# Patient Record
Sex: Female | Born: 1940
Health system: Southern US, Community
[De-identification: ages and names within clinical notes are randomized; demographics above are authoritative.]

## PROBLEM LIST (undated history)

## (undated) DIAGNOSIS — R42 Dizziness and giddiness: Secondary | ICD-10-CM

## (undated) DIAGNOSIS — Z124 Encounter for screening for malignant neoplasm of cervix: Secondary | ICD-10-CM

## (undated) DIAGNOSIS — M1990 Unspecified osteoarthritis, unspecified site: Secondary | ICD-10-CM

## (undated) DIAGNOSIS — L309 Dermatitis, unspecified: Secondary | ICD-10-CM

## (undated) DIAGNOSIS — D494 Neoplasm of unspecified behavior of bladder: Secondary | ICD-10-CM

## (undated) DIAGNOSIS — M542 Cervicalgia: Secondary | ICD-10-CM

## (undated) DIAGNOSIS — C679 Malignant neoplasm of bladder, unspecified: Secondary | ICD-10-CM

## (undated) DIAGNOSIS — I781 Nevus, non-neoplastic: Secondary | ICD-10-CM

## (undated) DIAGNOSIS — R1310 Dysphagia, unspecified: Secondary | ICD-10-CM

## (undated) DIAGNOSIS — R7303 Prediabetes: Secondary | ICD-10-CM

## (undated) DIAGNOSIS — K429 Umbilical hernia without obstruction or gangrene: Secondary | ICD-10-CM

## (undated) DIAGNOSIS — M8580 Other specified disorders of bone density and structure, unspecified site: Secondary | ICD-10-CM

## (undated) DIAGNOSIS — K5790 Diverticulosis of intestine, part unspecified, without perforation or abscess without bleeding: Secondary | ICD-10-CM

## (undated) DIAGNOSIS — M4800 Spinal stenosis, site unspecified: Secondary | ICD-10-CM

## (undated) DIAGNOSIS — N649 Disorder of breast, unspecified: Secondary | ICD-10-CM

## (undated) DIAGNOSIS — F418 Other specified anxiety disorders: Secondary | ICD-10-CM

## (undated) DIAGNOSIS — R002 Palpitations: Secondary | ICD-10-CM

## (undated) DIAGNOSIS — Z972 Presence of dental prosthetic device (complete) (partial): Secondary | ICD-10-CM

## (undated) DIAGNOSIS — M25552 Pain in left hip: Secondary | ICD-10-CM

## (undated) DIAGNOSIS — E782 Mixed hyperlipidemia: Secondary | ICD-10-CM

## (undated) DIAGNOSIS — L409 Psoriasis, unspecified: Secondary | ICD-10-CM

## (undated) DIAGNOSIS — R350 Frequency of micturition: Secondary | ICD-10-CM

## (undated) DIAGNOSIS — M545 Low back pain: Secondary | ICD-10-CM

## (undated) DIAGNOSIS — B019 Varicella without complication: Secondary | ICD-10-CM

## (undated) DIAGNOSIS — I10 Essential (primary) hypertension: Secondary | ICD-10-CM

## (undated) DIAGNOSIS — E663 Overweight: Secondary | ICD-10-CM

## (undated) DIAGNOSIS — R1011 Right upper quadrant pain: Secondary | ICD-10-CM

## (undated) DIAGNOSIS — K219 Gastro-esophageal reflux disease without esophagitis: Secondary | ICD-10-CM

## (undated) DIAGNOSIS — K449 Diaphragmatic hernia without obstruction or gangrene: Secondary | ICD-10-CM

## (undated) DIAGNOSIS — R739 Hyperglycemia, unspecified: Secondary | ICD-10-CM

## (undated) HISTORY — DX: Umbilical hernia without obstruction or gangrene: K42.9

## (undated) HISTORY — DX: Varicella without complication: B01.9

## (undated) HISTORY — PX: EYE SURGERY: SHX253

## (undated) HISTORY — DX: Palpitations: R00.2

## (undated) HISTORY — DX: Gastro-esophageal reflux disease without esophagitis: K21.9

## (undated) HISTORY — DX: Cervicalgia: M54.2

## (undated) HISTORY — DX: Diaphragmatic hernia without obstruction or gangrene: K44.9

## (undated) HISTORY — DX: Dizziness and giddiness: R42

## (undated) HISTORY — DX: Essential (primary) hypertension: I10

## (undated) HISTORY — DX: Dysphagia, unspecified: R13.10

## (undated) HISTORY — DX: Spinal stenosis, site unspecified: M48.00

## (undated) HISTORY — PX: CYSTECTOMY: SUR359

## (undated) HISTORY — PX: ABDOMINAL SURGERY: SHX537

## (undated) HISTORY — DX: Disorder of breast, unspecified: N64.9

## (undated) HISTORY — DX: Prediabetes: R73.03

## (undated) HISTORY — PX: COLONOSCOPY: SHX174

## (undated) HISTORY — DX: Encounter for screening for malignant neoplasm of cervix: Z12.4

## (undated) HISTORY — DX: Mixed hyperlipidemia: E78.2

## (undated) HISTORY — DX: Other specified disorders of bone density and structure, unspecified site: M85.80

## (undated) HISTORY — DX: Other specified anxiety disorders: F41.8

## (undated) HISTORY — DX: Frequency of micturition: R35.0

## (undated) HISTORY — DX: Hyperglycemia, unspecified: R73.9

## (undated) HISTORY — PX: BREAST CYST ASPIRATION: SHX578

## (undated) HISTORY — DX: Right upper quadrant pain: R10.11

## (undated) HISTORY — DX: Low back pain: M54.5

## (undated) HISTORY — PX: CATARACT EXTRACTION, BILATERAL: SHX1313

## (undated) HISTORY — DX: Overweight: E66.3

## (undated) HISTORY — DX: Pain in left hip: M25.552

## (undated) HISTORY — DX: Dermatitis, unspecified: L30.9

---

## 1968-08-27 HISTORY — PX: TUBAL LIGATION: SHX77

## 1968-08-27 HISTORY — PX: BREAST BIOPSY: SHX20

## 1978-08-27 HISTORY — PX: BREAST BIOPSY: SHX20

## 1998-05-10 ENCOUNTER — Ambulatory Visit (HOSPITAL_COMMUNITY): Admission: RE | Admit: 1998-05-10 | Discharge: 1998-05-10 | Payer: Self-pay

## 1999-11-30 ENCOUNTER — Other Ambulatory Visit: Admission: RE | Admit: 1999-11-30 | Discharge: 1999-11-30 | Payer: Self-pay | Admitting: Obstetrics and Gynecology

## 1999-12-06 ENCOUNTER — Encounter: Payer: Self-pay | Admitting: Obstetrics and Gynecology

## 1999-12-06 ENCOUNTER — Ambulatory Visit (HOSPITAL_COMMUNITY): Admission: RE | Admit: 1999-12-06 | Discharge: 1999-12-06 | Payer: Self-pay | Admitting: Obstetrics and Gynecology

## 2000-01-31 ENCOUNTER — Ambulatory Visit (HOSPITAL_COMMUNITY): Admission: RE | Admit: 2000-01-31 | Discharge: 2000-01-31 | Payer: Self-pay | Admitting: Gastroenterology

## 2001-01-10 ENCOUNTER — Other Ambulatory Visit: Admission: RE | Admit: 2001-01-10 | Discharge: 2001-01-10 | Payer: Self-pay | Admitting: Obstetrics and Gynecology

## 2002-01-12 ENCOUNTER — Other Ambulatory Visit: Admission: RE | Admit: 2002-01-12 | Discharge: 2002-01-12 | Payer: Self-pay | Admitting: Obstetrics and Gynecology

## 2003-04-15 ENCOUNTER — Other Ambulatory Visit: Admission: RE | Admit: 2003-04-15 | Discharge: 2003-04-15 | Payer: Self-pay | Admitting: Obstetrics and Gynecology

## 2004-10-23 ENCOUNTER — Other Ambulatory Visit: Admission: RE | Admit: 2004-10-23 | Discharge: 2004-10-23 | Payer: Self-pay | Admitting: Obstetrics and Gynecology

## 2004-10-26 ENCOUNTER — Encounter: Admission: RE | Admit: 2004-10-26 | Discharge: 2004-10-26 | Payer: Self-pay | Admitting: Obstetrics and Gynecology

## 2005-02-14 ENCOUNTER — Ambulatory Visit (HOSPITAL_COMMUNITY): Admission: RE | Admit: 2005-02-14 | Discharge: 2005-02-14 | Payer: Self-pay | Admitting: Gastroenterology

## 2005-02-14 LAB — HM COLONOSCOPY

## 2005-11-21 ENCOUNTER — Encounter: Admission: RE | Admit: 2005-11-21 | Discharge: 2005-11-21 | Payer: Self-pay | Admitting: Obstetrics and Gynecology

## 2006-12-26 ENCOUNTER — Encounter: Admission: RE | Admit: 2006-12-26 | Discharge: 2006-12-26 | Payer: Self-pay | Admitting: Obstetrics and Gynecology

## 2007-03-17 IMAGING — MG MM MAMMO SCREENING
1 series · 1 of 1 positions shown · non-contrast
Comparison: none

SCREENING MAMMOGRAM:
There is a  dense fibroglandular pattern.  No masses or malignant type calcifications are 
identified.  Compared with prior studies.

[R MLO]
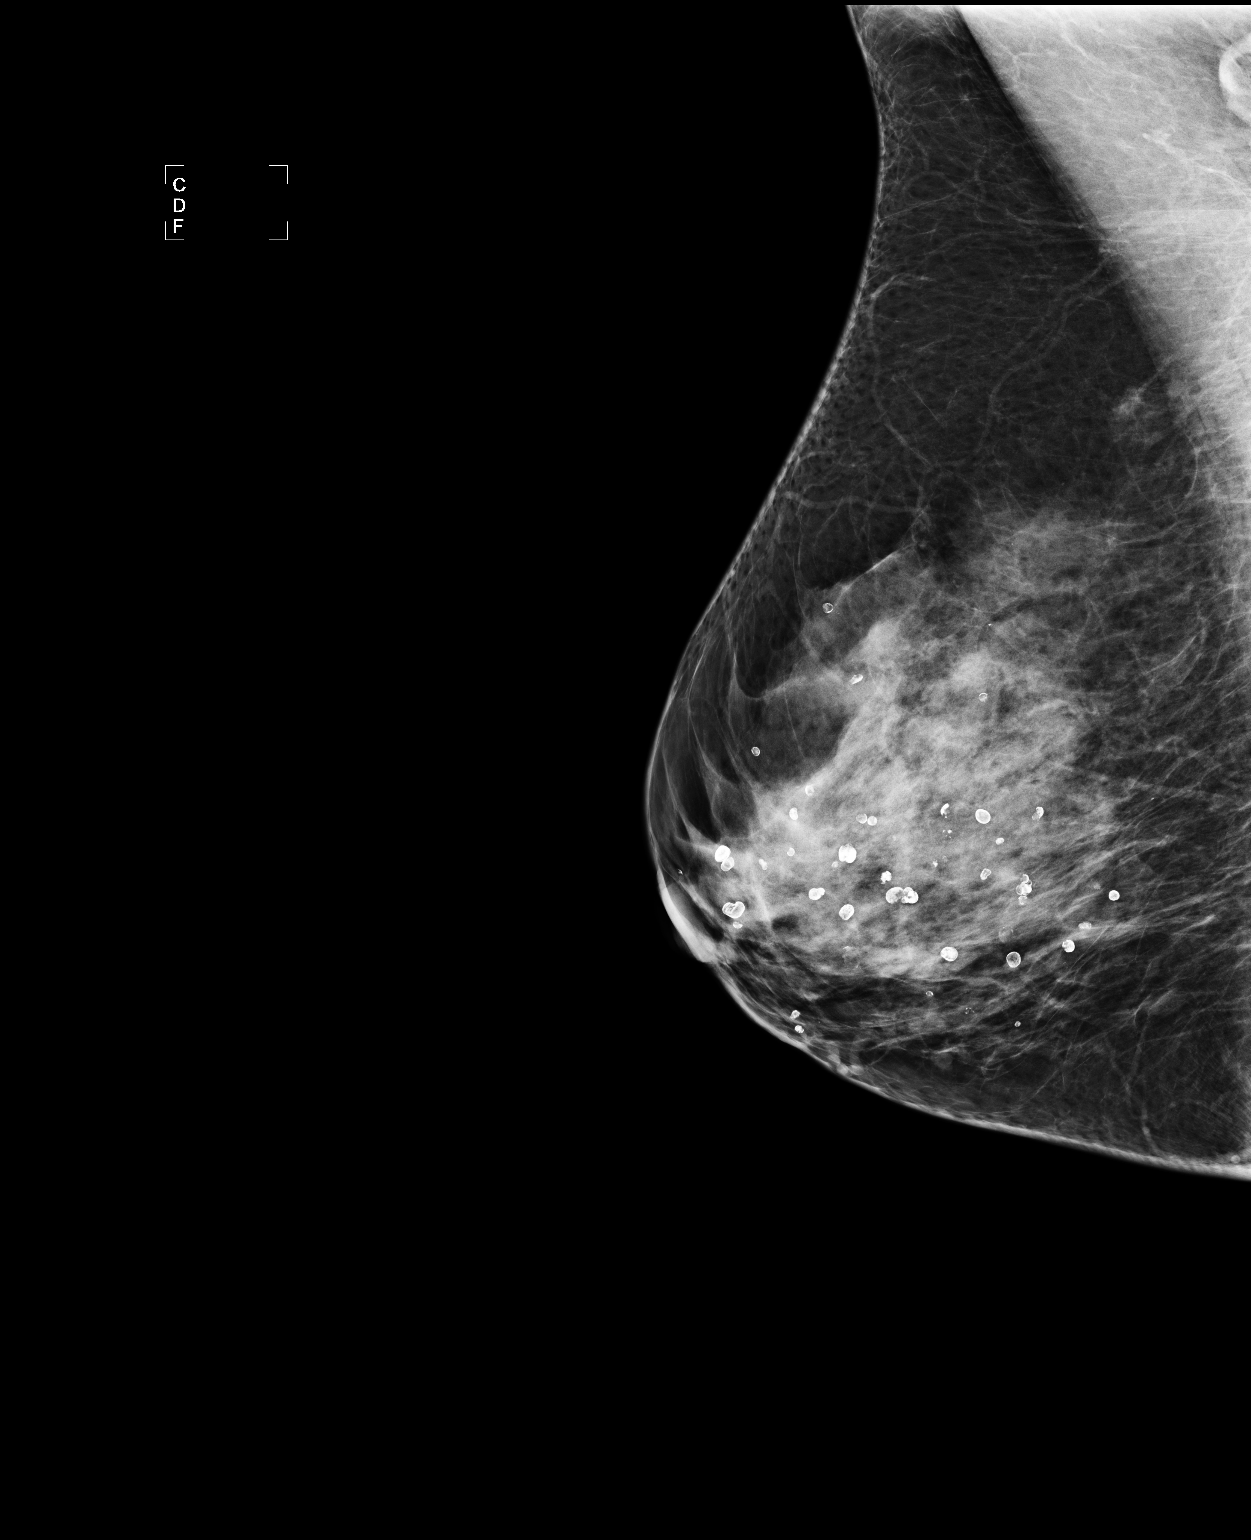

[1 of 1 positions shown; findings below may reference images not displayed]

IMPRESSION: No specific mammographic evidence of malignancy.  Next screening mammogram is recommended in one 
year.

ASSESSMENT: Negative - BI-RADS 1

Screening mammogram in 1 year.

## 2007-03-28 LAB — CONVERTED CEMR LAB

## 2007-04-18 ENCOUNTER — Emergency Department (HOSPITAL_COMMUNITY): Admission: EM | Admit: 2007-04-18 | Discharge: 2007-04-18 | Payer: Self-pay | Admitting: Emergency Medicine

## 2007-05-15 ENCOUNTER — Encounter: Admission: RE | Admit: 2007-05-15 | Discharge: 2007-05-15 | Payer: Self-pay | Admitting: Family Medicine

## 2007-10-13 ENCOUNTER — Encounter: Admission: RE | Admit: 2007-10-13 | Discharge: 2007-10-13 | Payer: Self-pay | Admitting: Family Medicine

## 2007-10-13 ENCOUNTER — Ambulatory Visit: Payer: Self-pay | Admitting: Family Medicine

## 2007-10-13 DIAGNOSIS — I10 Essential (primary) hypertension: Secondary | ICD-10-CM

## 2007-10-13 HISTORY — DX: Essential (primary) hypertension: I10

## 2007-10-30 ENCOUNTER — Ambulatory Visit: Payer: Self-pay | Admitting: Family Medicine

## 2007-10-31 ENCOUNTER — Telehealth: Payer: Self-pay | Admitting: Family Medicine

## 2007-11-13 ENCOUNTER — Ambulatory Visit: Payer: Self-pay | Admitting: Family Medicine

## 2008-02-24 ENCOUNTER — Ambulatory Visit: Payer: Self-pay | Admitting: Family Medicine

## 2008-02-24 DIAGNOSIS — M79609 Pain in unspecified limb: Secondary | ICD-10-CM | POA: Insufficient documentation

## 2008-02-27 LAB — CONVERTED CEMR LAB
Folate: 15.4 ng/mL
HCT: 44.2 % (ref 36.0–46.0)
Hemoglobin: 14.2 g/dL (ref 12.0–15.0)
MCHC: 32.1 g/dL (ref 30.0–36.0)
MCV: 94.2 fL (ref 78.0–100.0)
Platelets: 241 10*3/uL (ref 150–400)
RBC: 4.69 M/uL (ref 3.87–5.11)
RDW: 14 % (ref 11.5–15.5)
TSH: 1.975 microintl units/mL (ref 0.350–5.50)
Vit D, 1,25-Dihydroxy: 29 — ABNORMAL LOW (ref 30–89)
Vitamin B-12: 683 pg/mL (ref 211–911)
WBC: 5.1 10*3/uL (ref 4.0–10.5)

## 2008-03-18 ENCOUNTER — Ambulatory Visit: Payer: Self-pay | Admitting: Family Medicine

## 2008-03-18 DIAGNOSIS — F39 Unspecified mood [affective] disorder: Secondary | ICD-10-CM | POA: Insufficient documentation

## 2008-03-18 DIAGNOSIS — R002 Palpitations: Secondary | ICD-10-CM | POA: Insufficient documentation

## 2008-03-18 LAB — CONVERTED CEMR LAB: Blood Glucose, Fasting: 102 mg/dL

## 2008-05-18 ENCOUNTER — Encounter: Admission: RE | Admit: 2008-05-18 | Discharge: 2008-05-18 | Payer: Self-pay | Admitting: Family Medicine

## 2008-05-26 ENCOUNTER — Other Ambulatory Visit: Admission: RE | Admit: 2008-05-26 | Discharge: 2008-05-26 | Payer: Self-pay | Admitting: Family Medicine

## 2008-05-26 ENCOUNTER — Encounter: Payer: Self-pay | Admitting: Family Medicine

## 2008-05-26 ENCOUNTER — Ambulatory Visit: Payer: Self-pay | Admitting: Family Medicine

## 2008-05-27 LAB — CONVERTED CEMR LAB
ALT: 13 units/L (ref 0–35)
AST: 20 units/L (ref 0–37)
Albumin: 4.3 g/dL (ref 3.5–5.2)
Alkaline Phosphatase: 70 units/L (ref 39–117)
BUN: 8 mg/dL (ref 6–23)
CO2: 24 meq/L (ref 19–32)
Calcium: 9.7 mg/dL (ref 8.4–10.5)
Chloride: 104 meq/L (ref 96–112)
Cholesterol: 173 mg/dL (ref 0–200)
Creatinine, Ser: 0.92 mg/dL (ref 0.40–1.20)
Glucose, Bld: 103 mg/dL — ABNORMAL HIGH (ref 70–99)
HDL: 62 mg/dL (ref >39)
LDL Cholesterol: 94 mg/dL (ref 0–99)
Potassium: 4 meq/L (ref 3.5–5.3)
Sodium: 141 meq/L (ref 135–145)
TSH: 1.609 microintl units/mL (ref 0.350–4.50)
Total Bilirubin: 0.8 mg/dL (ref 0.3–1.2)
Total CHOL/HDL Ratio: 2.8
Total Protein: 6.8 g/dL (ref 6.0–8.3)
Triglycerides: 87 mg/dL (ref <150)
VLDL: 17 mg/dL (ref 0–40)

## 2008-08-27 LAB — HM PAP SMEAR

## 2008-08-29 ENCOUNTER — Emergency Department (HOSPITAL_BASED_OUTPATIENT_CLINIC_OR_DEPARTMENT_OTHER): Admission: EM | Admit: 2008-08-29 | Discharge: 2008-08-29 | Payer: Self-pay | Admitting: Emergency Medicine

## 2008-09-10 ENCOUNTER — Ambulatory Visit: Payer: Self-pay | Admitting: Family Medicine

## 2008-09-13 ENCOUNTER — Encounter: Payer: Self-pay | Admitting: Family Medicine

## 2008-09-13 ENCOUNTER — Telehealth: Payer: Self-pay | Admitting: Family Medicine

## 2008-09-13 ENCOUNTER — Ambulatory Visit: Payer: Self-pay | Admitting: *Deleted

## 2008-09-13 ENCOUNTER — Ambulatory Visit (HOSPITAL_COMMUNITY): Admission: RE | Admit: 2008-09-13 | Discharge: 2008-09-13 | Payer: Self-pay | Admitting: Family Medicine

## 2008-09-22 ENCOUNTER — Telehealth: Payer: Self-pay | Admitting: Family Medicine

## 2008-09-24 ENCOUNTER — Telehealth: Payer: Self-pay | Admitting: Family Medicine

## 2008-09-27 ENCOUNTER — Telehealth: Payer: Self-pay | Admitting: Family Medicine

## 2008-10-07 ENCOUNTER — Ambulatory Visit: Payer: Self-pay | Admitting: Family Medicine

## 2008-10-07 DIAGNOSIS — I8310 Varicose veins of unspecified lower extremity with inflammation: Secondary | ICD-10-CM | POA: Insufficient documentation

## 2008-10-07 DIAGNOSIS — E663 Overweight: Secondary | ICD-10-CM | POA: Insufficient documentation

## 2008-10-07 DIAGNOSIS — E669 Obesity, unspecified: Secondary | ICD-10-CM | POA: Insufficient documentation

## 2008-10-07 HISTORY — DX: Overweight: E66.3

## 2008-10-14 ENCOUNTER — Encounter: Payer: Self-pay | Admitting: Family Medicine

## 2008-10-18 ENCOUNTER — Encounter: Payer: Self-pay | Admitting: Family Medicine

## 2008-11-08 ENCOUNTER — Telehealth: Payer: Self-pay | Admitting: Family Medicine

## 2008-11-10 ENCOUNTER — Telehealth: Payer: Self-pay | Admitting: Family Medicine

## 2008-12-09 ENCOUNTER — Telehealth (INDEPENDENT_AMBULATORY_CARE_PROVIDER_SITE_OTHER): Payer: Self-pay | Admitting: *Deleted

## 2009-02-05 IMAGING — CR DG CHEST 2V
2 series · 2 of 2 positions shown · non-contrast
Comparison: None.

CLINICAL DATA: Cough.
 CHEST - 2 VIEW:

[view not recorded (1 of 2)]
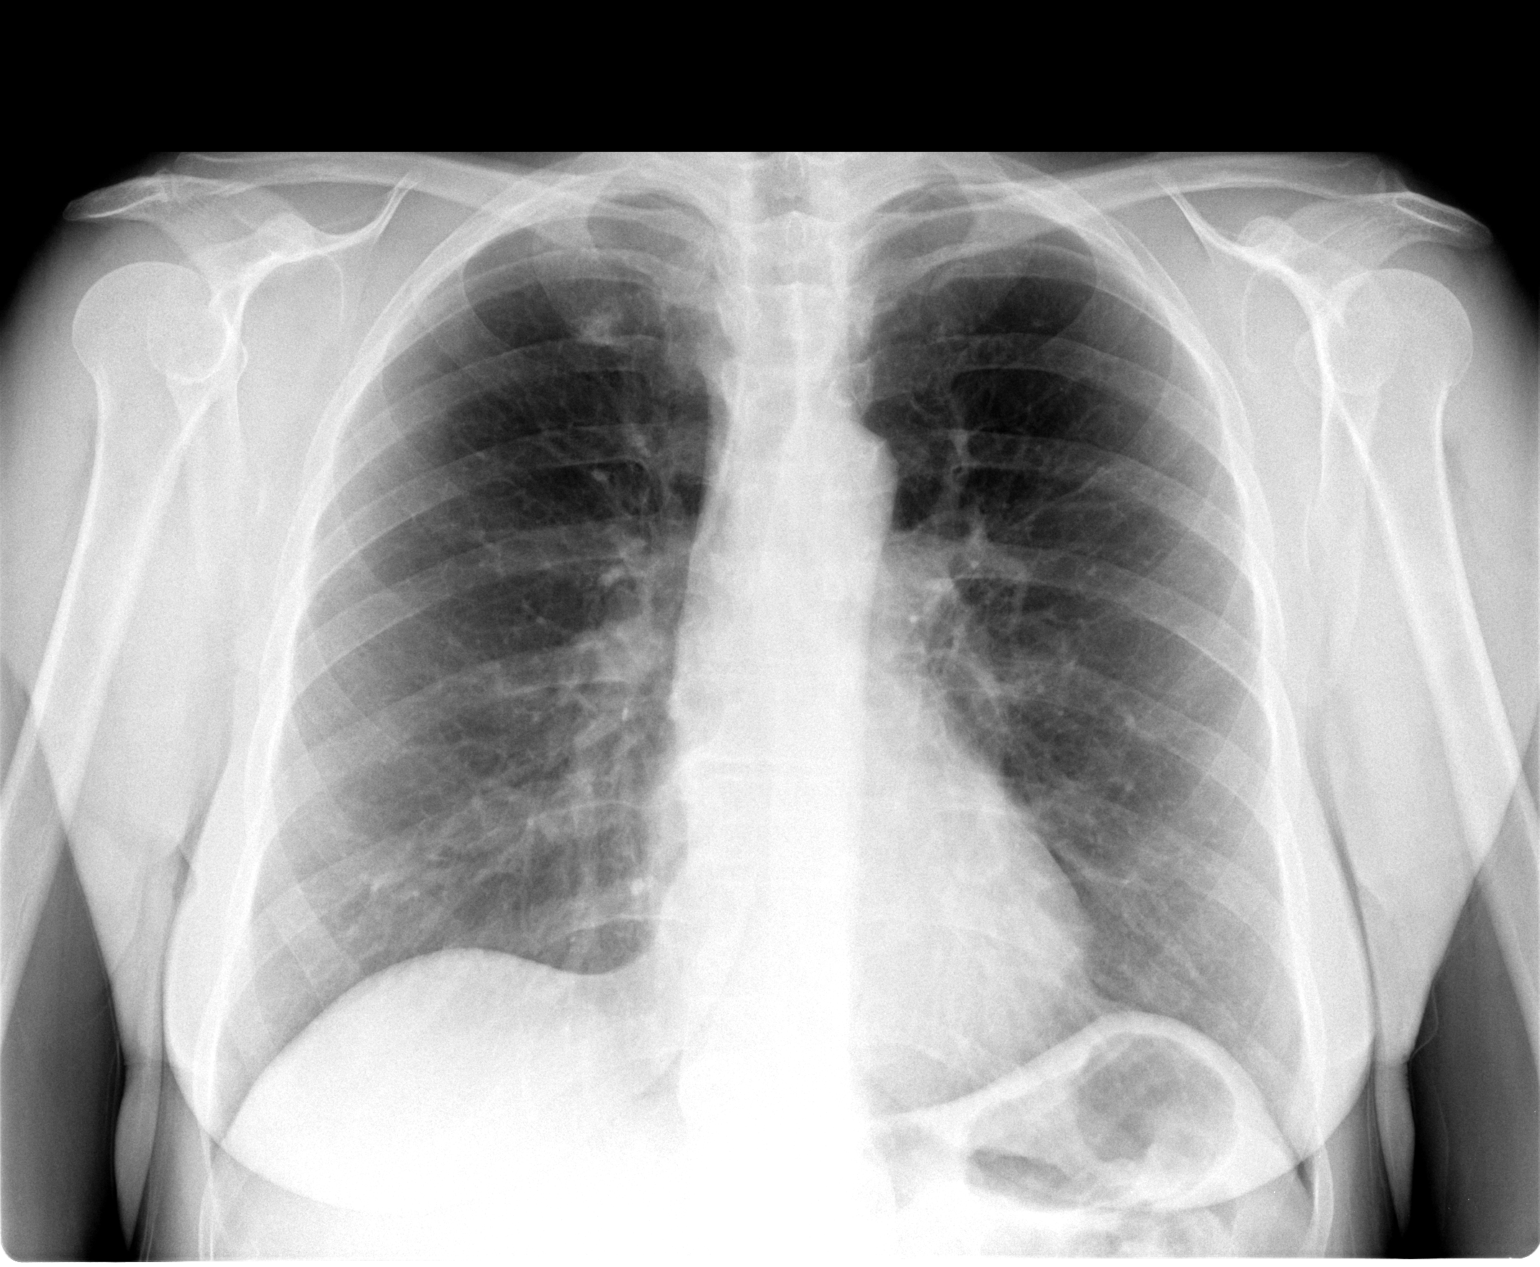

[view not recorded (2 of 2)]
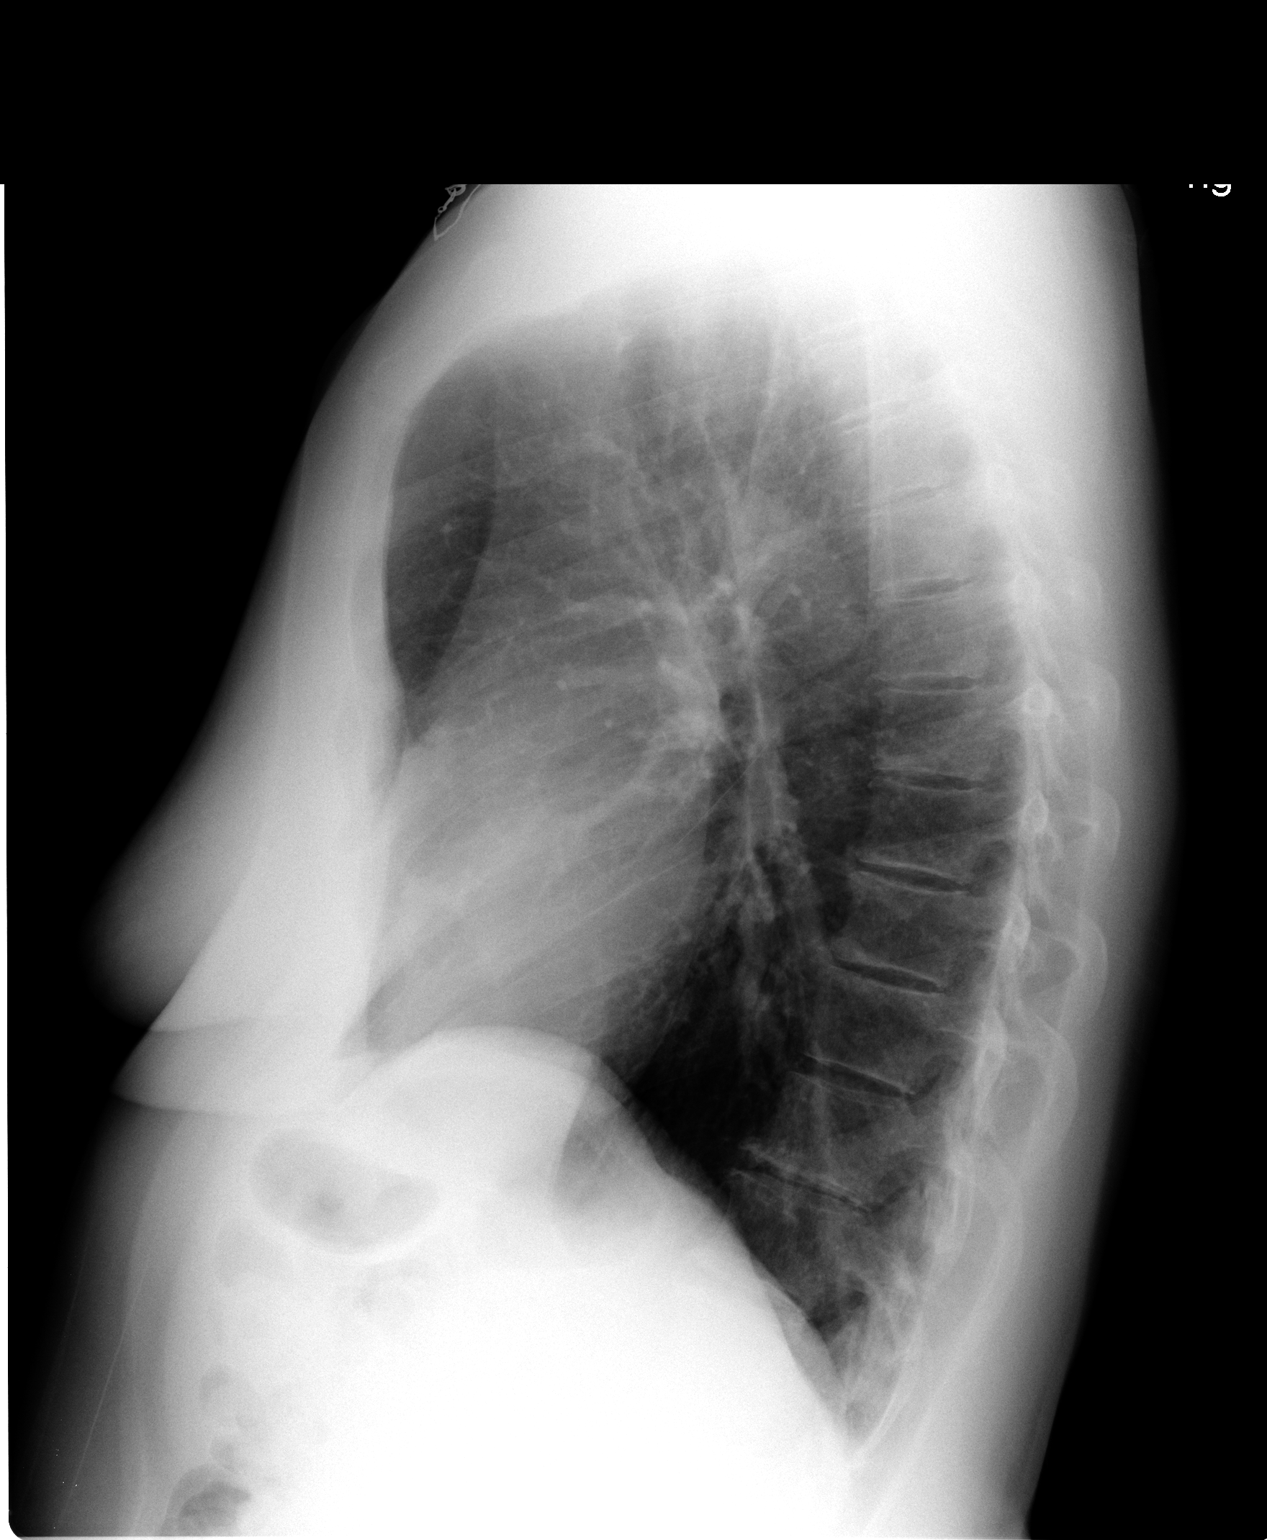

[2 of 2 positions shown; findings below may reference images not displayed]

FINDINGS: Trachea is midline.  Heart size normal.  Lungs are clear.  No pleural fluid.   Mild pectus deformity.
IMPRESSION: No acute findings.

## 2009-05-23 ENCOUNTER — Encounter: Admission: RE | Admit: 2009-05-23 | Discharge: 2009-05-23 | Payer: Self-pay | Admitting: Family Medicine

## 2009-06-07 ENCOUNTER — Ambulatory Visit: Payer: Self-pay | Admitting: Family Medicine

## 2009-06-07 DIAGNOSIS — M899 Disorder of bone, unspecified: Secondary | ICD-10-CM | POA: Insufficient documentation

## 2009-06-07 DIAGNOSIS — M949 Disorder of cartilage, unspecified: Secondary | ICD-10-CM

## 2009-06-07 DIAGNOSIS — F418 Other specified anxiety disorders: Secondary | ICD-10-CM | POA: Insufficient documentation

## 2009-06-07 HISTORY — DX: Other specified anxiety disorders: F41.8

## 2009-06-08 LAB — CONVERTED CEMR LAB
ALT: 14 units/L (ref 0–35)
AST: 19 units/L (ref 0–37)
Albumin: 4.2 g/dL (ref 3.5–5.2)
Alkaline Phosphatase: 69 units/L (ref 39–117)
BUN: 15 mg/dL (ref 6–23)
CO2: 25 meq/L (ref 19–32)
Calcium: 10.1 mg/dL (ref 8.4–10.5)
Chloride: 107 meq/L (ref 96–112)
Creatinine, Ser: 0.8 mg/dL (ref 0.40–1.20)
Glucose, Bld: 78 mg/dL (ref 70–99)
Potassium: 4.3 meq/L (ref 3.5–5.3)
Sodium: 142 meq/L (ref 135–145)
TSH: 2.806 microintl units/mL (ref 0.350–4.500)
Total Bilirubin: 0.8 mg/dL (ref 0.3–1.2)
Total Protein: 6.4 g/dL (ref 6.0–8.3)

## 2009-06-28 ENCOUNTER — Encounter: Admission: RE | Admit: 2009-06-28 | Discharge: 2009-06-28 | Payer: Self-pay | Admitting: Family Medicine

## 2009-06-28 ENCOUNTER — Telehealth: Payer: Self-pay | Admitting: Family Medicine

## 2009-06-28 ENCOUNTER — Ambulatory Visit: Payer: Self-pay | Admitting: Family Medicine

## 2009-08-10 ENCOUNTER — Ambulatory Visit: Payer: Self-pay | Admitting: Family Medicine

## 2010-02-07 ENCOUNTER — Ambulatory Visit: Payer: Self-pay | Admitting: Family Medicine

## 2010-03-21 ENCOUNTER — Telehealth: Payer: Self-pay | Admitting: Family Medicine

## 2010-06-07 ENCOUNTER — Encounter: Admission: RE | Admit: 2010-06-07 | Discharge: 2010-06-07 | Payer: Self-pay | Admitting: Family Medicine

## 2010-06-09 LAB — HM MAMMOGRAPHY: HM Mammogram: NORMAL

## 2010-07-13 ENCOUNTER — Telehealth: Payer: Self-pay | Admitting: Family Medicine

## 2010-09-26 NOTE — Assessment & Plan Note (Signed)
Summary: 6 WEEK FU Mood   Vital Signs:  Patient profile:   70 year old female Height:      65.1 inches Weight:      180 pounds Pulse rate:   69 / minute BP sitting:   130 / 73  (left arm) Cuff size:   regular  Vitals Entered By: Kathlene November (August 10, 2009 10:13 AM) CC: follow-up BP   Primary Care Provider:  Linford Arnold, C  CC:  follow-up BP.  History of Present Illness: Did go up  to 1.5 tabs but didn't feel it helped with her mood. Overall feels better on the medication but doesn't feel like the increase to 30mg  made a difference so he dropped back down to 20mg .  Did tolerated the higher dose with no side effects.  Still really coping his her social situation. NOt interested in counseling.    Current Medications (verified): 1)  Alprazolam 0.5 Mg Tabs (Alprazolam) .[name].. 1-2 Tabs By Mouth Daily 2)  Cozaar 50 Mg Tabs (Losartan Potassium) .... Take 1 Tablet By Mouth Once A Day (Generic Please) 3)  Vitamin D 60454 Unit  Caps (Ergocalciferol) .... One By Mouth Once A Week For 6 Months. 4)  Citalopram Hydrobromide 20 Mg Tabs (Citalopram Hydrobromide) .... Take 1 Tablet By Mouth Once A Day  Allergies (verified): 1)  Hydrochlorothiazide 2)  Lisinopril 3)  * Bisphosphonates.  Comments:  Nurse/Medical Assistant: The patient's medications and allergies were reviewed with the patient and were updated in the Medication and Allergy Lists. Kathlene November (August 10, 2009 10:13 AM)  Physical Exam  General:  Well-developed,well-nourished,in no acute distress; alert,appropriate and cooperative throughout examination Head:  Normocephalic and atraumatic without obvious abnormalities. No apparent alopecia or balding. Lungs:  Normal respiratory effort, chest expands symmetrically. Lungs are clear to auscultation, no crackles or wheezes. Heart:  Normal rate and regular rhythm. S1 and S2 normal without gallop, murmur, click, rub or other extra sounds. Skin:  no rashes.   Psych:  Cognition and  judgment appear intact. Alert and cooperative with normal attention span and concentration. No apparent delusions, illusions, hallucinations   Impression & Recommendations:  Problem # 1:  ESSENTIAL HYPERTENSION, BENIGN (ICD-401.1)  Looks great.  Her updated medication list for this problem includes:    Cozaar 50 Mg Tabs (Losartan potassium) .[name]... Take 1 tablet by mouth once a day (generic please)  BP today: 130/73 Prior BP: 129/66 (06/28/2009)  Prior 10 Yr Risk Heart Disease: 5 % (06/28/2009)  Labs Reviewed: K+: 4.3 (06/07/2009) Creat: : 0.80 (06/07/2009)   Chol: 173 (05/26/2008)   HDL: 62 (05/26/2008)   LDL: 94 (05/26/2008)   TG: 87 (05/26/2008)  Problem # 2:  DEPRESSION (ICD-311) Discussed  trial of increasing to 40mg  since toelrated the 30 mg well. Explained that I really want the med to really benefit her so really want to try to reduce as many of her sxs as possible.   Fu in one moths. Also reviewed that med has to be tapered when she is ready to stop it.  She is aware.  She hopes she can come off it soon, but I did encourage her to take it slowly as coming off too early can cause her sxs of depression to come back. Didn't repeat in her PHQ-9 today.  Her updated medication list for this problem includes:    Alprazolam 0.5 Mg Tabs (Alprazolam) .[name]... 1-2 tabs by mouth daily    Citalopram Hydrobromide 20 Mg Tabs (Citalopram hydrobromide) .[name]... Take 1 tablet  by mouth once a day  Complete Medication List: 1)  Alprazolam 0.5 Mg Tabs (Alprazolam) .[name].. 1-2 tabs by mouth daily 2)  Cozaar 50 Mg Tabs (Losartan potassium) .... Take 1 tablet by mouth once a day (generic please) 3)  Vitamin D [address] Unit Caps (Ergocalciferol) .... One by mouth once a week for 6 months. 4)  Citalopram Hydrobromide 20 Mg Tabs (Citalopram hydrobromide) .... Take 1 tablet by mouth once a day

## 2010-09-26 NOTE — Progress Notes (Signed)
  Phone Note Refill Request   Refills Requested: Medication #1:  ALPRAZOLAM 0.5 MG TABS 1-2 tabs by mouth daily  Method Requested: Fax to Local Pharmacy Initial call taken by: Avon Gully CMA, Duncan Dull),  July 13, 2010 2:44 PM [facility]  [phone]*    Prescriptions: ALPRAZOLAM 0.5 MG TABS (ALPRAZOLAM) 1-2 tabs by mouth daily  #60 x 0   Entered and Authorized by:   Avon Gully CMA, (AAMA)   Signed by:   Avon Gully CMA, (AAMA) on 07/13/2010   Method used:   Printed then faxed to ...       [facility]  [address] [phone][facility] ([facility]ail)       [address] [place]  [address]       Ph: [phone] or [phone]       Fax: [phone]   RxID:   [id number]   Appended Document:     Clinical Lists Changes  Medications: Rx of ALPRAZOLAM 0.5 MG TABS (ALPRAZOLAM) 1-2 tabs by mouth daily;  #60 x 0;  Signed;  Entered by: Avon Gully CMA, (AAMA);  Authorized by: Nani Gasser MD;  Method used: [facility] [address] [place], [place], [place]  [address], Ph: [phone] or [phone], Fax: [phone]    Prescriptions: ALPRAZOLAM 0.5 MG TABS (ALPRAZOLAM) 1-2 tabs by mouth daily  #60 x 0   Entered by:   Avon Gully CMA, (AAMA)   Authorized by:   Nani Gasser MD   Signed by:   Avon Gully CMA, (AAMA) on 07/13/2010   Method used:   Printed then faxed to ...       [facility]  [address] [phone]* (retail)       [address]       [place], [place]  [address]       Ph: [phone] or [phone]       Fax: [phone]   RxID:   [id number]

## 2010-09-26 NOTE — Assessment & Plan Note (Signed)
Summary: Christina [name]; BP can't regulate it   Vital Signs:  Patient Profile:   70 Years Old Female Height:     65.1 inches Weight:      179 pounds Pulse rate:   75 / minute BP sitting:   129 / 69  (left arm) Cuff size:   large  Vitals Entered By: Kathlene November (October 13, 2007 10:50 AM)                 PCP:  Cipriano Bunker  Chief Complaint:  NP- BP has been elevated at home. c/o dry cough alot.  History of Present Illness: [facility] laste [facility] It was 210/105 checked by the nurse.  Then went to ED. Then started on HCT and followed up with her MD.  Then started lisinopril and it caused cough.  Up until Friday BP 130-170.  Then over the weekend it was much better. Has been on Norvasc since November.  No weight loss. Still have a slight cough. Sometimes cough so hard her ribs hurt.  Dry.  No other ENT allergy symptoms. GEts HA when works out in the yard.  So was started on Allegra.  Does know she is salt sentive.   TAking Xanaxfor insomnia.  Takes half a Celexa for anxiety.   GEts CP and burning so takes Aciphex maybe once or twice every couple of months.   Wakes up every night with her hands tingling. Hands hurt just at night.  Occ wakes her up from sleep. She is side sleeper.  Happens nighlty.  Startedin the falll.   No pain or weakness during the day.  Cough started in the Fall around the same time as the elevated BP.  Pts says cough improve after stopped the ACE but it has still lingered. It is mild.  Denies any current SOB, CP or allergic symptoms. Mother died of Lung Ca. Last CXR in the summer/early fall.     Current Allergies: No known allergies   Past Surgical History:    Breast bx in 1960s adn 70s right and left    Tubal ligation 1970s    Cyst removed from abdomen 1980s   Family History:    Father with alcoholism    Mother wiht lung Ca    Sister with uterine Ca    Sister with DM, HTN    GM with stroke.   Social History:    REtired.   Widowed.      Former Smoker    Alcohol use-no    Drug use-no    Regular exercise-yes   Risk Factors:  Tobacco use:  quit    Year quit:  1999 Drug use:  no HIV high-risk behavior:  no Caffeine use:  3 drinks per day Alcohol use:  no Exercise:  yes    Times per week:  3    Type:  Walking   Family History Risk Factors:    Family History of MI in females < 62 years old:  no    Family History of MI in males < 33 years old:  no  Colonoscopy History:     Date of Last Colonoscopy:  08/27/2006    Results:  Unknown    Review of Systems       No fever/sweats/weakness, unexplained weight loss/gain.  + vison changes.  No difficulty hearing/ringing in ears, hay fever/allergies.  No chest pain/discomfort, palpitations.  No Br lump/nipple discharge.  + cough/wheeze.  No blood in BM,  nausea/vomiting/diarrhea.  No nighttime urination, leaking urine, unusual vaginal bleeding, discharge (penis or vagina).  No muscle/joint pain. No rash, change in mole.  No HA, memory loss.  No anxiety.  + sleep d/o.  No  depression.  No easy bruising/bleeding, unexplained lump    Physical Exam  General:     Well-developed,well-nourished,in no acute distress; alert,appropriate and cooperative throughout examination Head:     Normocephalic and atraumatic without obvious abnormalities. No apparent alopecia or balding. Eyes:     No corneal or conjunctival inflammation noted. EOMI. Perrla.  Ears:     External ear exam shows no significant lesions or deformities.  Otoscopic examination reveals clear canals, tympanic membranes are intact bilaterally without bulging, retraction, inflammation or discharge. Hearing is grossly normal bilaterally. Nose:     External nasal examination shows no deformity or inflammation. Nasal mucosa are pink and moist without lesions or exudates. Mouth:     Oral mucosa and oropharynx without lesions or exudates.  Teeth in good repair. Neck:     No deformities, masses, or tenderness  noted.  No TM.  Lungs:     Normal respiratory effort, chest expands symmetrically. Lungs are clear to auscultation, no crackles or wheezes. Heart:     Normal rate and regular rhythm. S1 and S2 normal without gallop, murmur, click, rub or other extra sounds. Pulses:     Radial 2+  Skin:     no rashes.   Cervical Nodes:     No lymphadenopathy noted    Impression & Recommendations:  Problem # 1:  ESSENTIAL HYPERTENSION, BENIGN (ICD-401.1) Looks well controlled today but since getting elevated numbers at home will increase Norvasc.  ACE caused cough. Can't afford ARBs.  Hasn't tried beta blocker.  Thinks she may have had her thyroid checked w/ her CPE.  avoid excess salt. Continue to watch her weight.  Her updated medication list for this problem includes:    Hydrochlorothiazide 25 Mg Tabs (Hydrochlorothiazide) .[name]... Take one tablet by mouth once a day    Norvasc 10 Mg Tabs (Amlodipine besylate) .[name]... Take 1 tablet by mouth once a day   Problem # 2:  COUGH (ICD-786.2) Better since off ACE but peristnat.  Since 2 weeks of coug without etiology and former smoker will check CXR.  Not likely allergies. Consider Reflux as cause.  Exam is normal.  Only takes her PPI every few months.  Orders: T-Chest x-ray, 2 views (71020)   Complete Medication List: 1)  Hydrochlorothiazide 25 Mg Tabs (Hydrochlorothiazide) .... Take one tablet by mouth once a day 2)  Norvasc 10 Mg Tabs (Amlodipine besylate) .... Take 1 tablet by mouth once a day 3)  Allegra 180 Mg Tabs (Fexofenadine hcl) .... Take one tablet by mouth once a day as needed 4)  Aciphex 20 Mg Tbec (Rabeprazole sodium) .... Take one tablet by mouth once a day as needed 5)  Celexa 20 Mg Tabs (Citalopram hydrobromide) .... Take 1/2 tablet by mouth once a day 6)  Alprazolam 0.5 Mg Tb24 (Alprazolam) .... Take one tablet by mouth once a day     Prescriptions: NORVASC 10 MG  TABS (AMLODIPINE BESYLATE) Take 1 tablet by mouth once a day  #30 x 2    Entered and Authorized by:   Nani Gasser MD   Signed by:   Nani Gasser MD on 10/13/2007   Method used:   Electron[facility] to ...       [facility]  [address] [phone][facility]  [address]       [place], [place]  [address]       Ph: [phone] or [phone]       Fax: [phone]   RxID:   [id number]  ]

## 2010-09-26 NOTE — Consult Note (Signed)
Summary: [facility]  [facility]   Imported By: Lanelle Bal 11/06/2008 10:02:51  _____________________________________________________________________  External Attachment:    Type:   Image     Comment:   External Document

## 2010-09-26 NOTE — Assessment & Plan Note (Signed)
Summary: Varicose Veins, HTN   Vital Signs:  Patient Profile:   70 Years Old Female Height:     65.1 inches Weight:      188 pounds Pulse rate:   78 / minute BP sitting:   129 / 67  (left arm) Cuff size:   regular  Vitals Entered By: Kathlene November (October 07, 2008 9:28 AM)                 PCP:  Cipriano Bunker  Chief Complaint:  right leg pain from the spider veins.  History of Present Illness: Still having pain in the right upper leg above the knee.  Painful along the edge. Has notw been a month of pain and swelling. Often painful after sitting for a period of time.  Feels like a stabbing pain.    Concerned about her weight.  Has been exercising adn has changed her diet.  Was trying to stay around 2000 calories for hteh alst month and has gained 2 pounds.   Benicar has gone up in price. Wants to know if there is anything cheaper. She has a cough with ACEi.     Current Allergies: HYDROCHLOROTHIAZIDE LISINOPRIL      Physical Exam  General:     Well-developed,well-nourished,in no acute distress; alert,appropriate and cooperative throughout examination Head:     Normocephalic and atraumatic without obvious abnormalities. No apparent alopecia or balding. Skin:     Right leg above the knee with erythema and large collection of varicose veins.  INcrased warmth.   Psych:     Cognition and judgment appear intact. Alert and cooperative with normal attention span and concentration. 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  It doesn't seem to be spreading but has been persistant for a month now.   Orders: Vascular Clinic (Vascular)   Problem # 2:  OBESITY (ICD-278.00) Discussed for weight loss will need to reduce her calorie count.  Reduce to 1600 calories. Continue daily  exercise for 20-30 minutes. Fu in one month is not albe to loss weight on this regimen.     Problem # 3:  ESSENTIAL HYPERTENSION, BENIGN (ICD-401.1) Discussed can increase Benicar to 40 mg and cut in half or can call her inusrance co and find out if there is one ARB that is preferred.  Her updated medication list for this problem includes:    Benicar 20 Mg Tabs (Olmesartan medoxomil) .[name]... Take 1 tablet by mouth once a day   Complete Medication List: 1)  Allegra 180 Mg Tabs (Fexofenadine hcl) .... Take one tablet by mouth once a day as needed 2)  Aciphex 20 Mg Tbec (Rabeprazole sodium) .... Take one tablet by mouth once a day as needed 3)  Celexa 20 Mg Tabs (Citalopram hydrobromide) .... Take 1/2 tablet by mouth once a day 4)  Alprazolam 0.5 Mg Tabs (Alprazolam) .[name].. 1-2 tabs by mouth daily 5)  Benicar 20 Mg Tabs (Olmesartan medoxomil) .... Take 1 tablet by mouth once a day 6)  Vitamin D 40981 Unit Caps (Ergocalciferol) .... One by mouth once a week for 6 months. 7)  Requip 0.25 Mg Tabs (Ropinirole hcl) .... Take 1 tablet by mouth once a day fin the evening for 2 days, then 2 tabs nightly for one week   Patient Instructions: 1)  Cut calories  to 1600 a day. 2)  Exercise for 5 days a week for 20-30 minutes.   3)  Call me about the Benicar.

## 2010-09-26 NOTE — Progress Notes (Signed)
Summary: about her shot  Phone Note Call from Patient   Caller: Patient Summary of Call: Dr.Metheney  Patient said she forgot to tell you her Last Tet Shot was 01-31-2003. Initial call taken by: Vanessa Swazil[place],  June 28, 2009 9:00 AM      Immunization History:  Tetanus/Td Immunization History:    Tetanus/Td:  historical (01/31/2003)

## 2010-09-26 NOTE — Miscellaneous (Signed)
Summary: Flu Shot/[place] Kathryne Sharper  Flu Shot/[place] Kathryne Sharper   Imported By: Lanelle Bal 06/15/2009 13:46:13  _____________________________________________________________________  External Attachment:    Type:   Image     Comment:   External Document

## 2010-09-26 NOTE — Assessment & Plan Note (Signed)
Summary: f/u on meds- jr   Vital Signs:  Patient profile:   70 year old female Height:      65.1 inches Weight:      186 pounds BMI:     30.97 O2 Sat:      97 % on Room air Pulse rate:   72 / minute BP sitting:   141 / 72  (left arm) Cuff size:   large  Vitals Entered By: Payton Spark CMA (February 07, 2010 9:46 AM)  O2 Flow:  Room air CC: F/u mood.   History of Present Illness: 70 yo woman here today for f/u on mood.  taking Xanax nightly for sleep.  mood has been 'ok' since stopping Celexa.  no thoughts of hopelessness or despair.  BP- reports nervous about meeting new doctor, drank a lot of coffee this AM.  BP yesterday was 120/60.  no CP, SOB, HAs, visual changes, edema.  2 weeks ago had severe epigastric pain which improved w/ Aciphex.  heart palpitation- 'it's like my heart stops and then starts again'.  occurs when pt is 'still'.  occurs 2-3x every other week.  present for 1 yr or longer.  was reassured by multiple docs but wanted to know what i thought.  it worries pt.   Problems Prior to Update: 1)  Depression  (ICD-311) 2)  Osteopenia  (ICD-733.90) 3)  Preventive Health Care  (ICD-V70.0) 4)  Obesity  (ICD-278.00) 5)  Varicose Veins Lower Extremities W/inflammation  (ICD-454.1) 6)  Palpitations, Occasional  (ICD-785.1) 7)  Unspecified Episodic Mood Disorder  (ICD-296.90) 8)  Leg Pain  (ICD-729.5) 9)  Essential Hypertension, Benign  (ICD-401.1)  Current Medications (verified): 1)  Alprazolam 0.5 Mg Tabs (Alprazolam) .[name].. 1-2 Tabs By Mouth Daily 2)  Cozaar 50 Mg Tabs (Losartan Potassium) .... Take 1 Tablet By Mouth Once A Day (Generic Please) 3)  Vitamin D 32440 Unit  Caps (Ergocalciferol) .... One By Mouth Once A Week For 6 Months.  Allergies (verified): 1)  Hydrochlorothiazide 2)  Lisinopril 3)  * Bisphosphonates.  Review of Systems      See HPI  Physical Exam  General:  Well-developed,well-nourished,in no acute distress; alert,appropriate and cooperative  throughout examination Head:  Normocephalic and atraumatic without obvious abnormalities. No apparent alopecia or balding. Lungs:  Normal respiratory effort, chest expands symmetrically. Lungs are clear to auscultation, no crackles or wheezes. Heart:  Normal rate and regular rhythm. S1 and S2 normal without gallop, murmur, click, rub or other extra sounds. Abdomen:  Bowel sounds positive,abdomen soft and  Pulses:  +2 carotid, radial, DP Extremities:  no C/C/E Psych:  Cognition and judgment appear intact. Alert and cooperative with normal attention span and concentration. No apparent delusions, illusions, hallucinations   Impression & Recommendations:  Problem # 1:  DEPRESSION (ICD-311) Assessment Unchanged doing well since stopping the Celexa.  refill on Xanax provided. The following medications were removed from the medication list:    Citalopram Hydrobromide 20 Mg Tabs (Citalopram hydrobromide) .[name]... Take 1 tablet by mouth once a day Her updated medication list for this problem includes:    Alprazolam 0.5 Mg Tabs (Alprazolam) .[name]... 1-2 tabs by mouth daily  Problem # 2:  ESSENTIAL HYPERTENSION, BENIGN (ICD-401.1) Assessment: Unchanged BP mildly elevated today but pt reports being nervous and then drinking a lot of coffee.  encouraged her to keep track of her BP at home and notify if persistently elevated. Her updated medication list for this problem includes:    Cozaar 50 Mg Tabs (  Losartan potassium) .[name]... Take 1 tablet by mouth once a day (generic please)  Problem # 3:  PALPITATIONS, OCCASIONAL (ICD-785.1) Assessment: Unchanged offered pt EKG and subsequent cardiology w/u given her concern over this but pt replied 'i have things i want to do today.  let's hold off.  i'll bring it up again at my physical'.  reviewed red flags w/ pt that should prompt immediate return.  Pt expresses understanding and is in agreement w/ this plan.  Complete Medication List: 1)  Alprazolam 0.5 Mg Tabs  (Alprazolam) .[name].. 1-2 tabs by mouth daily 2)  Cozaar 50 Mg Tabs (Losartan potassium) .... Take 1 tablet by mouth once a day (generic please) 3)  Vitamin D [address] Unit Caps (Ergocalciferol) .... One by mouth once a week for 6 months.  Patient Instructions: 1)  Please schedule a complete physical at your convenience after Oct 12. 2)  If your chest discomfort becomes more of an issue- please call 3)  Call with any questions or concerns 4)  Have a great summer! Prescriptions: ALPRAZOLAM 0.5 MG TABS (ALPRAZOLAM) 1-2 tabs by mouth daily  #60 x 0   Entered and Authorized by:   Neena Rhymes MD   Signed by:   Neena Rhymes MD on 02/07/2010   Method used:   Print then Give to Patient   RxID:   [id number]

## 2010-09-26 NOTE — Progress Notes (Signed)
Summary: DOESN'T NEED [facility] APPT  Phone Note From Other Clinic   Caller: Lauren Call For: nurse Summary of Call: call from [facility] and said that patient was referred their but because she had a negative doppler that she didn't need to be come there for an appt. Initial call taken by: Harlene Salts,  September 27, 2008 3:44 PM

## 2010-09-26 NOTE — Progress Notes (Signed)
Summary: BP  Phone Note Call from Patient Call back at Home Phone [phone]   Caller: Patient Call For: Nani Gasser MD Summary of Call: Pt calls and says came by on Friday and left a letter from insurance company in regards to her BP medication- did you receive this letter Initial call taken by: Kathlene November,  November 08, 2008 11:09 AM  Follow-up for Phone Call        Call pt: New med sent. March 15, 20108:44 AM Metheney MD, Santina Evans   Additional Follow-up for Phone Call Additional follow up Details #1::        Pt notified.  Additional Follow-up by: Kathlene November,  November 08, 2008 11:55 AM

## 2010-09-26 NOTE — Assessment & Plan Note (Signed)
Summary: FU HTN, palpitations, etc   Vital Signs:  Patient Profile:   70 Years Old Female Height:     65.1 inches Weight:      190 pounds Pulse rate:   70 / minute BP sitting:   140 / 70  (left arm) Cuff size:   regular  Vitals Entered By: Kathlene November (March 18, 2008 8:10 AM)                 PCP:  Cipriano Bunker  Chief Complaint:  followup BP. Pt states BP has been elevated ever since switching from Benicar.  History of Present Illness: BP at home have been 122-164/61-75.No symptoms but very hapy with this numbners. Had stopped the Benicar because of fatigue and says she would prefer to go back to the Benicar. Has also been having daily episodes of feeling the need to take a sudden deep breath along with a sensation of fluttering in her chest. It is momentary and has been going on for several months. No CP, dizziness, associated with  Restarte a half a tab of celexa for her mood and feels it is helping.   Someincreased urination. But has been drinking more.   Hypertension History:      She denies headache, chest pain, palpitations, dyspnea with exertion, orthopnea, PND, peripheral edema, visual symptoms, neurologic problems, syncope, and side effects from treatment.  She notes no problems with any antihypertensive medication side effects.        Positive major cardiovascular risk factors include female age 13 years old or older and hypertension.  Negative major cardiovascular risk factors include negative family history for ischemic heart disease and non-tobacco-user status.       Current Allergies: HYDROCHLOROTHIAZIDE      Physical Exam  General:     Well-developed,well-nourished,in no acute distress; alert,appropriate and cooperative throughout examination Head:     Normocephalic and atraumatic without obvious abnormalities. No apparent alopecia or balding. Lungs:     Normal respiratory effort, chest expands symmetrically. Lungs are clear to auscultation, no crackles  or wheezes. Heart:     Normal rate and regular rhythm. S1 and S2 normal without gallop, murmur, click, rub or other extra sounds.    Impression & Recommendations:  Problem # 1:  ESSENTIAL HYPERTENSION, BENIGN (ICD-401.1) Will change back to Benicar per patient request. She would like to try it again, wonders if her mood was more for her stress and maybe not the medication. She has also gained weight but says she has really cut back and has been exercising /walking regularly. If continuing to gaine weight consider getting for adrenal corticol insufficiency with dexamethasone suppression test.  Fu BP in 2 months. Her updated medication list for this problem includes:    Benicar 20 Mg Tabs (Olmesartan medoxomil) .[name]... Take 1 tablet by mouth once a day   Problem # 2:  UNSPECIFIED EPISODIC MOOD DISORDER (ICD-296.90) Taking 1/2 Celexa tab and says she does feel it has been helping her mood so wants to continue it for now.   Problem # 3:  URINARY FREQUENCY (ICD-788.41) No dysuria or UTI sxs.  Does say she has been drinking alot more. CBG normal. Ate breakfast at 7:00 AM. No diabetes.  Orders: Capillary Blood Glucose (82948) Fingerstick (87564)   Problem # 4:  PALPITATIONS, OCCASIONAL (ICD-785.1) Rec EKG. If normal rec event monitor.  Complete Medication List: 1)  Allegra 180 Mg Tabs (Fexofenadine hcl) .... Take one tablet by mouth once a day as needed 2)  Aciphex 20 Mg Tbec (Rabeprazole sodium) .... Take one tablet by mouth once a day as needed 3)  Celexa 20 Mg Tabs (Citalopram hydrobromide) .... Take 1/2 tablet by mouth once a day 4)  Alprazolam 0.5 Mg Tb24 (Alprazolam) .... Take one tablet by mouth once a day 5)  Benicar 20 Mg Tabs (Olmesartan medoxomil) .... Take 1 tablet by mouth once a day 6)  Vitamin D 98119 Unit Caps (Ergocalciferol) .... One by mouth once a week for 6 months. 7)  Requip 0.25 Mg Tabs (Ropinirole hcl) .... Take 1 tablet by mouth once a day fin the evening for 2  days, then 2 tabs nightly for one week  Hypertension Assessment/Plan:      The patient's hypertensive risk group is category B: At least one risk factor (excluding diabetes) with no target organ damage.  Today's blood pressure is 140/70.  Her blood pressure goal is < 140/90.    Prescriptions: BENICAR 20 MG  TABS (OLMESARTAN MEDOXOMIL) Take 1 tablet by mouth once a day  #30 Tablet x 3   Entered and Authorized by:   Nani Gasser MD   Signed by:   Nani Gasser MD on 03/18/2008   Method used:   Electronically sent to ...       [id number]       [address]       [place], [place]  [address]       Ph: [phone] or [phone]       Fax: [phone]   RxID:   [id number]  ] Laboratory Results   Blood Tests   Date/Time Received: 03/18/08 @ 8:30am Date/Time Reported: 03/18/08 @ 8:30am  Glucose (fasting): 102 mg/dL   (Normal Range: 95-638)

## 2010-09-26 NOTE — Letter (Signed)
Summary: Depression Questionnaire/[name]  Depression Questionnaire/ Christina [name]   Imported By: Lanelle Bal 07/01/2009 13:17:34  _____________________________________________________________________  External Attachment:    Type:   Image     Comment:   External Document

## 2010-09-26 NOTE — Progress Notes (Signed)
Summary: INSURANCE PROBLEM WITH NEW MED  Phone Note Call from Patient Call back at Home Phone [phone]   Caller: Patient Call For: Nani Gasser MD Summary of Call: PATIENT CALLED TO SAY THAT INSURANCE WON'T COVER NEW MED SHE WAS GIVEN DURING OV YESTERDAY. CALLED PATIENT  TO GET MORE INFORMATION AND LINE BUSY.  Initial call taken by: Harlene Salts,  October 31, 2007 1:42 PM  Follow-up for Phone Call        SPOKE WITH PATIENT AND SHE SAID THAT MED WAS CHANGED TO BENICAR AND INSURANCE WON'T COVER THIS UNTIL THE 27TH OF MARCH SINCE SHE JUST FILLED HER LAST BP MED AND ALSO SHE SAID SHE STARTED TAKING ALLEGRA AND THE COUGH SEEMS TO BE BETTER AND WAS WANDERING IF THE COUGH COULD BE COMING FROM ALLERGIES INSTEAD OF ACE COUGH.  Follow-up by: Harlene Salts,  November 03, 2007 8:45 AM  Additional Follow-up for Phone Call Additional follow up Details #1::        Yes, the cough could be from allergies, but it may also be better because of the ACE.  LEt try toe Benicare for one month and if better then can try the ACEi again.  If needs more samples of BEnicar (already gave her 2 weeks wortth) to get her trhough can come by adn pick up 2 more boxes.  Additional Follow-up by: Nani Gasser MD,  November 03, 2007 8:48 AM    Additional Follow-up for Phone Call Additional follow up Details #2::    PATIENT INFORMED. Follow-up by: Harlene Salts,  November 03, 2007 9:36 AM

## 2010-09-26 NOTE — Assessment & Plan Note (Signed)
Summary: 2 WK FU COUGH   Vital Signs:  Patient Profile:   70 Years Old Female Height:     65.1 inches Weight:      183 pounds Pulse rate:   72 / minute BP sitting:   126 / 66  (left arm) Cuff size:   large  Vitals Entered By: Kathlene November (October 30, 2007 9:15 AM)                 PCP:  Cipriano Bunker  Chief Complaint:  recheck cough.Continues to have the cough- no better.  History of Present Illness: Cough is still persistant.  Did take her PPI daily with no relief.  Off her ACEi.  CXR is normal. No triggers.  Did stop her HCTZ for one week and by the end of the week did notice some improvement.  Then restarted her med and cough returned. ON HCT and NOrvasc currently.  Hoarsness occassionnally. Nonproductive.    Brough in her home BPs - range 123- 157 SBP.      Current Allergies: No known allergies       Physical Exam  General:     Well-developed,well-nourished,in no acute distress; alert,appropriate and cooperative throughout examination Lungs:     Normal respiratory effort, chest expands symmetrically. Lungs are clear to auscultation, no crackles or wheezes. Heart:     Normal rate and regular rhythm. S1 and S2 normal without gallop, murmur, click, rub or other extra sounds.    Impression & Recommendations:  Problem # 1:  ESSENTIAL HYPERTENSION, BENIGN (ICD-401.1) Stop HCT and Norvasc. Gave 2 3weeks worth of samples for Benicar 20mg .  Pt will see how much it is to fill at her pharmacy.  The following medications were removed from the medication list:    Hydrochlorothiazide 25 Mg Tabs (Hydrochlorothiazide) .[name]... Take one tablet by mouth once a day    Norvasc 10 Mg Tabs (Amlodipine besylate) .[name]... Take 1 tablet by mouth once a day  Her updated medication list for this problem includes:    Benicar 20 Mg Tabs (Olmesartan medoxomil) .[name]... Take 1 tablet by mouth once a day   Problem # 2:  COUGH (ICD-786.2) Stop HCT since this seemed to make a difference. If not  better in 1-2 weeks then will refer to ENT for further evaluation of her cough. Can stop the daily PPI.  Can try an antihistamin as needed to see if more allergy related.   Complete Medication List: 1)  Allegra 180 Mg Tabs (Fexofenadine hcl) .... Take one tablet by mouth once a day as needed 2)  Aciphex 20 Mg Tbec (Rabeprazole sodium) .... Take one tablet by mouth once a day as needed 3)  Celexa 20 Mg Tabs (Citalopram hydrobromide) .... Take 1/2 tablet by mouth once a day 4)  Alprazolam 0.5 Mg Tb24 (Alprazolam) .... Take one tablet by mouth once a day 5)  Benicar 20 Mg Tabs (Olmesartan medoxomil) .... Take 1 tablet by mouth once a day     Prescriptions: BENICAR 20 MG  TABS (OLMESARTAN MEDOXOMIL) Take 1 tablet by mouth once a day  #34 x 0   Entered and Authorized by:   Nani Gasser MD   Signed by:   Nani Gasser MD on 10/30/2007   Method used:   Elect[facility] to ...       [id number]       [address]  [place], [place]  [address]       Ph: [phone] or [phone]       Fax: [phone]   RxID:   [id number] BENICAR 20 MG  TABS (OLMESARTAN MEDOXOMIL) Take 1 tablet by mouth once a day  #30 x 0   Entered and Authorized by:   Nani Gasser MD   Signed by:   Nani Gasser MD on 10/30/2007   Method used:   Electronically sent to ...       [id number]       [address]       [place], [place]  [address]       Ph: [phone] or [phone]       Fax: [phone]   RxID:   [id number]  ]

## 2010-09-26 NOTE — Progress Notes (Signed)
Summary: Cozaar Rx  Phone Note Refill Request Message from:  Patient on December 09, 2008 11:27 AM  Refills Requested: Medication #1:  COZAAR 50 MG TABS Take 1 tablet by mouth once a day (generic please) Initial call taken by: Payton Spark CMA,  December 09, 2008 11:28 AM      Prescriptions: COZAAR 50 MG TABS (LOSARTAN POTASSIUM) Take 1 tablet by mouth once a day (generic please)  #30 x 5   Entered by:   Payton Spark CMA   Authorized by:   Nani Gasser MD   Signed by:   Payton Spark CMA on 12/09/2008   Method used:   [facility]        [phone]       [address] [place], [place]  [address]       Ph: [phone] or [phone]       Fax: [phone]   RxID:   [id number]

## 2010-09-26 NOTE — Assessment & Plan Note (Signed)
Summary: Erythema on leg   Vital Signs:  Patient Profile:   70 Years Old Female Height:     65.1 inches Weight:      186 pounds Pulse rate:   82 / minute BP sitting:   122 / 62  (left arm) Cuff size:   regular  Vitals Entered By: Kathlene November (September 10, 2008 10:28 AM)                 PCP:  Cipriano Bunker  Chief Complaint:  hospital followup form cellulitis. Pt states felt better but at times feels like its coming back.  History of Present Illness: hospital followup form cellulitis seen in ED for 08-29-2008. Pt states felt better but at times feels like its coming back.  Was put on an ABX, hydrocodine, and Ibuprofen.  Says the ABX did seem to help. Still having occ sharp pain in that area. .  Right now she is pain free. Thought hurt her all day yesterday. No hx of DVT.  No swelling in the LE.      Current Allergies: HYDROCHLOROTHIAZIDE      Physical Exam  General:     Well-developed,well-nourished,in no acute distress; alert,appropriate and cooperative throughout examination Msk:     Right leg with some slight erythema lateral of her knee that is approx 8-10 cm. In that area she also has diffuse spider veins.     Impression & Recommendations:  Problem # 1:  LEG PAIN (ICD-729.5) Will set up for vascular US of the right leg to rule out DVT, thought  unlikely based on exam. It does not appear to be cellulitis either.  Likely superficial phlebitis. Ice, elevation, and take her ASA daily. Can use her ibuprofen as needed as well.  Orders: T-*Unlisted Diagnostic X-ray test/procedure (45409)   Complete Medication List: 1)  Allegra 180 Mg Tabs (Fexofenadine hcl) .... Take one tablet by mouth once a day as needed 2)  Aciphex 20 Mg Tbec (Rabeprazole sodium) .... Take one tablet by mouth once a day as needed 3)  Celexa 20 Mg Tabs (Citalopram hydrobromide) .... Take 1/2 tablet by mouth once a day 4)  Alprazolam 0.5 Mg Tabs (Alprazolam) .[name].. 1-2 tabs by mouth daily 5)  Benicar  20 Mg Tabs (Olmesartan medoxomil) .... Take 1 tablet by mouth once a day 6)  Vitamin D 81191 Unit Caps (Ergocalciferol) .... One by mouth once a week for 6 months. 7)  Requip 0.25 Mg Tabs (Ropinirole hcl) .... Take 1 tablet by mouth once a day fin the evening for 2 days, then 2 tabs nightly for one week

## 2010-09-26 NOTE — Letter (Signed)
Summary: Depression Questionnaire/[place] Kathryne Sharper  Depression Questionnaire/[place] Kathryne Sharper   Imported By: Lanelle Bal 06/16/2009 09:24:00  _____________________________________________________________________  External Attachment:    Type:   Image     Comment:   External Document

## 2010-09-26 NOTE — Progress Notes (Signed)
Summary: LE venous doppler normal.  Phone Note From Other Clinic   Caller: [facility] Call For: Bowen Summary of Call: Pt had right lower extermity doppler and it was negative for any clot or bakers cyst Initial call taken by: Kathlene November,  September 13, 2008 10:21 AM  Follow-up for Phone Call        Pls let pt know that her LE venous doppler study was normal.  Neg for clot. Follow-up by: Seymour Bars DO,  September 13, 2008 11:56 AM      Appended Document: LE venous doppler normal.  Pt notified of normal doppler. KJ LPN

## 2010-09-26 NOTE — Assessment & Plan Note (Signed)
Summary: 3 WK FU HTN, Depression   Vital Signs:  Patient profile:   70 year old female Height:      65.1 inches Weight:      181 pounds Pulse rate:   79 / minute BP sitting:   129 / 66  (left arm) Cuff size:   regular  Vitals Entered By: Kathlene November (June 28, 2009 8:37 AM) CC: follow-up BP, Hypertension Management   Primary Care Provider:  Linford Arnold, C  CC:  follow-up BP and Hypertension Management.  History of Present Illness: Depression feels the citalopram is really working. Feels coping with her emotions much better.  NO side effects. Beginning to feel  like her old self.  Still has occ days were really thinks alot of the stressors.    Hypertension History:      She denies headache, chest pain, palpitations, dyspnea with exertion, orthopnea, PND, peripheral edema, visual symptoms, neurologic problems, syncope, and side effects from treatment.  She notes no problems with any antihypertensive medication side effects.  Doing well on the cozaar. [name]        Positive major cardiovascular risk factors include female age 59 years old or older and hypertension.  Negative major cardiovascular risk factors include negative family history for ischemic heart disease and non-tobacco-user status.     Current Medications (verified): 1)  Alprazolam 0.5 Mg Tabs (Alprazolam) .[name].. 1-2 Tabs By Mouth Daily 2)  Cozaar 50 Mg Tabs (Losartan Potassium) .... Take 1 Tablet By Mouth Once A Day (Generic Please) 3)  Vitamin D 16109 Unit  Caps (Ergocalciferol) .... One By Mouth Once A Week For 6 Months. 4)  Citalopram Hydrobromide 20 Mg Tabs (Citalopram Hydrobromide) .... 1/2 Tab By Mouth Once A Day For 1 Week Then Increase To Whole Tab A Day.  Allergies (verified): 1)  Hydrochlorothiazide 2)  Lisinopril  Comments:  Nurse/Medical Assistant: The patient's medications and allergies were reviewed with the patient and were updated in the Medication and Allergy Lists. Kathlene November (June 28, 2009 8:38  AM)  Physical Exam  General:  Well-developed,well-nourished,in no acute distress; alert,appropriate and cooperative throughout examination Psych:  Cognition and judgment appear intact. Alert and cooperative with normal attention span and concentration. No apparent delusions, illusions, hallucinations   Impression & Recommendations:  Problem # 1:  ESSENTIAL HYPERTENSION, BENIGN (ICD-401.1) Looks great on the cozaar.  Fu in 6 months.  Her updated medication list for this problem includes:    Cozaar 50 Mg Tabs (Losartan potassium) .[name]... Take 1 tablet by mouth once a day (generic please)  BP today: 129/66 Prior BP: 152/75 (06/07/2009)  Prior 10 Yr Risk Heart Disease: Not enough information (11/13/2007)  Labs Reviewed: K+: 4.3 (06/07/2009) Creat: : 0.80 (06/07/2009)   Chol: 173 (05/26/2008)   HDL: 62 (05/26/2008)   LDL: 94 (05/26/2008)   TG: 87 (05/26/2008)  Problem # 2:  DEPRESSION (ICD-311) Doing much better. Her PHQ-9 today is  6  , down from 15. Will increase her citalorpam to 3omg and fu in 6 weeks. Call if anhy problems or SE. SHe is doing much better. Encouraged her to stay on the medication through the Holidays.  Her updated medication list for this problem includes:    Alprazolam 0.5 Mg Tabs (Alprazolam) .[name]... 1-2 tabs by mouth daily    Citalopram Hydrobromide 20 Mg Tabs (Citalopram hydrobromide) .[name]... Take 1 tablet by mouth once a day  Complete Medication List: 1)  Alprazolam 0.5 Mg Tabs (Alprazolam) .[name].. 1-2 tabs by mouth daily  2)  Cozaar 50 Mg Tabs (Losartan potassium) .... Take 1 tablet by mouth once a day (generic please) 3)  Vitamin D 16109 Unit Caps (Ergocalciferol) .... One by mouth once a week for 6 months. 4)  Citalopram Hydrobromide 20 Mg Tabs (Citalopram hydrobromide) .... Take 1 tablet by mouth once a day  Hypertension Assessment/Plan:      The patient's hypertensive risk group is category B: At least one risk factor (excluding diabetes) with no target organ  damage.  Her calculated 10 year risk of coronary heart disease is 5 %.  Today's blood pressure is 129/66.  Her blood pressure goal is < 140/90. Prescriptions: ALPRAZOLAM 0.5 MG TABS (ALPRAZOLAM) 1-2 tabs by mouth daily  #60 x 0   Entered and Authorized by:   Nani Gasser MD   Signed by:   Nani Gasser MD on 06/28/2009   Method used:   Printed then faxed to ...       [facility] [phone]* (retail)       [address] [place], [place]  [address]       Ph: [phone]       Fax: [phone]   RxID:   [id number] CITALOPRAM HYDROBROMIDE 20 MG TABS (CITALOPRAM HYDROBROMIDE) Take 1 tablet by mouth once a day  #90 x 1   Entered and Authorized by:   Nani Gasser MD   Signed by:   Nani Gasser MD on 06/28/2009   Method used:   Electronically to        [facility] [phone]* (retail)       [address]       [place], [place]  [address]       Ph: [phone]       Fax: [phone]   RxID:   [id number]

## 2010-09-26 NOTE — Progress Notes (Signed)
Summary: refill   Phone Note Refill Request Message from:  Fax from Pharmacy on March 21, 2010 9:12 AM  Refills Requested: Medication #1:  ALPRAZOLAM 0.5 MG TABS 1-2 tabs by mouth daily Gerome Apley - fax [phone] - tel [phone]  Initial call taken by: Okey Regal [place],  March 21, 2010 9:14 AM    Prescriptions: ALPRAZOLAM 0.5 MG TABS (ALPRAZOLAM) 1-2 tabs by mouth daily  #60 x 0   Entered and Authorized by:   Nani Gasser MD   Signed by:   Nani Gasser MD on 03/21/2010   Method used:   Printed then faxed to ...       [phone]       [address]       [place], [place]  [address]       Ph: [phone] or [phone]       Fax: [phone]   RxID:   [id number]

## 2010-09-26 NOTE — Assessment & Plan Note (Signed)
Summary: CPE, depression   Vital Signs:  Patient profile:   70 year old female Height:      65.1 inches Weight:      185 pounds BMI:     30.80 Pulse rate:   77 / minute BP sitting:   152 / 75  (left arm) Cuff size:   regular  Vitals Entered By: Kathlene November (June 07, 2009 8:16 AM) CC: CPE no pap Flu Vaccine Consent Questions     Do you have a history of severe allergic reactions to this vaccine? no    Any prior history of allergic reactions to egg and/or gelatin? no    Do you have a sensitivity to the preservative Thimersol? no    Do you have a past history of Guillan-Barre Syndrome? no    Do you currently have an acute febrile illness? no    Have you ever had a severe reaction to latex? no    Vaccine information given and explained to patient? yes    Are you currently pregnant? no    Lot Number:[id number]   Exp Date:02/23/2010   Site Given  Left Deltoid IM    Primary Care Provider:  Linford Arnold, C  CC:  CPE no pap.  History of Present Illness: Having a lot of family stressors. Not sleeping well. Feels like her mind won't stope. These thouhts are comsuming hersel. No thoughts of hurting herself. Feeling very down.  Says has thougth about the cousneling but wants to hold off. INterested in medication. Has been struggling with this for 4 months.   Current Medications (verified): 1)  Alprazolam 0.5 Mg Tabs (Alprazolam) .[name].. 1-2 Tabs By Mouth Daily 2)  Cozaar 50 Mg Tabs (Losartan Potassium) .... Take 1 Tablet By Mouth Once A Day (Generic Please) 3)  Vitamin D 06269 Unit  Caps (Ergocalciferol) .... One By Mouth Once A Week For 6 Months.  Allergies (verified): 1)  Hydrochlorothiazide 2)  Lisinopril  Comments:  Nurse/Medical Assistant: The patient's medications and allergies were reviewed with the patient and were updated in the Medication and Allergy Lists. Kathlene November (June 07, 2009 8:17 AM)  Past History:  Past Medical History: Last updated:  05/26/2008 Vertigo  Past Surgical History: Last updated: 10/13/2007 Breast bx in 1960s adn 70s right and left Tubal ligation 1970s Cyst removed from abdomen 1980s  Family History: Last updated: 05/26/2008 Father with alcoholism Mother wiht lung Ca Sister with uterine Ca, DM, HTN GM with stroke.   Social History: Last updated: 05/26/2008 REtired from working at [employer].  Widowed.   Former Smoker Alcohol use-no Drug use-no Regular exercise-yes  Review of Systems       The patient complains of depression.  The patient denies anorexia, fever, weight loss, weight gain, vision loss, decreased hearing, hoarseness, chest pain, syncope, dyspnea on exertion, peripheral edema, prolonged cough, headaches, hemoptysis, abdominal pain, melena, hematochezia, severe indigestion/heartburn, hematuria, incontinence, genital sores, muscle weakness, suspicious skin lesions, transient blindness, difficulty walking, unusual weight change, abnormal bleeding, enlarged lymph nodes, angioedema, and breast masses.    Physical Exam  General:  Well-developed,well-nourished,in no acute distress; alert,appropriate and cooperative throughout examination Head:  Normocephalic and atraumatic without obvious abnormalities. No apparent alopecia or balding. Eyes:  No corneal or conjunctival inflammation noted. EOMI. Perrla.  Ears:  External ear exam shows no significant lesions or deformities.  Left TM is occluded by was. Right TM is clear. Hearing is grossly normal.  Nose:  External nasal examination shows no deformity or inflammation. Nasal mucosa  are pink and moist without lesions or exudates. Mouth:  Oral mucosa and oropharynx without lesions or exudates.  Teeth in good repair. Neck:  No deformities, masses, or tenderness noted. Chest Wall:  No deformities, masses, or tenderness noted. Breasts:  Right bresat normal with no lesion. LEft breast did have a 0.5 cm flat irreg lesion at teh 11 o clock position  about 2 cm fromt eh edge of the areola that feels more like scar tissue.  Lungs:  Normal respiratory effort, chest expands symmetrically. Lungs are clear to auscultation, no crackles or wheezes. Heart:  Normal rate and regular rhythm. S1 and S2 normal without gallop, murmur, click, rub or other extra sounds. Abdomen:  Bowel sounds positive,abdomen soft and non-tender without masses, organomegaly or hernias noted. Msk:  No deformity or scoliosis noted of thoracic or lumbar spine.   Pulses:  R and L carotid,radial,dorsalis pedis and posterior tibial pulses are full and equal bilaterally Extremities:  No clubbing, cyanosis, edema, or deformity noted with normal full range of motion of all joints.   Neurologic:  No cranial nerve deficits noted. Station and gait are normal. DTRs are symmetrical throughout. Sensory, motor and coordinative functions appear intact. Skin:  no rashes.   Cervical Nodes:  No lymphadenopathy noted Axillary Nodes:  No palpable lymphadenopathy Psych:  Cognition and judgment appear intact. Alert and cooperative with normal attention span and concentration. No apparent delusions, illusions, hallucinations   Impression & Recommendations:  Problem # 1:  PREVENTIVE HEALTH CARE (ICD-V70.0) Exam is fairly normal.  Left ear flushed because occluded by wax Flu and pneumococcal  shots given. Decline the shingles vaccine. Says she will check on the tdap.   Encouage daily exercise and low fat diet. REcheck BP in 3 weeks. We had changed her from Benicar to  Coozar and BP is elevated but she is tearful and feels very stressed today.  Due for her DEXA since post menopausal.   Orders: T-Comprehensive Metabolic Panel ([id number]) T-TSH ([id number])  Problem # 2:  DEPRESSION (ICD-311) Assessment: Unchanged Sxs x 4months.  PHQ -9 score was 15 ( moderately severe). Discussed starting an SSRI. Wrned of potential SE. Call if any problems. No suicidal thoughts. STrongly encouraged  counseling but she wants to hold off for now.  Fu in 3 weeks.  The following medications were removed from the medication list:    Celexa 20 Mg Tabs (Citalopram hydrobromide) .[name]... Take 1/2 tablet by mouth once a day Her updated medication list for this problem includes:    Alprazolam 0.5 Mg Tabs (Alprazolam) .[name]... 1-2 tabs by mouth daily    Citalopram Hydrobromide 20 Mg Tabs (Citalopram hydrobromide) .[name]... 1/2 tab by mouth once a day for 1 week then increase to whole tab a day.  Complete Medication List: 1)  Alprazolam 0.5 Mg Tabs (Alprazolam) .[name].. 1-2 tabs by mouth daily 2)  Cozaar 50 Mg Tabs (Losartan potassium) .... Take 1 tablet by mouth once a day (generic please) 3)  Vitamin D 29562 Unit Caps (Ergocalciferol) .... One by mouth once a week for 6 months. 4)  Citalopram Hydrobromide 20 Mg Tabs (Citalopram hydrobromide) .... 1/2 tab by mouth once a day for 1 week then increase to whole tab a day.  Other Orders: T-Dual DXA Bone Density/ Axial (13086) Flu Vaccine 65yrs + (57846) Administration Flu vaccine - MCR (N6295) Pneumococcal Vaccine (28413) Admin 1st Vaccine (24401) Admin of Any Addtl Vaccine (02725)  Contraindications/Deferment of Procedures/Staging:    Test/Procedure: Zoster vaccine    Reason for  deferment: declined  Prescriptions: CITALOPRAM HYDROBROMIDE 20 MG TABS (CITALOPRAM HYDROBROMIDE) 1/2 tab by mouth once a day for 1 week then increase to whole tab a day.  #30 x 0   Entered and Authorized by:   Nani Gasser MD   Signed by:   Nani Gasser MD on 06/07/2009   Method used:   [facility]ronically to        [facility]  [address] [phone][facility] (retail)       [address] [place], [place]  [address]       Ph: [phone] or [phone]       Fax: [phone]   RxID:   [id number]   Flex Sig Next Due:  Not Indicated Hemoccult Next Due:  Not Indicated    Immunizations Administered:  Pneumonia Vaccine:    Vaccine Type: Pneumovax    Site: right  deltoid    Mfr: Merck    Dose: 0.5 ml    Route: IM    Given by: Kathlene November    Exp. Date: 07/20/2010    Lot #: [id number]    VIS given: 03/24/96 version given June 07, 2009.

## 2010-09-26 NOTE — Progress Notes (Signed)
Summary: Vein specialist  Phone Note Call from Patient Call back at Home Phone [phone]   Caller: Patient Call For: Nani Gasser MD Summary of Call: Pt still complains of legs hurting and wonders if needs to see a vein specialists and if she can justr call or if needs a referral.  Initial call taken by: Kathlene November,  September 22, 2008 10:05 AM  Follow-up for Phone Call        Should be able to call on her own.  But if per her insurance needs a referral we will be  happy to make one.  Follow-up by: Nani Gasser MD,  September 22, 2008 10:09 AM  Additional Follow-up for Phone Call Additional follow up Details #1::        Pt notified of MD instructions. KJ LPN Additional Follow-up by: Kathlene November,  September 22, 2008 10:16 AM

## 2010-09-26 NOTE — Progress Notes (Signed)
Summary: Pt requests Benicar 40mg  1/2 tab daily  Phone Note Call from Patient   Caller: Patient Summary of Call: Pt states she went to the pharm to pick up Rx and the Rx was Cozaar. Pt states at her last OV she discussed with you about keeping the Benicar until April. Ins comp will pay for it until end of March. Pt states she requests Benicar 40mg  1/2 tab daily bc she is going on vacation on friday and again in april and did not want to start a new med while out of town. Please send Benicar 40mg  1/2 tab [facility]. Pt to pick up 1 week of Benicar 20mg  samples bc she is completely out of meds.  Initial call taken by: Payton Spark CMA,  November 10, 2008 3:30 PM  Follow-up for Phone Call        OV was over a month ago so when I got the letter from [employer] I changed it. Sorry for the confustion.   Additional Follow-up for Phone Call Additional follow up Details #1::        Pt notified med sent to pharmacy Additional Follow-up by: Kathlene November,  November 11, 2008 8:33 AM    New/Updated Medications: BENICAR 20 MG TABS (OLMESARTAN MEDOXOMIL) 1 by mouth daily BENICAR 40 MG TABS (OLMESARTAN MEDOXOMIL) 1/2 tab by mouth daily   Prescriptions: BENICAR 40 MG TABS (OLMESARTAN MEDOXOMIL) 1/2 tab by mouth daily  #30 x 1   Entered and Authorized by:   Nani Gasser MD   Signed by:   Nani Gasser MD on 11/11/2008   Method used:   [facility]        [facility]  [address] [phone][facility] (retail)       [address] [place], [place]  [address]       Ph: [phone] or [phone]       Fax: [phone]   RxID:   [id number] BENICAR 20 MG TABS (OLMESARTAN MEDOXOMIL) 1 by mouth daily  #7 x 0   Entered by:   Payton Spark CMA   Authorized by:   Nani Gasser MD   Signed by:   Payton Spark CMA on 11/10/2008   Method used:   Samples Given   RxID:   [id number] BENICAR 20 MG TABS (OLMESARTAN MEDOXOMIL) 1 by mouth daily  #0 x 0   Entered by:    Payton Spark CMA   Authorized by:   Nani Gasser MD   Signed by:   Payton Spark CMA on 11/10/2008   Method used:   Samples Given   RxID:   [id number]

## 2010-09-26 NOTE — Assessment & Plan Note (Signed)
Summary: FU BP, Leg Pain   Vital Signs:  Patient Profile:   70 Years Old Female Height:     65.1 inches Weight:      186 pounds Pulse rate:   78 / minute BP sitting:   139 / 69  (left arm) Cuff size:   regular  Vitals Entered By: Kathlene November (February 24, 2008 9:46 AM)                  PCP:  Cipriano Bunker  Chief Complaint:  followup BP.  History of Present Illness: Felt very drained and tired. Noticed it would be low in the evenings.  Started cutting it in half for about 2 weeks. This last week has been getting 117/62.  Does feel better on half a tab. Says he pressure have been better as well. Denies having any highs.    Still having pain in her legs when trying to go to sleep. Pain over entire legs and forearms. Says feels better if she can move them. No pain with acitivity that is relieved by rest.  Says it really disturbs her sleep. Unsure if it happens if tries to take a nap.  No cramping or weakness or numbness in legs.  Occurs every night and has been happening for years but worse over last year.  No real alleviating sxs or aggrevating sxs. Never had work up before. No hxof CAD or claudication, though she is a former smoker.     Current Allergies: HYDROCHLOROTHIAZIDE   Social History:    Reviewed history from 10/13/2007 and no changes required:       REtired.  Widowed.         Former Smoker       Alcohol use-no       Drug use-no       Regular exercise-yes     Physical Exam  General:     Well-developed,well-nourished,in no acute distress; alert,appropriate and cooperative throughout examination Head:     Normocephalic and atraumatic without obvious abnormalities. No apparent alopecia or balding. Lungs:     Normal respiratory effort, chest expands symmetrically. Lungs are clear to auscultation, no crackles or wheezes. Heart:     Normal rate and regular rhythm. S1 and S2 normal without gallop, murmur, click, rub or other extra sounds. Psych:     Cognition and  judgment appear intact. Alert and cooperative with normal attention span and concentration. No apparent delusions, illusions, hallucinations    Impression & Recommendations:  Problem # 1:  ESSENTIAL HYPERTENSION, BENIGN (ICD-401.1) Because of fatigue will try switching her to Micardis 20mg . Pt to monitor  her BP at home and see if her fatigue improves.  FU in 1 month.  Her updated medication list for this problem includes:    Benicar 20 Mg Tabs (Olmesartan medoxomil) .[name]... Take 1 tablet by mouth once a day   Problem # 2:  LEG PAIN (ICD-729.5) Discussed possibility of RLS. Will rule out other causes. if neg  can consider starting Requip at bedtime.  Orders: T-TSH [phone]) T-CBC No Diff (57322-02542) T-Vitamin B12 [phone]) T-Vitamin D (25-Hydroxy) (15176-16073) T-Folic Acid, Serum (71062-69485)   Complete Medication List: 1)  Allegra 180 Mg Tabs (Fexofenadine hcl) .... Take one tablet by mouth once a day as needed 2)  Aciphex 20 Mg Tbec (Rabeprazole sodium) .... Take one tablet by mouth once a day as needed 3)  Celexa 20 Mg Tabs (Citalopram hydrobromide) .... Take 1/2 tablet by mouth once a day 4)  Alprazolam 0.5 Mg Tb24 (Alprazolam) .... Take one tablet by mouth once a day 5)  Benicar 20 Mg Tabs (Olmesartan medoxomil) .... Take 1 tablet by mouth once a day    ]

## 2010-09-26 NOTE — Letter (Signed)
Summary: Surgical Clearance/[facility]  Surgical Clearance/[facility]   Imported By: Lanelle Bal 12/18/2008 08:44:06  _____________________________________________________________________  External Attachment:    Type:   Image     Comment:   External Document

## 2010-09-26 NOTE — Assessment & Plan Note (Signed)
Summary: CPE-URI, etc   Vital Signs:  Patient Profile:   70 Years Old Female Height:     65.1 inches Weight:      179 pounds Pulse rate:   81 / minute BP sitting:   127 / 73  (left arm) Cuff size:   regular  Vitals Entered By: Kathlene November (May 26, 2008 8:36 AM)                 PCP:  Cipriano Bunker  Chief Complaint:  CPE with pap?[name]  History of Present Illness: Started with Sx with ST and mild subjective fever for about 4 days.  Dry cough. NO ear pain.  No nausea.  Usually gets this similar sxs this time of year. No GI sxs.  Bought an OTC med for cold but doesn't know the name of it. No eye or nose itching. No watery eyes.   Occ gets SOB senstation where involuntarily gasps. Can happen any time. Happens almost daily. Multiple times a  week. Had a normal stress test about 5 years ago.  No CP or palpitations with it. Does have a hx of reflux but doens't take her aciphex.    Has never had an abnormal pap.  Did have some spotting couple of months ago. Post menopausal for years. She is widowed and no new sexual partners.  Last pap was one year ago and normal.      Current Allergies: HYDROCHLOROTHIAZIDE  Past Medical History:    Vertigo  Past Surgical History:    Reviewed history from 10/13/2007 and no changes required:       Breast bx in 1960s adn 70s right and left       Tubal ligation 1970s       Cyst removed from abdomen 1980s   Family History:    Father with alcoholism    Mother wiht lung Ca    Sister with uterine Ca, DM, HTN    GM with stroke.   Social History:    REtired from working at [employer].  Widowed.      Former Smoker    Alcohol use-no    Drug use-no    Regular exercise-yes   Risk Factors:  PAP Smear History:     Date of Last PAP Smear:  03/28/2007    Results:  Unknown    Review of Systems  The patient denies anorexia, fever, weight loss, weight gain, vision loss, decreased hearing, hoarseness, chest pain, syncope, dyspnea on  exertion, peripheral edema, prolonged cough, headaches, hemoptysis, abdominal pain, melena, hematochezia, severe indigestion/heartburn, hematuria, incontinence, genital sores, muscle weakness, suspicious skin lesions, transient blindness, difficulty walking, depression, unusual weight change, abnormal bleeding, enlarged lymph nodes, and breast masses.         Does occ feels SOB.  Can happen anytime at rest or activity.     Physical Exam  General:     Well-developed,well-nourished,in no acute distress; alert,appropriate and cooperative throughout examination Head:     Normocephalic and atraumatic without obvious abnormalities. No apparent alopecia or balding. Eyes:     No corneal or conjunctival inflammation noted. EOMI. Perrla.  Ears:     External ear exam shows no significant lesions or deformities.  Otoscopic examination reveals clear canals, tympanic membranes are intact bilaterally without bulging, retraction, inflammation or discharge. Hearing is grossly normal bilaterally. Nose:     External nasal examination shows no deformity or inflammation.  Mouth:     Oral mucosa and oropharynx without lesions or exudates.  Top Dentures.  Neck:     No deformities, masses, or tenderness noted. Chest Wall:     No deformities, masses, or tenderness noted. Breasts:     No mass, nodules, thickening, tenderness, bulging, retraction, inflamation, nipple discharge or skin changes noted.   Lungs:     Normal respiratory effort, chest expands symmetrically. Lungs are clear to auscultation, no crackles or wheezes. Heart:     Normal rate and regular rhythm. S1 and S2 normal without gallop, murmur, click, rub or other extra sounds.  No caortid bruits.  Abdomen:     Bowel sounds positive,abdomen soft and non-tender without masses, organomegaly or hernias noted. Rectal:     No external abnormalities noted. Genitalia:     Normal introitus for age, no external lesions, no vaginal discharge, mucosa pink and  moist, no vaginal or cervical lesions, no vaginal atrophy, no friaility or hemorrhage, normal uterus size and position, no adnexal masses or tenderness Msk:     No deformity or scoliosis noted of thoracic or lumbar spine.   Pulses:     R and L carotid,radial,dorsalis pedis and posterior tibial pulses are full and equal bilaterally Extremities:     No clubbing, cyanosis, edema, or deformity noted with normal full range of motion of all joints.   Neurologic:     No cranial nerve deficits noted. Station and gait are normal.  Sensory, motor and coordinative functions appear intact. Skin:     no rashes.   Cervical Nodes:     No lymphadenopathy noted Axillary Nodes:     No palpable lymphadenopathy Psych:     Cognition and judgment appear intact. Alert and cooperative with normal attention span and concentration. No apparent delusions, illusions, hallucinations    Impression & Recommendations:  Problem # 1:  ROUTINE GYNECOLOGICAL EXAMINATION (ICD-V72.31) Exam is normal.  REcommend pap since had spotting.  If normal repeat in 3 years.  Flu shot given.  Make sure taking calcium regularly.  Labs.   Problem # 2:  SOB (ICD-786.05) GAve samples of PPI to take daily for 3 weeks. Keep a calendar chart of SOB episodes and see if improved. Suspect they are reflux related. If not improving then can workup further with possible CXR and or ENT referral.  Sxs description not consistant with COPD, etc.   Problem # 3:  COUGH (ICD-786.2) Suspect viral URI.  Call if not better in one week. Lungs are CTA today.  Symptomatic  care.  Consider may be allergy related as she gets similar sxs same time of year every year.   Complete Medication List: 1)  Allegra 180 Mg Tabs (Fexofenadine hcl) .... Take one tablet by mouth once a day as needed 2)  Aciphex 20 Mg Tbec (Rabeprazole sodium) .... Take one tablet by mouth once a day as needed 3)  Celexa 20 Mg Tabs (Citalopram hydrobromide) .... Take 1/2 tablet by mouth  once a day 4)  Alprazolam 0.5 Mg Tabs (Alprazolam) .[name].. 1-2 tabs by mouth daily 5)  Benicar 20 Mg Tabs (Olmesartan medoxomil) .... Take 1 tablet by mouth once a day 6)  Vitamin D 16109 Unit Caps (Ergocalciferol) .... One by mouth once a week for 6 months. 7)  Requip 0.25 Mg Tabs (Ropinirole hcl) .... Take 1 tablet by mouth once a day fin the evening for 2 days, then 2 tabs nightly for one week  Other Orders: T-Comprehensive Metabolic Panel ([id number]) T-Lipid Profile ([id number]) T-TSH (13086-57846)    Prescriptions: ALPRAZOLAM 0.5 MG TABS (ALPRAZOLAM) 1-2  tabs by mouth daily  #60 x 0   Entered and Authorized by:   Nani Gasser MD   Signed by:   Nani Gasser MD on 05/26/2008   Method used:   Print then Give to Patient   RxID:   [id number]  ]

## 2010-09-26 NOTE — Progress Notes (Signed)
Summary: Needs referral order  Phone Note Call from Patient Call back at Home Phone [phone]   Caller: Patient Call For: Nani Gasser MD Summary of Call: Pt called vein and vascular office Dr. Sammie Bench and they do need an official referral order from [place] Number is [phone] and stated they said to put on the referral referred by friend Initial call taken by: Kathlene November,  September 24, 2008 11:00 AM

## 2010-09-26 NOTE — Assessment & Plan Note (Signed)
Summary: FU Cough, HTN   Vital Signs:  Patient Profile:   70 Years Old Female Height:     65.1 inches Weight:      183 pounds Pulse rate:   72 / minute BP sitting:   120 / 64  (left arm) Cuff size:   regular  Vitals Entered By: Kathlene November (November 13, 2007 10:13 AM)                 PCP:  Cipriano Bunker  Chief Complaint:  recheck BP and cough. Pt states the cough is still there but is better- she questions that it might be allergies..  History of Present Illness: Cough is better but still present. Doesn't continually coughnd no phlegm.  Not bothersome at night.  PPI didn't help initially but says did have a day where had heartburn and coughing abnd took her Aciphex and it helped greatly.  Itchy nose and watery eyes.  No voice hoarseness. No shortness of breath. Says she does have a dx of mild asthma.  Has an inhaler but has never used it.    Hypertension History:      She denies headache, chest pain, palpitations, dyspnea with exertion, orthopnea, PND, peripheral edema, visual symptoms, neurologic problems, syncope, and side effects from treatment.  She notes problems with antihypertensive medication side effects.  Further comments include: BP in 140-150 until yesterday, SBP was 114 at home. [name]        Positive major cardiovascular risk factors include female age 22 years old or older and hypertension.  Negative major cardiovascular risk factors include negative family history for ischemic heart disease and non-tobacco-user status.       Current Allergies: No known allergies       Physical Exam  General:     Well-developed,well-nourished,in no acute distress; alert,appropriate and cooperative throughout examination Head:     Normocephalic and atraumatic without obvious abnormalities. No apparent alopecia or balding. Eyes:     No corneal or conjunctival inflammation noted. EOMI. Perrla. Ears:     External ear exam shows no significant lesions or deformities.  Otoscopic  examination reveals clear canals, tympanic membranes are intact bilaterally without bulging, retraction, inflammation or discharge. Hearing is grossly normal bilaterally. Nose:     External nasal examination shows no deformity or inflammation. Nasal mucosa are pink and moist without lesions or exudates. Mouth:     Oral mucosa and oropharynx without lesions or exudates.  Teeth in good repair. Lungs:     Normal respiratory effort, chest expands symmetrically. Lungs are clear to auscultation, no crackles or wheezes. Heart:     Normal rate and regular rhythm. S1 and S2 normal without gallop, murmur, click, rub or other extra sounds.    Impression & Recommendations:  Problem # 1:  COUGH (ICD-786.2) Better.  Offered ENT referral. Pt prefers to hold off for now. Use PPI as needed or daily. Can also try using her inhlaer once a day for one week and see if cough is better. Will avoid ACE.  Consider retrial of HCT if needed for BP manamgement. CXR was normal.   Problem # 2:  ESSENTIAL HYPERTENSION, BENIGN (ICD-401.1) Looks great on [employer]. Continue to monitor at home.  Her updated medication list for this problem includes:    Benicar 20 Mg Tabs (Olmesartan medoxomil) .[name]... Take 1 tablet by mouth once a day   Complete Medication List: 1)  Allegra 180 Mg Tabs (Fexofenadine hcl) .... Take one tablet by mouth once a  day as needed 2)  Aciphex 20 Mg Tbec (Rabeprazole sodium) .... Take one tablet by mouth once a day as needed 3)  Celexa 20 Mg Tabs (Citalopram hydrobromide) .... Take 1/2 tablet by mouth once a day 4)  Alprazolam 0.5 Mg Tb24 (Alprazolam) .... Take one tablet by mouth once a day 5)  Benicar 20 Mg Tabs (Olmesartan medoxomil) .... Take 1 tablet by mouth once a day  Hypertension Assessment/Plan:      The patient's hypertensive risk group is category B: At least one risk factor (excluding diabetes) with no target organ damage.  Today's blood pressure is 120/64.     Patient  Instructions: 1)  Please schedule a follow-up appointment in 2 months.    ]

## 2010-10-04 ENCOUNTER — Encounter: Payer: Self-pay | Admitting: Family Medicine

## 2010-10-22 IMAGING — OT DG DXA BONE DENSITY STUDY HL7
1 series · 1 of 1 positions shown · non-contrast
Comparison: There has been no significant change in bone mineral
density in the spine or total left hip as compared to prior study
of 12/26/2006.

CLINICAL DATA: 68-year-old postmenopausal female who takes vitamin
D.  She has taken Fosamax, Actonel and Boniva in the past,
discontinuing these medications over 5 years ago.  She took
hormones for 8 years in the past.  She has hypertension.

[Series 1: — · left · 1 of 1 slices shown]
[im 1/1]
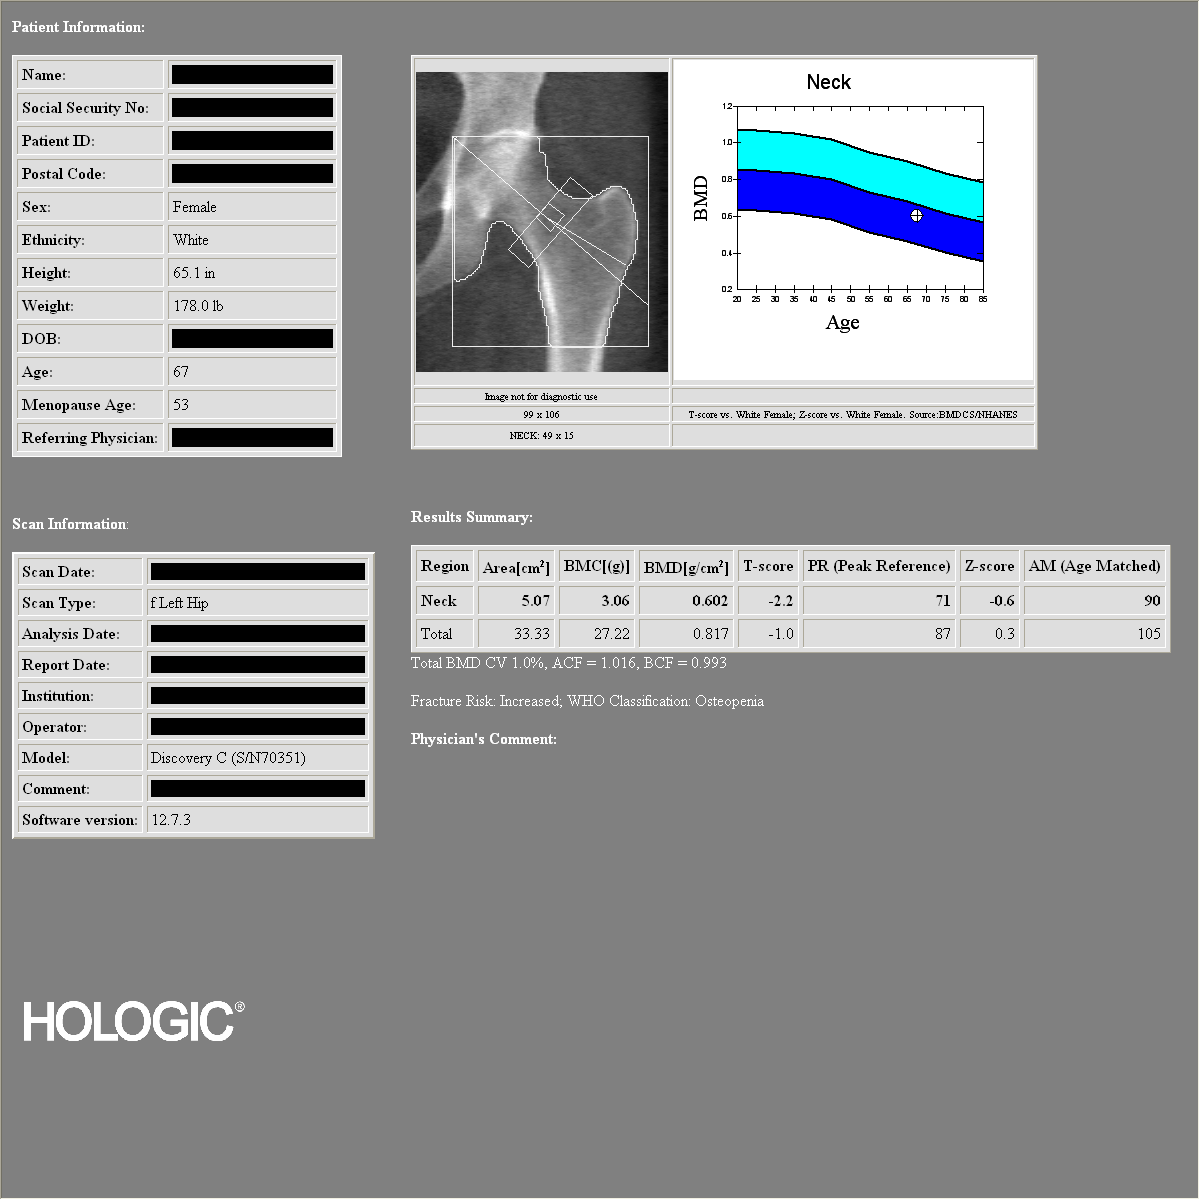

[1 of 1 positions shown; findings below may reference images not displayed]

DUAL X-RAY ABSORPTIOMETRY (DXA) FOR BONE MINERAL DENSITY

AP LUMBAR SPINE (L1 - L4)

Bone Mineral Density (BMD):            1.123 g/cm2
Young Adult T Score:
Z Score:

LEFT FEMUR (NECK)

Bone Mineral Density (BMD):             0.602 g/cm2
Young Adult T Score:                           -2.2
Z Score:                                                 -0.6

ASSESSMENT:  Patient's diagnostic category is LOW BONE MASS by WHO
Criteria.

FRACTURE RISK: MODERATE

FRAX: Based on the World Health Organization FRAX model, the 10
year probability of a major osteoporotic fracture is 18%.  The 10
year probability of a hip fracture is 3.2%.
RECOMMENDATIONS:

Effective therapies are available in the form of bisphosphonates,
selective estrogen receptor modulators, biologic agents, and
hormone replacement therapy (for women).  All patients should
ensure an adequate intake of dietary calcium (1200mg daily) and
vitamin D (800 [address] Tiger) unless contraindicated.

All treatment decisions require clinical judgement and
consideration of individual patient factors, including patient
preferences, co-morbidities, previous drug use, risk factors not
captured in the FRAX model (e.g., frailty, falls, vitamin D
deficiency, increased bone turnover, interval significant decline
in bone density) and possible under-or over-estimation of fracture
risk by FRAX.

The National Osteoporosis Foundation recommends that FDA-approved
medical therapies be considered in postmenopausal women and mean
age 50 or older with a:

      1)     Hip or vertebral (clinical or morphometric) fracture.

2)    T-score of -2.5 or lower at the spine or hip.
3)    Ten-year fracture probability by FRAX of 3% or greater for
hip fracture or 20% or greater for major osteoporotic fracture.
FOLLOW-UP:

People with diagnosed cases of osteoporosis or at high risk for
fracture should have regular bone mineral density tests.  For
patients eligible for Medicare, routine testing is allowed once
every 2 years.  The testing frequency can be increased to one year
for patients who have rapidly progressing disease, those who are
receiving or discontinuing medical therapy to restore bone mass, or
have additional risk factors.

World Health Organization (WHO) Criteria:

Normal: T scores from +1.0 to -1.0
Low Bone Mass (Osteopenia): T scores between -1.0 and -2.5
Osteoporosis: T scores -2.5 and below

Comparison to Reference Population:

T score is the key measure used in the diagnosis of osteoporosis
and relative risk determination for fracture.  It provides a value
for bone mass relative to the mean bone mass of a young adult
reference population expressed in terms of standard deviation (SD).

Z score is the age-matched score showing the patient's values
compared to a population matched for age, sex, and race.  This is
also expressed in terms of standard deviation.  The patient may
have values that compare favorably to the age-matched values and
still be at increased risk for fracture.

## 2010-10-24 ENCOUNTER — Telehealth: Payer: Self-pay | Admitting: Family Medicine

## 2010-10-26 ENCOUNTER — Ambulatory Visit (INDEPENDENT_AMBULATORY_CARE_PROVIDER_SITE_OTHER): Payer: Medicare Other | Admitting: Family Medicine

## 2010-10-26 ENCOUNTER — Encounter: Payer: Self-pay | Admitting: Family Medicine

## 2010-10-26 DIAGNOSIS — I10 Essential (primary) hypertension: Secondary | ICD-10-CM

## 2010-10-26 DIAGNOSIS — K219 Gastro-esophageal reflux disease without esophagitis: Secondary | ICD-10-CM

## 2010-10-26 DIAGNOSIS — K449 Diaphragmatic hernia without obstruction or gangrene: Secondary | ICD-10-CM

## 2010-10-26 HISTORY — DX: Gastro-esophageal reflux disease without esophagitis: K21.9

## 2010-11-02 NOTE — Assessment & Plan Note (Signed)
Summary: HTN, GERD   Vital Signs:  Patient profile:   70 year old female Height:      65.1 inches Weight:      188 pounds Pulse rate:   85 / minute BP sitting:   118 / 65  (right arm) Cuff size:   regular  Vitals Entered By: Avon Gully CMA, Duncan Dull) (October 26, 2010 10:57 AM) CC: f/u BP, Hypertension Management   Primary Care Provider:  Linford Arnold, C  CC:  f/u BP and Hypertension Management.  History of Present Illness: Last summer was inthe hospital in [place], [place] for chest pain. Had a w/u for MI.  W/U was neg. They also evaluated her Gallbladder.  It went away on its own.  She felt it was reflux. GEtting more burning in her mid-chest and takes 2 tums and it is better.  WAs on Aciphex years ago..  Worse with fried foods.    Inc urinary freqy for one year.  No dysuria.  No urgency. No hematuria. NO fever.   Hypertension History:      She denies headache, chest pain, palpitations, dyspnea with exertion, orthopnea, PND, peripheral edema, visual symptoms, neurologic problems, syncope, and side effects from treatment.  She notes no problems with any antihypertensive medication side effects.        Positive major cardiovascular risk factors include female age 86 years old or older and hypertension.  Negative major cardiovascular risk factors include negative family history for ischemic heart disease and non-tobacco-user status.     Current Medications (verified): 1)  Alprazolam 0.5 Mg Tabs (Alprazolam) .[name].. 1-2 Tabs By Mouth Daily 2)  Cozaar 50 Mg Tabs (Losartan Potassium) .... Take 1 Tablet By Mouth Once A Day (Generic Please) 3)  Vitamin D 16109 Unit  Caps (Ergocalciferol) .... One By Mouth Once A Week For 6 Months. 4)  Fish Oil 1000 Mg Caps (Omega-3 Fatty Acids) 5)  Fenoprofen Calcium 600 Mg Tabs (Fenoprofen Calcium)  Allergies (verified): 1)  Hydrochlorothiazide 2)  Lisinopril 3)  * Bisphosphonates.  Comments:  Nurse/Medical Assistant: The patient's medications and  allergies were reviewed with the patient and were updated in the Medication and Allergy Lists. Avon Gully CMA, Duncan Dull) (October 26, 2010 10:59 AM)  Past History:  Social History: Last updated: 05/26/2008 REtired from working at [employer].  Widowed.   Former Smoker Alcohol use-no Drug use-no Regular exercise-yes  Past Medical History: Vertigo Normal stress test  summer of 2011 - neg in [place], [place]  Physical Exam  General:  Well-developed,well-nourished,in no acute distress; alert,appropriate and cooperative throughout examination Head:  Normocephalic and atraumatic without obvious abnormalities. No apparent alopecia or balding. Lungs:  Normal respiratory effort, chest expands symmetrically. Lungs are clear to auscultation, no crackles or wheezes. Heart:  Normal rate and regular rhythm. S1 and S2 normal without gallop, murmur, click, rub or other extra sounds. Abdomen:  Bowel sounds positive,abdomen soft and non-tender without masses, organomegaly or hernias noted. Skin:  no rashes.  Blue nevus on teh left temple.  Cervical Nodes:  No lymphadenopathy noted Psych:  Cognition and judgment appear intact. Alert and cooperative with normal attention span and concentration. No apparent delusions, illusions, hallucinations   Impression & Recommendations:  Problem # 1:  ESSENTIAL HYPERTENSION, BENIGN (ICD-401.1)  Looks great today. At goal. Due for labs.  Her updated medication list for this problem includes:    Cozaar 50 Mg Tabs (Losartan potassium) .[name]... Take 1 tablet by mouth once a day (generic please)  Orders: T-Comprehensive  Metabolic Panel [phone]) T-Lipid Profile (09811-91478)  Problem # 2:  GERD (ICD-530.81) Assessment: New Discussed starting a PPI to control .  If sxs are not relieved in about 1-2 weeks then call the office and will make GI referal Discussed anti-reflux measures. She does drink caffeine daily.  Her updated medication list for this problem  includes:    Omeprazole 40 Mg Cpdr (Omeprazole) .[name]... Take 1 tablet by mouth once a day  Complete Medication List: 1)  Alprazolam 0.5 Mg Tabs (Alprazolam) .[name].. 1-2 tabs by mouth daily 2)  Cozaar 50 Mg Tabs (Losartan potassium) .... Take 1 tablet by mouth once a day (generic please) 3)  Vitamin D 29562 Unit Caps (Ergocalciferol) .... One by mouth once a week for 6 months. 4)  Fish Oil 1000 Mg Caps (Omega-3 fatty acids) 5)  Fenoprofen Calcium 600 Mg Tabs (Fenoprofen calcium) 6)  Omeprazole 40 Mg Cpdr (Omeprazole) .... Take 1 tablet by mouth once a day  Other Orders: T-Vitamin D (25-Hydroxy) ([id number]) Dermatology Referral (Derma)  Hypertension Assessment/Plan:      The patient's hypertensive risk group is category B: At least one risk factor (excluding diabetes) with no target organ damage.  Her calculated 10 year risk of coronary heart disease is 3 %.  Today's blood pressure is 118/65.  Her blood pressure goal is < 140/90.  Patient Instructions: 1)  Call if your burning in your chest is not better in 1-2 weeks.   2)  Please schedule a follow-up appointment in 6 months for blood pressure. .  Prescriptions: ALPRAZOLAM 0.5 MG TABS (ALPRAZOLAM) 1-2 tabs by mouth daily  #60 x 1   Entered and Authorized by:   Nani Gasser MD   Signed by:   Nani Gasser MD on 10/26/2010   Method used:   Printed then faxed to ...       [facility] [facility] [phone]* (retail)       [address]       [place], [place]  [address]       Ph: [phone] or [phone]       Fax: [phone]   RxID:   [id number] COZAAR 50 MG TABS (LOSARTAN POTASSIUM) Take 1 tablet by mouth once a day (generic please)  #90 x 3   Entered and Authorized by:   Nani Gasser MD   Signed by:   Nani Gasser MD on 10/26/2010   Method used:   Electronically to        [facility]  [address] [phone]* (retail)       [address] [place], [place]  [address]       Ph: [phone] or [phone]        Fax: [phone]   RxID:   [id number] OMEPRAZOLE 40 MG CPDR (OMEPRAZOLE) Take 1 tablet by mouth once a day  #30 x 1   Entered and Authorized by:   Nani Gasser MD   Signed by:   Nani Gasser MD on 10/26/2010   Method used:   Electronically to        [facility]  [address] [phone][facility] (retail)       [address] [place], [place]  [address]       Ph: [phone] or [phone]       Fax: [phone]  RxID:   [id number]    Orders Added: 1)  T-Comprehensive Metabolic Panel [[id number]] 2)  T-Lipid Profile [80061-22930] 3)  T-Vitamin D (25-Hydroxy) [29528-41324] 4)  Dermatology Referral [Derma] 5)  Est. Patient Level IV [[address]]

## 2010-11-02 NOTE — Progress Notes (Signed)
Summary: Cozaar refill  Phone Note Refill Request Call back at Home Phone [phone] Call back at [phone] Message from:  Patient on October 24, 2010 8:12 AM  Refills Requested: Medication #1:  COZAAR 50 MG TABS Take 1 tablet by mouth once a day (generic please)   Brand Name Necessary? No   Supply Requested: 1 month  Method Requested: Electronic Initial call taken by: Lannette Donath,  October 24, 2010 8:13 AM    Prescriptions: COZAAR 50 MG TABS (LOSARTAN POTASSIUM) Take 1 tablet by mouth once a day (generic please)  #30 Tablet x 0   Entered by:   Avon Gully CMA, (AAMA)   Authorized by:   Nani Gasser MD   Signed by:   Avon Gully CMA, (AAMA) on 10/25/2010   Method used:   Electronically to        [facility]  [address] [phone][facility] ([facility]ail)       [address]       [place], [place]  [address]       Ph: [phone] or [phone]       Fax: [phone]   RxID:   [id number]

## 2010-11-07 ENCOUNTER — Telehealth: Payer: Self-pay | Admitting: Family Medicine

## 2010-11-14 NOTE — Progress Notes (Signed)
  Phone Note Refill Request  on November 07, 2010 1:46 PM  Refills Requested: Medication #1:  COZAAR 50 MG TABS Take 1 tablet by mouth once a day (generic please) Initial call taken by: Avon Gully CMA, Duncan Dull),  November 07, 2010 1:47 PM    Prescriptions: COZAAR 50 MG TABS (LOSARTAN POTASSIUM) Take 1 tablet by mouth once a day (generic please)  #90 x 3   Entered by:   Avon Gully CMA, (AAMA)   Authorized by:   Nani Gasser MD   Signed by:   Avon Gully CMA, (AAMA) on 11/07/2010   Method used:   Electronically to        [facility]  [address] [phone]* (retail)       [address]       [place], [place]  [address]       Ph: [phone] or [phone]       Fax: [phone]   RxID:   [id number]

## 2010-12-29 ENCOUNTER — Other Ambulatory Visit: Payer: Self-pay | Admitting: Family Medicine

## 2011-01-03 ENCOUNTER — Other Ambulatory Visit: Payer: Self-pay | Admitting: Family Medicine

## 2011-01-03 LAB — COMPREHENSIVE METABOLIC PANEL
ALT: 15 U/L (ref 0–35)
AST: 19 U/L (ref 0–37)
Albumin: 4.2 g/dL (ref 3.5–5.2)
Alkaline Phosphatase: 70 U/L (ref 39–117)
BUN: 10 mg/dL (ref 6–23)
CO2: 26 mEq/L (ref 19–32)
Calcium: 9.9 mg/dL (ref 8.4–10.5)
Chloride: 107 mEq/L (ref 96–112)
Creat: 0.84 mg/dL (ref 0.40–1.20)
Glucose, Bld: 98 mg/dL (ref 70–99)
Potassium: 4 mEq/L (ref 3.5–5.3)
Sodium: 142 mEq/L (ref 135–145)
Total Bilirubin: 0.8 mg/dL (ref 0.3–1.2)
Total Protein: 6.5 g/dL (ref 6.0–8.3)

## 2011-01-03 LAB — LIPID PANEL
Cholesterol: 198 mg/dL (ref 0–200)
HDL: 60 mg/dL (ref >39)
LDL Cholesterol: 122 mg/dL — ABNORMAL HIGH (ref 0–99)
Total CHOL/HDL Ratio: 3.3 Ratio
Triglycerides: 81 mg/dL (ref <150)
VLDL: 16 mg/dL (ref 0–40)

## 2011-01-04 ENCOUNTER — Telehealth: Payer: Self-pay | Admitting: Family Medicine

## 2011-01-04 LAB — VITAMIN D 25 HYDROXY (VIT D DEFICIENCY, FRACTURES): Vit D, 25-Hydroxy: 38 ng/mL (ref 30–89)

## 2011-01-04 NOTE — Telephone Encounter (Signed)
Pt notified and will mail labs in the mail

## 2011-01-04 NOTE — Telephone Encounter (Signed)
Operation: Complete metabolic panel looks great. Her LDL cholesterol is up a little bit. It looked better last time. Her vitamin D was okay at 58 but is normal i normal. I do recommend she take vitamin D 1000 units daily for maintenance.

## 2011-01-12 NOTE — Op Note (Signed)
NAMEBAYAN, HEDSTROM            ACCOUNT NO.:  [id number]   MEDICAL RECORD NO.:  [id number]          PATIENT TYPE:  AMB   LOCATION:  ENDO                         FACILITY:  [facility]   PHYSICIAN:  Anselmo Rod, M.D.  DATE OF BIRTH:  04-30-1941   DATE OF PROCEDURE:  02/14/2005  DATE OF DISCHARGE:                                 OPERATIVE REPORT   PROCEDURE PERFORMED:  Screening colonoscopy.   ENDOSCOPIST:  Anselmo Rod, M.D.   INSTRUMENT USED:  Olympus video colonoscope.   INDICATIONS FOR PROCEDURE:  A 70 year old white female undergoing screening  colonoscopy to rule out colonic polyps, masses, etc.   PRE-PROCEDURE PREPARATION:  Informed consent was procured from the patient.  The patient was fasted for eight hours prior to the procedure and prepped  with a bottle of magnesium citrate and a gallon of GoLYTELY the night prior  to the procedure.  The risks and benefits of the procedure including a 10%  miss rate of cancer and polyps were discussed with the patient as well.   PRE-PROCEDURE PHYSICAL:  VITAL SIGNS:  The patient had stable vital signs.  NECK:  Supple.  CHEST:  Clear to auscultation.  CARDIOVASCULAR:  S1, S2 regular.  ABDOMEN:  Soft, with normal bowel sounds.   DESCRIPTION OF THE PROCEDURE:  The patient was placed in the left lateral  decubitus position, sedated with 70 mg of Demerol and 7.5 mg of Versed in  slow incremental doses.  Once the patient was adequately sedated and  maintained on low-flow oxygen and continuous cardiac monitoring, the Olympus  video colonoscope was advanced from the rectum to the cecum.  The  appendiceal orifice and the ileocecal valve were clearly visualized and  photographed.  The terminal ileum appeared healthy.  An isolated  diverticulum was seen in the left colon.  Small internal hemorrhoids were  appreciated on retroflexion in the rectum.  The rest of the exam was  unremarkable.  No masses or polyps were seen. The patient  tolerated the  procedure well, without immediate complications.   IMPRESSION:  Normal colonoscopy to the terminal ileum, except for small  internal hemorrhoids and isolated diverticulum in the left colon.   RECOMMENDATIONS:  1.  Continue a high-fiber diet, with liberal fluid intake.  2.  Repeat colonoscopy in the next 10 years unless the patient develops any      abnormal symptoms in the interim.  3.  Outpatient followup as the need arises in the future.       JNM/MEDQ  D:  02/14/2005  T:  02/14/2005  Job:  [id number]   cc:   Teena Irani. Arlyce Dice, M.D.  [address]  [place]  [place] [address]  Fax: [phone]   Huel Cote, M.D.  [address] [place], [address]  [place], [place] [address]  Fax: [phone]

## 2011-01-12 NOTE — Procedures (Signed)
[place]  Patient:    Christina [name], Christina [name]                   MRN: [id number] Proc. Date: 01/31/00 Adm. Date:  [id number] Disc. Date: [id number] Attending:  Charna Elizabeth CC:         Teena Irani. Arlyce Dice, M.D., [facility]             Londell Moh, [facility]                           Procedure Report  DATE OF BIRTH:  1941/02/16  REFERRING PHYSICIAN:  Teena Irani. Arlyce Dice, M.D.  PROCEDURE PERFORMED:  Colonoscopy.  ENDOSCOPIST:  Anselmo Rod, M.D.  INSTRUMENT USED:  Olympus video colonoscope.  INDICATIONS:  Blood in stool and change in bowel habits in a 70 year old white female.  Rule out polyps, masses, hemorrhoids, etc.  PREPROCEDURE PREPARATION:  Informed consent was procured from the patient. The patient was fasted for 8 hours prior to the procedure and prepped with a bottle of magnesium citrate and a gallon of NuLytely the night prior to the procedure.  PREPROCEDURE PHYSICAL:  Patient has stable vital signs.  NECK: Supple.  CHEST:  Clear to auscultation. S1, S2 regular.  ABDOMEN:  Soft with normal abdominal bowel sounds.  DESCRIPTION OF PROCEDURE:  The patient was placed in left lateral decubitus position and sedated with 90 mg of Demerol and 7 mg of Versed intravenously. Once the patient was adequately sedated and maintained on low-flow oxygen and continuous cardiac monitoring, the Olympus video colonoscope was advanced from the rectum to the cecum without difficulty.  The procedure was completed up to the cecum.  The appendiceal orifice and the ileocecal valve were clearly visualized.  No masses, polyps, erosions or ulcerations were seen.  There was no evidence of diverticular disease.  Patient had small external and internal hemorrhoids seen on anal inspection and retroflexion in the rectum respectively.  These were nonbleeding at the time of examination. The patient tolerated the procedure well  without complication.  IMPRESSION:  Essentially normal colonoscopy, except for small, nonbleeding internal and external hemorrhoids.  RECOMMENDATIONS: 1. The patient has been advised to increase the fluid and fiber in her diet. 2. Outpatient follow-up was advised in the next two weeks for further    recommendations. DD:  01/31/00 TD:  02/02/00 Job: [id number]

## 2011-01-31 ENCOUNTER — Telehealth: Payer: Self-pay | Admitting: Family Medicine

## 2011-02-05 NOTE — Telephone Encounter (Signed)
Closed encounter °

## 2011-03-08 ENCOUNTER — Other Ambulatory Visit: Payer: Self-pay | Admitting: Family Medicine

## 2011-03-13 ENCOUNTER — Encounter: Payer: Self-pay | Admitting: Family Medicine

## 2011-03-14 ENCOUNTER — Ambulatory Visit (INDEPENDENT_AMBULATORY_CARE_PROVIDER_SITE_OTHER): Payer: Medicare Other | Admitting: Family Medicine

## 2011-03-14 ENCOUNTER — Encounter: Payer: Self-pay | Admitting: Family Medicine

## 2011-03-14 VITALS — BP 134/64 | HR 96 | Ht 65.5 in | Wt 188.0 lb

## 2011-03-14 DIAGNOSIS — R300 Dysuria: Secondary | ICD-10-CM

## 2011-03-14 DIAGNOSIS — D229 Melanocytic nevi, unspecified: Secondary | ICD-10-CM

## 2011-03-14 DIAGNOSIS — I10 Essential (primary) hypertension: Secondary | ICD-10-CM

## 2011-03-14 DIAGNOSIS — D2370 Other benign neoplasm of skin of unspecified lower limb, including hip: Secondary | ICD-10-CM

## 2011-03-14 DIAGNOSIS — F39 Unspecified mood [affective] disorder: Secondary | ICD-10-CM

## 2011-03-14 LAB — POCT URINALYSIS DIPSTICK
Bilirubin, UA: NEGATIVE
Blood, UA: NEGATIVE
Glucose, UA: NEGATIVE
Ketones, UA: NEGATIVE
Leukocytes, UA: NEGATIVE
Nitrite, UA: NEGATIVE
Protein, UA: NEGATIVE
Spec Grav, UA: 1.015
Urobilinogen, UA: 0.2
pH, UA: 6

## 2011-03-14 MED ORDER — ALPRAZOLAM 0.5 MG PO TABS: 0.5000 mg | ORAL_TABLET | Freq: Every day | ORAL | Status: DC

## 2011-03-14 NOTE — Assessment & Plan Note (Signed)
Will refill her xanax 

## 2011-03-14 NOTE — Progress Notes (Signed)
  Subjective:    Patient ID: Christina [name], female    DOB: April 15, 1941, 70 y.o.   MRN: [id number]  Hypertension This is a chronic problem. The current episode started more than 1 year ago. The problem is controlled. Pertinent negatives include no blurred vision, chest pain or shortness of breath. There are no associated agents to hypertension. Risk factors for coronary artery disease include no known risk factors. Past treatments include angiotensin blockers. There are no compliance problems.    c/o has a lesion on her left upper thigh that she would like to be removed. It has been eval before and she knows it is a benign lesion it just bothers her and she doesn't like the fact that it sticks up and you can see it through her pants.   Also c/o of dysuria for several day. No fever. No hematuria  Review of Systems  Eyes: Negative for blurred vision.  Respiratory: Negative for shortness of breath.   Cardiovascular: Negative for chest pain.       Objective:   Physical Exam  Constitutional: She is oriented to person, place, and time. She appears well-developed and well-nourished.  HENT:  Head: Normocephalic and atraumatic.  Neck: Neck supple. No thyromegaly present.  Cardiovascular: Normal rate, regular rhythm and normal heart sounds.        No carotid bruits.   Pulmonary/Chest: Effort normal and breath sounds normal.  Musculoskeletal: She exhibits no edema.  Lymphadenopathy:    She has no cervical adenopathy.  Neurological: She is alert and oriented to person, place, and time.  Skin: Skin is warm and dry.  Psychiatric: She has a normal mood and affect. Her behavior is normal.          Assessment & Plan:   Shave Biopsy Procedure Note  Pre-operative Diagnosis: Suspicious lesion  Post-operative Diagnosis: same  Locations:left thigh  Indications: Pt prefers re andmoval.   Anesthesia: Lidocaine 2% with epinephrine without added sodium bicarbonate  Procedure Details    History of allergy to iodine: no  Patient informed of the risks (including bleeding and infection) and benefits of the  procedure and Verbal informed consent obtained.  The lesion and surrounding area were given a sterile prep using betadyne and draped in the usual sterile fashion. A scalpel was used to shave an area of skin approximately 1.1cm by 1.1cm.  Hemostasis achieved with alumuninum chloride. Antibiotic ointment and a sterile dressing applied.  The specimen was sent for pathologic examination. The patient tolerated the procedure well.  EBL: 0 ml  Findings: Will await pathology  Condition: Stable  Complications: none.  Plan: 1. Instructed to keep the wound dry and covered for 24-48h and clean thereafter. 2. Warning signs of infection were reviewed.   3. Recommended that the patient use OTC acetaminophen as needed for pain.  4. Return in 1-2 weeks if needed if wound is not healing well. Vaseline daily to cover with a Band-Aid for 2-3 weeks. This will permit less scarring.

## 2011-03-14 NOTE — Assessment & Plan Note (Signed)
At goal today.  Labs done in May.  F/u in 6 mo.

## 2011-03-19 ENCOUNTER — Other Ambulatory Visit: Payer: Self-pay | Admitting: Family Medicine

## 2011-03-19 ENCOUNTER — Telehealth: Payer: Self-pay | Admitting: Family Medicine

## 2011-03-19 NOTE — Telephone Encounter (Signed)
Ok to correct in the computer.  This should have been caught when we verify meds before the OV.  OK for #60 tabs but only give 2 refills instead. Thank you.

## 2011-03-19 NOTE — Telephone Encounter (Signed)
Called the pharm and gave a new script due to insurance purposes.  Told the pharm to change the script to xanex 0.5 mg twice daily , #60/2 refills instead of what was sent on 03-14-11.  Pharm took a verbal, Jarvis Newcomer, LPN [facility]

## 2011-03-19 NOTE — Telephone Encounter (Signed)
Triage nurse also reconciled the Rx and changed the directions on the med list. Jarvis Newcomer, LPN [facility]

## 2011-03-19 NOTE — Telephone Encounter (Signed)
Closed

## 2011-03-19 NOTE — Telephone Encounter (Signed)
Pt called and inquiring about her xanex 0.5 mg daily that was ordered on 03-14-11.  Pt has called and stating it is suppose to be twice daily.  Can you please advise as the script was ordered when pt last seen As once daily. Jarvis Newcomer, LPN [facility]

## 2011-03-29 ENCOUNTER — Telehealth: Payer: Self-pay | Admitting: Family Medicine

## 2011-03-29 NOTE — Telephone Encounter (Signed)
Loney Loh has been called, but I think we are going to talk with Marcelino Duster tomorrow to see since she probably did the procedure with you if she remembers. Jarvis Newcomer, LPN [facility]

## 2011-03-29 NOTE — Telephone Encounter (Signed)
Pt called and said she had a place on her leg removed approx 2 wks ago and wants to know the results of the pathology report.  She is going out of town and wanted to get results before she leaves.  Can you please advise since I do not see a telephone note or result in pt chart as of yet.  Pt also said after we discussed straightening out her xanex script a couple weeks ago, that she went  To pup at [facility] and they still filled as # 30 instead of # 60.  The pharm had been called and gave a V.O on this so pt was instructed since med not used to take the bottle back to [facility] and have the pharmacist call me while she is there, and I will get straightened out.  Pt voiced understanding. Jarvis Newcomer, LPN [facility] Routed to Dr. Linford Arnold

## 2011-03-29 NOTE — Telephone Encounter (Signed)
I would say lets call [facility] first.

## 2011-03-29 NOTE — Telephone Encounter (Signed)
I called [facility] to obtain path report and they do not show as received.  They are thinking the courier had picked up and taken to wrong place by accident or a courier that should have not taken has picked up by mistake.  I am not sure how to start tracking it down.  Any suggestions??? Routed to Dr. Marlyne Beards, LPN [facility]

## 2011-03-29 NOTE — Telephone Encounter (Signed)
Her pharm was personally called and the rx was corrected on 03-19-11. Let call for the bx report. I don't see it in the computer.

## 2011-03-30 NOTE — Telephone Encounter (Signed)
I prepared supplies for Dr. Judie Petit for this Pt but did not assist w/ procedure. I do not recall seeing path jar and I did not fill out path sheet. I do not think this was ever sent to [facility]. I called Pt to apologize and explain what I thought happened. Pt advised by Dr. Judie Petit to continue monitoring and let us know if she has any problems. If mole returns we will send to derm. Pt agrees and accepts our apology for the confusion.

## 2011-03-30 NOTE — Telephone Encounter (Signed)
Routed to [employer], CMA. Jarvis Newcomer, LPN [facility]

## 2011-05-09 ENCOUNTER — Other Ambulatory Visit: Payer: Self-pay | Admitting: Family Medicine

## 2011-05-28 LAB — HM MAMMOGRAPHY

## 2011-06-01 ENCOUNTER — Other Ambulatory Visit: Payer: Self-pay | Admitting: Family Medicine

## 2011-06-01 DIAGNOSIS — Z1231 Encounter for screening mammogram for malignant neoplasm of breast: Secondary | ICD-10-CM

## 2011-06-12 ENCOUNTER — Ambulatory Visit
Admission: RE | Admit: 2011-06-12 | Discharge: 2011-06-12 | Disposition: A | Discharge status: Home / Self Care | Payer: Medicare Other | Source: Ambulatory Visit | Attending: Family Medicine | Admitting: Family Medicine

## 2011-06-12 DIAGNOSIS — Z1231 Encounter for screening mammogram for malignant neoplasm of breast: Secondary | ICD-10-CM

## 2011-06-26 ENCOUNTER — Ambulatory Visit: Payer: Medicare Other

## 2011-08-01 ENCOUNTER — Other Ambulatory Visit: Payer: Self-pay | Admitting: *Deleted

## 2011-08-01 MED ORDER — ALPRAZOLAM 0.5 MG PO TABS: 0.5000 mg | ORAL_TABLET | Freq: Two times a day (BID) | ORAL | Status: DC

## 2011-09-28 ENCOUNTER — Ambulatory Visit (INDEPENDENT_AMBULATORY_CARE_PROVIDER_SITE_OTHER): Payer: Medicare Other | Admitting: Family Medicine

## 2011-09-28 ENCOUNTER — Encounter: Payer: Self-pay | Admitting: Family Medicine

## 2011-09-28 VITALS — BP 140/80 | HR 88 | Temp 98.6°F | Ht 65.0 in | Wt 192.8 lb

## 2011-09-28 DIAGNOSIS — R350 Frequency of micturition: Secondary | ICD-10-CM

## 2011-09-28 DIAGNOSIS — R002 Palpitations: Secondary | ICD-10-CM

## 2011-09-28 DIAGNOSIS — IMO0001 Reserved for inherently not codable concepts without codable children: Secondary | ICD-10-CM

## 2011-09-28 DIAGNOSIS — R1902 Left upper quadrant abdominal swelling, mass and lump: Secondary | ICD-10-CM

## 2011-09-28 DIAGNOSIS — I10 Essential (primary) hypertension: Secondary | ICD-10-CM | POA: Insufficient documentation

## 2011-09-28 DIAGNOSIS — G4700 Insomnia, unspecified: Secondary | ICD-10-CM | POA: Insufficient documentation

## 2011-09-28 DIAGNOSIS — E669 Obesity, unspecified: Secondary | ICD-10-CM

## 2011-09-28 DIAGNOSIS — Z23 Encounter for immunization: Secondary | ICD-10-CM

## 2011-09-28 DIAGNOSIS — K219 Gastro-esophageal reflux disease without esophagitis: Secondary | ICD-10-CM

## 2011-09-28 HISTORY — DX: Frequency of micturition: R35.0

## 2011-09-28 LAB — POCT URINALYSIS DIPSTICK
Bilirubin, UA: NEGATIVE
Glucose, UA: NEGATIVE
Ketones, UA: NEGATIVE
Nitrite, UA: NEGATIVE
Protein, UA: NEGATIVE
Spec Grav, UA: 1.015
Urobilinogen, UA: 0.2
pH, UA: 7

## 2011-09-28 MED ORDER — OMEPRAZOLE 40 MG PO CPDR: 40.0000 mg | DELAYED_RELEASE_CAPSULE | ORAL | Status: DC | PRN

## 2011-09-28 MED ORDER — ALPRAZOLAM 0.5 MG PO TABS: 0.5000 mg | ORAL_TABLET | Freq: Two times a day (BID) | ORAL | Status: DC

## 2011-09-28 NOTE — Assessment & Plan Note (Signed)
No recent episodes or concerns. 

## 2011-09-28 NOTE — Assessment & Plan Note (Signed)
Uses Alprazolam prn for sleep, is given a refill. It works well most nights but occasionally she has a restless night. She may take a second pill on a rough night infrequently

## 2011-09-28 NOTE — Assessment & Plan Note (Signed)
Notes a skipped heart beat a couple times each week, always happens in evenings and only lasts a second. She takes a quick breath and it is gone, it is never sustained or assoiciated with diaphoresis, sob, nausea. Likely PVCs patient advised to notify us if they become longer in duration or other symptoms become associated.

## 2011-09-28 NOTE — Patient Instructions (Signed)
Gallbladder Disease Gallbladder disease (cholecystitis) is an inflammation of your gallbladder. It is usually caused by a build-up of stones (gallstones) or sludge (cholelithiasis) in your gallbladder. The gallbladder is not an essential organ. It is located slightly to the right of center in the belly (abdomen), behind the liver. It stores bile made in the liver. Bile aids in digestion of fats. Gallbladder disease may result in nausea (feeling sick to your stomach), abdominal pain, and jaundice. In severe cases, emergency surgery may be required. The most common type of gallbladder disease is gallstones. They begin as small crystals and slowly grow into stones. Gallstone pain occurs when the bile duct has spasms. The spasms are caused by the stone passing out of the duct. The stone is trying to pass at the same time bile is passing into the small bowel for digestion. The pain usually begins suddenly. It may persist from several minutes to several hours. Infection can occur. Infection can add to discomfort and severity of an acute attack. The pain may be made worse by breathing deeply or by being jarred. There may be fever and tenderness to the touch. In some cases, when gallstones do not move into the bile duct, people have no pain or symptoms. These are called "silent" gallstones. Women are three times more likely to develop gallstones than men. Women who have had several pregnancies are more likely to have gallbladder disease. Physicians sometimes advise removing diseased gallbladders before future pregnancies. Other factors that increase the risk of gallbladder disease are obesity, diets heavy in fried foods and dairy products, increasing age, prolonged use of medications containing female hormones, and heredity. HOME CARE INSTRUCTIONS   If your physician prescribed an antibiotic, take as directed.   Only take over-the-counter or prescription medicines for pain, discomfort, or fever as directed by your  caregiver.   Follow a low fat diet until seen again. (Fat causes the gallbladder to contract.)   Follow-up as instructed. Attacks are almost always recurrent and surgery is usually required for permanent treatment.  SEEK IMMEDIATE MEDICAL CARE IF:   Pain is increasing and not controlled by medications.   The pain moves to another part of your abdomen or to your back. (Right sided pain can be appendicitis and left sided pain in adults can be diverticulitis).   You have a fever.   You develop nausea and vomiting.  Document Released: 08/13/2005 Document Revised: 04/25/2011 Document Reviewed: 04/01/2008 [facility] Patient Information 2012 [place], [place].Preventative Care for Adults, Female A healthy lifestyle and preventative care can promote health and wellness. Preventative health guidelines for women include the following key practices:  A routine yearly physical is a good way to check with your caregiver about your health and preventative screening. It is a chance to share any concerns and updates on your health, and to receive a thorough exam.   Visit your dentist for a routine exam and preventative care every 6 months. Brush your teeth twice a day and floss once a day. Good oral hygiene prevents tooth decay and gum disease.   The frequency of eye exams is based on your age, health, family medical history, use of contact lenses, and other factors. Follow your caregiver's recommendations for frequency of eye exams.   Eat a healthy diet. Foods like vegetables, fruits, whole grains, low-fat dairy products, and lean protein foods contain the nutrients you need without too many calories. Decrease your intake of foods high in solid fats, added sugars, and salt. Eat the right amount of  calories for you.Get information about a proper diet from your caregiver, if necessary.   Regular physical exercise is one of the most important things you can do for your health. Most adults should get at least  150 minutes of moderate-intensity exercise (any activity that increases your heart rate and causes you to sweat) each week. In addition, most adults need muscle-strengthening exercises on 2 or more days a week.   Maintain a healthy weight. The body mass index (BMI) is a screening tool to identify possible weight problems. It provides an estimate of body fat based on height and weight. Your caregiver can help determine your BMI, and can help you achieve or maintain a healthy weight.For adults 20 years and older:   A BMI below 18.5 is considered underweight.   A BMI of 18.5 to 24.9 is normal.   A BMI of 25 to 29.9 is considered overweight.   A BMI of 30 and above is considered obese.   Maintain normal blood lipids and cholesterol levels by exercising and minimizing your intake of saturated fat. Eat a balanced diet with plenty of fruit and vegetables. Blood tests for lipids and cholesterol should begin at age 46 and be repeated every 5 years. If your lipid or cholesterol levels are high, you are over 50, or you are a high risk for heart disease, you may need your cholesterol levels checked more frequently.Ongoing high lipid and cholesterol levels should be treated with medicines if diet and exercise are not effective.   If you smoke, find out from your caregiver how to quit. If you do not use tobacco, do not start.   If you are pregnant, do not drink alcohol. If you are breastfeeding, be very cautious about drinking alcohol. If you are not pregnant and choose to drink alcohol, do not exceed 1 drink per day. One drink is considered to be 12 ounces (355 mL) of beer, 5 ounces (148 mL) of wine, or 1.5 ounces (44 mL) of liquor.   Avoid use of street drugs. Do not share needles with anyone. Ask for help if you need support or instructions about stopping the use of drugs.   High blood pressure causes heart disease and increases the risk of stroke. Your blood pressure should be checked at least every 1 to  2 years. Ongoing high blood pressure should be treated with medicines if weight loss and exercise are not effective.   If you are 57 to 70 years old, ask your caregiver if you should take aspirin to prevent strokes.   Diabetes screening involves taking a blood sample to check your fasting blood sugar level. This should be done once every 3 years, after age 40, if you are within normal weight and without risk factors for diabetes. Testing should be considered at a younger age or be carried out more frequently if you are overweight and have at least 1 risk factor for diabetes.   Breast cancer screening is essential preventative care for women. You should practice "breast self-awareness." This means understanding the normal appearance and feel of your breasts and may include breast self-examination. Any changes detected, no matter how small, should be reported to a caregiver. Women in their 3s and 30s should have a clinical breast exam (CBE) by a caregiver as part of a regular health exam every 1 to 3 years. After age 15, women should have a CBE every year. Starting at age 82, women should consider having a mammogram (breast X-ray) every year. Women  who have a family history of breast cancer should talk to their caregiver about genetic screening. Women at a high risk of breast cancer should talk to their caregiver about having an MRI and a mammogram every year.   The Pap test is a screening test for cervical cancer. A Pap test can show cell changes on the cervix that might become cervical cancer if left untreated. A Pap test is a procedure in which cells are obtained and examined from the lower end of the uterus (cervix).   Women should have a Pap test starting at age 56.   Between ages 91 and 44, Pap tests should be repeated every 2 years.   Beginning at age 21, you should have a Pap test every 3 years as long as the past 3 Pap tests have been normal.   Some women have medical problems that increase  the chance of getting cervical cancer. Talk to your caregiver about these problems. It is especially important to talk to your caregiver if a new problem develops soon after your last Pap test. In these cases, your caregiver may recommend more frequent screening and Pap tests.   The above recommendations are the same for women who have or have not gotten the vaccine for human papillomavirus (HPV).   If you had a hysterectomy for a problem that was not cancer or a condition that could lead to cancer, then you no longer need Pap tests. Even if you no longer need a Pap test, a regular exam is a good idea to make sure no other problems are starting.   If you are between ages 29 and 12, and you have had normal Pap tests going back 10 years, you no longer need Pap tests. Even if you no longer need a Pap test, a regular exam is a good idea to make sure no other problems are starting.   If you have had past treatment for cervical cancer or a condition that could lead to cancer, you need Pap tests and screening for cancer for at least 20 years after your treatment.   If Pap tests have been discontinued, risk factors (such as a new sexual partner) need to be reassessed to determine if screening should be resumed.   The HPV test is an additional test that may be used for cervical cancer screening. The HPV test looks for the virus that can cause the cell changes on the cervix. The cells collected during the Pap test can be tested for HPV. The HPV test could be used to screen women aged 26 years and older, and should be used in women of any age who have unclear Pap test results. After the age of 75, women should have HPV testing at the same frequency as a Pap test.   Colorectal cancer can be detected and often prevented. Most routine colorectal cancer screening begins at the age of 58 and continues through age 57. However, your caregiver may recommend screening at an earlier age if you have risk factors for colon  cancer. On a yearly basis, your caregiver may provide home test kits to check for hidden blood in the stool. Use of a small camera at the end of a tube, to directly examine the colon (sigmoidoscopy or colonoscopy), can detect the earliest forms of colorectal cancer. Talk to your caregiver about this at age 68, when routine screening begins. Direct examination of the colon should be repeated every 5 to 10 years through age 36, unless early  forms of pre-cancerous polyps or small growths are found.   Practice safe sex. Use condoms and avoid high-risk sexual practices to reduce the spread of sexually transmitted infections (STIs). STIs include gonorrhea, chlamydia, syphilis, trichomonas, herpes, HPV, and human immunodeficiency virus (HIV). Herpes, HIV, and HPV are viral illnesses that have no cure. They can result in disability, cancer, and death. Sexually active women aged 56 and younger should be checked for Chlamydia. Older women with new or multiple partners should also be tested for Chlamydia. Testing for other STIs is recommended if you are sexually active and at increased risk.   Osteoporosis is a disease in which the bones lose minerals and strength with aging. This can result in serious bone fractures. The risk of osteoporosis can be identified using a bone density scan. Women ages 54 and over and women at risk for fractures or osteoporosis should discuss screening with their caregivers. Ask your caregiver whether you should take a calcium supplement or vitamin D to reduce the rate of osteoporosis.   Menopause can be associated with physical symptoms and risks. Hormone replacement therapy is available to decrease symptoms and risks. You should talk to your caregiver about whether hormone replacement therapy is right for you.   Use sunscreen with skin protection factor (SPF) of 30 or more. Apply sunscreen liberally and repeatedly throughout the day. You should seek shade when your shadow is shorter than  you. Protect yourself by wearing long sleeves, pants, a wide-brimmed hat, and sunglasses year round, whenever you are outdoors.   Once a month, do a whole body skin exam, using a mirror to look at the skin on your back. Notify your caregiver of new moles, moles that have irregular borders, moles that are larger than a pencil eraser, or moles that have changed in shape or color.   Stay current with required immunizations.   Influenza. You need a dose every fall (or winter). The composition of the flu vaccine changes each year, so being vaccinated once is not enough.   Pneumococcal polysaccharide. You need 1 to 2 doses if you smoke cigarettes or if you have certain chronic medical conditions. You need 1 dose at age 72 (or older) if you have never been vaccinated.   Tetanus, diphtheria, pertussis (Tdap, Td). Get 1 dose of Tdap vaccine if you are younger than age 9 years, are over 61 and have contact with an infant, are a Research scientist (physical sciences), are pregnant, or simply want to be protected from whooping cough. After that, you need a Td booster dose every 10 years. Consult your caregiver if you have not had at least 3 tetanus and diphtheria-containing shots sometime in your life or have a deep or dirty wound.   HPV. You need this vaccine if you are a woman age 77 years or younger. The vaccine is given in 3 doses over 6 months.   Measles, mumps, rubella (MMR). You need at least 1 dose of MMR if you were born in 1957 or later. You may also need a 2nd dose.   Meningococcal. If you are age 19 to 57 years and a Orthoptist living in a residence hall, or have one of several medical conditions, you need to get vaccinated against meningococcal disease. You may also need additional booster doses.   Zoster (shingles). If you are age 5 years or older, you should get this vaccine.   Varicella (chickenpox). If you have never had chickenpox or you were vaccinated but received only 1 dose,  talk to your  caregiver to find out if you need this vaccine.   Hepatitis A. You need this vaccine if you have a specific risk factor for hepatitis A virus infection or you simply wish to be protected from this disease. The vaccine is usually given as 2 doses, 6 to 18 months apart.   Hepatitis B. You need this vaccine if you have a specific risk factor for hepatitis B virus infection or you simply wish to be protected from this disease. The vaccine is given in 3 doses, usually over 6 months.  Preventative Services / Frequency Ages 28 to 87  Blood pressure check.** / Every 1 to 2 years.   Lipid and cholesterol check.**/ Every 5 years beginning at age 73.   Clinical breast exam.** / Every 3 years for women in their 33s and 30s.   Pap Test.** / Every 2 years from ages 22 through 36. Every 3 years starting at age 74 years through age 53 or 68 with a history of 3 consecutive normal Pap tests.   HPV Screening.** / Every 3 years from ages 27 through ages 48 to 47 with a history of 3 consecutive normal Pap tests.   Skin self-exam. / Monthly.   Influenza immunization.** / Every year.   Pneumococcal polysaccharide immunization.** / 1 to 2 doses if you smoke cigarettes or if you have certain chronic medical conditions.   Tetanus, diphtheria, pertussis (Tdap,Td) immunization. / A one-time dose of Tdap vaccine. After that, you need a Td booster dose every 10 years.   HPV immunization. / 3 doses over 6 months, if 26 and younger.   Measles, mumps, rubella (MMR) immunization. / You need at least 1 dose of MMR if you were born in 1957 or later. You may also need a 2nd dose.   Meningococcal immunization. / 1 dose if you are age 41 to 53 years and a Orthoptist living in a residence hall, or have one of several medical conditions, you need to get vaccinated against meningococcal disease. You may also need additional booster doses.   Varicella immunization. **/ Consult your caregiver.   Hepatitis A  immunization. ** / Consult your caregiver. 2 doses, 6 to 18 months apart.   Hepatitis B immunization.** / Consult your caregiver. 3 doses usually over 6 months.  Ages 31 to 72  Blood pressure check.** / Every 1 to 2 years.   Lipid and cholesterol check.**/ Every 5 years beginning at age 33.   Clinical breast exam.** / Every year after age 41.   Mammogram.** / Every year beginning at age 63 and continuing for as long as you are in good health. Consult with your caregiver.   Pap Test.** / Every 3 years starting at age 67 years through age 46 or 29 with a history of 3 consecutive normal Pap tests.   HPV Screening.** / Every 3 years from ages 62 through ages 52 to 55 with a history of 3 consecutive normal Pap tests.   Fecal occult blood test (FOBT) of stool. / Every year beginning at age 55 and continuing until age 2. You may not have to do this test if you get colonoscopy every 10 years.   Flexible sigmoidoscopy** or colonoscopy.** / Every 5 years for a flexible sigmoidoscopy or every 10 years for a colonoscopy beginning at age 58 and continuing until age 31.   Skin self-exam. / Monthly.   Influenza immunization.** / Every year.   Pneumococcal polysaccharide immunization.** / 1  to 2 doses if you smoke cigarettes or if you have certain chronic medical conditions.   Tetanus, diphtheria, pertussis (Tdap/Td) immunization.** / A one-time dose of Tdap vaccine. After that, you need a Td booster dose every 10 years.   Measles, mumps, rubella (MMR) immunization. / You need at least 1 dose of MMR if you were born in 1957 or later. You may also need a 2nd dose.   Varicella immunization. **/ Consult your caregiver.   Meningococcal immunization.** / Consult your caregiver.     Hepatitis A immunization. ** / Consult your caregiver. 2 doses, 6 to 18 months apart.   Hepatitis B immunization.** / Consult your caregiver. 3 doses, usually over 6 months.  Ages 2 and over  Blood pressure  check.** / Every 1 to 2 years.   Lipid and cholesterol check.**/ Every 5 years beginning at age 22.   Clinical breast exam.** / Every year after age 52.   Mammogram.** / Every year beginning at age 73 and continuing for as long as you are in good health. Consult with your caregiver.   Pap Test,** / Every 3 years starting at age 47 years through age 19 or 52 with a 3 consecutive normal Pap tests. Testing can be stopped between 65 and 70 with 3 consecutive normal Pap tests and no abnormal Pap or HPV tests in the past 10 years.   HPV Screening.** / Every 3 years from ages 61 through ages 103 or 13 with a history of 3 consecutive normal Pap tests. Testing can be stopped between 65 and 70 with 3 consecutive normal Pap tests and no abnormal Pap or HPV tests in the past 10 years.   Fecal occult blood test (FOBT) of stool. / Every year beginning at age 47 and continuing until age 77. You may not have to do this test if you get colonoscopy every 10 years.   Flexible sigmoidoscopy** or colonoscopy.** / Every 5 years for a flexible sigmoidoscopy or every 10 years for a colonoscopy beginning at age 59 and continuing until age 32.   Osteoporosis screening.** / A one-time screening for women ages 65 and over and women at risk for fractures or osteoporosis.   Skin self-exam. / Monthly.   Influenza immunization.** / Every year.   Pneumococcal polysaccharide immunization.** / 1 dose at age 99 (or older) if you have never been vaccinated.   Tetanus, diphtheria, pertussis (Tdap, Td) immunization. / A one-time dose of Tdap vaccine if you are over 65 and have contact with an infant, are a Research scientist (physical sciences), or simply want to be protected from whooping cough. After that, you need a Td booster dose every 10 years.   Varicella immunization. **/ Consult your caregiver.   Meningococcal immunization.** / Consult your caregiver.   Hepatitis A immunization. ** / Consult your caregiver. 2 doses, 6 to 18 months apart.    Hepatitis B immunization.** / Check with your caregiver. 3 doses, usually over 6 months.  ** Family history and personal history of risk and conditions may change your caregiver's recommendations. Document Released: 10/09/2001 Document Revised: 04/25/2011 Document Reviewed: 01/08/2011 [facility] Patient Information 2012 [place], [place]. Minimize fatty foods  Start MegaRed Krill oil caps, 1 daily by Schiff  Kegel exercises sets of 10 twice a day Bladder retraining waiting 10 to 15 extra minutes Drop off records and call either [place] or Dr Loreta Ave for further evaluation if epigastric pain worsens. Consider starting a probiotic such as Align caps daily, generic is OK

## 2011-09-28 NOTE — Assessment & Plan Note (Signed)
Maintain exercise regimen and given paperwork on DASH diet

## 2011-09-28 NOTE — Assessment & Plan Note (Signed)
Takes her Omeprazole every other days and in general her symptoms are better. She describes an episode of sudden onset of very severe epigastric pain radiating to her back which occurred last summer in July while in [place]. She said the pain was bad enough to last long enough that she ended up presenting to the emergency room was admitted and was ruled out for any cardiac disease. She had a stress test as well some ultrasounds done. It is unclear whether they did an abdominal ultrasound are not she's not had another episode of pain that severe again but does have several episodes a week of discomfort in her epigastrium. She notes it is more likely after fatty foods than not. Symptoms suggestive of gallbladder disease or consider intermittent symptoms of hiatal hernia. She does have a gastroenterologist, Dr Loreta Ave. She recently got off her records from her hospital stay in [place] date the week and review for abdominal ultrasound. She will call symptoms worsen for possible HIDA scan and or referral back to gastroenterology for further intervention. She is to minimize fatty and spicy foods in the interim.

## 2011-09-28 NOTE — Assessment & Plan Note (Signed)
Was stable over a year. Urinalysis over the summer was unremarkable. She reports having urgency and frequency having to go roughly over half an hour during her waking hours. Of note she can sleep through the night without having to get up at all quite frequently. Denies dysuria or other associated symptoms. Has had no previous workup or medications. She is educated people exercises to perform sets of 10 twice daily. She is also talked about bladder retraining in trying to make herself wait 45 minutes when she has the urge to go and then extend from there reassess at next visit consider medications and referral for possible physical therapy or urologic evaluation

## 2011-09-28 NOTE — Assessment & Plan Note (Signed)
Adequately controlled, encouraged to minimize sodium in the diet and given papers to read on the DASH diet

## 2011-09-30 LAB — URINE CULTURE
Colony Count: NO GROWTH
Organism ID, Bacteria: NO GROWTH

## 2011-10-02 DIAGNOSIS — R1902 Left upper quadrant abdominal swelling, mass and lump: Secondary | ICD-10-CM | POA: Insufficient documentation

## 2011-10-02 NOTE — Progress Notes (Signed)
Patient ID: Christina [name], female   DOB: 1941-01-23, 71 y.o.   MRN: [id number] Christina PANGILINAN [id number] 08-Dec-1940 10/02/2011      Progress Note New Patient  Subjective  Chief Complaint  Chief Complaint  Patient presents with  . Establish Care    new patient    HPI  Patient is a 71 year old Caucasian female who is in today for new patient appointment. Patient has not had any recent acute illness but does acknowledge syncopal urinary frequency over several years. No urgency, incontinence, nocturia. She is frustrated regarding recent weight gain. She was widowed 11 years ago. Her last colonoscopy was over 10 years ago with Dr. Loreta Ave. She had an episode of severe epigastric pain in July 2012 which resulted in a hospital visit when she was in [place], [place]. It has not recurred to that degree since. He patient notes no recent febrile illness chest pain, palpitations, shortness of breath, GI or GU complaints.  Past Medical History  Diagnosis Date  . Vertigo   . Chicken pox as a child  . Measles as a child  . Mumps as a child  . Asthma     a little?  [name] Hypertension   . Urinary frequency 09/28/2011    Past Surgical History  Procedure Date  . Breast biopsy 1960, 70's  . Tubal ligation 1970  . Cystectomy     abdomen    Family History  Problem Relation Age of Onset  . Cancer Mother 35    lung- smoker, ovarian  . Alcohol abuse Father   . Cancer Sister     ovarian  . Diabetes Sister   . Hypertension Sister   . Other Maternal Grandmother     pacemaker  . Multiple sclerosis Maternal Grandfather     ?    History   Social History  . Marital Status: Widowed    Spouse Name: N/A    Number of Children: N/A  . Years of Education: N/A   Occupational History  . Not on file.   Social History Main Topics  . Smoking status: Former Smoker -- 1.0 packs/day for 30 years    Types: Cigarettes    Quit date: 08/27/1990  . Smokeless tobacco: Never Used  . Alcohol Use: No  .  Drug Use: No  . Sexually Active: Not on file   Other Topics Concern  . Not on file   Social History Narrative  . No narrative on file    Current Outpatient Prescriptions on File Prior to Visit  Medication Sig Dispense Refill  . aspirin 81 MG tablet Take 81 mg by mouth daily.        . Calcium Carbonate (CALCIUM 600 PO) Take by mouth.        . Cholecalciferol (VITAMIN D) 1000 UNITS capsule Take 1,000 Units by mouth daily.        . fish oil-omega-3 fatty acids 1000 MG capsule Take 1 g by mouth daily.        [name] losartan (COZAAR) 50 MG tablet Take 50 mg by mouth daily.          Allergies  Allergen Reactions  . Hydrochlorothiazide     REACTION: cough  . Lisinopril     REACTION: cough    Review of Systems  Review of Systems  Constitutional: Negative for fever, chills and malaise/fatigue.  HENT: Negative for hearing loss, nosebleeds and congestion.   Eyes: Negative for discharge.  Respiratory: Negative for cough, sputum production, shortness of  breath and wheezing.   Cardiovascular: Negative for chest pain, palpitations and leg swelling.  Gastrointestinal: Negative for heartburn, nausea, vomiting, abdominal pain, diarrhea, constipation and blood in stool.  Genitourinary: Positive for frequency. Negative for dysuria, urgency and hematuria.  Musculoskeletal: Negative for myalgias, back pain and falls.  Skin: Negative for rash.  Neurological: Negative for dizziness, tremors, sensory change, focal weakness, loss of consciousness, weakness and headaches.  Endo/Heme/Allergies: Negative for polydipsia. Does not bruise/bleed easily.  Psychiatric/Behavioral: Negative for depression and suicidal ideas. The patient is not nervous/anxious and does not have insomnia.     Objective  BP 140/80  Pulse 88  Temp(Src) 98.6 F (37 C) (Temporal)  Ht 5\' 5"  (1.651 m)  Wt 192 lb 12.8 oz (87.454 kg)  BMI 32.08 kg/m2  SpO2 97%  Physical Exam  Physical Exam  Constitutional: She is oriented to  person, place, and time and well-developed, well-nourished, and in no distress. No distress.  HENT:  Head: Normocephalic and atraumatic.  Right Ear: External ear normal.  Left Ear: External ear normal.  Nose: Nose normal.  Mouth/Throat: Oropharynx is clear and moist. No oropharyngeal exudate.  Eyes: Conjunctivae are normal. Pupils are equal, round, and reactive to light. Right eye exhibits no discharge. Left eye exhibits no discharge. No scleral icterus.  Neck: Normal range of motion. Neck supple. No thyromegaly present.  Cardiovascular: Normal rate, regular rhythm, normal heart sounds and intact distal pulses.   No murmur heard. Pulmonary/Chest: Effort normal and breath sounds normal. No respiratory distress. She has no wheezes. She has no rales.  Abdominal: Soft. Bowel sounds are normal. She exhibits no distension and no mass. There is no tenderness.  Musculoskeletal: Normal range of motion. She exhibits no edema and no tenderness.  Lymphadenopathy:    She has no cervical adenopathy.  Neurological: She is alert and oriented to person, place, and time. She has normal reflexes. No cranial nerve deficit. Coordination normal.  Skin: Skin is warm and dry. No rash noted. She is not diaphoretic.  Psychiatric: Mood, memory and affect normal.       Assessment & Plan  Insomnia Uses Alprazolam prn for sleep, is given a refill. It works well most nights but occasionally she has a restless night. She may take a second pill on a rough night infrequently  Asthma No recent episodes or concerns  Essential hypertension, benign Adequately controlled, encouraged to minimize sodium in the diet and given papers to read on the DASH diet  PALPITATIONS, OCCASIONAL Notes a skipped heart beat a couple times each week, always happens in evenings and only lasts a second. She takes a quick breath and it is gone, it is never sustained or assoiciated with diaphoresis, sob, nausea. Likely PVCs patient advised  to notify us if they become longer in duration or other symptoms become associated.  GERD Takes her Omeprazole every other days and in general her symptoms are better. She describes an episode of sudden onset of very severe epigastric pain radiating to her back which occurred last summer in July while in [place]. She said the pain was bad enough to last long enough that she ended up presenting to the emergency room was admitted and was ruled out for any cardiac disease. She had a stress test as well some ultrasounds done. It is unclear whether they did an abdominal ultrasound are not she's not had another episode of pain that severe again but does have several episodes a week of discomfort in her epigastrium. She notes  it is more likely after fatty foods than not. Symptoms suggestive of gallbladder disease or consider intermittent symptoms of hiatal hernia. She does have a gastroenterologist, Dr Loreta Ave. She recently got off her records from her hospital stay in [place] date the week and review for abdominal ultrasound. She will call symptoms worsen for possible HIDA scan and or referral back to gastroenterology for further intervention. She is to minimize fatty and spicy foods in the interim.  OBESITY Maintain exercise regimen and given paperwork on DASH diet  Urinary frequency Was stable over a year. Urinalysis over the summer was unremarkable. She reports having urgency and frequency having to go roughly over half an hour during her waking hours. Of note she can sleep through the night without having to get up at all quite frequently. Denies dysuria or other associated symptoms. Has had no previous workup or medications. She is educated people exercises to perform sets of 10 twice daily. She is also talked about bladder retraining in trying to make herself wait 45 minutes when she has the urge to go and then extend from there reassess at next visit consider medications and referral for possible  physical therapy or urologic evaluation

## 2011-10-30 ENCOUNTER — Other Ambulatory Visit: Payer: Self-pay | Admitting: Family Medicine

## 2011-11-18 ENCOUNTER — Other Ambulatory Visit: Payer: Self-pay | Admitting: Family Medicine

## 2011-11-26 ENCOUNTER — Ambulatory Visit: Payer: Medicare Other | Admitting: Family Medicine

## 2011-12-11 ENCOUNTER — Telehealth: Payer: Self-pay | Admitting: Family Medicine

## 2011-12-11 ENCOUNTER — Other Ambulatory Visit: Payer: Self-pay | Admitting: Family Medicine

## 2011-12-11 DIAGNOSIS — R42 Dizziness and giddiness: Secondary | ICD-10-CM

## 2011-12-11 MED ORDER — MECLIZINE HCL 25 MG PO TABS: 25.0000 mg | ORAL_TABLET | Freq: Three times a day (TID) | ORAL | Status: AC | PRN

## 2011-12-11 NOTE — Telephone Encounter (Signed)
Patient walked in today c/o a history of vertigo/dizziness which was last symptomatic in 2005. Symptoms have jsut recurred and she offers no other concerns. She is leaving town in next 24 hours and requesting a refill on some Meclizine. She brings in the bottle which was from 2005. She is allowed a small amount and she is asked to come in for an appt when she gets back to town and to seek immediate medical care if her symptoms worsen while she is gone

## 2012-01-09 ENCOUNTER — Other Ambulatory Visit: Payer: Self-pay

## 2012-01-09 DIAGNOSIS — G4700 Insomnia, unspecified: Secondary | ICD-10-CM

## 2012-01-09 MED ORDER — ALPRAZOLAM 0.5 MG PO TABS: 0.5000 mg | ORAL_TABLET | Freq: Two times a day (BID) | ORAL | Status: DC

## 2012-01-09 NOTE — Telephone Encounter (Signed)
OK to refill Alprazolam once with same sig, # 60, no further refills til seen

## 2012-01-09 NOTE — Telephone Encounter (Signed)
RX refilled  

## 2012-01-09 NOTE — Telephone Encounter (Signed)
We received a request for [facility] for [facility] fax # [phone]. Last RX wrote on 09-28-11 quantity 60 with 2 refills. Please advise?

## 2012-01-30 ENCOUNTER — Ambulatory Visit: Payer: Medicare Other | Admitting: Family Medicine

## 2012-01-31 ENCOUNTER — Other Ambulatory Visit (HOSPITAL_COMMUNITY)
Admission: RE | Admit: 2012-01-31 | Discharge: 2012-01-31 | Disposition: A | Discharge status: Home / Self Care | Payer: Medicare Other | Source: Ambulatory Visit | Attending: Family Medicine | Admitting: Family Medicine

## 2012-01-31 ENCOUNTER — Ambulatory Visit (INDEPENDENT_AMBULATORY_CARE_PROVIDER_SITE_OTHER): Payer: Medicare Other | Admitting: Family Medicine

## 2012-01-31 ENCOUNTER — Encounter: Payer: Self-pay | Admitting: Family Medicine

## 2012-01-31 VITALS — BP 134/81 | HR 72 | Temp 97.8°F | Ht 65.0 in | Wt 182.8 lb

## 2012-01-31 DIAGNOSIS — Z124 Encounter for screening for malignant neoplasm of cervix: Secondary | ICD-10-CM

## 2012-01-31 DIAGNOSIS — E669 Obesity, unspecified: Secondary | ICD-10-CM

## 2012-01-31 DIAGNOSIS — R1310 Dysphagia, unspecified: Secondary | ICD-10-CM

## 2012-01-31 DIAGNOSIS — F329 Major depressive disorder, single episode, unspecified: Secondary | ICD-10-CM

## 2012-01-31 DIAGNOSIS — I10 Essential (primary) hypertension: Secondary | ICD-10-CM

## 2012-01-31 DIAGNOSIS — Z1211 Encounter for screening for malignant neoplasm of colon: Secondary | ICD-10-CM

## 2012-01-31 DIAGNOSIS — Z0000 Encounter for general adult medical examination without abnormal findings: Secondary | ICD-10-CM

## 2012-01-31 DIAGNOSIS — R002 Palpitations: Secondary | ICD-10-CM

## 2012-01-31 HISTORY — DX: Encounter for screening for malignant neoplasm of cervix: Z12.4

## 2012-01-31 LAB — HEPATIC FUNCTION PANEL
ALT: 14 U/L (ref 0–35)
AST: 25 U/L (ref 0–37)
Albumin: 4.3 g/dL (ref 3.5–5.2)
Alkaline Phosphatase: 60 U/L (ref 39–117)
Bilirubin, Direct: 0 mg/dL (ref 0.0–0.3)
Total Bilirubin: 1.2 mg/dL (ref 0.3–1.2)
Total Protein: 6.9 g/dL (ref 6.0–8.3)

## 2012-01-31 LAB — CBC
HCT: 43.7 % (ref 36.0–46.0)
Hemoglobin: 14.4 g/dL (ref 12.0–15.0)
MCHC: 33 g/dL (ref 30.0–36.0)
MCV: 97 fl (ref 78.0–100.0)
Platelets: 219 10*3/uL (ref 150.0–400.0)
RBC: 4.5 Mil/uL (ref 3.87–5.11)
RDW: 14.3 % (ref 11.5–14.6)
WBC: 6 10*3/uL (ref 4.5–10.5)

## 2012-01-31 LAB — LIPID PANEL
Cholesterol: 165 mg/dL (ref 0–200)
HDL: 63.2 mg/dL (ref >39.00)
LDL Cholesterol: 87 mg/dL (ref 0–99)
Total CHOL/HDL Ratio: 3
Triglycerides: 72 mg/dL (ref 0.0–149.0)
VLDL: 14.4 mg/dL (ref 0.0–40.0)

## 2012-01-31 LAB — RENAL FUNCTION PANEL
Albumin: 4.3 g/dL (ref 3.5–5.2)
BUN: 19 mg/dL (ref 6–23)
CO2: 29 mEq/L (ref 19–32)
Calcium: 10 mg/dL (ref 8.4–10.5)
Chloride: 106 mEq/L (ref 96–112)
Creatinine, Ser: 0.8 mg/dL (ref 0.4–1.2)
GFR: 77.58 mL/min (ref >60.00)
Glucose, Bld: 90 mg/dL (ref 70–99)
Phosphorus: 2.8 mg/dL (ref 2.3–4.6)
Potassium: 4.2 mEq/L (ref 3.5–5.1)
Sodium: 142 mEq/L (ref 135–145)

## 2012-01-31 LAB — TSH: TSH: 1.15 u[IU]/mL (ref 0.35–5.50)

## 2012-01-31 NOTE — Assessment & Plan Note (Signed)
Good weight loss since last visit. 

## 2012-01-31 NOTE — Assessment & Plan Note (Signed)
Agrees to referral back for screening colonoscopy

## 2012-01-31 NOTE — Assessment & Plan Note (Signed)
Rare, fleeting and no associated symptoms. She will notify us if they increase in frequency or intensity

## 2012-01-31 NOTE — Progress Notes (Signed)
Patient ID: Christina [name], female   DOB: 1941/02/04, 71 y.o.   MRN: [id number] KHALIYAH NORTHROP [id number] 01/09/1941 01/31/2012      Progress Note-Follow Up  Subjective  Chief Complaint  Chief Complaint  Patient presents with  . Gynecologic Exam    pap  . Annual Exam    physical    HPI  Patient is a 71 year old Caucasian female in today for annual GYN exam. Overall she says she feels well today. She's recently had good health. She's had no fevers, chills, chest pain, palpitations, shortness of breath, GI or GU complaints no breast lumps bumps or concerns although she does have a history of fibrocystic breast disease. Denies any vaginal discharge or concerns. She is noting waking up at times feeling as if her throat is clogged and she has trouble swallowing. She sits up and she feels much better. She does have ongoing heartburn. She is agreeing to referral for screening colonoscopy after declining at her last visit.  Past Medical History  Diagnosis Date  . Vertigo   . Chicken pox as a child  . Measles as a child  . Mumps as a child  . Asthma     a little?  [name] Hypertension   . Urinary frequency 09/28/2011  . Cervical cancer screening 01/31/2012  . Dysphagia 01/31/2012  . Routine health maintenance 01/31/2012    Past Surgical History  Procedure Date  . Breast biopsy 1960, 70's  . Tubal ligation 1970  . Cystectomy     abdomen    Family History  Problem Relation Age of Onset  . Cancer Mother 5    lung- smoker, ovarian  . Alcohol abuse Father   . Cancer Sister     ovarian  . Diabetes Sister   . Hypertension Sister   . Other Maternal Grandmother     pacemaker  . Multiple sclerosis Maternal Grandfather     ?    History   Social History  . Marital Status: Widowed    Spouse Name: N/A    Number of Children: N/A  . Years of Education: N/A   Occupational History  . Not on file.   Social History Main Topics  . Smoking status: Former Smoker -- 1.0 packs/day for 30  years    Types: Cigarettes    Quit date: 08/27/1990  . Smokeless tobacco: Never Used  . Alcohol Use: No  . Drug Use: No  . Sexually Active: Not on file   Other Topics Concern  . Not on file   Social History Narrative  . No narrative on file    Current Outpatient Prescriptions on File Prior to Visit  Medication Sig Dispense Refill  . ALPRAZolam (XANAX) 0.5 MG tablet Take 1 tablet (0.5 mg total) by mouth 2 (two) times daily.  60 tablet  0  . aspirin 81 MG tablet Take 81 mg by mouth daily.        . Calcium Carbonate (CALCIUM 600 PO) Take by mouth.        . Cholecalciferol (VITAMIN D) 1000 UNITS capsule Take 1,000 Units by mouth daily.        . fish oil-omega-3 fatty acids 1000 MG capsule Take 1 g by mouth daily.        [name] losartan (COZAAR) 50 MG tablet TAKE 1 TABLET BY MOUTH EVERY DAY  90 tablet  3  . omeprazole (PRILOSEC) 40 MG capsule Take 1 capsule (40 mg total) by mouth every other day as needed (reflux).  30 capsule  1  . omeprazole (PRILOSEC) 40 MG capsule TAKE ONE CAPSULE BY MOUTH EVERY DAY  30 capsule  1    Allergies  Allergen Reactions  . Hydrochlorothiazide     REACTION: cough  . Lisinopril     REACTION: cough    Review of Systems  Review of Systems  Constitutional: Negative for fever, chills and malaise/fatigue.  HENT: Negative for hearing loss, nosebleeds and congestion.   Eyes: Negative for discharge.  Respiratory: Negative for cough, sputum production, shortness of breath and wheezing.   Cardiovascular: Negative for chest pain, palpitations and leg swelling.  Gastrointestinal: Positive for heartburn. Negative for nausea, vomiting, abdominal pain, diarrhea, constipation and blood in stool.  Genitourinary: Negative for dysuria, urgency, frequency and hematuria.  Musculoskeletal: Negative for myalgias, back pain and falls.  Skin: Negative for rash.  Neurological: Negative for dizziness, tremors, sensory change, focal weakness, loss of consciousness, weakness and  headaches.  Endo/Heme/Allergies: Negative for polydipsia. Does not bruise/bleed easily.  Psychiatric/Behavioral: Negative for depression and suicidal ideas. The patient is not nervous/anxious and does not have insomnia.     Objective  BP 134/81  Pulse 72  Temp(Src) 97.8 F (36.6 C) (Temporal)  Ht 5\' 5"  (1.651 m)  Wt 182 lb 12.8 oz (82.918 kg)  BMI 30.42 kg/m2  SpO2 95%  Physical Exam  Physical Exam  Constitutional: She is oriented to person, place, and time and well-developed, well-nourished, and in no distress. No distress.  HENT:  Head: Normocephalic and atraumatic.  Right Ear: External ear normal.  Left Ear: External ear normal.  Nose: Nose normal.  Mouth/Throat: Oropharynx is clear and moist. No oropharyngeal exudate.  Eyes: Conjunctivae are normal. Pupils are equal, round, and reactive to light. Right eye exhibits no discharge. Left eye exhibits no discharge. No scleral icterus.  Neck: Normal range of motion. Neck supple. No thyromegaly present.  Cardiovascular: Normal rate, regular rhythm, normal heart sounds and intact distal pulses.   No murmur heard. Pulmonary/Chest: Effort normal and breath sounds normal. No respiratory distress. She has no wheezes. She has no rales.  Abdominal: Soft. Bowel sounds are normal. She exhibits no distension and no mass. There is no tenderness.  Genitourinary: Vagina normal, uterus normal, cervix normal, right adnexa normal and left adnexa normal. No vaginal discharge found.  Musculoskeletal: Normal range of motion. She exhibits no edema and no tenderness.  Lymphadenopathy:    She has no cervical adenopathy.  Neurological: She is alert and oriented to person, place, and time. She has normal reflexes. No cranial nerve deficit. Coordination normal.  Skin: Skin is warm and dry. No rash noted. She is not diaphoretic.  Psychiatric: Mood, memory and affect normal.    Lab Results  Component Value Date   TSH 2.806 06/07/2009   Lab Results    Component Value Date   WBC 5.1 02/24/2008   HGB 14.2 02/24/2008   HCT 44.2 02/24/2008   MCV 94.2 02/24/2008   PLT 241 02/24/2008   Lab Results  Component Value Date   CREATININE 0.84 01/03/2011   BUN 10 01/03/2011   NA 142 01/03/2011   K 4.0 01/03/2011   CL 107 01/03/2011   CO2 26 01/03/2011   Lab Results  Component Value Date   ALT 15 01/03/2011   AST 19 01/03/2011   ALKPHOS 70 01/03/2011   BILITOT 0.8 01/03/2011   Lab Results  Component Value Date   CHOL 198 01/03/2011   Lab Results  Component Value Date   HDL  60 01/03/2011   Lab Results  Component Value Date   LDLCALC 122* 01/03/2011   Lab Results  Component Value Date   TRIG 81 01/03/2011   Lab Results  Component Value Date   CHOLHDL 3.3 01/03/2011     Assessment & Plan  Cervical cancer screening Pap done today no gyn concerns identified today. MGM due in August  PALPITATIONS, OCCASIONAL Rare, fleeting and no associated symptoms. She will notify us if they increase in frequency or intensity  DEPRESSION Well controlled today. Feeling good  Dysphagia Describes waking up at night feeling like she cannot swallow like her throat is closing. She has a long standing h/o reflux and denies ever having had an upper endoscopy. She is also agreeing to a repeat colonoscopy for screening purposes so we will refer her back to her Gastroenterologist, Dr Loreta Ave for further evaluation and treatment  OBESITY Good weight loss since last visit  Routine health maintenance Agrees to referral back for screening colonoscopy

## 2012-01-31 NOTE — Assessment & Plan Note (Addendum)
Pap done today no gyn concerns identified today. MGM due in August

## 2012-01-31 NOTE — Assessment & Plan Note (Signed)
Well controlled today. Feeling good

## 2012-01-31 NOTE — Assessment & Plan Note (Signed)
Describes waking up at night feeling like she cannot swallow like her throat is closing. She has a long standing h/o reflux and denies ever having had an upper endoscopy. She is also agreeing to a repeat colonoscopy for screening purposes so we will refer her back to her Gastroenterologist, Dr Loreta Ave for further evaluation and treatment

## 2012-01-31 NOTE — Patient Instructions (Signed)
Dysphagia  Swallowing problems (dysphagia) occur when solids and liquids seem to stick in your throat on the way down to your stomach, or the food takes longer to get to the stomach. Other symptoms (problems) include regurgitating (burping) up food, noises coming from the throat, chest discomfort with swallowing, and a feeling of fullness in the throat when swallowing. When blockage in the throat is complete it may be associated with drooling.  CAUSES  There are many causes of swallowing difficulties and the following is generalized information regarding a number of reasons for this problem. Problems with swallowing may occur because of problems with the muscles. The food cannot be propelled in the usual manner into the stomach. There may be ulcers, scar tissueor inflammation (soreness) in the esophagus (the food tube from the mouth to the stomach) which blocks food from passing normally into the stomach. Causes of inflammation include acid reflux from the stomach into the esophagus. Inflammation can also be caused by the herpes simplex virus, Candida (yeast), radiation (as with treatment of cancer), or inflammation from medications not taken with adequate fluids to wash them down into the stomach. There may be nerve problems so signals cannot be sent adequately telling the muscles of the esophagus to contract and move the food along. Achalasia is a rare disorder of the esophagus in which muscular contractions of the esophagus are uncoordinated. Globus hystericus is a relatively common problem in young females in which there is a sense of an obstruction or difficulty in swallowing, but in which no abnormalities can be found. This problem usually improves over time with reassurance and testing to rule out other causes.  DIAGNOSIS  A number of tests will help your caregiver know what is the cause of your swallowing problems. These tests may include a barium swallow in which x-rays are taken while you are drinking a  liquid that outlines the lining of the esophagus on x-ray. If the stomach and small bowel are also studied in this manner it is called an upper gastrointestinal exam (UGI). Endoscopy may be done in which your caregiver examines your throat, esophagus, stomach and small bowel with an instrument like a small flexible telescope. Motility studies which measure the effectiveness and coordination of the muscular contractions of the esophagus may also be done.  TREATMENT  The treatment of swallowing problems are many, varying from medications to surgical treatment. The treatment varies with the type of problem found. Your caregiver will discuss your results and treatment with you. If swallowing problems are severe the long term problems which may occur include: malnutrition, pneumonia (from food going into the breathing tubes called trachea and bronchi), and an increase in tumors (lumps) of the esophagus.  SEEK IMMEDIATE MEDICAL CARE IF:   Food or other object becomes lodged in your throat or esophagus and won't move.  Document Released: 08/10/2000 Document Revised: 08/02/2011 Document Reviewed: 03/31/2008  [employer] Patient Information 2012 [employer]

## 2012-02-18 ENCOUNTER — Other Ambulatory Visit: Payer: Self-pay

## 2012-02-18 DIAGNOSIS — G4700 Insomnia, unspecified: Secondary | ICD-10-CM

## 2012-02-18 MED ORDER — ALPRAZOLAM 0.5 MG PO TABS: 0.5000 mg | ORAL_TABLET | Freq: Two times a day (BID) | ORAL | Status: DC

## 2012-02-18 NOTE — Telephone Encounter (Signed)
Faxed to pharmacy

## 2012-02-18 NOTE — Telephone Encounter (Signed)
Please advise refill? Last RX wrote on 12-30-11 quantity 60 with 0 refills. Send RX to [phone]

## 2012-03-24 ENCOUNTER — Other Ambulatory Visit: Payer: Self-pay | Admitting: *Deleted

## 2012-03-24 DIAGNOSIS — G4700 Insomnia, unspecified: Secondary | ICD-10-CM

## 2012-03-24 MED ORDER — ALPRAZOLAM 0.5 MG PO TABS: 0.5000 mg | ORAL_TABLET | Freq: Two times a day (BID) | ORAL | Status: DC

## 2012-03-24 NOTE — Telephone Encounter (Signed)
[facility]

## 2012-03-24 NOTE — Telephone Encounter (Signed)
Faxed refill request received from pharmacy ([facility]) for ALPRAZOLAM Last filled by MD on 02/18/12, #60 X 0 Last seen on 01/31/12 Follow up 08/01/12 PLEASE ADVISE

## 2012-04-06 ENCOUNTER — Other Ambulatory Visit: Payer: Self-pay | Admitting: Family Medicine

## 2012-04-25 ENCOUNTER — Other Ambulatory Visit: Payer: Self-pay | Admitting: *Deleted

## 2012-04-25 DIAGNOSIS — G4700 Insomnia, unspecified: Secondary | ICD-10-CM

## 2012-04-25 MED ORDER — ALPRAZOLAM 0.5 MG PO TABS: 0.5000 mg | ORAL_TABLET | Freq: Two times a day (BID) | ORAL | Status: DC

## 2012-04-25 NOTE — Telephone Encounter (Signed)
Faxed refill request received from pharmacy for ALPRAZOLAM Last filled by MD on 03/24/12, #60 X 0 Last seen on 01/31/12 Follow up 6 MONTHS, 08/01/12 Please advise refills.

## 2012-04-26 NOTE — Telephone Encounter (Signed)
[facility] 

## 2012-05-21 ENCOUNTER — Other Ambulatory Visit: Payer: Self-pay | Admitting: Family Medicine

## 2012-05-21 DIAGNOSIS — Z1231 Encounter for screening mammogram for malignant neoplasm of breast: Secondary | ICD-10-CM

## 2012-06-09 ENCOUNTER — Other Ambulatory Visit: Payer: Self-pay

## 2012-06-09 DIAGNOSIS — G4700 Insomnia, unspecified: Secondary | ICD-10-CM

## 2012-06-10 MED ORDER — ALPRAZOLAM 0.5 MG PO TABS: 0.5000 mg | ORAL_TABLET | Freq: Two times a day (BID) | ORAL | Status: DC

## 2012-06-10 NOTE — Telephone Encounter (Signed)
Faxed to pharmacy

## 2012-06-10 NOTE — Telephone Encounter (Signed)
Please advise Alprazolam refill? Last RX wrote on 04-25-12 with quantity 60 and 0 refills.  If ok fax to [phone]

## 2012-06-24 ENCOUNTER — Ambulatory Visit (INDEPENDENT_AMBULATORY_CARE_PROVIDER_SITE_OTHER): Payer: Medicare Other

## 2012-06-24 ENCOUNTER — Encounter: Payer: Self-pay | Admitting: Family Medicine

## 2012-06-24 DIAGNOSIS — Z1231 Encounter for screening mammogram for malignant neoplasm of breast: Secondary | ICD-10-CM

## 2012-06-25 ENCOUNTER — Telehealth: Payer: Self-pay

## 2012-06-25 NOTE — Telephone Encounter (Signed)
Message copied by Court Joy on Wed Jun 25, 2012 10:09 AM ------      Message from: Christina [name]      Created: Wed Jun 25, 2012  8:49 AM       Please update her colonoscopy in her chart, she just sent me [name] mychart she had it done on 02/14/2005, due again in 2016 THX

## 2012-07-10 ENCOUNTER — Ambulatory Visit (INDEPENDENT_AMBULATORY_CARE_PROVIDER_SITE_OTHER): Payer: Medicare Other | Admitting: Family Medicine

## 2012-07-10 ENCOUNTER — Encounter: Payer: Self-pay | Admitting: Family Medicine

## 2012-07-10 VITALS — BP 139/77 | HR 72 | Temp 98.9°F | Ht 65.0 in | Wt 184.0 lb

## 2012-07-10 DIAGNOSIS — L309 Dermatitis, unspecified: Secondary | ICD-10-CM

## 2012-07-10 DIAGNOSIS — L259 Unspecified contact dermatitis, unspecified cause: Secondary | ICD-10-CM

## 2012-07-10 DIAGNOSIS — K449 Diaphragmatic hernia without obstruction or gangrene: Secondary | ICD-10-CM

## 2012-07-10 DIAGNOSIS — R1310 Dysphagia, unspecified: Secondary | ICD-10-CM

## 2012-07-10 DIAGNOSIS — J329 Chronic sinusitis, unspecified: Secondary | ICD-10-CM

## 2012-07-10 DIAGNOSIS — G4700 Insomnia, unspecified: Secondary | ICD-10-CM

## 2012-07-10 DIAGNOSIS — I10 Essential (primary) hypertension: Secondary | ICD-10-CM

## 2012-07-10 DIAGNOSIS — J019 Acute sinusitis, unspecified: Secondary | ICD-10-CM | POA: Insufficient documentation

## 2012-07-10 DIAGNOSIS — K219 Gastro-esophageal reflux disease without esophagitis: Secondary | ICD-10-CM

## 2012-07-10 MED ORDER — AMOXICILLIN 500 MG PO CAPS: 500.0000 mg | ORAL_CAPSULE | Freq: Three times a day (TID) | ORAL | Status: DC

## 2012-07-10 MED ORDER — ALPRAZOLAM 0.5 MG PO TABS: 0.5000 mg | ORAL_TABLET | Freq: Two times a day (BID) | ORAL | Status: DC

## 2012-07-10 MED ORDER — GUAIFENESIN ER 600 MG PO TB12: 1200.0000 mg | ORAL_TABLET | Freq: Two times a day (BID) | ORAL | Status: DC

## 2012-07-10 MED ORDER — HYDROCOD POLST-CHLORPHEN POLST 10-8 MG/5ML PO LQCR: 5.0000 mL | Freq: Every evening | ORAL | Status: DC | PRN

## 2012-07-10 MED ORDER — TRIAMCINOLONE ACETONIDE 0.1 % EX CREA: TOPICAL_CREAM | Freq: Two times a day (BID) | CUTANEOUS | Status: DC

## 2012-07-10 NOTE — Assessment & Plan Note (Signed)
Recent EGD with Dr Loreta Ave confirmed [facility] and irritation, possible ulcer. Encouraged to take her Omeprazole daily since she still struggles with dysphagia

## 2012-07-10 NOTE — Assessment & Plan Note (Signed)
Started on Mucinex, Tussionex, antibiotics, push clear fluids and increase rest

## 2012-07-10 NOTE — Assessment & Plan Note (Signed)
Avoid offending foods, small, frequent meals. Take Omeprazole daily

## 2012-07-10 NOTE — Patient Instructions (Addendum)
Digestive Health probiotics by [employer]  Sinusitis Sinusitis is redness, soreness, and swelling (inflammation) of the paranasal sinuses. Paranasal sinuses are air pockets within the bones of your face (beneath the eyes, the middle of the forehead, or above the eyes). In healthy paranasal sinuses, mucus is able to drain out, and air is able to circulate through them by way of your nose. However, when your paranasal sinuses are inflamed, mucus and air can become trapped. This can allow bacteria and other germs to grow and cause infection. Sinusitis can develop quickly and last only a short time (acute) or continue over a long period (chronic). Sinusitis that lasts for more than 12 weeks is considered chronic.  CAUSES  Causes of sinusitis include:  Allergies.  Structural abnormalities, such as displacement of the cartilage that separates your nostrils (deviated septum), which can decrease the air flow through your nose and sinuses and affect sinus drainage.  Functional abnormalities, such as when the small hairs (cilia) that line your sinuses and help remove mucus do not work properly or are not present. SYMPTOMS  Symptoms of acute and chronic sinusitis are the same. The primary symptoms are pain and pressure around the affected sinuses. Other symptoms include:  Upper toothache.  Earache.  Headache.  Bad breath.  Decreased sense of smell and taste.  A cough, which worsens when you are lying flat.  Fatigue.  Fever.  Thick drainage from your nose, which often is green and may contain pus (purulent).  Swelling and warmth over the affected sinuses. DIAGNOSIS  Your caregiver will perform a physical exam. During the exam, your caregiver may:  Look in your nose for signs of abnormal growths in your nostrils (nasal polyps).  Tap over the affected sinus to check for signs of infection.  View the inside of your sinuses (endoscopy) with a special imaging device with a light attached  (endoscope), which is inserted into your sinuses. If your caregiver suspects that you have chronic sinusitis, one or more of the following tests may be recommended:  Allergy tests.  Nasal culture A sample of mucus is taken from your nose and sent to a lab and screened for bacteria.  Nasal cytology A sample of mucus is taken from your nose and examined by your caregiver to determine if your sinusitis is related to an allergy. TREATMENT  Most cases of acute sinusitis are related to a viral infection and will resolve on their own within 10 days. Sometimes medicines are prescribed to help relieve symptoms (pain medicine, decongestants, nasal steroid sprays, or saline sprays).  However, for sinusitis related to a bacterial infection, your caregiver will prescribe antibiotic medicines. These are medicines that will help kill the bacteria causing the infection.  Rarely, sinusitis is caused by a fungal infection. In theses cases, your caregiver will prescribe antifungal medicine. For some cases of chronic sinusitis, surgery is needed. Generally, these are cases in which sinusitis recurs more than 3 times per year, despite other treatments. HOME CARE INSTRUCTIONS   Drink plenty of water. Water helps thin the mucus so your sinuses can drain more easily.  Use a humidifier.  Inhale steam 3 to 4 times a day (for example, sit in the bathroom with the shower running).  Apply a warm, moist washcloth to your face 3 to 4 times a day, or as directed by your caregiver.  Use saline nasal sprays to help moisten and clean your sinuses.  Take over-the-counter or prescription medicines for pain, discomfort, or fever only as directed  by your caregiver. SEEK IMMEDIATE MEDICAL CARE IF:  You have increasing pain or severe headaches.  You have nausea, vomiting, or drowsiness.  You have swelling around your face.  You have vision problems.  You have a stiff neck.  You have difficulty breathing. MAKE SURE  YOU:   Understand these instructions.  Will watch your condition.  Will get help right away if you are not doing well or get worse. Document Released: 08/13/2005 Document Revised: 11/05/2011 Document Reviewed: 08/28/2011 [facility] Patient Information 2013 [place], [place].

## 2012-07-10 NOTE — Assessment & Plan Note (Signed)
Adequately controlled, avoid DM and D products.

## 2012-07-10 NOTE — Progress Notes (Signed)
Patient ID: Christina [name], female   DOB: [birth year], 71 y.o.   MRN: [id number] Christina [name] [id number] [birth year] 07/10/2012      Progress Note-Follow Up  Subjective  Chief Complaint  Chief Complaint  Patient presents with  . Cough    w/ green mucus X  days  . Nasal Congestion    X 6 days    HPI  Patient is a 71 year old Caucasian female who is in today with a 70 history of worsening cough. She reports initially started with some congestion and a mild cough seemed to be improving but then worsened again. She has chills and malaise. She notes nasal congestion is now productive of green phlegm. She technologist some anorexia and myalgias. She's had some shortness of breath and wheezing especially when lying down. No chest pain or palpitations. No headache, fevers, myalgias or sore throat. No complaints of ear pain or hearing changes. She is recently been seen by her gastroenterologist Dr. Loreta Ave and upper endoscopy revealed persistent reflux patient reports ulcer and hiatal hernia were also discovered. She continues to struggle with dysphagia and has not increased her Tapazole daily as instructed. Otherwise she reports she's been feeling well and offers no acute complaints.  Past Medical History  Diagnosis Date  . Vertigo   . Chicken pox as a child  . Measles as a child  . Mumps as a child  . Asthma     a little?  [name] Hypertension   . Urinary frequency 09/28/2011  . Cervical cancer screening 01/31/2012  . Dysphagia 01/31/2012  . Routine health maintenance 01/31/2012  . Sinusitis, acute 07/10/2012  . Hiatal hernia with gastroesophageal reflux 10/26/2010    Qualifier: Diagnosis of  By: Linford Arnold MD, Santina Evans      Past Surgical History  Procedure Date  . Breast biopsy 1960, 70's  . Tubal ligation 1970  . Cystectomy     abdomen    Family History  Problem Relation Age of Onset  . Cancer Mother 61    lung- smoker, ovarian  . Alcohol abuse Father   . Cancer Sister     ovarian    . Diabetes Sister   . Hypertension Sister   . Other Maternal Grandmother     pacemaker  . Multiple sclerosis Maternal Grandfather     ?    History   Social History  . Marital Status: Widowed    Spouse Name: N/A    Number of Children: N/A  . Years of Education: N/A   Occupational History  . Not on file.   Social History Main Topics  . Smoking status: Former Smoker -- 1.0 packs/day for 30 years    Types: Cigarettes    Quit date: 08/27/1990  . Smokeless tobacco: Never Used  . Alcohol Use: No  . Drug Use: No  . Sexually Active: Not on file   Other Topics Concern  . Not on file   Social History Narrative  . No narrative on file    Current Outpatient Prescriptions on File Prior to Visit  Medication Sig Dispense Refill  . Calcium Carbonate (CALCIUM 600 PO) Take by mouth.        . Cholecalciferol (VITAMIN D) 1000 UNITS capsule Take 1,000 Units by mouth daily.        . fish oil-omega-3 fatty acids 1000 MG capsule Take 1 g by mouth daily.        [name] losartan (COZAAR) 50 MG tablet TAKE 1 TABLET BY MOUTH EVERY DAY  90 tablet  3  . omeprazole (PRILOSEC) 40 MG capsule Take 1 capsule (40 mg total) by mouth every other day as needed (reflux).  30 capsule  1  . meclizine (ANTIVERT) 25 MG tablet       . [DISCONTINUED] omeprazole (PRILOSEC) 40 MG capsule TAKE ONE CAPSULE BY MOUTH EVERY DAY  30 capsule  1    Allergies  Allergen Reactions  . Hydrochlorothiazide     REACTION: cough  . Lisinopril     REACTION: cough    Review of Systems  Review of Systems  Constitutional: Positive for malaise/fatigue. Negative for fever.  HENT: Positive for congestion. Negative for neck pain.   Eyes: Negative for discharge.  Respiratory: Positive for cough, sputum production and shortness of breath.   Cardiovascular: Negative for chest pain, palpitations and leg swelling.  Gastrointestinal: Positive for heartburn. Negative for nausea, abdominal pain and diarrhea.  Genitourinary: Negative for  dysuria.  Musculoskeletal: Negative for myalgias, back pain and falls.  Skin: Negative for rash.  Neurological: Positive for headaches. Negative for loss of consciousness.  Endo/Heme/Allergies: Negative for polydipsia.  Psychiatric/Behavioral: Negative for depression and suicidal ideas. The patient is not nervous/anxious and does not have insomnia.     Objective  BP 139/77  Pulse 72  Temp 98.9 F (37.2 C) (Temporal)  Ht 5\' 5"  (1.651 m)  Wt 184 lb (83.462 kg)  BMI 30.62 kg/m2  SpO2 96%  Physical Exam  Physical Exam  Constitutional: She is oriented to person, place, and time and well-developed, well-nourished, and in no distress. No distress.  HENT:  Head: Normocephalic and atraumatic.       Nasal mucosa and oropharynx erythematous  Eyes: Conjunctivae normal and EOM are normal. Pupils are equal, round, and reactive to light. Right eye exhibits no discharge. Left eye exhibits no discharge.  Neck: Neck supple. No thyromegaly present.  Cardiovascular: Normal rate, regular rhythm and normal heart sounds.  Exam reveals no gallop.   No murmur heard. Pulmonary/Chest: Effort normal and breath sounds normal. She has no wheezes.  Abdominal: She exhibits no distension and no mass.  Musculoskeletal: She exhibits no edema.  Lymphadenopathy:    She has cervical adenopathy.  Neurological: She is alert and oriented to person, place, and time.  Skin: Skin is warm and dry. No rash noted. She is not diaphoretic.  Psychiatric: Memory, affect and judgment normal.    Lab Results  Component Value Date   TSH 1.15 01/31/2012   Lab Results  Component Value Date   WBC 6.0 01/31/2012   HGB 14.4 01/31/2012   HCT 43.7 01/31/2012   MCV 97.0 01/31/2012   PLT 219.0 01/31/2012   Lab Results  Component Value Date   CREATININE 0.8 01/31/2012   BUN 19 01/31/2012   NA 142 01/31/2012   K 4.2 01/31/2012   CL 106 01/31/2012   CO2 29 01/31/2012   Lab Results  Component Value Date   ALT 14 01/31/2012   AST 25 01/31/2012    ALKPHOS 60 01/31/2012   BILITOT 1.2 01/31/2012   Lab Results  Component Value Date   CHOL 165 01/31/2012   Lab Results  Component Value Date   HDL 63.20 01/31/2012   Lab Results  Component Value Date   LDLCALC 87 01/31/2012   Lab Results  Component Value Date   TRIG 72.0 01/31/2012   Lab Results  Component Value Date   CHOLHDL 3 01/31/2012     Assessment & Plan  Essential hypertension, benign Adequately controlled,  avoid DM and D products.  Sinusitis, acute Started on Mucinex, Tussionex, antibiotics, push clear fluids and increase rest  Dysphagia Recent EGD with Dr Loreta Ave confirmed [facility] and irritation, possible ulcer. Encouraged to take her Omeprazole daily since she still struggles with dysphagia  Hiatal hernia with gastroesophageal reflux Avoid offending foods, small, frequent meals. Take Omeprazole daily  Dermatitis of external ear Given Triamcinolone to use prn, not flared at the moment but patient reports she has had trouble off and on for years and steroids have worked well in past

## 2012-07-11 ENCOUNTER — Encounter: Payer: Self-pay | Admitting: Family Medicine

## 2012-07-11 DIAGNOSIS — L309 Dermatitis, unspecified: Secondary | ICD-10-CM

## 2012-07-11 HISTORY — DX: Dermatitis, unspecified: L30.9

## 2012-07-11 NOTE — Assessment & Plan Note (Signed)
Given Triamcinolone to use prn, not flared at the moment but patient reports she has had trouble off and on for years and steroids have worked well in past

## 2012-07-17 ENCOUNTER — Telehealth: Payer: Self-pay | Admitting: Family Medicine

## 2012-07-17 MED ORDER — BENZONATATE 200 MG PO CAPS: 200.0000 mg | ORAL_CAPSULE | Freq: Three times a day (TID) | ORAL | Status: DC | PRN

## 2012-07-17 NOTE — Telephone Encounter (Signed)
Patient Information:  Caller Name: Mairlyn  Phone: [phone]  Patient: Christina [name], Christina [name]  Gender: Female  DOB: Jun 20, 1941  Age: 71 Years  PCP: Dan[facility])   Symptoms  Reason For Call & Symptoms: Was seen in office on 07/10/12 and diagnosed with sinusitis  Now has a cough and is requesting something for that.  She is still on antibiotic.  Is sore from coughing and not sleeping well at night.  Reviewed Health History In EMR: Yes  Reviewed Medications In EMR: Yes  Reviewed Allergies In EMR: Yes  Date of Onset of Symptoms: 06/13/2012  Guideline(s) Used:  Cough  Disposition Per Guideline:   See Within 3 Days in Office  Reason For Disposition Reached:   Taking an ACE Inhibitor medication (e.g., benazepril/LOTENSIN, captopril/CAPOTEN, enalapril/VASOTEC, lisinopril/ZESTRIL)  Advice Given:  Reassurance  Coughing is the way that our lungs remove irritants and mucus. It helps protect our lungs from getting pneumonia.  You can get a dry hacking cough after a chest cold. Sometimes this type of cough can last 1-3 weeks, and be worse at night.  Reassurance  Coughing is the way that our lungs remove irritants and mucus. It helps protect our lungs from getting pneumonia.  You can get a dry hacking cough after a chest cold. Sometimes this type of cough can last 1-3 weeks, and be worse at night.  Cough Medicines:  Home Remedy - Honey  Coughing Spasms:  Drink warm fluids. Inhale warm mist&nbsp;(Reason: both relax the airway and loosen up the phlegm).  Suck on cough drops or hard candy to coat the irritated throat.  Coughing Spasms:  Drink warm fluids. Inhale warm mist&nbsp;(Reason: both relax the airway and loosen up the phlegm).  Suck on cough drops or hard candy to coat the irritated throat.  Prevent Dehydration:  Drink adequate liquids.  Prevent Dehydration:  Drink adequate liquids.  This will help soothe an irritated or dry throat and loosen up the phlegm.  Expected Course:   The expected course depends on what is causing the cough.  Expected Course:   The expected course depends on what is causing the cough.  Viral bronchitis (chest cold) causes a cough that lasts 1 to 3 weeks. Sometimes you may cough up lots of phlegm (sputum, mucus). The mucus can normally be white, gray, yellow, or green.  Call Back If:  Difficulty breathing  Call Back If:  Difficulty breathing  Cough lasts more than 3 weeks  Call Back If:  Difficulty breathing  Cough lasts more than 3 weeks  Fever lasts &gt; 3 days  Call Back If:  Difficulty breathing  Cough lasts more than 3 weeks  Fever lasts &gt; 3 days  You become worse.  Office Follow Up:  Does the office need to follow up with this patient?: Yes  Instructions For The Office: Please follow up with patient regarding request for something else for cough.  Is on Tussinex Pennkinetic ER and it is not working-cough worse at night and feels like it has gone into her chest. Drug store is [facility] [phone]  RN Overrode Recommendation:  Follow Up With Office Later  Patient is requesting something be called in for cough rather than come back to office at this time.  RN Note:  Current cough medicine is not helping with coughing; Cough sounds wet with no color.  Cough is worse at night.  Other than cough she is feeling better.  Has on hand Tussinex Pennkinetic ER--which may make her sleep  but she still coughs.  Feels like it is in her chest. Patient is requesting something to be called in for her cough.

## 2012-07-17 NOTE — Telephone Encounter (Signed)
Pt informed and RX sent to pharmacy  

## 2012-07-17 NOTE — Telephone Encounter (Signed)
Please advise 

## 2012-07-17 NOTE — Telephone Encounter (Signed)
Can try Tessalon perles 200 mg po tid prn cough, disp #30. No rf

## 2012-07-23 ENCOUNTER — Ambulatory Visit: Payer: Medicare Other | Admitting: Family Medicine

## 2012-07-25 ENCOUNTER — Other Ambulatory Visit: Payer: Self-pay | Admitting: Family Medicine

## 2012-08-01 ENCOUNTER — Ambulatory Visit: Payer: Medicare Other | Admitting: Family Medicine

## 2012-08-04 ENCOUNTER — Other Ambulatory Visit: Payer: Self-pay | Admitting: Family Medicine

## 2012-08-06 ENCOUNTER — Ambulatory Visit: Payer: Medicare Other | Admitting: Family Medicine

## 2012-08-13 ENCOUNTER — Telehealth: Payer: Self-pay

## 2012-08-13 NOTE — Telephone Encounter (Signed)
I called pt per MD to ask a few questions for Omeprazole PA form. Pt stated that she has been taking OTC medication (not sure of the name) and it works usually after she takes it but doesn't help exactly like the Omeprazole did. Pt stated that the problem has been worse more recently but hasn't had any problems in the last couple of days. Pt stated she had an endoscopy a few months ago and they found an ulcer, hernia, acid reflux. PA put back on MD's desk

## 2012-08-14 NOTE — Telephone Encounter (Signed)
PA faxed to [facility].

## 2012-10-12 ENCOUNTER — Other Ambulatory Visit: Payer: Self-pay | Admitting: Family Medicine

## 2012-11-08 ENCOUNTER — Other Ambulatory Visit: Payer: Self-pay | Admitting: Family Medicine

## 2012-11-10 NOTE — Telephone Encounter (Signed)
Please advise Xanax refill? Last RX wrote on 07-10-12 quantity 60 with 3 refills.  If ok fax to [phone]

## 2012-11-26 ENCOUNTER — Other Ambulatory Visit: Payer: Self-pay

## 2012-11-26 MED ORDER — LOSARTAN POTASSIUM 50 MG PO TABS: 50.0000 mg | ORAL_TABLET | Freq: Every day | ORAL | Status: DC

## 2012-11-26 NOTE — Telephone Encounter (Signed)
RX sent

## 2012-12-08 ENCOUNTER — Other Ambulatory Visit: Payer: Self-pay | Admitting: Family Medicine

## 2012-12-08 NOTE — Telephone Encounter (Signed)
Rx sent in to pharmacy. 

## 2013-01-16 ENCOUNTER — Encounter: Payer: Self-pay | Admitting: Family Medicine

## 2013-03-12 ENCOUNTER — Telehealth: Payer: Self-pay | Admitting: Family Medicine

## 2013-03-12 DIAGNOSIS — M899 Disorder of bone, unspecified: Secondary | ICD-10-CM

## 2013-03-12 DIAGNOSIS — I10 Essential (primary) hypertension: Secondary | ICD-10-CM

## 2013-03-12 DIAGNOSIS — M949 Disorder of cartilage, unspecified: Secondary | ICD-10-CM

## 2013-03-12 NOTE — Telephone Encounter (Signed)
Patient has cpe on 04/29/13 and wants to do labs prior. She will be going to [facility] lab

## 2013-03-12 NOTE — Telephone Encounter (Signed)
Check vitamin d for osteopenia, check lipid, renal, cbc, tsh, hepatic for htn

## 2013-03-12 NOTE — Telephone Encounter (Signed)
Please advise which labs and diagnosis? 

## 2013-03-16 ENCOUNTER — Other Ambulatory Visit: Payer: Self-pay | Admitting: Family Medicine

## 2013-03-16 MED ORDER — ALPRAZOLAM 0.5 MG PO TABS: 0.5000 mg | ORAL_TABLET | Freq: Two times a day (BID) | ORAL | Status: DC | PRN

## 2013-03-16 NOTE — Telephone Encounter (Signed)
Please advise refill? Last RX was done on 11-08-12 quantity 60 with 3 refills  If ok fax to [phone]

## 2013-03-16 NOTE — Telephone Encounter (Signed)
Refill- alprazolam 0.5mg  tablet. Take one tablet by mouth twice a day. Last fill 6.22.14

## 2013-04-12 ENCOUNTER — Other Ambulatory Visit: Payer: Self-pay | Admitting: Family Medicine

## 2013-04-13 NOTE — Telephone Encounter (Signed)
RX sent. Pt has appt in Sept

## 2013-04-29 ENCOUNTER — Encounter: Payer: Medicare Other | Admitting: Family Medicine

## 2013-05-11 ENCOUNTER — Other Ambulatory Visit: Payer: Self-pay | Admitting: Family Medicine

## 2013-05-12 ENCOUNTER — Other Ambulatory Visit: Payer: Self-pay | Admitting: *Deleted

## 2013-05-12 MED ORDER — OMEPRAZOLE 40 MG PO CPDR: 40.0000 mg | DELAYED_RELEASE_CAPSULE | Freq: Every day | ORAL | Status: DC

## 2013-05-25 ENCOUNTER — Other Ambulatory Visit: Payer: Self-pay | Admitting: *Deleted

## 2013-05-25 MED ORDER — LOSARTAN POTASSIUM 50 MG PO TABS: 50.0000 mg | ORAL_TABLET | Freq: Every day | ORAL | Status: DC

## 2013-05-25 NOTE — Telephone Encounter (Signed)
Rx request to pharmacy; *PATIENT DUE FOR FOLLOW-UP OFFICE VISIT*/SLS    

## 2013-05-26 ENCOUNTER — Other Ambulatory Visit: Payer: Self-pay | Admitting: Family Medicine

## 2013-05-26 ENCOUNTER — Encounter: Payer: Self-pay | Admitting: Family Medicine

## 2013-05-26 ENCOUNTER — Telehealth: Payer: Self-pay | Admitting: Family Medicine

## 2013-05-26 ENCOUNTER — Ambulatory Visit (INDEPENDENT_AMBULATORY_CARE_PROVIDER_SITE_OTHER): Payer: MEDICARE | Admitting: Family Medicine

## 2013-05-26 VITALS — BP 132/80 | HR 63 | Temp 98.0°F | Ht 65.0 in | Wt 188.0 lb

## 2013-05-26 DIAGNOSIS — Z23 Encounter for immunization: Secondary | ICD-10-CM

## 2013-05-26 DIAGNOSIS — M899 Disorder of bone, unspecified: Secondary | ICD-10-CM

## 2013-05-26 DIAGNOSIS — I10 Essential (primary) hypertension: Secondary | ICD-10-CM

## 2013-05-26 DIAGNOSIS — Z0000 Encounter for general adult medical examination without abnormal findings: Secondary | ICD-10-CM

## 2013-05-26 DIAGNOSIS — K219 Gastro-esophageal reflux disease without esophagitis: Secondary | ICD-10-CM

## 2013-05-26 DIAGNOSIS — R350 Frequency of micturition: Secondary | ICD-10-CM

## 2013-05-26 DIAGNOSIS — F329 Major depressive disorder, single episode, unspecified: Secondary | ICD-10-CM

## 2013-05-26 DIAGNOSIS — K449 Diaphragmatic hernia without obstruction or gangrene: Secondary | ICD-10-CM

## 2013-05-26 LAB — HEPATIC FUNCTION PANEL
ALT: 14 U/L (ref 0–35)
AST: 18 U/L (ref 0–37)
Albumin: 4.2 g/dL (ref 3.5–5.2)
Alkaline Phosphatase: 65 U/L (ref 39–117)
Bilirubin, Direct: 0.2 mg/dL (ref 0.0–0.3)
Indirect Bilirubin: 0.6 mg/dL (ref 0.0–0.9)
Total Bilirubin: 0.8 mg/dL (ref 0.3–1.2)
Total Protein: 6.8 g/dL (ref 6.0–8.3)

## 2013-05-26 LAB — CBC
HCT: 43.5 % (ref 36.0–46.0)
Hemoglobin: 14.9 g/dL (ref 12.0–15.0)
MCH: 32 pg (ref 26.0–34.0)
MCHC: 34.3 g/dL (ref 30.0–36.0)
MCV: 93.5 fL (ref 78.0–100.0)
Platelets: 263 10*3/uL (ref 150–400)
RBC: 4.65 MIL/uL (ref 3.87–5.11)
RDW: 13.9 % (ref 11.5–15.5)
WBC: 5.1 10*3/uL (ref 4.0–10.5)

## 2013-05-26 LAB — LIPID PANEL
Cholesterol: 201 mg/dL — ABNORMAL HIGH (ref 0–200)
HDL: 60 mg/dL (ref >39)
LDL Cholesterol: 126 mg/dL — ABNORMAL HIGH (ref 0–99)
Total CHOL/HDL Ratio: 3.4 Ratio
Triglycerides: 77 mg/dL (ref <150)
VLDL: 15 mg/dL (ref 0–40)

## 2013-05-26 LAB — RENAL FUNCTION PANEL
Albumin: 4.2 g/dL (ref 3.5–5.2)
BUN: 16 mg/dL (ref 6–23)
CO2: 31 mEq/L (ref 19–32)
Calcium: 10.4 mg/dL (ref 8.4–10.5)
Chloride: 106 mEq/L (ref 96–112)
Creat: 0.85 mg/dL (ref 0.50–1.10)
Glucose, Bld: 105 mg/dL — ABNORMAL HIGH (ref 70–99)
Phosphorus: 2.8 mg/dL (ref 2.3–4.6)
Potassium: 4.8 mEq/L (ref 3.5–5.3)
Sodium: 141 mEq/L (ref 135–145)

## 2013-05-26 LAB — TSH: TSH: 2.085 u[IU]/mL (ref 0.350–4.500)

## 2013-05-26 NOTE — Patient Instructions (Addendum)
Try Melatonin 5 to 10 mg at bedtime Salon Pas cream or patches for hip, neck or foot  Digestive Advantage probiotic daily  Insomnia Insomnia is frequent trouble falling and/or staying asleep. Insomnia can be a long term problem or a short term problem. Both are common. Insomnia can be a short term problem when the wakefulness is related to a certain stress or worry. Long term insomnia is often related to ongoing stress during waking hours and/or poor sleeping habits. Overtime, sleep deprivation itself can make the problem worse. Every little thing feels more severe because you are overtired and your ability to cope is decreased. CAUSES   Stress, anxiety, and depression.  Poor sleeping habits.  Distractions such as TV in the bedroom.  Naps close to bedtime.  Engaging in emotionally charged conversations before bed.  Technical reading before sleep.  Alcohol and other sedatives. They may make the problem worse. They can hurt normal sleep patterns and normal dream activity.  Stimulants such as caffeine for several hours prior to bedtime.  Pain syndromes and shortness of breath can cause insomnia.  Exercise late at night.  Changing time zones may cause sleeping problems (jet lag). It is sometimes helpful to have someone observe your sleeping patterns. They should look for periods of not breathing during the night (sleep apnea). They should also look to see how long those periods last. If you live alone or observers are uncertain, you can also be observed at a sleep clinic where your sleep patterns will be professionally monitored. Sleep apnea requires a checkup and treatment. Give your caregivers your medical history. Give your caregivers observations your family has made about your sleep.  SYMPTOMS   Not feeling rested in the morning.  Anxiety and restlessness at bedtime.  Difficulty falling and staying asleep. TREATMENT   Your caregiver may prescribe treatment for an underlying  medical disorders. Your caregiver can give advice or help if you are using alcohol or other drugs for self-medication. Treatment of underlying problems will usually eliminate insomnia problems.  Medications can be prescribed for short time use. They are generally not recommended for lengthy use.  Over-the-counter sleep medicines are not recommended for lengthy use. They can be habit forming.  You can promote easier sleeping by making lifestyle changes such as:  Using relaxation techniques that help with breathing and reduce muscle tension.  Exercising earlier in the day.  Changing your diet and the time of your last meal. No night time snacks.  Establish a regular time to go to bed.  Counseling can help with stressful problems and worry.  Soothing music and white noise may be helpful if there are background noises you cannot remove.  Stop tedious detailed work at least one hour before bedtime. HOME CARE INSTRUCTIONS   Keep a diary. Inform your caregiver about your progress. This includes any medication side effects. See your caregiver regularly. Take note of:  Times when you are asleep.  Times when you are awake during the night.  The quality of your sleep.  How you feel the next day. This information will help your caregiver care for you.  Get out of bed if you are still awake after 15 minutes. Read or do some quiet activity. Keep the lights down. Wait until you feel sleepy and go back to bed.  Keep regular sleeping and waking hours. Avoid naps.  Exercise regularly.  Avoid distractions at bedtime. Distractions include watching television or engaging in any intense or detailed activity like attempting to  balance the household checkbook.  Develop a bedtime ritual. Keep a familiar routine of bathing, brushing your teeth, climbing into bed at the same time each night, listening to soothing music. Routines increase the success of falling to sleep faster.  Use relaxation  techniques. This can be using breathing and muscle tension release routines. It can also include visualizing peaceful scenes. You can also help control troubling or intruding thoughts by keeping your mind occupied with boring or repetitive thoughts like the old concept of counting sheep. You can make it more creative like imagining planting one beautiful flower after another in your backyard garden.  During your day, work to eliminate stress. When this is not possible use some of the previous suggestions to help reduce the anxiety that accompanies stressful situations. MAKE SURE YOU:   Understand these instructions.  Will watch your condition.  Will get help right away if you are not doing well or get worse. Document Released: 08/10/2000 Document Revised: 11/05/2011 Document Reviewed: 09/10/2007 [facility] Patient Information 2014 [place], [place].

## 2013-05-26 NOTE — Assessment & Plan Note (Signed)
Continue calcium with vitamin d and exercise

## 2013-05-26 NOTE — Telephone Encounter (Signed)
I will check with Katrina in the morning to see if this is done

## 2013-05-26 NOTE — Telephone Encounter (Signed)
Please advise 

## 2013-05-26 NOTE — Telephone Encounter (Signed)
Does patient need to do labs prior to next appointment? 6 month follow up?  She was in today for cpe but checkout states to come back in in 6 months for cpe with labs prior??

## 2013-05-26 NOTE — Telephone Encounter (Signed)
I know what happened initially I planned to bring her back in a year and so I wrote for annual but then after talking changed my mind and brought her back in 6 months so next visit does not have to be an annual but she does need the labs prior, did she do the labs yesterday?

## 2013-05-26 NOTE — Assessment & Plan Note (Signed)
Encouraged heart healthy diet, exercise as tolerated, regular sleep. Reviewed annual labs. MGM was 2013, colonoscopy was 2006. No difficulty with worsening depression, no falls, no changes in hearing or vision. No difficulty with ADLs

## 2013-05-26 NOTE — Assessment & Plan Note (Signed)
Stable on current meds use her Alprazolam primarily for sleep. Has been struggling with some increased stressors at home with her family and has chosen to start some counseling. No change in meds.

## 2013-05-26 NOTE — Progress Notes (Signed)
Patient ID: Christina [name], female   DOB: Jun 22, 1941, 72 y.o.   MRN: [id number] MONTANNA MCBAIN [id number] [birth year] 05/26/2013      Progress Note-Follow Up  Subjective  Chief Complaint  Chief Complaint  Patient presents with  . Annual Exam    physical    HPI  Patient is a 72 year old Caucasian female who is in today for annual exam. She has been under increased stress recently with family issues but otherwise feels she's doing well. He acknowledges she has trouble sleeping as a result of all the anxiety. Has trouble falling asleep and staying asleep. Has occasional palpitations but they are rare and did not have associated symptoms. No chest pain or shortness of breath. No GI or GU complaints at this time. Has had trouble intermittently with heartburn but this is essentially well controlled at this time. He is taking medications as prescribed. Does acknowledge some mild urinary frequency  Past Medical History  Diagnosis Date  . Vertigo   . Chicken pox as a child  . Measles as a child  . Mumps as a child  . Asthma     a little?  [name] Hypertension   . Urinary frequency 09/28/2011  . Cervical cancer screening 01/31/2012  . Dysphagia 01/31/2012  . Routine health maintenance 01/31/2012  . Sinusitis, acute 07/10/2012  . Hiatal hernia with gastroesophageal reflux 10/26/2010    Qualifier: Diagnosis of  By: Linford Arnold MD, Santina Evans    . Dermatitis of external ear 07/11/2012    Past Surgical History  Procedure Laterality Date  . Breast biopsy  1960, 70's  . Tubal ligation  1970  . Cystectomy      abdomen    Family History  Problem Relation Age of Onset  . Cancer Mother 4    lung- smoker, ovarian  . Alcohol abuse Father   . Cancer Sister     ovarian  . Diabetes Sister   . Hypertension Sister   . Other Maternal Grandmother     pacemaker  . Multiple sclerosis Maternal Grandfather     ?    History   Social History  . Marital Status: Widowed    Spouse Name: N/A    Number of  Children: N/A  . Years of Education: N/A   Occupational History  . Not on file.   Social History Main Topics  . Smoking status: Former Smoker -- 1.00 packs/day for 30 years    Types: Cigarettes    Quit date: 08/27/1990  . Smokeless tobacco: Never Used  . Alcohol Use: No  . Drug Use: No  . Sexual Activity: Not on file   Other Topics Concern  . Not on file   Social History Narrative  . No narrative on file    Current Outpatient Prescriptions on File Prior to Visit  Medication Sig Dispense Refill  . ALPRAZolam (XANAX) 0.5 MG tablet Take 1 tablet (0.5 mg total) by mouth 2 (two) times daily as needed for sleep or anxiety.  60 tablet  3  . Calcium Carbonate (CALCIUM 600 PO) Take by mouth.        . Cholecalciferol (VITAMIN D) 1000 UNITS capsule Take 1,000 Units by mouth daily.        . fish oil-omega-3 fatty acids 1000 MG capsule Take 1 g by mouth daily.        [name] losartan (COZAAR) 50 MG tablet Take 1 tablet (50 mg total) by mouth daily.  90 tablet  0  . omeprazole (PRILOSEC) 40  MG capsule Take 1 capsule (40 mg total) by mouth daily.  30 capsule  1  . triamcinolone cream (KENALOG) 0.1 % Apply topically 2 (two) times daily.  45 g  0   No current facility-administered medications on file prior to visit.    Allergies  Allergen Reactions  . Hydrochlorothiazide     REACTION: cough  . Lisinopril     REACTION: cough    Review of Systems  Review of Systems  Constitutional: Negative for fever and malaise/fatigue.  HENT: Negative for congestion.   Eyes: Negative for discharge.  Respiratory: Negative for shortness of breath.   Cardiovascular: Negative for chest pain, palpitations and leg swelling.  Gastrointestinal: Negative for nausea, abdominal pain and diarrhea.  Genitourinary: Negative for dysuria.  Musculoskeletal: Negative for falls.  Skin: Negative for rash.  Neurological: Negative for loss of consciousness and headaches.  Endo/Heme/Allergies: Negative for polydipsia.   Psychiatric/Behavioral: Negative for depression and suicidal ideas. The patient is nervous/anxious and has insomnia.     Objective  BP 132/80  Pulse 63  Temp(Src) 98 F (36.7 C) (Oral)  Ht 5\' 5"  (1.651 m)  Wt 188 lb (85.276 kg)  BMI 31.28 kg/m2  SpO2 96%  Physical Exam  Physical Exam  Constitutional: She is oriented to person, place, and time and well-developed, well-nourished, and in no distress. No distress.  HENT:  Head: Normocephalic and atraumatic.  Eyes: Conjunctivae are normal.  Neck: Neck supple. No thyromegaly present.  Cardiovascular: Normal rate, regular rhythm and normal heart sounds.   No murmur heard. Pulmonary/Chest: Effort normal and breath sounds normal. She has no wheezes.  Abdominal: She exhibits no distension and no mass.  Musculoskeletal: She exhibits no edema.  Lymphadenopathy:    She has no cervical adenopathy.  Neurological: She is alert and oriented to person, place, and time.  Skin: Skin is warm and dry. No rash noted. She is not diaphoretic.  Psychiatric: Memory, affect and judgment normal.    Lab Results  Component Value Date   TSH 1.15 01/31/2012   Lab Results  Component Value Date   WBC 6.0 01/31/2012   HGB 14.4 01/31/2012   HCT 43.7 01/31/2012   MCV 97.0 01/31/2012   PLT 219.0 01/31/2012   Lab Results  Component Value Date   CREATININE 0.8 01/31/2012   BUN 19 01/31/2012   NA 142 01/31/2012   K 4.2 01/31/2012   CL 106 01/31/2012   CO2 29 01/31/2012   Lab Results  Component Value Date   ALT 14 01/31/2012   AST 25 01/31/2012   ALKPHOS 60 01/31/2012   BILITOT 1.2 01/31/2012   Lab Results  Component Value Date   CHOL 165 01/31/2012   Lab Results  Component Value Date   HDL 63.20 01/31/2012   Lab Results  Component Value Date   LDLCALC 87 01/31/2012   Lab Results  Component Value Date   TRIG 72.0 01/31/2012   Lab Results  Component Value Date   CHOLHDL 3 01/31/2012     Assessment & Plan  Essential hypertension, benign Well controlled, no  changes.  Hiatal hernia with gastroesophageal reflux Well controlled, encouraged probiotics   Routine health maintenance Encouraged heart healthy diet, exercise as tolerated, regular sleep. Reviewed annual labs. MGM was 2013, colonoscopy was 2006. No difficulty with worsening depression, no falls, no changes in hearing or vision. No difficulty with ADLs  OSTEOPENIA Continue calcium with vitamin d and exercise  DEPRESSION Stable on current meds use her Alprazolam primarily for sleep.  Has been struggling with some increased stressors at home with her family and has chosen to start some counseling. No change in meds.

## 2013-05-26 NOTE — Assessment & Plan Note (Addendum)
Well controlled, encouraged probiotics

## 2013-05-26 NOTE — Assessment & Plan Note (Signed)
Well controlled, no changes 

## 2013-05-27 LAB — URINALYSIS
Bilirubin Urine: NEGATIVE
Glucose, UA: NEGATIVE mg/dL
Hgb urine dipstick: NEGATIVE
Ketones, ur: NEGATIVE mg/dL
Nitrite: NEGATIVE
Protein, ur: NEGATIVE mg/dL
Specific Gravity, Urine: 1.01 (ref 1.005–1.030)
Urobilinogen, UA: 0.2 mg/dL (ref 0.0–1.0)
pH: 6.5 (ref 5.0–8.0)

## 2013-05-27 NOTE — Telephone Encounter (Signed)
Labs where done today

## 2013-06-11 ENCOUNTER — Other Ambulatory Visit: Payer: Self-pay | Admitting: Family Medicine

## 2013-06-11 DIAGNOSIS — Z1231 Encounter for screening mammogram for malignant neoplasm of breast: Secondary | ICD-10-CM

## 2013-06-19 ENCOUNTER — Ambulatory Visit (INDEPENDENT_AMBULATORY_CARE_PROVIDER_SITE_OTHER): Payer: MEDICARE | Admitting: Family Medicine

## 2013-06-19 ENCOUNTER — Encounter: Payer: Self-pay | Admitting: Family Medicine

## 2013-06-19 ENCOUNTER — Ambulatory Visit (HOSPITAL_BASED_OUTPATIENT_CLINIC_OR_DEPARTMENT_OTHER)
Admission: RE | Admit: 2013-06-19 | Discharge: 2013-06-19 | Disposition: A | Discharge status: Home / Self Care | Payer: Medicare Other | Source: Ambulatory Visit | Attending: Family Medicine | Admitting: Family Medicine

## 2013-06-19 VITALS — BP 108/68 | HR 71 | Temp 97.9°F | Ht 65.0 in | Wt 189.0 lb

## 2013-06-19 DIAGNOSIS — R7309 Other abnormal glucose: Secondary | ICD-10-CM

## 2013-06-19 DIAGNOSIS — G8929 Other chronic pain: Secondary | ICD-10-CM | POA: Insufficient documentation

## 2013-06-19 DIAGNOSIS — R739 Hyperglycemia, unspecified: Secondary | ICD-10-CM

## 2013-06-19 DIAGNOSIS — M542 Cervicalgia: Secondary | ICD-10-CM

## 2013-06-19 DIAGNOSIS — M5090 Cervical disc disorder, unspecified, unspecified cervical region: Secondary | ICD-10-CM | POA: Insufficient documentation

## 2013-06-19 DIAGNOSIS — M531 Cervicobrachial syndrome: Secondary | ICD-10-CM | POA: Insufficient documentation

## 2013-06-19 DIAGNOSIS — M47812 Spondylosis without myelopathy or radiculopathy, cervical region: Secondary | ICD-10-CM | POA: Insufficient documentation

## 2013-06-19 DIAGNOSIS — I10 Essential (primary) hypertension: Secondary | ICD-10-CM

## 2013-06-19 NOTE — Patient Instructions (Addendum)
Salon pas cream to feet when hurt Try adding Melatonin 5 to 10 mg at bedtime. Moist heat and stretch, massage and let us know if you want to try physical therapy  Back Pain, Adult Low back pain is very common. About 1 in 5 people have back pain.The cause of low back pain is rarely dangerous. The pain often gets better over time.About half of people with a sudden onset of back pain feel better in just 2 weeks. About 8 in 10 people feel better by 6 weeks.  CAUSES Some common causes of back pain include:  Strain of the muscles or ligaments supporting the spine.  Wear and tear (degeneration) of the spinal discs.  Arthritis.  Direct injury to the back. DIAGNOSIS Most of the time, the direct cause of low back pain is not known.However, back pain can be treated effectively even when the exact cause of the pain is unknown.Answering your caregiver's questions about your overall health and symptoms is one of the most accurate ways to make sure the cause of your pain is not dangerous. If your caregiver needs more information, he or she may order lab work or imaging tests (X-rays or MRIs).However, even if imaging tests show changes in your back, this usually does not require surgery. HOME CARE INSTRUCTIONS For many people, back pain returns.Since low back pain is rarely dangerous, it is often a condition that people can learn to [facility] their own.   Remain active. It is stressful on the back to sit or stand in one place. Do not sit, drive, or stand in one place for more than 30 minutes at a time. Take short walks on level surfaces as soon as pain allows.Try to increase the length of time you walk each day.  Do not stay in bed.Resting more than 1 or 2 days can delay your recovery.  Do not avoid exercise or work.Your body is made to move.It is not dangerous to be active, even though your back may hurt.Your back will likely heal faster if you return to being active before your pain is  gone.  Pay attention to your body when you bend and lift. Many people have less discomfortwhen lifting if they bend their knees, keep the load close to their bodies,and avoid twisting. Often, the most comfortable positions are those that put less stress on your recovering back.  Find a comfortable position to sleep. Use a firm mattress and lie on your side with your knees slightly bent. If you lie on your back, put a pillow under your knees.  Only take over-the-counter or prescription medicines as directed by your caregiver. Over-the-counter medicines to reduce pain and inflammation are often the most helpful.Your caregiver may prescribe muscle relaxant drugs.These medicines help dull your pain so you can more quickly return to your normal activities and healthy exercise.  Put ice on the injured area.  Put ice in a plastic bag.  Place a towel between your skin and the bag.  Leave the ice on for 15-20 minutes, 3-4 times a day for the first 2 to 3 days. After that, ice and heat may be alternated to reduce pain and spasms.  Ask your caregiver about trying back exercises and gentle massage. This may be of some benefit.  Avoid feeling anxious or stressed.Stress increases muscle tension and can worsen back pain.It is important to recognize when you are anxious or stressed and learn ways to manage it.Exercise is a great option. SEEK MEDICAL CARE IF:  You have  pain that is not relieved with rest or medicine.  You have pain that does not improve in 1 week.  You have new symptoms.  You are generally not feeling well. SEEK IMMEDIATE MEDICAL CARE IF:   You have pain that radiates from your back into your legs.  You develop new bowel or bladder control problems.  You have unusual weakness or numbness in your arms or legs.  You develop nausea or vomiting.  You develop abdominal pain.  You feel faint. Document Released: 08/13/2005 Document Revised: 02/12/2012 Document Reviewed:  01/01/2011 [facility] Patient Information 2014 [place], [place].

## 2013-06-20 LAB — HEMOGLOBIN A1C
Hgb A1c MFr Bld: 5.9 % — ABNORMAL HIGH (ref <5.7)
Mean Plasma Glucose: 123 mg/dL — ABNORMAL HIGH (ref <117)

## 2013-06-21 ENCOUNTER — Encounter: Payer: Self-pay | Admitting: Family Medicine

## 2013-06-21 DIAGNOSIS — R739 Hyperglycemia, unspecified: Secondary | ICD-10-CM | POA: Insufficient documentation

## 2013-06-21 DIAGNOSIS — M542 Cervicalgia: Secondary | ICD-10-CM | POA: Insufficient documentation

## 2013-06-21 HISTORY — DX: Hyperglycemia, unspecified: R73.9

## 2013-06-21 NOTE — Assessment & Plan Note (Signed)
Xray reveals some significant degenerative disease with resulting subluxations. Will refer patient to neurosurgery if she agrees. May try Salon pas prn pain for now.

## 2013-06-21 NOTE — Assessment & Plan Note (Signed)
Well controlled, no changes 

## 2013-06-21 NOTE — Assessment & Plan Note (Signed)
hgba1c 5.9. Avoid simple carbs

## 2013-06-21 NOTE — Progress Notes (Signed)
Patient ID: Christina [name], female   DOB: 02-Jan-1941, 72 y.o.   MRN: [id number] HANNIE SHOE [id number] Oct 20, 1940 06/21/2013      Progress Note-Follow Up  Subjective  Chief Complaint  Chief Complaint  Patient presents with  . Neck Pain    HPI  72 year old Caucasian female who is in today for evaluation of some left-sided neck pain. She says is has been present for about 2 months. It is slowly worsening. At times he is difficult to turn her neck secondary to the pain. There is some mild radicular symptoms in her shoulder Dorinda Hill the way down her arm. No falls or recent trauma. She denies headaches, chest pain, palpitations, shortness of breath, GI or GU concerns at this time. She does note she often is a burning sensation on the balls of her feet after being on her feet for a long period of time.  Past Medical History  Diagnosis Date  . Vertigo   . Chicken pox as a child  . Measles as a child  . Mumps as a child  . Asthma     a little?  [name] Hypertension   . Urinary frequency 09/28/2011  . Cervical cancer screening 01/31/2012  . Dysphagia 01/31/2012  . Routine health maintenance 01/31/2012  . Sinusitis, acute 07/10/2012  . Hiatal hernia with gastroesophageal reflux 10/26/2010    Qualifier: Diagnosis of  By: Linford Arnold MD, Santina Evans    . Dermatitis of external ear 07/11/2012  . Neck pain on left side 06/21/2013  . Hyperglycemia 06/21/2013    Past Surgical History  Procedure Laterality Date  . Breast biopsy  1960, 70's  . Tubal ligation  1970  . Cystectomy      abdomen    Family History  Problem Relation Age of Onset  . Cancer Mother 53    lung- smoker, ovarian  . Alcohol abuse Father   . Cancer Sister     ovarian  . Diabetes Sister   . Hypertension Sister   . Other Maternal Grandmother     pacemaker  . Multiple sclerosis Maternal Grandfather     ?    History   Social History  . Marital Status: Widowed    Spouse Name: N/A    Number of Children: N/A  . Years  of Education: N/A   Occupational History  . Not on file.   Social History Main Topics  . Smoking status: Former Smoker -- 1.00 packs/day for 30 years    Types: Cigarettes    Quit date: 08/27/1990  . Smokeless tobacco: Never Used  . Alcohol Use: No  . Drug Use: No  . Sexual Activity: Not on file   Other Topics Concern  . Not on file   Social History Narrative  . No narrative on file    Current Outpatient Prescriptions on File Prior to Visit  Medication Sig Dispense Refill  . ALPRAZolam (XANAX) 0.5 MG tablet Take 1 tablet (0.5 mg total) by mouth 2 (two) times daily as needed for sleep or anxiety.  60 tablet  3  . Calcium Carbonate (CALCIUM 600 PO) Take by mouth.        . Cholecalciferol (VITAMIN D) 1000 UNITS capsule Take 1,000 Units by mouth daily.        . fish oil-omega-3 fatty acids 1000 MG capsule Take 1 g by mouth daily.        [name] losartan (COZAAR) 50 MG tablet Take 1 tablet (50 mg total) by mouth daily.  90 tablet  0  . omeprazole (PRILOSEC) 40 MG capsule Take 1 capsule (40 mg total) by mouth daily.  30 capsule  1  . triamcinolone cream (KENALOG) 0.1 % Apply topically 2 (two) times daily.  45 g  0   No current facility-administered medications on file prior to visit.    Allergies  Allergen Reactions  . Hydrochlorothiazide     REACTION: cough  . Lisinopril     REACTION: cough    Review of Systems  Review of Systems  Constitutional: Negative for fever and malaise/fatigue.  HENT: Negative for congestion.   Eyes: Negative for discharge.  Respiratory: Negative for shortness of breath.   Cardiovascular: Negative for chest pain, palpitations and leg swelling.  Gastrointestinal: Negative for nausea, abdominal pain and diarrhea.  Genitourinary: Negative for dysuria.  Musculoskeletal: Positive for neck pain. Negative for falls.  Skin: Negative for rash.  Neurological: Negative for loss of consciousness and headaches.  Endo/Heme/Allergies: Negative for polydipsia.   Psychiatric/Behavioral: Negative for depression and suicidal ideas. The patient is not nervous/anxious and does not have insomnia.     Objective  BP 108/68  Pulse 71  Temp(Src) 97.9 F (36.6 C) (Oral)  Ht 5\' 5"  (1.651 m)  Wt 189 lb (85.73 kg)  BMI 31.45 kg/m2  SpO2 96%  Physical Exam  Physical Exam  Constitutional: She is oriented to person, place, and time and well-developed, well-nourished, and in no distress. No distress.  HENT:  Head: Normocephalic and atraumatic.  Eyes: Conjunctivae are normal.  Neck: Neck supple. No thyromegaly present.  Cardiovascular: Normal rate, regular rhythm and normal heart sounds.   No murmur heard. Pulmonary/Chest: Effort normal and breath sounds normal. She has no wheezes.  Abdominal: She exhibits no distension and no mass.  Musculoskeletal: She exhibits no edema.  Lymphadenopathy:    She has no cervical adenopathy.  Neurological: She is alert and oriented to person, place, and time.  Skin: Skin is warm and dry. No rash noted. She is not diaphoretic.  Psychiatric: Memory, affect and judgment normal.    Lab Results  Component Value Date   TSH 2.085 05/26/2013   Lab Results  Component Value Date   WBC 5.1 05/26/2013   HGB 14.9 05/26/2013   HCT 43.5 05/26/2013   MCV 93.5 05/26/2013   PLT 263 05/26/2013   Lab Results  Component Value Date   CREATININE 0.85 05/26/2013   BUN 16 05/26/2013   NA 141 05/26/2013   K 4.8 05/26/2013   CL 106 05/26/2013   CO2 31 05/26/2013   Lab Results  Component Value Date   ALT 14 05/26/2013   AST 18 05/26/2013   ALKPHOS 65 05/26/2013   BILITOT 0.8 05/26/2013   Lab Results  Component Value Date   CHOL 201* 05/26/2013   Lab Results  Component Value Date   HDL 60 05/26/2013   Lab Results  Component Value Date   LDLCALC 126* 05/26/2013   Lab Results  Component Value Date   TRIG 77 05/26/2013   Lab Results  Component Value Date   CHOLHDL 3.4 05/26/2013     Assessment & Plan  Essential hypertension,  benign Well controlled, no changes.   Neck pain on left side Xray reveals some significant degenerative disease with resulting subluxations. Will refer patient to neurosurgery if she agrees. May try Salon pas prn pain for now.   Hyperglycemia hgba1c 5.9. Avoid simple carbs

## 2013-06-22 ENCOUNTER — Encounter: Payer: Self-pay | Admitting: Family Medicine

## 2013-06-23 ENCOUNTER — Other Ambulatory Visit: Payer: Self-pay | Admitting: Family Medicine

## 2013-06-23 ENCOUNTER — Telehealth: Payer: Self-pay

## 2013-06-23 DIAGNOSIS — M542 Cervicalgia: Secondary | ICD-10-CM

## 2013-06-23 NOTE — Telephone Encounter (Signed)
Yes you did. Did you order. Pt is ok with this

## 2013-06-23 NOTE — Telephone Encounter (Signed)
Message copied by Court Joy on Tue Jun 23, 2013  6:36 PM ------      Message from: Danise Edge A      Created: Sun Jun 21, 2013  5:46 PM       Another one I believe I left a message saying I want her to see a neurosurgeon due to her neck. Please make sure that is ok with her and I will order.  ------

## 2013-06-25 ENCOUNTER — Ambulatory Visit (HOSPITAL_COMMUNITY)
Admission: RE | Admit: 2013-06-25 | Discharge: 2013-06-25 | Disposition: A | Discharge status: Home / Self Care | Payer: Medicare Other | Source: Ambulatory Visit | Attending: Family Medicine | Admitting: Family Medicine

## 2013-06-25 DIAGNOSIS — Z1231 Encounter for screening mammogram for malignant neoplasm of breast: Secondary | ICD-10-CM | POA: Insufficient documentation

## 2013-08-21 ENCOUNTER — Other Ambulatory Visit: Payer: Self-pay | Admitting: Family Medicine

## 2013-08-21 ENCOUNTER — Ambulatory Visit (INDEPENDENT_AMBULATORY_CARE_PROVIDER_SITE_OTHER): Payer: Medicare Other | Admitting: Nurse Practitioner

## 2013-08-21 VITALS — BP 120/60 | HR 99 | Temp 98.9°F | Resp 12

## 2013-08-21 DIAGNOSIS — J111 Influenza due to unidentified influenza virus with other respiratory manifestations: Secondary | ICD-10-CM

## 2013-08-21 MED ORDER — OSELTAMIVIR PHOSPHATE 75 MG PO CAPS: 75.0000 mg | ORAL_CAPSULE | Freq: Two times a day (BID) | ORAL | Status: DC

## 2013-08-21 MED ORDER — BENZONATATE 100 MG PO CAPS: ORAL_CAPSULE | ORAL | Status: DC

## 2013-08-21 MED ORDER — GUAIFENESIN-CODEINE 300-10 MG/5ML PO LIQD: 5.0000 mL | Freq: Every evening | ORAL | Status: DC | PRN

## 2013-08-21 NOTE — Progress Notes (Signed)
   Subjective:    Patient ID: Christina [name], female    DOB: Apr 25, 1941, 72 y.o.   MRN: [id number]  Cough This is a new problem. The current episode started yesterday. The problem has been unchanged. The problem occurs hourly. The cough is non-productive. Associated symptoms include chest pain (burning pain under sternum), chills, ear pain (c/o shooting pain behind L ear, chronic), headaches, myalgias and nasal congestion. Pertinent negatives include no ear congestion, fever, postnasal drip, rash, sore throat, shortness of breath or wheezing. Associated symptoms comments: Arthralgia hands. Treatments tried: tylenol. The treatment provided mild relief. Her past medical history is significant for asthma.      Review of Systems  Constitutional: Positive for chills and fatigue. Negative for fever.  HENT: Positive for congestion (nasal) and ear pain (c/o shooting pain behind L ear, chronic). Negative for postnasal drip, sore throat and voice change.   Respiratory: Positive for cough. Negative for chest tightness, shortness of breath and wheezing.   Cardiovascular: Positive for chest pain (burning pain under sternum).  Gastrointestinal: Positive for nausea. Negative for vomiting, abdominal pain and diarrhea.  Musculoskeletal: Positive for arthralgias and myalgias. Negative for back pain and neck pain.  Skin: Negative for rash.  Neurological: Positive for headaches.  Hematological: Negative for adenopathy.       Objective:   Physical Exam  Vitals reviewed. Constitutional: She is oriented to person, place, and time. She appears well-developed and well-nourished. No distress.  HENT:  Head: Normocephalic and atraumatic.  Mouth/Throat: No oropharyngeal exudate.  Posterior pharynx erythematous. bilat TM opaque, no bulging or retraction. Injected vessels.  Eyes: Conjunctivae are normal. Right eye exhibits no discharge. Left eye exhibits no discharge.  Neck: Normal range of motion. Neck supple.  No thyromegaly present.  Cardiovascular: Normal rate, regular rhythm and normal heart sounds.   No murmur heard. Pulmonary/Chest: Effort normal and breath sounds normal. No respiratory distress. She has no wheezes.  Musculoskeletal:  No temporal artery tenderness, no joint tenderness in hands. Slight ulnar deviation in bilat 1st MCP.  Lymphadenopathy:    She has no cervical adenopathy.  Neurological: She is alert and oriented to person, place, and time.  Skin: Skin is warm and dry.  Psychiatric: She has a normal mood and affect. Her behavior is normal. Thought content normal.          Assessment & Plan:  1. Influenza Arthralgia, myalgia, chills, sudden onset - oseltamivir (TAMIFLU) 75 MG capsule; Take 1 capsule (75 mg total) by mouth 2 (two) times daily.  Dispense: 10 capsule; Refill: 0 - Guaifenesin-Codeine 300-10 MG/5ML LIQD; Take 5 mLs by mouth at bedtime as needed.  Dispense: 60 mL; Refill: 0 - benzonatate (TESSALON) 100 MG capsule; Take 1-2 capsules po up to 3 times daily PRN cough  Dispense: 60 capsule; Refill: 0

## 2013-08-21 NOTE — Patient Instructions (Signed)
You likely have flu. The average duration is 5-10 days. Treatment is largely symptom management, however the very young and folks older 58 can develop complications of flu. To decrease the possibility of complications, start tamiflu.  For symptoms: sinus congestion- start daily sinus rinses (neilmed Sinus Rinse); for sore throat use benzocaine throat lozenges or spray; for aches & fever take tylenol as directed; for cough, you may use a spoonful of honey thinned with lemon juice or hot tea, or benzonatate capsules as prescribed and codeine cough syrup for night time. Sip fluids every hour. Rest. If you are not feeling better in 1 week or develop fever or chest pain, call us for re-evaluation. Feel better!    Influenza A (H1N1) H1N1 formerly called "swine flu" is a new influenza virus causing sickness in people. The H1N1 virus is different from seasonal influenza viruses. However, the H1N1 symptoms are similar to seasonal influenza and it is spread from person to person. You may be at higher risk for serious problems if you have underlying serious medical conditions. The CDC and the [employer] are following reported cases around the world. CAUSES   The flu is thought to spread mainly person-to-person through coughing or sneezing of infected people.  A person may become infected by touching something with the virus on it and then touching their mouth or nose. SYMPTOMS   Fever.  Headache.  Tiredness.  Cough.  Sore throat.  Runny or stuffy nose.  Body aches.  Diarrhea and vomiting These symptoms are referred to as "flu-like symptoms." A lot of different illnesses, including the common cold, may have similar symptoms. DIAGNOSIS   There are tests that can tell if you have the H1N1 virus.  Confirmed cases of H1N1 will be reported to the state or local health department.  A doctor's exam may be needed to tell whether you have an infection that is a complication of the  flu. HOME CARE INSTRUCTIONS   Stay informed. Visit the [facility] website for current recommendations. Visit [employer]. You may also call 1-800-CDC-INFO (819-743-6349).  Get help early if you develop any of the above symptoms.  If you are at high risk from complications of the flu, talk to your caregiver as soon as you develop flu-like symptoms. Those at higher risk for complications include:  People 65 years or older.  People with chronic medical conditions.  Pregnant women.  Young children.  Your caregiver may recommend antiviral medicine to help treat the flu.  If you get the flu, get plenty of rest, drink enough water and fluids to keep your urine clear or pale yellow, and avoid using alcohol or tobacco.  You may take over-the-counter medicine to relieve the symptoms of the flu if your caregiver approves. (Never give aspirin to children or teenagers who have flu-like symptoms, particularly fever). TREATMENT  If you do get sick, antiviral drugs are available. These drugs can make your illness milder and make you feel better faster. Treatment should start soon after illness starts. It is only effective if taken within the first day of becoming ill. Only your caregiver can prescribe antiviral medication.  PREVENTION   Cover your nose and mouth with a tissue or your arm when you cough or sneeze. Throw the tissue away.  Wash your hands often with soap and warm water, especially after you cough or sneeze. Alcohol-based cleaners are also effective against germs.  Avoid touching your eyes, nose or mouth. This is one way germs spread.  Try to  avoid contact with sick people. Follow public health advice regarding school closures. Avoid crowds.  Stay home if you get sick. Limit contact with others to keep from infecting them. People infected with the H1N1 virus may be able to infect others anywhere from 1 day before feeling sick to 5-7 days after getting flu symptoms.  An H1N1  vaccine is available to help protect against the virus. In addition to the H1N1 vaccine, you will need to be vaccinated for seasonal influenza. The H1N1 and seasonal vaccines may be given on the same day. The CDC especially recommends the H1N1 vaccine for:  Pregnant women.  People who live with or care for children younger than 34 months of age.  Health care and emergency services personnel.  Persons between the ages of 46 months through 33 years of age.  People from ages 22 through 13 years who are at higher risk for H1N1 because of chronic health disorders or immune system problems. FACEMASKS In community and home settings, the use of facemasks and N95 respirators are not normally recommended. In certain circumstances, a facemask or N95 respirator may be used for persons at increased risk of severe illness from influenza. Your caregiver can give additional recommendations for facemask use. IN CHILDREN, EMERGENCY WARNING SIGNS THAT NEED URGENT MEDICAL CARE:  Fast breathing or trouble breathing.  Bluish skin color.  Not drinking enough fluids.  Not waking up or not interacting normally.  Being so fussy that the child does not want to be held.  Your child has an oral temperature above 102 F (38.9 C), not controlled by medicine.  Your baby is older than 3 months with a rectal temperature of 102 F (38.9 C) or higher.  Your baby is 20 months old or younger with a rectal temperature of 100.4 F (38 C) or higher.  Flu-like symptoms improve but then return with fever and worse cough. IN ADULTS, EMERGENCY WARNING SIGNS THAT NEED URGENT MEDICAL CARE:  Difficulty breathing or shortness of breath.  Pain or pressure in the chest or abdomen.  Sudden dizziness.  Confusion.  Severe or persistent vomiting.  Bluish color.  You have a oral temperature above 102 F (38.9 C), not controlled by medicine.  Flu-like symptoms improve but return with fever and worse cough. SEEK IMMEDIATE  MEDICAL CARE IF:  You or someone you know is experiencing any of the above symptoms. When you arrive at the emergency center, report that you think you have the flu. You may be asked to wear a mask and/or sit in a secluded area to protect others from getting sick. MAKE SURE YOU:   Understand these instructions.  Will watch your condition.  Will get help right away if you are not doing well or get worse. Some of this information courtesy of the CDC.  Document Released: 01/30/2008 Document Revised: 11/05/2011 Document Reviewed: 01/30/2008 [facility] Patient Information 2014 [place], [place].

## 2013-09-21 ENCOUNTER — Other Ambulatory Visit: Payer: Self-pay | Admitting: Family Medicine

## 2013-09-21 NOTE — Telephone Encounter (Signed)
Please advise refill? Last RX was done on 03-16-13 quantity 60 with 3 refills  If ok fax to [phone]

## 2013-09-22 ENCOUNTER — Telehealth: Payer: Self-pay | Admitting: Family Medicine

## 2013-09-22 NOTE — Telephone Encounter (Signed)
Please advise? Christina [name] denied stating pt needed an appt?

## 2013-09-22 NOTE — Telephone Encounter (Signed)
OK to refill Xanax, 30 day with 1 rf but needs to be seen before more ie every 6 months at minimum

## 2013-09-22 NOTE — Telephone Encounter (Signed)
PATIENT WENT TO PHARMACY FOR HER REFILL OF XANAX.  THEY SAID SHE DID NOT HAVE ANY AND WOULD NEED TO SEE BR B PRIOR TO GETTING THE MED.  PLEASE ADVISE

## 2013-09-23 MED ORDER — ALPRAZOLAM 0.5 MG PO TABS: 0.5000 mg | ORAL_TABLET | Freq: Two times a day (BID) | ORAL | Status: DC | PRN

## 2013-09-23 NOTE — Telephone Encounter (Signed)
Rx request to pharmacy/[facility]  

## 2013-10-04 ENCOUNTER — Other Ambulatory Visit: Payer: Self-pay | Admitting: Family Medicine

## 2013-10-05 ENCOUNTER — Telehealth: Payer: Self-pay | Admitting: Family Medicine

## 2013-10-05 NOTE — Telephone Encounter (Signed)
PA form faxed over on Omeprazole DR 40 Mg cap, form forward to nurse

## 2013-10-07 NOTE — Telephone Encounter (Signed)
PA form faxed to [facility].

## 2013-10-08 NOTE — Telephone Encounter (Signed)
Received another PA form for Omeprazole, forward to nurse

## 2013-10-14 NOTE — Telephone Encounter (Signed)
Omeprazole has been approved from 09-18-13 through 10-09-14

## 2013-10-19 IMAGING — MG MM DIGITAL SCREENING BILAT
5 series · 5 of 5 positions shown · non-contrast
Comparison: Previous exams.

CLINICAL DATA: Screening.

DIGITAL BILATERAL SCREENING MAMMOGRAM WITH CAD

[R CC]
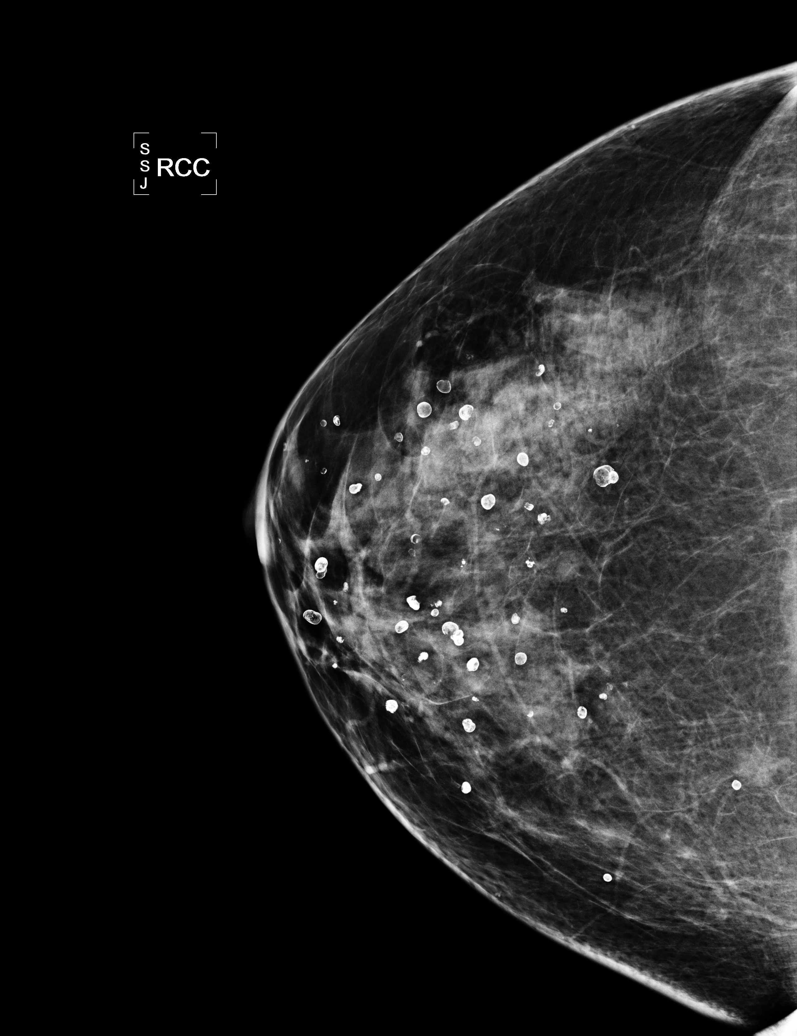

[L CC]
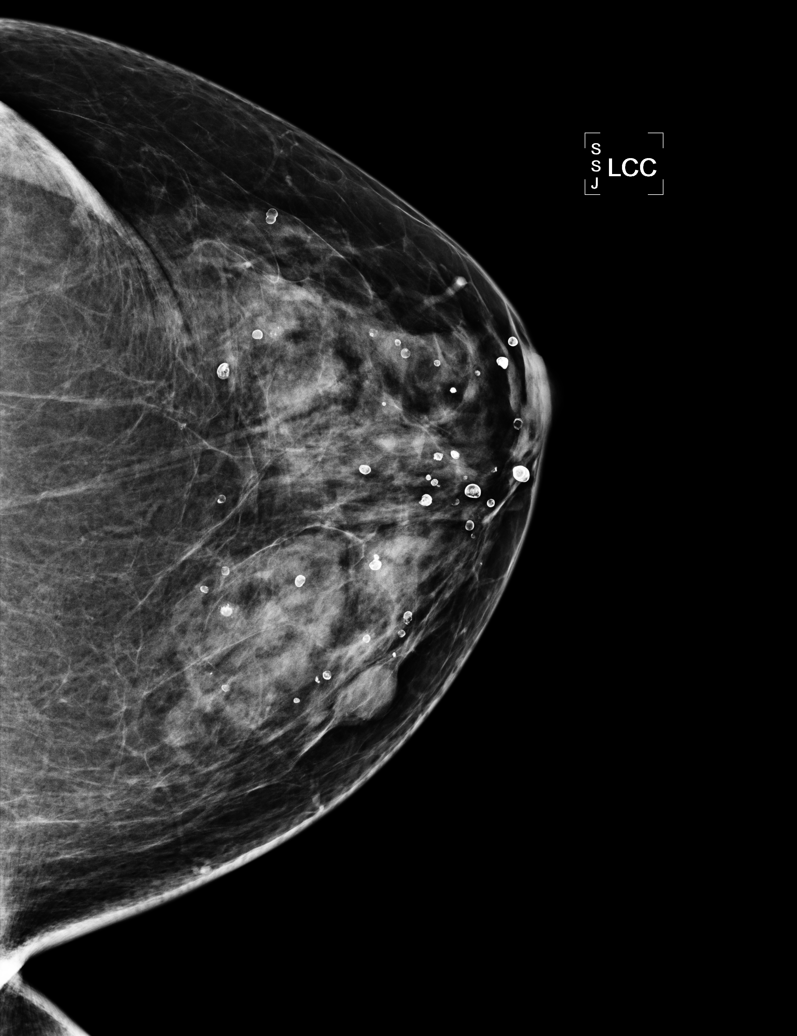

[L MLO]
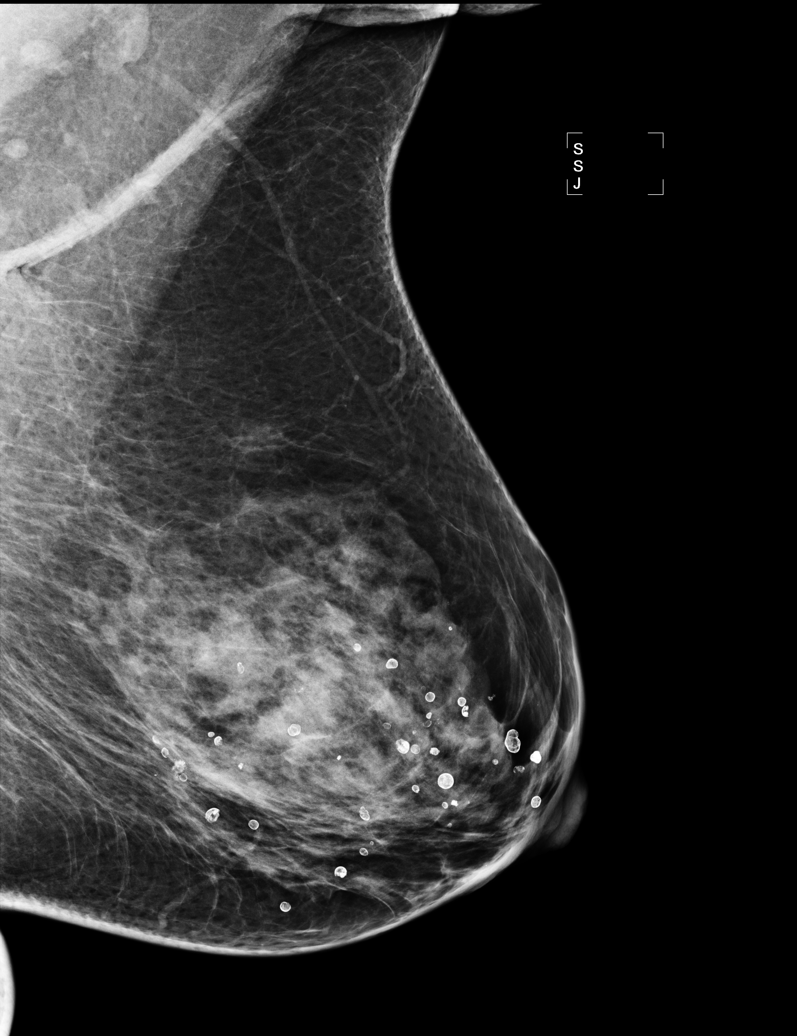

[R MLO]
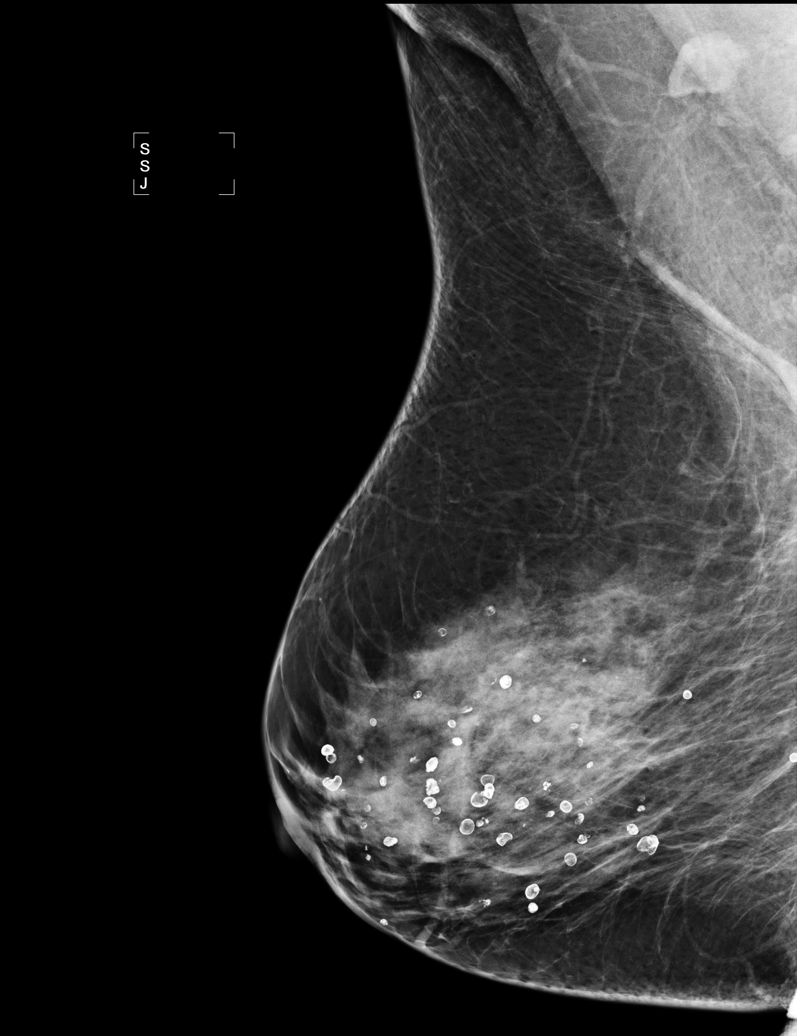

[R CV]
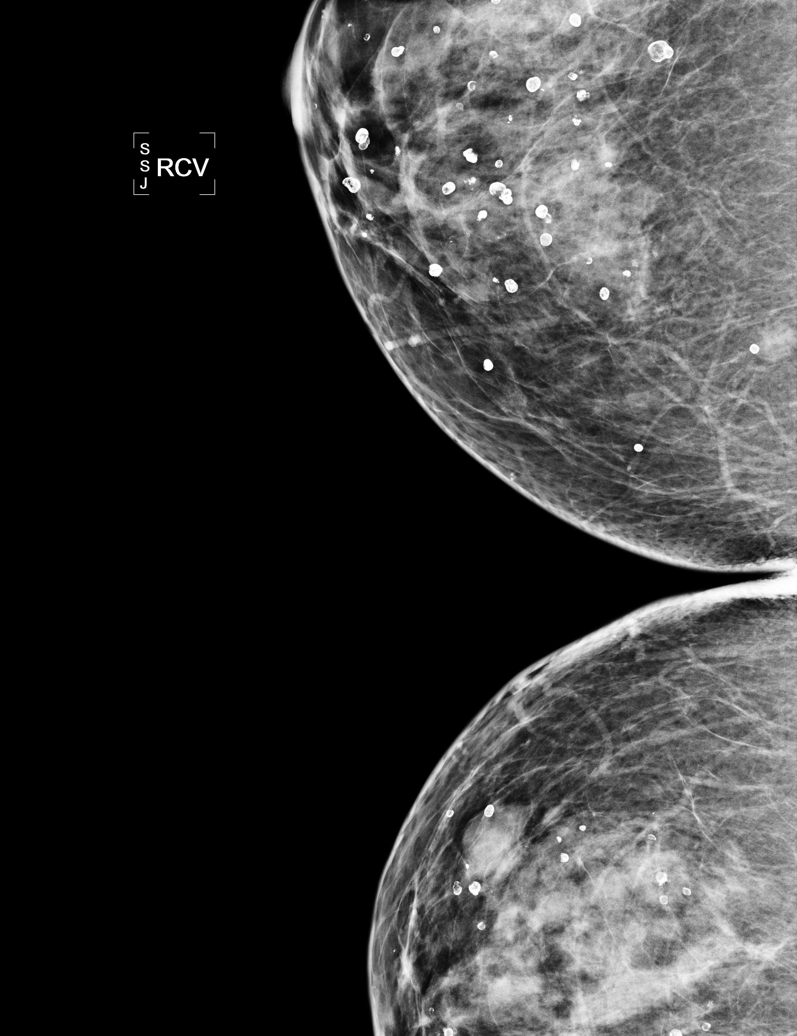

[5 of 5 positions shown; findings below may reference images not displayed]

FINDINGS: The breast tissue is heterogeneously dense. No
suspicious masses, architectural distortion, or calcifications are
present.

Images were processed with CAD.
IMPRESSION: No mammographic evidence of malignancy.

A result letter of this screening mammogram will be mailed directly
to the patient.

RECOMMENDATION:
Screening mammogram in one year. (Code:PX-P-87Q)

BI-RADS CATEGORY 1:  Negative.

## 2013-10-27 ENCOUNTER — Ambulatory Visit: Payer: Medicare Other | Admitting: Family Medicine

## 2013-11-10 ENCOUNTER — Ambulatory Visit: Payer: MEDICARE | Admitting: Family Medicine

## 2013-11-24 ENCOUNTER — Other Ambulatory Visit: Payer: Self-pay | Admitting: Family Medicine

## 2013-12-21 ENCOUNTER — Telehealth: Payer: Self-pay

## 2013-12-21 NOTE — Telephone Encounter (Signed)
Please advise pt that we sent in 30 day supply of omeprazole but she needs to schedule an appt. MD wanted patient to return 11-23-13

## 2013-12-24 NOTE — Telephone Encounter (Signed)
Informed patient of medication refill and she scheduled appointment for 01/19/14

## 2014-01-19 ENCOUNTER — Encounter: Payer: Self-pay | Admitting: Family Medicine

## 2014-01-19 ENCOUNTER — Ambulatory Visit (INDEPENDENT_AMBULATORY_CARE_PROVIDER_SITE_OTHER): Payer: Medicare Other | Admitting: Family Medicine

## 2014-01-19 VITALS — BP 118/68 | HR 71 | Temp 97.9°F | Ht 65.0 in | Wt 191.0 lb

## 2014-01-19 DIAGNOSIS — K449 Diaphragmatic hernia without obstruction or gangrene: Secondary | ICD-10-CM

## 2014-01-19 DIAGNOSIS — F418 Other specified anxiety disorders: Secondary | ICD-10-CM

## 2014-01-19 DIAGNOSIS — G4700 Insomnia, unspecified: Secondary | ICD-10-CM

## 2014-01-19 DIAGNOSIS — F341 Dysthymic disorder: Secondary | ICD-10-CM

## 2014-01-19 DIAGNOSIS — I10 Essential (primary) hypertension: Secondary | ICD-10-CM

## 2014-01-19 DIAGNOSIS — E669 Obesity, unspecified: Secondary | ICD-10-CM

## 2014-01-19 DIAGNOSIS — K219 Gastro-esophageal reflux disease without esophagitis: Secondary | ICD-10-CM

## 2014-01-19 MED ORDER — OMEPRAZOLE 40 MG PO CPDR: 40.0000 mg | DELAYED_RELEASE_CAPSULE | Freq: Every day | ORAL | Status: DC

## 2014-01-19 MED ORDER — ALPRAZOLAM 0.5 MG PO TABS: 0.5000 mg | ORAL_TABLET | Freq: Two times a day (BID) | ORAL | Status: DC | PRN

## 2014-01-19 MED ORDER — TEMAZEPAM 15 MG PO CAPS: 15.0000 mg | ORAL_CAPSULE | Freq: Every evening | ORAL | Status: DC | PRN

## 2014-01-19 NOTE — Progress Notes (Signed)
Pre visit review using our clinic review tool, if applicable. No additional management support is needed unless otherwise documented below in the visit note. 

## 2014-01-19 NOTE — Patient Instructions (Signed)

## 2014-01-24 ENCOUNTER — Encounter: Payer: Self-pay | Admitting: Family Medicine

## 2014-01-24 NOTE — Assessment & Plan Note (Signed)
Has had increased family stressors this past month and thus increased insomnia. Will try Restoril since Ambien gave her nightmares. Warned regarding side effects.

## 2014-01-24 NOTE — Assessment & Plan Note (Signed)
Increased stressors with family this week, may continue Alprazolam prn but aware not to take with Restoril. Consider daily med if worsens or persists

## 2014-01-24 NOTE — Assessment & Plan Note (Signed)
Well controlled, no changes to meds. Encouraged heart healthy diet such as the DASH diet and exercise as tolerated.  °

## 2014-01-24 NOTE — Progress Notes (Signed)
Patient ID: Christina [name], female   DOB: [birth year], 73 y.o.   MRN: [id number] Christina [name] [id number] 02-19-1941 01/24/2014      Progress Note-Follow Up  Subjective  Chief Complaint  Chief Complaint  Patient presents with  . Medication Refill    HPI  Patient is a 73 year old female in today for routine medical care. She is struggling with increased stressors. Having a lot of difficulties with her family. Acknowledges a sore throat difficulty with sleeping as well as increased anxiety and depression. No recent illness. Denies CP/palp/SOB/HA/congestion/fevers/GI or GU c/o. Taking meds as prescribed  Past Medical History  Diagnosis Date  . Vertigo   . Chicken pox as a child  . Measles as a child  . Mumps as a child  . Asthma     a little?  [name] Hypertension   . Urinary frequency 09/28/2011  . Cervical cancer screening 01/31/2012  . Dysphagia 01/31/2012  . Routine health maintenance 01/31/2012  . Sinusitis, acute 07/10/2012  . Hiatal hernia with gastroesophageal reflux 10/26/2010    Qualifier: Diagnosis of  By: [name] MD, [name]    . Dermatitis of external ear 07/11/2012  . Neck pain on left side 06/21/2013  . Hyperglycemia 06/21/2013  . Depression with anxiety 06/07/2009    Qualifier: Diagnosis of  By: [name] MD, [name]      Past Surgical History  Procedure Laterality Date  . Breast biopsy  1960, 70's  . Tubal ligation  1970  . Cystectomy      abdomen    Family History  Problem Relation Age of Onset  . Cancer Mother 4    lung- smoker, ovarian  . Alcohol abuse Father   . Cancer Sister     ovarian  . Diabetes Sister   . Hypertension Sister   . Other Maternal Grandmother     pacemaker  . Multiple sclerosis Maternal Grandfather     ?    History   Social History  . Marital Status: Widowed    Spouse Name: N/A    Number of Children: N/A  . Years of Education: N/A   Occupational History  . Not on file.   Social History Main Topics  . Smoking  status: Former Smoker -- 1.00 packs/day for 30 years    Types: Cigarettes    Quit date: 08/27/1990  . Smokeless tobacco: Never Used  . Alcohol Use: No  . Drug Use: No  . Sexual Activity: Not on file   Other Topics Concern  . Not on file   Social History Narrative  . No narrative on file    Current Outpatient Prescriptions on File Prior to Visit  Medication Sig Dispense Refill  . Calcium Carbonate (CALCIUM 600 PO) Take by mouth.        . Cholecalciferol (VITAMIN D) 1000 UNITS capsule Take 1,000 Units by mouth daily.        . fish oil-omega-3 fatty acids 1000 MG capsule Take 1 g by mouth daily.        [name] losartan (COZAAR) 50 MG tablet TAKE 1 TABLET BY MOUTH EVERY DAY  90 tablet  0   No current facility-administered medications on file prior to visit.    Allergies  Allergen Reactions  . Hydrochlorothiazide     REACTION: cough  . Lisinopril     REACTION: cough    Review of Systems  Review of Systems  Constitutional: Negative for fever and malaise/fatigue.  HENT: Negative for congestion.   Eyes:  Negative for discharge.  Respiratory: Negative for shortness of breath.   Cardiovascular: Negative for chest pain, palpitations and leg swelling.  Gastrointestinal: Negative for nausea, abdominal pain and diarrhea.  Genitourinary: Negative for dysuria.  Musculoskeletal: Negative for falls.  Skin: Negative for rash.  Neurological: Negative for loss of consciousness and headaches.  Endo/Heme/Allergies: Negative for polydipsia.  Psychiatric/Behavioral: Positive for depression. Negative for suicidal ideas. The patient is nervous/anxious and has insomnia.     Objective  BP 118/68  Pulse 71  Temp(Src) 97.9 F (36.6 C) (Oral)  Ht 5\' 5"  (1.651 m)  Wt 191 lb 0.6 oz (86.655 kg)  BMI 31.79 kg/m2  SpO2 96%  Physical Exam  Physical Exam  Constitutional: She is oriented to person, place, and time and well-developed, well-nourished, and in no distress. No distress.  HENT:  Head:  Normocephalic and atraumatic.  Eyes: Conjunctivae are normal.  Neck: Neck supple. No thyromegaly present.  Cardiovascular: Normal rate, regular rhythm and normal heart sounds.   No murmur heard. Pulmonary/Chest: Effort normal and breath sounds normal. She has no wheezes.  Abdominal: She exhibits no distension and no mass.  Musculoskeletal: She exhibits no edema.  Lymphadenopathy:    She has no cervical adenopathy.  Neurological: She is alert and oriented to person, place, and time.  Skin: Skin is warm and dry. No rash noted. She is not diaphoretic.  Psychiatric: Memory, affect and judgment normal.    Lab Results  Component Value Date   TSH 2.085 05/26/2013   Lab Results  Component Value Date   WBC 5.1 05/26/2013   HGB 14.9 05/26/2013   HCT 43.5 05/26/2013   MCV 93.5 05/26/2013   PLT 263 05/26/2013   Lab Results  Component Value Date   CREATININE 0.85 05/26/2013   BUN 16 05/26/2013   NA 141 05/26/2013   K 4.8 05/26/2013   CL 106 05/26/2013   CO2 31 05/26/2013   Lab Results  Component Value Date   ALT 14 05/26/2013   AST 18 05/26/2013   ALKPHOS 65 05/26/2013   BILITOT 0.8 05/26/2013   Lab Results  Component Value Date   CHOL 201* 05/26/2013   Lab Results  Component Value Date   HDL 60 05/26/2013   Lab Results  Component Value Date   LDLCALC 126* 05/26/2013   Lab Results  Component Value Date   TRIG 77 05/26/2013   Lab Results  Component Value Date   CHOLHDL 3.4 05/26/2013     Assessment & Plan  Insomnia Has had increased family stressors this past month and thus increased insomnia. Will try Restoril since Ambien gave her nightmares. Warned regarding side effects.   Essential hypertension, benign Well controlled, no changes to meds. Encouraged heart healthy diet such as the DASH diet and exercise as tolerated.   OBESITY Encouraged DASH diet, decrease po intake and increase exercise as tolerated. Needs 7-8 hours of sleep nightly. Avoid trans fats, eat small, frequent  meals every 4-5 hours with lean proteins, complex carbs and healthy fats. Minimize simple carbs, GMO foods.  Depression with anxiety Increased stressors with family this week, may continue Alprazolam prn but aware not to take with Restoril. Consider daily med if worsens or persists  Hiatal hernia with gastroesophageal reflux Avoid offending foods, start probiotics. Do not eat large meals in late evening and consider raising head of bed.

## 2014-01-24 NOTE — Assessment & Plan Note (Signed)
Encouraged DASH diet, decrease po intake and increase exercise as tolerated. Needs 7-8 hours of sleep nightly. Avoid trans fats, eat small, frequent meals every 4-5 hours with lean proteins, complex carbs and healthy fats. Minimize simple carbs, GMO foods. 

## 2014-01-24 NOTE — Assessment & Plan Note (Signed)
Avoid offending foods, start probiotics. Do not eat large meals in late evening and consider raising head of bed.  

## 2014-01-27 ENCOUNTER — Telehealth: Payer: Self-pay | Admitting: *Deleted

## 2014-01-27 NOTE — Telephone Encounter (Signed)
Pt called stating she discussed starting zoloft at her last visit but wanted to wait to see if things improved. Pt states she would like to go ahead and start medication. Please advise.

## 2014-01-27 NOTE — Telephone Encounter (Signed)
Start Sertraline 50 mg tab 1/2 tab po daily x 7 days, then increase to 1 tab po daily. Disp #30 then 2 rf. Have her come in in 8 to 12 weeks

## 2014-01-28 MED ORDER — SERTRALINE HCL 50 MG PO TABS: 50.0000 mg | ORAL_TABLET | ORAL | Status: DC

## 2014-01-28 NOTE — Telephone Encounter (Signed)
Pt states that she is going to a church to get this lifeline screening. Pt states this isn't covered by insurance and the insurance company told her that she can get something (doesn't remember what they told her) from her MD and that would help pay for it.   I informed patient that she needs to get ahold of the insurance company to find out what she needs from md.  Pt stated she will do it another time and the appts not until August.

## 2014-01-28 NOTE — Telephone Encounter (Signed)
RX sent and please inform pt and schedule appt

## 2014-01-28 NOTE — Telephone Encounter (Signed)
Informed patient that rx has been sent and she already has appointment scheduled for august.   She states that she is doing a lifeline screening through her insurance and would like a nurse to call her back on this.

## 2014-02-22 ENCOUNTER — Other Ambulatory Visit: Payer: Self-pay | Admitting: Family Medicine

## 2014-04-19 ENCOUNTER — Ambulatory Visit: Payer: Medicare Other | Admitting: Family Medicine

## 2014-04-23 ENCOUNTER — Telehealth: Payer: Self-pay

## 2014-04-23 NOTE — Telephone Encounter (Signed)
Pt stated that "she had an appt on 8/24 but couldn't keep it. Her next appt is 9/21. Her sertraline runs out next week and pt needs rx until next appt." is it ok to fill? LDM

## 2014-04-23 NOTE — Telephone Encounter (Signed)
OK to send in a one month supply of her Sertraline til seen

## 2014-04-26 MED ORDER — SERTRALINE HCL 50 MG PO TABS: 50.0000 mg | ORAL_TABLET | Freq: Every day | ORAL | Status: DC

## 2014-05-17 ENCOUNTER — Ambulatory Visit: Payer: Medicare Other | Admitting: Family Medicine

## 2014-05-23 ENCOUNTER — Other Ambulatory Visit: Payer: Self-pay | Admitting: Family Medicine

## 2014-05-24 NOTE — Telephone Encounter (Signed)
RX sent but pt needs a follow up with md

## 2014-05-24 NOTE — Telephone Encounter (Signed)
Appointment scheduled for 05/31/14

## 2014-05-28 ENCOUNTER — Other Ambulatory Visit: Payer: Self-pay | Admitting: Family Medicine

## 2014-05-28 DIAGNOSIS — Z139 Encounter for screening, unspecified: Secondary | ICD-10-CM

## 2014-05-31 ENCOUNTER — Encounter: Payer: Self-pay | Admitting: Family Medicine

## 2014-05-31 ENCOUNTER — Ambulatory Visit (INDEPENDENT_AMBULATORY_CARE_PROVIDER_SITE_OTHER): Payer: Medicare Other | Admitting: Family Medicine

## 2014-05-31 VITALS — BP 143/71 | HR 68 | Temp 98.0°F | Ht 65.0 in | Wt 183.8 lb

## 2014-05-31 DIAGNOSIS — Z23 Encounter for immunization: Secondary | ICD-10-CM

## 2014-05-31 DIAGNOSIS — E669 Obesity, unspecified: Secondary | ICD-10-CM

## 2014-05-31 DIAGNOSIS — F418 Other specified anxiety disorders: Secondary | ICD-10-CM

## 2014-05-31 DIAGNOSIS — M542 Cervicalgia: Secondary | ICD-10-CM

## 2014-05-31 DIAGNOSIS — I10 Essential (primary) hypertension: Secondary | ICD-10-CM

## 2014-05-31 DIAGNOSIS — R739 Hyperglycemia, unspecified: Secondary | ICD-10-CM

## 2014-05-31 DIAGNOSIS — R1310 Dysphagia, unspecified: Secondary | ICD-10-CM

## 2014-05-31 MED ORDER — SERTRALINE HCL 50 MG PO TABS: 75.0000 mg | ORAL_TABLET | Freq: Every day | ORAL | Status: DC

## 2014-05-31 MED ORDER — ALPRAZOLAM 0.5 MG PO TABS: 0.5000 mg | ORAL_TABLET | Freq: Every evening | ORAL | Status: DC | PRN

## 2014-05-31 NOTE — Patient Instructions (Addendum)
Probiotic daily such as Digestive Advantage or Phillip's Colon Health daily    After 1 month can try to drop the Omeprazole/Prilosec to every   64 oz of fluids daily, increase if drinking   Gastroesophageal Reflux Disease, Adult Gastroesophageal reflux disease (GERD) happens when acid from your stomach flows up into the esophagus. When acid comes in contact with the esophagus, the acid causes soreness (inflammation) in the esophagus. Over time, GERD may create small holes (ulcers) in the lining of the esophagus. CAUSES   Increased body weight. This puts pressure on the stomach, making acid rise from the stomach into the esophagus.  Smoking. This increases acid production in the stomach.  Drinking alcohol. This causes decreased pressure in the lower esophageal sphincter (valve or ring of muscle between the esophagus and stomach), allowing acid from the stomach into the esophagus.  Late evening meals and a full stomach. This increases pressure and acid production in the stomach.  A malformed lower esophageal sphincter. Sometimes, no cause is found. SYMPTOMS   Burning pain in the lower part of the mid-chest behind the breastbone and in the mid-stomach area. This may occur twice a week or more often.  Trouble swallowing.  Sore throat.  Dry cough.  Asthma-like symptoms including chest tightness, shortness of breath, or wheezing. DIAGNOSIS  Your caregiver may be able to diagnose GERD based on your symptoms. In some cases, X-rays and other tests may be done to check for complications or to check the condition of your stomach and esophagus. TREATMENT  Your caregiver may recommend over-the-counter or prescription medicines to help decrease acid production. Ask your caregiver before starting or adding any new medicines.  HOME CARE INSTRUCTIONS   Change the factors that you can control. Ask your caregiver for guidance concerning weight loss, quitting smoking, and alcohol  consumption.  Avoid foods and drinks that make your symptoms worse, such as:  Caffeine or alcoholic drinks.  Chocolate.  Peppermint or mint flavorings.  Garlic and onions.  Spicy foods.  Citrus fruits, such as oranges, lemons, or limes.  Tomato-based foods such as sauce, chili, salsa, and pizza.  Fried and fatty foods.  Avoid lying down for the 3 hours prior to your bedtime or prior to taking a nap.  Eat small, frequent meals instead of large meals.  Wear loose-fitting clothing. Do not wear anything tight around your waist that causes pressure on your stomach.  Raise the head of your bed 6 to 8 inches with wood blocks to help you sleep. Extra pillows will not help.  Only take over-the-counter or prescription medicines for pain, discomfort, or fever as directed by your caregiver.  Do not take aspirin, ibuprofen, or other nonsteroidal anti-inflammatory drugs (NSAIDs). SEEK IMMEDIATE MEDICAL CARE IF:   You have pain in your arms, neck, jaw, teeth, or back.  Your pain increases or changes in intensity or duration.  You develop nausea, vomiting, or sweating (diaphoresis).  You develop shortness of breath, or you faint.  Your vomit is green, yellow, black, or looks like coffee grounds or blood.  Your stool is red, bloody, or black. These symptoms could be signs of other problems, such as heart disease, gastric bleeding, or esophageal bleeding. MAKE SURE YOU:   Understand these instructions.  Will watch your condition.  Will get help right away if you are not doing well or get worse. Document Released: 05/23/2005 Document Revised: 11/05/2011 Document Reviewed: 03/02/2011 [facility] Patient Information 2015 [place], [place]. This information is not intended to replace advice  given to you by your health care provider. Make sure you discuss any questions you have with your health care provider. other day if no increase in symptoms drop to every 3rd day if no symptoms can use  as needed

## 2014-05-31 NOTE — Progress Notes (Signed)
Pre visit review using our clinic review tool, if applicable. No additional management support is needed unless otherwise documented below in the visit note. 

## 2014-06-01 LAB — RENAL FUNCTION PANEL
Albumin: 4.2 g/dL (ref 3.5–5.2)
BUN: 14 mg/dL (ref 6–23)
CO2: 25 mEq/L (ref 19–32)
Calcium: 10.3 mg/dL (ref 8.4–10.5)
Chloride: 104 mEq/L (ref 96–112)
Creatinine, Ser: 0.8 mg/dL (ref 0.4–1.2)
GFR: 77.07 mL/min (ref >60.00)
Glucose, Bld: 85 mg/dL (ref 70–99)
Phosphorus: 3.2 mg/dL (ref 2.3–4.6)
Potassium: 4.2 mEq/L (ref 3.5–5.1)
Sodium: 140 mEq/L (ref 135–145)

## 2014-06-01 LAB — TSH: TSH: 1.24 u[IU]/mL (ref 0.35–4.50)

## 2014-06-01 LAB — CBC
HCT: 42.5 % (ref 36.0–46.0)
Hemoglobin: 13.8 g/dL (ref 12.0–15.0)
MCHC: 32.6 g/dL (ref 30.0–36.0)
MCV: 96 fl (ref 78.0–100.0)
Platelets: 242 10*3/uL (ref 150.0–400.0)
RBC: 4.43 Mil/uL (ref 3.87–5.11)
RDW: 14 % (ref 11.5–15.5)
WBC: 7 10*3/uL (ref 4.0–10.5)

## 2014-06-01 LAB — HEMOGLOBIN A1C: Hgb A1c MFr Bld: 5.9 % (ref 4.6–6.5)

## 2014-06-01 LAB — HEPATIC FUNCTION PANEL
ALT: 28 U/L (ref 0–35)
AST: 32 U/L (ref 0–37)
Albumin: 4.2 g/dL (ref 3.5–5.2)
Alkaline Phosphatase: 54 U/L (ref 39–117)
Bilirubin, Direct: 0.1 mg/dL (ref 0.0–0.3)
Total Bilirubin: 0.7 mg/dL (ref 0.2–1.2)
Total Protein: 7.3 g/dL (ref 6.0–8.3)

## 2014-06-01 LAB — LIPID PANEL
Cholesterol: 237 mg/dL — ABNORMAL HIGH (ref 0–200)
HDL: 55.4 mg/dL (ref >39.00)
LDL Cholesterol: 161 mg/dL — ABNORMAL HIGH (ref 0–99)
NonHDL: 181.6
Total CHOL/HDL Ratio: 4
Triglycerides: 102 mg/dL (ref 0.0–149.0)
VLDL: 20.4 mg/dL (ref 0.0–40.0)

## 2014-06-06 NOTE — Assessment & Plan Note (Signed)
hgba1c acceptable, minimize simple carbs. Increase exercise as tolerated.  

## 2014-06-06 NOTE — Assessment & Plan Note (Signed)
Uses Alprazolam prn sparingly, may continue the same, encouraged minimal use

## 2014-06-06 NOTE — Assessment & Plan Note (Signed)
Notes occasional cough most in am. Avoid offending foods, start probiotics. Do not eat large meals in late evening and consider raising head of bed. May need referral if symptoms persist.

## 2014-06-06 NOTE — Progress Notes (Signed)
Patient ID: Christina [name], female   DOB: April 18, 1941, 73 y.o.   MRN: [id number] Christina [name] [id number] 22-Jun-1941 06/06/2014      Progress Note-Follow Up  Subjective  Chief Complaint  Chief Complaint  Patient presents with  . Follow-up    3 month  . Injections    prevnar and flu    HPI  Patient is a 73 year old female in today for routine medical care. Fairly well but does acknowledge some increasing neck pain recently. Denies any trauma or radicular symptoms. Has headaches and a cough intermittently in the morning. Denies any witnessed apnea. Denies CP/palp/SOB/HA/congestion/fevers/GI or GU c/o. Taking meds as prescribed  Past Medical History  Diagnosis Date  . Vertigo   . Chicken pox as a child  . Measles as a child  . Mumps as a child  . Asthma     a little?  [name] Hypertension   . Urinary frequency 09/28/2011  . Cervical cancer screening 01/31/2012  . Dysphagia 01/31/2012  . Routine health maintenance 01/31/2012  . Sinusitis, acute 07/10/2012  . Hiatal hernia with gastroesophageal reflux 10/26/2010    Qualifier: Diagnosis of  By: [name] MD, [name]    . Dermatitis of external ear 07/11/2012  . Neck pain on left side 06/21/2013  . Hyperglycemia 06/21/2013  . Depression with anxiety 06/07/2009    Qualifier: Diagnosis of  By: [name] MD, [name]      Past Surgical History  Procedure Laterality Date  . Breast biopsy  1960, 70's  . Tubal ligation  1970  . Cystectomy      abdomen    Family History  Problem Relation Age of Onset  . Cancer Mother 41    lung- smoker, ovarian  . Alcohol abuse Father   . Cancer Sister     ovarian  . Diabetes Sister   . Hypertension Sister   . Other Maternal Grandmother     pacemaker  . Multiple sclerosis Maternal Grandfather     ?    History   Social History  . Marital Status: Widowed    Spouse Name: N/A    Number of Children: N/A  . Years of Education: N/A   Occupational History  . Not on file.   Social  History Main Topics  . Smoking status: Former Smoker -- 1.00 packs/day for 30 years    Types: Cigarettes    Quit date: 08/27/1990  . Smokeless tobacco: Never Used  . Alcohol Use: No  . Drug Use: No  . Sexual Activity: Not on file   Other Topics Concern  . Not on file   Social History Narrative  . No narrative on file    Current Outpatient Prescriptions on File Prior to Visit  Medication Sig Dispense Refill  . Calcium Carbonate (CALCIUM 600 PO) Take by mouth.        . Cholecalciferol (VITAMIN D) 1000 UNITS capsule Take 1,000 Units by mouth daily.        . fish oil-omega-3 fatty acids 1000 MG capsule Take 1 g by mouth daily.        [name] losartan (COZAAR) 50 MG tablet TAKE 1 TABLET BY MOUTH EVERY DAY  90 tablet  1  . omeprazole (PRILOSEC) 40 MG capsule Take 1 capsule (40 mg total) by mouth daily.  90 capsule  1   No current facility-administered medications on file prior to visit.    Allergies  Allergen Reactions  . Hydrochlorothiazide     REACTION: cough  .  Lisinopril     REACTION: cough    Review of Systems  Review of Systems  Constitutional: Positive for malaise/fatigue. Negative for fever.  HENT: Negative for congestion.   Eyes: Negative for discharge.  Respiratory: Positive for cough. Negative for shortness of breath.   Cardiovascular: Negative for chest pain, palpitations and leg swelling.  Gastrointestinal: Negative for nausea, abdominal pain and diarrhea.  Genitourinary: Negative for dysuria.  Musculoskeletal: Positive for neck pain. Negative for falls.  Skin: Negative for rash.  Neurological: Positive for headaches. Negative for loss of consciousness.  Endo/Heme/Allergies: Negative for polydipsia.  Psychiatric/Behavioral: Negative for depression and suicidal ideas. The patient is not nervous/anxious and does not have insomnia.     Objective  BP 143/71  Pulse 68  Temp(Src) 98 F (36.7 C) (Oral)  Ht 5\' 5"  (1.651 m)  Wt 183 lb 12.8 oz (83.371 kg)  BMI 30.59  kg/m2  SpO2 98%  Physical Exam  Physical Exam  Constitutional: She is oriented to person, place, and time and well-developed, well-nourished, and in no distress. No distress.  HENT:  Head: Normocephalic and atraumatic.  Eyes: Conjunctivae are normal.  Neck: Neck supple. No thyromegaly present.  Cardiovascular: Normal rate, regular rhythm and normal heart sounds.   No murmur heard. Pulmonary/Chest: Effort normal and breath sounds normal. She has no wheezes.  Abdominal: She exhibits no distension and no mass.  Musculoskeletal: She exhibits no edema.  Lymphadenopathy:    She has no cervical adenopathy.  Neurological: She is alert and oriented to person, place, and time.  Skin: Skin is warm and dry. No rash noted. She is not diaphoretic.  Psychiatric: Memory, affect and judgment normal.    Lab Results  Component Value Date   TSH 1.24 05/31/2014   Lab Results  Component Value Date   WBC 7.0 05/31/2014   HGB 13.8 05/31/2014   HCT 42.5 05/31/2014   MCV 96.0 05/31/2014   PLT 242.0 05/31/2014   Lab Results  Component Value Date   CREATININE 0.8 05/31/2014   BUN 14 05/31/2014   NA 140 05/31/2014   K 4.2 05/31/2014   CL 104 05/31/2014   CO2 25 05/31/2014   Lab Results  Component Value Date   ALT 28 05/31/2014   AST 32 05/31/2014   ALKPHOS 54 05/31/2014   BILITOT 0.7 05/31/2014   Lab Results  Component Value Date   CHOL 237* 05/31/2014   Lab Results  Component Value Date   HDL 55.40 05/31/2014   Lab Results  Component Value Date   LDLCALC 161* 05/31/2014   Lab Results  Component Value Date   TRIG 102.0 05/31/2014   Lab Results  Component Value Date   CHOLHDL 4 05/31/2014     Assessment & Plan  Obesity Encouraged DASH diet, decrease po intake and increase exercise as tolerated. Needs 7-8 hours of sleep nightly. Avoid trans fats, eat small, frequent meals every 4-5 hours with lean proteins, complex carbs and healthy fats. Minimize simple carbs, GMO foods.  Essential  hypertension, benign Well controlled, no changes to meds. Encouraged heart healthy diet such as the DASH diet and exercise as tolerated.   Hyperglycemia hgba1c acceptable, minimize simple carbs. Increase exercise as tolerated.  Neck pain on left side Encouraged moist heat and gentle stretching as tolerated. May try NSAIDs and prescription meds as directed and report if symptoms worsen or seek immediate care  Depression with anxiety Uses Alprazolam prn sparingly, may continue the same, encouraged minimal use  Dysphagia Notes occasional cough  most in am. Avoid offending foods, start probiotics. Do not eat large meals in late evening and consider raising head of bed. May need referral if symptoms persist.

## 2014-06-06 NOTE — Assessment & Plan Note (Signed)
Well controlled, no changes to meds. Encouraged heart healthy diet such as the DASH diet and exercise as tolerated.  °

## 2014-06-06 NOTE — Assessment & Plan Note (Signed)
Encouraged moist heat and gentle stretching as tolerated. May try NSAIDs and prescription meds as directed and report if symptoms worsen or seek immediate care 

## 2014-06-06 NOTE — Assessment & Plan Note (Signed)
Encouraged DASH diet, decrease po intake and increase exercise as tolerated. Needs 7-8 hours of sleep nightly. Avoid trans fats, eat small, frequent meals every 4-5 hours with lean proteins, complex carbs and healthy fats. Minimize simple carbs, GMO foods. 

## 2014-07-01 ENCOUNTER — Ambulatory Visit (INDEPENDENT_AMBULATORY_CARE_PROVIDER_SITE_OTHER): Payer: Medicare Other

## 2014-07-01 DIAGNOSIS — Z139 Encounter for screening, unspecified: Secondary | ICD-10-CM

## 2014-07-01 DIAGNOSIS — Z1231 Encounter for screening mammogram for malignant neoplasm of breast: Secondary | ICD-10-CM

## 2014-08-19 ENCOUNTER — Other Ambulatory Visit: Payer: Self-pay | Admitting: Family Medicine

## 2014-08-19 NOTE — Telephone Encounter (Signed)
Last filled:  02/23/14 Amt: 90, 1 refill Last OV: 05/31/14 Next appt:  10/22/14   Med filled.

## 2014-09-01 ENCOUNTER — Ambulatory Visit (INDEPENDENT_AMBULATORY_CARE_PROVIDER_SITE_OTHER): Payer: Medicare Other | Admitting: Physician Assistant

## 2014-09-01 ENCOUNTER — Ambulatory Visit (HOSPITAL_BASED_OUTPATIENT_CLINIC_OR_DEPARTMENT_OTHER)
Admission: RE | Admit: 2014-09-01 | Discharge: 2014-09-01 | Disposition: A | Discharge status: Home / Self Care | Payer: Medicare Other | Source: Ambulatory Visit | Attending: Physician Assistant | Admitting: Physician Assistant

## 2014-09-01 ENCOUNTER — Encounter: Payer: Self-pay | Admitting: Physician Assistant

## 2014-09-01 VITALS — BP 181/74 | HR 72 | Temp 97.7°F | Resp 16 | Ht 65.0 in | Wt 182.5 lb

## 2014-09-01 DIAGNOSIS — R053 Chronic cough: Secondary | ICD-10-CM

## 2014-09-01 DIAGNOSIS — R079 Chest pain, unspecified: Secondary | ICD-10-CM | POA: Insufficient documentation

## 2014-09-01 DIAGNOSIS — R05 Cough: Secondary | ICD-10-CM | POA: Diagnosis present

## 2014-09-01 MED ORDER — BENZONATATE 100 MG PO CAPS: 100.0000 mg | ORAL_CAPSULE | Freq: Two times a day (BID) | ORAL | Status: DC | PRN

## 2014-09-01 NOTE — Progress Notes (Signed)
Pre visit review using our clinic review tool, if applicable. No additional management support is needed unless otherwise documented below in the visit note/SLS  

## 2014-09-01 NOTE — Patient Instructions (Signed)
Please take your Flonase daily.  Also please take the Prilosec daily as I really feel symptoms are stemming from silent reflux.  I will call you with your Chest x-ray results.  Stop the Mucinex-D as it is raising your BP.  Read information below. Follow-up if symptoms are not improving.  Food Choices for Gastroesophageal Reflux Disease When you have gastroesophageal reflux disease (GERD), the foods you eat and your eating habits are very important. Choosing the right foods can help ease the discomfort of GERD. WHAT GENERAL GUIDELINES DO I NEED TO FOLLOW?  Choose fruits, vegetables, whole grains, low-fat dairy products, and low-fat meat, fish, and poultry.  Limit fats such as oils, salad dressings, butter, nuts, and avocado.  Keep a food diary to identify foods that cause symptoms.  Avoid foods that cause reflux. These may be different for different people.  Eat frequent small meals instead of three large meals each day.  Eat your meals slowly, in a relaxed setting.  Limit fried foods.  Cook foods using methods other than frying.  Avoid drinking alcohol.  Avoid drinking large amounts of liquids with your meals.  Avoid bending over or lying down until 2-3 hours after eating. WHAT FOODS ARE NOT RECOMMENDED? The following are some foods and drinks that may worsen your symptoms: Vegetables Tomatoes. Tomato juice. Tomato and spaghetti sauce. Chili peppers. Onion and garlic. Horseradish. Fruits Oranges, grapefruit, and lemon (fruit and juice). Meats High-fat meats, fish, and poultry. This includes hot dogs, ribs, ham, sausage, salami, and bacon. Dairy Whole milk and chocolate milk. Sour cream. Cream. Butter. Ice cream. Cream cheese.  Beverages Coffee and tea, with or without caffeine. Carbonated beverages or energy drinks. Condiments Hot sauce. Barbecue sauce.  Sweets/Desserts Chocolate and cocoa. Donuts. Peppermint and spearmint. Fats and Oils High-fat foods, including [place]  fries and potato chips. Other Vinegar. Strong spices, such as black pepper, white pepper, red pepper, cayenne, curry powder, cloves, ginger, and chili powder. The items listed above may not be a complete list of foods and beverages to avoid. Contact your dietitian for more information. Document Released: 08/13/2005 Document Revised: 08/18/2013 Document Reviewed: 06/17/2013 [facility] Patient Information 2015 [place], [place]. This information is not intended to replace advice given to you by your health care provider. Make sure you discuss any questions you have with your health care provider.

## 2014-09-01 NOTE — Progress Notes (Signed)
History of Present Illness: Christina [name] is a 74 y.o. female who present to the clinic today complaining of nasal congestion x 1 month. Patient endorses now with a mostly non-productive cough, worse at night.  Patient denies fever, chills, aches, SOB, wheezing. Patient with history of GERD and hiatal hernia.  Is prescribed Prilosec but does not take it. Is a former smoker with family hx of lung cancer.  History: Past Medical History  Diagnosis Date  . Vertigo   . Chicken pox as a child  . Measles as a child  . Mumps as a child  . Asthma     a little?  [name] Hypertension   . Urinary frequency 09/28/2011  . Cervical cancer screening 01/31/2012  . Dysphagia 01/31/2012  . Routine health maintenance 01/31/2012  . Sinusitis, acute 07/10/2012  . Hiatal hernia with gastroesophageal reflux 10/26/2010    Qualifier: Diagnosis of  By: [name] MD, [name]    . Dermatitis of external ear 07/11/2012  . Neck pain on left side 06/21/2013  . Hyperglycemia 06/21/2013  . Depression with anxiety 06/07/2009    Qualifier: Diagnosis of  By: [name] MD, [name]     Current outpatient prescriptions: ALPRAZolam (XANAX) 0.5 MG tablet, Take 1 tablet (0.5 mg total) by mouth at bedtime as needed for anxiety or sleep., Disp: 30 tablet, Rfl: 2;  Calcium Carbonate (CALCIUM 600 PO), Take by mouth.  , Disp: , Rfl: ;  Cholecalciferol (VITAMIN D) 1000 UNITS capsule, Take 1,000 Units by mouth daily.  , Disp: , Rfl: ;  fish oil-omega-3 fatty acids 1000 MG capsule, Take 1 g by mouth daily.  , Disp: , Rfl:  losartan (COZAAR) 50 MG tablet, TAKE 1 TABLET BY MOUTH EVERY DAY, Disp: 90 tablet, Rfl: 0;  omeprazole (PRILOSEC) 40 MG capsule, Take 1 capsule (40 mg total) by mouth daily., Disp: 90 capsule, Rfl: 1;  sertraline (ZOLOFT) 50 MG tablet, Take 1.5 tablets (75 mg total) by mouth daily., Disp: 45 tablet, Rfl: 5 benzonatate (TESSALON) 100 MG capsule, Take 1 capsule (100 mg total) by mouth 2 (two) times daily as needed for cough.,  Disp: 20 capsule, Rfl: 0 Allergies  Allergen Reactions  . Hydrochlorothiazide     REACTION: cough  . Lisinopril     REACTION: cough   Family History  Problem Relation Age of Onset  . Cancer Mother 32    lung- smoker, ovarian  . Alcohol abuse Father   . Cancer Sister     ovarian  . Diabetes Sister   . Hypertension Sister   . Other Maternal Grandmother     pacemaker  . Multiple sclerosis Maternal Grandfather     ?   History   Social History  . Marital Status: Widowed    Spouse Name: N/A    Number of Children: N/A  . Years of Education: N/A   Social History Main Topics  . Smoking status: Former Smoker -- 1.00 packs/day for 30 years    Types: Cigarettes    Quit date: 08/27/1990  . Smokeless tobacco: Never Used  . Alcohol Use: No  . Drug Use: No  . Sexual Activity: None   Other Topics Concern  . None   Social History Narrative    Review of Systems: See HPI.  All other ROS are negative.  Physical Examination: BP 181/74 mmHg  Pulse 72  Temp(Src) 97.7 F (36.5 C) (Oral)  Resp 16  Ht 5\' 5"  (1.651 m)  Wt 182 lb 8 oz (82.781 kg)  BMI 30.37 kg/m2  SpO2 99%  General appearance: alert, cooperative and appears stated age Head: Normocephalic, without obvious abnormality, atraumatic, sinuses non-tender to percussion Eyes: conjunctivae/corneas clear. PERRL, EOM's intact. Fundi benign. Ears: normal TM's and external ear canals both ears Nose: no congestion, nasal turbinates within normal limits, no sinus tenderness  Throat: lips, mucosa, and tongue normal; teeth and gums normal Neck: no adenopathy, no carotid bruit, no JVD, supple, symmetrical, trachea midline and thyroid not enlarged, symmetric, no tenderness/mass/nodules Lungs: clear to auscultation bilaterally Chest wall: no tenderness  Assessment/Plan: Chronic cough Suspect GERD-related.  Will begin Prilosec 40 mg daily for 2 week trial. GERD diet discussed.  Rx Tessalon for cough.  Giving family history of  lung cancer and personal smoking history, will check CXR today.  Follow-up in 2 weeks.

## 2014-09-04 DIAGNOSIS — R05 Cough: Secondary | ICD-10-CM | POA: Insufficient documentation

## 2014-09-04 DIAGNOSIS — R053 Chronic cough: Secondary | ICD-10-CM | POA: Insufficient documentation

## 2014-09-04 NOTE — Assessment & Plan Note (Signed)
Suspect GERD-related.  Will begin Prilosec 40 mg daily for 2 week trial. GERD diet discussed.  Rx Tessalon for cough.  Giving family history of lung cancer and personal smoking history, will check CXR today.  Follow-up in 2 weeks.

## 2014-09-08 ENCOUNTER — Encounter: Payer: Self-pay | Admitting: *Deleted

## 2014-09-29 ENCOUNTER — Telehealth: Payer: Self-pay | Admitting: Family Medicine

## 2014-09-29 DIAGNOSIS — F418 Other specified anxiety disorders: Secondary | ICD-10-CM

## 2014-09-29 NOTE — Telephone Encounter (Signed)
Last filled:  05/31/14 Amt:  30, 2 refills Last OV:  05/31/14 UDS---LOW risk No contract on file  Please advise.

## 2014-09-29 NOTE — Telephone Encounter (Signed)
Caller name:Rascon, Mardene Celeste Relation to KR:CVKF Call back number:[phone] Pharmacy:[facility]  Reason for call: pt is needing rx ALPRAZolam (XANAX) 0.5 MG tablet ,

## 2014-09-29 NOTE — Telephone Encounter (Signed)
She can have a refill on her Alprazolam same strength, same sig, #30 with 1 rf, needs contract signed for more

## 2014-09-30 ENCOUNTER — Telehealth: Payer: Self-pay | Admitting: Family Medicine

## 2014-09-30 MED ORDER — ALPRAZOLAM 0.5 MG PO TABS: 0.5000 mg | ORAL_TABLET | Freq: Every evening | ORAL | Status: DC | PRN

## 2014-09-30 NOTE — Telephone Encounter (Signed)
Pt returned call

## 2014-09-30 NOTE — Telephone Encounter (Signed)
Rx printed and placed in Dr. Frederik Pear red folder for review and signature.  Contract printed.

## 2014-09-30 NOTE — Telephone Encounter (Signed)
Caller name: Meiko, Ives Relation to pt: self  Call back number: [phone] Pharmacy:  Reason for call:  Pt can not print out Controlled Substance Contract due to her printer not working. Contract mailed to home as per patient request.

## 2014-09-30 NOTE — Telephone Encounter (Signed)
LMOM @ 12:08pm @ [phone]) asking the pt to RTC regarding rx request.//AB/CMA

## 2014-09-30 NOTE — Telephone Encounter (Signed)
Called and spoke with the pt and informed her that the UDS was mailed to her.//AB/CMA

## 2014-10-01 NOTE — Telephone Encounter (Signed)
Message routed to [facility], [place].

## 2014-10-07 NOTE — Telephone Encounter (Signed)
Done//AB/CMA

## 2014-10-13 IMAGING — CR DG CERVICAL SPINE COMPLETE 4+V
5 series · 5 of 5 positions shown · non-contrast
Comparison: None.

CLINICAL DATA: Chronic neck pain.

EXAM:
CERVICAL SPINE  4+ VIEWS

[w c-spine lat]
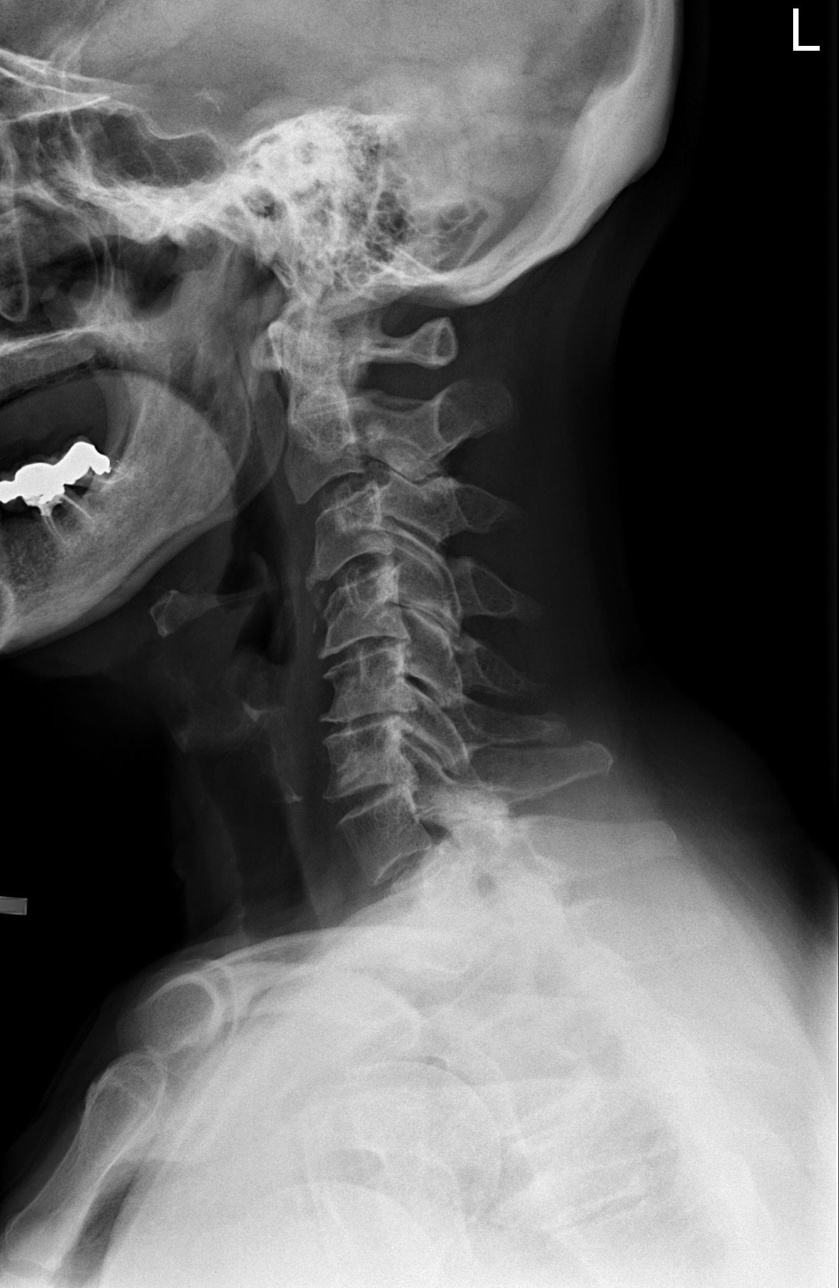

[w c-spine oblique (1 of 2)]
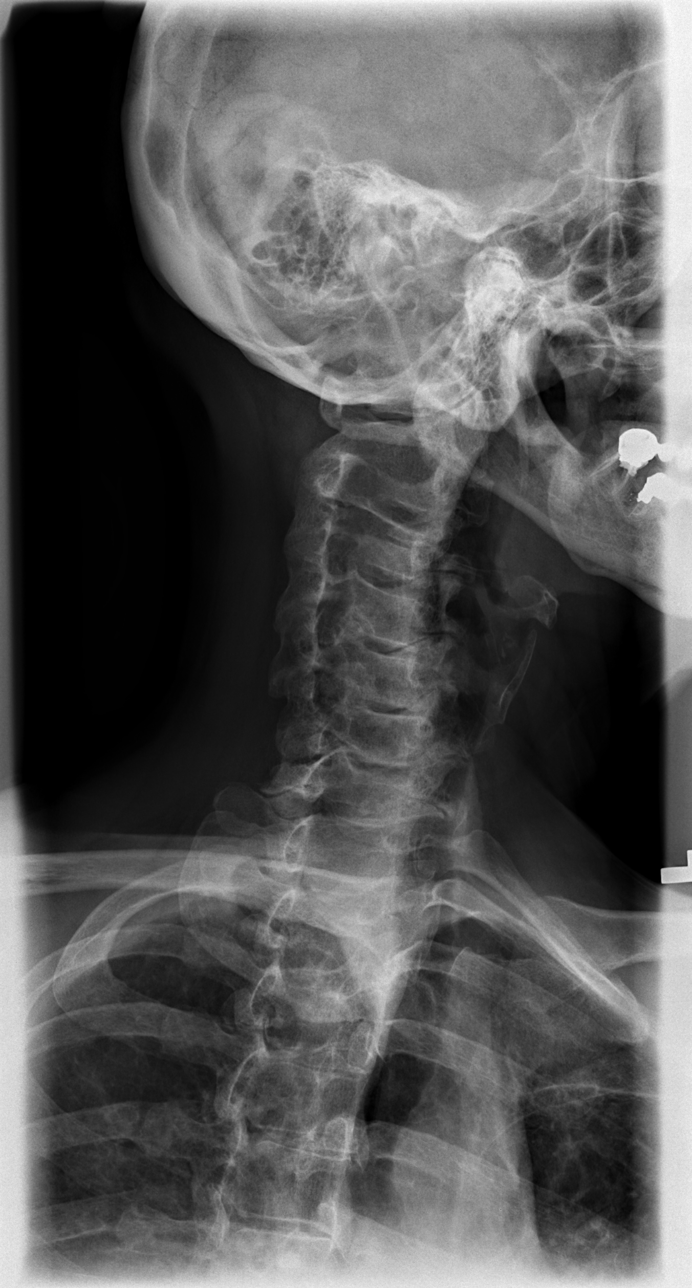

[w c-spine oblique (2 of 2)]
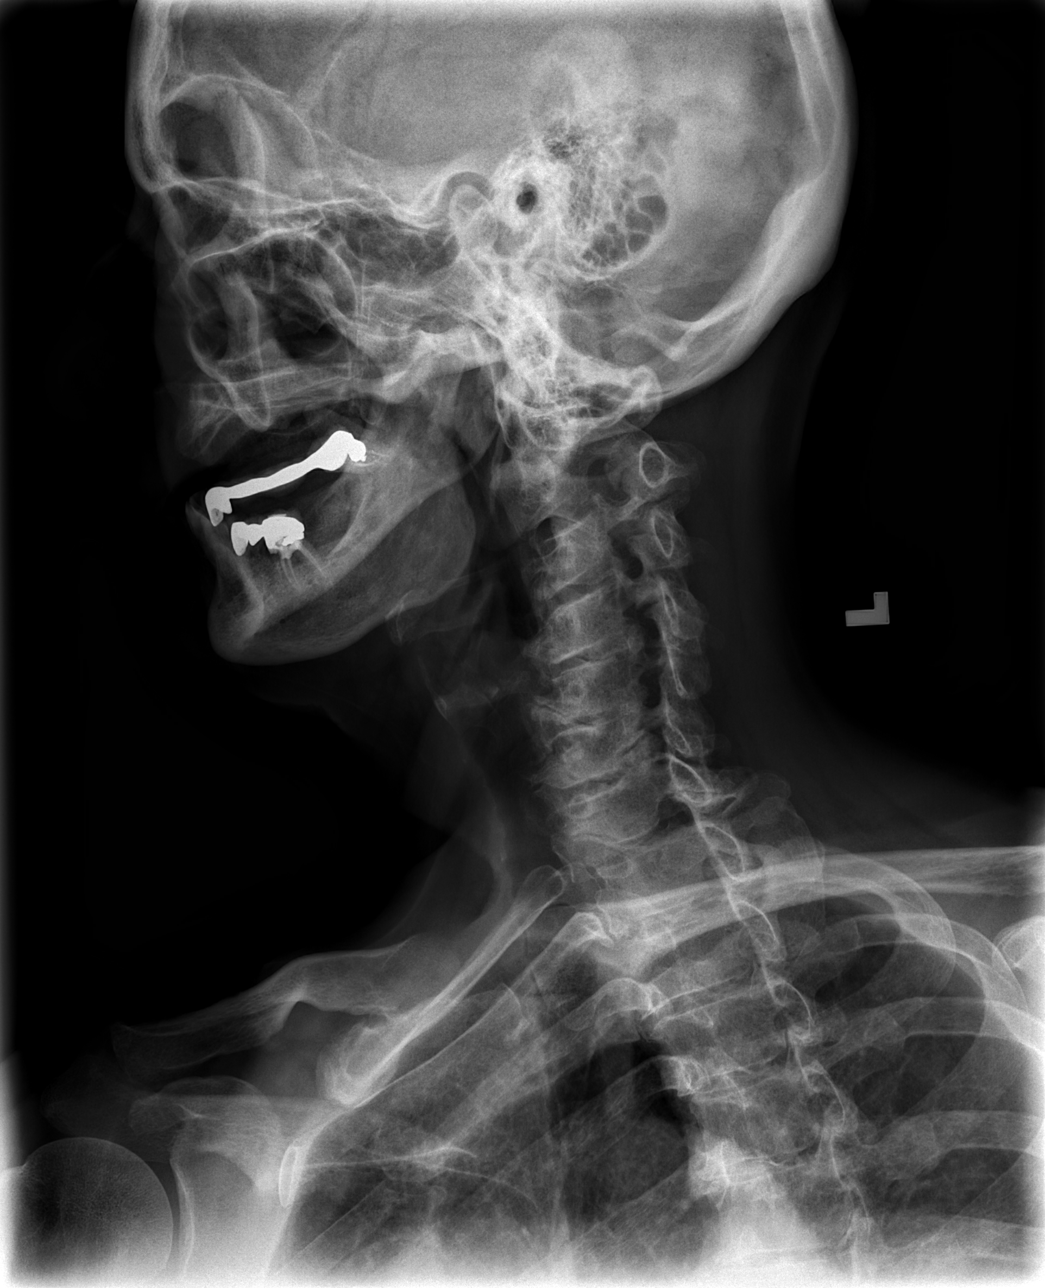

[w c-spine a.p.]
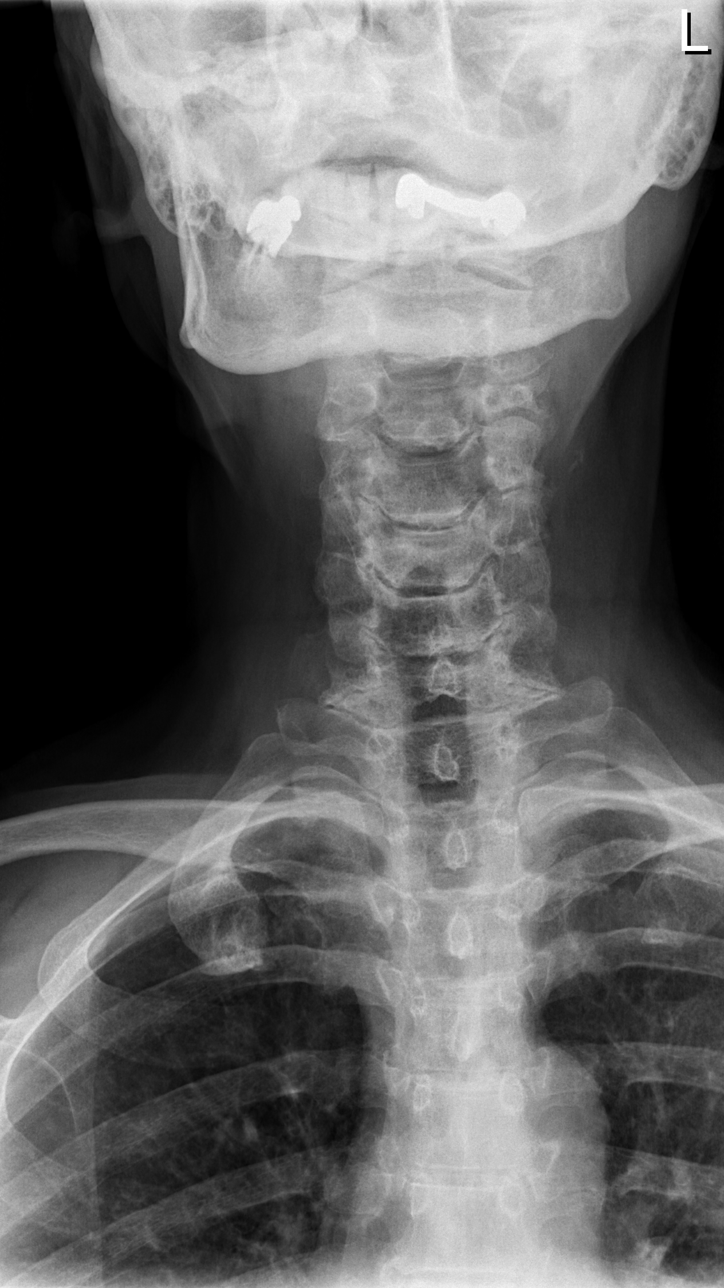

[w c-spine odontoid]
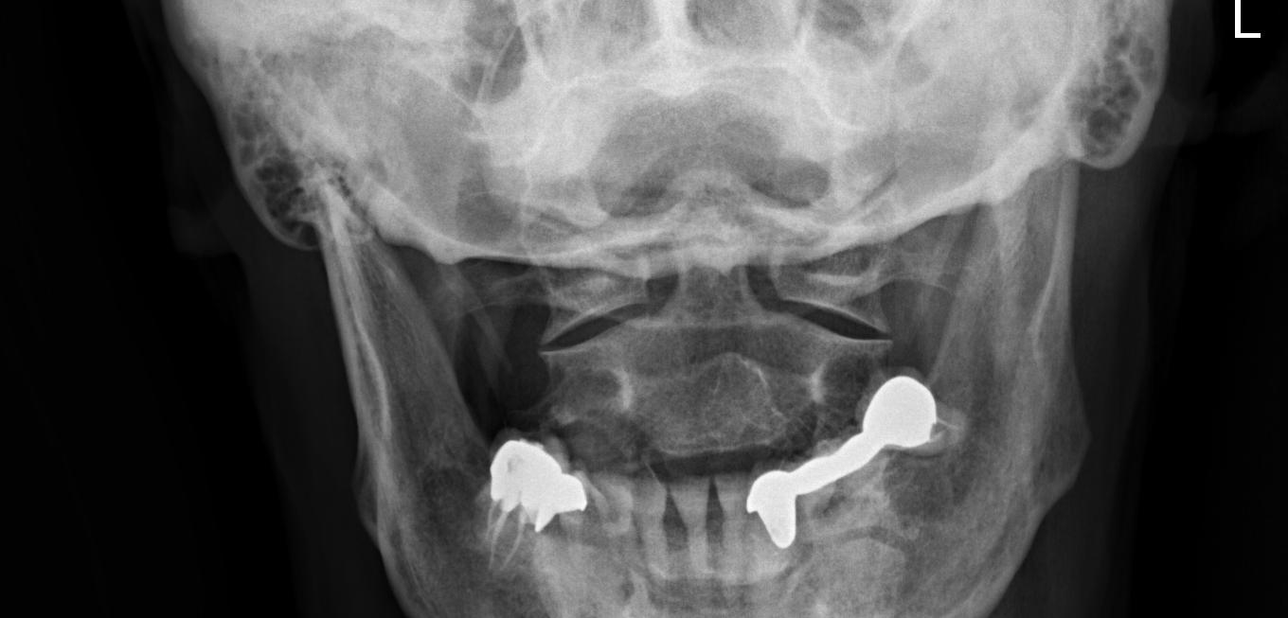

[5 of 5 positions shown; findings below may reference images not displayed]

FINDINGS: Advanced degenerative cervical spondylosis with multilevel disc
disease and facet disease. Multilevel degenerative subluxations are
noted. No acute fracture or abnormal prevertebral soft tissue
swelling. Bony foraminal narrowing bilaterally due to uncinate
spurring and facet disease.

The C1-2 articulations are maintained.  The lung apices are clear.
IMPRESSION: Degenerative cervical spondylosis with disc disease and facet
disease.

No acute bony findings.

## 2014-10-22 ENCOUNTER — Encounter: Payer: Medicare Other | Admitting: Family Medicine

## 2014-11-16 ENCOUNTER — Other Ambulatory Visit: Payer: Self-pay | Admitting: Family Medicine

## 2014-11-16 NOTE — Telephone Encounter (Signed)
Med filled.  

## 2014-12-18 ENCOUNTER — Other Ambulatory Visit: Payer: Self-pay | Admitting: Family Medicine

## 2015-01-12 ENCOUNTER — Telehealth: Payer: Self-pay | Admitting: Family Medicine

## 2015-01-12 NOTE — Telephone Encounter (Signed)
Pre Visit letter sent  °

## 2015-01-18 ENCOUNTER — Other Ambulatory Visit: Payer: Self-pay | Admitting: Family Medicine

## 2015-02-03 ENCOUNTER — Encounter: Payer: Self-pay | Admitting: *Deleted

## 2015-02-03 ENCOUNTER — Telehealth: Payer: Self-pay | Admitting: *Deleted

## 2015-02-03 NOTE — Telephone Encounter (Signed)
Pre-Visit Call completed with patient and chart updated.   Pre-Visit Info documented in Specialty Comments under [employer].    

## 2015-02-04 ENCOUNTER — Ambulatory Visit (INDEPENDENT_AMBULATORY_CARE_PROVIDER_SITE_OTHER): Payer: Medicare Other | Admitting: Family Medicine

## 2015-02-04 ENCOUNTER — Encounter: Payer: Self-pay | Admitting: Family Medicine

## 2015-02-04 VITALS — BP 122/62 | HR 65 | Temp 97.9°F | Ht 65.5 in | Wt 179.6 lb

## 2015-02-04 DIAGNOSIS — K219 Gastro-esophageal reflux disease without esophagitis: Secondary | ICD-10-CM | POA: Diagnosis not present

## 2015-02-04 DIAGNOSIS — F418 Other specified anxiety disorders: Secondary | ICD-10-CM | POA: Diagnosis not present

## 2015-02-04 DIAGNOSIS — K449 Diaphragmatic hernia without obstruction or gangrene: Secondary | ICD-10-CM | POA: Diagnosis not present

## 2015-02-04 DIAGNOSIS — R739 Hyperglycemia, unspecified: Secondary | ICD-10-CM | POA: Diagnosis not present

## 2015-02-04 DIAGNOSIS — M8580 Other specified disorders of bone density and structure, unspecified site: Secondary | ICD-10-CM

## 2015-02-04 DIAGNOSIS — E669 Obesity, unspecified: Secondary | ICD-10-CM

## 2015-02-04 DIAGNOSIS — R1310 Dysphagia, unspecified: Secondary | ICD-10-CM

## 2015-02-04 DIAGNOSIS — Z124 Encounter for screening for malignant neoplasm of cervix: Secondary | ICD-10-CM

## 2015-02-04 DIAGNOSIS — I10 Essential (primary) hypertension: Secondary | ICD-10-CM

## 2015-02-04 DIAGNOSIS — Z0000 Encounter for general adult medical examination without abnormal findings: Secondary | ICD-10-CM | POA: Diagnosis not present

## 2015-02-04 LAB — COMPREHENSIVE METABOLIC PANEL
ALT: 12 U/L (ref 0–35)
AST: 21 U/L (ref 0–37)
Albumin: 4.3 g/dL (ref 3.5–5.2)
Alkaline Phosphatase: 67 U/L (ref 39–117)
BUN: 18 mg/dL (ref 6–23)
CO2: 29 mEq/L (ref 19–32)
Calcium: 10.2 mg/dL (ref 8.4–10.5)
Chloride: 104 mEq/L (ref 96–112)
Creatinine, Ser: 0.97 mg/dL (ref 0.40–1.20)
GFR: 59.81 mL/min — ABNORMAL LOW (ref >60.00)
Glucose, Bld: 104 mg/dL — ABNORMAL HIGH (ref 70–99)
Potassium: 4 mEq/L (ref 3.5–5.1)
Sodium: 139 mEq/L (ref 135–145)
Total Bilirubin: 1.2 mg/dL (ref 0.2–1.2)
Total Protein: 7.1 g/dL (ref 6.0–8.3)

## 2015-02-04 LAB — LIPID PANEL
Cholesterol: 199 mg/dL (ref 0–200)
HDL: 59.7 mg/dL (ref >39.00)
LDL Cholesterol: 126 mg/dL — ABNORMAL HIGH (ref 0–99)
NonHDL: 139.3
Total CHOL/HDL Ratio: 3
Triglycerides: 69 mg/dL (ref 0.0–149.0)
VLDL: 13.8 mg/dL (ref 0.0–40.0)

## 2015-02-04 LAB — CBC
HCT: 43.4 % (ref 36.0–46.0)
Hemoglobin: 14.4 g/dL (ref 12.0–15.0)
MCHC: 33.1 g/dL (ref 30.0–36.0)
MCV: 94.7 fl (ref 78.0–100.0)
Platelets: 210 10*3/uL (ref 150.0–400.0)
RBC: 4.58 Mil/uL (ref 3.87–5.11)
RDW: 14.7 % (ref 11.5–15.5)
WBC: 5.5 10*3/uL (ref 4.0–10.5)

## 2015-02-04 LAB — VITAMIN D 25 HYDROXY (VIT D DEFICIENCY, FRACTURES): VITD: 30.64 ng/mL (ref 30.00–100.00)

## 2015-02-04 LAB — HEMOGLOBIN A1C: Hgb A1c MFr Bld: 5.7 % (ref 4.6–6.5)

## 2015-02-04 LAB — TSH: TSH: 1.41 u[IU]/mL (ref 0.35–4.50)

## 2015-02-04 MED ORDER — LOSARTAN POTASSIUM 50 MG PO TABS: 50.0000 mg | ORAL_TABLET | Freq: Every day | ORAL | Status: DC

## 2015-02-04 MED ORDER — ALPRAZOLAM 0.5 MG PO TABS
0.5000 mg | ORAL_TABLET | Freq: Every evening | ORAL | Status: DC | PRN
Start: 2015-02-04 — End: 2015-02-04

## 2015-02-04 MED ORDER — SERTRALINE HCL 50 MG PO TABS: 25.0000 mg | ORAL_TABLET | Freq: Every day | ORAL | Status: DC

## 2015-02-04 MED ORDER — ALPRAZOLAM 0.5 MG PO TABS: 0.2500 mg | ORAL_TABLET | Freq: Two times a day (BID) | ORAL | Status: DC | PRN

## 2015-02-04 NOTE — Patient Instructions (Addendum)
Can try Ranitidine/Zantac 150 mg daily prn reflux    Preventive Care for Adults A healthy lifestyle and preventive care can promote health and wellness. Preventive health guidelines for women include the following key practices.  A routine yearly physical is a good way to check with your health care provider about your health and preventive screening. It is a chance to share any concerns and updates on your health and to receive a thorough exam.  Visit your dentist for a routine exam and preventive care every 6 months. Brush your teeth twice a day and floss once a day. Good oral hygiene prevents tooth decay and gum disease.  The frequency of eye exams is based on your age, health, family medical history, use of contact lenses, and other factors. Follow your health care provider's recommendations for frequency of eye exams.  Eat a healthy diet. Foods like vegetables, fruits, whole grains, low-fat dairy products, and lean protein foods contain the nutrients you need without too many calories. Decrease your intake of foods high in solid fats, added sugars, and salt. Eat the right amount of calories for you.Get information about a proper diet from your health care provider, if necessary.  Regular physical exercise is one of the most important things you can do for your health. Most adults should get at least 150 minutes of moderate-intensity exercise (any activity that increases your heart rate and causes you to sweat) each week. In addition, most adults need muscle-strengthening exercises on 2 or more days a week.  Maintain a healthy weight. The body mass index (BMI) is a screening tool to identify possible weight problems. It provides an estimate of body fat based on height and weight. Your health care provider can find your BMI and can help you achieve or maintain a healthy weight.For adults 20 years and older:  A BMI below 18.5 is considered underweight.  A BMI of 18.5 to 24.9 is normal.  A  BMI of 25 to 29.9 is considered overweight.  A BMI of 30 and above is considered obese.  Maintain normal blood lipids and cholesterol levels by exercising and minimizing your intake of saturated fat. Eat a balanced diet with plenty of fruit and vegetables. Blood tests for lipids and cholesterol should begin at age 20 and be repeated every 5 years. If your lipid or cholesterol levels are high, you are over 50, or you are at high risk for heart disease, you may need your cholesterol levels checked more frequently.Ongoing high lipid and cholesterol levels should be treated with medicines if diet and exercise are not working.  If you smoke, find out from your health care provider how to quit. If you do not use tobacco, do not start.  Lung cancer screening is recommended for adults aged 55-80 years who are at high risk for developing lung cancer because of a history of smoking. A yearly low-dose CT scan of the lungs is recommended for people who have at least a 30-pack-year history of smoking and are a current smoker or have quit within the past 15 years. A pack year of smoking is smoking an average of 1 pack of cigarettes a day for 1 year (for example: 1 pack a day for 30 years or 2 packs a day for 15 years). Yearly screening should continue until the smoker has stopped smoking for at least 15 years. Yearly screening should be stopped for people who develop a health problem that would prevent them from having lung cancer treatment.  If   you are pregnant, do not drink alcohol. If you are breastfeeding, be very cautious about drinking alcohol. If you are not pregnant and choose to drink alcohol, do not have more than 1 drink per day. One drink is considered to be 12 ounces (355 mL) of beer, 5 ounces (148 mL) of wine, or 1.5 ounces (44 mL) of liquor.  Avoid use of street drugs. Do not share needles with anyone. Ask for help if you need support or instructions about stopping the use of drugs.  High blood  pressure causes heart disease and increases the risk of stroke. Your blood pressure should be checked at least every 1 to 2 years. Ongoing high blood pressure should be treated with medicines if weight loss and exercise do not work.  If you are 55-79 years old, ask your health care provider if you should take aspirin to prevent strokes.  Diabetes screening involves taking a blood sample to check your fasting blood sugar level. This should be done once every 3 years, after age 45, if you are within normal weight and without risk factors for diabetes. Testing should be considered at a younger age or be carried out more frequently if you are overweight and have at least 1 risk factor for diabetes.  Breast cancer screening is essential preventive care for women. You should practice "breast self-awareness." This means understanding the normal appearance and feel of your breasts and may include breast self-examination. Any changes detected, no matter how small, should be reported to a health care provider. Women in their 20s and 30s should have a clinical breast exam (CBE) by a health care provider as part of a regular health exam every 1 to 3 years. After age 40, women should have a CBE every year. Starting at age 40, women should consider having a mammogram (breast X-ray test) every year. Women who have a family history of breast cancer should talk to their health care provider about genetic screening. Women at a high risk of breast cancer should talk to their health care providers about having an MRI and a mammogram every year.  Breast cancer gene (BRCA)-related cancer risk assessment is recommended for women who have family members with BRCA-related cancers. BRCA-related cancers include breast, ovarian, tubal, and peritoneal cancers. Having family members with these cancers may be associated with an increased risk for harmful changes (mutations) in the breast cancer genes BRCA1 and BRCA2. Results of the  assessment will determine the need for genetic counseling and BRCA1 and BRCA2 testing.  Routine pelvic exams to screen for cancer are no longer recommended for nonpregnant women who are considered low risk for cancer of the pelvic organs (ovaries, uterus, and vagina) and who do not have symptoms. Ask your health care provider if a screening pelvic exam is right for you.  If you have had past treatment for cervical cancer or a condition that could lead to cancer, you need Pap tests and screening for cancer for at least 20 years after your treatment. If Pap tests have been discontinued, your risk factors (such as having a new sexual partner) need to be reassessed to determine if screening should be resumed. Some women have medical problems that increase the chance of getting cervical cancer. In these cases, your health care provider may recommend more frequent screening and Pap tests.  The HPV test is an additional test that may be used for cervical cancer screening. The HPV test looks for the virus that can cause the cell changes on   the cervix. The cells collected during the Pap test can be tested for HPV. The HPV test could be used to screen women aged 30 years and older, and should be used in women of any age who have unclear Pap test results. After the age of 30, women should have HPV testing at the same frequency as a Pap test.  Colorectal cancer can be detected and often prevented. Most routine colorectal cancer screening begins at the age of 50 years and continues through age 75 years. However, your health care provider may recommend screening at an earlier age if you have risk factors for colon cancer. On a yearly basis, your health care provider may provide home test kits to check for hidden blood in the stool. Use of a small camera at the end of a tube, to directly examine the colon (sigmoidoscopy or colonoscopy), can detect the earliest forms of colorectal cancer. Talk to your health care provider  about this at age 50, when routine screening begins. Direct exam of the colon should be repeated every 5-10 years through age 75 years, unless early forms of pre-cancerous polyps or small growths are found.  People who are at an increased risk for hepatitis B should be screened for this virus. You are considered at high risk for hepatitis B if:  You were born in a country where hepatitis B occurs often. Talk with your health care provider about which countries are considered high risk.  Your parents were born in a high-risk country and you have not received a shot to protect against hepatitis B (hepatitis B vaccine).  You have HIV or AIDS.  You use needles to inject street drugs.  You live with, or have sex with, someone who has hepatitis B.  You get hemodialysis treatment.  You take certain medicines for conditions like cancer, organ transplantation, and autoimmune conditions.  Hepatitis C blood testing is recommended for all people born from 1945 through 1965 and any individual with known risks for hepatitis C.  Practice safe sex. Use condoms and avoid high-risk sexual practices to reduce the spread of sexually transmitted infections (STIs). STIs include gonorrhea, chlamydia, syphilis, trichomonas, herpes, HPV, and human immunodeficiency virus (HIV). Herpes, HIV, and HPV are viral illnesses that have no cure. They can result in disability, cancer, and death.  You should be screened for sexually transmitted illnesses (STIs) including gonorrhea and chlamydia if:  You are sexually active and are younger than 24 years.  You are older than 24 years and your health care provider tells you that you are at risk for this type of infection.  Your sexual activity has changed since you were last screened and you are at an increased risk for chlamydia or gonorrhea. Ask your health care provider if you are at risk.  If you are at risk of being infected with HIV, it is recommended that you take a  prescription medicine daily to prevent HIV infection. This is called preexposure prophylaxis (PrEP). You are considered at risk if:  You are a heterosexual woman, are sexually active, and are at increased risk for HIV infection.  You take drugs by injection.  You are sexually active with a partner who has HIV.  Talk with your health care provider about whether you are at high risk of being infected with HIV. If you choose to begin PrEP, you should first be tested for HIV. You should then be tested every 3 months for as long as you are taking PrEP.  Osteoporosis   is a disease in which the bones lose minerals and strength with aging. This can result in serious bone fractures or breaks. The risk of osteoporosis can be identified using a bone density scan. Women ages 22 years and over and women at risk for fractures or osteoporosis should discuss screening with their health care providers. Ask your health care provider whether you should take a calcium supplement or vitamin D to reduce the rate of osteoporosis.  Menopause can be associated with physical symptoms and risks. Hormone replacement therapy is available to decrease symptoms and risks. You should talk to your health care provider about whether hormone replacement therapy is right for you.  Use sunscreen. Apply sunscreen liberally and repeatedly throughout the day. You should seek shade when your shadow is shorter than you. Protect yourself by wearing long sleeves, pants, a wide-brimmed hat, and sunglasses year round, whenever you are outdoors.  Once a month, do a whole body skin exam, using a mirror to look at the skin on your back. Tell your health care provider of new moles, moles that have irregular borders, moles that are larger than a pencil eraser, or moles that have changed in shape or color.  Stay current with required vaccines (immunizations).  Influenza vaccine. All adults should be immunized every year.  Tetanus, diphtheria, and  acellular pertussis (Td, Tdap) vaccine. Pregnant women should receive 1 dose of Tdap vaccine during each pregnancy. The dose should be obtained regardless of the length of time since the last dose. Immunization is preferred during the 27th-36th week of gestation. An adult who has not previously received Tdap or who does not know her vaccine status should receive 1 dose of Tdap. This initial dose should be followed by tetanus and diphtheria toxoids (Td) booster doses every 10 years. Adults with an unknown or incomplete history of completing a 3-dose immunization series with Td-containing vaccines should begin or complete a primary immunization series including a Tdap dose. Adults should receive a Td booster every 10 years.  Varicella vaccine. An adult without evidence of immunity to varicella should receive 2 doses or a second dose if she has previously received 1 dose. Pregnant females who do not have evidence of immunity should receive the first dose after pregnancy. This first dose should be obtained before leaving the health care facility. The second dose should be obtained 4-8 weeks after the first dose.  Human papillomavirus (HPV) vaccine. Females aged 13-26 years who have not received the vaccine previously should obtain the 3-dose series. The vaccine is not recommended for use in pregnant females. However, pregnancy testing is not needed before receiving a dose. If a female is found to be pregnant after receiving a dose, no treatment is needed. In that case, the remaining doses should be delayed until after the pregnancy. Immunization is recommended for any person with an immunocompromised condition through the age of 97 years if she did not get any or all doses earlier. During the 3-dose series, the second dose should be obtained 4-8 weeks after the first dose. The third dose should be obtained 24 weeks after the first dose and 16 weeks after the second dose.  Zoster vaccine. One dose is recommended  for adults aged 38 years or older unless certain conditions are present.  Measles, mumps, and rubella (MMR) vaccine. Adults born before 65 generally are considered immune to measles and mumps. Adults born in [birth year] or later should have 1 or more doses of MMR vaccine unless there is a contraindication  to the vaccine or there is laboratory evidence of immunity to each of the three diseases. A routine second dose of MMR vaccine should be obtained at least 28 days after the first dose for students attending postsecondary schools, health care workers, or international travelers. People who received inactivated measles vaccine or an unknown type of measles vaccine during 1963-1967 should receive 2 doses of MMR vaccine. People who received inactivated mumps vaccine or an unknown type of mumps vaccine before 1979 and are at high risk for mumps infection should consider immunization with 2 doses of MMR vaccine. For females of childbearing age, rubella immunity should be determined. If there is no evidence of immunity, females who are not pregnant should be vaccinated. If there is no evidence of immunity, females who are pregnant should delay immunization until after pregnancy. Unvaccinated health care workers born before [birth year] who lack laboratory evidence of measles, mumps, or rubella immunity or laboratory confirmation of disease should consider measles and mumps immunization with 2 doses of MMR vaccine or rubella immunization with 1 dose of MMR vaccine.  Pneumococcal 13-valent conjugate (PCV13) vaccine. When indicated, a person who is uncertain of her immunization history and has no record of immunization should receive the PCV13 vaccine. An adult aged 48 years or older who has certain medical conditions and has not been previously immunized should receive 1 dose of PCV13 vaccine. This PCV13 should be followed with a dose of pneumococcal polysaccharide (PPSV23) vaccine. The PPSV23 vaccine dose should be obtained at  least 8 weeks after the dose of PCV13 vaccine. An adult aged 79 years or older who has certain medical conditions and previously received 1 or more doses of PPSV23 vaccine should receive 1 dose of PCV13. The PCV13 vaccine dose should be obtained 1 or more years after the last PPSV23 vaccine dose.  Pneumococcal polysaccharide (PPSV23) vaccine. When PCV13 is also indicated, PCV13 should be obtained first. All adults aged 66 years and older should be immunized. An adult younger than age 51 years who has certain medical conditions should be immunized. Any person who resides in a nursing home or long-term care facility should be immunized. An adult smoker should be immunized. People with an immunocompromised condition and certain other conditions should receive both PCV13 and PPSV23 vaccines. People with human immunodeficiency virus (HIV) infection should be immunized as soon as possible after diagnosis. Immunization during chemotherapy or radiation therapy should be avoided. Routine use of PPSV23 vaccine is not recommended for American Indians, [place] Natives, or people younger than 65 years unless there are medical conditions that require PPSV23 vaccine. When indicated, people who have unknown immunization and have no record of immunization should receive PPSV23 vaccine. One-time revaccination 5 years after the first dose of PPSV23 is recommended for people aged 19-64 years who have chronic kidney failure, nephrotic syndrome, asplenia, or immunocompromised conditions. People who received 1-2 doses of PPSV23 before age 45 years should receive another dose of PPSV23 vaccine at age 1 years or later if at least 5 years have passed since the previous dose. Doses of PPSV23 are not needed for people immunized with PPSV23 at or after age 34 years.  Meningococcal vaccine. Adults with asplenia or persistent complement component deficiencies should receive 2 doses of quadrivalent meningococcal conjugate (MenACWY-D) vaccine.  The doses should be obtained at least 2 months apart. Microbiologists working with certain meningococcal bacteria, [place] recruits, people at risk during an outbreak, and people who travel to or live in countries with a high rate of  meningitis should be immunized. A first-year college student up through age 53 years who is living in a residence hall should receive a dose if she did not receive a dose on or after her 16th birthday. Adults who have certain high-risk conditions should receive one or more doses of vaccine.  Hepatitis A vaccine. Adults who wish to be protected from this disease, have certain high-risk conditions, work with hepatitis A-infected animals, work in hepatitis A research labs, or travel to or work in countries with a high rate of hepatitis A should be immunized. Adults who were previously unvaccinated and who anticipate close contact with an international adoptee during the first 60 days after arrival in the [place] from a country with a high rate of hepatitis A should be immunized.  Hepatitis B vaccine. Adults who wish to be protected from this disease, have certain high-risk conditions, may be exposed to blood or other infectious body fluids, are household contacts or sex partners of hepatitis B positive people, are clients or workers in certain care facilities, or travel to or work in countries with a high rate of hepatitis B should be immunized.  Haemophilus influenzae type b (Hib) vaccine. A previously unvaccinated person with asplenia or sickle cell disease or having a scheduled splenectomy should receive 1 dose of Hib vaccine. Regardless of previous immunization, a recipient of a hematopoietic stem cell transplant should receive a 3-dose series 6-12 months after her successful transplant. Hib vaccine is not recommended for adults with HIV infection. Preventive Services / Frequency Ages 49 to 20 years  Blood pressure check.** / Every 1 to 2 years.  Lipid and  cholesterol check.** / Every 5 years beginning at age 30.  Clinical breast exam.** / Every 3 years for women in their 67s and 3s.  BRCA-related cancer risk assessment.** / For women who have family members with a BRCA-related cancer (breast, ovarian, tubal, or peritoneal cancers).  Pap test.** / Every 2 years from ages 68 through 72. Every 3 years starting at age 84 through age 30 or 11 with a history of 3 consecutive normal Pap tests.  HPV screening.** / Every 3 years from ages 36 through ages 75 to 33 with a history of 3 consecutive normal Pap tests.  Hepatitis C blood test.** / For any individual with known risks for hepatitis C.  Skin self-exam. / Monthly.  Influenza vaccine. / Every year.  Tetanus, diphtheria, and acellular pertussis (Tdap, Td) vaccine.** / Consult your health care provider. Pregnant women should receive 1 dose of Tdap vaccine during each pregnancy. 1 dose of Td every 10 years.  Varicella vaccine.** / Consult your health care provider. Pregnant females who do not have evidence of immunity should receive the first dose after pregnancy.  HPV vaccine. / 3 doses over 6 months, if 28 and younger. The vaccine is not recommended for use in pregnant females. However, pregnancy testing is not needed before receiving a dose.  Measles, mumps, rubella (MMR) vaccine.** / You need at least 1 dose of MMR if you were born in 1957 or later. You may also need a 2nd dose. For females of childbearing age, rubella immunity should be determined. If there is no evidence of immunity, females who are not pregnant should be vaccinated. If there is no evidence of immunity, females who are pregnant should delay immunization until after pregnancy.  Pneumococcal 13-valent conjugate (PCV13) vaccine.** / Consult your health care provider.  Pneumococcal polysaccharide (PPSV23) vaccine.** / 1 to 2 doses if you  smoke cigarettes or if you have certain conditions.  Meningococcal vaccine.** / 1 dose if  you are age 19 to 21 years and a first-year college student living in a residence hall, or have one of several medical conditions, you need to get vaccinated against meningococcal disease. You may also need additional booster doses.  Hepatitis A vaccine.** / Consult your health care provider.  Hepatitis B vaccine.** / Consult your health care provider.  Haemophilus influenzae type b (Hib) vaccine.** / Consult your health care provider. Ages 40 to 64 years  Blood pressure check.** / Every 1 to 2 years.  Lipid and cholesterol check.** / Every 5 years beginning at age 20 years.  Lung cancer screening. / Every year if you are aged 55-80 years and have a 30-pack-year history of smoking and currently smoke or have quit within the past 15 years. Yearly screening is stopped once you have quit smoking for at least 15 years or develop a health problem that would prevent you from having lung cancer treatment.  Clinical breast exam.** / Every year after age 40 years.  BRCA-related cancer risk assessment.** / For women who have family members with a BRCA-related cancer (breast, ovarian, tubal, or peritoneal cancers).  Mammogram.** / Every year beginning at age 40 years and continuing for as long as you are in good health. Consult with your health care provider.  Pap test.** / Every 3 years starting at age 30 years through age 65 or 70 years with a history of 3 consecutive normal Pap tests.  HPV screening.** / Every 3 years from ages 30 years through ages 65 to 70 years with a history of 3 consecutive normal Pap tests.  Fecal occult blood test (FOBT) of stool. / Every year beginning at age 50 years and continuing until age 75 years. You may not need to do this test if you get a colonoscopy every 10 years.  Flexible sigmoidoscopy or colonoscopy.** / Every 5 years for a flexible sigmoidoscopy or every 10 years for a colonoscopy beginning at age 50 years and continuing until age 75 years.  Hepatitis C  blood test.** / For all people born from 1945 through 1965 and any individual with known risks for hepatitis C.  Skin self-exam. / Monthly.  Influenza vaccine. / Every year.  Tetanus, diphtheria, and acellular pertussis (Tdap/Td) vaccine.** / Consult your health care provider. Pregnant women should receive 1 dose of Tdap vaccine during each pregnancy. 1 dose of Td every 10 years.  Varicella vaccine.** / Consult your health care provider. Pregnant females who do not have evidence of immunity should receive the first dose after pregnancy.  Zoster vaccine.** / 1 dose for adults aged 60 years or older.  Measles, mumps, rubella (MMR) vaccine.** / You need at least 1 dose of MMR if you were born in 1957 or later. You may also need a 2nd dose. For females of childbearing age, rubella immunity should be determined. If there is no evidence of immunity, females who are not pregnant should be vaccinated. If there is no evidence of immunity, females who are pregnant should delay immunization until after pregnancy.  Pneumococcal 13-valent conjugate (PCV13) vaccine.** / Consult your health care provider.  Pneumococcal polysaccharide (PPSV23) vaccine.** / 1 to 2 doses if you smoke cigarettes or if you have certain conditions.  Meningococcal vaccine.** / Consult your health care provider.  Hepatitis A vaccine.** / Consult your health care provider.  Hepatitis B vaccine.** / Consult your health care provider.  Haemophilus influenzae   type b (Hib) vaccine.** / Consult your health care provider. Ages 65 years and over  Blood pressure check.** / Every 1 to 2 years.  Lipid and cholesterol check.** / Every 5 years beginning at age 55 years.  Lung cancer screening. / Every year if you are aged 49-80 years and have a 30-pack-year history of smoking and currently smoke or have quit within the past 15 years. Yearly screening is stopped once you have quit smoking for at least 15 years or develop a health problem  that would prevent you from having lung cancer treatment.  Clinical breast exam.** / Every year after age 23 years.  BRCA-related cancer risk assessment.** / For women who have family members with a BRCA-related cancer (breast, ovarian, tubal, or peritoneal cancers).  Mammogram.** / Every year beginning at age 62 years and continuing for as long as you are in good health. Consult with your health care provider.  Pap test.** / Every 3 years starting at age 55 years through age 2 or 81 years with 3 consecutive normal Pap tests. Testing can be stopped between 65 and 70 years with 3 consecutive normal Pap tests and no abnormal Pap or HPV tests in the past 10 years.  HPV screening.** / Every 3 years from ages 82 years through ages 68 or 11 years with a history of 3 consecutive normal Pap tests. Testing can be stopped between 65 and 70 years with 3 consecutive normal Pap tests and no abnormal Pap or HPV tests in the past 10 years.  Fecal occult blood test (FOBT) of stool. / Every year beginning at age 15 years and continuing until age 10 years. You may not need to do this test if you get a colonoscopy every 10 years.  Flexible sigmoidoscopy or colonoscopy.** / Every 5 years for a flexible sigmoidoscopy or every 10 years for a colonoscopy beginning at age 40 years and continuing until age 24 years.  Hepatitis C blood test.** / For all people born from [birth year] through 1964 and any individual with known risks for hepatitis C.  Osteoporosis screening.** / A one-time screening for women ages 94 years and over and women at risk for fractures or osteoporosis.  Skin self-exam. / Monthly.  Influenza vaccine. / Every year.  Tetanus, diphtheria, and acellular pertussis (Tdap/Td) vaccine.** / 1 dose of Td every 10 years.  Varicella vaccine.** / Consult your health care provider.  Zoster vaccine.** / 1 dose for adults aged 1 years or older.  Pneumococcal 13-valent conjugate (PCV13) vaccine.** / Consult  your health care provider.  Pneumococcal polysaccharide (PPSV23) vaccine.** / 1 dose for all adults aged 19 years and older.  Meningococcal vaccine.** / Consult your health care provider.  Hepatitis A vaccine.** / Consult your health care provider.  Hepatitis B vaccine.** / Consult your health care provider.  Haemophilus influenzae type b (Hib) vaccine.** / Consult your health care provider. ** Family history and personal history of risk and conditions may change your health care provider's recommendations. Document Released: 10/09/2001 Document Revised: 12/28/2013 Document Reviewed: 01/08/2011 [facility] Patient Information 2015 [place], [place]. This information is not intended to replace advice given to you by your health care provider. Make sure you discuss any questions you have with your health care provider.

## 2015-02-04 NOTE — Progress Notes (Signed)
Pre visit review using our clinic review tool, if applicable. No additional management support is needed unless otherwise documented below in the visit note. 

## 2015-02-13 ENCOUNTER — Encounter: Payer: Self-pay | Admitting: Family Medicine

## 2015-02-13 DIAGNOSIS — Z0000 Encounter for general adult medical examination without abnormal findings: Secondary | ICD-10-CM | POA: Insufficient documentation

## 2015-02-13 NOTE — Assessment & Plan Note (Signed)
minimize simple carbs. Increase exercise as tolerated.  

## 2015-02-13 NOTE — Assessment & Plan Note (Signed)
Doing well uses Xanax intermittently with good results. She would like to d/c Sertraline, if her use of Alprazolam increases she will need to restart SSRI

## 2015-02-13 NOTE — Assessment & Plan Note (Signed)
She declines pap

## 2015-02-13 NOTE — Assessment & Plan Note (Signed)
Patient denies any difficulties at home. No trouble with ADLs, depression or falls. No recent changes to vision or hearing. Is UTD with immunizations. Is UTD with screening. Discussed Advanced Directives, patient agrees to bring [place] copies of documents if can. Encouraged heart healthy diet, exercise as tolerated and adequate sleep Gastroenterology, Dr Collene Mares, colonoscopy 2006 declines further colonoscopies MGM 06/15/2013 Declines pap See problem list for risk factors.  See AVS for preventative health screening.

## 2015-02-13 NOTE — Assessment & Plan Note (Signed)
Encouraged DASH diet, decrease po intake and increase exercise as tolerated. Needs 7-8 hours of sleep nightly. Avoid trans fats, eat small, frequent meals every 4-5 hours with lean proteins, complex carbs and healthy fats. Minimize simple carbs, GMO foods. 

## 2015-02-13 NOTE — Assessment & Plan Note (Signed)
Well controlled, no changes to meds. Encouraged heart healthy diet such as the DASH diet and exercise as tolerated.  °

## 2015-02-13 NOTE — Progress Notes (Signed)
Christina [name]  [id number] June 05, 1941 02/13/2015      Progress Note-Follow Up  Subjective  Chief Complaint  Chief Complaint  Patient presents with  . Annual Exam    HPI  Patient is a 74 y.o. female in today for routine medical care. Patient is in today for annual exam and to discuss numerous concerns. Overall she is doing well but she is having intermittent trouble with heartburn. Does use times daily at bedtime with good results. Uses omeprazole very infrequently. She feels her mood is improved and she does not feel she needs sertraline any longer. She uses alprazolam when necessary with adequate results. She reports no recent illness. She declines further colonoscopy despite the fact that it's been 10 years. She denies any GI concerns. She has been trying to maintain a heart healthy diet. Manages her activities of daily living well at home Denies CP/palp/SOB/HA/congestion/fevers/GI or GU c/o. Taking meds as prescribed  Past Medical History  Diagnosis Date  . Vertigo   . Chicken pox as a child  . Measles as a child  . Mumps as a child  . Asthma     a little?  [name] Hypertension   . Urinary frequency 09/28/2011  . Cervical cancer screening 01/31/2012  . Dysphagia 01/31/2012  . Routine health maintenance 01/31/2012  . Sinusitis, acute 07/10/2012  . Hiatal hernia with gastroesophageal reflux 10/26/2010    Qualifier: Diagnosis of  By: [name] MD, [name]    . Dermatitis of external ear 07/11/2012  . Neck pain on left side 06/21/2013  . Hyperglycemia 06/21/2013  . Depression with anxiety 06/07/2009    Qualifier: Diagnosis of  By: [name] MD, [name]    . Preventative health care 01/31/2012    Past Surgical History  Procedure Laterality Date  . Breast biopsy  1960, 70's  . Tubal ligation  1970  . Cystectomy      abdomen    Family History  Problem Relation Age of Onset  . Cancer Mother 34    lung- smoker, ovarian  . Alcohol abuse Father   . Cancer Sister     ovarian  .  Diabetes Sister   . Hypertension Sister   . Other Maternal Grandmother     pacemaker  . Multiple sclerosis Maternal Grandfather     ?    History   Social History  . Marital Status: Widowed    Spouse Name: N/A  . Number of Children: N/A  . Years of Education: N/A   Occupational History  . Not on file.   Social History Main Topics  . Smoking status: Former Smoker -- 1.00 packs/day for 30 years    Types: Cigarettes    Quit date: 08/27/1990  . Smokeless tobacco: Never Used  . Alcohol Use: No  . Drug Use: No  . Sexual Activity: Not on file     Comment: lives alone, no dietary restrictions   Other Topics Concern  . Not on file   Social History Narrative    Current Outpatient Prescriptions on File Prior to Visit  Medication Sig Dispense Refill  . fish oil-omega-3 fatty acids 1000 MG capsule Take 1 g by mouth daily.      . Calcium Carbonate (CALCIUM 600 PO) Take by mouth.       No current facility-administered medications on file prior to visit.    Allergies  Allergen Reactions  . Hydrochlorothiazide     REACTION: cough  . Lisinopril     REACTION: cough  Review of Systems  Review of Systems  Constitutional: Negative for fever and malaise/fatigue.  HENT: Negative for congestion.   Eyes: Negative for discharge.  Respiratory: Negative for shortness of breath.   Cardiovascular: Negative for chest pain, palpitations and leg swelling.  Gastrointestinal: Positive for heartburn. Negative for nausea, abdominal pain and diarrhea.  Genitourinary: Negative for dysuria.  Musculoskeletal: Negative for falls.  Skin: Negative for rash.  Neurological: Negative for loss of consciousness and headaches.  Endo/Heme/Allergies: Negative for polydipsia.  Psychiatric/Behavioral: Positive for depression. Negative for suicidal ideas. The patient is nervous/anxious. The patient does not have insomnia.     Objective  BP 122/62 mmHg  Pulse 65  Temp(Src) 97.9 F (36.6 C) (Oral)   Ht 5' 5.5" (1.664 m)  Wt 179 lb 9 oz (81.449 kg)  BMI 29.42 kg/m2  SpO2 94%  Physical Exam  Physical Exam  Constitutional: She is oriented to person, place, and time and well-developed, well-nourished, and in no distress. No distress.  HENT:  Head: Normocephalic and atraumatic.  Right Ear: External ear normal.  Left Ear: External ear normal.  Nose: Nose normal.  Mouth/Throat: Oropharynx is clear and moist. No oropharyngeal exudate.  Eyes: Conjunctivae are normal. Pupils are equal, round, and reactive to light. Right eye exhibits no discharge. Left eye exhibits no discharge. No scleral icterus.  Neck: Normal range of motion. Neck supple. No thyromegaly present.  Cardiovascular: Normal rate, regular rhythm, normal heart sounds and intact distal pulses.   No murmur heard. Pulmonary/Chest: Effort normal and breath sounds normal. No respiratory distress. She has no wheezes. She has no rales.  Abdominal: Soft. Bowel sounds are normal. She exhibits no distension and no mass. There is no tenderness.  Musculoskeletal: Normal range of motion. She exhibits no edema or tenderness.  Lymphadenopathy:    She has no cervical adenopathy.  Neurological: She is alert and oriented to person, place, and time. She has normal reflexes. No cranial nerve deficit. Coordination normal.  Skin: Skin is warm and dry. No rash noted. She is not diaphoretic.  Psychiatric: Mood, memory and affect normal.    Lab Results  Component Value Date   TSH 1.41 02/04/2015   Lab Results  Component Value Date   WBC 5.5 02/04/2015   HGB 14.4 02/04/2015   HCT 43.4 02/04/2015   MCV 94.7 02/04/2015   PLT 210.0 02/04/2015   Lab Results  Component Value Date   CREATININE 0.97 02/04/2015   BUN 18 02/04/2015   NA 139 02/04/2015   K 4.0 02/04/2015   CL 104 02/04/2015   CO2 29 02/04/2015   Lab Results  Component Value Date   ALT 12 02/04/2015   AST 21 02/04/2015   ALKPHOS 67 02/04/2015   BILITOT 1.2 02/04/2015    Lab Results  Component Value Date   CHOL 199 02/04/2015   Lab Results  Component Value Date   HDL 59.70 02/04/2015   Lab Results  Component Value Date   LDLCALC 126* 02/04/2015   Lab Results  Component Value Date   TRIG 69.0 02/04/2015   Lab Results  Component Value Date   CHOLHDL 3 02/04/2015     Assessment & Plan  Essential hypertension, benign Well controlled, no changes to meds. Encouraged heart healthy diet such as the DASH diet and exercise as tolerated.   Hiatal hernia with gastroesophageal reflux Avoid offending foods, start probiotics. Do not eat large meals in late evening and consider raising head of bed. Continue Omeprazole prn  Obesity Encouraged DASH  diet, decrease po intake and increase exercise as tolerated. Needs 7-8 hours of sleep nightly. Avoid trans fats, eat small, frequent meals every 4-5 hours with lean proteins, complex carbs and healthy fats. Minimize simple carbs, GMO foods.  Depression with anxiety Doing well uses Xanax intermittently with good results. She would like to d/c Sertraline, if her use of Alprazolam increases she will need to restart SSRI   Cervical cancer screening She declines pap  Hyperglycemia  minimize simple carbs. Increase exercise as tolerated.   Preventative health care Patient encouraged to maintain heart healthy diet, regular exercise, adequate sleep. Consider daily probiotics. Take medications as prescribed. Declines referral back to gastroenterology for screening colonoscopy but agrees to Cologuard. Declines pap, last MGM 2014, no breast concerns  Medicare annual wellness visit, subsequent Patient denies any difficulties at home. No trouble with ADLs, depression or falls. No recent changes to vision or hearing. Is UTD with immunizations. Is UTD with screening. Discussed Advanced Directives, patient agrees to bring [place] copies of documents if can. Encouraged heart healthy diet, exercise as tolerated and adequate  sleep Gastroenterology, Dr Collene Mares, colonoscopy 2006 declines further colonoscopies MGM 06/15/2013 Declines pap See problem list for risk factors.  See AVS for preventative health screening.

## 2015-02-13 NOTE — Assessment & Plan Note (Signed)
Avoid offending foods, start probiotics. Do not eat large meals in late evening and consider raising head of bed. Continue Omeprazole prn

## 2015-02-13 NOTE — Assessment & Plan Note (Signed)
Patient encouraged to maintain heart healthy diet, regular exercise, adequate sleep. Consider daily probiotics. Take medications as prescribed. Declines referral back to gastroenterology for screening colonoscopy but agrees to Cologuard. Declines pap, last MGM 2014, no breast concerns

## 2015-02-15 LAB — COLOGUARD

## 2015-02-16 ENCOUNTER — Other Ambulatory Visit: Payer: Self-pay | Admitting: Family Medicine

## 2015-02-16 LAB — COLOGUARD: Cologuard: NEGATIVE

## 2015-03-12 ENCOUNTER — Other Ambulatory Visit: Payer: Self-pay | Admitting: Family Medicine

## 2015-03-13 NOTE — Telephone Encounter (Signed)
Please see if she wants to start this again. It is off of her list. THX

## 2015-03-14 NOTE — Telephone Encounter (Signed)
Sent in as she does.

## 2015-04-11 ENCOUNTER — Telehealth: Payer: Self-pay | Admitting: Family Medicine

## 2015-04-11 NOTE — Telephone Encounter (Signed)
Relation to FB:XUXY Call back number: [phone]   Reason for call:  Patient stated she mailed stool sample on 03/20/15 and inquiring about results

## 2015-04-11 NOTE — Telephone Encounter (Signed)
[facility] and they had faxed her results the end of July.  They will fax again to my attention.  The patients results were negative and did call the patient to inform and once received paper results will mail a copy to the patients home.

## 2015-04-12 NOTE — Telephone Encounter (Signed)
Received results from [facility] stating NEGATIVE.  Sent results to [facility] and mailed a copy to the patients home as I promised her I would do.

## 2015-05-06 ENCOUNTER — Encounter: Payer: Self-pay | Admitting: Family Medicine

## 2015-05-20 ENCOUNTER — Encounter: Payer: Self-pay | Admitting: Family Medicine

## 2015-05-20 ENCOUNTER — Ambulatory Visit (INDEPENDENT_AMBULATORY_CARE_PROVIDER_SITE_OTHER): Payer: Medicare Other | Admitting: Family Medicine

## 2015-05-20 ENCOUNTER — Ambulatory Visit (HOSPITAL_BASED_OUTPATIENT_CLINIC_OR_DEPARTMENT_OTHER)
Admission: RE | Admit: 2015-05-20 | Discharge: 2015-05-20 | Disposition: A | Discharge status: Home / Self Care | Payer: Medicare Other | Source: Ambulatory Visit | Attending: Family Medicine | Admitting: Family Medicine

## 2015-05-20 VITALS — BP 130/82 | HR 70 | Temp 97.7°F | Resp 18

## 2015-05-20 DIAGNOSIS — M545 Low back pain: Secondary | ICD-10-CM

## 2015-05-20 DIAGNOSIS — M47896 Other spondylosis, lumbar region: Secondary | ICD-10-CM | POA: Diagnosis not present

## 2015-05-20 DIAGNOSIS — F418 Other specified anxiety disorders: Secondary | ICD-10-CM

## 2015-05-20 DIAGNOSIS — Z23 Encounter for immunization: Secondary | ICD-10-CM

## 2015-05-20 DIAGNOSIS — R29898 Other symptoms and signs involving the musculoskeletal system: Secondary | ICD-10-CM

## 2015-05-20 DIAGNOSIS — R1011 Right upper quadrant pain: Secondary | ICD-10-CM

## 2015-05-20 DIAGNOSIS — I10 Essential (primary) hypertension: Secondary | ICD-10-CM

## 2015-05-20 DIAGNOSIS — E669 Obesity, unspecified: Secondary | ICD-10-CM

## 2015-05-20 DIAGNOSIS — G609 Hereditary and idiopathic neuropathy, unspecified: Secondary | ICD-10-CM

## 2015-05-20 DIAGNOSIS — R110 Nausea: Secondary | ICD-10-CM

## 2015-05-20 LAB — CBC
HCT: 41.9 % (ref 36.0–46.0)
Hemoglobin: 14.3 g/dL (ref 12.0–15.0)
MCH: 31.9 pg (ref 26.0–34.0)
MCHC: 34.1 g/dL (ref 30.0–36.0)
MCV: 93.5 fL (ref 78.0–100.0)
MPV: 10.3 fL (ref 8.6–12.4)
Platelets: 247 10*3/uL (ref 150–400)
RBC: 4.48 MIL/uL (ref 3.87–5.11)
RDW: 14 % (ref 11.5–15.5)
WBC: 6.4 10*3/uL (ref 4.0–10.5)

## 2015-05-20 LAB — COMPREHENSIVE METABOLIC PANEL
ALT: 14 U/L (ref 6–29)
AST: 20 U/L (ref 10–35)
Albumin: 4.2 g/dL (ref 3.6–5.1)
Alkaline Phosphatase: 69 U/L (ref 33–130)
BUN: 15 mg/dL (ref 7–25)
CO2: 26 mmol/L (ref 20–31)
Calcium: 10 mg/dL (ref 8.6–10.4)
Chloride: 105 mmol/L (ref 98–110)
Creat: 0.78 mg/dL (ref 0.60–0.93)
Glucose, Bld: 96 mg/dL (ref 65–99)
Potassium: 4 mmol/L (ref 3.5–5.3)
Sodium: 140 mmol/L (ref 135–146)
Total Bilirubin: 1 mg/dL (ref 0.2–1.2)
Total Protein: 6.8 g/dL (ref 6.1–8.1)

## 2015-05-20 LAB — SEDIMENTATION RATE: Sed Rate: 1 mm/hr (ref 0–30)

## 2015-05-20 LAB — VITAMIN B12: Vitamin B-12: 510 pg/mL (ref 211–911)

## 2015-05-20 LAB — TSH: TSH: 0.621 u[IU]/mL (ref 0.350–4.500)

## 2015-05-20 MED ORDER — GABAPENTIN 100 MG PO CAPS: ORAL_CAPSULE | ORAL | Status: DC

## 2015-05-20 NOTE — Patient Instructions (Signed)

## 2015-05-20 NOTE — Progress Notes (Signed)
Pre visit review using our clinic review tool, if applicable. No additional management support is needed unless otherwise documented below in the visit note. 

## 2015-05-21 LAB — URINALYSIS
Bilirubin Urine: NEGATIVE
Glucose, UA: NEGATIVE
Hgb urine dipstick: NEGATIVE
Ketones, ur: NEGATIVE
Nitrite: NEGATIVE
Protein, ur: NEGATIVE
Specific Gravity, Urine: 1.01 (ref 1.001–1.035)
pH: 6.5 (ref 5.0–8.0)

## 2015-05-22 LAB — URINE CULTURE: Colony Count: 7000

## 2015-05-23 ENCOUNTER — Telehealth: Payer: Self-pay | Admitting: Family Medicine

## 2015-05-23 ENCOUNTER — Other Ambulatory Visit: Payer: Self-pay | Admitting: Family Medicine

## 2015-05-23 DIAGNOSIS — M5440 Lumbago with sciatica, unspecified side: Secondary | ICD-10-CM

## 2015-05-23 NOTE — Telephone Encounter (Signed)
Patient contacted already.

## 2015-05-23 NOTE — Telephone Encounter (Signed)
Relation to pt: self  Call back [phone]   Reason for call:  Patient states she received a my chart alert and would like to know if its pertaining to her MRI orders.

## 2015-05-28 ENCOUNTER — Ambulatory Visit (HOSPITAL_BASED_OUTPATIENT_CLINIC_OR_DEPARTMENT_OTHER)
Admission: RE | Admit: 2015-05-28 | Discharge: 2015-05-28 | Disposition: A | Discharge status: Home / Self Care | Payer: Medicare Other | Source: Ambulatory Visit | Attending: Family Medicine | Admitting: Family Medicine

## 2015-05-28 DIAGNOSIS — M5440 Lumbago with sciatica, unspecified side: Secondary | ICD-10-CM

## 2015-05-28 DIAGNOSIS — M545 Low back pain: Secondary | ICD-10-CM | POA: Diagnosis present

## 2015-05-28 DIAGNOSIS — M4696 Unspecified inflammatory spondylopathy, lumbar region: Secondary | ICD-10-CM | POA: Diagnosis not present

## 2015-05-28 DIAGNOSIS — M4806 Spinal stenosis, lumbar region: Secondary | ICD-10-CM | POA: Insufficient documentation

## 2015-05-29 ENCOUNTER — Encounter: Payer: Self-pay | Admitting: Family Medicine

## 2015-05-29 DIAGNOSIS — I716 Thoracoabdominal aortic aneurysm, without rupture, unspecified: Secondary | ICD-10-CM | POA: Insufficient documentation

## 2015-05-29 DIAGNOSIS — R1011 Right upper quadrant pain: Secondary | ICD-10-CM

## 2015-05-29 DIAGNOSIS — M545 Low back pain, unspecified: Secondary | ICD-10-CM

## 2015-05-29 HISTORY — DX: Right upper quadrant pain: R10.11

## 2015-05-29 HISTORY — DX: Low back pain, unspecified: M54.50

## 2015-05-29 NOTE — Assessment & Plan Note (Signed)
She feels she is doing well without sertraline, may use Alprazolam prn infrequently

## 2015-05-29 NOTE — Assessment & Plan Note (Signed)
Well controlled, no changes to meds. Encouraged heart healthy diet such as the DASH diet and exercise as tolerated.  °

## 2015-05-29 NOTE — Assessment & Plan Note (Signed)
Encouraged topical treatments. Proceed with xray, moist heat, report if worse

## 2015-05-29 NOTE — Assessment & Plan Note (Signed)
Encouraged DASH diet, decrease po intake and increase exercise as tolerated. Needs 7-8 hours of sleep nightly. Avoid trans fats, eat small, frequent meals every 4-5 hours with lean proteins, complex carbs and healthy fats. Minimize simple carbs, GMO foods. 

## 2015-05-29 NOTE — Progress Notes (Signed)
Subjective:    Patient ID: Christina [name], female    DOB: 1941/03/21, 74 y.o.   MRN: [id number]  Chief Complaint  Patient presents with  . Follow-up  . Leg Pain    states legs feel heavy, having trouble sleeping due to pain, equal in both legs, hands tingling     HPI Patient is in today for patient is in today for follow-up. Is complaining of some intermittent right upper quadrant pain for several months. Notes it is sharp and often while she is sitting at the computer. She's also noting the last few days a sense of burning in her feet most notably along her first metatarsal and a sense of her legs feeling heavy and uncomfortable at bedtime. Not noted with ambulation. She's having some swelling in her hands and tingling in the mornings as well. No recent febrile illness. Denies CP/palp/SOB/HA/congestion/fevers/GI or GU c/o. Taking meds as prescribed  Past Medical History  Diagnosis Date  . Vertigo   . Chicken pox as a child  . Measles as a child  . Mumps as a child  . Asthma     a little?  [name] Hypertension   . Urinary frequency 09/28/2011  . Cervical cancer screening 01/31/2012  . Dysphagia 01/31/2012  . Routine health maintenance 01/31/2012  . Sinusitis, acute 07/10/2012  . Hiatal hernia with gastroesophageal reflux 10/26/2010    Qualifier: Diagnosis of  By: [name] MD, [name]    . Dermatitis of external ear 07/11/2012  . Neck pain on left side 06/21/2013  . Hyperglycemia 06/21/2013  . Depression with anxiety 06/07/2009    Qualifier: Diagnosis of  By: [name] MD, [name]    . Preventative health care 01/31/2012  . RUQ pain 05/29/2015  . Low back pain 05/29/2015    Past Surgical History  Procedure Laterality Date  . Breast biopsy  1960, 70's  . Tubal ligation  1970  . Cystectomy      abdomen    Family History  Problem Relation Age of Onset  . Cancer Mother 37    lung- smoker, ovarian  . Alcohol abuse Father   . Cancer Sister     ovarian  . Diabetes Sister   .  Hypertension Sister   . Other Maternal Grandmother     pacemaker  . Multiple sclerosis Maternal Grandfather     ?    Social History   Social History  . Marital Status: Widowed    Spouse Name: N/A  . Number of Children: N/A  . Years of Education: N/A   Occupational History  . Not on file.   Social History Main Topics  . Smoking status: Former Smoker -- 1.00 packs/day for 30 years    Types: Cigarettes    Quit date: 08/27/1990  . Smokeless tobacco: Never Used  . Alcohol Use: No  . Drug Use: No  . Sexual Activity: Not on file     Comment: lives alone, no dietary restrictions   Other Topics Concern  . Not on file   Social History Narrative    Outpatient Prescriptions Prior to Visit  Medication Sig Dispense Refill  . ALPRAZolam (XANAX) 0.5 MG tablet Take 0.5-1 tablets (0.25-0.5 mg total) by mouth 2 (two) times daily as needed for anxiety or sleep. 30 tablet 1  . fish oil-omega-3 fatty acids 1000 MG capsule Take 1 g by mouth daily.      [name] losartan (COZAAR) 50 MG tablet Take 1 tablet (50 mg total) by mouth daily. [place]  tablet 3  . Calcium Carbonate (CALCIUM 600 PO) Take by mouth.      . losartan (COZAAR) 50 MG tablet TAKE 1 TABLET BY MOUTH EVERY DAY 90 tablet 0  . sertraline (ZOLOFT) 50 MG tablet Take 0.5 tablets (25 mg total) by mouth daily. (Patient not taking: Reported on 05/20/2015) 30 tablet 0  . sertraline (ZOLOFT) 50 MG tablet TAKE 1.5 TABLETS (75 MG TOTAL) BY MOUTH DAILY. (Patient not taking: Reported on 05/20/2015) 45 tablet 1   No facility-administered medications prior to visit.    Allergies  Allergen Reactions  . Hydrochlorothiazide     REACTION: cough  . Lisinopril     REACTION: cough    Review of Systems  Constitutional: Negative for fever and malaise/fatigue.  HENT: Negative for congestion.   Eyes: Negative for discharge.  Respiratory: Negative for shortness of breath.   Cardiovascular: Negative for chest pain, palpitations and leg swelling.    Gastrointestinal: Positive for abdominal pain. Negative for nausea.  Genitourinary: Negative for dysuria.  Musculoskeletal: Positive for back pain. Negative for falls.  Skin: Negative for rash.  Neurological: Positive for tingling and focal weakness. Negative for loss of consciousness and headaches.  Endo/Heme/Allergies: Negative for environmental allergies.  Psychiatric/Behavioral: Negative for depression. The patient is not nervous/anxious.        Objective:    Physical Exam  Constitutional: She is oriented to person, place, and time. She appears well-developed and well-nourished. No distress.  HENT:  Head: Normocephalic and atraumatic.  Nose: Nose normal.  Eyes: Right eye exhibits no discharge. Left eye exhibits no discharge.  Neck: Normal range of motion. Neck supple.  Cardiovascular: Normal rate and regular rhythm.   No murmur heard. Pulmonary/Chest: Effort normal and breath sounds normal.  Abdominal: Soft. Bowel sounds are normal. She exhibits no mass. There is tenderness. There is no rebound and no guarding.  Musculoskeletal: She exhibits no edema.  Neurological: She is alert and oriented to person, place, and time.  Skin: Skin is warm and dry.  Psychiatric: She has a normal mood and affect.  Nursing note and vitals reviewed.   BP 130/82 mmHg  Pulse 70  Temp(Src) 97.7 F (36.5 C) (Oral)  Resp 18  Wt   SpO2 97% Wt Readings from Last 3 Encounters:  02/04/15 179 lb 9 oz (81.449 kg)  09/01/14 182 lb 8 oz (82.781 kg)  05/31/14 183 lb 12.8 oz (83.371 kg)     Lab Results  Component Value Date   WBC 6.4 05/20/2015   HGB 14.3 05/20/2015   HCT 41.9 05/20/2015   PLT 247 05/20/2015   GLUCOSE 96 05/20/2015   CHOL 199 02/04/2015   TRIG 69.0 02/04/2015   HDL 59.70 02/04/2015   LDLCALC 126* 02/04/2015   ALT 14 05/20/2015   AST 20 05/20/2015   NA 140 05/20/2015   K 4.0 05/20/2015   CL 105 05/20/2015   CREATININE 0.78 05/20/2015   BUN 15 05/20/2015   CO2 26  05/20/2015   TSH 0.621 05/20/2015   HGBA1C 5.7 02/04/2015    Lab Results  Component Value Date   TSH 0.621 05/20/2015   Lab Results  Component Value Date   WBC 6.4 05/20/2015   HGB 14.3 05/20/2015   HCT 41.9 05/20/2015   MCV 93.5 05/20/2015   PLT 247 05/20/2015   Lab Results  Component Value Date   NA 140 05/20/2015   K 4.0 05/20/2015   CO2 26 05/20/2015   GLUCOSE 96 05/20/2015   BUN 15 05/20/2015  CREATININE 0.78 05/20/2015   BILITOT 1.0 05/20/2015   ALKPHOS 69 05/20/2015   AST 20 05/20/2015   ALT 14 05/20/2015   PROT 6.8 05/20/2015   ALBUMIN 4.2 05/20/2015   CALCIUM 10.0 05/20/2015   GFR 59.81* 02/04/2015   Lab Results  Component Value Date   CHOL 199 02/04/2015   Lab Results  Component Value Date   HDL 59.70 02/04/2015   Lab Results  Component Value Date   LDLCALC 126* 02/04/2015   Lab Results  Component Value Date   TRIG 69.0 02/04/2015   Lab Results  Component Value Date   CHOLHDL 3 02/04/2015   Lab Results  Component Value Date   HGBA1C 5.7 02/04/2015       Assessment & Plan:   Problem List Items Addressed This Visit    RUQ pain    Off and on for months, sharp especially when at the computer. Likely musculoskelatal but check abdominal ultrasound.       Relevant Orders   DG Lumbar Spine Complete (Completed)   US Abdomen Complete   CBC (Completed)   TSH (Completed)   Comp Met (CMET) (Completed)   Vitamin B12 (Completed)   Sed Rate (ESR) (Completed)   Urinalysis (Completed)   Urine culture (Completed)   Obesity    Encouraged DASH diet, decrease po intake and increase exercise as tolerated. Needs 7-8 hours of sleep nightly. Avoid trans fats, eat small, frequent meals every 4-5 hours with lean proteins, complex carbs and healthy fats. Minimize simple carbs, GMO foods.      Low back pain    Encouraged topical treatments. Proceed with xray, moist heat, report if worse      Relevant Orders   DG Lumbar Spine Complete (Completed)    US Abdomen Complete   CBC (Completed)   TSH (Completed)   Comp Met (CMET) (Completed)   Vitamin B12 (Completed)   Sed Rate (ESR) (Completed)   Urinalysis (Completed)   Urine culture (Completed)   Essential hypertension, benign    Well controlled, no changes to meds. Encouraged heart healthy diet such as the DASH diet and exercise as tolerated.       Depression with anxiety    She feels she is doing well without sertraline, may use Alprazolam prn infrequently       Other Visit Diagnoses    Need for influenza vaccination    -  Primary    Relevant Orders    Flu vaccine HIGH DOSE PF (Fluzone High dose) (Completed)    DG Lumbar Spine Complete (Completed)    US Abdomen Complete    CBC (Completed)    TSH (Completed)    Comp Met (CMET) (Completed)    Vitamin B12 (Completed)    Sed Rate (ESR) (Completed)    Urinalysis (Completed)    Urine culture (Completed)    Weakness of both legs        Relevant Orders    DG Lumbar Spine Complete (Completed)    US Abdomen Complete    CBC (Completed)    TSH (Completed)    Comp Met (CMET) (Completed)    Vitamin B12 (Completed)    Sed Rate (ESR) (Completed)    Urinalysis (Completed)    Urine culture (Completed)    Hereditary and idiopathic peripheral neuropathy        Relevant Medications    gabapentin (NEURONTIN) 100 MG capsule    Other Relevant Orders    DG Lumbar Spine Complete (Completed)    US Abdomen  Complete    CBC (Completed)    TSH (Completed)    Comp Met (CMET) (Completed)    Vitamin B12 (Completed)    Sed Rate (ESR) (Completed)    Urinalysis (Completed)    Urine culture (Completed)    Nausea        Relevant Orders    DG Lumbar Spine Complete (Completed)    US Abdomen Complete    CBC (Completed)    TSH (Completed)    Comp Met (CMET) (Completed)    Vitamin B12 (Completed)    Sed Rate (ESR) (Completed)    Urinalysis (Completed)    Urine culture (Completed)       I have discontinued [name] sertraline and  sertraline. I am also having her start on gabapentin. Additionally, I am having her maintain her fish oil-omega-3 fatty acids, Calcium Carbonate (CALCIUM 600 PO), losartan, and ALPRAZolam.  Meds ordered this encounter  Medications  . gabapentin (NEURONTIN) 100 MG capsule    Sig: 1 cap po qhs x 1 day then 2 caps po qhs x 1 day then 3 caps po qhs as needed and as tolerated    Dispense:  90 capsule    Refill:  3     Penni Homans, MD

## 2015-05-29 NOTE — Assessment & Plan Note (Signed)
Off and on for months, sharp especially when at the computer. Likely musculoskelatal but check abdominal ultrasound.

## 2015-05-30 ENCOUNTER — Ambulatory Visit (HOSPITAL_BASED_OUTPATIENT_CLINIC_OR_DEPARTMENT_OTHER)
Admission: RE | Admit: 2015-05-30 | Discharge: 2015-05-30 | Disposition: A | Discharge status: Home / Self Care | Payer: Medicare Other | Source: Ambulatory Visit | Attending: Family Medicine | Admitting: Family Medicine

## 2015-05-30 DIAGNOSIS — G609 Hereditary and idiopathic neuropathy, unspecified: Secondary | ICD-10-CM

## 2015-05-30 DIAGNOSIS — Z23 Encounter for immunization: Secondary | ICD-10-CM

## 2015-05-30 DIAGNOSIS — R1011 Right upper quadrant pain: Secondary | ICD-10-CM

## 2015-05-30 DIAGNOSIS — R1013 Epigastric pain: Secondary | ICD-10-CM | POA: Diagnosis not present

## 2015-05-30 DIAGNOSIS — R29898 Other symptoms and signs involving the musculoskeletal system: Secondary | ICD-10-CM

## 2015-05-30 DIAGNOSIS — M545 Low back pain: Secondary | ICD-10-CM

## 2015-05-30 DIAGNOSIS — R110 Nausea: Secondary | ICD-10-CM

## 2015-05-30 NOTE — Telephone Encounter (Signed)
Patient returning your call.

## 2015-06-27 ENCOUNTER — Encounter: Payer: Self-pay | Admitting: Family Medicine

## 2015-06-27 ENCOUNTER — Ambulatory Visit (INDEPENDENT_AMBULATORY_CARE_PROVIDER_SITE_OTHER): Payer: Medicare Other | Admitting: Family Medicine

## 2015-06-27 VITALS — BP 125/83 | HR 75 | Temp 98.0°F | Ht 65.0 in | Wt 180.0 lb

## 2015-06-27 DIAGNOSIS — E663 Overweight: Secondary | ICD-10-CM | POA: Diagnosis not present

## 2015-06-27 DIAGNOSIS — F418 Other specified anxiety disorders: Secondary | ICD-10-CM

## 2015-06-27 DIAGNOSIS — R739 Hyperglycemia, unspecified: Secondary | ICD-10-CM

## 2015-06-27 DIAGNOSIS — R350 Frequency of micturition: Secondary | ICD-10-CM | POA: Diagnosis not present

## 2015-06-27 DIAGNOSIS — M5442 Lumbago with sciatica, left side: Secondary | ICD-10-CM

## 2015-06-27 DIAGNOSIS — I10 Essential (primary) hypertension: Secondary | ICD-10-CM

## 2015-06-27 DIAGNOSIS — M25552 Pain in left hip: Secondary | ICD-10-CM

## 2015-06-27 MED ORDER — ALPRAZOLAM 0.5 MG PO TABS: 0.2500 mg | ORAL_TABLET | Freq: Two times a day (BID) | ORAL | Status: DC | PRN

## 2015-06-27 MED ORDER — METHYLPREDNISOLONE 4 MG PO TABS: ORAL_TABLET | ORAL | Status: DC

## 2015-06-27 MED ORDER — CYCLOBENZAPRINE HCL 10 MG PO TABS: 10.0000 mg | ORAL_TABLET | Freq: Three times a day (TID) | ORAL | Status: DC | PRN

## 2015-06-27 NOTE — Progress Notes (Signed)
Pre visit review using our clinic review tool, if applicable. No additional management support is needed unless otherwise documented below in the visit note. 

## 2015-06-27 NOTE — Patient Instructions (Signed)

## 2015-06-28 LAB — URINALYSIS
Bilirubin Urine: NEGATIVE
Hgb urine dipstick: NEGATIVE
Ketones, ur: NEGATIVE
Leukocytes, UA: NEGATIVE
Nitrite: NEGATIVE
Specific Gravity, Urine: 1.015 (ref 1.000–1.030)
Total Protein, Urine: NEGATIVE
Urine Glucose: NEGATIVE
Urobilinogen, UA: 0.2 (ref 0.0–1.0)
pH: 7 (ref 5.0–8.0)

## 2015-06-28 LAB — URINE CULTURE: Colony Count: 7000

## 2015-07-10 ENCOUNTER — Encounter: Payer: Self-pay | Admitting: Family Medicine

## 2015-07-10 DIAGNOSIS — M25552 Pain in left hip: Secondary | ICD-10-CM | POA: Insufficient documentation

## 2015-07-10 HISTORY — DX: Pain in left hip: M25.552

## 2015-07-10 NOTE — Assessment & Plan Note (Signed)
Encouraged DASH diet, decrease po intake and increase exercise as tolerated. Needs 7-8 hours of sleep nightly. Avoid trans fats, eat small, frequent meals every 4-5 hours with lean proteins, complex carbs and healthy fats. Minimize simple carbs 

## 2015-07-10 NOTE — Assessment & Plan Note (Signed)
minimize simple carbs. Increase exercise as tolerated.  

## 2015-07-10 NOTE — Assessment & Plan Note (Addendum)
Encouraged moist heat and gentle stretching as tolerated. May try NSAIDs and prescription meds as directed and report if symptoms worsen or seek immediate care. Topical treatments encouraged. Stay as active as tolerated. Referred to orthopaedics for further evaluation

## 2015-07-10 NOTE — Assessment & Plan Note (Signed)
Well controlled, no changes to meds. Encouraged heart healthy diet such as the DASH diet and exercise as tolerated.  °

## 2015-07-10 NOTE — Progress Notes (Signed)
Subjective:    Patient ID: Christina [name], female    DOB: 1941-04-24, 74 y.o.   MRN: [id number]  Chief Complaint  Patient presents with  . Follow-up    MRI    HPI Patient is in today for follow-up. She continues to struggle with low back and left hip pain. She notes that her pain is improved when she's sitting. No incontinence or recent injury. No falls. No recent illness or acute concerns otherwise noted. Denies CP/palp/SOB/HA/congestion/fevers/GI or GU c/o. Taking meds as prescribed  Past Medical History  Diagnosis Date  . Vertigo   . Chicken pox as a child  . Measles as a child  . Mumps as a child  . Asthma     a little?  [name] Hypertension   . Urinary frequency 09/28/2011  . Cervical cancer screening 01/31/2012  . Dysphagia 01/31/2012  . Routine health maintenance 01/31/2012  . Sinusitis, acute 07/10/2012  . Hiatal hernia with gastroesophageal reflux 10/26/2010    Qualifier: Diagnosis of  By: [name] MD, [name]    . Dermatitis of external ear 07/11/2012  . Neck pain on left side 06/21/2013  . Hyperglycemia 06/21/2013  . Depression with anxiety 06/07/2009    Qualifier: Diagnosis of  By: [name] MD, [name]    . Preventative health care 01/31/2012  . RUQ pain 05/29/2015  . Low back pain 05/29/2015  . Overweight (BMI 25.0-29.9) 10/07/2008    Qualifier: Diagnosis of  By: [name] MD, [name]    . Left hip pain 07/10/2015    Past Surgical History  Procedure Laterality Date  . Breast biopsy  1960, 70's  . Tubal ligation  1970  . Cystectomy      abdomen    Family History  Problem Relation Age of Onset  . Cancer Mother 62    lung- smoker, ovarian  . Alcohol abuse Father   . Cancer Sister     ovarian  . Diabetes Sister   . Hypertension Sister   . Other Maternal Grandmother     pacemaker  . Multiple sclerosis Maternal Grandfather     ?    Social History   Social History  . Marital Status: Widowed    Spouse Name: N/A  . Number of Children: N/A  . Years of  Education: N/A   Occupational History  . Not on file.   Social History Main Topics  . Smoking status: Former Smoker -- 1.00 packs/day for 30 years    Types: Cigarettes    Quit date: 08/27/1990  . Smokeless tobacco: Never Used  . Alcohol Use: No  . Drug Use: No  . Sexual Activity: Not on file     Comment: lives alone, no dietary restrictions   Other Topics Concern  . Not on file   Social History Narrative    Outpatient Prescriptions Prior to Visit  Medication Sig Dispense Refill  . fish oil-omega-3 fatty acids 1000 MG capsule Take 1 g by mouth daily.      [name] losartan (COZAAR) 50 MG tablet Take 1 tablet (50 mg total) by mouth daily. 90 tablet 3  . ALPRAZolam (XANAX) 0.5 MG tablet Take 0.5-1 tablets (0.25-0.5 mg total) by mouth 2 (two) times daily as needed for anxiety or sleep. 30 tablet 1  . Calcium Carbonate (CALCIUM 600 PO) Take by mouth.      . gabapentin (NEURONTIN) 100 MG capsule 1 cap po qhs x 1 day then 2 caps po qhs x 1 day then 3 caps po qhs  as needed and as tolerated (Patient not taking: Reported on 06/27/2015) 90 capsule 3   No facility-administered medications prior to visit.    Allergies  Allergen Reactions  . Hydrochlorothiazide     REACTION: cough  . Lisinopril     REACTION: cough    Review of Systems  Constitutional: Positive for malaise/fatigue. Negative for fever.  HENT: Negative for congestion.   Eyes: Negative for discharge.  Respiratory: Negative for shortness of breath.   Cardiovascular: Negative for chest pain, palpitations and leg swelling.  Gastrointestinal: Negative for nausea and abdominal pain.  Genitourinary: Negative for dysuria.  Musculoskeletal: Positive for joint pain. Negative for falls.  Skin: Negative for rash.  Neurological: Negative for loss of consciousness and headaches.  Endo/Heme/Allergies: Negative for environmental allergies.  Psychiatric/Behavioral: Negative for depression. The patient is not nervous/anxious.          Objective:    Physical Exam  Constitutional: She is oriented to person, place, and time. She appears well-developed and well-nourished. No distress.  HENT:  Head: Normocephalic and atraumatic.  Right Ear: External ear normal.  Left Ear: External ear normal.  Nose: Nose normal.  Mouth/Throat: Oropharynx is clear and moist.  Eyes: Conjunctivae and EOM are normal. Pupils are equal, round, and reactive to light. Right eye exhibits no discharge. Left eye exhibits no discharge.  Neck: Normal range of motion. Neck supple. No JVD present. No thyromegaly present.  Cardiovascular: Normal rate, regular rhythm, normal heart sounds and intact distal pulses.   No murmur heard. Pulmonary/Chest: Effort normal and breath sounds normal. No respiratory distress. She has no wheezes. She has no rales. She exhibits no tenderness.  Abdominal: Soft. Bowel sounds are normal. She exhibits no distension and no mass. There is no tenderness. There is no rebound and no guarding.  Genitourinary: Vagina normal and uterus normal. Guaiac negative stool. No vaginal discharge found.  Musculoskeletal: Normal range of motion. She exhibits no edema or tenderness.  Lymphadenopathy:    She has no cervical adenopathy.  Neurological: She is alert and oriented to person, place, and time. She has normal reflexes. No cranial nerve deficit.  Skin: Skin is warm and dry. No rash noted. She is not diaphoretic. No erythema.  Psychiatric: She has a normal mood and affect. Her behavior is normal. Judgment and thought content normal.  Nursing note and vitals reviewed.   BP 125/83 mmHg  Pulse 75  Temp(Src) 98 F (36.7 C) (Oral)  Ht 5\' 5"  (1.651 m)  Wt 180 lb (81.647 kg)  BMI 29.95 kg/m2  SpO2 96% Wt Readings from Last 3 Encounters:  06/27/15 180 lb (81.647 kg)  02/04/15 179 lb 9 oz (81.449 kg)  09/01/14 182 lb 8 oz (82.781 kg)     Lab Results  Component Value Date   WBC 6.4 05/20/2015   HGB 14.3 05/20/2015   HCT 41.9  05/20/2015   PLT 247 05/20/2015   GLUCOSE 96 05/20/2015   CHOL 199 02/04/2015   TRIG 69.0 02/04/2015   HDL 59.70 02/04/2015   LDLCALC 126* 02/04/2015   ALT 14 05/20/2015   AST 20 05/20/2015   NA 140 05/20/2015   K 4.0 05/20/2015   CL 105 05/20/2015   CREATININE 0.78 05/20/2015   BUN 15 05/20/2015   CO2 26 05/20/2015   TSH 0.621 05/20/2015   HGBA1C 5.7 02/04/2015    Lab Results  Component Value Date   TSH 0.621 05/20/2015   Lab Results  Component Value Date   WBC 6.4 05/20/2015  HGB 14.3 05/20/2015   HCT 41.9 05/20/2015   MCV 93.5 05/20/2015   PLT 247 05/20/2015   Lab Results  Component Value Date   NA 140 05/20/2015   K 4.0 05/20/2015   CO2 26 05/20/2015   GLUCOSE 96 05/20/2015   BUN 15 05/20/2015   CREATININE 0.78 05/20/2015   BILITOT 1.0 05/20/2015   ALKPHOS 69 05/20/2015   AST 20 05/20/2015   ALT 14 05/20/2015   PROT 6.8 05/20/2015   ALBUMIN 4.2 05/20/2015   CALCIUM 10.0 05/20/2015   GFR 59.81* 02/04/2015   Lab Results  Component Value Date   CHOL 199 02/04/2015   Lab Results  Component Value Date   HDL 59.70 02/04/2015   Lab Results  Component Value Date   LDLCALC 126* 02/04/2015   Lab Results  Component Value Date   TRIG 69.0 02/04/2015   Lab Results  Component Value Date   CHOLHDL 3 02/04/2015   Lab Results  Component Value Date   HGBA1C 5.7 02/04/2015       Assessment & Plan:   Problem List Items Addressed This Visit    Overweight (BMI 25.0-29.9)    Encouraged DASH diet, decrease po intake and increase exercise as tolerated. Needs 7-8 hours of sleep nightly. Avoid trans fats, eat small, frequent meals every 4-5 hours with lean proteins, complex carbs and healthy fats. Minimize simple carbs      Low back pain - Primary   Relevant Medications   cyclobenzaprine (FLEXERIL) 10 MG tablet   methylPREDNISolone (MEDROL) 4 MG tablet   Other Relevant Orders   Ambulatory referral to Orthopedic Surgery   Left hip pain    Encouraged  moist heat and gentle stretching as tolerated. May try NSAIDs and prescription meds as directed and report if symptoms worsen or seek immediate care. Topical treatments encouraged. Stay as active as tolerated. Referred to orthopaedics for further evaluation      Hyperglycemia     minimize simple carbs. Increase exercise as tolerated.       Essential hypertension, benign    Well controlled, no changes to meds. Encouraged heart healthy diet such as the DASH diet and exercise as tolerated.       Depression with anxiety   Relevant Medications   ALPRAZolam (XANAX) 0.5 MG tablet    Other Visit Diagnoses    Urinary frequency        Relevant Orders    Urinalysis (Completed)    Urine culture (Completed)       I have discontinued Ms. Ash Calcium Carbonate (CALCIUM 600 PO). I am also having her start on cyclobenzaprine and methylPREDNISolone. Additionally, I am having her maintain her fish oil-omega-3 fatty acids, losartan, gabapentin, and ALPRAZolam.  Meds ordered this encounter  Medications  . cyclobenzaprine (FLEXERIL) 10 MG tablet    Sig: Take 1 tablet (10 mg total) by mouth 3 (three) times daily as needed for muscle spasms.    Dispense:  30 tablet    Refill:  0  . methylPREDNISolone (MEDROL) 4 MG tablet    Sig: 5 tabs po qd once, then 4 tabs po qd once, then 3 tabs po d once then 2 tabs po qd once then 1 tab po qd once    Dispense:  15 tablet    Refill:  0  . ALPRAZolam (XANAX) 0.5 MG tablet    Sig: Take 0.5-1 tablets (0.25-0.5 mg total) by mouth 2 (two) times daily as needed for anxiety or sleep.    Dispense:  60 tablet    Refill:  1     Penni Homans, MD

## 2015-07-12 ENCOUNTER — Other Ambulatory Visit: Payer: Self-pay | Admitting: Family Medicine

## 2015-07-12 DIAGNOSIS — Z1231 Encounter for screening mammogram for malignant neoplasm of breast: Secondary | ICD-10-CM

## 2015-07-18 ENCOUNTER — Ambulatory Visit (HOSPITAL_BASED_OUTPATIENT_CLINIC_OR_DEPARTMENT_OTHER)
Admission: RE | Admit: 2015-07-18 | Discharge: 2015-07-18 | Disposition: A | Discharge status: Home / Self Care | Payer: Medicare Other | Source: Ambulatory Visit | Attending: Family Medicine | Admitting: Family Medicine

## 2015-07-18 DIAGNOSIS — Z1231 Encounter for screening mammogram for malignant neoplasm of breast: Secondary | ICD-10-CM | POA: Diagnosis present

## 2015-10-07 ENCOUNTER — Encounter: Payer: Self-pay | Admitting: Family Medicine

## 2015-10-07 ENCOUNTER — Ambulatory Visit (INDEPENDENT_AMBULATORY_CARE_PROVIDER_SITE_OTHER): Payer: Medicare Other | Admitting: Family Medicine

## 2015-10-07 VITALS — BP 142/71 | HR 72 | Temp 98.1°F | Ht 65.0 in | Wt 180.0 lb

## 2015-10-07 DIAGNOSIS — M949 Disorder of cartilage, unspecified: Secondary | ICD-10-CM

## 2015-10-07 DIAGNOSIS — R739 Hyperglycemia, unspecified: Secondary | ICD-10-CM | POA: Diagnosis not present

## 2015-10-07 DIAGNOSIS — E782 Mixed hyperlipidemia: Secondary | ICD-10-CM

## 2015-10-07 DIAGNOSIS — E663 Overweight: Secondary | ICD-10-CM

## 2015-10-07 DIAGNOSIS — M899 Disorder of bone, unspecified: Secondary | ICD-10-CM | POA: Diagnosis not present

## 2015-10-07 DIAGNOSIS — K449 Diaphragmatic hernia without obstruction or gangrene: Secondary | ICD-10-CM

## 2015-10-07 DIAGNOSIS — K219 Gastro-esophageal reflux disease without esophagitis: Secondary | ICD-10-CM

## 2015-10-07 DIAGNOSIS — F418 Other specified anxiety disorders: Secondary | ICD-10-CM

## 2015-10-07 DIAGNOSIS — G4700 Insomnia, unspecified: Secondary | ICD-10-CM

## 2015-10-07 DIAGNOSIS — I10 Essential (primary) hypertension: Secondary | ICD-10-CM | POA: Diagnosis not present

## 2015-10-07 DIAGNOSIS — M545 Low back pain: Secondary | ICD-10-CM

## 2015-10-07 LAB — COMPREHENSIVE METABOLIC PANEL
ALT: 19 U/L (ref 0–35)
AST: 22 U/L (ref 0–37)
Albumin: 4.1 g/dL (ref 3.5–5.2)
Alkaline Phosphatase: 67 U/L (ref 39–117)
BUN: 11 mg/dL (ref 6–23)
CO2: 33 mEq/L — ABNORMAL HIGH (ref 19–32)
Calcium: 11 mg/dL — ABNORMAL HIGH (ref 8.4–10.5)
Chloride: 104 mEq/L (ref 96–112)
Creatinine, Ser: 0.92 mg/dL (ref 0.40–1.20)
GFR: 63.46 mL/min (ref >60.00)
Glucose, Bld: 94 mg/dL (ref 70–99)
Potassium: 4.1 mEq/L (ref 3.5–5.1)
Sodium: 142 mEq/L (ref 135–145)
Total Bilirubin: 0.8 mg/dL (ref 0.2–1.2)
Total Protein: 7 g/dL (ref 6.0–8.3)

## 2015-10-07 LAB — TSH: TSH: 1.81 u[IU]/mL (ref 0.35–4.50)

## 2015-10-07 LAB — LIPID PANEL
Cholesterol: 194 mg/dL (ref 0–200)
HDL: 68.4 mg/dL (ref >39.00)
LDL Cholesterol: 112 mg/dL — ABNORMAL HIGH (ref 0–99)
NonHDL: 125.68
Total CHOL/HDL Ratio: 3
Triglycerides: 69 mg/dL (ref 0.0–149.0)
VLDL: 13.8 mg/dL (ref 0.0–40.0)

## 2015-10-07 LAB — VITAMIN D 25 HYDROXY (VIT D DEFICIENCY, FRACTURES): VITD: 44.12 ng/mL (ref 30.00–100.00)

## 2015-10-07 LAB — HEMOGLOBIN A1C: Hgb A1c MFr Bld: 6 % (ref 4.6–6.5)

## 2015-10-07 MED ORDER — ALPRAZOLAM 0.5 MG PO TABS: 0.2500 mg | ORAL_TABLET | Freq: Two times a day (BID) | ORAL | Status: DC | PRN

## 2015-10-07 NOTE — Progress Notes (Signed)
Subjective:    Patient ID: Christina [name], female    DOB: 1941-08-06, 75 y.o.   MRN: [id number]  Chief Complaint  Patient presents with  . Follow-up    HPI Patient is in today for follow up. Feels well today. No recent illness or hospitalizations. Does note some epigastric discomfort at times. No other change in bowels habits. No bloody or tarry bowel movements. Denies CP/palp/SOB/HA/congestion/fevers/GI or GU c/o. Taking meds as prescribed Past Medical History  Diagnosis Date  . Vertigo   . Chicken pox as a child  . Measles as a child  . Mumps as a child  . Asthma     a little?  [name] Hypertension   . Urinary frequency 09/28/2011  . Cervical cancer screening 01/31/2012  . Dysphagia 01/31/2012  . Routine health maintenance 01/31/2012  . Sinusitis, acute 07/10/2012  . Hiatal hernia with gastroesophageal reflux 10/26/2010    Qualifier: Diagnosis of  By: [name] MD, [name]    . Dermatitis of external ear 07/11/2012  . Neck pain on left side 06/21/2013  . Hyperglycemia 06/21/2013  . Depression with anxiety 06/07/2009    Qualifier: Diagnosis of  By: [name] MD, [name]    . Preventative health care 01/31/2012  . RUQ pain 05/29/2015  . Low back pain 05/29/2015  . Overweight (BMI 25.0-29.9) 10/07/2008    Qualifier: Diagnosis of  By: [name] MD, [name]    . Left hip pain 07/10/2015  . Hyperlipidemia, mixed 10/16/2015    Past Surgical History  Procedure Laterality Date  . Breast biopsy  1960, 70's  . Tubal ligation  1970  . Cystectomy      abdomen    Family History  Problem Relation Age of Onset  . Cancer Mother 64    lung- smoker, ovarian  . Alcohol abuse Father   . Cancer Sister     ovarian  . Diabetes Sister   . Hypertension Sister   . Other Maternal Grandmother     pacemaker  . Multiple sclerosis Maternal Grandfather     ?    Social History   Social History  . Marital Status: Widowed    Spouse Name: N/A  . Number of Children: N/A  . Years of Education:  N/A   Occupational History  . Not on file.   Social History Main Topics  . Smoking status: Former Smoker -- 1.00 packs/day for 30 years    Types: Cigarettes    Quit date: 08/27/1990  . Smokeless tobacco: Never Used  . Alcohol Use: No  . Drug Use: No  . Sexual Activity: Not on file     Comment: lives alone, no dietary restrictions   Other Topics Concern  . Not on file   Social History Narrative    Outpatient Prescriptions Prior to Visit  Medication Sig Dispense Refill  . fish oil-omega-3 fatty acids 1000 MG capsule Take 1 g by mouth daily.      [name] losartan (COZAAR) 50 MG tablet Take 1 tablet (50 mg total) by mouth daily. 90 tablet 3  . ALPRAZolam (XANAX) 0.5 MG tablet Take 0.5-1 tablets (0.25-0.5 mg total) by mouth 2 (two) times daily as needed for anxiety or sleep. 60 tablet 1  . cyclobenzaprine (FLEXERIL) 10 MG tablet Take 1 tablet (10 mg total) by mouth 3 (three) times daily as needed for muscle spasms. 30 tablet 0  . gabapentin (NEURONTIN) 100 MG capsule 1 cap po qhs x 1 day then 2 caps po qhs x 1 day  then 3 caps po qhs as needed and as tolerated (Patient not taking: Reported on 06/27/2015) 90 capsule 3  . methylPREDNISolone (MEDROL) 4 MG tablet 5 tabs po qd once, then 4 tabs po qd once, then 3 tabs po d once then 2 tabs po qd once then 1 tab po qd once 15 tablet 0   No facility-administered medications prior to visit.    Allergies  Allergen Reactions  . Hydrochlorothiazide     REACTION: cough  . Lisinopril     REACTION: cough    Review of Systems  Constitutional: Negative for fever and malaise/fatigue.  HENT: Negative for congestion.   Eyes: Negative for discharge.  Respiratory: Negative for shortness of breath.   Cardiovascular: Negative for chest pain, palpitations and leg swelling.  Gastrointestinal: Positive for abdominal pain. Negative for nausea.  Genitourinary: Negative for dysuria.  Musculoskeletal: Negative for falls.  Skin: Negative for rash.    Neurological: Negative for loss of consciousness and headaches.  Endo/Heme/Allergies: Negative for environmental allergies.  Psychiatric/Behavioral: Negative for depression. The patient is not nervous/anxious.        Objective:    Physical Exam  Constitutional: She is oriented to person, place, and time. She appears well-developed and well-nourished. No distress.  HENT:  Head: Normocephalic and atraumatic.  Nose: Nose normal.  Eyes: Right eye exhibits no discharge. Left eye exhibits no discharge.  Neck: Normal range of motion. Neck supple.  Cardiovascular: Normal rate and regular rhythm.   No murmur heard. Pulmonary/Chest: Effort normal and breath sounds normal.  Abdominal: Soft. Bowel sounds are normal. There is no tenderness.  Musculoskeletal: She exhibits no edema.  Neurological: She is alert and oriented to person, place, and time.  Skin: Skin is warm and dry.  Psychiatric: She has a normal mood and affect.  Nursing note and vitals reviewed.   BP 142/71 mmHg  Pulse 72  Temp(Src) 98.1 F (36.7 C) (Oral)  Ht 5\' 5"  (1.651 m)  Wt 180 lb (81.647 kg)  BMI 29.95 kg/m2  SpO2 97% Wt Readings from Last 3 Encounters:  10/07/15 180 lb (81.647 kg)  06/27/15 180 lb (81.647 kg)  02/04/15 179 lb 9 oz (81.449 kg)     Lab Results  Component Value Date   WBC 5.3 10/07/2015   HGB 14.6 10/07/2015   HCT 46.0 10/07/2015   PLT 245.0 10/07/2015   GLUCOSE 94 10/07/2015   CHOL 194 10/07/2015   TRIG 69.0 10/07/2015   HDL 68.40 10/07/2015   LDLCALC 112* 10/07/2015   ALT 19 10/07/2015   AST 22 10/07/2015   NA 142 10/07/2015   K 4.1 10/07/2015   CL 104 10/07/2015   CREATININE 0.92 10/07/2015   BUN 11 10/07/2015   CO2 33* 10/07/2015   TSH 1.81 10/07/2015   HGBA1C 6.0 10/07/2015    Lab Results  Component Value Date   TSH 1.81 10/07/2015   Lab Results  Component Value Date   WBC 5.3 10/07/2015   HGB 14.6 10/07/2015   HCT 46.0 10/07/2015   MCV 100.0 10/07/2015   PLT 245.0  10/07/2015   Lab Results  Component Value Date   NA 142 10/07/2015   K 4.1 10/07/2015   CO2 33* 10/07/2015   GLUCOSE 94 10/07/2015   BUN 11 10/07/2015   CREATININE 0.92 10/07/2015   BILITOT 0.8 10/07/2015   ALKPHOS 67 10/07/2015   AST 22 10/07/2015   ALT 19 10/07/2015   PROT 7.0 10/07/2015   ALBUMIN 4.1 10/07/2015   CALCIUM 11.0*  10/07/2015   GFR 63.46 10/07/2015   Lab Results  Component Value Date   CHOL 194 10/07/2015   Lab Results  Component Value Date   HDL 68.40 10/07/2015   Lab Results  Component Value Date   LDLCALC 112* 10/07/2015   Lab Results  Component Value Date   TRIG 69.0 10/07/2015   Lab Results  Component Value Date   CHOLHDL 3 10/07/2015   Lab Results  Component Value Date   HGBA1C 6.0 10/07/2015       Assessment & Plan:   Problem List Items Addressed This Visit    Depression with anxiety - Primary   Relevant Medications   ALPRAZolam (XANAX) 0.5 MG tablet   Other Relevant Orders   Comprehensive metabolic panel (Completed)   CBC (Completed)   TSH (Completed)   Lipid panel (Completed)   Hemoglobin A1c (Completed)   VITAMIN D 25 Hydroxy (Vit-D Deficiency, Fractures) (Completed)   CBC   Comprehensive metabolic panel   Lipid panel   TSH   Hemoglobin A1c   VITAMIN D 25 Hydroxy (Vit-D Deficiency, Fractures)   Disorder of bone and cartilage   Relevant Medications   ALPRAZolam (XANAX) 0.5 MG tablet   Other Relevant Orders   Comprehensive metabolic panel (Completed)   CBC (Completed)   TSH (Completed)   Lipid panel (Completed)   Hemoglobin A1c (Completed)   VITAMIN D 25 Hydroxy (Vit-D Deficiency, Fractures) (Completed)   CBC   Comprehensive metabolic panel   Lipid panel   TSH   Hemoglobin A1c   VITAMIN D 25 Hydroxy (Vit-D Deficiency, Fractures)   Essential hypertension, benign    Well controlled, no changes to meds. Encouraged heart healthy diet such as the DASH diet and exercise as tolerated.       Relevant Medications    ALPRAZolam (XANAX) 0.5 MG tablet   Other Relevant Orders   Comprehensive metabolic panel (Completed)   CBC (Completed)   TSH (Completed)   Lipid panel (Completed)   Hemoglobin A1c (Completed)   VITAMIN D 25 Hydroxy (Vit-D Deficiency, Fractures) (Completed)   CBC   Comprehensive metabolic panel   Lipid panel   TSH   Hemoglobin A1c   VITAMIN D 25 Hydroxy (Vit-D Deficiency, Fractures)   Hiatal hernia with gastroesophageal reflux    Avoid offending foods, start probiotics. Do not eat large meals in late evening and consider raising head of bed.       Hyperglycemia    hgba1c acceptable, minimize simple carbs. Increase exercise as tolerated.       Relevant Medications   ALPRAZolam (XANAX) 0.5 MG tablet   Other Relevant Orders   Comprehensive metabolic panel (Completed)   CBC (Completed)   TSH (Completed)   Lipid panel (Completed)   Hemoglobin A1c (Completed)   VITAMIN D 25 Hydroxy (Vit-D Deficiency, Fractures) (Completed)   CBC   Comprehensive metabolic panel   Lipid panel   TSH   Hemoglobin A1c   VITAMIN D 25 Hydroxy (Vit-D Deficiency, Fractures)   Hyperlipidemia, mixed    Encouraged heart healthy diet, increase exercise, avoid trans fats, consider a krill oil cap daily      Insomnia   Relevant Medications   ALPRAZolam (XANAX) 0.5 MG tablet   Other Relevant Orders   Comprehensive metabolic panel (Completed)   CBC (Completed)   TSH (Completed)   Lipid panel (Completed)   Hemoglobin A1c (Completed)   VITAMIN D 25 Hydroxy (Vit-D Deficiency, Fractures) (Completed)   CBC   Comprehensive metabolic panel  Lipid panel   TSH   Hemoglobin A1c   VITAMIN D 25 Hydroxy (Vit-D Deficiency, Fractures)   Low back pain    Encouraged moist heat and gentle stretching as tolerated. May try NSAIDs and prescription meds as directed and report if symptoms worsen or seek immediate care      Overweight (BMI 25.0-29.9)   Relevant Orders   CBC   Comprehensive metabolic panel   Lipid  panel   TSH   Hemoglobin A1c   VITAMIN D 25 Hydroxy (Vit-D Deficiency, Fractures)      I have discontinued Ms. Marquiss gabapentin, cyclobenzaprine, and methylPREDNISolone. I am also having her maintain her fish oil-omega-3 fatty acids, losartan, and ALPRAZolam.  Meds ordered this encounter  Medications  . ALPRAZolam (XANAX) 0.5 MG tablet    Sig: Take 0.5-1 tablets (0.25-0.5 mg total) by mouth 2 (two) times daily as needed for anxiety or sleep.    Dispense:  60 tablet    Refill:  1     Penni Homans, MD

## 2015-10-07 NOTE — Patient Instructions (Signed)
Hypertension Hypertension, commonly called high blood pressure, is when the force of blood pumping through your arteries is too strong. Your arteries are the blood vessels that carry blood from your heart throughout your body. A blood pressure reading consists of a higher number over a lower number, such as 110/72. The higher number (systolic) is the pressure inside your arteries when your heart pumps. The lower number (diastolic) is the pressure inside your arteries when your heart relaxes. Ideally you want your blood pressure below 120/80. Hypertension forces your heart to work harder to pump blood. Your arteries may become narrow or stiff. Having untreated or uncontrolled hypertension can cause heart attack, stroke, kidney disease, and other problems. RISK FACTORS Some risk factors for high blood pressure are controllable. Others are not.  Risk factors you cannot control include:   Race. You may be at higher risk if you are African American.  Age. Risk increases with age.  Gender. Men are at higher risk than women before age 45 years. After age 65, women are at higher risk than men. Risk factors you can control include:  Not getting enough exercise or physical activity.  Being overweight.  Getting too much fat, sugar, calories, or salt in your diet.  Drinking too much alcohol. SIGNS AND SYMPTOMS Hypertension does not usually cause signs or symptoms. Extremely high blood pressure (hypertensive crisis) may cause headache, anxiety, shortness of breath, and nosebleed. DIAGNOSIS To check if you have hypertension, your health care provider will measure your blood pressure while you are seated, with your arm held at the level of your heart. It should be measured at least twice using the same arm. Certain conditions can cause a difference in blood pressure between your right and left arms. A blood pressure reading that is higher than normal on one occasion does not mean that you need treatment. If  it is not clear whether you have high blood pressure, you may be asked to return on a different day to have your blood pressure checked again. Or, you may be asked to monitor your blood pressure at home for 1 or more weeks. TREATMENT Treating high blood pressure includes making lifestyle changes and possibly taking medicine. Living a healthy lifestyle can help lower high blood pressure. You may need to change some of your habits. Lifestyle changes may include:  Following the DASH diet. This diet is high in fruits, vegetables, and whole grains. It is low in salt, red meat, and added sugars.  Keep your sodium intake below 2,300 mg per day.  Getting at least 30-45 minutes of aerobic exercise at least 4 times per week.  Losing weight if necessary.  Not smoking.  Limiting alcoholic beverages.  Learning ways to reduce stress. Your health care provider may prescribe medicine if lifestyle changes are not enough to get your blood pressure under control, and if one of the following is true:  You are 18-59 years of age and your systolic blood pressure is above 140.  You are 60 years of age or older, and your systolic blood pressure is above 150.  Your diastolic blood pressure is above 90.  You have diabetes, and your systolic blood pressure is over 140 or your diastolic blood pressure is over 90.  You have kidney disease and your blood pressure is above 140/90.  You have heart disease and your blood pressure is above 140/90. Your personal target blood pressure may vary depending on your medical conditions, your age, and other factors. HOME CARE INSTRUCTIONS    Have your blood pressure rechecked as directed by your health care provider.   Take medicines only as directed by your health care provider. Follow the directions carefully. Blood pressure medicines must be taken as prescribed. The medicine does not work as well when you skip doses. Skipping doses also puts you at risk for  problems.  Do not smoke.   Monitor your blood pressure at home as directed by your health care provider. SEEK MEDICAL CARE IF:   You think you are having a reaction to medicines taken.  You have recurrent headaches or feel dizzy.  You have swelling in your ankles.  You have trouble with your vision. SEEK IMMEDIATE MEDICAL CARE IF:  You develop a severe headache or confusion.  You have unusual weakness, numbness, or feel faint.  You have severe chest or abdominal pain.  You vomit repeatedly.  You have trouble breathing. MAKE SURE YOU:   Understand these instructions.  Will watch your condition.  Will get help right away if you are not doing well or get worse.   This information is not intended to replace advice given to you by your health care provider. Make sure you discuss any questions you have with your health care provider.   Document Released: 08/13/2005 Document Revised: 12/28/2014 Document Reviewed: 06/05/2013 Elsevier Interactive Patient Education 2016 [employer]  

## 2015-10-07 NOTE — Progress Notes (Signed)
Pre visit review using our clinic review tool, if applicable. No additional management support is needed unless otherwise documented below in the visit note. 

## 2015-10-08 LAB — CBC
HCT: 46 % (ref 36.0–46.0)
Hemoglobin: 14.6 g/dL (ref 12.0–15.0)
MCHC: 31.7 g/dL (ref 30.0–36.0)
MCV: 100 fl (ref 78.0–100.0)
Platelets: 245 10*3/uL (ref 150.0–400.0)
RBC: 4.6 Mil/uL (ref 3.87–5.11)
RDW: 14.3 % (ref 11.5–15.5)
WBC: 5.3 10*3/uL (ref 4.0–10.5)

## 2015-10-10 ENCOUNTER — Other Ambulatory Visit: Payer: Self-pay | Admitting: Family Medicine

## 2015-10-16 ENCOUNTER — Encounter: Payer: Self-pay | Admitting: Family Medicine

## 2015-10-16 DIAGNOSIS — E782 Mixed hyperlipidemia: Secondary | ICD-10-CM | POA: Insufficient documentation

## 2015-10-16 HISTORY — DX: Mixed hyperlipidemia: E78.2

## 2015-10-16 NOTE — Assessment & Plan Note (Signed)
Avoid offending foods, start probiotics. Do not eat large meals in late evening and consider raising head of bed.  

## 2015-10-16 NOTE — Assessment & Plan Note (Signed)
Encouraged moist heat and gentle stretching as tolerated. May try NSAIDs and prescription meds as directed and report if symptoms worsen or seek immediate care 

## 2015-10-16 NOTE — Assessment & Plan Note (Signed)
hgba1c acceptable, minimize simple carbs. Increase exercise as tolerated.  

## 2015-10-16 NOTE — Assessment & Plan Note (Signed)
Encouraged heart healthy diet, increase exercise, avoid trans fats, consider a krill oil cap daily 

## 2015-10-16 NOTE — Assessment & Plan Note (Signed)
Well controlled, no changes to meds. Encouraged heart healthy diet such as the DASH diet and exercise as tolerated.  °

## 2015-11-14 ENCOUNTER — Other Ambulatory Visit: Payer: Medicare Other

## 2015-11-14 ENCOUNTER — Other Ambulatory Visit (INDEPENDENT_AMBULATORY_CARE_PROVIDER_SITE_OTHER): Payer: Medicare Other

## 2015-11-14 LAB — COMPREHENSIVE METABOLIC PANEL
ALT: 12 U/L (ref 0–35)
AST: 18 U/L (ref 0–37)
Albumin: 3.9 g/dL (ref 3.5–5.2)
Alkaline Phosphatase: 63 U/L (ref 39–117)
BUN: 17 mg/dL (ref 6–23)
CO2: 30 mEq/L (ref 19–32)
Calcium: 10.1 mg/dL (ref 8.4–10.5)
Chloride: 106 mEq/L (ref 96–112)
Creatinine, Ser: 0.89 mg/dL (ref 0.40–1.20)
GFR: 65.92 mL/min (ref >60.00)
Glucose, Bld: 107 mg/dL — ABNORMAL HIGH (ref 70–99)
Potassium: 3.8 mEq/L (ref 3.5–5.1)
Sodium: 141 mEq/L (ref 135–145)
Total Bilirubin: 0.8 mg/dL (ref 0.2–1.2)
Total Protein: 6.5 g/dL (ref 6.0–8.3)

## 2015-12-26 IMAGING — CR DG CHEST 2V
2 series · 2 of 2 positions shown · non-contrast
Comparison: 10/13/2007 and prior radiographs

CLINICAL DATA: Chronic cough and left chest pain. Initial
encounter.

EXAM:
CHEST  2 VIEW

[w chest pa]
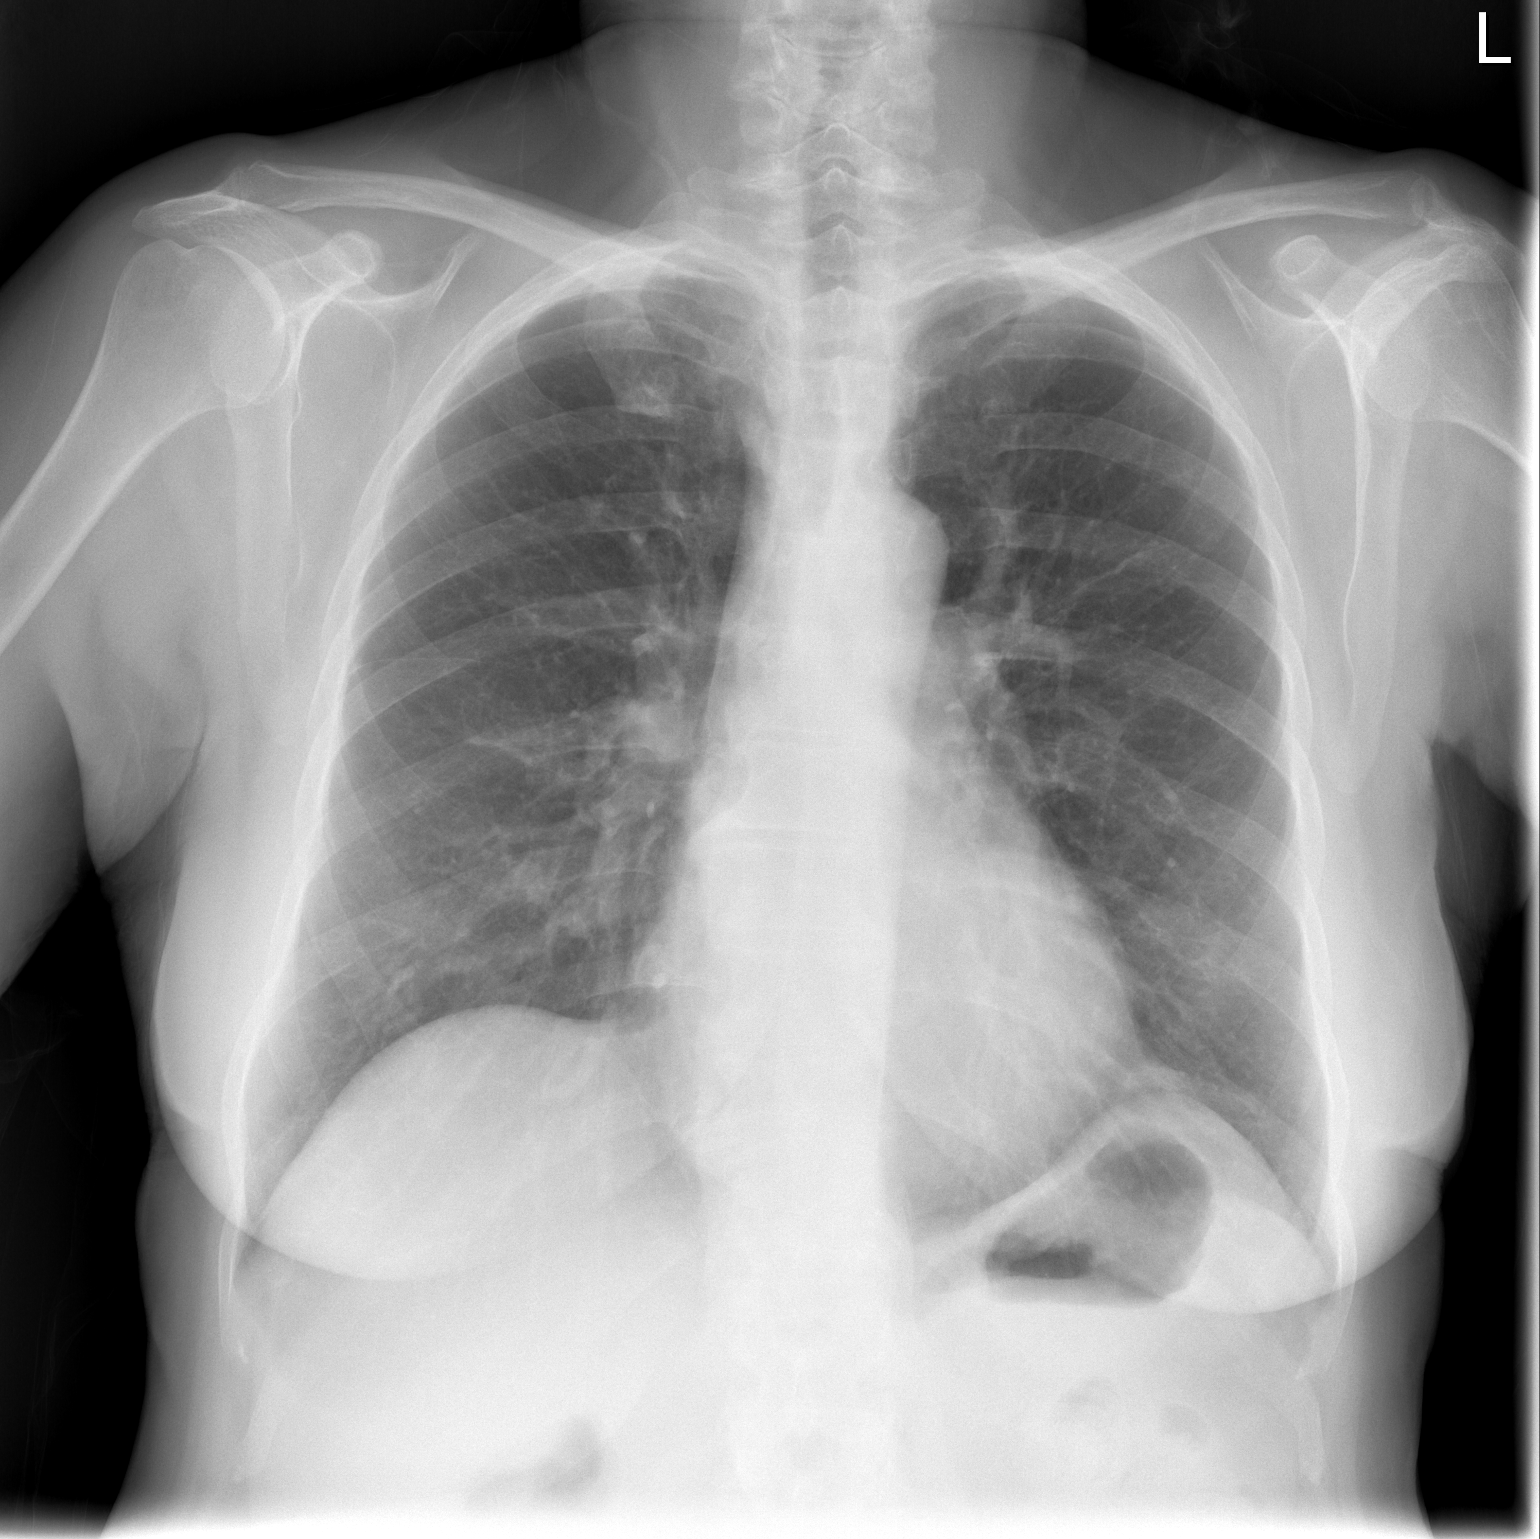

[w chest lat]
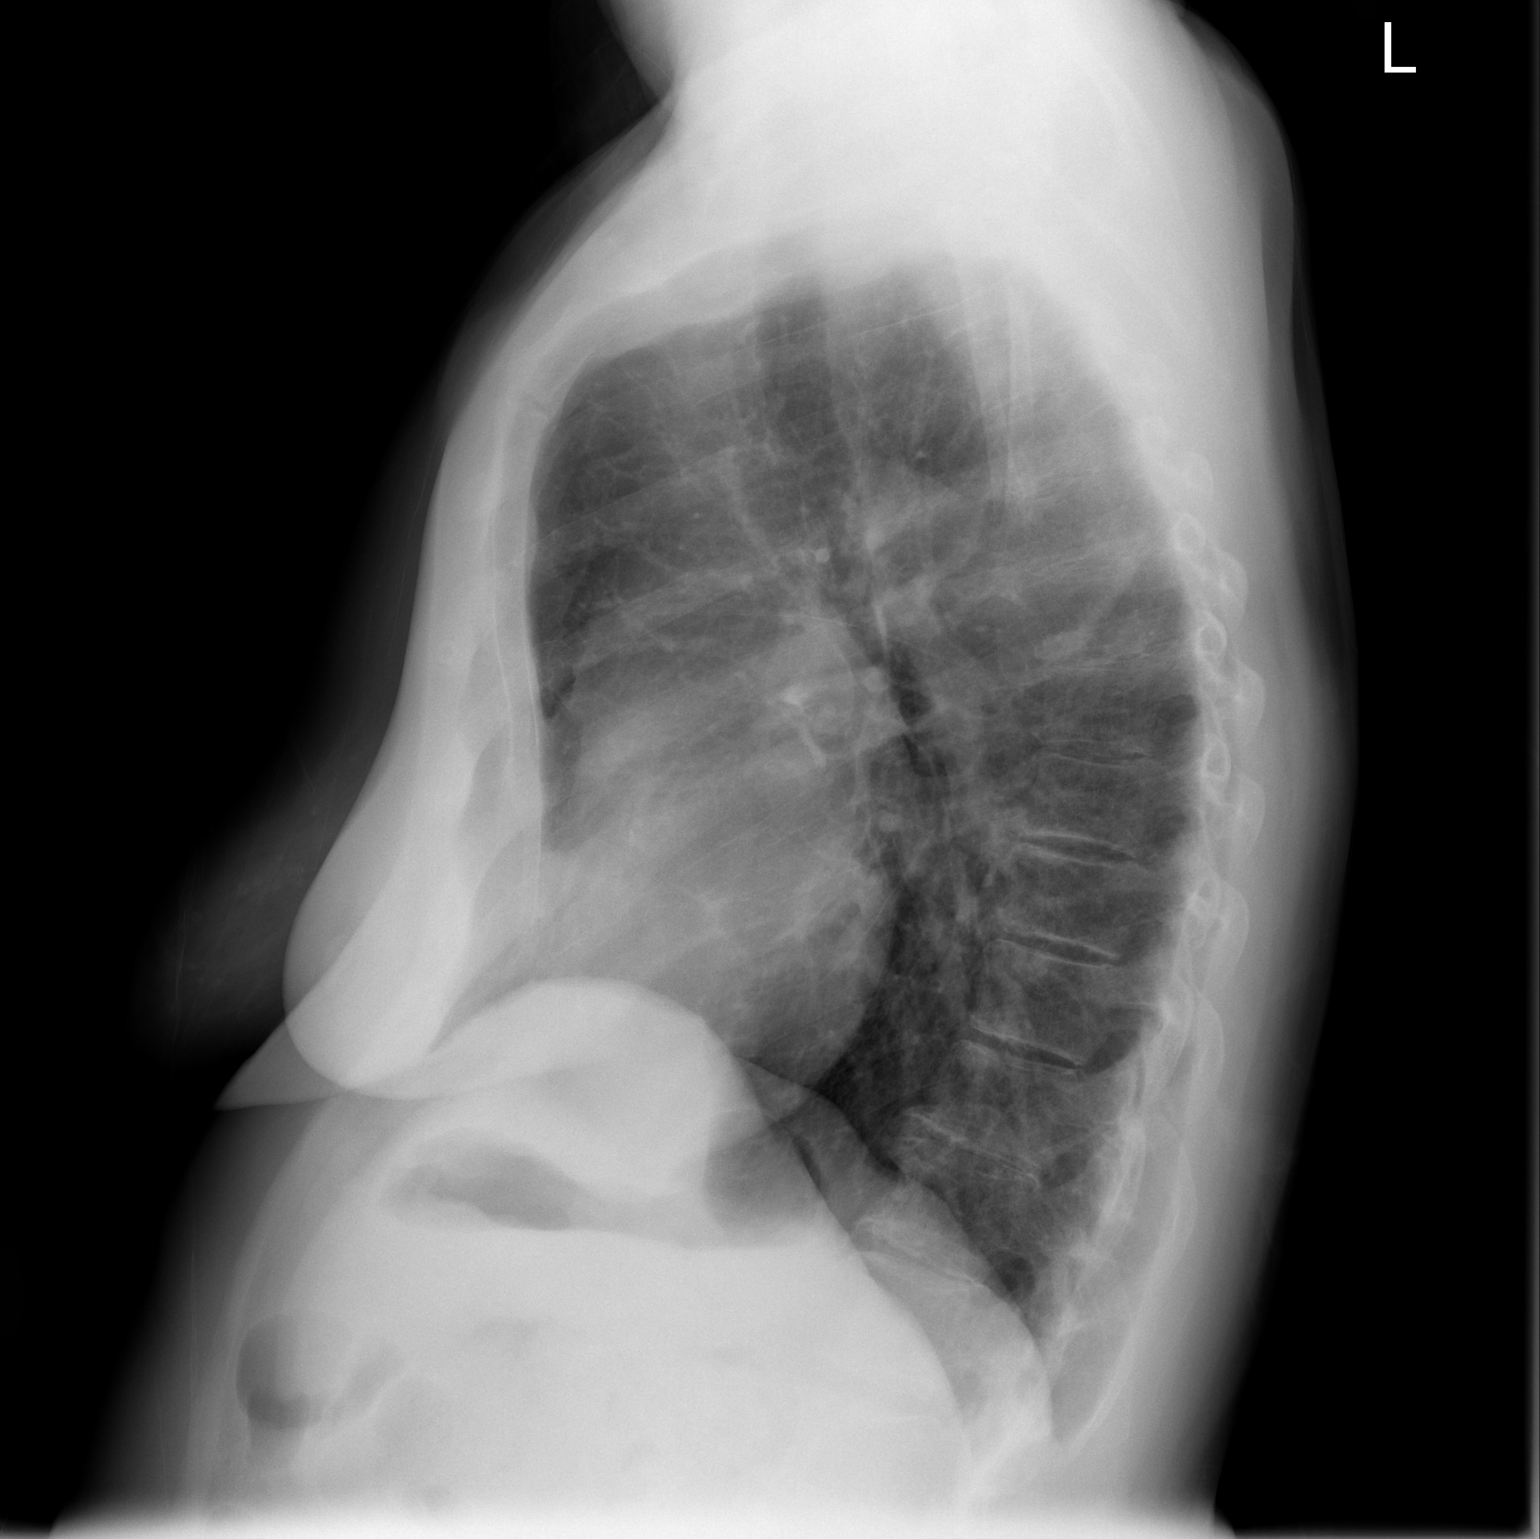

[2 of 2 positions shown; findings below may reference images not displayed]

FINDINGS: The cardiomediastinal silhouette is unremarkable.

There is no evidence of focal airspace disease, pulmonary edema,
suspicious pulmonary nodule/mass, pleural effusion, or pneumothorax.
No acute bony abnormalities are identified.

A pectus excavatum deformity is again noted.
IMPRESSION: No active cardiopulmonary disease.

## 2016-04-03 ENCOUNTER — Other Ambulatory Visit (INDEPENDENT_AMBULATORY_CARE_PROVIDER_SITE_OTHER): Payer: Medicare Other

## 2016-04-03 DIAGNOSIS — G4700 Insomnia, unspecified: Secondary | ICD-10-CM | POA: Diagnosis not present

## 2016-04-03 DIAGNOSIS — I10 Essential (primary) hypertension: Secondary | ICD-10-CM

## 2016-04-03 DIAGNOSIS — M899 Disorder of bone, unspecified: Secondary | ICD-10-CM

## 2016-04-03 DIAGNOSIS — F418 Other specified anxiety disorders: Secondary | ICD-10-CM | POA: Diagnosis not present

## 2016-04-03 DIAGNOSIS — M949 Disorder of cartilage, unspecified: Secondary | ICD-10-CM

## 2016-04-03 DIAGNOSIS — R739 Hyperglycemia, unspecified: Secondary | ICD-10-CM

## 2016-04-03 DIAGNOSIS — E663 Overweight: Secondary | ICD-10-CM

## 2016-04-03 LAB — COMPREHENSIVE METABOLIC PANEL
ALT: 11 U/L (ref 0–35)
AST: 19 U/L (ref 0–37)
Albumin: 4.1 g/dL (ref 3.5–5.2)
Alkaline Phosphatase: 71 U/L (ref 39–117)
BUN: 12 mg/dL (ref 6–23)
CO2: 27 mEq/L (ref 19–32)
Calcium: 10.3 mg/dL (ref 8.4–10.5)
Chloride: 106 mEq/L (ref 96–112)
Creatinine, Ser: 0.85 mg/dL (ref 0.40–1.20)
GFR: 69.44 mL/min (ref >60.00)
Glucose, Bld: 108 mg/dL — ABNORMAL HIGH (ref 70–99)
Potassium: 4 mEq/L (ref 3.5–5.1)
Sodium: 142 mEq/L (ref 135–145)
Total Bilirubin: 0.9 mg/dL (ref 0.2–1.2)
Total Protein: 6.7 g/dL (ref 6.0–8.3)

## 2016-04-03 LAB — LIPID PANEL
Cholesterol: 170 mg/dL (ref 0–200)
HDL: 53.5 mg/dL (ref >39.00)
LDL Cholesterol: 90 mg/dL (ref 0–99)
NonHDL: 116.87
Total CHOL/HDL Ratio: 3
Triglycerides: 133 mg/dL (ref 0.0–149.0)
VLDL: 26.6 mg/dL (ref 0.0–40.0)

## 2016-04-03 LAB — CBC
HCT: 43 % (ref 36.0–46.0)
Hemoglobin: 14.4 g/dL (ref 12.0–15.0)
MCHC: 33.4 g/dL (ref 30.0–36.0)
MCV: 94.3 fl (ref 78.0–100.0)
Platelets: 258 10*3/uL (ref 150.0–400.0)
RBC: 4.56 Mil/uL (ref 3.87–5.11)
RDW: 13.9 % (ref 11.5–15.5)
WBC: 6.5 10*3/uL (ref 4.0–10.5)

## 2016-04-03 LAB — HEMOGLOBIN A1C: Hgb A1c MFr Bld: 5.8 % (ref 4.6–6.5)

## 2016-04-03 LAB — VITAMIN D 25 HYDROXY (VIT D DEFICIENCY, FRACTURES): VITD: 44.36 ng/mL (ref 30.00–100.00)

## 2016-04-03 LAB — TSH: TSH: 1.92 u[IU]/mL (ref 0.35–4.50)

## 2016-04-05 ENCOUNTER — Ambulatory Visit (INDEPENDENT_AMBULATORY_CARE_PROVIDER_SITE_OTHER): Payer: Medicare Other | Admitting: Family Medicine

## 2016-04-05 ENCOUNTER — Encounter: Payer: Self-pay | Admitting: Family Medicine

## 2016-04-05 VITALS — BP 130/72 | HR 74 | Temp 98.2°F | Ht 65.0 in | Wt 185.5 lb

## 2016-04-05 DIAGNOSIS — K429 Umbilical hernia without obstruction or gangrene: Secondary | ICD-10-CM | POA: Diagnosis not present

## 2016-04-05 DIAGNOSIS — I10 Essential (primary) hypertension: Secondary | ICD-10-CM | POA: Diagnosis not present

## 2016-04-05 DIAGNOSIS — E663 Overweight: Secondary | ICD-10-CM

## 2016-04-05 DIAGNOSIS — G4700 Insomnia, unspecified: Secondary | ICD-10-CM | POA: Diagnosis not present

## 2016-04-05 DIAGNOSIS — R739 Hyperglycemia, unspecified: Secondary | ICD-10-CM | POA: Diagnosis not present

## 2016-04-05 DIAGNOSIS — M899 Disorder of bone, unspecified: Secondary | ICD-10-CM

## 2016-04-05 DIAGNOSIS — F418 Other specified anxiety disorders: Secondary | ICD-10-CM

## 2016-04-05 DIAGNOSIS — E782 Mixed hyperlipidemia: Secondary | ICD-10-CM

## 2016-04-05 DIAGNOSIS — Z0000 Encounter for general adult medical examination without abnormal findings: Secondary | ICD-10-CM | POA: Diagnosis not present

## 2016-04-05 DIAGNOSIS — M949 Disorder of cartilage, unspecified: Secondary | ICD-10-CM

## 2016-04-05 MED ORDER — LOSARTAN POTASSIUM 50 MG PO TABS: 50.0000 mg | ORAL_TABLET | Freq: Every day | ORAL | 3 refills | Status: DC

## 2016-04-05 MED ORDER — ALPRAZOLAM 0.5 MG PO TABS: 0.2500 mg | ORAL_TABLET | Freq: Two times a day (BID) | ORAL | 1 refills | Status: DC | PRN

## 2016-04-05 NOTE — Patient Instructions (Addendum)
Add NOW probiotic 10 strain cap daily at [employer] once or twice daily Cleanse with [employer].   Preventive Care for Adults, Female A healthy lifestyle and preventive care can promote health and wellness. Preventive health guidelines for women include the following key practices.  A routine yearly physical is a good way to check with your health care provider about your health and preventive screening. It is a chance to share any concerns and updates on your health and to receive a thorough exam.  Visit your dentist for a routine exam and preventive care every 6 months. Brush your teeth twice a day and floss once a day. Good oral hygiene prevents tooth decay and gum disease.  The frequency of eye exams is based on your age, health, family medical history, use of contact lenses, and other factors. Follow your health care provider's recommendations for frequency of eye exams.  Eat a healthy diet. Foods like vegetables, fruits, whole grains, low-fat dairy products, and lean protein foods contain the nutrients you need without too many calories. Decrease your intake of foods high in solid fats, added sugars, and salt. Eat the right amount of calories for you.Get information about a proper diet from your health care provider, if necessary.  Regular physical exercise is one of the most important things you can do for your health. Most adults should get at least 150 minutes of moderate-intensity exercise (any activity that increases your heart rate and causes you to sweat) each week. In addition, most adults need muscle-strengthening exercises on 2 or more days a week.  Maintain a healthy weight. The body mass index (BMI) is a screening tool to identify possible weight problems. It provides an estimate of body fat based on height and weight. Your health care provider can find your BMI and can help you achieve or maintain a healthy weight.For adults 20 years and  older:  A BMI below 18.5 is considered underweight.  A BMI of 18.5 to 24.9 is normal.  A BMI of 25 to 29.9 is considered overweight.  A BMI of 30 and above is considered obese.  Maintain normal blood lipids and cholesterol levels by exercising and minimizing your intake of saturated fat. Eat a balanced diet with plenty of fruit and vegetables. Blood tests for lipids and cholesterol should begin at age 20 and be repeated every 5 years. If your lipid or cholesterol levels are high, you are over 50, or you are at high risk for heart disease, you may need your cholesterol levels checked more frequently.Ongoing high lipid and cholesterol levels should be treated with medicines if diet and exercise are not working.  If you smoke, find out from your health care provider how to quit. If you do not use tobacco, do not start.  Lung cancer screening is recommended for adults aged 35-80 years who are at high risk for developing lung cancer because of a history of smoking. A yearly low-dose CT scan of the lungs is recommended for people who have at least a 30-pack-year history of smoking and are a current smoker or have quit within the past 15 years. A pack year of smoking is smoking an average of 1 pack of cigarettes a day for 1 year (for example: 1 pack a day for 30 years or 2 packs a day for 15 years). Yearly screening should continue until the smoker has stopped smoking for at least 15 years. Yearly screening should be stopped for people who develop a  health problem that would prevent them from having lung cancer treatment.  If you are pregnant, do not drink alcohol. If you are breastfeeding, be very cautious about drinking alcohol. If you are not pregnant and choose to drink alcohol, do not have more than 1 drink per day. One drink is considered to be 12 ounces (355 mL) of beer, 5 ounces (148 mL) of wine, or 1.5 ounces (44 mL) of liquor.  Avoid use of street drugs. Do not share needles with anyone. Ask  for help if you need support or instructions about stopping the use of drugs.  High blood pressure causes heart disease and increases the risk of stroke. Your blood pressure should be checked at least every 1 to 2 years. Ongoing high blood pressure should be treated with medicines if weight loss and exercise do not work.  If you are 8-55 years old, ask your health care provider if you should take aspirin to prevent strokes.  Diabetes screening is done by taking a blood sample to check your blood glucose level after you have not eaten for a certain period of time (fasting). If you are not overweight and you do not have risk factors for diabetes, you should be screened once every 3 years starting at age 34. If you are overweight or obese and you are 43-24 years of age, you should be screened for diabetes every year as part of your cardiovascular risk assessment.  Breast cancer screening is essential preventive care for women. You should practice "breast self-awareness." This means understanding the normal appearance and feel of your breasts and may include breast self-examination. Any changes detected, no matter how small, should be reported to a health care provider. Women in their 94s and 30s should have a clinical breast exam (CBE) by a health care provider as part of a regular health exam every 1 to 3 years. After age 45, women should have a CBE every year. Starting at age 25, women should consider having a mammogram (breast X-ray test) every year. Women who have a family history of breast cancer should talk to their health care provider about genetic screening. Women at a high risk of breast cancer should talk to their health care providers about having an MRI and a mammogram every year.  Breast cancer gene (BRCA)-related cancer risk assessment is recommended for women who have family members with BRCA-related cancers. BRCA-related cancers include breast, ovarian, tubal, and peritoneal cancers. Having  family members with these cancers may be associated with an increased risk for harmful changes (mutations) in the breast cancer genes BRCA1 and BRCA2. Results of the assessment will determine the need for genetic counseling and BRCA1 and BRCA2 testing.  Your health care provider may recommend that you be screened regularly for cancer of the pelvic organs (ovaries, uterus, and vagina). This screening involves a pelvic examination, including checking for microscopic changes to the surface of your cervix (Pap test). You may be encouraged to have this screening done every 3 years, beginning at age 15.  For women ages 9-65, health care providers may recommend pelvic exams and Pap testing every 3 years, or they may recommend the Pap and pelvic exam, combined with testing for human papilloma virus (HPV), every 5 years. Some types of HPV increase your risk of cervical cancer. Testing for HPV may also be done on women of any age with unclear Pap test results.  Other health care providers may not recommend any screening for nonpregnant women who are considered low  risk for pelvic cancer and who do not have symptoms. Ask your health care provider if a screening pelvic exam is right for you.  If you have had past treatment for cervical cancer or a condition that could lead to cancer, you need Pap tests and screening for cancer for at least 20 years after your treatment. If Pap tests have been discontinued, your risk factors (such as having a new sexual partner) need to be reassessed to determine if screening should resume. Some women have medical problems that increase the chance of getting cervical cancer. In these cases, your health care provider may recommend more frequent screening and Pap tests.  Colorectal cancer can be detected and often prevented. Most routine colorectal cancer screening begins at the age of 2 years and continues through age 36 years. However, your health care provider may recommend  screening at an earlier age if you have risk factors for colon cancer. On a yearly basis, your health care provider may provide home test kits to check for hidden blood in the stool. Use of a small camera at the end of a tube, to directly examine the colon (sigmoidoscopy or colonoscopy), can detect the earliest forms of colorectal cancer. Talk to your health care provider about this at age 13, when routine screening begins. Direct exam of the colon should be repeated every 5-10 years through age 2 years, unless early forms of precancerous polyps or small growths are found.  People who are at an increased risk for hepatitis B should be screened for this virus. You are considered at high risk for hepatitis B if:  You were born in a country where hepatitis B occurs often. Talk with your health care provider about which countries are considered high risk.  Your parents were born in a high-risk country and you have not received a shot to protect against hepatitis B (hepatitis B vaccine).  You have HIV or AIDS.  You use needles to inject street drugs.  You live with, or have sex with, someone who has hepatitis B.  You get hemodialysis treatment.  You take certain medicines for conditions like cancer, organ transplantation, and autoimmune conditions.  Hepatitis C blood testing is recommended for all people born from [birth year] through 1965 and any individual with known risks for hepatitis C.  Practice safe sex. Use condoms and avoid high-risk sexual practices to reduce the spread of sexually transmitted infections (STIs). STIs include gonorrhea, chlamydia, syphilis, trichomonas, herpes, HPV, and human immunodeficiency virus (HIV). Herpes, HIV, and HPV are viral illnesses that have no cure. They can result in disability, cancer, and death.  You should be screened for sexually transmitted illnesses (STIs) including gonorrhea and chlamydia if:  You are sexually active and are younger than 24 years.  You  are older than 24 years and your health care provider tells you that you are at risk for this type of infection.  Your sexual activity has changed since you were last screened and you are at an increased risk for chlamydia or gonorrhea. Ask your health care provider if you are at risk.  If you are at risk of being infected with HIV, it is recommended that you take a prescription medicine daily to prevent HIV infection. This is called preexposure prophylaxis (PrEP). You are considered at risk if:  You are sexually active and do not regularly use condoms or know the HIV status of your partner(s).  You take drugs by injection.  You are sexually active with a partner  who has HIV.  Talk with your health care provider about whether you are at high risk of being infected with HIV. If you choose to begin PrEP, you should first be tested for HIV. You should then be tested every 3 months for as long as you are taking PrEP.  Osteoporosis is a disease in which the bones lose minerals and strength with aging. This can result in serious bone fractures or breaks. The risk of osteoporosis can be identified using a bone density scan. Women ages 13 years and over and women at risk for fractures or osteoporosis should discuss screening with their health care providers. Ask your health care provider whether you should take a calcium supplement or vitamin D to reduce the rate of osteoporosis.  Menopause can be associated with physical symptoms and risks. Hormone replacement therapy is available to decrease symptoms and risks. You should talk to your health care provider about whether hormone replacement therapy is right for you.  Use sunscreen. Apply sunscreen liberally and repeatedly throughout the day. You should seek shade when your shadow is shorter than you. Protect yourself by wearing long sleeves, pants, a wide-brimmed hat, and sunglasses year round, whenever you are outdoors.  Once a month, do a whole body  skin exam, using a mirror to look at the skin on your back. Tell your health care provider of new moles, moles that have irregular borders, moles that are larger than a pencil eraser, or moles that have changed in shape or color.  Stay current with required vaccines (immunizations).  Influenza vaccine. All adults should be immunized every year.  Tetanus, diphtheria, and acellular pertussis (Td, Tdap) vaccine. Pregnant women should receive 1 dose of Tdap vaccine during each pregnancy. The dose should be obtained regardless of the length of time since the last dose. Immunization is preferred during the 27th-36th week of gestation. An adult who has not previously received Tdap or who does not know her vaccine status should receive 1 dose of Tdap. This initial dose should be followed by tetanus and diphtheria toxoids (Td) booster doses every 10 years. Adults with an unknown or incomplete history of completing a 3-dose immunization series with Td-containing vaccines should begin or complete a primary immunization series including a Tdap dose. Adults should receive a Td booster every 10 years.  Varicella vaccine. An adult without evidence of immunity to varicella should receive 2 doses or a second dose if she has previously received 1 dose. Pregnant females who do not have evidence of immunity should receive the first dose after pregnancy. This first dose should be obtained before leaving the health care facility. The second dose should be obtained 4-8 weeks after the first dose.  Human papillomavirus (HPV) vaccine. Females aged 13-26 years who have not received the vaccine previously should obtain the 3-dose series. The vaccine is not recommended for use in pregnant females. However, pregnancy testing is not needed before receiving a dose. If a female is found to be pregnant after receiving a dose, no treatment is needed. In that case, the remaining doses should be delayed until after the pregnancy.  Immunization is recommended for any person with an immunocompromised condition through the age of 38 years if she did not get any or all doses earlier. During the 3-dose series, the second dose should be obtained 4-8 weeks after the first dose. The third dose should be obtained 24 weeks after the first dose and 16 weeks after the second dose.  Zoster vaccine. One dose  is recommended for adults aged 37 years or older unless certain conditions are present.  Measles, mumps, and rubella (MMR) vaccine. Adults born before [birth year] generally are considered immune to measles and mumps. Adults born in [birth year] or later should have 1 or more doses of MMR vaccine unless there is a contraindication to the vaccine or there is laboratory evidence of immunity to each of the three diseases. A routine second dose of MMR vaccine should be obtained at least 28 days after the first dose for students attending postsecondary schools, health care workers, or international travelers. People who received inactivated measles vaccine or an unknown type of measles vaccine during 1963-1967 should receive 2 doses of MMR vaccine. People who received inactivated mumps vaccine or an unknown type of mumps vaccine before 1979 and are at high risk for mumps infection should consider immunization with 2 doses of MMR vaccine. For females of childbearing age, rubella immunity should be determined. If there is no evidence of immunity, females who are not pregnant should be vaccinated. If there is no evidence of immunity, females who are pregnant should delay immunization until after pregnancy. Unvaccinated health care workers born before [birth year] who lack laboratory evidence of measles, mumps, or rubella immunity or laboratory confirmation of disease should consider measles and mumps immunization with 2 doses of MMR vaccine or rubella immunization with 1 dose of MMR vaccine.  Pneumococcal 13-valent conjugate (PCV13) vaccine. When indicated, a person who is  uncertain of his immunization history and has no record of immunization should receive the PCV13 vaccine. All adults 47 years of age and older should receive this vaccine. An adult aged 63 years or older who has certain medical conditions and has not been previously immunized should receive 1 dose of PCV13 vaccine. This PCV13 should be followed with a dose of pneumococcal polysaccharide (PPSV23) vaccine. Adults who are at high risk for pneumococcal disease should obtain the PPSV23 vaccine at least 8 weeks after the dose of PCV13 vaccine. Adults older than 74 years of age who have normal immune system function should obtain the PPSV23 vaccine dose at least 1 year after the dose of PCV13 vaccine.  Pneumococcal polysaccharide (PPSV23) vaccine. When PCV13 is also indicated, PCV13 should be obtained first. All adults aged 44 years and older should be immunized. An adult younger than age 8 years who has certain medical conditions should be immunized. Any person who resides in a nursing home or long-term care facility should be immunized. An adult smoker should be immunized. People with an immunocompromised condition and certain other conditions should receive both PCV13 and PPSV23 vaccines. People with human immunodeficiency virus (HIV) infection should be immunized as soon as possible after diagnosis. Immunization during chemotherapy or radiation therapy should be avoided. Routine use of PPSV23 vaccine is not recommended for American Indians, [place] Natives, or people younger than 65 years unless there are medical conditions that require PPSV23 vaccine. When indicated, people who have unknown immunization and have no record of immunization should receive PPSV23 vaccine. One-time revaccination 5 years after the first dose of PPSV23 is recommended for people aged 19-64 years who have chronic kidney failure, nephrotic syndrome, asplenia, or immunocompromised conditions. People who received 1-2 doses of PPSV23 before age  12 years should receive another dose of PPSV23 vaccine at age 13 years or later if at least 5 years have passed since the previous dose. Doses of PPSV23 are not needed for people immunized with PPSV23 at or after age 73 years.  Meningococcal vaccine.  Adults with asplenia or persistent complement component deficiencies should receive 2 doses of quadrivalent meningococcal conjugate (MenACWY-D) vaccine. The doses should be obtained at least 2 months apart. Microbiologists working with certain meningococcal bacteria, [employer] recruits, people at risk during an outbreak, and people who travel to or live in countries with a high rate of meningitis should be immunized. A first-year college student up through age 77 years who is living in a residence hall should receive a dose if she did not receive a dose on or after her 16th birthday. Adults who have certain high-risk conditions should receive one or more doses of vaccine.  Hepatitis A vaccine. Adults who wish to be protected from this disease, have certain high-risk conditions, work with hepatitis A-infected animals, work in hepatitis A research labs, or travel to or work in countries with a high rate of hepatitis A should be immunized. Adults who were previously unvaccinated and who anticipate close contact with an international adoptee during the first 60 days after arrival in the [place] from a country with a high rate of hepatitis A should be immunized.  Hepatitis B vaccine. Adults who wish to be protected from this disease, have certain high-risk conditions, may be exposed to blood or other infectious body fluids, are household contacts or sex partners of hepatitis B positive people, are clients or workers in certain care facilities, or travel to or work in countries with a high rate of hepatitis B should be immunized.  Haemophilus influenzae type b (Hib) vaccine. A previously unvaccinated person with asplenia or sickle cell disease or having a  scheduled splenectomy should receive 1 dose of Hib vaccine. Regardless of previous immunization, a recipient of a hematopoietic stem cell transplant should receive a 3-dose series 6-12 months after her successful transplant. Hib vaccine is not recommended for adults with HIV infection. Preventive Services / Frequency Ages 54 to 8 years  Blood pressure check.** / Every 3-5 years.  Lipid and cholesterol check.** / Every 5 years beginning at age 32.  Clinical breast exam.** / Every 3 years for women in their 28s and 90s.  BRCA-related cancer risk assessment.** / For women who have family members with a BRCA-related cancer (breast, ovarian, tubal, or peritoneal cancers).  Pap test.** / Every 2 years from ages 47 through 13. Every 3 years starting at age 80 through age 62 or 70 with a history of 3 consecutive normal Pap tests.  HPV screening.** / Every 3 years from ages 57 through ages 81 to 47 with a history of 3 consecutive normal Pap tests.  Hepatitis C blood test.** / For any individual with known risks for hepatitis C.  Skin self-exam. / Monthly.  Influenza vaccine. / Every year.  Tetanus, diphtheria, and acellular pertussis (Tdap, Td) vaccine.** / Consult your health care provider. Pregnant women should receive 1 dose of Tdap vaccine during each pregnancy. 1 dose of Td every 10 years.  Varicella vaccine.** / Consult your health care provider. Pregnant females who do not have evidence of immunity should receive the first dose after pregnancy.  HPV vaccine. / 3 doses over 6 months, if 37 and younger. The vaccine is not recommended for use in pregnant females. However, pregnancy testing is not needed before receiving a dose.  Measles, mumps, rubella (MMR) vaccine.** / You need at least 1 dose of MMR if you were born in 1957 or later. You may also need a 2nd dose. For females of childbearing age, rubella immunity should be determined. If there  is no evidence of immunity, females who are not  pregnant should be vaccinated. If there is no evidence of immunity, females who are pregnant should delay immunization until after pregnancy.  Pneumococcal 13-valent conjugate (PCV13) vaccine.** / Consult your health care provider.  Pneumococcal polysaccharide (PPSV23) vaccine.** / 1 to 2 doses if you smoke cigarettes or if you have certain conditions.  Meningococcal vaccine.** / 1 dose if you are age 64 to 68 years and a Market researcher living in a residence hall, or have one of several medical conditions, you need to get vaccinated against meningococcal disease. You may also need additional booster doses.  Hepatitis A vaccine.** / Consult your health care provider.  Hepatitis B vaccine.** / Consult your health care provider.  Haemophilus influenzae type b (Hib) vaccine.** / Consult your health care provider. Ages 56 to 74 years  Blood pressure check.** / Every year.  Lipid and cholesterol check.** / Every 5 years beginning at age 11 years.  Lung cancer screening. / Every year if you are aged 44-80 years and have a 30-pack-year history of smoking and currently smoke or have quit within the past 15 years. Yearly screening is stopped once you have quit smoking for at least 15 years or develop a health problem that would prevent you from having lung cancer treatment.  Clinical breast exam.** / Every year after age 40 years.  BRCA-related cancer risk assessment.** / For women who have family members with a BRCA-related cancer (breast, ovarian, tubal, or peritoneal cancers).  Mammogram.** / Every year beginning at age 29 years and continuing for as long as you are in good health. Consult with your health care provider.  Pap test.** / Every 3 years starting at age 33 years through age 35 or 86 years with a history of 3 consecutive normal Pap tests.  HPV screening.** / Every 3 years from ages 77 years through ages 74 to 14 years with a history of 3 consecutive normal Pap  tests.  Fecal occult blood test (FOBT) of stool. / Every year beginning at age 37 years and continuing until age 79 years. You may not need to do this test if you get a colonoscopy every 10 years.  Flexible sigmoidoscopy or colonoscopy.** / Every 5 years for a flexible sigmoidoscopy or every 10 years for a colonoscopy beginning at age 18 years and continuing until age 90 years.  Hepatitis C blood test.** / For all people born from [birth year] through 1964 and any individual with known risks for hepatitis C.  Skin self-exam. / Monthly.  Influenza vaccine. / Every year.  Tetanus, diphtheria, and acellular pertussis (Tdap/Td) vaccine.** / Consult your health care provider. Pregnant women should receive 1 dose of Tdap vaccine during each pregnancy. 1 dose of Td every 10 years.  Varicella vaccine.** / Consult your health care provider. Pregnant females who do not have evidence of immunity should receive the first dose after pregnancy.  Zoster vaccine.** / 1 dose for adults aged 24 years or older.  Measles, mumps, rubella (MMR) vaccine.** / You need at least 1 dose of MMR if you were born in 1957 or later. You may also need a second dose. For females of childbearing age, rubella immunity should be determined. If there is no evidence of immunity, females who are not pregnant should be vaccinated. If there is no evidence of immunity, females who are pregnant should delay immunization until after pregnancy.  Pneumococcal 13-valent conjugate (PCV13) vaccine.** / Consult your health care provider.  Pneumococcal polysaccharide (PPSV23) vaccine.** / 1 to 2 doses if you smoke cigarettes or if you have certain conditions.  Meningococcal vaccine.** / Consult your health care provider.  Hepatitis A vaccine.** / Consult your health care provider.  Hepatitis B vaccine.** / Consult your health care provider.  Haemophilus influenzae type b (Hib) vaccine.** / Consult your health care provider. Ages 11 years and  over  Blood pressure check.** / Every year.  Lipid and cholesterol check.** / Every 5 years beginning at age 31 years.  Lung cancer screening. / Every year if you are aged 95-80 years and have a 30-pack-year history of smoking and currently smoke or have quit within the past 15 years. Yearly screening is stopped once you have quit smoking for at least 15 years or develop a health problem that would prevent you from having lung cancer treatment.  Clinical breast exam.** / Every year after age 51 years.  BRCA-related cancer risk assessment.** / For women who have family members with a BRCA-related cancer (breast, ovarian, tubal, or peritoneal cancers).  Mammogram.** / Every year beginning at age 41 years and continuing for as long as you are in good health. Consult with your health care provider.  Pap test.** / Every 3 years starting at age 43 years through age 71 or 51 years with 3 consecutive normal Pap tests. Testing can be stopped between 65 and 70 years with 3 consecutive normal Pap tests and no abnormal Pap or HPV tests in the past 10 years.  HPV screening.** / Every 3 years from ages 22 years through ages 5 or 42 years with a history of 3 consecutive normal Pap tests. Testing can be stopped between 65 and 70 years with 3 consecutive normal Pap tests and no abnormal Pap or HPV tests in the past 10 years.  Fecal occult blood test (FOBT) of stool. / Every year beginning at age 69 years and continuing until age 24 years. You may not need to do this test if you get a colonoscopy every 10 years.  Flexible sigmoidoscopy or colonoscopy.** / Every 5 years for a flexible sigmoidoscopy or every 10 years for a colonoscopy beginning at age 20 years and continuing until age 26 years.  Hepatitis C blood test.** / For all people born from [birth year] through 1964 and any individual with known risks for hepatitis C.  Osteoporosis screening.** / A one-time screening for women ages 21 years and over and women  at risk for fractures or osteoporosis.  Skin self-exam. / Monthly.  Influenza vaccine. / Every year.  Tetanus, diphtheria, and acellular pertussis (Tdap/Td) vaccine.** / 1 dose of Td every 10 years.  Varicella vaccine.** / Consult your health care provider.  Zoster vaccine.** / 1 dose for adults aged 23 years or older.  Pneumococcal 13-valent conjugate (PCV13) vaccine.** / Consult your health care provider.  Pneumococcal polysaccharide (PPSV23) vaccine.** / 1 dose for all adults aged 105 years and older.  Meningococcal vaccine.** / Consult your health care provider.  Hepatitis A vaccine.** / Consult your health care provider.  Hepatitis B vaccine.** / Consult your health care provider.  Haemophilus influenzae type b (Hib) vaccine.** / Consult your health care provider. ** Family history and personal history of risk and conditions may change your health care provider's recommendations.   This information is not intended to replace advice given to you by your health care provider. Make sure you discuss any questions you have with your health care provider.   Document Released: 10/09/2001 Document Revised:  09/03/2014 Document Reviewed: 01/08/2011 Elsevier Interactive Patient Education [employer].

## 2016-04-05 NOTE — Progress Notes (Signed)
Pre visit review using our clinic review tool, if applicable. No additional management support is needed unless otherwise documented below in the visit note. 

## 2016-04-10 DIAGNOSIS — K429 Umbilical hernia without obstruction or gangrene: Secondary | ICD-10-CM | POA: Insufficient documentation

## 2016-04-10 NOTE — Assessment & Plan Note (Signed)
Patient encouraged to maintain heart healthy diet, regular exercise, adequate sleep. Consider daily probiotics. Take medications as prescribed. Given and reviewed copy of ACP documents from [place] [employer] and encouraged to complete and return 

## 2016-04-10 NOTE — Assessment & Plan Note (Signed)
Well controlled, no changes to meds. Encouraged heart healthy diet such as the DASH diet and exercise as tolerated.  °

## 2016-04-10 NOTE — Assessment & Plan Note (Signed)
Symptomatic with tenderness when it protrudes several times a week. Will refer to surgeon for consutlation

## 2016-04-10 NOTE — Progress Notes (Signed)
Patient ID: Christina [name], female   DOB: Jan 26, 1941, 75 y.o.   MRN: [id number]   Subjective:    Patient ID: Christina [name], female    DOB: 1940-08-31, 75 y.o.   MRN: [id number]  Chief Complaint  Patient presents with  . Annual Exam    HPI Patient is in today for follow up. She is noting some trouble with tenderness around her belly button when her hernia pops out. It happens frequently throughout the week but reduces without difficulty so far. No other acute illness or concern. No polyuria or polydipsia. Denies CP/palp/SOB/HA/congestion/fevers/GI or GU c/o. Taking meds as prescribed.   Past Medical History:  Diagnosis Date  . Asthma    a little?  . Cervical cancer screening 01/31/2012  . Chicken pox as a child  . Depression with anxiety 06/07/2009   Qualifier: Diagnosis of  By: [name] MD, [name]    . Dermatitis of external ear 07/11/2012  . Dysphagia 01/31/2012  . Hiatal hernia with gastroesophageal reflux 10/26/2010   Qualifier: Diagnosis of  By: [name] MD, [name]    . Hyperglycemia 06/21/2013  . Hyperlipidemia, mixed 10/16/2015  . Hypertension   . Left hip pain 07/10/2015  . Low back pain 05/29/2015  . Measles as a child  . Mumps as a child  . Neck pain on left side 06/21/2013  . Overweight (BMI 25.0-29.9) 10/07/2008   Qualifier: Diagnosis of  By: [name] MD, [name]    . Preventative health care 01/31/2012  . Routine health maintenance 01/31/2012  . RUQ pain 05/29/2015  . Sinusitis, acute 07/10/2012  . Urinary frequency 09/28/2011  . Vertigo     Past Surgical History:  Procedure Laterality Date  . BREAST BIOPSY  1960, 70's  . CYSTECTOMY     abdomen  . TUBAL LIGATION  1970    Family History  Problem Relation Age of Onset  . Cancer Mother 74    lung- smoker, ovarian  . Alcohol abuse Father   . Cancer Sister     ovarian  . Diabetes Sister   . Hypertension Sister   . Other Maternal Grandmother     pacemaker  . Multiple sclerosis Maternal Grandfather       ?  [name] Asthma Paternal Uncle     Social History   Social History  . Marital status: Widowed    Spouse name: N/A  . Number of children: N/A  . Years of education: N/A   Occupational History  . Not on file.   Social History Main Topics  . Smoking status: Former Smoker    Packs/day: 1.00    Years: 30.00    Types: Cigarettes    Quit date: 08/27/1990  . Smokeless tobacco: Never Used  . Alcohol use No  . Drug use: No  . Sexual activity: Not on file     Comment: lives alone, no dietary restrictions   Other Topics Concern  . Not on file   Social History Narrative  . No narrative on file    Outpatient Medications Prior to Visit  Medication Sig Dispense Refill  . fish oil-omega-3 fatty acids 1000 MG capsule Take 1 g by mouth daily.      [name] ALPRAZolam (XANAX) 0.5 MG tablet Take 0.5-1 tablets (0.25-0.5 mg total) by mouth 2 (two) times daily as needed for anxiety or sleep. 60 tablet 1  . losartan (COZAAR) 50 MG tablet Take 1 tablet (50 mg total) by mouth daily. 90 tablet 3   No facility-administered medications prior  to visit.     Allergies  Allergen Reactions  . Hydrochlorothiazide     REACTION: cough  . Lisinopril     REACTION: cough    Review of Systems  Constitutional: Negative for fever and malaise/fatigue.  HENT: Negative for congestion.   Eyes: Negative for blurred vision.  Respiratory: Negative for shortness of breath.   Cardiovascular: Negative for chest pain, palpitations and leg swelling.  Gastrointestinal: Negative for abdominal pain, blood in stool and nausea.  Genitourinary: Negative for dysuria and frequency.  Musculoskeletal: Negative for falls.  Skin: Negative for rash.  Neurological: Negative for dizziness, loss of consciousness and headaches.  Endo/Heme/Allergies: Negative for environmental allergies.  Psychiatric/Behavioral: Negative for depression. The patient is not nervous/anxious.        Objective:    Physical Exam  Constitutional: She  is oriented to person, place, and time. She appears well-developed and well-nourished. No distress.  HENT:  Head: Normocephalic and atraumatic.  Nose: Nose normal.  Eyes: Right eye exhibits no discharge. Left eye exhibits no discharge.  Neck: Normal range of motion. Neck supple.  Cardiovascular: Normal rate and regular rhythm.   No murmur heard. Pulmonary/Chest: Effort normal and breath sounds normal.  Abdominal: Soft. Bowel sounds are normal. There is tenderness. There is no rebound and no guarding.  Periumbilical hernia with a 1 cm opening in abdominal wall. Tender to palpation on valsalva.   Musculoskeletal: She exhibits no edema.  Neurological: She is alert and oriented to person, place, and time.  Skin: Skin is warm and dry.  Psychiatric: She has a normal mood and affect.  Nursing note and vitals reviewed.   BP 130/72 (BP Location: Left Arm, Patient Position: Sitting, Cuff Size: Normal)   Pulse 74   Temp 98.2 F (36.8 C) (Oral)   Ht 5\' 5"  (1.651 m)   Wt 185 lb 8 oz (84.1 kg)   SpO2 94%   BMI 30.87 kg/m  Wt Readings from Last 3 Encounters:  04/05/16 185 lb 8 oz (84.1 kg)  10/07/15 180 lb (81.6 kg)  06/27/15 180 lb (81.6 kg)     Lab Results  Component Value Date   WBC 6.5 04/03/2016   HGB 14.4 04/03/2016   HCT 43.0 04/03/2016   PLT 258.0 04/03/2016   GLUCOSE 108 (H) 04/03/2016   CHOL 170 04/03/2016   TRIG 133.0 04/03/2016   HDL 53.50 04/03/2016   LDLCALC 90 04/03/2016   ALT 11 04/03/2016   AST 19 04/03/2016   NA 142 04/03/2016   K 4.0 04/03/2016   CL 106 04/03/2016   CREATININE 0.85 04/03/2016   BUN 12 04/03/2016   CO2 27 04/03/2016   TSH 1.92 04/03/2016   HGBA1C 5.8 04/03/2016    Lab Results  Component Value Date   TSH 1.92 04/03/2016   Lab Results  Component Value Date   WBC 6.5 04/03/2016   HGB 14.4 04/03/2016   HCT 43.0 04/03/2016   MCV 94.3 04/03/2016   PLT 258.0 04/03/2016   Lab Results  Component Value Date   NA 142 04/03/2016   K 4.0  04/03/2016   CO2 27 04/03/2016   GLUCOSE 108 (H) 04/03/2016   BUN 12 04/03/2016   CREATININE 0.85 04/03/2016   BILITOT 0.9 04/03/2016   ALKPHOS 71 04/03/2016   AST 19 04/03/2016   ALT 11 04/03/2016   PROT 6.7 04/03/2016   ALBUMIN 4.1 04/03/2016   CALCIUM 10.3 04/03/2016   GFR 69.44 04/03/2016   Lab Results  Component Value Date  CHOL 170 04/03/2016   Lab Results  Component Value Date   HDL 53.50 04/03/2016   Lab Results  Component Value Date   LDLCALC 90 04/03/2016   Lab Results  Component Value Date   TRIG 133.0 04/03/2016   Lab Results  Component Value Date   CHOLHDL 3 04/03/2016   Lab Results  Component Value Date   HGBA1C 5.8 04/03/2016       Assessment & Plan:   Problem List Items Addressed This Visit    Overweight (BMI 25.0-29.9)    Encouraged DASH diet, decrease po intake and increase exercise as tolerated. Needs 7-8 hours of sleep nightly. Avoid trans fats, eat small, frequent meals every 4-5 hours with lean proteins, complex carbs and healthy fats. Minimize simple carbs      Depression with anxiety   Relevant Medications   ALPRAZolam (XANAX) 0.5 MG tablet   Essential hypertension, benign    Well controlled, no changes to meds. Encouraged heart healthy diet such as the DASH diet and exercise as tolerated.       Relevant Medications   ALPRAZolam (XANAX) 0.5 MG tablet   losartan (COZAAR) 50 MG tablet   Disorder of bone and cartilage   Relevant Medications   ALPRAZolam (XANAX) 0.5 MG tablet   Insomnia   Relevant Medications   ALPRAZolam (XANAX) 0.5 MG tablet   Preventative health care    Patient encouraged to maintain heart healthy diet, regular exercise, adequate sleep. Consider daily probiotics. Take medications as prescribed. Given and reviewed copy of ACP documents from [place] and encouraged to complete and return      Hyperglycemia    hgba1c acceptable, minimize simple carbs. Increase exercise as tolerated. Continue current  meds      Relevant Medications   ALPRAZolam (XANAX) 0.5 MG tablet   Hyperlipidemia, mixed    Encouraged heart healthy diet, increase exercise, avoid trans fats, consider a krill oil cap daily      Relevant Medications   losartan (COZAAR) 50 MG tablet   Umbilical hernia without obstruction and without gangrene - Primary    Symptomatic with tenderness when it protrudes several times a week. Will refer to surgeon for consutlation      Relevant Orders   Ambulatory referral to General Surgery    Other Visit Diagnoses   None.     I am having Ms. Manthei maintain her fish oil-omega-3 fatty acids, magnesium gluconate, Probiotic Product (PROBIOTIC-10 PO), ALPRAZolam, and losartan.  Meds ordered this encounter  Medications  . magnesium gluconate (MAGONATE) 500 MG tablet    Sig: Take 500 mg by mouth daily.  . Probiotic Product (PROBIOTIC-10 PO)    Sig: Take by mouth.  . ALPRAZolam (XANAX) 0.5 MG tablet    Sig: Take 0.5-1 tablets (0.25-0.5 mg total) by mouth 2 (two) times daily as needed for anxiety or sleep.    Dispense:  60 tablet    Refill:  1  . losartan (COZAAR) 50 MG tablet    Sig: Take 1 tablet (50 mg total) by mouth daily.    Dispense:  90 tablet    Refill:  3    D/C PREVIOUS SCRIPTS FOR THIS MEDICATION     Penni Homans, MD

## 2016-04-10 NOTE — Assessment & Plan Note (Signed)
Encouraged heart healthy diet, increase exercise, avoid trans fats, consider a krill oil cap daily 

## 2016-04-10 NOTE — Assessment & Plan Note (Signed)
hgba1c acceptable, minimize simple carbs. Increase exercise as tolerated. Continue current meds 

## 2016-04-10 NOTE — Assessment & Plan Note (Signed)
Encouraged DASH diet, decrease po intake and increase exercise as tolerated. Needs 7-8 hours of sleep nightly. Avoid trans fats, eat small, frequent meals every 4-5 hours with lean proteins, complex carbs and healthy fats. Minimize simple carbs 

## 2016-04-12 ENCOUNTER — Telehealth: Payer: Self-pay | Admitting: Family Medicine

## 2016-04-12 NOTE — Telephone Encounter (Signed)
[id number] ° °

## 2016-04-23 ENCOUNTER — Other Ambulatory Visit: Payer: Self-pay | Admitting: General Surgery

## 2016-04-23 DIAGNOSIS — K439 Ventral hernia without obstruction or gangrene: Secondary | ICD-10-CM

## 2016-04-25 ENCOUNTER — Other Ambulatory Visit: Payer: Self-pay | Admitting: Family Medicine

## 2016-05-11 ENCOUNTER — Ambulatory Visit
Admission: RE | Admit: 2016-05-11 | Discharge: 2016-05-11 | Disposition: A | Discharge status: Home / Self Care | Payer: Medicare Other | Source: Ambulatory Visit | Attending: General Surgery | Admitting: General Surgery

## 2016-05-11 DIAGNOSIS — K439 Ventral hernia without obstruction or gangrene: Secondary | ICD-10-CM

## 2016-05-11 MED ORDER — IOPAMIDOL (ISOVUE-300) INJECTION 61%
100.0000 mL | Freq: Once | INTRAVENOUS | Status: AC | PRN
  Administered 2016-05-11: 100 mL via INTRAVENOUS

## 2016-06-27 ENCOUNTER — Other Ambulatory Visit: Payer: Self-pay | Admitting: Family Medicine

## 2016-06-27 ENCOUNTER — Telehealth: Payer: Self-pay | Admitting: Family Medicine

## 2016-06-27 DIAGNOSIS — I10 Essential (primary) hypertension: Secondary | ICD-10-CM

## 2016-06-27 DIAGNOSIS — G4700 Insomnia, unspecified: Secondary | ICD-10-CM

## 2016-06-27 DIAGNOSIS — F418 Other specified anxiety disorders: Secondary | ICD-10-CM

## 2016-06-27 DIAGNOSIS — R739 Hyperglycemia, unspecified: Secondary | ICD-10-CM

## 2016-06-27 DIAGNOSIS — M899 Disorder of bone, unspecified: Secondary | ICD-10-CM

## 2016-06-27 DIAGNOSIS — M949 Disorder of cartilage, unspecified: Secondary | ICD-10-CM

## 2016-06-27 DIAGNOSIS — Z1231 Encounter for screening mammogram for malignant neoplasm of breast: Secondary | ICD-10-CM

## 2016-06-27 NOTE — Telephone Encounter (Signed)
Relation to [id number] Call back [phone]   Reason for call:  Patient requesting Mamo and Bone Density orders placed with [facility]

## 2016-06-28 ENCOUNTER — Other Ambulatory Visit: Payer: Self-pay | Admitting: Family Medicine

## 2016-06-28 DIAGNOSIS — Z1239 Encounter for other screening for malignant neoplasm of breast: Secondary | ICD-10-CM

## 2016-06-28 DIAGNOSIS — E2839 Other primary ovarian failure: Secondary | ICD-10-CM

## 2016-06-28 NOTE — Telephone Encounter (Signed)
Faxed hardcopy for Alprazolam to [facility] in [facility]

## 2016-06-28 NOTE — Telephone Encounter (Signed)
ordered

## 2016-07-10 IMAGING — US US ABDOMEN COMPLETE
1 series · 14 of 25 positions shown · non-contrast
Comparison: None.

CLINICAL DATA: Epigastric pain.

EXAM:
ULTRASOUND ABDOMEN COMPLETE

[Series 1: us abdomen complete · 0.17mm/px · 14 of 72 slices shown]
[im 1/72]
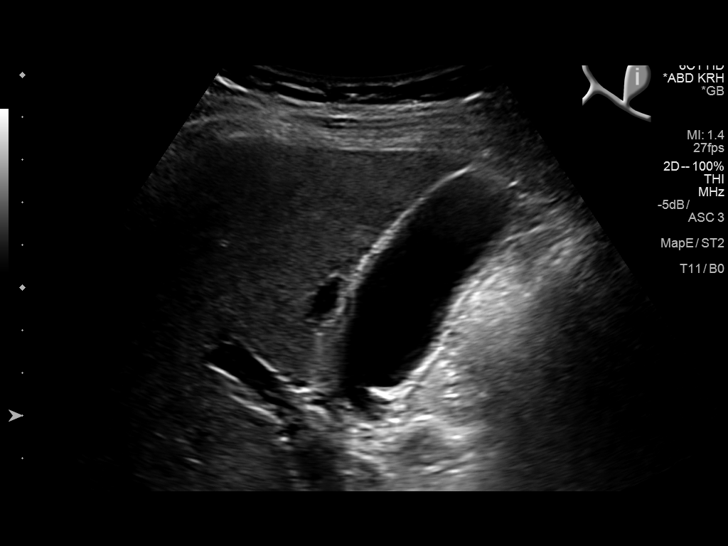
[im 6/72]
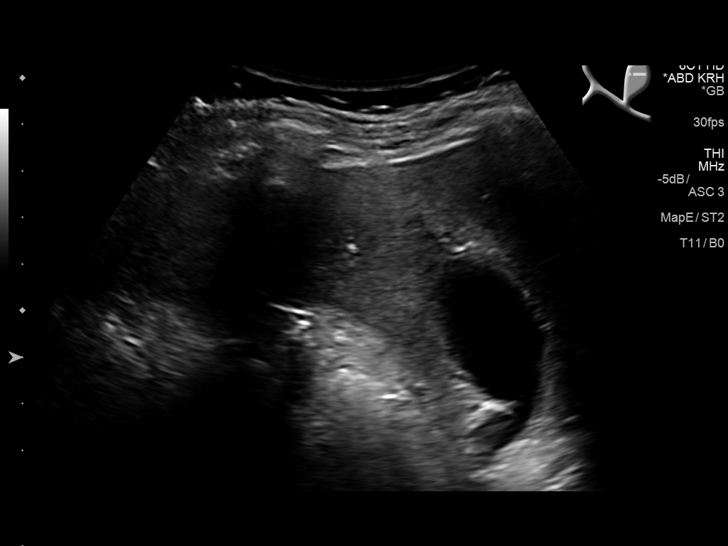
[im 12/72]
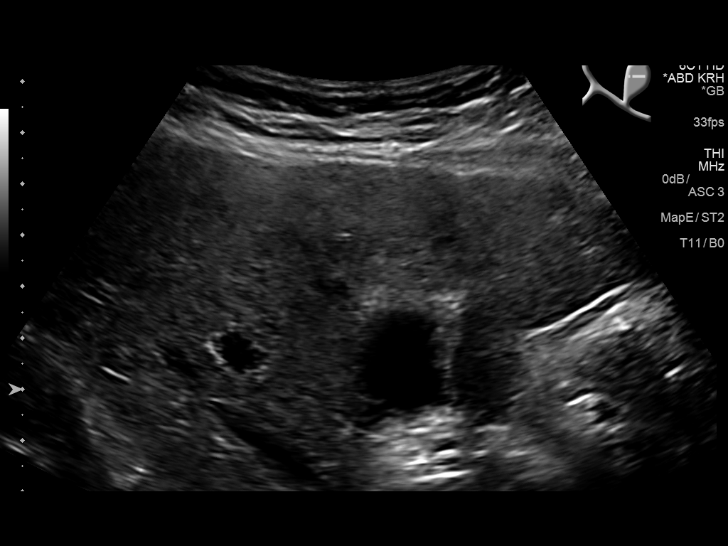
[im 18/72]
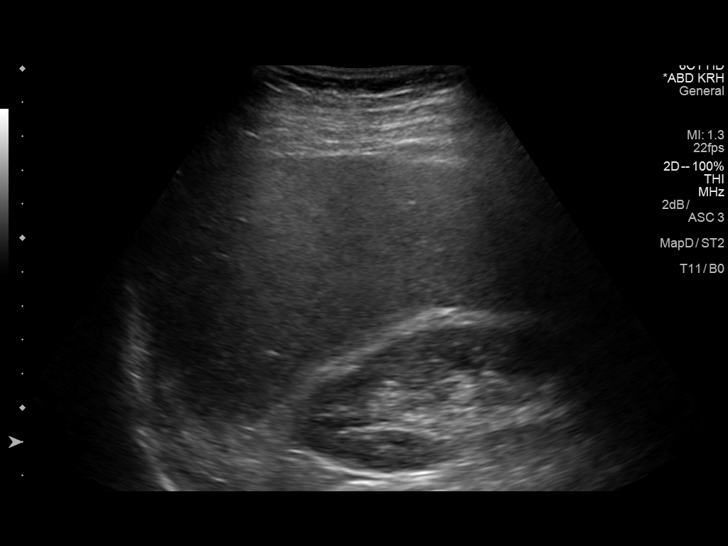
[im 24/72]
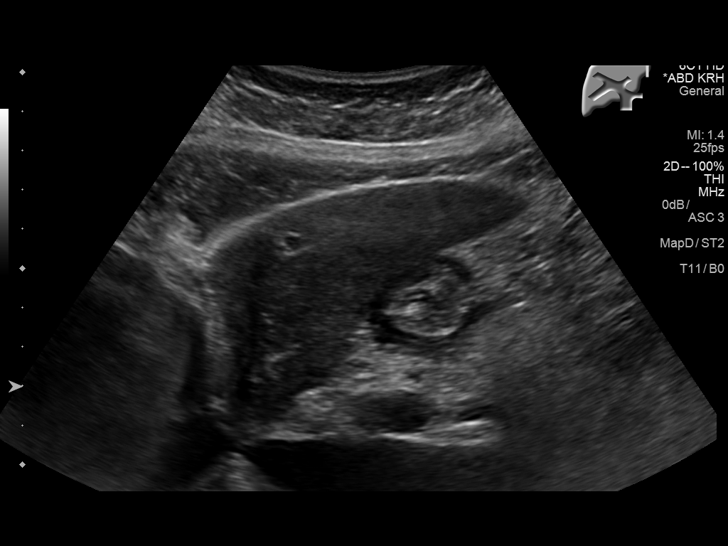
[im 27/72]
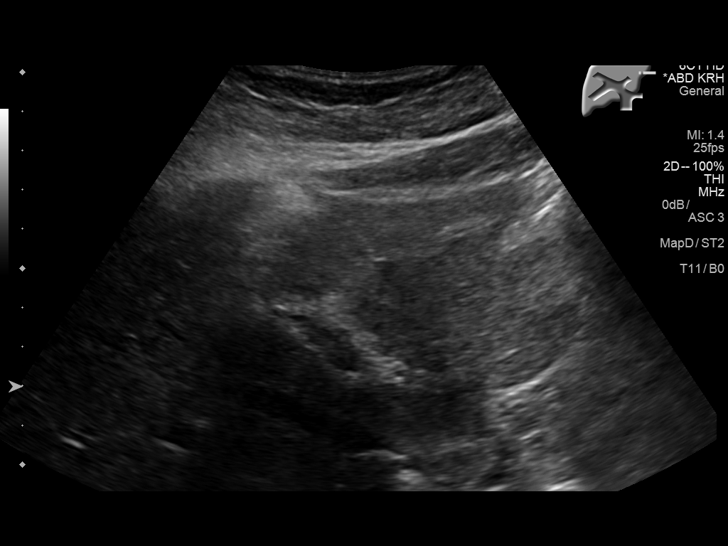
[im 33/72]
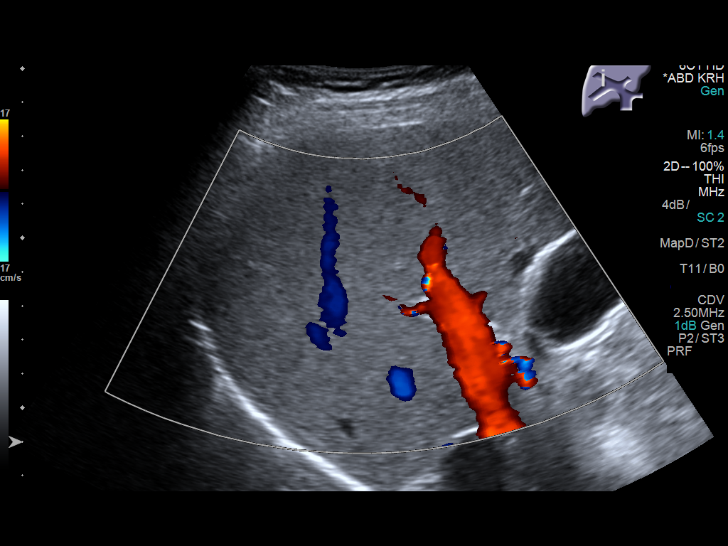
[im 39/72]
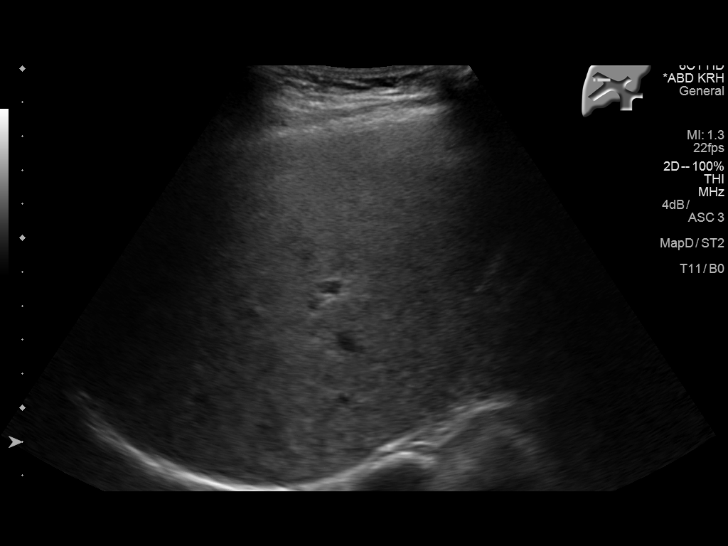
[im 45/72]
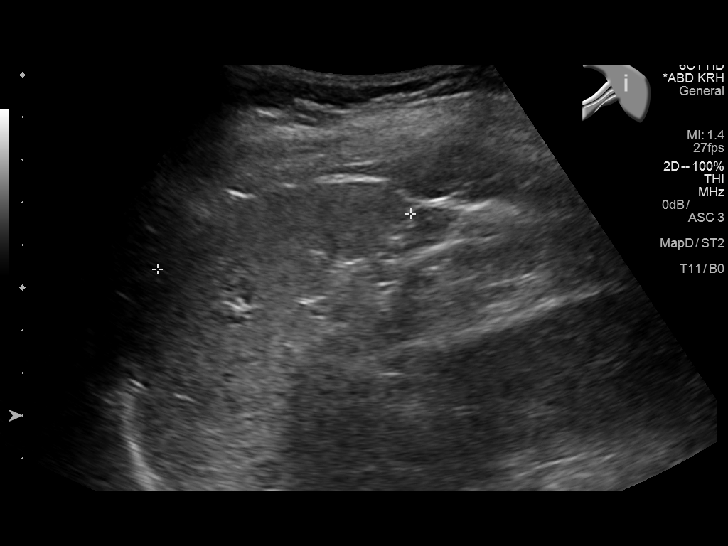
[im 48/72]
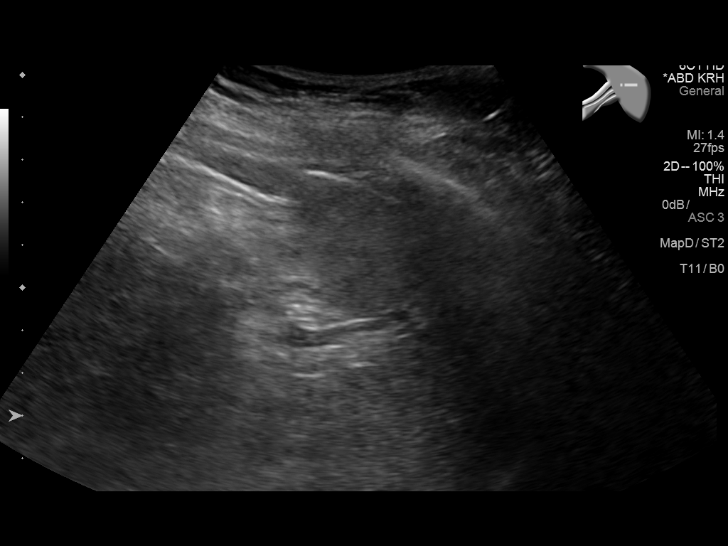
[im 54/72]
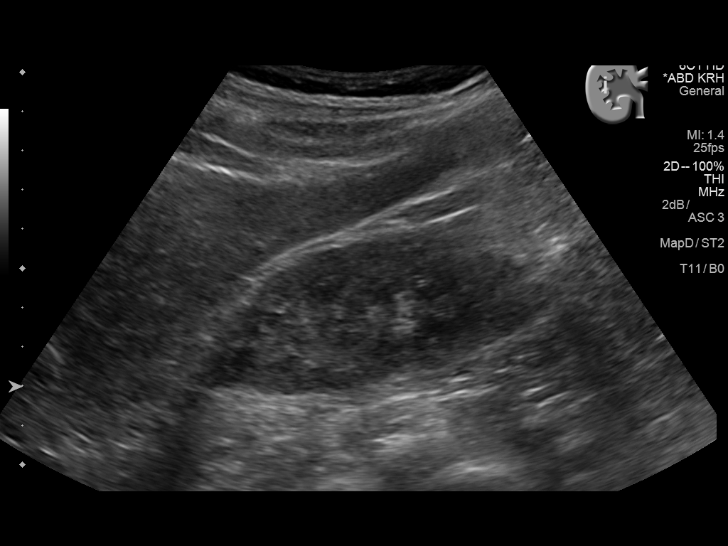
[im 60/72]
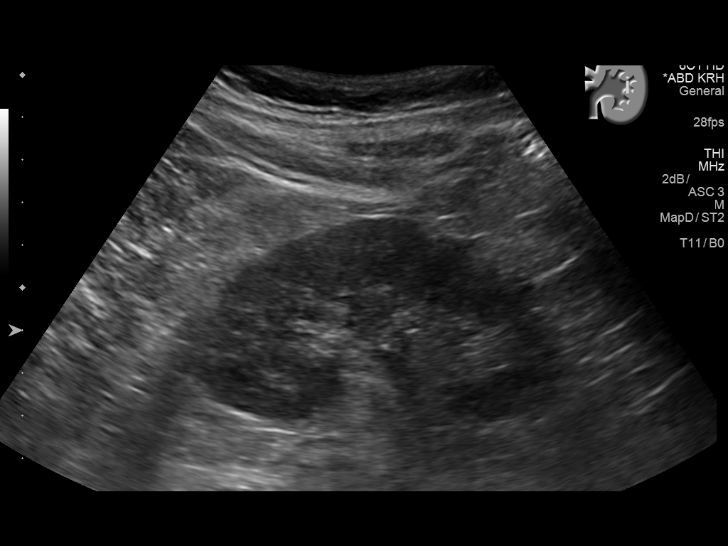
[im 66/72]
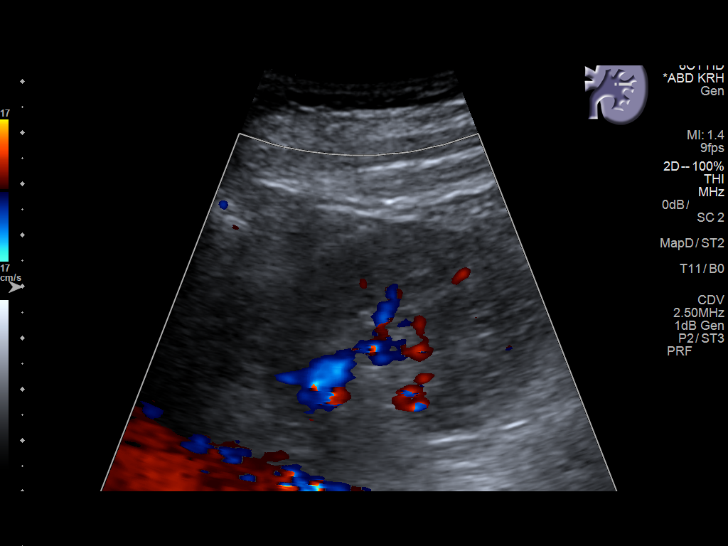
[im 72/72]
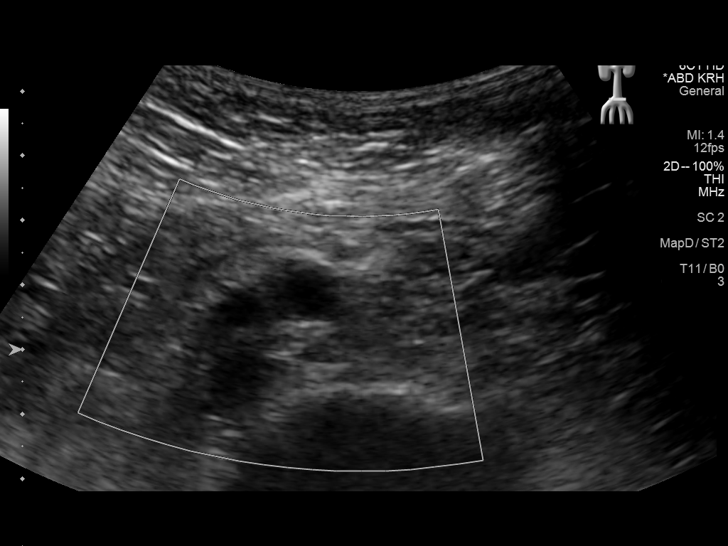

[14 of 25 positions shown; findings below may reference images not displayed]

FINDINGS: Gallbladder: No gallstones or wall thickening visualized. No
sonographic Murphy sign noted.

Common bile duct: Diameter: 3 mm

Liver: No focal lesion identified. Within normal limits in
parenchymal echogenicity.

IVC: No abnormality visualized.

Pancreas: Visualized portion unremarkable.

Spleen: Size and appearance within normal limits.

Right Kidney: Length: 10.5 cm. Echogenicity within normal limits. No
mass or hydronephrosis visualized.

Left Kidney: Length: 10.1 cm. Echogenicity within normal limits. No
mass or hydronephrosis visualized.

Abdominal aorta: No aneurysm visualized.

Other findings: Exam limited by body habitus.
IMPRESSION: Negative exam.

## 2016-07-20 ENCOUNTER — Ambulatory Visit (HOSPITAL_BASED_OUTPATIENT_CLINIC_OR_DEPARTMENT_OTHER): Payer: Medicare Other

## 2016-07-30 ENCOUNTER — Ambulatory Visit (HOSPITAL_BASED_OUTPATIENT_CLINIC_OR_DEPARTMENT_OTHER)
Admission: RE | Admit: 2016-07-30 | Discharge: 2016-07-30 | Disposition: A | Discharge status: Home / Self Care | Payer: Medicare Other | Source: Ambulatory Visit | Attending: Family Medicine | Admitting: Family Medicine

## 2016-07-30 DIAGNOSIS — M85862 Other specified disorders of bone density and structure, left lower leg: Secondary | ICD-10-CM | POA: Diagnosis not present

## 2016-07-30 DIAGNOSIS — Z1231 Encounter for screening mammogram for malignant neoplasm of breast: Secondary | ICD-10-CM | POA: Insufficient documentation

## 2016-07-30 DIAGNOSIS — E2839 Other primary ovarian failure: Secondary | ICD-10-CM

## 2016-08-01 ENCOUNTER — Other Ambulatory Visit: Payer: Self-pay | Admitting: Family Medicine

## 2016-08-01 DIAGNOSIS — R928 Other abnormal and inconclusive findings on diagnostic imaging of breast: Secondary | ICD-10-CM

## 2016-08-08 ENCOUNTER — Ambulatory Visit
Admission: RE | Admit: 2016-08-08 | Discharge: 2016-08-08 | Disposition: A | Discharge status: Home / Self Care | Payer: Medicare Other | Source: Ambulatory Visit | Attending: Family Medicine | Admitting: Family Medicine

## 2016-08-08 DIAGNOSIS — R928 Other abnormal and inconclusive findings on diagnostic imaging of breast: Secondary | ICD-10-CM

## 2016-08-16 ENCOUNTER — Other Ambulatory Visit: Payer: Self-pay | Admitting: Family Medicine

## 2016-08-16 DIAGNOSIS — F418 Other specified anxiety disorders: Secondary | ICD-10-CM

## 2016-08-16 DIAGNOSIS — I10 Essential (primary) hypertension: Secondary | ICD-10-CM

## 2016-08-16 DIAGNOSIS — G4700 Insomnia, unspecified: Secondary | ICD-10-CM

## 2016-08-16 DIAGNOSIS — M949 Disorder of cartilage, unspecified: Secondary | ICD-10-CM

## 2016-08-16 DIAGNOSIS — R739 Hyperglycemia, unspecified: Secondary | ICD-10-CM

## 2016-08-16 DIAGNOSIS — M899 Disorder of bone, unspecified: Secondary | ICD-10-CM

## 2016-09-12 IMAGING — DX DG LUMBAR SPINE COMPLETE 4+V
5 series · 5 of 5 positions shown · non-contrast
Comparison: None.

CLINICAL DATA: Low back pain with bilateral leg radiculopathy for 3
months, no known injury, initial encounter

EXAM:
LUMBAR SPINE - COMPLETE 4+ VIEW

[l-spine ap]
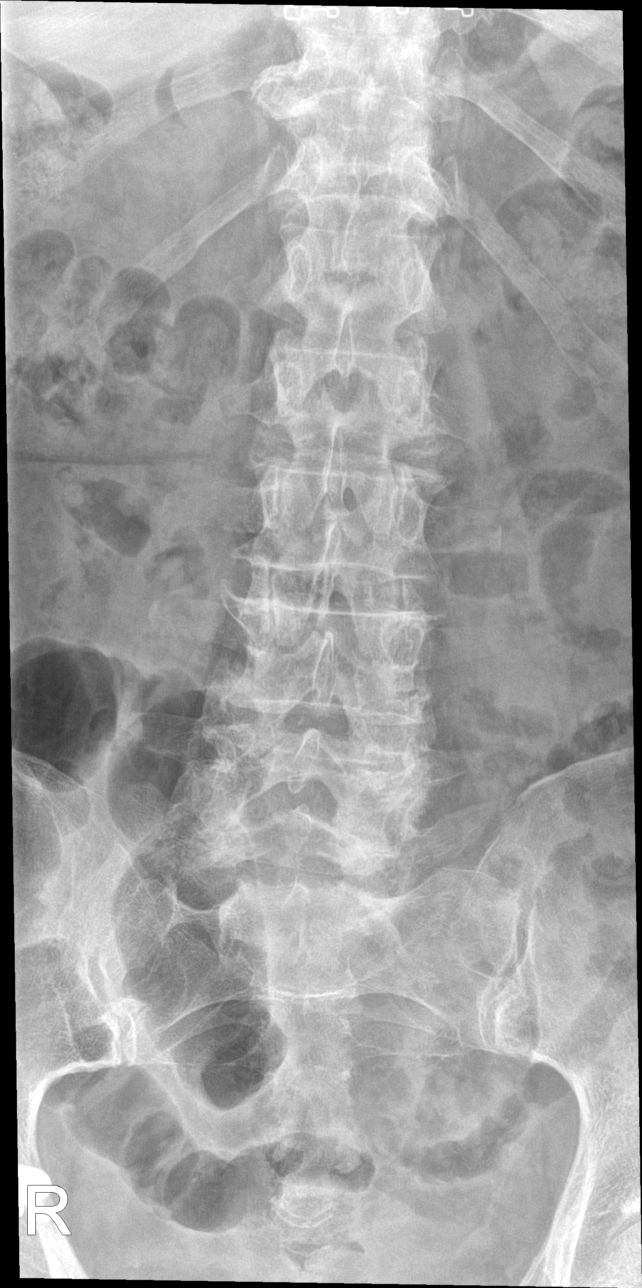

[l-spine obl (1 of 2)]
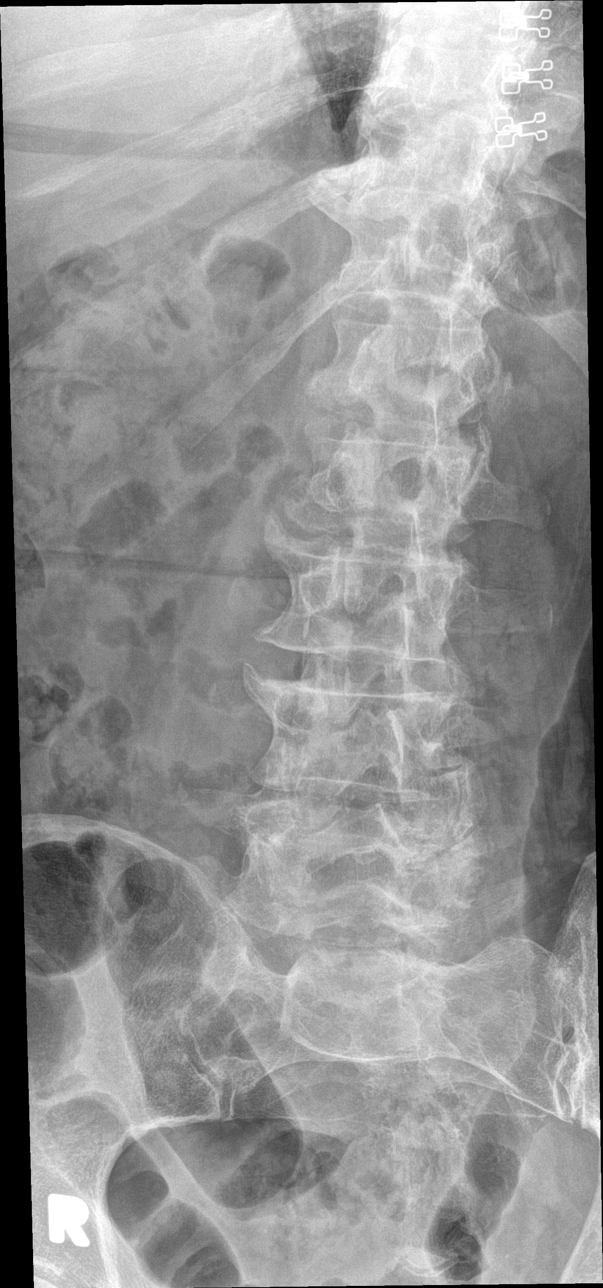

[l-spine obl (2 of 2)]
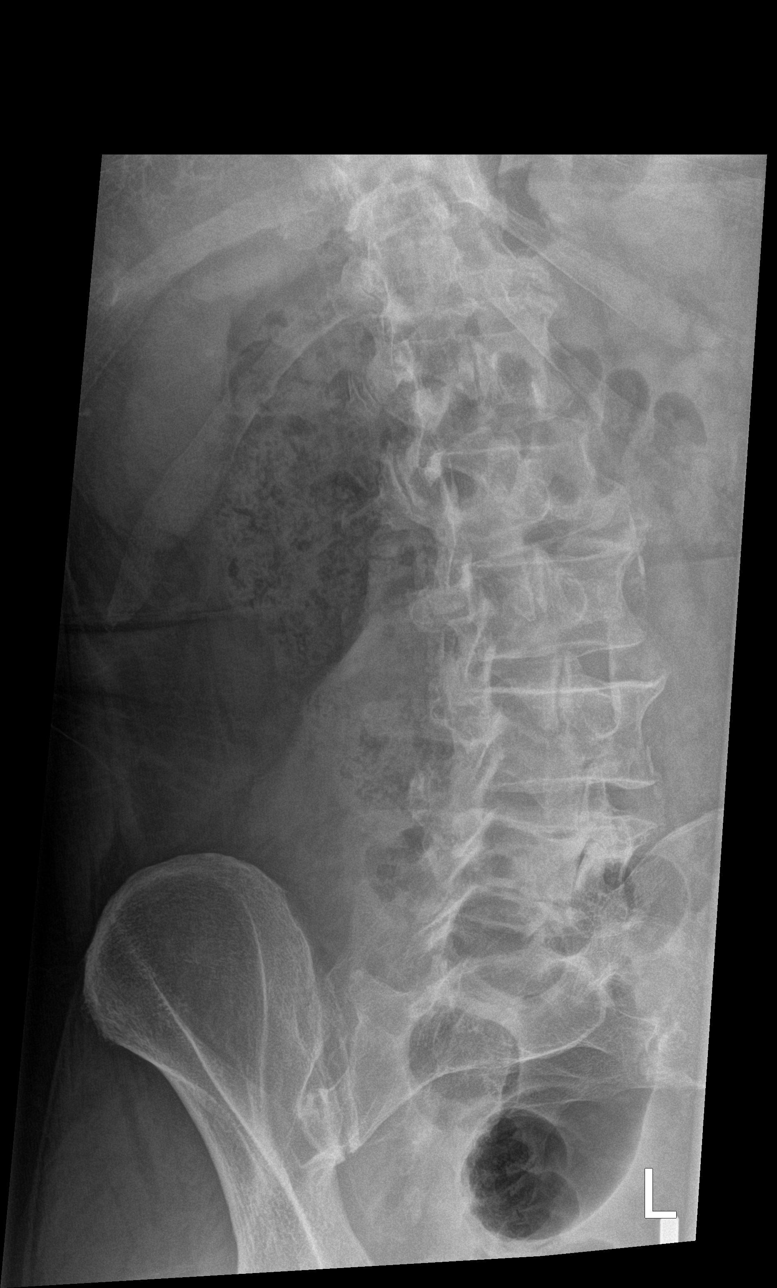

[l-spine lat]
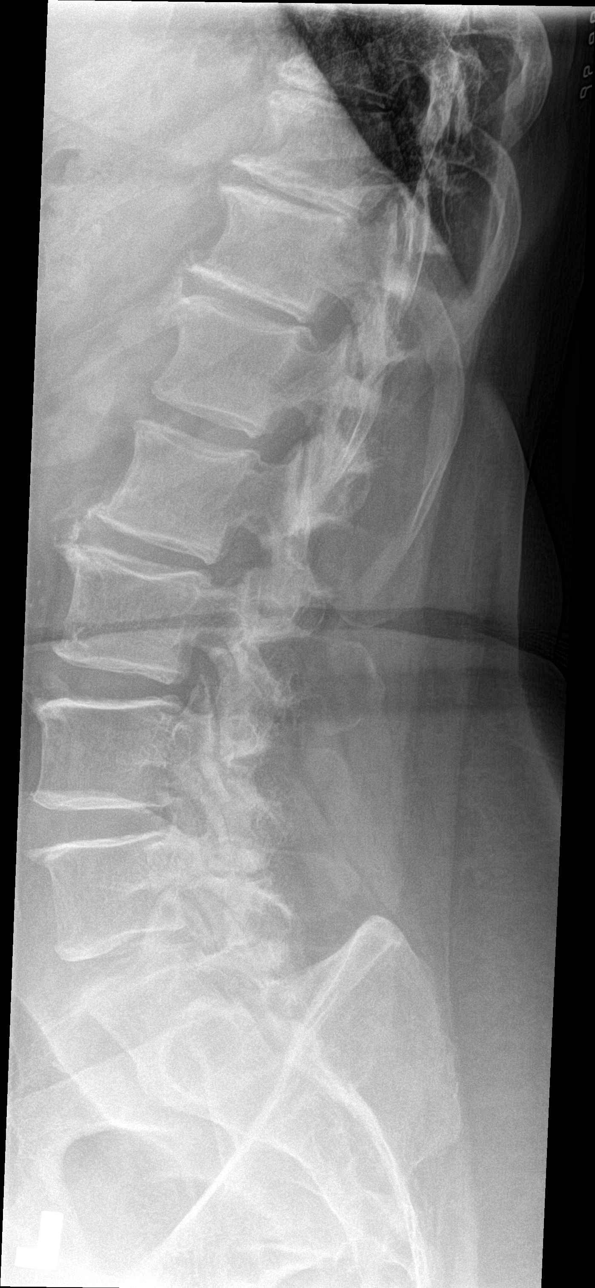

[l-spine spot]
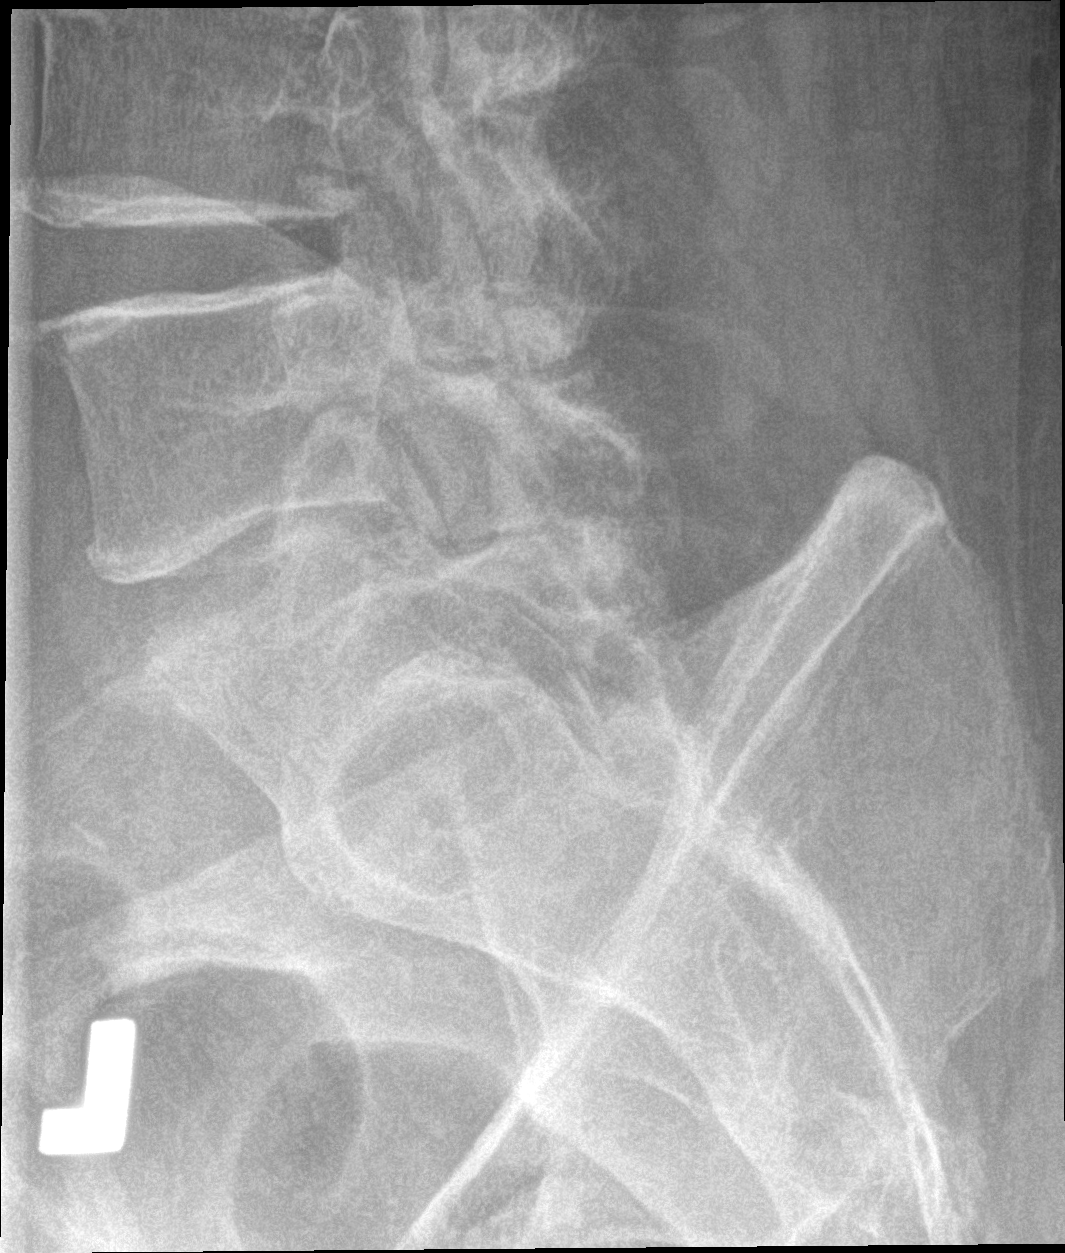

[5 of 5 positions shown; findings below may reference images not displayed]

FINDINGS: Five lumbar type vertebral bodies are well visualized. Vertebral
body height is well maintained. No spondylolysis or
spondylolisthesis is seen. Mild osteophytic changes are noted at
multiple levels. No soft tissue abnormality is seen.
IMPRESSION: Multilevel degenerative change without acute abnormality.

## 2016-10-04 NOTE — Progress Notes (Signed)
Pre visit review using our clinic review tool, if applicable. No additional management support is needed unless otherwise documented below in the visit note. 

## 2016-10-04 NOTE — Progress Notes (Signed)
Subjective:   Christina [name] is a 76 y.o. female who presents for Medicare Annual (Subsequent) preventive examination.  Pt reports overall doing well. She has noticed some intermittent palpitations for about 1 week when she is playing on her tablet in the evenings. She has remote hx of same. No associated SOB or CP. They resolve spontaneously.  She is following w/ Dr. Donne Hazel [facility]) for umbilical and midline hernias. Today, she reports some intermittent RUQ pain. She has some concerns over what exercises she can do given her hernias.   Review of Systems:  No ROS.  Medicare Wellness Visit.  Cardiac Risk Factors include: advanced age (>16men, >53 women);obesity (BMI >30kg/m2)  Sleep patterns: no sleep issues and sleeps 7-8 hours nightly. Sometimes gets up to void.  Home Safety/Smoke Alarms: Feels safe in home. Smoke alarms in place. Security system in place.  Living environment; residence and Firearm Safety: Lives alone in one story house w/ basement. No issues navigating stairs. Seat Belt Safety/Bike Helmet: Wears seat belt.   Counseling:   Eye Exam- Dr. Monna Fam  Dental- Dr. Otila Kluver every 6 months  Female:   Pap- Aged out  [place]- last 07/30/16. BI-RADS CATEGORY 0: Incomplete. >> [place] completed, BI-RADS Category 2: Benign.       Dexa scan- last 07/30/16. Osteopenia.        CCS- Cologuard completed 02/15/15, negative.    Objective:     Vitals: BP (!) 156/72 (BP Location: Right Arm, Patient Position: Sitting, Cuff Size: Normal)   Pulse 69   Temp 97.5 F (36.4 C) (Oral)   Resp 18   Ht 5\' 5"  (1.651 m)   Wt 187 lb (84.8 kg)   SpO2 98%   BMI 31.12 kg/m   Body mass index is 31.12 kg/m.  Wt Readings from Last 3 Encounters:  10/05/16 187 lb (84.8 kg)  04/05/16 185 lb 8 oz (84.1 kg)  10/07/15 180 lb (81.6 kg)   BP Readings from Last 3 Encounters:  10/05/16 (!) 156/72  04/05/16 130/72  10/07/15 (!) 142/71    Tobacco History  Smoking Status  .  Former Smoker  . Packs/day: 1.00  . Years: 30.00  . Types: Cigarettes  . Quit date: 08/27/1990  Smokeless Tobacco  . Never Used     Counseling given: Not Answered   Past Medical History:  Diagnosis Date  . Asthma    a little?  . Cervical cancer screening 01/31/2012  . Chicken pox as a child  . Depression with anxiety 06/07/2009   Qualifier: Diagnosis of  By: [name] MD, [name]    . Dermatitis of external ear 07/11/2012  . Dysphagia 01/31/2012  . Hiatal hernia with gastroesophageal reflux 10/26/2010   Qualifier: Diagnosis of  By: [name] MD, [name]    . Hyperglycemia 06/21/2013  . Hyperlipidemia, mixed 10/16/2015  . Hypertension   . Left hip pain 07/10/2015  . Low back pain 05/29/2015  . Measles as a child  . Mumps as a child  . Neck pain on left side 06/21/2013  . Overweight (BMI 25.0-29.9) 10/07/2008   Qualifier: Diagnosis of  By: [name] MD, [name]    . Preventative health care 01/31/2012  . Routine health maintenance 01/31/2012  . RUQ pain 05/29/2015  . Sinusitis, acute 07/10/2012  . Umbilical hernia   . Urinary frequency 09/28/2011  . Vertigo    Past Surgical History:  Procedure Laterality Date  . BREAST BIOPSY  1960, 70's  . CYSTECTOMY     abdomen  .  TUBAL LIGATION  1970   Family History  Problem Relation Age of Onset  . Cancer Mother 42    lung- smoker, ovarian  . Alcohol abuse Father   . Cancer Sister     ovarian  . Diabetes Sister   . Hypertension Sister   . Other Maternal Grandmother     pacemaker  . Multiple sclerosis Maternal Grandfather     ?  [name] Asthma Paternal Uncle    History  Sexual Activity  . Sexual activity: No    Comment: lives alone, no dietary restrictions    Outpatient Encounter Prescriptions as of 10/05/2016  Medication Sig  . ALPRAZolam (XANAX) 0.5 MG tablet TAKE 0.5-1 TABLET BY MOUTH TWICE A DAY AS NEEDED FOR ANXIETY OR SLEEP  . fish oil-omega-3 fatty acids 1000 MG capsule Take 1 g by mouth daily.    [name] losartan (COZAAR) 50 MG  tablet Take 1 tablet (50 mg total) by mouth daily.  [name] losartan (COZAAR) 50 MG tablet TAKE 1 TABLET BY MOUTH EVERY DAY  . magnesium gluconate (MAGONATE) 500 MG tablet Take 500 mg by mouth daily.  . Probiotic Product (PROBIOTIC-10 PO) Take by mouth.   No facility-administered encounter medications on file as of 10/05/2016.     Activities of Daily Living In your present state of health, do you have any difficulty performing the following activities: 10/05/2016 04/05/2016  Hearing? N N  Vision? N N  Difficulty concentrating or making decisions? N N  Walking or climbing stairs? N N  Dressing or bathing? N N  Doing errands, shopping? N N  Preparing Food and eating ? N -  Using the Toilet? N -  In the past six months, have you accidently leaked urine? Y -  Do you have problems with loss of bowel control? N -  Managing your Medications? N -  Managing your Finances? N -  Housekeeping or managing your Housekeeping? N -  Some recent data might be hidden    Patient Care Team: Mosie Lukes, MD as PCP - General (Family Medicine) Rolm Bookbinder, MD as Consulting Physician (General Surgery) Monna Fam, MD as Consulting Physician (Ophthalmology) Garry Heater, DDS as Consulting Physician (Dentistry)    Assessment:    Physical assessment deferred to PCP.  Exercise Activities and Dietary recommendations Current Exercise Habits: The patient does not participate in regular exercise at present (Researching gym memberships), Exercise limited by: orthopedic condition(s) (back pain)  Diet (meal preparation, eat out, water intake, caffeinated beverages, dairy products, fruits and vegetables): in general, a "healthy" diet  , well balanced, on average, 3 meals per day. Eats out frequently, tries to make healthy choices. Drinks water w/ meals. Drinks 3-6 cups coffee daily. Snacks throughout the day. Eats dark chocolate after meals.   Breakfast: varies; boiled egg w/ crackers and tomato; block of  cheese w/ crackers and banana; breakfast bar or cereal Lunch: Varies; sandwich Dinner: Varies; sandwich    Goals    . Increase physical activity    . Weight (lb) < 180 lb (81.6 kg)      Fall Risk Fall Risk  10/05/2016 04/05/2016 02/04/2015  Falls in the past year? No No No   Depression Screen PHQ 2/9 Scores 10/05/2016 04/05/2016 02/04/2015  PHQ - 2 Score 0 0 0     Cognitive Function MMSE - Mini Mental State Exam 10/05/2016  Orientation to time 5  Orientation to Place 5  Registration 3  Attention/ Calculation 5  Recall 3  Language- name 2  objects 2  Language- repeat 1  Language- follow 3 step command 3  Language- read & follow direction 1  Write a sentence 1  Copy design 1  Total score 30        Immunization History  Administered Date(s) Administered  . Influenza Whole 06/07/2009, 05/28/2011, 05/28/2012  . Influenza, High Dose Seasonal PF 05/20/2015  . Influenza,inj,Quad PF,36+ Mos 05/26/2013, 05/31/2014  . Pneumococcal Conjugate-13 05/31/2014  . Pneumococcal Polysaccharide-23 06/07/2009  . Td 01/31/2003  . Tdap 09/28/2011   Screening Tests Health Maintenance  Topic Date Due  . INFLUENZA VACCINE  11/24/2016 (Originally 03/27/2016)  . Fecal DNA (Cologuard)  02/14/2018  . MAMMOGRAM  07/30/2018  . TETANUS/TDAP  09/27/2021  . DEXA SCAN  Completed  . ZOSTAVAX  Addressed  . PNA vac Low Risk Adult  Completed      Plan:    Follow-up w/ PCP as directed.   Management of palpitations and RUQ pain per PCP/surgery-pt has PCP appt today following this visit. Discussed avoiding strenuous exercise and activities that put increased strain on abdominal cavity. Follow-up w/ Dr. Donne Hazel encouraged.  Declines flu shot-has been taking herbal "Flu Block" supplement.   Continue doing brain stimulating activities (puzzles, reading, adult coloring books, staying active) to keep memory sharp.   Bring a copy of your advance directives to your next office visit.  During the course of the  visit the patient was educated and counseled about the following appropriate screening and preventive services:   Vaccines to include Pneumoccal, Influenza, Hepatitis B, Td, Zostavax, HCV  Cardiovascular Disease  Colorectal cancer screening  Bone density screening  Diabetes screening  Glaucoma screening  Mammography/PAP  Nutrition counseling   Patient Instructions (the written plan) was given to the patient.   Dorrene German, RN  10/05/2016    RN AWV note reviewed. Agree with documention and plan.  Penni Homans, MD

## 2016-10-05 ENCOUNTER — Other Ambulatory Visit: Payer: Self-pay

## 2016-10-05 ENCOUNTER — Encounter: Payer: Self-pay | Admitting: Family Medicine

## 2016-10-05 ENCOUNTER — Ambulatory Visit (INDEPENDENT_AMBULATORY_CARE_PROVIDER_SITE_OTHER): Payer: Medicare Other | Admitting: Family Medicine

## 2016-10-05 VITALS — BP 154/62 | HR 69 | Temp 97.5°F | Resp 18 | Ht 65.0 in | Wt 187.0 lb

## 2016-10-05 DIAGNOSIS — R1011 Right upper quadrant pain: Secondary | ICD-10-CM

## 2016-10-05 DIAGNOSIS — R002 Palpitations: Secondary | ICD-10-CM

## 2016-10-05 DIAGNOSIS — Z0000 Encounter for general adult medical examination without abnormal findings: Secondary | ICD-10-CM

## 2016-10-05 DIAGNOSIS — E782 Mixed hyperlipidemia: Secondary | ICD-10-CM | POA: Diagnosis not present

## 2016-10-05 DIAGNOSIS — R739 Hyperglycemia, unspecified: Secondary | ICD-10-CM | POA: Diagnosis not present

## 2016-10-05 DIAGNOSIS — I10 Essential (primary) hypertension: Secondary | ICD-10-CM

## 2016-10-05 DIAGNOSIS — Z87891 Personal history of nicotine dependence: Secondary | ICD-10-CM

## 2016-10-05 LAB — CBC
HCT: 44.5 % (ref 36.0–46.0)
Hemoglobin: 14.9 g/dL (ref 12.0–15.0)
MCHC: 33.6 g/dL (ref 30.0–36.0)
MCV: 95.2 fl (ref 78.0–100.0)
Platelets: 254 10*3/uL (ref 150.0–400.0)
RBC: 4.67 Mil/uL (ref 3.87–5.11)
RDW: 13.6 % (ref 11.5–15.5)
WBC: 5.9 10*3/uL (ref 4.0–10.5)

## 2016-10-05 LAB — COMPREHENSIVE METABOLIC PANEL
ALT: 12 U/L (ref 0–35)
AST: 17 U/L (ref 0–37)
Albumin: 4.2 g/dL (ref 3.5–5.2)
Alkaline Phosphatase: 70 U/L (ref 39–117)
BUN: 20 mg/dL (ref 6–23)
CO2: 29 mEq/L (ref 19–32)
Calcium: 10.3 mg/dL (ref 8.4–10.5)
Chloride: 104 mEq/L (ref 96–112)
Creatinine, Ser: 0.79 mg/dL (ref 0.40–1.20)
GFR: 75.45 mL/min (ref >60.00)
Glucose, Bld: 106 mg/dL — ABNORMAL HIGH (ref 70–99)
Potassium: 4.1 mEq/L (ref 3.5–5.1)
Sodium: 139 mEq/L (ref 135–145)
Total Bilirubin: 1 mg/dL (ref 0.2–1.2)
Total Protein: 7 g/dL (ref 6.0–8.3)

## 2016-10-05 LAB — LIPID PANEL
Cholesterol: 187 mg/dL (ref 0–200)
HDL: 68.9 mg/dL (ref >39.00)
LDL Cholesterol: 105 mg/dL — ABNORMAL HIGH (ref 0–99)
NonHDL: 118.18
Total CHOL/HDL Ratio: 3
Triglycerides: 67 mg/dL (ref 0.0–149.0)
VLDL: 13.4 mg/dL (ref 0.0–40.0)

## 2016-10-05 LAB — TSH: TSH: 1.51 u[IU]/mL (ref 0.35–4.50)

## 2016-10-05 LAB — HEMOGLOBIN A1C: Hgb A1c MFr Bld: 6 % (ref 4.6–6.5)

## 2016-10-05 MED ORDER — TRIAMCINOLONE 0.1 % CREAM:EUCERIN CREAM 1:1: 1.0000 "application " | TOPICAL_CREAM | Freq: Two times a day (BID) | CUTANEOUS | 5 refills | Status: DC | PRN

## 2016-10-05 NOTE — Patient Instructions (Addendum)
Christina [name] , Thank you for taking time to come for your Medicare Wellness Visit. I appreciate your ongoing commitment to your health goals. Please review the following plan we discussed and let me know if I can assist you in the future.   Bring a copy of your advance directives to your next office visit.  These are the goals we discussed: Goals    . Increase physical activity    . Weight (lb) < 180 lb (81.6 kg)       This is a list of the screening recommended for you and due dates:  Health Maintenance  Topic Date Due  . Flu Shot  11/24/2016*  . Cologuard (Stool DNA test)  02/14/2018  . Mammogram  07/30/2018  . Tetanus Vaccine  09/27/2021  . DEXA scan (bone density measurement)  Completed  . Shingles Vaccine  Addressed  . Pneumonia vaccines  Completed  *Topic was postponed. The date shown is not the original due date.

## 2016-10-05 NOTE — Assessment & Plan Note (Addendum)
Has occurred off and on for years, recurred last week, occurs in evening lasts about 5 minutes then resolves no associated symptomsl. Check echo

## 2016-10-05 NOTE — Assessment & Plan Note (Signed)
minimize simple carbs. Increase exercise as tolerated.  

## 2016-10-05 NOTE — Assessment & Plan Note (Addendum)
Persistent and intermittent. After pizza last night had a pain for a couple of hours before resolving was 8 ot 10 check ultrasound today

## 2016-10-05 NOTE — Assessment & Plan Note (Signed)
Well controlled, no changes to meds. Encouraged heart healthy diet such as the DASH diet and exercise as tolerated.  °

## 2016-10-05 NOTE — Assessment & Plan Note (Signed)
Encouraged heart healthy diet, increase exercise, avoid trans fats, consider a krill oil cap daily 

## 2016-10-06 ENCOUNTER — Other Ambulatory Visit (HOSPITAL_BASED_OUTPATIENT_CLINIC_OR_DEPARTMENT_OTHER): Payer: Medicare Other

## 2016-10-07 DIAGNOSIS — Z87891 Personal history of nicotine dependence: Secondary | ICD-10-CM | POA: Insufficient documentation

## 2016-10-07 NOTE — Progress Notes (Signed)
Patient ID: Christina [name], female   DOB: 11/13/1940, 76 y.o.   MRN: [id number]   Subjective:    Patient ID: Christina [name], female    DOB: 1940-11-16, 76 y.o.   MRN: [id number]  Chief Complaint  Patient presents with  . Medicare Wellness  . Palpitations    x1 week at night while playing games on tablet and resting. Remote hx of same per chart review.  . Abdominal Pain    RUQ ongoing and intermittent. Pt w/ known umbilical and hiatal hernia.  . Follow-up    HPI Patient is in today for follow up on hyperlipidemia, hyperglycemia, cough and more. She notes a recent flare in palpitations without associated symptoms. They last up to an hour. She also notes a persistent cough nonproductive. Denies any recent febrile illness. No change in diet or activity level. She is noting trouble with RUQ pain intermittently without any pattern of eating certain foods or position changes. Denies CP/palp/SOB/HA/congestion/fevers/GI or GU c/o. Taking meds as prescribed  Past Medical History:  Diagnosis Date  . Asthma    a little?  . Cervical cancer screening 01/31/2012  . Chicken pox as a child  . Depression with anxiety 06/07/2009   Qualifier: Diagnosis of  By: [name] MD, [name]    . Dermatitis of external ear 07/11/2012  . Dysphagia 01/31/2012  . Hiatal hernia with gastroesophageal reflux 10/26/2010   Qualifier: Diagnosis of  By: [name] MD, [name]    . Hyperglycemia 06/21/2013  . Hyperlipidemia, mixed 10/16/2015  . Hypertension   . Left hip pain 07/10/2015  . Low back pain 05/29/2015  . Measles as a child  . Mumps as a child  . Neck pain on left side 06/21/2013  . Overweight (BMI 25.0-29.9) 10/07/2008   Qualifier: Diagnosis of  By: [name] MD, [name]    . Preventative health care 01/31/2012  . Routine health maintenance 01/31/2012  . RUQ pain 05/29/2015  . Sinusitis, acute 07/10/2012  . Umbilical hernia   . Urinary frequency 09/28/2011  . Vertigo     Past Surgical History:    Procedure Laterality Date  . BREAST BIOPSY  1960, 70's  . CYSTECTOMY     abdomen  . TUBAL LIGATION  1970    Family History  Problem Relation Age of Onset  . Cancer Mother 35    lung- smoker, ovarian  . Alcohol abuse Father   . Cancer Sister     ovarian  . Diabetes Sister   . Hypertension Sister   . Other Maternal Grandmother     pacemaker  . Multiple sclerosis Maternal Grandfather     ?  [name] Asthma Paternal Uncle     Social History   Social History  . Marital status: Widowed    Spouse name: N/A  . Number of children: N/A  . Years of education: N/A   Occupational History  . Not on file.   Social History Main Topics  . Smoking status: Former Smoker    Packs/day: 1.00    Years: 30.00    Types: Cigarettes    Quit date: 08/27/1990  . Smokeless tobacco: Never Used  . Alcohol use No  . Drug use: No  . Sexual activity: No     Comment: lives alone, no dietary restrictions   Other Topics Concern  . Not on file   Social History Narrative  . No narrative on file    Outpatient Medications Prior to Visit  Medication Sig Dispense Refill  . ALPRAZolam Duanne Moron)  0.5 MG tablet TAKE 0.5-1 TABLET BY MOUTH TWICE A DAY AS NEEDED FOR ANXIETY OR SLEEP 60 tablet 1  . fish oil-omega-3 fatty acids 1000 MG capsule Take 1 g by mouth daily.      [name] losartan (COZAAR) 50 MG tablet Take 1 tablet (50 mg total) by mouth daily. 90 tablet 3  . magnesium gluconate (MAGONATE) 500 MG tablet Take 500 mg by mouth daily.    . Probiotic Product (PROBIOTIC-10 PO) Take by mouth.    . losartan (COZAAR) 50 MG tablet TAKE 1 TABLET BY MOUTH EVERY DAY 90 tablet 0   No facility-administered medications prior to visit.     Allergies  Allergen Reactions  . Hydrochlorothiazide Cough  . Lisinopril Cough    Review of Systems  Constitutional: Negative for fever and malaise/fatigue.  HENT: Negative for congestion.   Eyes: Negative for blurred vision.  Respiratory: Positive for cough. Negative for  shortness of breath.   Cardiovascular: Negative for chest pain, palpitations and leg swelling.  Gastrointestinal: Negative for abdominal pain, blood in stool and nausea.  Genitourinary: Negative for dysuria and frequency.  Musculoskeletal: Negative for falls.  Skin: Positive for rash.  Neurological: Negative for dizziness, loss of consciousness and headaches.  Endo/Heme/Allergies: Negative for environmental allergies.  Psychiatric/Behavioral: Negative for depression. The patient is not nervous/anxious.        Objective:    Physical Exam  Constitutional: She is oriented to person, place, and time. She appears well-developed and well-nourished. No distress.  HENT:  Head: Normocephalic and atraumatic.  Nose: Nose normal.  Eyes: Right eye exhibits no discharge. Left eye exhibits no discharge.  Neck: Normal range of motion. Neck supple.  Cardiovascular: Normal rate and regular rhythm.   No murmur heard. Pulmonary/Chest: Effort normal and breath sounds normal.  Abdominal: Soft. Bowel sounds are normal. There is no tenderness.  Musculoskeletal: She exhibits no edema.  Neurological: She is alert and oriented to person, place, and time.  Skin: Skin is warm and dry.  Psychiatric: She has a normal mood and affect.  Nursing note and vitals reviewed.   BP (!) 154/62 (BP Location: Right Arm, Patient Position: Sitting, Cuff Size: Normal)   Pulse 69   Temp 97.5 F (36.4 C) (Oral)   Resp 18   Ht 5\' 5"  (1.651 m)   Wt 187 lb (84.8 kg)   SpO2 98%   BMI 31.12 kg/m  Wt Readings from Last 3 Encounters:  10/05/16 187 lb (84.8 kg)  04/05/16 185 lb 8 oz (84.1 kg)  10/07/15 180 lb (81.6 kg)     Lab Results  Component Value Date   WBC 5.9 10/05/2016   HGB 14.9 10/05/2016   HCT 44.5 10/05/2016   PLT 254.0 10/05/2016   GLUCOSE 106 (H) 10/05/2016   CHOL 187 10/05/2016   TRIG 67.0 10/05/2016   HDL 68.90 10/05/2016   LDLCALC 105 (H) 10/05/2016   ALT 12 10/05/2016   AST 17 10/05/2016   NA  139 10/05/2016   K 4.1 10/05/2016   CL 104 10/05/2016   CREATININE 0.79 10/05/2016   BUN 20 10/05/2016   CO2 29 10/05/2016   TSH 1.51 10/05/2016   HGBA1C 6.0 10/05/2016    Lab Results  Component Value Date   TSH 1.51 10/05/2016   Lab Results  Component Value Date   WBC 5.9 10/05/2016   HGB 14.9 10/05/2016   HCT 44.5 10/05/2016   MCV 95.2 10/05/2016   PLT 254.0 10/05/2016   Lab Results  Component  Value Date   NA 139 10/05/2016   K 4.1 10/05/2016   CO2 29 10/05/2016   GLUCOSE 106 (H) 10/05/2016   BUN 20 10/05/2016   CREATININE 0.79 10/05/2016   BILITOT 1.0 10/05/2016   ALKPHOS 70 10/05/2016   AST 17 10/05/2016   ALT 12 10/05/2016   PROT 7.0 10/05/2016   ALBUMIN 4.2 10/05/2016   CALCIUM 10.3 10/05/2016   GFR 75.45 10/05/2016   Lab Results  Component Value Date   CHOL 187 10/05/2016   Lab Results  Component Value Date   HDL 68.90 10/05/2016   Lab Results  Component Value Date   LDLCALC 105 (H) 10/05/2016   Lab Results  Component Value Date   TRIG 67.0 10/05/2016   Lab Results  Component Value Date   CHOLHDL 3 10/05/2016   Lab Results  Component Value Date   HGBA1C 6.0 10/05/2016       Assessment & Plan:   Problem List Items Addressed This Visit    Essential hypertension, benign    Well controlled, no changes to meds. Encouraged heart healthy diet such as the DASH diet and exercise as tolerated.       Relevant Orders   CBC (Completed)   Comprehensive metabolic panel (Completed)   TSH (Completed)   PALPITATIONS, OCCASIONAL    Has occurred off and on for years, recurred last week, occurs in evening lasts about 5 minutes then resolves no associated symptomsl. Check echo      Relevant Orders   ECHOCARDIOGRAM COMPLETE   Hyperglycemia     minimize simple carbs. Increase exercise as tolerated.      Relevant Orders   Hemoglobin A1c (Completed)   RUQ pain    Persistent and intermittent. After pizza last night had a pain for a couple of hours  before resolving was 8 ot 10 check ultrasound today      Relevant Orders   US Abdomen Complete   Hyperlipidemia, mixed    Encouraged heart healthy diet, increase exercise, avoid trans fats, consider a krill oil cap daily      Relevant Orders   Lipid panel (Completed)   H/O tobacco use, presenting hazards to health    Smoked 1.5 ppd from age 42 to 50. Will refer for low dose CT scan program       Other Visit Diagnoses    Encounter for Medicare annual wellness exam    -  Primary      I am having Ms. Herschberger maintain her fish oil-omega-3 fatty acids, magnesium gluconate, Probiotic Product (PROBIOTIC-10 PO), losartan, and ALPRAZolam.  No orders of the defined types were placed in this encounter.     Penni Homans, MD

## 2016-10-07 NOTE — Assessment & Plan Note (Signed)
Smoked 1.5 ppd from age 76 to 89. Will refer for low dose CT scan program

## 2016-10-09 ENCOUNTER — Ambulatory Visit (HOSPITAL_BASED_OUTPATIENT_CLINIC_OR_DEPARTMENT_OTHER)
Admission: RE | Admit: 2016-10-09 | Discharge: 2016-10-09 | Disposition: A | Discharge status: Home / Self Care | Payer: Medicare Other | Source: Ambulatory Visit | Attending: Family Medicine | Admitting: Family Medicine

## 2016-10-09 ENCOUNTER — Other Ambulatory Visit: Payer: Self-pay | Admitting: Family Medicine

## 2016-10-09 DIAGNOSIS — N200 Calculus of kidney: Secondary | ICD-10-CM | POA: Diagnosis not present

## 2016-10-09 DIAGNOSIS — R1011 Right upper quadrant pain: Secondary | ICD-10-CM | POA: Diagnosis present

## 2016-10-09 MED ORDER — TRIAMCINOLONE 0.1 % CREAM:EUCERIN CREAM 1:1: 1.0000 "application " | TOPICAL_CREAM | Freq: Two times a day (BID) | CUTANEOUS | 5 refills | Status: DC | PRN

## 2016-10-10 ENCOUNTER — Ambulatory Visit (HOSPITAL_BASED_OUTPATIENT_CLINIC_OR_DEPARTMENT_OTHER)
Admission: RE | Admit: 2016-10-10 | Discharge: 2016-10-10 | Disposition: A | Discharge status: Home / Self Care | Payer: Medicare Other | Source: Ambulatory Visit | Attending: Family Medicine | Admitting: Family Medicine

## 2016-10-10 DIAGNOSIS — E785 Hyperlipidemia, unspecified: Secondary | ICD-10-CM | POA: Diagnosis not present

## 2016-10-10 DIAGNOSIS — E119 Type 2 diabetes mellitus without complications: Secondary | ICD-10-CM | POA: Insufficient documentation

## 2016-10-10 DIAGNOSIS — R002 Palpitations: Secondary | ICD-10-CM | POA: Insufficient documentation

## 2016-10-10 DIAGNOSIS — I10 Essential (primary) hypertension: Secondary | ICD-10-CM | POA: Diagnosis not present

## 2016-10-11 ENCOUNTER — Telehealth: Payer: Self-pay | Admitting: Family Medicine

## 2016-10-11 DIAGNOSIS — L309 Dermatitis, unspecified: Secondary | ICD-10-CM

## 2016-10-11 NOTE — Telephone Encounter (Signed)
OK to refill the plain Triamcinolone in her list with largest tube they have and one refill

## 2016-10-11 NOTE — Telephone Encounter (Signed)
We sent in the Triamcinolone Acetonide 0.1% [id number] cream---- But pharmacist states this compound is too expensive and she just wants the Kenalog 0.1% refilled.

## 2016-10-12 MED ORDER — TRIAMCINOLONE ACETONIDE 0.1 % EX CREA: TOPICAL_CREAM | Freq: Two times a day (BID) | CUTANEOUS | 0 refills | Status: DC

## 2016-10-12 NOTE — Telephone Encounter (Signed)
Refill done.  

## 2016-11-21 ENCOUNTER — Ambulatory Visit (INDEPENDENT_AMBULATORY_CARE_PROVIDER_SITE_OTHER): Payer: Medicare Other | Admitting: Family

## 2016-11-21 ENCOUNTER — Telehealth: Payer: Self-pay | Admitting: Family Medicine

## 2016-11-21 ENCOUNTER — Encounter: Payer: Self-pay | Admitting: Family

## 2016-11-21 VITALS — BP 144/74 | HR 68 | Temp 98.7°F | Resp 16 | Ht 65.0 in | Wt 193.4 lb

## 2016-11-21 DIAGNOSIS — L0390 Cellulitis, unspecified: Secondary | ICD-10-CM | POA: Diagnosis not present

## 2016-11-21 DIAGNOSIS — I10 Essential (primary) hypertension: Secondary | ICD-10-CM

## 2016-11-21 NOTE — Progress Notes (Signed)
Pre visit review using our clinic review tool, if applicable. No additional management support is needed unless otherwise documented below in the visit note. 

## 2016-11-21 NOTE — Progress Notes (Signed)
Subjective:    Patient ID: Christina [name], female    DOB: 20-Nov-1940, 76 y.o.   MRN: [id number]  HPI  Christina [name] is a 76 yr old female who presents today with two concerns:  1) Nipple problem- reports that Saturday evening she developed pain on the nipple. Sunday AM develops "pus" and pain.  Reports that she lanced the boil with a needle.  She was seen in a clinic in [place]. She was given doxycycline on 11/19/16.    2) HTN- she is currently maintained on losartan.  BP Readings from Last 3 Encounters:  11/21/16 (!) 144/74  10/05/16 (!) 154/62  04/05/16 130/72      Review of Systems See HPI  Past Medical History:  Diagnosis Date  . Asthma    a little?  . Cervical cancer screening 01/31/2012  . Chicken pox as a child  . Depression with anxiety 06/07/2009   Qualifier: Diagnosis of  By: [name] MD, [name]    . Dermatitis of external ear 07/11/2012  . Dysphagia 01/31/2012  . Hiatal hernia with gastroesophageal reflux 10/26/2010   Qualifier: Diagnosis of  By: [name] MD, [name]    . Hyperglycemia 06/21/2013  . Hyperlipidemia, mixed 10/16/2015  . Hypertension   . Left hip pain 07/10/2015  . Low back pain 05/29/2015  . Measles as a child  . Mumps as a child  . Neck pain on left side 06/21/2013  . Overweight (BMI 25.0-29.9) 10/07/2008   Qualifier: Diagnosis of  By: [name] MD, [name]    . Preventative health care 01/31/2012  . Routine health maintenance 01/31/2012  . RUQ pain 05/29/2015  . Sinusitis, acute 07/10/2012  . Umbilical hernia   . Urinary frequency 09/28/2011  . Vertigo      Social History   Social History  . Marital status: Widowed    Spouse name: N/A  . Number of children: N/A  . Years of education: N/A   Occupational History  . Not on file.   Social History Main Topics  . Smoking status: Former Smoker    Packs/day: 1.00    Years: 30.00    Types: Cigarettes    Quit date: 08/27/1990  . Smokeless tobacco: Never Used  . Alcohol use No  . Drug  use: No  . Sexual activity: No     Comment: lives alone, no dietary restrictions   Other Topics Concern  . Not on file   Social History Narrative  . No narrative on file    Past Surgical History:  Procedure Laterality Date  . BREAST BIOPSY  1960, 70's  . CYSTECTOMY     abdomen  . TUBAL LIGATION  1970    Family History  Problem Relation Age of Onset  . Cancer Mother 47    lung- smoker, ovarian  . Alcohol abuse Father   . Cancer Sister     ovarian  . Diabetes Sister   . Hypertension Sister   . Other Maternal Grandmother     pacemaker  . Multiple sclerosis Maternal Grandfather     ?  [name] Asthma Paternal Uncle     Allergies  Allergen Reactions  . Hydrochlorothiazide Cough  . Lisinopril Cough    Current Outpatient Prescriptions on File Prior to Visit  Medication Sig Dispense Refill  . fish oil-omega-3 fatty acids 1000 MG capsule Take 1 g by mouth daily.      [name] losartan (COZAAR) 50 MG tablet Take 1 tablet (50 mg total) by mouth daily. [place]  tablet 3  . magnesium gluconate (MAGONATE) 500 MG tablet Take 500 mg by mouth daily.    . Probiotic Product (PROBIOTIC-10 PO) Take by mouth.    . Triamcinolone Acetonide (TRIAMCINOLONE 0.1 % CREAM : EUCERIN) CREA Apply 1 application topically 2 (two) times daily as needed. 1 each 5  . triamcinolone cream (KENALOG) 0.1 % Apply topically 2 (two) times daily. (Patient taking differently: Apply topically as needed. ) 80 g 0   No current facility-administered medications on file prior to visit.     BP (!) 144/74 (BP Location: Left Arm, Patient Position: Sitting, Cuff Size: Large)   Pulse 68   Temp 98.7 F (37.1 C) (Oral)   Resp 16   Ht 5\' 5"  (1.651 m)   Wt 193 lb 6.4 oz (87.7 kg)   SpO2 94%   BMI 32.18 kg/m       Objective:   Physical Exam  Constitutional: She appears well-developed and well-nourished. No distress.  Cardiovascular: Normal rate and regular rhythm.   No murmur heard. Pulmonary/Chest: Effort normal and breath  sounds normal. No respiratory distress. She has no wheezes. She has no rales.  breast:  Left nipple is swollen, small pustule note on left nipple at 11 oclock.  Mild surrounding areolar erythema.  No obvious mass in left breast, no fluctuance.         Assessment & Plan:  HTN-  Overall BP looks pretty good- acceptable for her age.   Better today than it was last visit.  Home BP ranges 110-156/52-72.  However most sbp's 130-140.   Cellulitis- Rx with doxycycline.  Plan 1 week follow up for re-evaluation. She is advised to call sooner if worsening symptoms.

## 2016-11-21 NOTE — Telephone Encounter (Signed)
Pt was seen today by [facility] and advised to come back in to follow up in a week. Pt says that she would like to see her primary provider if possible considering that she had to see someone else today.   Could provider please advise for scheduling?    CB: [phone]

## 2016-11-21 NOTE — Telephone Encounter (Signed)
I am not in the office next week would have to see her the week after if she wants to wait for me see if you can find anything. Dr Jacinto Reap

## 2016-11-21 NOTE — Patient Instructions (Signed)
Complete your antibiotics. Follow up in 1 week.  Call sooner if new/worsening pain redness, swelling or if you develop fever.  Continue warm compresses twice daily.

## 2016-12-03 ENCOUNTER — Ambulatory Visit (INDEPENDENT_AMBULATORY_CARE_PROVIDER_SITE_OTHER): Payer: Medicare Other | Admitting: Family Medicine

## 2016-12-03 ENCOUNTER — Encounter: Payer: Self-pay | Admitting: Family Medicine

## 2016-12-03 DIAGNOSIS — N649 Disorder of breast, unspecified: Secondary | ICD-10-CM

## 2016-12-03 DIAGNOSIS — R739 Hyperglycemia, unspecified: Secondary | ICD-10-CM

## 2016-12-03 DIAGNOSIS — I10 Essential (primary) hypertension: Secondary | ICD-10-CM | POA: Diagnosis not present

## 2016-12-03 HISTORY — DX: Disorder of breast, unspecified: N64.9

## 2016-12-03 NOTE — Assessment & Plan Note (Signed)
Well controlled, no changes to meds. Encouraged heart healthy diet such as the DASH diet and exercise as tolerated.  °

## 2016-12-03 NOTE — Assessment & Plan Note (Signed)
Left breast lesion with pus which since drained has helped the pain and swelling some but lesion has not resolved will proceed with diagnostic mgm

## 2016-12-03 NOTE — Progress Notes (Signed)
Patient ID: Christina [name], female   DOB: 11-11-1940, 76 y.o.   MRN: [id number]   Subjective:  I acted as a Education administrator for Penni Homans, [place], [place]   Patient ID: Christina [name], female    DOB: 09-13-1940, 76 y.o.   MRN: [id number]  Chief Complaint  Patient presents with  . Follow-up    F/U Medicare Wellness Visit.   . Nipple Concerns    Started 11/17/2016, L nipple was sore. Woke up Monday morning  with what appeared to be pus. Took a sewing needle to extract out the pus.     HPI  Patient is in today for a F/U from a Medicare Wellness Exam. Patient states that on 11/17/2016, left nipple was red, ;woke up the next morning and the nipple was sore. Woke up Monday morning  with what appeared to be pus. Took a sewing needle to extract out the pus. She notes it drained a bit and has felt better since then. No fevers, chills, malaise, anorexia or other concerns. Denies CP/palp/SOB/HA/congestion/fevers/GI or GU c/o. Taking meds as prescribed  Patient Care Team: Mosie Lukes, MD as PCP - General (Family Medicine) Rolm Bookbinder, MD as Consulting Physician (General Surgery) Monna Fam, MD as Consulting Physician (Ophthalmology) Garry Heater, DDS as Consulting Physician (Dentistry)   Past Medical History:  Diagnosis Date  . Asthma    a little?  . Cervical cancer screening 01/31/2012  . Chicken pox as a child  . Depression with anxiety 06/07/2009   Qualifier: Diagnosis of  By: [name] MD, [name]    . Dermatitis of external ear 07/11/2012  . Dysphagia 01/31/2012  . Hiatal hernia with gastroesophageal reflux 10/26/2010   Qualifier: Diagnosis of  By: [name] MD, [name]    . Hyperglycemia 06/21/2013  . Hyperlipidemia, mixed 10/16/2015  . Hypertension   . Left hip pain 07/10/2015  . Lesion of breast 12/03/2016  . Low back pain 05/29/2015  . Measles as a child  . Mumps as a child  . Neck pain on left side 06/21/2013  . Overweight (BMI 25.0-29.9) 10/07/2008   Qualifier:  Diagnosis of  By: [name] MD, [name]    . Preventative health care 01/31/2012  . Routine health maintenance 01/31/2012  . RUQ pain 05/29/2015  . Sinusitis, acute 07/10/2012  . Umbilical hernia   . Urinary frequency 09/28/2011  . Vertigo     Past Surgical History:  Procedure Laterality Date  . BREAST BIOPSY  1960, 70's  . CYSTECTOMY     abdomen  . TUBAL LIGATION  1970    Family History  Problem Relation Age of Onset  . Cancer Mother 95    lung- smoker, ovarian  . Alcohol abuse Father   . Cancer Sister     ovarian  . Diabetes Sister   . Hypertension Sister   . Other Maternal Grandmother     pacemaker  . Multiple sclerosis Maternal Grandfather     ?  [name] Asthma Paternal Uncle     Social History   Social History  . Marital status: Widowed    Spouse name: N/A  . Number of children: N/A  . Years of education: N/A   Occupational History  . Not on file.   Social History Main Topics  . Smoking status: Former Smoker    Packs/day: 1.00    Years: 30.00    Types: Cigarettes    Quit date: 08/27/1990  . Smokeless tobacco: Never Used  . Alcohol use No  . Drug  use: No  . Sexual activity: No     Comment: lives alone, no dietary restrictions   Other Topics Concern  . Not on file   Social History Narrative  . No narrative on file    Outpatient Medications Prior to Visit  Medication Sig Dispense Refill  . fish oil-omega-3 fatty acids 1000 MG capsule Take 1 g by mouth daily.      [name] losartan (COZAAR) 50 MG tablet Take 1 tablet (50 mg total) by mouth daily. 90 tablet 3  . magnesium gluconate (MAGONATE) 500 MG tablet Take 500 mg by mouth daily.    . Probiotic Product (PROBIOTIC-10 PO) Take by mouth.    . Triamcinolone Acetonide (TRIAMCINOLONE 0.1 % CREAM : EUCERIN) CREA Apply 1 application topically 2 (two) times daily as needed. 1 each 5  . triamcinolone cream (KENALOG) 0.1 % Apply topically 2 (two) times daily. (Patient taking differently: Apply topically as needed. ) 80 g 0     No facility-administered medications prior to visit.     Allergies  Allergen Reactions  . Hydrochlorothiazide Cough  . Lisinopril Cough    Review of Systems  Constitutional: Negative for fever and malaise/fatigue.  HENT: Negative for congestion.   Eyes: Negative for blurred vision.  Respiratory: Negative for cough and shortness of breath.   Cardiovascular: Negative for chest pain, palpitations and leg swelling.  Gastrointestinal: Negative for vomiting.  Musculoskeletal: Negative for back pain.  Skin: Negative for rash.       Patient states that on 11/17/2016, left nipple was red, woke up the next morning and the nipple was sore. Woke up Monday morning  with what appeared to be pus. Took a sewing needle to extract out the pus.  Neurological: Negative for loss of consciousness and headaches.       Objective:    Physical Exam  Constitutional: She is oriented to person, place, and time. She appears well-developed and well-nourished. No distress.  HENT:  Head: Normocephalic and atraumatic.  Eyes: Conjunctivae are normal.  Neck: Normal range of motion. No thyromegaly present.  Cardiovascular: Normal rate and regular rhythm.   Pulmonary/Chest: Effort normal and breath sounds normal. She has no wheezes.  Abdominal: Soft. Bowel sounds are normal. There is no tenderness.  Genitourinary:  Genitourinary Comments: Left breast 1 cm firm, mobile round lesion at 12 oclock. Also mild redness and fluctuation around nipple, right breat unremarkable  Musculoskeletal: She exhibits no edema or deformity.  Neurological: She is alert and oriented to person, place, and time.  Skin: Skin is warm and dry. She is not diaphoretic.  Psychiatric: She has a normal mood and affect.    BP 135/71 (BP Location: Left Arm, Patient Position: Sitting, Cuff Size: Normal)   Pulse 71   Temp 98.1 F (36.7 C) (Oral)   Wt 187 lb 9.6 oz (85.1 kg)   SpO2 96% Comment: RA  BMI 31.22 kg/m  Wt Readings from Last 3  Encounters:  12/03/16 187 lb 9.6 oz (85.1 kg)  11/21/16 193 lb 6.4 oz (87.7 kg)  10/05/16 187 lb (84.8 kg)   BP Readings from Last 3 Encounters:  12/03/16 135/71  11/21/16 (!) 144/74  10/05/16 (!) 154/62     Immunization History  Administered Date(s) Administered  . Influenza Whole 06/07/2009, 05/28/2011, 05/28/2012  . Influenza, High Dose Seasonal PF 05/20/2015  . Influenza,inj,Quad PF,36+ Mos 05/26/2013, 05/31/2014  . Pneumococcal Conjugate-13 05/31/2014  . Pneumococcal Polysaccharide-23 06/07/2009  . Td 01/31/2003  . Tdap 09/28/2011  Health Maintenance  Topic Date Due  . INFLUENZA VACCINE  03/27/2017  . Fecal DNA (Cologuard)  02/14/2018  . MAMMOGRAM  07/30/2018  . TETANUS/TDAP  09/27/2021  . DEXA SCAN  Completed  . PNA vac Low Risk Adult  Completed    Lab Results  Component Value Date   WBC 5.9 10/05/2016   HGB 14.9 10/05/2016   HCT 44.5 10/05/2016   PLT 254.0 10/05/2016   GLUCOSE 106 (H) 10/05/2016   CHOL 187 10/05/2016   TRIG 67.0 10/05/2016   HDL 68.90 10/05/2016   LDLCALC 105 (H) 10/05/2016   ALT 12 10/05/2016   AST 17 10/05/2016   NA 139 10/05/2016   K 4.1 10/05/2016   CL 104 10/05/2016   CREATININE 0.79 10/05/2016   BUN 20 10/05/2016   CO2 29 10/05/2016   TSH 1.51 10/05/2016   HGBA1C 6.0 10/05/2016    Lab Results  Component Value Date   TSH 1.51 10/05/2016   Lab Results  Component Value Date   WBC 5.9 10/05/2016   HGB 14.9 10/05/2016   HCT 44.5 10/05/2016   MCV 95.2 10/05/2016   PLT 254.0 10/05/2016   Lab Results  Component Value Date   NA 139 10/05/2016   K 4.1 10/05/2016   CO2 29 10/05/2016   GLUCOSE 106 (H) 10/05/2016   BUN 20 10/05/2016   CREATININE 0.79 10/05/2016   BILITOT 1.0 10/05/2016   ALKPHOS 70 10/05/2016   AST 17 10/05/2016   ALT 12 10/05/2016   PROT 7.0 10/05/2016   ALBUMIN 4.2 10/05/2016   CALCIUM 10.3 10/05/2016   GFR 75.45 10/05/2016   Lab Results  Component Value Date   CHOL 187 10/05/2016   Lab  Results  Component Value Date   HDL 68.90 10/05/2016   Lab Results  Component Value Date   LDLCALC 105 (H) 10/05/2016   Lab Results  Component Value Date   TRIG 67.0 10/05/2016   Lab Results  Component Value Date   CHOLHDL 3 10/05/2016   Lab Results  Component Value Date   HGBA1C 6.0 10/05/2016         Assessment & Plan:   Problem List Items Addressed This Visit    Essential hypertension, benign    Well controlled, no changes to meds. Encouraged heart healthy diet such as the DASH diet and exercise as tolerated.       Hyperglycemia     minimize simple carbs. Increase exercise as tolerated. Continue current meds      Lesion of breast    Left breast lesion with pus which since drained has helped the pain and swelling some but lesion has not resolved will proceed with diagnostic mgm      Relevant Orders   MM Digital Diagnostic Unilat L      I have discontinued Ms. Escamilla doxycycline. I am also having her maintain her fish oil-omega-3 fatty acids, magnesium gluconate, Probiotic Product (PROBIOTIC-10 PO), losartan, triamcinolone 0.1 % cream : eucerin, and triamcinolone cream.  Meds ordered this encounter  Medications  . DISCONTD: doxycycline (VIBRA-TABS) 100 MG tablet    Sig: Take 100 mg by mouth 2 (two) times daily.    Refill:  0    CMA served as Education administrator during this visit. History, Physical and Plan performed by medical provider. Documentation and orders reviewed and attested to.  Penni Homans, MD

## 2016-12-03 NOTE — Progress Notes (Signed)
Pre visit review using our clinic review tool, if applicable. No additional management support is needed unless otherwise documented below in the visit note. 

## 2016-12-03 NOTE — Patient Instructions (Signed)
Mastitis  Mastitis is inflammation of the breast tissue. It occurs most often in women who are breastfeeding, but it can also affect other women, and even sometimes men.  What are the causes?  Mastitis is usually caused by a bacterial infection. Bacteria enter the breast tissue through cuts or openings in the skin. Typically, this occurs with breastfeeding because of cracked or irritated skin. Sometimes, it can occur even when there is no opening in the skin. It can be associated with plugged milk (lactiferous) ducts. Nipple piercing can also lead to mastitis. Also, some forms of breast cancer can cause mastitis.  What are the signs or symptoms?  · Swelling, redness, tenderness, and pain in an area of the breast.  · Swelling of the glands under the arm on the same side.  · Fever.  If an infection is allowed to progress, a collection of pus (abscess) may develop.  How is this diagnosed?  Your health care provider can usually diagnose mastitis based on your symptoms and a physical exam. Tests may be done to help confirm the diagnosis. These may include:  · Removal of pus from the breast by applying pressure to the area. This pus can be examined in the lab to determine which bacteria are present. If an abscess has developed, the fluid in the abscess can be removed with a needle. This can also be used to confirm the diagnosis and determine the bacteria present. In most cases, pus will not be present.  · Blood tests to determine if your body is fighting a bacterial infection.  · Mammogram or ultrasound tests to rule out other problems or diseases.    How is this treated?  Antibiotic medicine is used to treat a bacterial infection. Your health care provider will determine which bacteria are most likely causing the infection and will select an appropriate antibiotic. This is sometimes changed based on the results of tests performed to identify the bacteria, or if there is no response to the antibiotic selected. Antibiotics  are usually given by mouth. You may also be given medicine for pain.  Mastitis that occurs with breastfeeding will sometimes go away on its own, so your health care provider may choose to wait 24 hours after first seeing you to decide whether a prescription medicine is needed.  Follow these instructions at home:  · Only take over-the-counter or prescription medicines for pain, fever, or discomfort as directed by your health care provider.  · If your health care provider prescribed an antibiotic, take the medicine as directed. Make sure you finish it even if you start to feel better.  · Do not wear a tight or underwire bra. Wear a soft, supportive bra.  · Increase your fluid intake, especially if you have a fever.  · Women who are breastfeeding should follow these instructions:  ? Continue to empty the breast. Your health care provider can tell you whether this milk is safe for your infant or needs to be thrown out. You may be told to stop nursing until your health care provider thinks it is safe for your baby. Use a breast pump if you are advised to stop nursing.  ? Keep your nipples clean and dry.  ? Empty the first breast completely before going to the other breast. If your baby is not emptying your breasts completely for some reason, use a breast pump to empty your breasts.  ? If you go back to work, pump your breasts while at work to stay   in time with your nursing schedule.  ? Avoid allowing your breasts to become overly filled with milk (engorged).  Contact a health care provider if:  · You have pus-like discharge from the breast.  · Your symptoms do not improve with the treatment prescribed by your health care provider within 2 days.  Get help right away if:  · Your pain and swelling are getting worse.  · You have pain that is not controlled with medicine.  · You have a red line extending from the breast toward your armpit.  · You have a fever or persistent symptoms for more than 2-3 days.  · You have a fever  and your symptoms suddenly get worse.  This information is not intended to replace advice given to you by your health care provider. Make sure you discuss any questions you have with your health care provider.  Document Released: 08/13/2005 Document Revised: 01/19/2016 Document Reviewed: 03/13/2013  Elsevier Interactive Patient Education © 2017 [employer]

## 2016-12-03 NOTE — Assessment & Plan Note (Signed)
minimize simple carbs. Increase exercise as tolerated. Continue current meds  

## 2016-12-04 ENCOUNTER — Other Ambulatory Visit: Payer: Self-pay | Admitting: Family Medicine

## 2016-12-04 DIAGNOSIS — N649 Disorder of breast, unspecified: Secondary | ICD-10-CM

## 2016-12-07 ENCOUNTER — Ambulatory Visit
Admission: RE | Admit: 2016-12-07 | Discharge: 2016-12-07 | Disposition: A | Discharge status: Home / Self Care | Payer: Medicare Other | Source: Ambulatory Visit | Attending: Family Medicine | Admitting: Family Medicine

## 2016-12-07 DIAGNOSIS — N649 Disorder of breast, unspecified: Secondary | ICD-10-CM

## 2017-01-03 ENCOUNTER — Ambulatory Visit (INDEPENDENT_AMBULATORY_CARE_PROVIDER_SITE_OTHER): Payer: Medicare Other | Admitting: Family Medicine

## 2017-01-03 ENCOUNTER — Encounter: Payer: Self-pay | Admitting: Family Medicine

## 2017-01-03 VITALS — BP 106/51 | HR 64 | Temp 98.3°F | Wt 187.8 lb

## 2017-01-03 DIAGNOSIS — I10 Essential (primary) hypertension: Secondary | ICD-10-CM | POA: Diagnosis not present

## 2017-01-03 DIAGNOSIS — M8580 Other specified disorders of bone density and structure, unspecified site: Secondary | ICD-10-CM

## 2017-01-03 DIAGNOSIS — M791 Myalgia, unspecified site: Secondary | ICD-10-CM

## 2017-01-03 DIAGNOSIS — E6609 Other obesity due to excess calories: Secondary | ICD-10-CM

## 2017-01-03 DIAGNOSIS — M810 Age-related osteoporosis without current pathological fracture: Secondary | ICD-10-CM | POA: Insufficient documentation

## 2017-01-03 DIAGNOSIS — E782 Mixed hyperlipidemia: Secondary | ICD-10-CM

## 2017-01-03 DIAGNOSIS — R739 Hyperglycemia, unspecified: Secondary | ICD-10-CM

## 2017-01-03 HISTORY — DX: Other specified disorders of bone density and structure, unspecified site: M85.80

## 2017-01-03 LAB — CBC
HCT: 43.9 % (ref 36.0–46.0)
Hemoglobin: 14.7 g/dL (ref 12.0–15.0)
MCHC: 33.4 g/dL (ref 30.0–36.0)
MCV: 94.8 fl (ref 78.0–100.0)
Platelets: 246 10*3/uL (ref 150.0–400.0)
RBC: 4.63 Mil/uL (ref 3.87–5.11)
RDW: 13.8 % (ref 11.5–15.5)
WBC: 5.8 10*3/uL (ref 4.0–10.5)

## 2017-01-03 LAB — LIPID PANEL
Cholesterol: 164 mg/dL (ref 0–200)
HDL: 48.8 mg/dL (ref >39.00)
LDL Cholesterol: 96 mg/dL (ref 0–99)
NonHDL: 115.55
Total CHOL/HDL Ratio: 3
Triglycerides: 97 mg/dL (ref 0.0–149.0)
VLDL: 19.4 mg/dL (ref 0.0–40.0)

## 2017-01-03 LAB — COMPREHENSIVE METABOLIC PANEL
ALT: 14 U/L (ref 0–35)
AST: 22 U/L (ref 0–37)
Albumin: 4.2 g/dL (ref 3.5–5.2)
Alkaline Phosphatase: 74 U/L (ref 39–117)
BUN: 16 mg/dL (ref 6–23)
CO2: 28 mEq/L (ref 19–32)
Calcium: 10.2 mg/dL (ref 8.4–10.5)
Chloride: 105 mEq/L (ref 96–112)
Creatinine, Ser: 0.81 mg/dL (ref 0.40–1.20)
GFR: 73.26 mL/min (ref >60.00)
Glucose, Bld: 94 mg/dL (ref 70–99)
Potassium: 4.1 mEq/L (ref 3.5–5.1)
Sodium: 140 mEq/L (ref 135–145)
Total Bilirubin: 0.7 mg/dL (ref 0.2–1.2)
Total Protein: 6.8 g/dL (ref 6.0–8.3)

## 2017-01-03 LAB — SEDIMENTATION RATE: Sed Rate: 4 mm/hr (ref 0–30)

## 2017-01-03 LAB — TSH: TSH: 1.89 u[IU]/mL (ref 0.35–4.50)

## 2017-01-03 LAB — HEMOGLOBIN A1C: Hgb A1c MFr Bld: 6 % (ref 4.6–6.5)

## 2017-01-03 NOTE — Assessment & Plan Note (Signed)
Encouraged heart healthy diet, increase exercise, avoid trans fats, consider a krill oil cap daily 

## 2017-01-03 NOTE — Assessment & Plan Note (Signed)
Encouraged DASH diet, decrease po intake and increase exercise as tolerated. Needs 7-8 hours of sleep nightly. Avoid trans fats, eat small, frequent meals every 4-5 hours with lean proteins, complex carbs and healthy fats. Minimize simple carbs, consider bariatrics 

## 2017-01-03 NOTE — Assessment & Plan Note (Signed)
hgba1c acceptable, minimize simple carbs. Increase exercise as tolerated.  

## 2017-01-03 NOTE — Progress Notes (Signed)
Pre visit review using our clinic review tool, if applicable. No additional management support is needed unless otherwise documented below in the visit note. 

## 2017-01-03 NOTE — Assessment & Plan Note (Signed)
Well controlled, no changes to meds. Encouraged heart healthy diet such as the DASH diet and exercise as tolerated.  °

## 2017-01-03 NOTE — Patient Instructions (Signed)
L Tryptophan for sleep  Insomnia Insomnia is a sleep disorder that makes it difficult to fall asleep or to stay asleep. Insomnia can cause tiredness (fatigue), low energy, difficulty concentrating, mood swings, and poor performance at work or school. There are three different ways to classify insomnia:  Difficulty falling asleep.  Difficulty staying asleep.  Waking up too early in the morning. Any type of insomnia can be long-term (chronic) or short-term (acute). Both are common. Short-term insomnia usually lasts for three months or less. Chronic insomnia occurs at least three times a week for longer than three months. What are the causes? Insomnia may be caused by another condition, situation, or substance, such as:  Anxiety.  Certain medicines.  Gastroesophageal reflux disease (GERD) or other gastrointestinal conditions.  Asthma or other breathing conditions.  Restless legs syndrome, sleep apnea, or other sleep disorders.  Chronic pain.  Menopause. This may include hot flashes.  Stroke.  Abuse of alcohol, tobacco, or illegal drugs.  Depression.  Caffeine.  Neurological disorders, such as Alzheimer disease.  An overactive thyroid (hyperthyroidism). The cause of insomnia may not be known. What increases the risk? Risk factors for insomnia include:  Gender. Women are more commonly affected than men.  Age. Insomnia is more common as you get older.  Stress. This may involve your professional or personal life.  Income. Insomnia is more common in people with lower income.  Lack of exercise.  Irregular work schedule or night shifts.  Traveling between different time zones. What are the signs or symptoms? If you have insomnia, trouble falling asleep or trouble staying asleep is the main symptom. This may lead to other symptoms, such as:  Feeling fatigued.  Feeling nervous about going to sleep.  Not feeling rested in the morning.  Having trouble  concentrating.  Feeling irritable, anxious, or depressed. How is this treated? Treatment for insomnia depends on the cause. If your insomnia is caused by an underlying condition, treatment will focus on addressing the condition. Treatment may also include:  Medicines to help you sleep.  Counseling or therapy.  Lifestyle adjustments. Follow these instructions at home:  Take medicines only as directed by your health care provider.  Keep regular sleeping and waking hours. Avoid naps.  Keep a sleep diary to help you and your health care provider figure out what could be causing your insomnia. Include:  When you sleep.  When you wake up during the night.  How well you sleep.  How rested you feel the next day.  Any side effects of medicines you are taking.  What you eat and drink.  Make your bedroom a comfortable place where it is easy to fall asleep:  Put up shades or special blackout curtains to block light from outside.  Use a white noise machine to block noise.  Keep the temperature cool.  Exercise regularly as directed by your health care provider. Avoid exercising right before bedtime.  Use relaxation techniques to manage stress. Ask your health care provider to suggest some techniques that may work well for you. These may include:  Breathing exercises.  Routines to release muscle tension.  Visualizing peaceful scenes.  Cut back on alcohol, caffeinated beverages, and cigarettes, especially close to bedtime. These can disrupt your sleep.  Do not overeat or eat spicy foods right before bedtime. This can lead to digestive discomfort that can make it hard for you to sleep.  Limit screen use before bedtime. This includes:  Watching TV.  Using your smartphone, tablet, and  computer.  Stick to a routine. This can help you fall asleep faster. Try to do a quiet activity, brush your teeth, and go to bed at the same time each night.  Get out of bed if you are still  awake after 15 minutes of trying to sleep. Keep the lights down, but try reading or doing a quiet activity. When you feel sleepy, go back to bed.  Make sure that you drive carefully. Avoid driving if you feel very sleepy.  Keep all follow-up appointments as directed by your health care provider. This is important. Contact a health care provider if:  You are tired throughout the day or have trouble in your daily routine due to sleepiness.  You continue to have sleep problems or your sleep problems get worse. Get help right away if:  You have serious thoughts about hurting yourself or someone else. This information is not intended to replace advice given to you by your health care provider. Make sure you discuss any questions you have with your health care provider. Document Released: 08/10/2000 Document Revised: 01/13/2016 Document Reviewed: 05/14/2014 Elsevier Interactive Patient Education  2017 [employer].

## 2017-01-03 NOTE — Progress Notes (Signed)
Patient ID: LATINA FRANK, female   DOB: [birth year], 76 y.o.   MRN: [id number]   Subjective:  I acted as a Education administrator for Penni Homans, [place], [place]   Patient ID: [name], female    DOB: 04-16-1941, 76 y.o.   MRN: [id number]  Chief Complaint  Patient presents with  . Follow-up    F/U from [facility].     HPI  Patient is in today for a 45-monthfollow up. Patient states that on Sunday night she started having headaches, leg, arms and back pain. States that she felt bad all day Monday; took 3 aspirin to help with the headache. She also tried resting. States that she has been feeling better since Tuesday. Patient has a Hx of HTN, asthma, hyperglycemia, mixed hyperlipidemia. Patient has no additional acute concerns noted at this time. She feels well today and denies any recent hospitalization. Denies CP/palp/SOB/HA/congestion/fevers/GI or GU c/o. Taking meds as prescribed  Patient Care Team: BMosie Lukes MD as PCP - General (Family Medicine) WRolm Bookbinder MD as Consulting Physician (General Surgery) HMonna Fam MD as Consulting Physician (Ophthalmology) DGarry Heater DDS as Consulting Physician (Dentistry)   Past Medical History:  Diagnosis Date  . Asthma    a little?  . Cervical cancer screening 01/31/2012  . Chicken pox as a child  . Depression with anxiety 06/07/2009   Qualifier: Diagnosis of  By: MMadilyn FiremanMD, CBarnetta Chapel   . Dermatitis of external ear 07/11/2012  . Dysphagia 01/31/2012  . Hiatal hernia with gastroesophageal reflux 10/26/2010   Qualifier: Diagnosis of  By: MMadilyn FiremanMD, CBarnetta Chapel   . Hyperglycemia 06/21/2013  . Hyperlipidemia, mixed 10/16/2015  . Hypertension   . Left hip pain 07/10/2015  . Lesion of breast 12/03/2016  . Low back pain 05/29/2015  . Measles as a child  . Mumps as a child  . Neck pain on left side 06/21/2013  . Obesity 10/07/2008   Qualifier: Diagnosis of  By: MMadilyn FiremanMD, CBarnetta Chapel   . Osteopenia 01/03/2017  . Overweight (BMI  25.0-29.9) 10/07/2008   Qualifier: Diagnosis of  By: MMadilyn FiremanMD, CBarnetta Chapel   . Preventative health care 01/31/2012  . Routine health maintenance 01/31/2012  . RUQ pain 05/29/2015  . Sinusitis, acute 07/10/2012  . Umbilical hernia   . Urinary frequency 09/28/2011  . Vertigo     Past Surgical History:  Procedure Laterality Date  . BREAST BIOPSY Right 1980  . BREAST EXCISIONAL BIOPSY Left 1970  . BREAST EXCISIONAL BIOPSY Right   . CYSTECTOMY     abdomen  . TUBAL LIGATION  1970    Family History  Problem Relation Age of Onset  . Cancer Mother 782      lung- smoker, ovarian  . Alcohol abuse Father   . Cancer Sister        ovarian  . Diabetes Sister   . Hypertension Sister   . Other Maternal Grandmother        pacemaker  . Multiple sclerosis Maternal Grandfather        ?  .Marland KitchenAsthma Paternal Uncle     Social History   Social History  . Marital status: Widowed    Spouse name: N/A  . Number of children: N/A  . Years of education: N/A   Occupational History  . Not on file.   Social History Main Topics  . Smoking status: Former Smoker    Packs/day: 1.00    Years: 30.00    Types:  Cigarettes    Quit date: 08/27/1990  . Smokeless tobacco: Never Used  . Alcohol use No  . Drug use: No  . Sexual activity: No     Comment: lives alone, no dietary restrictions   Other Topics Concern  . Not on file   Social History Narrative  . No narrative on file    Outpatient Medications Prior to Visit  Medication Sig Dispense Refill  . fish oil-omega-3 fatty acids 1000 MG capsule Take 1 g by mouth daily.      [name] losartan (COZAAR) 50 MG tablet Take 1 tablet (50 mg total) by mouth daily. 90 tablet 3  . magnesium gluconate (MAGONATE) 500 MG tablet Take 500 mg by mouth daily.    . Probiotic Product (PROBIOTIC-10 PO) Take by mouth.    . Triamcinolone Acetonide (TRIAMCINOLONE 0.1 % CREAM : EUCERIN) CREA Apply 1 application topically 2 (two) times daily as needed. 1 each 5  . triamcinolone  cream (KENALOG) 0.1 % Apply topically 2 (two) times daily. (Patient taking differently: Apply topically as needed. ) 80 g 0   No facility-administered medications prior to visit.     Allergies  Allergen Reactions  . Hydrochlorothiazide Cough  . Lisinopril Cough    Review of Systems  Constitutional: Negative for malaise/fatigue.  HENT: Negative for congestion.   Eyes: Negative for blurred vision.  Respiratory: Negative for cough and shortness of breath.   Cardiovascular: Negative for palpitations and leg swelling.  Gastrointestinal: Negative for vomiting.  Musculoskeletal: Positive for back pain and joint pain.  Skin: Negative for rash.  Neurological: Negative for loss of consciousness.       Objective:    Physical Exam  Constitutional: She is oriented to person, place, and time. She appears well-developed and well-nourished. No distress.  HENT:  Head: Normocephalic and atraumatic.  Eyes: Conjunctivae are normal.  Neck: Normal range of motion. No thyromegaly present.  Cardiovascular: Normal rate and regular rhythm.   Pulmonary/Chest: Effort normal and breath sounds normal. She has no wheezes.  Abdominal: Soft. Bowel sounds are normal. There is no tenderness.  Musculoskeletal: She exhibits no edema or deformity.  Neurological: She is alert and oriented to person, place, and time.  Skin: Skin is warm and dry. She is not diaphoretic.  Psychiatric: She has a normal mood and affect.    BP (!) 106/51 (BP Location: Left Arm, Patient Position: Sitting, Cuff Size: Normal)   Pulse 64   Temp 98.3 F (36.8 C) (Oral)   Wt 187 lb 12.8 oz (85.2 kg)   SpO2 99% Comment: RA  BMI 31.25 kg/m  Wt Readings from Last 3 Encounters:  01/03/17 187 lb 12.8 oz (85.2 kg)  12/03/16 187 lb 9.6 oz (85.1 kg)  11/21/16 193 lb 6.4 oz (87.7 kg)   BP Readings from Last 3 Encounters:  01/03/17 (!) 106/51  12/03/16 135/71  11/21/16 (!) 144/74     Immunization History  Administered Date(s)  Administered  . Influenza Whole 06/07/2009, 05/28/2011, 05/28/2012  . Influenza, High Dose Seasonal PF 05/20/2015  . Influenza,inj,Quad PF,36+ Mos 05/26/2013, 05/31/2014  . Pneumococcal Conjugate-13 05/31/2014  . Pneumococcal Polysaccharide-23 06/07/2009  . Td 01/31/2003  . Tdap 09/28/2011    Health Maintenance  Topic Date Due  . INFLUENZA VACCINE  03/27/2017  . Fecal DNA (Cologuard)  02/14/2018  . TETANUS/TDAP  09/27/2021  . DEXA SCAN  Completed  . PNA vac Low Risk Adult  Completed    Lab Results  Component Value Date   WBC  5.8 01/03/2017   HGB 14.7 01/03/2017   HCT 43.9 01/03/2017   PLT 246.0 01/03/2017   GLUCOSE 94 01/03/2017   CHOL 164 01/03/2017   TRIG 97.0 01/03/2017   HDL 48.80 01/03/2017   LDLCALC 96 01/03/2017   ALT 14 01/03/2017   AST 22 01/03/2017   NA 140 01/03/2017   K 4.1 01/03/2017   CL 105 01/03/2017   CREATININE 0.81 01/03/2017   BUN 16 01/03/2017   CO2 28 01/03/2017   TSH 1.89 01/03/2017   HGBA1C 6.0 01/03/2017    Lab Results  Component Value Date   TSH 1.89 01/03/2017   Lab Results  Component Value Date   WBC 5.8 01/03/2017   HGB 14.7 01/03/2017   HCT 43.9 01/03/2017   MCV 94.8 01/03/2017   PLT 246.0 01/03/2017   Lab Results  Component Value Date   NA 140 01/03/2017   K 4.1 01/03/2017   CO2 28 01/03/2017   GLUCOSE 94 01/03/2017   BUN 16 01/03/2017   CREATININE 0.81 01/03/2017   BILITOT 0.7 01/03/2017   ALKPHOS 74 01/03/2017   AST 22 01/03/2017   ALT 14 01/03/2017   PROT 6.8 01/03/2017   ALBUMIN 4.2 01/03/2017   CALCIUM 10.2 01/03/2017   GFR 73.26 01/03/2017   Lab Results  Component Value Date   CHOL 164 01/03/2017   Lab Results  Component Value Date   HDL 48.80 01/03/2017   Lab Results  Component Value Date   LDLCALC 96 01/03/2017   Lab Results  Component Value Date   TRIG 97.0 01/03/2017   Lab Results  Component Value Date   CHOLHDL 3 01/03/2017   Lab Results  Component Value Date   HGBA1C 6.0 01/03/2017          Assessment & Plan:   Problem List Items Addressed This Visit    Obesity    Encouraged DASH diet, decrease po intake and increase exercise as tolerated. Needs 7-8 hours of sleep nightly. Avoid trans fats, eat small, frequent meals every 4-5 hours with lean proteins, complex carbs and healthy fats. Minimize simple carbs, consider bariatrics      Essential hypertension, benign    Well controlled, no changes to meds. Encouraged heart healthy diet such as the DASH diet and exercise as tolerated.       Relevant Orders   CBC (Completed)   Comprehensive metabolic panel (Completed)   TSH (Completed)   Hyperglycemia    hgba1c acceptable, minimize simple carbs. Increase exercise as tolerated.       Relevant Orders   Hemoglobin A1c (Completed)   Hyperlipidemia, mixed    Encouraged heart healthy diet, increase exercise, avoid trans fats, consider a krill oil cap daily      Relevant Orders   Lipid panel (Completed)   Osteopenia   Myalgia - Primary    Over past weekend had significant myalgias, nausea, fatigue but she feels better now. Encouraged ongoing rest and hydration      Relevant Orders   Sed Rate (ESR) (Completed)      I am having Ms. Tomasita Crumble maintain her fish oil-omega-3 fatty acids, magnesium gluconate, Probiotic Product (PROBIOTIC-10 PO), losartan, triamcinolone 0.1 % cream : eucerin, and triamcinolone cream.  No orders of the defined types were placed in this encounter.   CMA served as Education administrator during this visit. History, Physical and Plan performed by medical provider. Documentation and orders reviewed and attested to.  Penni Homans, MD

## 2017-01-06 DIAGNOSIS — M791 Myalgia, unspecified site: Secondary | ICD-10-CM | POA: Insufficient documentation

## 2017-01-06 NOTE — Assessment & Plan Note (Signed)
Over past weekend had significant myalgias, nausea, fatigue but she feels better now. Encouraged ongoing rest and hydration

## 2017-01-10 ENCOUNTER — Encounter (INDEPENDENT_AMBULATORY_CARE_PROVIDER_SITE_OTHER): Payer: Self-pay | Admitting: Family Medicine

## 2017-02-12 ENCOUNTER — Encounter: Payer: Self-pay | Admitting: Family Medicine

## 2017-02-12 NOTE — Progress Notes (Signed)
[facility] Interpretation: OCT Glaucoma Eye:OU Cooperation:Good Reliability: Good 6/14/

## 2017-02-14 ENCOUNTER — Encounter (INDEPENDENT_AMBULATORY_CARE_PROVIDER_SITE_OTHER): Payer: Self-pay | Admitting: Family Medicine

## 2017-03-04 ENCOUNTER — Encounter (INDEPENDENT_AMBULATORY_CARE_PROVIDER_SITE_OTHER): Payer: Self-pay | Admitting: Family Medicine

## 2017-03-04 ENCOUNTER — Ambulatory Visit (INDEPENDENT_AMBULATORY_CARE_PROVIDER_SITE_OTHER): Payer: Medicare Other | Admitting: Family Medicine

## 2017-03-04 VITALS — BP 157/73 | HR 63 | Temp 98.2°F | Ht 65.0 in | Wt 184.0 lb

## 2017-03-04 DIAGNOSIS — R5383 Other fatigue: Secondary | ICD-10-CM

## 2017-03-04 DIAGNOSIS — Z1389 Encounter for screening for other disorder: Secondary | ICD-10-CM

## 2017-03-04 DIAGNOSIS — I10 Essential (primary) hypertension: Secondary | ICD-10-CM | POA: Diagnosis not present

## 2017-03-04 DIAGNOSIS — E669 Obesity, unspecified: Secondary | ICD-10-CM | POA: Diagnosis not present

## 2017-03-04 DIAGNOSIS — R7303 Prediabetes: Secondary | ICD-10-CM | POA: Diagnosis not present

## 2017-03-04 DIAGNOSIS — R0602 Shortness of breath: Secondary | ICD-10-CM

## 2017-03-04 DIAGNOSIS — Z6830 Body mass index (BMI) 30.0-30.9, adult: Secondary | ICD-10-CM

## 2017-03-04 DIAGNOSIS — E559 Vitamin D deficiency, unspecified: Secondary | ICD-10-CM

## 2017-03-04 DIAGNOSIS — Z0289 Encounter for other administrative examinations: Secondary | ICD-10-CM

## 2017-03-04 DIAGNOSIS — Z1331 Encounter for screening for depression: Secondary | ICD-10-CM

## 2017-03-04 NOTE — Progress Notes (Signed)
Office: [phone]  /  Fax: [phone]   HPI:   Chief Complaint: OBESITY  Christina [name] (MR# [id number]) is a 76 y.o. female who presents on 03/04/2017 for obesity evaluation and treatment. Current BMI is Body mass index is 30.62 kg/m.Christina [name] has struggled with obesity for years and has been unsuccessful in either losing weight or maintaining long term weight loss. [name] attended our information session and states she is currently in the action stage of change and ready to dedicate time achieving and maintaining a healthier weight.  Christina [name] states her family eats meals together she thinks her family will eat healthier with  her her desired weight loss is 28 or 38 lbs she started gaining weight in 2007 her heaviest weight ever was 187 lbs. she is a picky eater and doesn't like to eat healthier foods  she has significant food cravings issues  she snacks frequently in the evenings she is frequently drinking liquids with calories she has binge eating behaviors she struggles with emotional eating    Fatigue Christina [name] feels her energy is lower than it should be. This has worsened with weight gain and has not worsened recently. Christina [name] admits to daytime somnolence and  admits to waking up still tired. Patient is at risk for obstructive sleep apnea. Patent has a history of symptoms of daytime fatigue, morning fatigue, morning headache and hypertension. Patient generally gets 5 hours of sleep per night, and states they generally have restless. Snoring is present. Apneic episodes are not present. Epworth Sleepiness Score is 2  Dyspnea on exertion Christina [name] notes increasing shortness of breath with exercising and seems to be worsening over time with weight gain. She notes getting out of breath sooner with activity than she used to. This has not gotten worse recently. Christina [name] denies orthopnea.  Hypertension [name] is a 76 y.o. female with hypertension. Her blood pressure  is elevated today and is on losartan but she didn't take medications this morning. [name] denies chest pain or headache. She is working weight loss to help control her blood pressure with the goal of decreasing her risk of heart attack and stroke. Patricias blood pressure is not currently controlled.  Pre-Diabetes Christina [name] has a diagnosis of pre-diabetes based on her last Hgb A1c elevated at 6.0 and was informed this puts her at greater risk of developing diabetes. She is not taking metformin currently and continues to work on diet and exercise to decrease risk of diabetes. She admits polyphagia and denies nausea or hypoglycemia.  Vitamin D deficiency Christina [name] has a diagnosis of vitamin D deficiency, no recent labs. She is not currently taking vit D and denies nausea, vomiting or muscle weakness.   Depression Screen Christina [name]'s Food and Mood (modified PHQ-9) score was  Depression screen PHQ 2/9 03/04/2017  Decreased Interest 1  Down, Depressed, Hopeless 3  PHQ - 2 Score 4  Altered sleeping 2  Tired, decreased energy 2  Change in appetite 2  Feeling bad or failure about yourself  0  Trouble concentrating 1  Moving slowly or fidgety/restless 0  Suicidal thoughts 0  PHQ-9 Score 11    ALLERGIES: Allergies  Allergen Reactions   Hydrochlorothiazide Cough   Lisinopril Cough    MEDICATIONS: Current Outpatient Prescriptions on File Prior to Visit  Medication Sig Dispense Refill   fish oil-omega-3 fatty acids 1000 MG capsule Take 1 g by mouth daily.       losartan (COZAAR) 50 MG tablet Take 1 tablet (50 mg total)  by mouth daily. 90 tablet 3   magnesium gluconate (MAGONATE) 500 MG tablet Take 500 mg by mouth daily.     Probiotic Product (PROBIOTIC-10 PO) Take by mouth.     Triamcinolone Acetonide (TRIAMCINOLONE 0.1 % CREAM : EUCERIN) CREA Apply 1 application topically 2 (two) times daily as needed. 1 each 5   triamcinolone cream (KENALOG) 0.1 % Apply topically 2 (two)  times daily. (Patient taking differently: Apply topically as needed. ) 80 g 0   No current facility-administered medications on file prior to visit.     PAST MEDICAL HISTORY: Past Medical History:  Diagnosis Date   Acid reflux    Anxiety    Asthma    a little?   Back pain    Cervical cancer screening 01/31/2012   Chicken pox as a child   Depression with anxiety 06/07/2009   Qualifier: Diagnosis of  By: [name] MD, Catherine     Dermatitis of external ear 07/11/2012   Dysphagia 01/31/2012   Heartburn    Hiatal hernia with gastroesophageal reflux 10/26/2010   Qualifier: Diagnosis of  By: [name] MD, Catherine     Hyperglycemia 06/21/2013   Hyperlipidemia, mixed 10/16/2015   Hypertension    Joint pain    Left hip pain 07/10/2015   Lesion of breast 12/03/2016   Low back pain 05/29/2015   Measles as a child   Mumps as a child   Neck pain on left side 06/21/2013   Obesity 10/07/2008   Qualifier: Diagnosis of  By: [name] MD, Otilio Jefferson 01/03/2017   Overweight (BMI 25.0-29.9) 10/07/2008   Qualifier: Diagnosis of  By: [name] MD, Catherine     Palpitations    Prediabetes    Preventative health care 01/31/2012   Routine health maintenance 01/31/2012   RUQ pain 05/29/2015   Sinusitis, acute 07/10/2012   SOB (shortness of breath)    Spinal stenosis    Umbilical hernia    Urinary frequency 09/28/2011   Vertigo     PAST SURGICAL HISTORY: Past Surgical History:  Procedure Laterality Date   BREAST BIOPSY Right 1980   BREAST EXCISIONAL BIOPSY Left 1970   BREAST EXCISIONAL BIOPSY Right    CYSTECTOMY     abdomen   TUBAL LIGATION  1970    SOCIAL HISTORY: Social History  Substance Use Topics   Smoking status: Former Smoker    Packs/day: 1.00    Years: 30.00    Types: Cigarettes    Quit date: 08/27/1990   Smokeless tobacco: Never Used   Alcohol use No    FAMILY HISTORY: Family History  Problem Relation Age of Onset    Cancer Mother 41       lung- smoker, ovarian   Hypertension Mother    Anxiety disorder Mother    Alcohol abuse Father    Cancer Sister        ovarian   Diabetes Sister    Hypertension Sister    Other Maternal Grandmother        pacemaker   Multiple sclerosis Maternal Grandfather        ?   Asthma Paternal Uncle     ROS: Review of Systems  Constitutional: Positive for malaise/fatigue.       Breast Lumps  HENT:       Dentures  Eyes:       Floaters  Respiratory: Positive for shortness of breath (with activity).   Cardiovascular: Positive for palpitations.       Calf/Leg  Pain with Walking Leg Cramping  Gastrointestinal: Positive for heartburn. Negative for nausea and vomiting.  Genitourinary: Positive for frequency.  Musculoskeletal: Positive for back pain and neck pain.       Negative muscle weakness  Neurological: Positive for dizziness (vertigo) and headaches.  Endo/Heme/Allergies:       Polyphagia Negative hypoglycemia  Psychiatric/Behavioral: The patient has insomnia.        Stress     PHYSICAL EXAM: Blood pressure (!) 157/73, pulse 63, temperature 98.2 F (36.8 C), temperature source Oral, height 5\' 5"  (1.651 m), weight 184 lb (83.5 kg), SpO2 97 %. Body mass index is 30.62 kg/m. Physical Exam  Constitutional: She is oriented to person, place, and time. She appears well-developed and well-nourished.  Cardiovascular:  Murmur (grade 1/6 systolic murmur) heard. Pulmonary/Chest: Effort normal.  Musculoskeletal: Normal range of motion.  Neurological: She is oriented to person, place, and time.  Skin: Skin is warm and dry.  Psychiatric: She has a normal mood and affect. Her behavior is normal.  Vitals reviewed.   RECENT LABS AND TESTS: BMET    Component Value Date/Time   NA 140 01/03/2017 1137   K 4.1 01/03/2017 1137   CL 105 01/03/2017 1137   CO2 28 01/03/2017 1137   GLUCOSE 94 01/03/2017 1137   BUN 16 01/03/2017 1137   CREATININE 0.81  01/03/2017 1137   CREATININE 0.78 05/20/2015 1704   CALCIUM 10.2 01/03/2017 1137   Lab Results  Component Value Date   HGBA1C 6.0 01/03/2017   No results found for: INSULIN CBC    Component Value Date/Time   WBC 5.8 01/03/2017 1137   RBC 4.63 01/03/2017 1137   HGB 14.7 01/03/2017 1137   HCT 43.9 01/03/2017 1137   PLT 246.0 01/03/2017 1137   MCV 94.8 01/03/2017 1137   MCH 31.9 05/20/2015 1704   MCHC 33.4 01/03/2017 1137   RDW 13.8 01/03/2017 1137   Iron/TIBC/Ferritin/ %Sat No results found for: IRON, TIBC, FERRITIN, IRONPCTSAT Lipid Panel     Component Value Date/Time   CHOL 164 01/03/2017 1137   TRIG 97.0 01/03/2017 1137   HDL 48.80 01/03/2017 1137   CHOLHDL 3 01/03/2017 1137   VLDL 19.4 01/03/2017 1137   LDLCALC 96 01/03/2017 1137   Hepatic Function Panel     Component Value Date/Time   PROT 6.8 01/03/2017 1137   ALBUMIN 4.2 01/03/2017 1137   AST 22 01/03/2017 1137   ALT 14 01/03/2017 1137   ALKPHOS 74 01/03/2017 1137   BILITOT 0.7 01/03/2017 1137   BILIDIR 0.1 05/31/2014 1612   IBILI 0.6 05/26/2013 0921      Component Value Date/Time   TSH 1.89 01/03/2017 1137   TSH 1.51 10/05/2016 1100   TSH 1.92 04/03/2016 0845    ECG  shows NSR with a rate of 62 BPM INDIRECT CALORIMETER done today shows a VO2 of 238 and a REE of 1654.    ASSESSMENT AND PLAN: Other fatigue - Plan: EKG 12-Lead  Shortness of breath on exertion  Essential hypertension  Prediabetes - Plan: Comprehensive metabolic panel, Hemoglobin A1c, Insulin, random  Vitamin D deficiency - Plan: VITAMIN D 25 Hydroxy (Vit-D Deficiency, Fractures)  Depression screening  Class 1 obesity with serious comorbidity and body mass index (BMI) of 30.0 to 30.9 in adult, unspecified obesity type  PLAN:  Fatigue Dodi was informed that her fatigue may be related to obesity, depression or many other causes. Labs will be ordered, and in the meanwhile Kortni has agreed to work on  diet, exercise and  weight loss to help with fatigue. Proper sleep hygiene was discussed including the need for 7-8 hours of quality sleep each night. A sleep study was not ordered based on symptoms and Epworth score.  Dyspnea on exertion Harbour's shortness of breath appears to be obesity related and exercise induced. She has agreed to work on weight loss and gradually increase exercise to treat her exercise induced shortness of breath. If Brittyn follows our instructions and loses weight without improvement of her shortness of breath, we will plan to refer to pulmonology. We will monitor this condition regularly. Alyssha agrees to this plan.  Hypertension We discussed sodium restriction, working on healthy weight loss, and a regular exercise program as the means to achieve improved blood pressure control. Kingslee agreed with this plan and agreed to follow up as directed. We will continue to monitor her blood pressure as well as her progress with the above lifestyle modifications. We will check labs and re-check blood pressure in 2 weeks at next visit. She will continue her medications as prescribed and will watch for signs of hypotension as she continues her lifestyle modifications.  Pre-Diabetes Page will continue to work on weight loss, exercise, and decreasing simple carbohydrates in her diet to help decrease the risk of diabetes.  She was informed that eating too many simple carbohydrates or too many calories at one sitting increases the likelihood of GI side effects. We will check labs and Lakechia agreed to follow up with [place] as directed to monitor her progress.  Vitamin D Deficiency Carleigh was informed that low vitamin D levels contributes to fatigue and are associated with obesity, breast, and colon cancer. We will check labs and will follow up for routine testing of vitamin D, at least 2-3 times per year. Kainat agrees to follow up with our clinic in 2 weeks.  Depression Screen Imaya had a  moderately positive depression screening. Depression is commonly associated with obesity and often results in emotional eating behaviors. We will monitor this closely and work on CBT to help improve the non-hunger eating patterns. Referral to Psychology may be required if no improvement is seen as she continues in our clinic.  Obesity Liyana is currently in the action stage of change and her goal is to continue with weight loss efforts She has agreed to follow the Category 1 plan +100 calories Satomi has been instructed to work up to a goal of 150 minutes of combined cardio and strengthening exercise per week for weight loss and overall health benefits. We discussed the following Behavioral Modification Strategies today: increasing lean protein intake and better snacking choices  Sumaiyah has agreed to follow up with our clinic in 2 weeks. She was informed of the importance of frequent follow up visits to maximize her success with intensive lifestyle modifications for her multiple health conditions. She was informed we would discuss her lab results at her next visit unless there is a critical issue that needs to be addressed sooner. Roxsana agreed to keep her next visit at the agreed upon time to discuss these results.  I, Doreene Nest, am acting as transcriptionist for Dennard Nip, MD  I have reviewed the above documentation for accuracy and completeness, and I agree with the above. -Dennard Nip, MD  OBESITY BEHAVIORAL INTERVENTION VISIT  Today's visit was # 1 out of 56.  Starting weight: 184 lbs Starting date: 03/04/17 Today's weight : 184 lbs Today's date: 03/04/2017 Total lbs lost to date: 0 (Patients must lose  7 lbs in the first 6 months to continue with counseling)   ASK: We discussed the diagnosis of obesity with [name] today and Skarleth agreed to give [place] permission to discuss obesity behavioral modification therapy today.  ASSESS: Kailynne has the diagnosis  of obesity and her BMI today is 30.7 Helen is in the action stage of change   ADVISE: Sugey was educated on the multiple health risks of obesity as well as the benefit of weight loss to improve her health. She was advised of the need for long term treatment and the importance of lifestyle modifications.  AGREE: Multiple dietary modification options and treatment options were discussed and  Keyshla agreed to follow the Category 1 plan +100 calories We discussed the following Behavioral Modification Strategies today: increasing lean protein intake and better snacking choices

## 2017-03-05 LAB — COMPREHENSIVE METABOLIC PANEL
ALT: 15 IU/L (ref 0–32)
AST: 21 IU/L (ref 0–40)
Albumin/Globulin Ratio: 2 (ref 1.2–2.2)
Albumin: 4.3 g/dL (ref 3.5–4.8)
Alkaline Phosphatase: 77 IU/L (ref 39–117)
BUN/Creatinine Ratio: 17 (ref 12–28)
BUN: 13 mg/dL (ref 8–27)
Bilirubin Total: 0.9 mg/dL (ref 0.0–1.2)
CO2: 24 mmol/L (ref 20–29)
Calcium: 10.1 mg/dL (ref 8.7–10.3)
Chloride: 104 mmol/L (ref 96–106)
Creatinine, Ser: 0.75 mg/dL (ref 0.57–1.00)
GFR calc Af Amer: 90 mL/min/{1.73_m2} (ref >59)
GFR calc non Af Amer: 78 mL/min/{1.73_m2} (ref >59)
Globulin, Total: 2.2 g/dL (ref 1.5–4.5)
Glucose: 99 mg/dL (ref 65–99)
Potassium: 4.3 mmol/L (ref 3.5–5.2)
Sodium: 142 mmol/L (ref 134–144)
Total Protein: 6.5 g/dL (ref 6.0–8.5)

## 2017-03-05 LAB — HEMOGLOBIN A1C
Est. average glucose Bld gHb Est-mCnc: 117 mg/dL
Hgb A1c MFr Bld: 5.7 % — ABNORMAL HIGH (ref 4.8–5.6)

## 2017-03-05 LAB — INSULIN, RANDOM: INSULIN: 11.1 u[IU]/mL (ref 2.6–24.9)

## 2017-03-05 LAB — VITAMIN D 25 HYDROXY (VIT D DEFICIENCY, FRACTURES): Vit D, 25-Hydroxy: 34.3 ng/mL (ref 30.0–100.0)

## 2017-03-18 ENCOUNTER — Ambulatory Visit (INDEPENDENT_AMBULATORY_CARE_PROVIDER_SITE_OTHER): Payer: Medicare Other | Admitting: Family Medicine

## 2017-03-18 VITALS — BP 147/71 | HR 62 | Temp 97.9°F | Ht 65.0 in | Wt 178.0 lb

## 2017-03-18 DIAGNOSIS — E669 Obesity, unspecified: Secondary | ICD-10-CM

## 2017-03-18 DIAGNOSIS — R7303 Prediabetes: Secondary | ICD-10-CM

## 2017-03-18 DIAGNOSIS — Z6830 Body mass index (BMI) 30.0-30.9, adult: Secondary | ICD-10-CM | POA: Diagnosis not present

## 2017-03-18 DIAGNOSIS — E559 Vitamin D deficiency, unspecified: Secondary | ICD-10-CM

## 2017-03-18 MED ORDER — VITAMIN D (ERGOCALCIFEROL) 1.25 MG (50000 UNIT) PO CAPS: 50000.0000 [IU] | ORAL_CAPSULE | ORAL | 0 refills | Status: DC

## 2017-03-19 NOTE — Progress Notes (Signed)
Office: [phone]  /  Fax: [phone]   HPI:   Chief Complaint: OBESITY Christina [name] is here to discuss her progress with her obesity treatment plan. She is on the  follow the Category 1 plan and is following her eating plan approximately 100 % of the time. She states she is exercising 0 minutes 0 times per week. Christina [name] has done well on the category 1 plan but she is eating mostly chicken and is getting bored. Her hunger is controlled but she is having some cravings, especially for chocolate. Her weight is 178 lb (80.7 kg) today and has had a weight loss of 6 pounds over a period of 2 weeks since her last visit. She has lost 6 lbs since starting treatment with [place].  Vitamin D deficiency Christina [name] has a new diagnosis of vitamin D deficiency. She is not currently taking vit D and is not yet at ideal range >50. She notes fatigue and denies nausea, vomiting or muscle weakness.  Pre-Diabetes Christina [name] has a diagnosis of pre-diabetes, Hgb A1c has improved to 5.7 with diet and exercise. Christina [name] did well with low simple carbohydrate diet. She was informed pre-diabetes puts her at a greater risk of developing diabetes. She is not taking metformin currently and continues to work on diet and exercise to decrease risk of diabetes. She denies nausea or hypoglycemia.   ALLERGIES: Allergies  Allergen Reactions  . Hydrochlorothiazide Cough  . Lisinopril Cough    MEDICATIONS: Current Outpatient Prescriptions on File Prior to Visit  Medication Sig Dispense Refill  . fish oil-omega-3 fatty acids 1000 MG capsule Take 1 g by mouth daily.      [name] losartan (COZAAR) 50 MG tablet Take 1 tablet (50 mg total) by mouth daily. 90 tablet 3  . magnesium gluconate (MAGONATE) 500 MG tablet Take 500 mg by mouth daily.    . Probiotic Product (PROBIOTIC-10 PO) Take by mouth.    . Triamcinolone Acetonide (TRIAMCINOLONE 0.1 % CREAM : EUCERIN) CREA Apply 1 application topically 2 (two) times daily as needed. 1 each 5    . triamcinolone cream (KENALOG) 0.1 % Apply topically 2 (two) times daily. (Patient taking differently: Apply topically as needed. ) 80 g 0   No current facility-administered medications on file prior to visit.     PAST MEDICAL HISTORY: Past Medical History:  Diagnosis Date  . Acid reflux   . Anxiety   . Asthma    a little?  . Back pain   . Cervical cancer screening 01/31/2012  . Chicken pox as a child  . Depression with anxiety 06/07/2009   Qualifier: Diagnosis of  By: [name] MD, [name]    . Dermatitis of external ear 07/11/2012  . Dysphagia 01/31/2012  . Heartburn   . Hiatal hernia with gastroesophageal reflux 10/26/2010   Qualifier: Diagnosis of  By: [name] MD, [name]    . Hyperglycemia 06/21/2013  . Hyperlipidemia, mixed 10/16/2015  . Hypertension   . Joint pain   . Left hip pain 07/10/2015  . Lesion of breast 12/03/2016  . Low back pain 05/29/2015  . Measles as a child  . Mumps as a child  . Neck pain on left side 06/21/2013  . Obesity 10/07/2008   Qualifier: Diagnosis of  By: [name] MD, [name]    . Osteopenia 01/03/2017  . Overweight (BMI 25.0-29.9) 10/07/2008   Qualifier: Diagnosis of  By: [name] MD, [name]    . Palpitations   . Prediabetes   . Preventative health care 01/31/2012  .  Routine health maintenance 01/31/2012  . RUQ pain 05/29/2015  . Sinusitis, acute 07/10/2012  . SOB (shortness of breath)   . Spinal stenosis   . Umbilical hernia   . Urinary frequency 09/28/2011  . Vertigo     PAST SURGICAL HISTORY: Past Surgical History:  Procedure Laterality Date  . BREAST BIOPSY Right 1980  . BREAST EXCISIONAL BIOPSY Left 1970  . BREAST EXCISIONAL BIOPSY Right   . CYSTECTOMY     abdomen  . TUBAL LIGATION  1970    SOCIAL HISTORY: Social History  Substance Use Topics  . Smoking status: Former Smoker    Packs/day: 1.00    Years: 30.00    Types: Cigarettes    Quit date: 08/27/1990  . Smokeless tobacco: Never Used  . Alcohol use No    FAMILY  HISTORY: Family History  Problem Relation Age of Onset  . Cancer Mother 38       lung- smoker, ovarian  . Hypertension Mother   . Anxiety disorder Mother   . Alcohol abuse Father   . Cancer Sister        ovarian  . Diabetes Sister   . Hypertension Sister   . Other Maternal Grandmother        pacemaker  . Multiple sclerosis Maternal Grandfather        ?  [name] Asthma Paternal Uncle     ROS: Review of Systems  Constitutional: Positive for malaise/fatigue and weight loss.  Gastrointestinal: Negative for nausea and vomiting.  Musculoskeletal:       Negative muscle weakness  Endo/Heme/Allergies:       Negative hypoglycemia    PHYSICAL EXAM: Blood pressure (!) 147/71, pulse 62, temperature 97.9 F (36.6 C), temperature source Oral, height 5\' 5"  (1.651 m), weight 178 lb (80.7 kg), SpO2 98 %. Body mass index is 29.62 kg/m. Physical Exam  Constitutional: She is oriented to person, place, and time. She appears well-developed and well-nourished.  Cardiovascular: Normal rate.   Pulmonary/Chest: Effort normal.  Musculoskeletal: Normal range of motion.  Neurological: She is oriented to person, place, and time.  Skin: Skin is warm and dry.  Psychiatric: She has a normal mood and affect. Her behavior is normal.  Vitals reviewed.   RECENT LABS AND TESTS: BMET    Component Value Date/Time   NA 142 03/04/2017 1016   K 4.3 03/04/2017 1016   CL 104 03/04/2017 1016   CO2 24 03/04/2017 1016   GLUCOSE 99 03/04/2017 1016   GLUCOSE 94 01/03/2017 1137   BUN 13 03/04/2017 1016   CREATININE 0.75 03/04/2017 1016   CREATININE 0.78 05/20/2015 1704   CALCIUM 10.1 03/04/2017 1016   GFRNONAA 78 03/04/2017 1016   GFRAA 90 03/04/2017 1016   Lab Results  Component Value Date   HGBA1C 5.7 (H) 03/04/2017   HGBA1C 6.0 01/03/2017   HGBA1C 6.0 10/05/2016   HGBA1C 5.8 04/03/2016   HGBA1C 6.0 10/07/2015   Lab Results  Component Value Date   INSULIN 11.1 03/04/2017   CBC    Component Value  Date/Time   WBC 5.8 01/03/2017 1137   RBC 4.63 01/03/2017 1137   HGB 14.7 01/03/2017 1137   HCT 43.9 01/03/2017 1137   PLT 246.0 01/03/2017 1137   MCV 94.8 01/03/2017 1137   MCH 31.9 05/20/2015 1704   MCHC 33.4 01/03/2017 1137   RDW 13.8 01/03/2017 1137   Iron/TIBC/Ferritin/ %Sat No results found for: IRON, TIBC, FERRITIN, IRONPCTSAT Lipid Panel     Component Value Date/Time  CHOL 164 01/03/2017 1137   TRIG 97.0 01/03/2017 1137   HDL 48.80 01/03/2017 1137   CHOLHDL 3 01/03/2017 1137   VLDL 19.4 01/03/2017 1137   LDLCALC 96 01/03/2017 1137   Hepatic Function Panel     Component Value Date/Time   PROT 6.5 03/04/2017 1016   ALBUMIN 4.3 03/04/2017 1016   AST 21 03/04/2017 1016   ALT 15 03/04/2017 1016   ALKPHOS 77 03/04/2017 1016   BILITOT 0.9 03/04/2017 1016   BILIDIR 0.1 05/31/2014 1612   IBILI 0.6 05/26/2013 0921      Component Value Date/Time   TSH 1.89 01/03/2017 1137   TSH 1.51 10/05/2016 1100   TSH 1.92 04/03/2016 0845    ASSESSMENT AND PLAN: Vitamin D deficiency - Plan: Vitamin D, Ergocalciferol, (DRISDOL) 50000 units CAPS capsule  Prediabetes  Class 1 obesity with serious comorbidity and body mass index (BMI) of 30.0 to 30.9 in adult, unspecified obesity type - Patient's BMI was over 30 when starting with [place].    PLAN:  Vitamin D Deficiency Christina [name] was informed that low vitamin D levels contributes to fatigue and are associated with obesity, breast, and colon cancer. She agrees to start to take prescription Vit D @50 ,000 IU every week #4 with no refills and will follow up for routine testing of vitamin D, at least 2-3 times per year. She was informed of the risk of over-replacement of vitamin D and agrees to not increase her dose unless he discusses this with [place] first. Shere agrees to follow up with our clinic in 2 weeks.  Pre-Diabetes Christina [name] will continue to work on weight loss, exercise, and decreasing simple carbohydrates in her diet to help  decrease the risk of diabetes. We dicussed metformin including benefits and risks. She was informed that eating too many simple carbohydrates or too many calories at one sitting increases the likelihood of GI side effects. Christina [name] declined metformin for now and a prescription was not written today. Christina [name] agreed to continue with lifestyle modifications and follow up with [place] as directed to monitor her progress.  Obesity Christina [name] is currently in the action stage of change. As such, her goal is to continue with weight loss efforts She has agreed to follow the Category 1 plan Christina [name] has been instructed to work up to a goal of 150 minutes of combined cardio and strengthening exercise per week for weight loss and overall health benefits. We discussed the following Behavioral Modification Strategies today: increasing lean protein intake, decreasing simple carbohydrates  and emotional eating strategies  Christina [name] has agreed to follow up with our clinic in 2 weeks. She was informed of the importance of frequent follow up visits to maximize her success with intensive lifestyle modifications for her multiple health conditions.  I, Christina [name] Nest, am acting as transcriptionist for Dennard Nip, MD  I have reviewed the above documentation for accuracy and completeness, and I agree with the above. -Dennard Nip, MD  Office: [phone]  /  Fax: [phone]  OBESITY BEHAVIORAL INTERVENTION VISIT  Today's visit was # 2 out of 9.  Starting weight: 184 lbs Starting date: 03/04/17 Today's weight : 178 lbs Today's date: 03/18/2017 Total lbs lost to date: 6 (Patients must lose 7 lbs in the first 6 months to continue with counseling)   ASK: We discussed the diagnosis of obesity with Christina [name] today and Christina [name] agreed to give [place] permission to discuss obesity behavioral modification therapy today.  ASSESS: Christina [name] has the diagnosis of obesity and her BMI  today is 29.7 Christina [name] is in the  action stage of change   ADVISE: Landis was educated on the multiple health risks of obesity as well as the benefit of weight loss to improve her health. She was advised of the need for long term treatment and the importance of lifestyle modifications.  AGREE: Multiple dietary modification options and treatment options were discussed and  Christina [name] agreed to follow the Category 1 plan We discussed the following Behavioral Modification Strategies today: increasing lean protein intake, decreasing simple carbohydrates  and emotional eating strategies

## 2017-03-22 ENCOUNTER — Ambulatory Visit (INDEPENDENT_AMBULATORY_CARE_PROVIDER_SITE_OTHER): Payer: Medicare Other | Admitting: Family Medicine

## 2017-03-22 ENCOUNTER — Encounter: Payer: Self-pay | Admitting: Family Medicine

## 2017-03-22 DIAGNOSIS — R197 Diarrhea, unspecified: Secondary | ICD-10-CM

## 2017-03-22 DIAGNOSIS — M8580 Other specified disorders of bone density and structure, unspecified site: Secondary | ICD-10-CM | POA: Diagnosis not present

## 2017-03-22 DIAGNOSIS — R739 Hyperglycemia, unspecified: Secondary | ICD-10-CM | POA: Diagnosis not present

## 2017-03-22 DIAGNOSIS — I10 Essential (primary) hypertension: Secondary | ICD-10-CM | POA: Diagnosis not present

## 2017-03-22 NOTE — Progress Notes (Signed)
Subjective:  I acted as a Education administrator for Dr. Charlett Blake. [place], [place]  Patient ID: MARAYA GWILLIAM, female    DOB: Jun 27, 1941, 76 y.o.   MRN: [id number]  Chief Complaint  Patient presents with  . Follow-up    HPI  Patient is in today for a 3 month follow up. She continues to struggle with frequent loose stools since last time she was seen. She has several watery stool daily. No bloody or tarry stool. Does note abdominal cramping. No change in diet helps. No polyruia or polydipsia. Denies CP/palp/SOB/HA/congestion/fevers or GU c/o. Taking meds as prescribed  Patient Care Team: Mosie Lukes, MD as PCP - General (Family Medicine) Rolm Bookbinder, MD as Consulting Physician (General Surgery) Monna Fam, MD as Consulting Physician (Ophthalmology) Garry Heater, DDS as Consulting Physician (Dentistry)   Past Medical History:  Diagnosis Date  . Acid reflux   . Anxiety   . Asthma    a little?  . Back pain   . Cervical cancer screening 01/31/2012  . Chicken pox as a child  . Depression with anxiety 06/07/2009   Qualifier: Diagnosis of  By: [name] MD, [name]    . Dermatitis of external ear 07/11/2012  . Diarrhea 03/22/2017  . Dysphagia 01/31/2012  . Heartburn   . Hiatal hernia with gastroesophageal reflux 10/26/2010   Qualifier: Diagnosis of  By: [name] MD, [name]    . Hyperglycemia 06/21/2013  . Hyperlipidemia, mixed 10/16/2015  . Hypertension   . Joint pain   . Left hip pain 07/10/2015  . Lesion of breast 12/03/2016  . Low back pain 05/29/2015  . Measles as a child  . Mumps as a child  . Neck pain on left side 06/21/2013  . Obesity 10/07/2008   Qualifier: Diagnosis of  By: [name] MD, [name]    . Osteopenia 01/03/2017  . Overweight (BMI 25.0-29.9) 10/07/2008   Qualifier: Diagnosis of  By: [name] MD, [name]    . Palpitations   . Prediabetes   . Preventative health care 01/31/2012  . Routine health maintenance 01/31/2012  . RUQ pain 05/29/2015  . Sinusitis,  acute 07/10/2012  . SOB (shortness of breath)   . Spinal stenosis   . Umbilical hernia   . Urinary frequency 09/28/2011  . Vertigo     Past Surgical History:  Procedure Laterality Date  . BREAST BIOPSY Right 1980  . BREAST EXCISIONAL BIOPSY Left 1970  . BREAST EXCISIONAL BIOPSY Right   . CYSTECTOMY     abdomen  . TUBAL LIGATION  1970    Family History  Problem Relation Age of Onset  . Cancer Mother 76       lung- smoker, ovarian  . Hypertension Mother   . Anxiety disorder Mother   . Alcohol abuse Father   . Cancer Sister        ovarian  . Diabetes Sister   . Hypertension Sister   . Other Maternal Grandmother        pacemaker  . Multiple sclerosis Maternal Grandfather        ?  [name] Asthma Paternal Uncle     Social History   Social History  . Marital status: Widowed    Spouse name: N/A  . Number of children: N/A  . Years of education: N/A   Occupational History  . retired    Social History Main Topics  . Smoking status: Former Smoker    Packs/day: 1.00    Years: 30.00    Types: Cigarettes  Quit date: 08/27/1990  . Smokeless tobacco: Never Used  . Alcohol use No  . Drug use: No  . Sexual activity: No     Comment: lives alone, no dietary restrictions   Other Topics Concern  . Not on file   Social History Narrative  . No narrative on file    Outpatient Medications Prior to Visit  Medication Sig Dispense Refill  . fish oil-omega-3 fatty acids 1000 MG capsule Take 1 g by mouth daily.      [name] losartan (COZAAR) 50 MG tablet Take 1 tablet (50 mg total) by mouth daily. 90 tablet 3  . magnesium gluconate (MAGONATE) 500 MG tablet Take 500 mg by mouth daily.    . Probiotic Product (PROBIOTIC-10 PO) Take by mouth.    . triamcinolone cream (KENALOG) 0.1 % Apply topically 2 (two) times daily. (Patient taking differently: Apply 1 application topically as needed. ) 80 g 0  . Vitamin D, Ergocalciferol, (DRISDOL) 50000 units CAPS capsule Take 1 capsule (50,000 Units  total) by mouth every 7 (seven) days. 4 capsule 0  . Triamcinolone Acetonide (TRIAMCINOLONE 0.1 % CREAM : EUCERIN) CREA Apply 1 application topically 2 (two) times daily as needed. 1 each 5   No facility-administered medications prior to visit.     Allergies  Allergen Reactions  . Hydrochlorothiazide Cough  . Lisinopril Cough    Review of Systems  Constitutional: Negative for fever and malaise/fatigue.  HENT: Negative for congestion.   Eyes: Negative for blurred vision.  Respiratory: Negative for cough and shortness of breath.   Cardiovascular: Negative for chest pain, palpitations and leg swelling.  Gastrointestinal: Positive for abdominal pain and diarrhea. Negative for blood in stool, melena and vomiting.  Musculoskeletal: Negative for back pain.  Skin: Negative for rash.  Neurological: Negative for loss of consciousness and headaches.       Objective:    Physical Exam  Constitutional: She is oriented to person, place, and time. She appears well-developed and well-nourished. No distress.  HENT:  Head: Normocephalic and atraumatic.  Eyes: Conjunctivae are normal.  Neck: Normal range of motion. No thyromegaly present.  Cardiovascular: Normal rate and regular rhythm.   Pulmonary/Chest: Effort normal and breath sounds normal. She has no wheezes.  Abdominal: Soft. Bowel sounds are normal. There is no tenderness.  Musculoskeletal: Normal range of motion. She exhibits no edema or deformity.  Neurological: She is alert and oriented to person, place, and time.  Skin: Skin is warm and dry. She is not diaphoretic.  Psychiatric: She has a normal mood and affect.    BP (!) 110/58 (BP Location: Left Arm, Patient Position: Sitting, Cuff Size: Normal)   Pulse 66   Temp 98.2 F (36.8 C) (Oral)   Resp 18   Wt 181 lb 6.4 oz (82.3 kg)   SpO2 96%   BMI 30.19 kg/m  Wt Readings from Last 3 Encounters:  03/22/17 181 lb 6.4 oz (82.3 kg)  03/18/17 178 lb (80.7 kg)  03/04/17 184 lb  (83.5 kg)   BP Readings from Last 3 Encounters:  03/22/17 (!) 110/58  03/18/17 (!) 147/71  03/04/17 (!) 157/73     Immunization History  Administered Date(s) Administered  . Influenza Whole 06/07/2009, 05/28/2011, 05/28/2012  . Influenza, High Dose Seasonal PF 05/20/2015  . Influenza,inj,Quad PF,36+ Mos 05/26/2013, 05/31/2014  . Pneumococcal Conjugate-13 05/31/2014  . Pneumococcal Polysaccharide-23 06/07/2009  . Td 01/31/2003  . Tdap 09/28/2011    Health Maintenance  Topic Date Due  . INFLUENZA VACCINE  03/27/2017  . Fecal DNA (Cologuard)  02/14/2018  . TETANUS/TDAP  09/27/2021  . DEXA SCAN  Completed  . PNA vac Low Risk Adult  Completed    Lab Results  Component Value Date   WBC 5.8 01/03/2017   HGB 14.7 01/03/2017   HCT 43.9 01/03/2017   PLT 246.0 01/03/2017   GLUCOSE 99 03/04/2017   CHOL 164 01/03/2017   TRIG 97.0 01/03/2017   HDL 48.80 01/03/2017   LDLCALC 96 01/03/2017   ALT 15 03/04/2017   AST 21 03/04/2017   NA 142 03/04/2017   K 4.3 03/04/2017   CL 104 03/04/2017   CREATININE 0.75 03/04/2017   BUN 13 03/04/2017   CO2 24 03/04/2017   TSH 1.89 01/03/2017   HGBA1C 5.7 (H) 03/04/2017    Lab Results  Component Value Date   TSH 1.89 01/03/2017   Lab Results  Component Value Date   WBC 5.8 01/03/2017   HGB 14.7 01/03/2017   HCT 43.9 01/03/2017   MCV 94.8 01/03/2017   PLT 246.0 01/03/2017   Lab Results  Component Value Date   NA 142 03/04/2017   K 4.3 03/04/2017   CO2 24 03/04/2017   GLUCOSE 99 03/04/2017   BUN 13 03/04/2017   CREATININE 0.75 03/04/2017   BILITOT 0.9 03/04/2017   ALKPHOS 77 03/04/2017   AST 21 03/04/2017   ALT 15 03/04/2017   PROT 6.5 03/04/2017   ALBUMIN 4.3 03/04/2017   CALCIUM 10.1 03/04/2017   GFR 73.26 01/03/2017   Lab Results  Component Value Date   CHOL 164 01/03/2017   Lab Results  Component Value Date   HDL 48.80 01/03/2017   Lab Results  Component Value Date   LDLCALC 96 01/03/2017   Lab Results    Component Value Date   TRIG 97.0 01/03/2017   Lab Results  Component Value Date   CHOLHDL 3 01/03/2017   Lab Results  Component Value Date   HGBA1C 5.7 (H) 03/04/2017         Assessment & Plan:   Problem List Items Addressed This Visit    Essential hypertension, benign    Well controlled, no changes to meds. Encouraged heart healthy diet such as the DASH diet and exercise as tolerated.       Hyperglycemia    hgba1c acceptable, minimize simple carbs. Increase exercise as tolerated.       Osteopenia    Encouraged to get adequate exercise, calcium and vitamin d intake      Diarrhea    Referred to gastroenterology for further consideration due to change in bowel habits and increasing discomfort. She sees Dr Collene Mares      Relevant Orders   Ambulatory referral to Gastroenterology      I have discontinued Ms. Gall triamcinolone 0.1 % cream : eucerin. I am also having her maintain her fish oil-omega-3 fatty acids, magnesium gluconate, Probiotic Product (PROBIOTIC-10 PO), losartan, triamcinolone cream, and Vitamin D (Ergocalciferol).  No orders of the defined types were placed in this encounter.   CMA served as Education administrator during this visit. History, Physical and Plan performed by medical provider. Documentation and orders reviewed and attested to.  Penni Homans, MD

## 2017-03-22 NOTE — Patient Instructions (Addendum)
Add Benefiber twice a day  Diarrhea, Adult Diarrhea is frequent loose and watery bowel movements. Diarrhea can make you feel weak and cause you to become dehydrated. Dehydration can make you tired and thirsty, cause you to have a dry mouth, and decrease how often you urinate. Diarrhea typically lasts 2-3 days. However, it can last longer if it is a sign of something more serious. It is important to treat your diarrhea as told by your health care provider. Follow these instructions at home: Eating and drinking  Follow these recommendations as told by your health care provider:  Take an oral rehydration solution (ORS). This is a drink that is sold at pharmacies and retail stores.  Drink clear fluids, such as water, ice chips, diluted fruit juice, and low-calorie sports drinks.  Eat bland, easy-to-digest foods in small amounts as you are able. These foods include bananas, applesauce, rice, lean meats, toast, and crackers.  Avoid drinking fluids that contain a lot of sugar or caffeine, such as energy drinks, sports drinks, and soda.  Avoid alcohol.  Avoid spicy or fatty foods.  General instructions  Drink enough fluid to keep your urine clear or pale yellow.  Wash your hands often. If soap and water are not available, use hand sanitizer.  Make sure that all people in your household wash their hands well and often.  Take over-the-counter and prescription medicines only as told by your health care provider.  Rest at home while you recover.  Watch your condition for any changes.  Take a warm bath to relieve any burning or pain from frequent diarrhea episodes.  Keep all follow-up visits as told by your health care provider. This is important. Contact a health care provider if:  You have a fever.  Your diarrhea gets worse.  You have new symptoms.  You cannot keep fluids down.  You feel light-headed or dizzy.  You have a headache  You have muscle cramps. Get help right away  if:  You have chest pain.  You feel extremely weak or you faint.  You have bloody or black stools or stools that look like tar.  You have severe pain, cramping, or bloating in your abdomen.  You have trouble breathing or you are breathing very quickly.  Your heart is beating very quickly.  Your skin feels cold and clammy.  You feel confused.  You have signs of dehydration, such as: ? Dark urine, very little urine, or no urine. ? Cracked lips. ? Dry mouth. ? Sunken eyes. ? Sleepiness. ? Weakness. This information is not intended to replace advice given to you by your health care provider. Make sure you discuss any questions you have with your health care provider. Document Released: 08/03/2002 Document Revised: 12/22/2015 Document Reviewed: 04/19/2015 Elsevier Interactive Patient Education  2017 [employer].

## 2017-03-22 NOTE — Assessment & Plan Note (Signed)
Well controlled, no changes to meds. Encouraged heart healthy diet such as the DASH diet and exercise as tolerated.  °

## 2017-03-24 NOTE — Assessment & Plan Note (Signed)
Encouraged to get adequate exercise, calcium and vitamin d intake 

## 2017-03-24 NOTE — Assessment & Plan Note (Signed)
hgba1c acceptable, minimize simple carbs. Increase exercise as tolerated.  

## 2017-03-24 NOTE — Assessment & Plan Note (Signed)
Referred to gastroenterology for further consideration due to change in bowel habits and increasing discomfort. She sees Dr Collene Mares

## 2017-04-01 ENCOUNTER — Ambulatory Visit (INDEPENDENT_AMBULATORY_CARE_PROVIDER_SITE_OTHER): Payer: Medicare Other | Admitting: Family Medicine

## 2017-04-01 ENCOUNTER — Other Ambulatory Visit: Payer: Self-pay | Admitting: Family Medicine

## 2017-04-01 VITALS — BP 122/67 | HR 62 | Temp 98.1°F | Ht 65.0 in | Wt 176.0 lb

## 2017-04-01 DIAGNOSIS — E669 Obesity, unspecified: Secondary | ICD-10-CM | POA: Diagnosis not present

## 2017-04-01 DIAGNOSIS — I10 Essential (primary) hypertension: Secondary | ICD-10-CM

## 2017-04-01 DIAGNOSIS — Z6830 Body mass index (BMI) 30.0-30.9, adult: Secondary | ICD-10-CM

## 2017-04-01 NOTE — Progress Notes (Signed)
Office: [phone]  /  Fax: [phone]   HPI:   Chief Complaint: OBESITY Christina [name] is here to discuss her progress with her obesity treatment plan. She is on the  follow the Category 1 plan and is following her eating plan approximately 99 % of the time. She states she is exercising 0 minutes 0 times per week. Christina [name] continues to do well with weight loss but is getting bored with the plan, especially with the dinner. Christina [name] is not exercising currently but is ready to start. She is still eating out frequently. Her weight is 176 lb (79.8 kg) today and has had a weight loss of 2 pounds over a period of 2 weeks since her last visit. She has lost 8 lbs since starting treatment with [place].  Christina [name] is a 76 y.o. female with Christina. Her blood pressure is stable with medications and with diet. Christina [name] denies chest pain, headache or lightheadedness. She is working weight loss to help control her blood pressure with the goal of decreasing her risk of heart attack and stroke. Patricias blood pressure is currently controlled.   ALLERGIES: Allergies  Allergen Reactions  . Hydrochlorothiazide Cough  . Lisinopril Cough    MEDICATIONS: Current Outpatient Prescriptions on File Prior to Visit  Medication Sig Dispense Refill  . fish oil-omega-3 fatty acids 1000 MG capsule Take 1 g by mouth daily.      . magnesium gluconate (MAGONATE) 500 MG tablet Take 500 mg by mouth daily.    . Probiotic Product (PROBIOTIC-10 PO) Take by mouth.    . triamcinolone cream (KENALOG) 0.1 % Apply topically 2 (two) times daily. (Patient taking differently: Apply 1 application topically as needed. ) 80 g 0  . Vitamin D, Ergocalciferol, (DRISDOL) 50000 units CAPS capsule Take 1 capsule (50,000 Units total) by mouth every 7 (seven) days. 4 capsule 0   No current facility-administered medications on file prior to visit.     PAST MEDICAL HISTORY: Past Medical History:  Diagnosis  Date  . Acid reflux   . Anxiety   . Asthma    a little?  . Back pain   . Cervical cancer screening 01/31/2012  . Chicken pox as a child  . Depression with anxiety 06/07/2009   Qualifier: Diagnosis of  By: [name] MD, [name]    . Dermatitis of external ear 07/11/2012  . Diarrhea 03/22/2017  . Dysphagia 01/31/2012  . Heartburn   . Hiatal hernia with gastroesophageal reflux 10/26/2010   Qualifier: Diagnosis of  By: [name] MD, [name]    . Hyperglycemia 06/21/2013  . Hyperlipidemia, mixed 10/16/2015  . Christina   . Joint pain   . Left hip pain 07/10/2015  . Lesion of breast 12/03/2016  . Low back pain 05/29/2015  . Measles as a child  . Mumps as a child  . Neck pain on left side 06/21/2013  . Obesity 10/07/2008   Qualifier: Diagnosis of  By: [name] MD, [name]    . Osteopenia 01/03/2017  . Overweight (BMI 25.0-29.9) 10/07/2008   Qualifier: Diagnosis of  By: [name] MD, [name]    . Palpitations   . Prediabetes   . Preventative health care 01/31/2012  . Routine health maintenance 01/31/2012  . RUQ pain 05/29/2015  . Sinusitis, acute 07/10/2012  . SOB (shortness of breath)   . Spinal stenosis   . Umbilical hernia   . Urinary frequency 09/28/2011  . Vertigo     PAST SURGICAL HISTORY: Past Surgical History:  Procedure  Laterality Date  . BREAST BIOPSY Right 1980  . BREAST EXCISIONAL BIOPSY Left 1970  . BREAST EXCISIONAL BIOPSY Right   . CYSTECTOMY     abdomen  . TUBAL LIGATION  1970    SOCIAL HISTORY: Social History  Substance Use Topics  . Smoking status: Former Smoker    Packs/day: 1.00    Years: 30.00    Types: Cigarettes    Quit date: 08/27/1990  . Smokeless tobacco: Never Used  . Alcohol use No    FAMILY HISTORY: Family History  Problem Relation Age of Onset  . Cancer Mother 56       lung- smoker, ovarian  . Christina Mother   . Anxiety disorder Mother   . Alcohol abuse Father   . Cancer Sister        ovarian  . Diabetes Sister   . Christina  Sister   . Other Maternal Grandmother        pacemaker  . Multiple sclerosis Maternal Grandfather        ?  [name] Asthma Paternal Uncle     ROS: Review of Systems  Constitutional: Positive for weight loss.  Cardiovascular: Negative for chest pain.  Neurological: Negative for headaches.       Negative lightheadedness    PHYSICAL EXAM: Blood pressure 122/67, pulse 62, temperature 98.1 F (36.7 C), temperature source Oral, height 5\' 5"  (1.651 m), weight 176 lb (79.8 kg), SpO2 96 %. Body mass index is 29.29 kg/m. Physical Exam  Constitutional: She is oriented to person, place, and time. She appears well-developed and well-nourished.  Cardiovascular: Normal rate.   Pulmonary/Chest: Effort normal.  Neurological: She is oriented to person, place, and time.  Skin: Skin is warm and dry.  Psychiatric: She has a normal mood and affect. Her behavior is normal.  Vitals reviewed.   RECENT LABS AND TESTS: BMET    Component Value Date/Time   NA 142 03/04/2017 1016   K 4.3 03/04/2017 1016   CL 104 03/04/2017 1016   CO2 24 03/04/2017 1016   GLUCOSE 99 03/04/2017 1016   GLUCOSE 94 01/03/2017 1137   BUN 13 03/04/2017 1016   CREATININE 0.75 03/04/2017 1016   CREATININE 0.78 05/20/2015 1704   CALCIUM 10.1 03/04/2017 1016   GFRNONAA 78 03/04/2017 1016   GFRAA 90 03/04/2017 1016   Lab Results  Component Value Date   HGBA1C 5.7 (H) 03/04/2017   HGBA1C 6.0 01/03/2017   HGBA1C 6.0 10/05/2016   HGBA1C 5.8 04/03/2016   HGBA1C 6.0 10/07/2015   Lab Results  Component Value Date   INSULIN 11.1 03/04/2017   CBC    Component Value Date/Time   WBC 5.8 01/03/2017 1137   RBC 4.63 01/03/2017 1137   HGB 14.7 01/03/2017 1137   HCT 43.9 01/03/2017 1137   PLT 246.0 01/03/2017 1137   MCV 94.8 01/03/2017 1137   MCH 31.9 05/20/2015 1704   MCHC 33.4 01/03/2017 1137   RDW 13.8 01/03/2017 1137   Iron/TIBC/Ferritin/ %Sat No results found for: IRON, TIBC, FERRITIN, IRONPCTSAT Lipid Panel       Component Value Date/Time   CHOL 164 01/03/2017 1137   TRIG 97.0 01/03/2017 1137   HDL 48.80 01/03/2017 1137   CHOLHDL 3 01/03/2017 1137   VLDL 19.4 01/03/2017 1137   LDLCALC 96 01/03/2017 1137   Hepatic Function Panel     Component Value Date/Time   PROT 6.5 03/04/2017 1016   ALBUMIN 4.3 03/04/2017 1016   AST 21 03/04/2017 1016   ALT  15 03/04/2017 1016   ALKPHOS 77 03/04/2017 1016   BILITOT 0.9 03/04/2017 1016   BILIDIR 0.1 05/31/2014 1612   IBILI 0.6 05/26/2013 0921      Component Value Date/Time   TSH 1.89 01/03/2017 1137   TSH 1.51 10/05/2016 1100   TSH 1.92 04/03/2016 0845    ASSESSMENT AND PLAN: Essential Christina  Class 1 obesity with serious comorbidity and body mass index (BMI) of 30.0 to 30.9 in adult, unspecified obesity type - Starting BMI greater then 30  PLAN:  Christina We discussed sodium restriction, working on healthy weight loss, and a regular exercise program as the means to achieve improved blood pressure control. Shawni agreed with this plan and agreed to follow up as directed. We will continue to monitor her blood pressure as well as her progress with the above lifestyle modifications. She will continue her medications as prescribed and will watch for signs of hypotension as she continues her lifestyle modifications.  We spent > than 50% of the 15 minute visit on the counseling as documented in the note.   Obesity Sharise is currently in the action stage of change. As such, her goal is to continue with weight loss efforts She has agreed to follow the Category 1 plan + dinner options Leeana has been instructed to work up to a goal of 150 minutes of combined cardio and strengthening exercise per week or start exercise video (she has tapes at home) for weight loss and overall health benefits. We discussed the following Behavioral Modification Strategies today: no skipping meals, increasing vegetables and decrease eating out  Ayisha  has agreed to follow up with our clinic in 2 weeks. She was informed of the importance of frequent follow up visits to maximize her success with intensive lifestyle modifications for her multiple health conditions.  I, Doreene Nest, am acting as transcriptionist for Dennard Nip, MD  I have reviewed the above documentation for accuracy and completeness, and I agree with the above. -Dennard Nip, MD  OBESITY BEHAVIORAL INTERVENTION VISIT  Today's visit was # 3 out of 22.  Starting weight: 184 lbs Starting date: 03/04/17 Today's weight : 176 lbs Today's date: 04/01/2017 Total lbs lost to date: 8 (Patients must lose 7 lbs in the first 6 months to continue with counseling)   ASK: We discussed the diagnosis of obesity with [name] today and Evalie agreed to give [place] permission to discuss obesity behavioral modification therapy today.  ASSESS: Jayleen has the diagnosis of obesity and her BMI today is 29.3 Latavia is in the action stage of change   ADVISE: Cortasia was educated on the multiple health risks of obesity as well as the benefit of weight loss to improve her health. She was advised of the need for long term treatment and the importance of lifestyle modifications.  AGREE: Multiple dietary modification options and treatment options were discussed and  Caiden agreed to follow the Category 1 plan + dinner options We discussed the following Behavioral Modification Strategies today: no skipping meals, increasing vegetables and decrease eating out

## 2017-04-05 ENCOUNTER — Encounter (INDEPENDENT_AMBULATORY_CARE_PROVIDER_SITE_OTHER): Payer: Self-pay | Admitting: Family Medicine

## 2017-04-05 ENCOUNTER — Ambulatory Visit: Payer: Medicare Other | Admitting: Family Medicine

## 2017-04-17 ENCOUNTER — Ambulatory Visit (INDEPENDENT_AMBULATORY_CARE_PROVIDER_SITE_OTHER): Payer: Medicare Other | Admitting: Family Medicine

## 2017-04-17 VITALS — BP 121/71 | HR 63 | Temp 97.9°F | Ht 65.0 in | Wt 171.0 lb

## 2017-04-17 DIAGNOSIS — E559 Vitamin D deficiency, unspecified: Secondary | ICD-10-CM

## 2017-04-17 DIAGNOSIS — Z6830 Body mass index (BMI) 30.0-30.9, adult: Secondary | ICD-10-CM

## 2017-04-17 DIAGNOSIS — E669 Obesity, unspecified: Secondary | ICD-10-CM | POA: Diagnosis not present

## 2017-04-17 MED ORDER — VITAMIN D (ERGOCALCIFEROL) 1.25 MG (50000 UNIT) PO CAPS: 50000.0000 [IU] | ORAL_CAPSULE | ORAL | 0 refills | Status: DC

## 2017-04-17 NOTE — Progress Notes (Signed)
Office: [phone]  /  Fax: [phone]   HPI:   Chief Complaint: OBESITY Christina [name] is here to discuss her progress with her obesity treatment plan. She is on the  follow the Category 1 plan and is following her eating plan approximately 98 % of the time. She states she is exercising by strengthening and stretching minutes 6 times per week. Patient has done very well with weight loss but is loosing a bit too much H20 weight. She worked in the yard for hours each day, trying to drink an increase of H20 but notes positional vertigo when she stands up quickly.  Her weight is 171 lb (77.6 kg) today and has had a weight loss of 5 pounds over a period of 2 weeks since her last visit. She has lost 13 lbs since starting treatment with [place].  Vitamin D deficiency [name] has a diagnosis of vitamin D deficiency. She is currently taking vit D but has not met her goal. She denies nausea, vomiting or muscle weakness.   ALLERGIES: Allergies  Allergen Reactions  . Nutrasweet Aspartame [Aspartame] Diarrhea  . Hydrochlorothiazide Cough  . Lisinopril Cough    MEDICATIONS: Current Outpatient Prescriptions on File Prior to Visit  Medication Sig Dispense Refill  . fish oil-omega-3 fatty acids 1000 MG capsule Take 1 g by mouth daily.      [name] losartan (COZAAR) 50 MG tablet TAKE 1 TABLET BY MOUTH EVERY DAY 90 tablet 3  . magnesium gluconate (MAGONATE) 500 MG tablet Take 500 mg by mouth daily.    . Probiotic Product (PROBIOTIC-10 PO) Take by mouth.    . triamcinolone cream (KENALOG) 0.1 % Apply topically 2 (two) times daily. (Patient taking differently: Apply 1 application topically as needed. ) 80 g 0   No current facility-administered medications on file prior to visit.     PAST MEDICAL HISTORY: Past Medical History:  Diagnosis Date  . Acid reflux   . Anxiety   . Asthma    a little?  . Back pain   . Cervical cancer screening 01/31/2012  . Chicken pox as a child  . Depression with anxiety  06/07/2009   Qualifier: Diagnosis of  By: [name] MD, [name]    . Dermatitis of external ear 07/11/2012  . Diarrhea 03/22/2017  . Dysphagia 01/31/2012  . Heartburn   . Hiatal hernia with gastroesophageal reflux 10/26/2010   Qualifier: Diagnosis of  By: [name] MD, [name]    . Hyperglycemia 06/21/2013  . Hyperlipidemia, mixed 10/16/2015  . Hypertension   . Joint pain   . Left hip pain 07/10/2015  . Lesion of breast 12/03/2016  . Low back pain 05/29/2015  . Measles as a child  . Mumps as a child  . Neck pain on left side 06/21/2013  . Obesity 10/07/2008   Qualifier: Diagnosis of  By: [name] MD, [name]    . Osteopenia 01/03/2017  . Overweight (BMI 25.0-29.9) 10/07/2008   Qualifier: Diagnosis of  By: [name] MD, [name]    . Palpitations   . Prediabetes   . Preventative health care 01/31/2012  . Routine health maintenance 01/31/2012  . RUQ pain 05/29/2015  . Sinusitis, acute 07/10/2012  . SOB (shortness of breath)   . Spinal stenosis   . Umbilical hernia   . Urinary frequency 09/28/2011  . Vertigo     PAST SURGICAL HISTORY: Past Surgical History:  Procedure Laterality Date  . BREAST BIOPSY Right 1980  . BREAST EXCISIONAL BIOPSY Left 1970  . BREAST EXCISIONAL BIOPSY  Right   . CYSTECTOMY     abdomen  . TUBAL LIGATION  1970    SOCIAL HISTORY: Social History  Substance Use Topics  . Smoking status: Former Smoker    Packs/day: 1.00    Years: 30.00    Types: Cigarettes    Quit date: 08/27/1990  . Smokeless tobacco: Never Used  . Alcohol use No    FAMILY HISTORY: Family History  Problem Relation Age of Onset  . Cancer Mother 32       lung- smoker, ovarian  . Hypertension Mother   . Anxiety disorder Mother   . Alcohol abuse Father   . Cancer Sister        ovarian  . Diabetes Sister   . Hypertension Sister   . Other Maternal Grandmother        pacemaker  . Multiple sclerosis Maternal Grandfather        ?  [name] Asthma Paternal Uncle     ROS: Review of Systems    Constitutional: Positive for weight loss.  All other systems reviewed and are negative.   PHYSICAL EXAM: Blood pressure 121/71, pulse 63, temperature 97.9 F (36.6 C), temperature source Oral, height '5\' 5"'  (1.651 m), weight 171 lb (77.6 kg), SpO2 97 %. Body mass index is 28.46 kg/m. Physical Exam  Constitutional: She is oriented to person, place, and time. She appears well-developed and well-nourished.  Eyes: EOM are normal.  Neck: Normal range of motion.  Pulmonary/Chest: Effort normal.  Musculoskeletal: Normal range of motion.  Neurological: She is alert and oriented to person, place, and time.  Skin: Skin is warm and dry.  Psychiatric: She has a normal mood and affect.  Vitals reviewed.   RECENT LABS AND TESTS: BMET    Component Value Date/Time   NA 142 03/04/2017 1016   K 4.3 03/04/2017 1016   CL 104 03/04/2017 1016   CO2 24 03/04/2017 1016   GLUCOSE 99 03/04/2017 1016   GLUCOSE 94 01/03/2017 1137   BUN 13 03/04/2017 1016   CREATININE 0.75 03/04/2017 1016   CREATININE 0.78 05/20/2015 1704   CALCIUM 10.1 03/04/2017 1016   GFRNONAA 78 03/04/2017 1016   GFRAA 90 03/04/2017 1016   Lab Results  Component Value Date   HGBA1C 5.7 (H) 03/04/2017   HGBA1C 6.0 01/03/2017   HGBA1C 6.0 10/05/2016   HGBA1C 5.8 04/03/2016   HGBA1C 6.0 10/07/2015   Lab Results  Component Value Date   INSULIN 11.1 03/04/2017   CBC    Component Value Date/Time   WBC 5.8 01/03/2017 1137   RBC 4.63 01/03/2017 1137   HGB 14.7 01/03/2017 1137   HCT 43.9 01/03/2017 1137   PLT 246.0 01/03/2017 1137   MCV 94.8 01/03/2017 1137   MCH 31.9 05/20/2015 1704   MCHC 33.4 01/03/2017 1137   RDW 13.8 01/03/2017 1137   Iron/TIBC/Ferritin/ %Sat No results found for: IRON, TIBC, FERRITIN, IRONPCTSAT Lipid Panel     Component Value Date/Time   CHOL 164 01/03/2017 1137   TRIG 97.0 01/03/2017 1137   HDL 48.80 01/03/2017 1137   CHOLHDL 3 01/03/2017 1137   VLDL 19.4 01/03/2017 1137   LDLCALC 96  01/03/2017 1137   Hepatic Function Panel     Component Value Date/Time   PROT 6.5 03/04/2017 1016   ALBUMIN 4.3 03/04/2017 1016   AST 21 03/04/2017 1016   ALT 15 03/04/2017 1016   ALKPHOS 77 03/04/2017 1016   BILITOT 0.9 03/04/2017 1016   BILIDIR 0.1 05/31/2014 1612  IBILI 0.6 05/26/2013 0921      Component Value Date/Time   TSH 1.89 01/03/2017 1137   TSH 1.51 10/05/2016 1100   TSH 1.92 04/03/2016 0845    ASSESSMENT AND PLAN: Vitamin D deficiency - Plan: Vitamin D, Ergocalciferol, (DRISDOL) 50000 units CAPS capsule  Class 1 obesity with serious comorbidity and body mass index (BMI) of 30.0 to 30.9 in adult, unspecified obesity type - BMI greater than 30 when patient started program  PLAN:  Vitamin D Deficiency Tjuana was informed that low vitamin D levels contributes to fatigue and are associated with obesity, breast, and colon cancer. She agrees to continue to take prescription Vit D '@50' ,000 IU every week in which we refilled today with a quantity of 4 and no refills and will follow up for routine testing of vitamin D, at least 2-3 times per year. She was informed of the risk of over-replacement of vitamin D and agrees to not increase her dose unless he discusses this with [place] first.  Obesity  Arvis is currently in the action stage of change. As such, her goal is to continue with weight loss efforts She has agreed to follow the Category 1 plan [place] has been instructed to work up to a goal of 150 minutes of combined cardio and strengthening exercise per week for weight loss and overall health benefits. We discussed the following Behavioral Modification Stratagies today: increasing lean protein intake, decreasing simple carbohydrates no skipping meals   Patte has agreed to follow up with our clinic in 3 weeks. She was informed of the importance of frequent follow up visits to maximize her success with intensive lifestyle modifications for her multiple health  conditions.  I, Zion Ta , am acting as Location manager for Dennard Nip, MD  I have reviewed the above documentation for accuracy and completeness, and I agree with the above. -Dennard Nip, MD

## 2017-04-30 ENCOUNTER — Other Ambulatory Visit: Payer: Self-pay | Admitting: General Surgery

## 2017-05-01 ENCOUNTER — Other Ambulatory Visit: Payer: Self-pay | Admitting: General Surgery

## 2017-05-01 DIAGNOSIS — E279 Disorder of adrenal gland, unspecified: Principal | ICD-10-CM

## 2017-05-01 DIAGNOSIS — E278 Other specified disorders of adrenal gland: Secondary | ICD-10-CM

## 2017-05-02 ENCOUNTER — Ambulatory Visit (INDEPENDENT_AMBULATORY_CARE_PROVIDER_SITE_OTHER): Payer: Medicare Other | Admitting: Family Medicine

## 2017-05-02 VITALS — BP 117/70 | HR 61 | Temp 97.8°F | Ht 65.0 in | Wt 167.0 lb

## 2017-05-02 DIAGNOSIS — Z6830 Body mass index (BMI) 30.0-30.9, adult: Secondary | ICD-10-CM | POA: Diagnosis not present

## 2017-05-02 DIAGNOSIS — K5909 Other constipation: Secondary | ICD-10-CM

## 2017-05-02 DIAGNOSIS — E669 Obesity, unspecified: Secondary | ICD-10-CM

## 2017-05-02 MED ORDER — POLYETHYLENE GLYCOL 3350 17 GM/SCOOP PO POWD: 17.0000 g | Freq: Every day | ORAL | 0 refills | Status: DC

## 2017-05-02 NOTE — Progress Notes (Signed)
Office: [phone]  /  Fax: [phone]   HPI:   Chief Complaint: OBESITY Christina [name] is here to discuss her progress with her obesity treatment plan. She is on the Category 1 plan and is following her eating plan approximately 90 % of the time. She states she is exercising stretches for 10 minutes daily. Christina [name] continues to do very well with weight loss. She is sometimes struggling to eat all her food. She is getting close to her maintenance weight. Christina [name] is going on vacation soon and needs dietary advice. Her weight is 167 lb (75.8 kg) today and has had a weight loss of 5 pounds over a period of 2 weeks since her last visit. She has lost 17 lbs since starting treatment with [place].  Constipation (new problem) Christina [name] notes less frequent BM, especially since changing some of her diet over Labor Day. She states BM are sometimes uncomfortable. She denies hematochezia or melena. She admits to drinking less H20 recently.  ALLERGIES: Allergies  Allergen Reactions  . Nutrasweet Aspartame [Aspartame] Diarrhea  . Hydrochlorothiazide Cough  . Lisinopril Cough    MEDICATIONS: Current Outpatient Prescriptions on File Prior to Visit  Medication Sig Dispense Refill  . fish oil-omega-3 fatty acids 1000 MG capsule Take 1 g by mouth daily.      Christina [name] losartan (COZAAR) 50 MG tablet TAKE 1 TABLET BY MOUTH EVERY DAY 90 tablet 3  . magnesium gluconate (MAGONATE) 500 MG tablet Take 500 mg by mouth daily.    . Probiotic Product (PROBIOTIC-10 PO) Take by mouth.    . triamcinolone cream (KENALOG) 0.1 % Apply topically 2 (two) times daily. (Patient taking differently: Apply 1 application topically as needed. ) 80 g 0  . Vitamin D, Ergocalciferol, (DRISDOL) 50000 units CAPS capsule Take 1 capsule (50,000 Units total) by mouth every 7 (seven) days. 4 capsule 0   No current facility-administered medications on file prior to visit.     PAST MEDICAL HISTORY: Past Medical History:  Diagnosis Date  . Acid  reflux   . Anxiety   . Asthma    a little?  . Back pain   . Cervical cancer screening 01/31/2012  . Chicken pox as a child  . Depression with anxiety 06/07/2009   Qualifier: Diagnosis of  By: [name] MD, [name]    . Dermatitis of external ear 07/11/2012  . Diarrhea 03/22/2017  . Dysphagia 01/31/2012  . Heartburn   . Hiatal hernia with gastroesophageal reflux 10/26/2010   Qualifier: Diagnosis of  By: [name] MD, [name]    . Hyperglycemia 06/21/2013  . Hyperlipidemia, mixed 10/16/2015  . Hypertension   . Joint pain   . Left hip pain 07/10/2015  . Lesion of breast 12/03/2016  . Low back pain 05/29/2015  . Measles as a child  . Mumps as a child  . Neck pain on left side 06/21/2013  . Obesity 10/07/2008   Qualifier: Diagnosis of  By: [name] MD, [name]    . Osteopenia 01/03/2017  . Overweight (BMI 25.0-29.9) 10/07/2008   Qualifier: Diagnosis of  By: [name] MD, [name]    . Palpitations   . Prediabetes   . Preventative health care 01/31/2012  . Routine health maintenance 01/31/2012  . RUQ pain 05/29/2015  . Sinusitis, acute 07/10/2012  . SOB (shortness of breath)   . Spinal stenosis   . Umbilical hernia   . Urinary frequency 09/28/2011  . Vertigo     PAST SURGICAL HISTORY: Past Surgical History:  Procedure Laterality Date  .  BREAST BIOPSY Right 1980  . BREAST EXCISIONAL BIOPSY Left 1970  . BREAST EXCISIONAL BIOPSY Right   . CYSTECTOMY     abdomen  . TUBAL LIGATION  1970    SOCIAL HISTORY: Social History  Substance Use Topics  . Smoking status: Former Smoker    Packs/day: 1.00    Years: 30.00    Types: Cigarettes    Quit date: 08/27/1990  . Smokeless tobacco: Never Used  . Alcohol use No    FAMILY HISTORY: Family History  Problem Relation Age of Onset  . Cancer Mother 70       lung- smoker, ovarian  . Hypertension Mother   . Anxiety disorder Mother   . Alcohol abuse Father   . Cancer Sister        ovarian  . Diabetes Sister   . Hypertension Sister   .  Other Maternal Grandmother        pacemaker  . Multiple sclerosis Maternal Grandfather        ?  Christina [name] Asthma Paternal Uncle     ROS: Review of Systems  Constitutional: Positive for weight loss.  Gastrointestinal: Positive for constipation. Negative for blood in stool and melena.    PHYSICAL EXAM: Blood pressure 117/70, pulse 61, temperature 97.8 F (36.6 C), height 5\' 5"  (1.651 m), weight 167 lb (75.8 kg), SpO2 96 %. Body mass index is 27.79 kg/m. Physical Exam  Constitutional: She is oriented to person, place, and time. She appears well-developed and well-nourished.  Cardiovascular: Normal rate.   Pulmonary/Chest: Effort normal.  Musculoskeletal: Normal range of motion.  Neurological: She is oriented to person, place, and time.  Skin: Skin is warm and dry.  Psychiatric: She has a normal mood and affect. Her behavior is normal.  Vitals reviewed.   RECENT LABS AND TESTS: BMET    Component Value Date/Time   NA 142 03/04/2017 1016   K 4.3 03/04/2017 1016   CL 104 03/04/2017 1016   CO2 24 03/04/2017 1016   GLUCOSE 99 03/04/2017 1016   GLUCOSE 94 01/03/2017 1137   BUN 13 03/04/2017 1016   CREATININE 0.75 03/04/2017 1016   CREATININE 0.78 05/20/2015 1704   CALCIUM 10.1 03/04/2017 1016   GFRNONAA 78 03/04/2017 1016   GFRAA 90 03/04/2017 1016   Lab Results  Component Value Date   HGBA1C 5.7 (H) 03/04/2017   HGBA1C 6.0 01/03/2017   HGBA1C 6.0 10/05/2016   HGBA1C 5.8 04/03/2016   HGBA1C 6.0 10/07/2015   Lab Results  Component Value Date   INSULIN 11.1 03/04/2017   CBC    Component Value Date/Time   WBC 5.8 01/03/2017 1137   RBC 4.63 01/03/2017 1137   HGB 14.7 01/03/2017 1137   HCT 43.9 01/03/2017 1137   PLT 246.0 01/03/2017 1137   MCV 94.8 01/03/2017 1137   MCH 31.9 05/20/2015 1704   MCHC 33.4 01/03/2017 1137   RDW 13.8 01/03/2017 1137   Iron/TIBC/Ferritin/ %Sat No results found for: IRON, TIBC, FERRITIN, IRONPCTSAT Lipid Panel     Component Value  Date/Time   CHOL 164 01/03/2017 1137   TRIG 97.0 01/03/2017 1137   HDL 48.80 01/03/2017 1137   CHOLHDL 3 01/03/2017 1137   VLDL 19.4 01/03/2017 1137   LDLCALC 96 01/03/2017 1137   Hepatic Function Panel     Component Value Date/Time   PROT 6.5 03/04/2017 1016   ALBUMIN 4.3 03/04/2017 1016   AST 21 03/04/2017 1016   ALT 15 03/04/2017 1016   ALKPHOS 77 03/04/2017 1016  BILITOT 0.9 03/04/2017 1016   BILIDIR 0.1 05/31/2014 1612   IBILI 0.6 05/26/2013 0921      Component Value Date/Time   TSH 1.89 01/03/2017 1137   TSH 1.51 10/05/2016 1100   TSH 1.92 04/03/2016 0845    ASSESSMENT AND PLAN: Other constipation - Plan: polyethylene glycol powder (GLYCOLAX/MIRALAX) powder  Class 1 obesity with serious comorbidity and body mass index (BMI) of 30.0 to 30.9 in adult, unspecified obesity type - Starting BMI greater than 30  PLAN:  Constipation (new problem) Michaeleen was informed decrease bowel movement frequency is normal while losing weight, but stools should not be hard or painful. She was advised to increase her H20 intake and work on increasing her fiber intake. High fiber foods were discussed today. Kissy agrees to start Miralax 17 grams qd 1 month with no refills and will follow up with our clinic in 2 to 3 weeks.  Obesity Arlayne is currently in the action stage of change. As such, her goal is to continue with weight loss efforts She has agreed to follow the Category 1 plan Kajal has been instructed to work up to a goal of 150 minutes of combined cardio and strengthening exercise per week for weight loss and overall health benefits. We discussed the following Behavioral Modification Strategies today: no skipping meals and increase H2O intake  Tamira has agreed to follow up with our clinic in 2 to 3 weeks. She was informed of the importance of frequent follow up visits to maximize her success with intensive lifestyle modifications for her multiple health  conditions.  I, Doreene Nest, am acting as transcriptionist for Dennard Nip, MD  I have reviewed the above documentation for accuracy and completeness, and I agree with the above. -Dennard Nip, MD    OBESITY BEHAVIORAL INTERVENTION VISIT  Today's visit was # 5 out of 22.  Starting weight: 184 lbs Starting date: 03/04/17 Today's weight : 167 lbs Today's date: 05/02/2017 Total lbs lost to date: 17 (Patients must lose 7 lbs in the first 6 months to continue with counseling)   ASK: We discussed the diagnosis of obesity with [name] today and Earlena agreed to give [place] permission to discuss obesity behavioral modification therapy today.  ASSESS: Carleta has the diagnosis of obesity and her BMI today is 27.79 Ennis is in the action stage of change   ADVISE: Tifanie was educated on the multiple health risks of obesity as well as the benefit of weight loss to improve her health. She was advised of the need for long term treatment and the importance of lifestyle modifications.  AGREE: Multiple dietary modification options and treatment options were discussed and  Gianina agreed to follow the Category 1 plan We discussed the following Behavioral Modification Strategies today: no skipping meals and increase H2O intake

## 2017-05-06 ENCOUNTER — Ambulatory Visit
Admission: RE | Admit: 2017-05-06 | Discharge: 2017-05-06 | Disposition: A | Discharge status: Home / Self Care | Payer: Medicare Other | Source: Ambulatory Visit | Attending: General Surgery | Admitting: General Surgery

## 2017-05-06 DIAGNOSIS — E279 Disorder of adrenal gland, unspecified: Principal | ICD-10-CM

## 2017-05-06 DIAGNOSIS — E278 Other specified disorders of adrenal gland: Secondary | ICD-10-CM

## 2017-05-06 MED ORDER — IOPAMIDOL (ISOVUE-300) INJECTION 61%
100.0000 mL | Freq: Once | INTRAVENOUS | Status: AC | PRN
  Administered 2017-05-06: 100 mL via INTRAVENOUS

## 2017-05-20 ENCOUNTER — Ambulatory Visit (INDEPENDENT_AMBULATORY_CARE_PROVIDER_SITE_OTHER): Payer: Medicare Other | Admitting: Family Medicine

## 2017-05-20 VITALS — BP 146/69 | HR 60 | Temp 98.0°F | Ht 65.0 in | Wt 164.0 lb

## 2017-05-20 DIAGNOSIS — I10 Essential (primary) hypertension: Secondary | ICD-10-CM

## 2017-05-20 DIAGNOSIS — E669 Obesity, unspecified: Secondary | ICD-10-CM

## 2017-05-20 DIAGNOSIS — Z6830 Body mass index (BMI) 30.0-30.9, adult: Secondary | ICD-10-CM

## 2017-05-20 NOTE — Progress Notes (Signed)
Office: [phone]  /  Fax: [phone]   HPI:   Chief Complaint: OBESITY Christina [name] is here to discuss her progress with her obesity treatment plan. She is on the  follow the Category 1 plan and is following her eating plan approximately 80 % of the time. She states she is exercising 0 minutes 0 times per week. Christina [name] continues to do well with weight loss but should be working on maintenance right now. She is not eating all her lean protein lately. Her weight is 164 lb (74.4 kg) today and has had a weight loss of 3 pounds over a period of 2 to 3 weeks since her last visit. She has lost 20 lbs since starting treatment with [place].  Hypertension Christina [name] is a 76 y.o. female with hypertension. Her blood pressure is elevated today. Christina [name] states her blood pressure runs in the 211'H systolic at home on Cozaar. She denies chest pain or headache. She is working weight loss to help control her blood pressure with the goal of decreasing her risk of heart attack and stroke. Christina [name] blood pressure is not currently controlled.   ALLERGIES: Allergies  Allergen Reactions   Nutrasweet Aspartame [Aspartame] Diarrhea   Hydrochlorothiazide Cough   Lisinopril Cough    MEDICATIONS: Current Outpatient Prescriptions on File Prior to Visit  Medication Sig Dispense Refill   fish oil-omega-3 fatty acids 1000 MG capsule Take 1 g by mouth daily.       losartan (COZAAR) 50 MG tablet TAKE 1 TABLET BY MOUTH EVERY DAY 90 tablet 3   magnesium gluconate (MAGONATE) 500 MG tablet Take 500 mg by mouth daily.     polyethylene glycol powder (GLYCOLAX/MIRALAX) powder Take 17 g by mouth daily. 3350 g 0   Probiotic Product (PROBIOTIC-10 PO) Take by mouth.     triamcinolone cream (KENALOG) 0.1 % Apply topically 2 (two) times daily. (Patient taking differently: Apply 1 application topically as needed. ) 80 g 0   Vitamin D, Ergocalciferol, (DRISDOL) 50000 units CAPS capsule Take 1 capsule  (50,000 Units total) by mouth every 7 (seven) days. (Patient not taking: Reported on 05/20/2017) 4 capsule 0   No current facility-administered medications on file prior to visit.     PAST MEDICAL HISTORY: Past Medical History:  Diagnosis Date   Acid reflux    Anxiety    Asthma    a little?   Back pain    Cervical cancer screening 01/31/2012   Chicken pox as a child   Depression with anxiety 06/07/2009   Qualifier: Diagnosis of  By: [name] MD, Catherine     Dermatitis of external ear 07/11/2012   Diarrhea 03/22/2017   Dysphagia 01/31/2012   Heartburn    Hiatal hernia with gastroesophageal reflux 10/26/2010   Qualifier: Diagnosis of  By: [name] MD, Catherine     Hyperglycemia 06/21/2013   Hyperlipidemia, mixed 10/16/2015   Hypertension    Joint pain    Left hip pain 07/10/2015   Lesion of breast 12/03/2016   Low back pain 05/29/2015   Measles as a child   Mumps as a child   Neck pain on left side 06/21/2013   Obesity 10/07/2008   Qualifier: Diagnosis of  By: [name] MD, Otilio Jefferson 01/03/2017   Overweight (BMI 25.0-29.9) 10/07/2008   Qualifier: Diagnosis of  By: [name] MD, Catherine     Palpitations    Prediabetes    Preventative health care 01/31/2012   Routine health maintenance 01/31/2012  RUQ pain 05/29/2015   Sinusitis, acute 07/10/2012   SOB (shortness of breath)    Spinal stenosis    Umbilical hernia    Urinary frequency 09/28/2011   Vertigo     PAST SURGICAL HISTORY: Past Surgical History:  Procedure Laterality Date   BREAST BIOPSY Right 1980   BREAST EXCISIONAL BIOPSY Left 1970   BREAST EXCISIONAL BIOPSY Right    CYSTECTOMY     abdomen   TUBAL LIGATION  1970    SOCIAL HISTORY: Social History  Substance Use Topics   Smoking status: Former Smoker    Packs/day: 1.00    Years: 30.00    Types: Cigarettes    Quit date: 08/27/1990   Smokeless tobacco: Never Used   Alcohol use No    FAMILY  HISTORY: Family History  Problem Relation Age of Onset   Cancer Mother 8       lung- smoker, ovarian   Hypertension Mother    Anxiety disorder Mother    Alcohol abuse Father    Cancer Sister        ovarian   Diabetes Sister    Hypertension Sister    Other Maternal Grandmother        pacemaker   Multiple sclerosis Maternal Grandfather        ?   Asthma Paternal Uncle     ROS: Review of Systems  Constitutional: Positive for weight loss.  Cardiovascular: Negative for chest pain.  Neurological: Negative for headaches.    PHYSICAL EXAM: Blood pressure (!) 146/69, pulse 60, temperature 98 F (36.7 C), temperature source Oral, height 5\' 5"  (1.651 m), weight 164 lb (74.4 kg), SpO2 97 %. Body mass index is 27.29 kg/m. Physical Exam  Constitutional: She is oriented to person, place, and time. She appears well-developed and well-nourished.  Cardiovascular: Normal rate.   Pulmonary/Chest: Effort normal.  Musculoskeletal: Normal range of motion.  Neurological: She is oriented to person, place, and time.  Skin: Skin is warm and dry.  Psychiatric: She has a normal mood and affect. Her behavior is normal.  Vitals reviewed.   RECENT LABS AND TESTS: BMET    Component Value Date/Time   NA 142 03/04/2017 1016   K 4.3 03/04/2017 1016   CL 104 03/04/2017 1016   CO2 24 03/04/2017 1016   GLUCOSE 99 03/04/2017 1016   GLUCOSE 94 01/03/2017 1137   BUN 13 03/04/2017 1016   CREATININE 0.75 03/04/2017 1016   CREATININE 0.78 05/20/2015 1704   CALCIUM 10.1 03/04/2017 1016   GFRNONAA 78 03/04/2017 1016   GFRAA 90 03/04/2017 1016   Lab Results  Component Value Date   HGBA1C 5.7 (H) 03/04/2017   HGBA1C 6.0 01/03/2017   HGBA1C 6.0 10/05/2016   HGBA1C 5.8 04/03/2016   HGBA1C 6.0 10/07/2015   Lab Results  Component Value Date   INSULIN 11.1 03/04/2017   CBC    Component Value Date/Time   WBC 5.8 01/03/2017 1137   RBC 4.63 01/03/2017 1137   HGB 14.7 01/03/2017 1137    HCT 43.9 01/03/2017 1137   PLT 246.0 01/03/2017 1137   MCV 94.8 01/03/2017 1137   MCH 31.9 05/20/2015 1704   MCHC 33.4 01/03/2017 1137   RDW 13.8 01/03/2017 1137   Iron/TIBC/Ferritin/ %Sat No results found for: IRON, TIBC, FERRITIN, IRONPCTSAT Lipid Panel     Component Value Date/Time   CHOL 164 01/03/2017 1137   TRIG 97.0 01/03/2017 1137   HDL 48.80 01/03/2017 1137   CHOLHDL 3 01/03/2017 1137  VLDL 19.4 01/03/2017 1137   LDLCALC 96 01/03/2017 1137   Hepatic Function Panel     Component Value Date/Time   PROT 6.5 03/04/2017 1016   ALBUMIN 4.3 03/04/2017 1016   AST 21 03/04/2017 1016   ALT 15 03/04/2017 1016   ALKPHOS 77 03/04/2017 1016   BILITOT 0.9 03/04/2017 1016   BILIDIR 0.1 05/31/2014 1612   IBILI 0.6 05/26/2013 0921      Component Value Date/Time   TSH 1.89 01/03/2017 1137   TSH 1.51 10/05/2016 1100   TSH 1.92 04/03/2016 0845    ASSESSMENT AND PLAN: Essential hypertension  Class 1 obesity with serious comorbidity and body mass index (BMI) of 30.0 to 30.9 in adult, unspecified obesity type - Starting BMI greater than 30  PLAN:  Hypertension We discussed sodium restriction, working on healthy weight loss, and a regular exercise program as the means to achieve improved blood pressure control. We will continue to monitor her blood pressure as well as her progress with the above lifestyle modifications. She will continue her medications as prescribed and will watch for signs of hypotension as she continues her lifestyle modifications. Christina [name] is to bring her blood pressure suff to her next visit and we will compare result. Christina [name] will continue with diet and exercise in the meanwhile.  We spent > than 50% of the 15 minute visit on the counseling as documented in the note.   Obesity Christina [name] is currently in the action stage of change. As such, her goal is to continue with weight loss efforts She has agreed to follow the Category 1 plan Christina [name] has been  instructed to work up to a goal of 150 minutes of combined cardio and strengthening exercise per week or start strengthening exercise -try [employer] to start, like chair yoga for weight loss and overall health benefits. We discussed the following Behavioral Modification Strategies today: increase H2O intake, increasing lean protein intake and decreasing sodium intake  Karris has agreed to follow up with our clinic in 8 weeks. She was informed of the importance of frequent follow up visits to maximize her success with intensive lifestyle modifications for her multiple health conditions.  I, Doreene Nest, am acting as transcriptionist for Dennard Nip, MD  I have reviewed the above documentation for accuracy and completeness, and I agree with the above. -Dennard Nip, MD   OBESITY BEHAVIORAL INTERVENTION VISIT  Today's visit was # 6 out of 22.  Starting weight: 184 lbs Starting date: 03/04/17 Today's weight : 164 lbs  Today's date: 05/20/2017 Total lbs lost to date: 20 (Patients must lose 7 lbs in the first 6 months to continue with counseling)   ASK: We discussed the diagnosis of obesity with Christina [name] today and Christina [name] agreed to give [place] permission to discuss obesity behavioral modification therapy today.  ASSESS: Christina [name] has the diagnosis of obesity and her BMI today is 27.29 Christina [name] is in the action stage of change   ADVISE: Christina [name] was educated on the multiple health risks of obesity as well as the benefit of weight loss to improve her health. She was advised of the need for long term treatment and the importance of lifestyle modifications.  AGREE: Multiple dietary modification options and treatment options were discussed and  Christina [name] agreed to follow the Category 1 plan We discussed the following Behavioral Modification Strategies today: increase H2O intake, increasing lean protein intake and decreasing sodium intake

## 2017-05-22 ENCOUNTER — Other Ambulatory Visit (INDEPENDENT_AMBULATORY_CARE_PROVIDER_SITE_OTHER): Payer: Self-pay

## 2017-05-22 ENCOUNTER — Encounter (INDEPENDENT_AMBULATORY_CARE_PROVIDER_SITE_OTHER): Payer: Self-pay | Admitting: Family Medicine

## 2017-05-22 DIAGNOSIS — E559 Vitamin D deficiency, unspecified: Secondary | ICD-10-CM

## 2017-05-22 MED ORDER — VITAMIN D (ERGOCALCIFEROL) 1.25 MG (50000 UNIT) PO CAPS: 50000.0000 [IU] | ORAL_CAPSULE | ORAL | 0 refills | Status: DC

## 2017-05-31 NOTE — Pre-Procedure Instructions (Addendum)
Christina [name]  05/31/2017      [id number] - [place], [place] - [address] [place] [address] Phone: [phone] Fax: [phone]    Your procedure is scheduled on Tues. Oct. 16  Report to [facility] at 5;30 A.M.  Call this number if you have problems the morning of surgery:  [phone]   Remember:  Do not eat food or drink liquids after midnight on Wed. Oct. 15   Take these medicines the morning of surgery with A SIP OF WATER : none              ( DRINK BOOST BREEZE AT 3:30 AM)              7 days prior to surgery STOP taking any Aspirin (unless otherwise instructed by your surgeon), Aleve, Naproxen, Ibuprofen, Motrin, Advil, Goody's, BC's, all herbal medications, fish oil, and all vitamins   Do not wear jewelry, make-up or nail polish.  Do not wear lotions, powders, or perfumes, or deoderant.  Do not shave 48 hours prior to surgery.  Men may shave face and neck.  Do not bring valuables to the hospital.  [facility] is not responsible for any belongings or valuables.  Contacts, dentures or bridgework may not be worn into surgery.  Leave your suitcase in the car.  After surgery it may be brought to your room.  For patients admitted to the hospital, discharge time will be determined by your treatment team.  Patients discharged the day of surgery will not be allowed to drive home.    Special instructions:  - Preparing For Surgery  Before surgery, you can play an important role. Because skin is not sterile, your skin needs to be as free of germs as possible. You can reduce the number of germs on your skin by washing with CHG (chlorahexidine gluconate) Soap before surgery.  CHG is an antiseptic cleaner which kills germs and bonds with the skin to continue killing germs even after washing.  Please do not use if you have an allergy to CHG or antibacterial soaps. If your skin becomes  reddened/irritated stop using the CHG.  Do not shave (including legs and underarms) for at least 48 hours prior to first CHG shower. It is OK to shave your face.  Please follow these instructions carefully.   1. Shower the NIGHT BEFORE SURGERY and the MORNING OF SURGERY with CHG.   2. If you chose to wash your hair, wash your hair first as usual with your normal shampoo.  3. After you shampoo, rinse your hair and body thoroughly to remove the shampoo.  4. Use CHG as you would any other liquid soap. You can apply CHG directly to the skin and wash gently with a scrungie or a clean washcloth.   5. Apply the CHG Soap to your body ONLY FROM THE NECK DOWN.  Do not use on open wounds or open sores. Avoid contact with your eyes, ears, mouth and genitals (private parts). Wash genitals (private parts) with your normal soap.  USE REGULAR SHAMPOO AND CONDITIONER FOR HAIR USE REGULAR SOAP FOR FACE AND PRIVATE AREA  6. Wash thoroughly, paying special attention to the area where your surgery will be performed.  7. Thoroughly rinse your body with warm water from the neck down.  8. DO NOT shower/wash with your normal soap after using and rinsing off the CHG Soap.  9. Pat yourself dry with a CLEAN TOWEL and [place] CLOTH  10. Wear CLEAN PAJAMAS to bed the night before surgery, wear comfortable clothes the morning of surgery  11. Place CLEAN SHEETS on your bed the night of your first shower and DO NOT SLEEP WITH PETS.    Day of Surgery: Do not apply any deodorants/lotions. Please wear clean clothes to the hospital/surgery center.      Please read over the following fact sheets that you were given. Coughing and Deep Breathing and Surgical Site Infection Prevention

## 2017-06-03 ENCOUNTER — Encounter (HOSPITAL_COMMUNITY)
Admission: RE | Admit: 2017-06-03 | Discharge: 2017-06-03 | Disposition: A | Discharge status: Home / Self Care | Payer: Medicare Other | Source: Ambulatory Visit | Attending: General Surgery | Admitting: General Surgery

## 2017-06-03 ENCOUNTER — Encounter (HOSPITAL_COMMUNITY): Payer: Self-pay

## 2017-06-03 DIAGNOSIS — Z01812 Encounter for preprocedural laboratory examination: Secondary | ICD-10-CM | POA: Diagnosis present

## 2017-06-03 DIAGNOSIS — E669 Obesity, unspecified: Secondary | ICD-10-CM | POA: Insufficient documentation

## 2017-06-03 DIAGNOSIS — Z87891 Personal history of nicotine dependence: Secondary | ICD-10-CM | POA: Insufficient documentation

## 2017-06-03 DIAGNOSIS — Z6827 Body mass index (BMI) 27.0-27.9, adult: Secondary | ICD-10-CM | POA: Diagnosis not present

## 2017-06-03 DIAGNOSIS — I10 Essential (primary) hypertension: Secondary | ICD-10-CM | POA: Diagnosis not present

## 2017-06-03 HISTORY — DX: Unspecified osteoarthritis, unspecified site: M19.90

## 2017-06-03 LAB — BASIC METABOLIC PANEL
Anion gap: 9 (ref 5–15)
BUN: 12 mg/dL (ref 6–20)
CO2: 26 mmol/L (ref 22–32)
Calcium: 10.1 mg/dL (ref 8.9–10.3)
Chloride: 105 mmol/L (ref 101–111)
Creatinine, Ser: 0.76 mg/dL (ref 0.44–1.00)
GFR calc Af Amer: >60 mL/min (ref >60)
GFR calc non Af Amer: >60 mL/min (ref >60)
Glucose, Bld: 101 mg/dL — ABNORMAL HIGH (ref 65–99)
Potassium: 3.9 mmol/L (ref 3.5–5.1)
Sodium: 140 mmol/L (ref 135–145)

## 2017-06-03 LAB — CBC
HCT: 43.1 % (ref 36.0–46.0)
Hemoglobin: 14 g/dL (ref 12.0–15.0)
MCH: 31.2 pg (ref 26.0–34.0)
MCHC: 32.5 g/dL (ref 30.0–36.0)
MCV: 96 fL (ref 78.0–100.0)
Platelets: 223 10*3/uL (ref 150–400)
RBC: 4.49 MIL/uL (ref 3.87–5.11)
RDW: 14.3 % (ref 11.5–15.5)
WBC: 9.2 10*3/uL (ref 4.0–10.5)

## 2017-06-03 NOTE — Progress Notes (Signed)
PCP Dr. Penni Homans   No Cardiologist Denies any chest pain or discomfort

## 2017-06-10 NOTE — Anesthesia Preprocedure Evaluation (Addendum)
Anesthesia Evaluation  Patient identified by MRN, date of birth, ID band Patient awake    Reviewed: Allergy & Precautions, NPO status , Patient's Chart, lab work & pertinent test results  Airway Mallampati: II  TM Distance: >3 FB Neck ROM: Full    Dental  (+) Dental Advisory Given   Pulmonary asthma , former smoker,    breath sounds clear to auscultation       Cardiovascular hypertension, Pt. on medications + Peripheral Vascular Disease   Rhythm:Regular Rate:Normal     Neuro/Psych negative neurological ROS     GI/Hepatic Neg liver ROS, GERD  ,  Endo/Other  negative endocrine ROS  Renal/GU negative Renal ROS     Musculoskeletal  (+) Arthritis ,   Abdominal   Peds  Hematology negative hematology ROS (+)   Anesthesia Other Findings   Reproductive/Obstetrics                            Lab Results  Component Value Date   WBC 9.2 06/03/2017   HGB 14.0 06/03/2017   HCT 43.1 06/03/2017   MCV 96.0 06/03/2017   PLT 223 06/03/2017   Lab Results  Component Value Date   CREATININE 0.76 06/03/2017   BUN 12 06/03/2017   NA 140 06/03/2017   K 3.9 06/03/2017   CL 105 06/03/2017   CO2 26 06/03/2017    Anesthesia Physical Anesthesia Plan  ASA: II  Anesthesia Plan: General   Post-op Pain Management:  Regional for Post-op pain   Induction: Intravenous  PONV Risk Score and Plan: 4 or greater and Ondansetron, Dexamethasone and Treatment may vary due to age or medical condition  Airway Management Planned: Oral ETT  Additional Equipment:   Intra-op Plan:   Post-operative Plan: Extubation in OR  Informed Consent: I have reviewed the patients History and Physical, chart, labs and discussed the procedure including the risks, benefits and alternatives for the proposed anesthesia with the patient or authorized representative who has indicated his/her understanding and acceptance.   Dental  advisory given  Plan Discussed with: CRNA  Anesthesia Plan Comments:        Anesthesia Quick Evaluation

## 2017-06-11 ENCOUNTER — Ambulatory Visit (HOSPITAL_COMMUNITY): Payer: Medicare Other | Admitting: Anesthesiology

## 2017-06-11 ENCOUNTER — Encounter (HOSPITAL_COMMUNITY): Payer: Self-pay | Admitting: *Deleted

## 2017-06-11 ENCOUNTER — Observation Stay (HOSPITAL_COMMUNITY)
Admission: RE | Admit: 2017-06-11 | Discharge: 2017-06-13 | Disposition: A | Discharge status: Home / Self Care | Payer: Medicare Other | Source: Ambulatory Visit | Attending: General Surgery | Admitting: General Surgery

## 2017-06-11 ENCOUNTER — Encounter (HOSPITAL_COMMUNITY)
Admission: RE | Disposition: A | Discharge status: Home / Self Care | Payer: Self-pay | Source: Ambulatory Visit | Attending: General Surgery

## 2017-06-11 DIAGNOSIS — Z87891 Personal history of nicotine dependence: Secondary | ICD-10-CM | POA: Insufficient documentation

## 2017-06-11 DIAGNOSIS — Z91018 Allergy to other foods: Secondary | ICD-10-CM | POA: Diagnosis not present

## 2017-06-11 DIAGNOSIS — M6208 Separation of muscle (nontraumatic), other site: Secondary | ICD-10-CM | POA: Insufficient documentation

## 2017-06-11 DIAGNOSIS — K219 Gastro-esophageal reflux disease without esophagitis: Secondary | ICD-10-CM | POA: Insufficient documentation

## 2017-06-11 DIAGNOSIS — I10 Essential (primary) hypertension: Secondary | ICD-10-CM | POA: Insufficient documentation

## 2017-06-11 DIAGNOSIS — K420 Umbilical hernia with obstruction, without gangrene: Secondary | ICD-10-CM | POA: Diagnosis not present

## 2017-06-11 DIAGNOSIS — J45909 Unspecified asthma, uncomplicated: Secondary | ICD-10-CM | POA: Diagnosis not present

## 2017-06-11 DIAGNOSIS — I739 Peripheral vascular disease, unspecified: Secondary | ICD-10-CM | POA: Diagnosis not present

## 2017-06-11 DIAGNOSIS — K439 Ventral hernia without obstruction or gangrene: Secondary | ICD-10-CM | POA: Diagnosis present

## 2017-06-11 DIAGNOSIS — Z888 Allergy status to other drugs, medicaments and biological substances status: Secondary | ICD-10-CM | POA: Insufficient documentation

## 2017-06-11 DIAGNOSIS — Z79899 Other long term (current) drug therapy: Secondary | ICD-10-CM | POA: Insufficient documentation

## 2017-06-11 DIAGNOSIS — K432 Incisional hernia without obstruction or gangrene: Secondary | ICD-10-CM | POA: Diagnosis present

## 2017-06-11 DIAGNOSIS — K430 Incisional hernia with obstruction, without gangrene: Secondary | ICD-10-CM | POA: Diagnosis not present

## 2017-06-11 HISTORY — PX: INSERTION OF MESH: SHX5868

## 2017-06-11 HISTORY — PX: LAPAROSCOPIC ASSISTED VENTRAL HERNIA REPAIR: SHX6312

## 2017-06-11 HISTORY — PX: VENTRAL HERNIA REPAIR: SHX424

## 2017-06-11 SURGERY — REPAIR, HERNIA, VENTRAL, LAPAROSCOPIC
Anesthesia: General | Site: Abdomen

## 2017-06-11 MED ORDER — SIMETHICONE 80 MG PO CHEW: 40.0000 mg | CHEWABLE_TABLET | Freq: Four times a day (QID) | ORAL | Status: DC | PRN

## 2017-06-11 MED ORDER — CEFAZOLIN SODIUM-DEXTROSE 2-4 GM/100ML-% IV SOLN
INTRAVENOUS | Status: AC
  Filled 2017-06-11: qty 100

## 2017-06-11 MED ORDER — METHOCARBAMOL 500 MG PO TABS
500.0000 mg | ORAL_TABLET | Freq: Four times a day (QID) | ORAL | Status: DC | PRN
  Administered 2017-06-11 – 2017-06-12 (×3): 500 mg via ORAL
  Filled 2017-06-11 (×3): qty 1

## 2017-06-11 MED ORDER — HYDROMORPHONE HCL 1 MG/ML IJ SOLN
INTRAMUSCULAR | Status: AC
  Filled 2017-06-11: qty 1

## 2017-06-11 MED ORDER — SODIUM CHLORIDE 0.9 % IV SOLN
INTRAVENOUS | Status: DC
  Administered 2017-06-11: 14:00:00 via INTRAVENOUS

## 2017-06-11 MED ORDER — MORPHINE SULFATE (PF) 4 MG/ML IV SOLN
2.0000 mg | INTRAVENOUS | Status: DC | PRN
  Administered 2017-06-11 – 2017-06-12 (×4): 2 mg via INTRAVENOUS
  Filled 2017-06-11 (×5): qty 1

## 2017-06-11 MED ORDER — POLYETHYLENE GLYCOL 3350 17 G PO PACK: 17.0000 g | PACK | Freq: Every day | ORAL | Status: DC | PRN

## 2017-06-11 MED ORDER — ACETAMINOPHEN 500 MG PO TABS
ORAL_TABLET | ORAL | Status: AC
  Administered 2017-06-11: 1000 mg via ORAL
  Filled 2017-06-11: qty 2

## 2017-06-11 MED ORDER — FENTANYL CITRATE (PF) 100 MCG/2ML IJ SOLN
INTRAMUSCULAR | Status: DC | PRN
  Administered 2017-06-11: 50 ug via INTRAVENOUS
  Administered 2017-06-11 (×2): 100 ug via INTRAVENOUS
  Administered 2017-06-11 (×5): 50 ug via INTRAVENOUS

## 2017-06-11 MED ORDER — FENTANYL CITRATE (PF) 250 MCG/5ML IJ SOLN
INTRAMUSCULAR | Status: AC
  Filled 2017-06-11: qty 5

## 2017-06-11 MED ORDER — FENTANYL CITRATE (PF) 100 MCG/2ML IJ SOLN: 25.0000 ug | INTRAMUSCULAR | Status: DC | PRN

## 2017-06-11 MED ORDER — OXYCODONE HCL 5 MG PO TABS: 5.0000 mg | ORAL_TABLET | Freq: Four times a day (QID) | ORAL | 0 refills | Status: DC | PRN

## 2017-06-11 MED ORDER — MIDAZOLAM HCL 2 MG/2ML IJ SOLN
INTRAMUSCULAR | Status: AC
  Filled 2017-06-11: qty 2

## 2017-06-11 MED ORDER — PHENYLEPHRINE HCL 10 MG/ML IJ SOLN
INTRAVENOUS | Status: DC | PRN
  Administered 2017-06-11: 20 ug/min via INTRAVENOUS

## 2017-06-11 MED ORDER — METHOCARBAMOL 500 MG PO TABS
ORAL_TABLET | ORAL | Status: AC
  Filled 2017-06-11: qty 1

## 2017-06-11 MED ORDER — PHENYLEPHRINE 40 MCG/ML (10ML) SYRINGE FOR IV PUSH (FOR BLOOD PRESSURE SUPPORT)
PREFILLED_SYRINGE | INTRAVENOUS | Status: AC
  Filled 2017-06-11: qty 10

## 2017-06-11 MED ORDER — OXYCODONE HCL 5 MG PO TABS
5.0000 mg | ORAL_TABLET | ORAL | Status: DC | PRN
  Administered 2017-06-11 – 2017-06-13 (×9): 5 mg via ORAL
  Filled 2017-06-11 (×8): qty 1

## 2017-06-11 MED ORDER — SUGAMMADEX SODIUM 200 MG/2ML IV SOLN
INTRAVENOUS | Status: AC
  Filled 2017-06-11: qty 2

## 2017-06-11 MED ORDER — ROCURONIUM BROMIDE 50 MG/5ML IV SOLN
INTRAVENOUS | Status: AC
  Filled 2017-06-11: qty 1

## 2017-06-11 MED ORDER — LOSARTAN POTASSIUM 50 MG PO TABS
50.0000 mg | ORAL_TABLET | Freq: Every day | ORAL | Status: DC
  Administered 2017-06-12 – 2017-06-13 (×2): 50 mg via ORAL
  Filled 2017-06-11 (×2): qty 1

## 2017-06-11 MED ORDER — GABAPENTIN 300 MG PO CAPS
300.0000 mg | ORAL_CAPSULE | ORAL | Status: AC
  Administered 2017-06-11: 300 mg via ORAL

## 2017-06-11 MED ORDER — SUGAMMADEX SODIUM 200 MG/2ML IV SOLN
INTRAVENOUS | Status: DC | PRN
  Administered 2017-06-11: 150 mg via INTRAVENOUS

## 2017-06-11 MED ORDER — CEFAZOLIN SODIUM-DEXTROSE 2-4 GM/100ML-% IV SOLN
2.0000 g | INTRAVENOUS | Status: AC
  Administered 2017-06-11: 2 g via INTRAVENOUS

## 2017-06-11 MED ORDER — BUPIVACAINE-EPINEPHRINE (PF) 0.25% -1:200000 IJ SOLN
INTRAMUSCULAR | Status: AC
  Filled 2017-06-11: qty 30

## 2017-06-11 MED ORDER — ROCURONIUM BROMIDE 100 MG/10ML IV SOLN
INTRAVENOUS | Status: DC | PRN
  Administered 2017-06-11: 40 mg via INTRAVENOUS
  Administered 2017-06-11: 10 mg via INTRAVENOUS

## 2017-06-11 MED ORDER — ONDANSETRON HCL 4 MG/2ML IJ SOLN
INTRAMUSCULAR | Status: DC | PRN
  Administered 2017-06-11: 4 mg via INTRAVENOUS

## 2017-06-11 MED ORDER — LACTATED RINGERS IV SOLN
INTRAVENOUS | Status: DC | PRN
  Administered 2017-06-11 (×2): via INTRAVENOUS

## 2017-06-11 MED ORDER — LIDOCAINE 2% (20 MG/ML) 5 ML SYRINGE
INTRAMUSCULAR | Status: AC
  Filled 2017-06-11: qty 5

## 2017-06-11 MED ORDER — 0.9 % SODIUM CHLORIDE (POUR BTL) OPTIME
TOPICAL | Status: DC | PRN
  Administered 2017-06-11: 1000 mL

## 2017-06-11 MED ORDER — ACETAMINOPHEN 500 MG PO TABS
1000.0000 mg | ORAL_TABLET | ORAL | Status: AC
  Administered 2017-06-11: 1000 mg via ORAL

## 2017-06-11 MED ORDER — PROPOFOL 10 MG/ML IV BOLUS
INTRAVENOUS | Status: DC | PRN
  Administered 2017-06-11: 150 mg via INTRAVENOUS
  Administered 2017-06-11 (×2): 10 mg via INTRAVENOUS

## 2017-06-11 MED ORDER — ONDANSETRON HCL 4 MG/2ML IJ SOLN
INTRAMUSCULAR | Status: AC
  Filled 2017-06-11: qty 2

## 2017-06-11 MED ORDER — SUCCINYLCHOLINE CHLORIDE 200 MG/10ML IV SOSY
PREFILLED_SYRINGE | INTRAVENOUS | Status: AC
  Filled 2017-06-11: qty 10

## 2017-06-11 MED ORDER — EPHEDRINE 5 MG/ML INJ
INTRAVENOUS | Status: AC
  Filled 2017-06-11: qty 10

## 2017-06-11 MED ORDER — PROCHLORPERAZINE MALEATE 10 MG PO TABS
10.0000 mg | ORAL_TABLET | Freq: Four times a day (QID) | ORAL | Status: DC | PRN
  Filled 2017-06-11: qty 1

## 2017-06-11 MED ORDER — PROCHLORPERAZINE EDISYLATE 5 MG/ML IJ SOLN
5.0000 mg | Freq: Four times a day (QID) | INTRAMUSCULAR | Status: DC | PRN
  Administered 2017-06-12: 10 mg via INTRAVENOUS
  Filled 2017-06-11: qty 2

## 2017-06-11 MED ORDER — HYDROMORPHONE HCL 1 MG/ML IJ SOLN
0.2500 mg | INTRAMUSCULAR | Status: AC | PRN
  Administered 2017-06-11: 0.25 mg via INTRAVENOUS
  Administered 2017-06-11: 0.5 mg via INTRAVENOUS
  Administered 2017-06-11: 0.25 mg via INTRAVENOUS
  Administered 2017-06-11: 0.5 mg via INTRAVENOUS

## 2017-06-11 MED ORDER — ENOXAPARIN SODIUM 40 MG/0.4ML ~~LOC~~ SOLN
40.0000 mg | SUBCUTANEOUS | Status: DC
  Administered 2017-06-11 – 2017-06-12 (×2): 40 mg via SUBCUTANEOUS
  Filled 2017-06-11 (×2): qty 0.4

## 2017-06-11 MED ORDER — DEXAMETHASONE SODIUM PHOSPHATE 10 MG/ML IJ SOLN
INTRAMUSCULAR | Status: AC
  Filled 2017-06-11: qty 1

## 2017-06-11 MED ORDER — BUPIVACAINE-EPINEPHRINE 0.25% -1:200000 IJ SOLN
INTRAMUSCULAR | Status: DC | PRN
  Administered 2017-06-11: 6 mL

## 2017-06-11 MED ORDER — PROPOFOL 10 MG/ML IV BOLUS
INTRAVENOUS | Status: AC
  Filled 2017-06-11: qty 20

## 2017-06-11 MED ORDER — OXYCODONE HCL 5 MG PO TABS
ORAL_TABLET | ORAL | Status: AC
  Filled 2017-06-11: qty 1

## 2017-06-11 MED ORDER — PROMETHAZINE HCL 25 MG/ML IJ SOLN: 6.2500 mg | INTRAMUSCULAR | Status: DC | PRN

## 2017-06-11 MED ORDER — ACETAMINOPHEN 500 MG PO TABS
1000.0000 mg | ORAL_TABLET | Freq: Four times a day (QID) | ORAL | Status: DC
  Administered 2017-06-11 – 2017-06-13 (×7): 1000 mg via ORAL
  Filled 2017-06-11 (×7): qty 2

## 2017-06-11 MED ORDER — GABAPENTIN 300 MG PO CAPS
ORAL_CAPSULE | ORAL | Status: AC
  Administered 2017-06-11: 300 mg via ORAL
  Filled 2017-06-11: qty 1

## 2017-06-11 MED ORDER — MIDAZOLAM HCL 5 MG/5ML IJ SOLN
INTRAMUSCULAR | Status: DC | PRN
  Administered 2017-06-11 (×2): 1 mg via INTRAVENOUS

## 2017-06-11 MED ORDER — BUPIVACAINE-EPINEPHRINE (PF) 0.25% -1:200000 IJ SOLN
INTRAMUSCULAR | Status: DC | PRN
  Administered 2017-06-11 (×2): 25 mL

## 2017-06-11 SURGICAL SUPPLY — 45 items
ADH SKN CLS APL DERMABOND .7 (GAUZE/BANDAGES/DRESSINGS) ×1
CHLORAPREP W/TINT 26ML (MISCELLANEOUS) ×2 IMPLANT
COVER SURGICAL LIGHT HANDLE (MISCELLANEOUS) ×2 IMPLANT
DERMABOND ADVANCED (GAUZE/BANDAGES/DRESSINGS) ×1
DERMABOND ADVANCED .7 DNX12 (GAUZE/BANDAGES/DRESSINGS) ×1 IMPLANT
DEVICE RELIATACK FIXATION (MISCELLANEOUS) ×2 IMPLANT
DEVICE TROCAR PUNCTURE CLOSURE (ENDOMECHANICALS) ×2 IMPLANT
DRAPE INCISE IOBAN 66X45 STRL (DRAPES) ×2 IMPLANT
ELECT REM PT RETURN 9FT ADLT (ELECTROSURGICAL) ×2
ELECTRODE REM PT RTRN 9FT ADLT (ELECTROSURGICAL) ×1 IMPLANT
GLOVE BIO SURGEON STRL SZ7 (GLOVE) ×3 IMPLANT
GLOVE BIOGEL PI IND STRL 6 (GLOVE) IMPLANT
GLOVE BIOGEL PI IND STRL 6.5 (GLOVE) IMPLANT
GLOVE BIOGEL PI IND STRL 7.0 (GLOVE) IMPLANT
GLOVE BIOGEL PI IND STRL 7.5 (GLOVE) ×1 IMPLANT
GLOVE BIOGEL PI INDICATOR 6 (GLOVE) ×2
GLOVE BIOGEL PI INDICATOR 6.5 (GLOVE) ×2
GLOVE BIOGEL PI INDICATOR 7.0 (GLOVE) ×2
GLOVE BIOGEL PI INDICATOR 7.5 (GLOVE) ×3
GLOVE ECLIPSE 6.5 STRL STRAW (GLOVE) ×2 IMPLANT
GLOVE ECLIPSE 7.5 STRL STRAW (GLOVE) ×1 IMPLANT
GLOVE SURG SS PI 6.5 STRL IVOR (GLOVE) ×1 IMPLANT
GOWN STRL REUS W/ TWL LRG LVL3 (GOWN DISPOSABLE) ×3 IMPLANT
GOWN STRL REUS W/TWL LRG LVL3 (GOWN DISPOSABLE) ×14
KIT BASIN OR (CUSTOM PROCEDURE TRAY) ×2 IMPLANT
KIT ROOM TURNOVER OR (KITS) ×2 IMPLANT
MARKER SKIN DUAL TIP RULER LAB (MISCELLANEOUS) ×2 IMPLANT
MESH VENTRALIGHT ST 7X9N (Mesh General) ×1 IMPLANT
NDL SPNL 22GX3.5 QUINCKE BK (NEEDLE) ×1 IMPLANT
NEEDLE SPNL 22GX3.5 QUINCKE BK (NEEDLE) ×2 IMPLANT
NS IRRIG 1000ML POUR BTL (IV SOLUTION) ×2 IMPLANT
PAD ARMBOARD 7.5X6 YLW CONV (MISCELLANEOUS) ×4 IMPLANT
RELOAD ENDO RELIATCK 10 HERNIA (MISCELLANEOUS) IMPLANT
RELOAD RELIATACK 10 (MISCELLANEOUS) ×6 IMPLANT
SLEEVE ENDOPATH XCEL 5M (ENDOMECHANICALS) ×5 IMPLANT
STRIP CLOSURE SKIN 1/2X4 (GAUZE/BANDAGES/DRESSINGS) ×2 IMPLANT
SUT MNCRL AB 4-0 PS2 18 (SUTURE) ×2 IMPLANT
SUT PROLENE 0 CT 1 CR/8 (SUTURE) ×2 IMPLANT
SUT VIC AB 0 CT1 27 (SUTURE) ×8
SUT VIC AB 0 CT1 27XBRD ANBCTR (SUTURE) IMPLANT
TOWEL OR 17X24 6PK STRL BLUE (TOWEL DISPOSABLE) ×2 IMPLANT
TRAY FOLEY CATH SILVER 16FR (SET/KITS/TRAYS/PACK) ×2 IMPLANT
TRAY LAPAROSCOPIC MC (CUSTOM PROCEDURE TRAY) ×2 IMPLANT
TROCAR XCEL NON-BLD 5MMX100MML (ENDOMECHANICALS) ×2 IMPLANT
TUBING INSUFFLATION (TUBING) ×2 IMPLANT

## 2017-06-11 NOTE — Op Note (Signed)
Preoperative diagnosis: ventral hernia times two Postoperative diagnosis: same as above Procedure Laparoscopic ventral hernia repair (epigastric and umbilical) with 49F02 cm ventralight st mesh Surgeon: Dr Serita Grammes  Anesthesia: general with bilateral TAP block EBL minimal Complications none Drains none Specimens none Sponge and needle count correct times two dispo to recovery stable.  Indications: this is a 33 yof with symptomatic epigastric and umbilical hernias. She has prior left lateral incision with what appears to be denervated abdominal wall as well.  We discussed options and have elected to proceed with laparoscopic repair of ventral hernias with mesh.  Procedure: After informed consent was obtained patient underwent bilateral TAP blocks by anesthesia.  She was given ancef.  She had SCDs in place.  She was placed under general anesthesia without complication.  She had a foley and orogastric tube placed.  She was prepped and draped in standard sterile surgical fashion. ioban was placed over the surgical field.   A timeout was performed.  I infiltrated marcaine in the left upper quadrant. I made an incision. I inserted a 5 mm Optiview trocar without evidence of entry injury. I insufflated the abdomen to 15 mm Hg pressure.  I then inserted two further 5 mm trocars in the left side of the abdomen.  She had a small umbilical hernia then a diastasis to an epigastric hernia. I took the falciform ligament down with cautery.  I then reduced about 4x5 cm of preperitoneal fat incarcerated in this hernia and removed via a port site.  I elected to cover the area between the two hernias as well as the hernias with a single piece of mesh.  The left lower quadrant just appears to have residual from prior surgery in terms of weakness but no hernia as seen on ct and her exam preop. I then chose a 17x22 cm ventralight st mesh. I placed four 0 vicryl sutures in the cardinal positions around the mesh. I  then rolled the mesh and inserted it into abdomen. I laid this flat and gave at least 5 cm of overlap in all directions. I used the endoclose device to pull the sutures up and tie them down.  I then used the reliatack device to secure the mesh.  From there I placed two additional five mm trocars in the right abdomen. I used the tacker to secure this also.  This appeared in good position and gave good coverage.  Hemostasis was observed. There was no bowel injury.  I then removed the trocars and desufflated the abdomen.  Incisions were closed with 4-0 monocryl and glue.  steristrips were applied. She tolerated well, was extubated and transferred to recovery stable.

## 2017-06-11 NOTE — Interval H&P Note (Signed)
History and Physical Interval Note:  06/11/2017 7:16 AM  [name]  has presented today for surgery, with the diagnosis of ventral hernia x2  The various methods of treatment have been discussed with the patient and family. After consideration of risks, benefits and other options for treatment, the patient has consented to  Procedure(s) with comments: [name] (N/A) - BILATERAL TAP BLOCK INSERTION OF MESH (N/A) - BILATERAL TAP BLOCK as a surgical intervention .  The patient's history has been reviewed, patient examined, no change in status, stable for surgery.  I have reviewed the patient's chart and labs.  Questions were answered to the patient's satisfaction.     Christina [name],Christina [name]

## 2017-06-11 NOTE — Anesthesia Postprocedure Evaluation (Signed)
Anesthesia Post Note  Patient: Christina [name]  Procedure(s) Performed: LAPAROSCOPIC VENTRAL HERNIA REPAIR WITH MESH ERAS PATHWAY (N/A Abdomen) INSERTION OF MESH (N/A Abdomen)     Patient location during evaluation: PACU Anesthesia Type: General Level of consciousness: awake and alert Pain management: pain level controlled Vital Signs Assessment: post-procedure vital signs reviewed and stable Respiratory status: spontaneous breathing, nonlabored ventilation, respiratory function stable and patient connected to nasal cannula oxygen Cardiovascular status: blood pressure returned to baseline and stable Postop Assessment: no apparent nausea or vomiting Anesthetic complications: no    Last Vitals:  Vitals:   06/11/17 1100 06/11/17 1130  BP: (!) 142/74   Pulse: 65 64  Resp: (!) 9 (!) 9  Temp:    SpO2: 98% 96%    Last Pain:  Vitals:   06/11/17 1100  PainSc: Asleep                 Tiajuana Amass

## 2017-06-11 NOTE — Anesthesia Procedure Notes (Signed)
Procedure Name: Intubation Date/Time: 06/11/2017 7:42 AM Performed by: Izora Gala Pre-anesthesia Checklist: Patient identified, Emergency Drugs available, Suction available and Patient being monitored Patient Re-evaluated:Patient Re-evaluated prior to induction Oxygen Delivery Method: Circle system utilized Preoxygenation: Pre-oxygenation with 100% oxygen Induction Type: IV induction Ventilation: Mask ventilation without difficulty Laryngoscope Size: Miller and 3 Grade View: Grade II Tube type: Oral Tube size: 7.0 mm Number of attempts: 1 Placement Confirmation: ETT inserted through vocal cords under direct vision,  positive ETCO2 and breath sounds checked- equal and bilateral

## 2017-06-11 NOTE — H&P (Signed)
49 yof who I saw last year for ventral hernia she had smaller umbo hernia and possible epigastric hernia vs diastasis. I sent her to get ct scan to discuss possible repair. the scan shows small umbilical hernia containing peritoneal fat and a small midline hernia 5.7 cm above that containing fat. the upper hernia is now more symptomatic but always reduces. the lower one is as well. she has more discomfort and feels like it is bigger. she has no more breast issues now  Past Surgical History  Breast Biopsy  Bilateral. Cataract Surgery  Bilateral. Oral Surgery   Allergies  Lisinopril *ANTIHYPERTENSIVES*  HydroCHLOROthiazide *DIURETICS*  Allergies Reconciled   Medication History  ALPRAZolam (0.5MG  Tablet, Oral) Active. Losartan Potassium (50MG  Tablet, Oral) Active. Probiotic & Acidophilus Ex St (Oral) Active. Fish Oil + D3 (1000-1000MG -UNIT Capsule, Oral) Active. Magnesium (200MG  Tablet, Oral) Active. Medications Reconciled  Social History  Caffeine use  Coffee, Tea. No alcohol use  No drug use  Tobacco use  Former smoker.  Family History  Alcohol Abuse  Father. Arthritis  Brother, Mother, Sister. Cervical Cancer  Mother. Diabetes Mellitus  Sister. Hypertension  Brother, Mother, Sister.  Vitals  Weight: 172 lb Height: 65in Body Surface Area: 1.86 m Body Mass Index: 28.62 kg/m  Temp.: 97.30F  Pulse: 79 (Regular)  BP: 122/74 (Sitting, Left Arm, Standard) Physical Exam General Mental Status-Alert. Orientation-Oriented X3. Chest and Lung Exam Chest and lung exam reveals -quiet, even and easy respiratory effort with no use of accessory muscles and on auscultation, normal breath sounds, no adventitious sounds and normal vocal resonance. Cardiovascular Cardiovascular examination reveals -normal heart sounds, regular rate and rhythm with no murmurs and no digital clubbing, cyanosis, edema, increased warmth or  tenderness. Abdomen Note: soft nt/nd epigastric and umbilical ventral hernias reducible mildly tender   Assessment & Plan VENTRAL INCISIONAL HERNIA WITHOUT OBSTRUCTION OR GANGRENE (K43.2) Story: I recommended laparoscopic repair of both these hernias with mesh. I think this is better than observation due to increased symptoms. we discussed risks including bowel injury due to adhesions, bleeding, infection, recurrence and medical risks. discussed possible ileus and hospital stay postop as well as restrictions.

## 2017-06-11 NOTE — Discharge Instructions (Signed)
[facility] LAPAROSCOPIC SURGERY: POST OP INSTRUCTIONS  Always review your discharge instruction sheet given to you by the facility where your surgery was performed. IF YOU HAVE DISABILITY OR FAMILY LEAVE FORMS, YOU MUST BRING THEM TO THE OFFICE FOR PROCESSING.   DO NOT GIVE THEM TO YOUR DOCTOR.  1. A prescription for pain medication may be given to you upon discharge.  Take your pain medication as prescribed, if needed.  If narcotic pain medicine is not needed, then you may take acetaminophen (Tylenol), naprosyn (Alleve), or ibuprofen (Advil) as needed. 2. Take your usually prescribed medications unless otherwise directed. 3. If you need a refill on your pain medication, please contact your pharmacy.  They will contact our office to request authorization. Prescriptions will not be filled after 5pm or on week-ends. 4. You should follow a light diet the first few days after arrival home, such as soup and crackers, etc.  Be sure to include lots of fluids daily. 5. Most patients will experience some swelling and bruising in the area of the incisions.  Ice packs will help.  Swelling and bruising can take several days to resolve.  6. It is common to experience some constipation if taking pain medication after surgery.  Increasing fluid intake and taking a stool softener (such as Colace) will usually help or prevent this problem from occurring.  A mild laxative (Milk of Magnesia or Miralax) should be taken according to package instructions if there are no bowel movements after 48 hours. 7. Unless discharge instructions indicate otherwise, you may remove your bandages 48 hours after surgery, and you may shower at that time.  You may have steri-strips (small skin tapes) in place directly over the incision.  These strips should be left on the skin for 7-10 days.  If your surgeon used skin glue on the incision, you may shower in 24 hours.  The glue will flake  off over the next 2-3 weeks.  Any sutures or staples will be removed at the office during your follow-up visit. 8. ACTIVITIES:  You may resume regular (light) daily activities beginning the next day--such as daily self-care, walking, climbing stairs--gradually increasing activities as tolerated.  You may have sexual intercourse when it is comfortable.  Refrain from any heavy lifting or straining until approved by your doctor. a. You may drive when you are no longer taking prescription pain medication, you can comfortably wear a seatbelt, and you can safely maneuver your car and apply brakes. b. RETURN TO WORK:  __________________________________________________________ 9. You should see your doctor in the office for a follow-up appointment approximately 2-3 weeks after your surgery.  Make sure that you call for this appointment within a day or two after you arrive home to insure a convenient appointment time. 10. OTHER INSTRUCTIONS: __________________________________________________________________________________________________________________________ __________________________________________________________________________________________________________________________ WHEN TO CALL YOUR DOCTOR: 1. Fever over 101.0 2. Inability to urinate 3. Continued bleeding from incision. 4. Increased pain, redness, or drainage from the incision. 5. Increasing abdominal pain  The clinic staff is available to answer your questions during regular business hours.  Please don't hesitate to call and ask to speak to one of the nurses for clinical concerns.  If you have a medical emergency, go to the nearest emergency room or call 911.  A surgeon from [facility] is always on call at the hospital. [address], [address], [place], [place]  [address] ? [address] [address], [place], [place]   [address] [phone] ? [phone] ? [phone]   [phone] Web site: [email or web address]  

## 2017-06-11 NOTE — Anesthesia Procedure Notes (Addendum)
Anesthesia Regional Block: TAP block   Pre-Anesthetic Checklist: ,, timeout performed, Correct Patient, Correct Site, Correct Laterality, Correct Procedure, Correct Position, site marked, Risks and benefits discussed,  Surgical consent,  Pre-op evaluation,  At surgeon's request and post-op pain management  Laterality: Right  Prep: chloraprep       Needles:  Injection technique: Single-shot  Needle Type: Echogenic Needle     Needle Length: 10cm  Needle Gauge: 21     Additional Needles:   Procedures:,,,, ultrasound used (permanent image in chart),,,,  Narrative:  Start time: 06/11/2017 7:03 AM End time: 06/11/2017 7:09 AM Injection made incrementally with aspirations every 5 mL.  Performed by: Personally  Anesthesiologist: Suzette Battiest

## 2017-06-11 NOTE — Transfer of Care (Signed)
Immediate Anesthesia Transfer of Care Note  Patient: Christina [name]  Procedure(s) Performed: LAPAROSCOPIC VENTRAL HERNIA REPAIR WITH MESH ERAS PATHWAY (N/A Abdomen) INSERTION OF MESH (N/A Abdomen)  Patient Location: PACU  Anesthesia Type:General and Regional  Level of Consciousness: awake, oriented and patient cooperative  Airway & Oxygen Therapy: Patient Spontanous Breathing and Patient connected to nasal cannula oxygen  Post-op Assessment: Report given to RN, Post -op Vital signs reviewed and stable and Patient moving all extremities  Post vital signs: Reviewed and stable  Last Vitals:  Vitals:   06/11/17 0724 06/11/17 0911  BP:    Pulse: 92 81  Resp: 16 (!) 9  Temp:  36.7 C  SpO2: 98% 98%    Last Pain:  Vitals:   06/11/17 0911  PainSc: 7          Complications: No apparent anesthesia complications

## 2017-06-11 NOTE — Anesthesia Procedure Notes (Addendum)
Anesthesia Regional Block: TAP block   Pre-Anesthetic Checklist: ,, timeout performed, Correct Patient, Correct Site, Correct Laterality, Correct Procedure, Correct Position, site marked, Risks and benefits discussed,  Surgical consent,  Pre-op evaluation,  At surgeon's request and post-op pain management  Laterality: Left  Prep: chloraprep       Needles:  Injection technique: Single-shot  Needle Type: Echogenic Needle     Needle Length: 10cm  Needle Gauge: 21     Additional Needles:   Procedures:,,,, ultrasound used (permanent image in chart),,,,  Narrative:  Start time: 06/11/2017 6:55 AM End time: 06/11/2017 7:02 AM Injection made incrementally with aspirations every 5 mL.  Performed by: Personally  Anesthesiologist: Suzette Battiest

## 2017-06-12 ENCOUNTER — Encounter (HOSPITAL_COMMUNITY): Payer: Self-pay | Admitting: General Surgery

## 2017-06-12 DIAGNOSIS — K430 Incisional hernia with obstruction, without gangrene: Secondary | ICD-10-CM | POA: Diagnosis not present

## 2017-06-12 LAB — BASIC METABOLIC PANEL
Anion gap: 6 (ref 5–15)
BUN: 9 mg/dL (ref 6–20)
CO2: 25 mmol/L (ref 22–32)
Calcium: 9.2 mg/dL (ref 8.9–10.3)
Chloride: 106 mmol/L (ref 101–111)
Creatinine, Ser: 0.64 mg/dL (ref 0.44–1.00)
GFR calc Af Amer: >60 mL/min (ref >60)
GFR calc non Af Amer: >60 mL/min (ref >60)
Glucose, Bld: 116 mg/dL — ABNORMAL HIGH (ref 65–99)
Potassium: 3.7 mmol/L (ref 3.5–5.1)
Sodium: 137 mmol/L (ref 135–145)

## 2017-06-12 MED ORDER — METHOCARBAMOL 500 MG PO TABS
500.0000 mg | ORAL_TABLET | Freq: Three times a day (TID) | ORAL | Status: DC
  Administered 2017-06-12 – 2017-06-13 (×4): 500 mg via ORAL
  Filled 2017-06-12 (×4): qty 1

## 2017-06-12 MED ORDER — ONDANSETRON HCL 4 MG/2ML IJ SOLN
4.0000 mg | Freq: Four times a day (QID) | INTRAMUSCULAR | Status: DC
  Filled 2017-06-12: qty 2

## 2017-06-12 MED ORDER — ONDANSETRON HCL 4 MG/2ML IJ SOLN
4.0000 mg | Freq: Four times a day (QID) | INTRAMUSCULAR | Status: DC | PRN
  Administered 2017-06-12: 4 mg via INTRAVENOUS

## 2017-06-12 NOTE — Progress Notes (Signed)
1 Day Post-Op   Subjective/Chief Complaint: Sore this am, up a little voiding, no flatus, tol liquids   Objective: Vital signs in last 24 hours: Temp:  [97.3 F (36.3 C)-98.3 F (36.8 C)] 97.8 F (36.6 C) (10/17 0604) Pulse Rate:  [58-81] 64 (10/17 0604) Resp:  [7-22] 18 (10/17 0604) BP: (118-169)/(59-85) 144/62 (10/17 0604) SpO2:  [92 %-100 %] 97 % (10/17 0604) Weight:  [73.5 kg (162 lb)] 73.5 kg (162 lb) (10/16 1311)    Intake/Output from previous day: 10/16 0701 - 10/17 0700 In: 2402.5 [P.O.:680; I.V.:1722.5] Out: 1620 [Urine:1600; Blood:20] Intake/Output this shift: No intake/output data recorded.  General appearance: no distress Resp: clear to auscultation bilaterally Cardio: regular rate and rhythm GI: mild distended some bs present incisions clean  Lab Results:  No results for input(s): WBC, HGB, HCT, PLT in the last 72 hours. BMET  Recent Labs  06/12/17 0344  NA 137  K 3.7  CL 106  CO2 25  GLUCOSE 116*  BUN 9  CREATININE 0.64  CALCIUM 9.2   PT/INR No results for input(s): LABPROT, INR in the last 72 hours. ABG No results for input(s): PHART, HCO3 in the last 72 hours.  Invalid input(s): PCO2, PO2  Studies/Results: No results found.  Anti-infectives: Anti-infectives    Start     Dose/Rate Route Frequency Ordered Stop   06/11/17 0554  ceFAZolin (ANCEF) 2-4 GM/100ML-% IVPB    Comments:  Starleen Arms   : cabinet override      06/11/17 0554 06/11/17 0730   06/11/17 0547  ceFAZolin (ANCEF) IVPB 2g/100 mL premix     2 g 200 mL/hr over 30 Minutes Intravenous On call to O.R. 06/11/17 0547 06/11/17 0745      Assessment/Plan: POD 1 lap ventral hernia x2 repair with mesh  1. Will continue iv pain meds with po, scheduled tylenol, scheduled robaxin 2. pulm toilet, oob 3. Regular diet, will monitor for ileus 4. Dc iv fluids today, bmet fine 5. Home antihtn meds 6. lovenox scds 7. dispo here another 24 hours for pain  control  WAKEFIELD,MATTHEW 06/12/2017

## 2017-06-13 DIAGNOSIS — K430 Incisional hernia with obstruction, without gangrene: Secondary | ICD-10-CM | POA: Diagnosis not present

## 2017-06-13 MED ORDER — METHOCARBAMOL 500 MG PO TABS: 500.0000 mg | ORAL_TABLET | Freq: Three times a day (TID) | ORAL | 0 refills | Status: DC

## 2017-06-13 NOTE — Discharge Summary (Signed)
Physician Discharge Summary  Patient ID: Christina [name] MRN: [id number] DOB/AGE: 1941-07-11 76 y.o.  Admit date: 06/11/2017 Discharge date: 06/13/2017  Admission Diagnoses: Ventral hernia times two  Discharge Diagnoses:  Active Problems:   Ventral hernia   Discharged Condition: good  Hospital Course: 42 yof who underwent laparoscopic ventral hernia repair with ventralight mesh.  She remained in hospital for two days for pain control. She is passing flatus.  She is tolerating diet. Pain controlled. She is ready for discharge  Consults: None  Significant Diagnostic Studies: none  Treatments: surgery    Disposition: 01-Home or Self Care   Allergies as of 06/13/2017      Reactions   Nutrasweet Aspartame [aspartame] Diarrhea   Hydrochlorothiazide Cough   Lisinopril Cough      Medication List    TAKE these medications   fish oil-omega-3 fatty acids 1000 MG capsule Take 1 g by mouth daily.   losartan 50 MG tablet Commonly known as:  COZAAR TAKE 1 TABLET BY MOUTH EVERY DAY   magnesium gluconate 500 MG tablet Commonly known as:  MAGONATE Take 500 mg by mouth daily.   methocarbamol 500 MG tablet Commonly known as:  ROBAXIN Take 1 tablet (500 mg total) by mouth 3 (three) times daily.   oxyCODONE 5 MG immediate release tablet Commonly known as:  Oxy IR/ROXICODONE Take 1 tablet (5 mg total) by mouth every 6 (six) hours as needed for moderate pain, severe pain or breakthrough pain.   polyethylene glycol powder powder Commonly known as:  GLYCOLAX/MIRALAX Take 17 g by mouth daily. What changed:  when to take this  reasons to take this   PROBIOTIC-10 PO Take 1 capsule by mouth daily.   triamcinolone cream 0.1 % Commonly known as:  KENALOG Apply topically 2 (two) times daily. What changed:  how much to take  when to take this  reasons to take this   Vitamin D (Ergocalciferol) 50000 units Caps capsule Commonly known as:  DRISDOL Take 1 capsule  (50,000 Units total) by mouth every 7 (seven) days.      Follow-up Information    Rolm Bookbinder, MD Follow up in 2 week(s).   Specialty:  General Surgery Contact information: [address] [place] [address] [phone]           Signed: Rolm Bookbinder 06/13/2017, 2:25 PM

## 2017-06-13 NOTE — Progress Notes (Signed)
2 Days Post-Op   Subjective/Chief Complaint: Doing much better, having some flatus although one episode emesis after chicken salad, up and around, pain controlled   Objective: Vital signs in last 24 hours: Temp:  [98.1 F (36.7 C)-98.6 F (37 C)] 98.4 F (36.9 C) (10/18 0623) Pulse Rate:  [68-93] 78 (10/18 0623) Resp:  [18] 18 (10/18 0623) BP: (121-139)/(55-67) 130/67 (10/18 0623) SpO2:  [95 %-98 %] 97 % (10/18 0623) Last BM Date: 06/05/17  Intake/Output from previous day: 10/17 0701 - 10/18 0700 In: 540 [P.O.:540] Out: 1600 [Urine:1600] Intake/Output this shift: No intake/output data recorded.  General appearance: no distress Resp: clear to auscultation bilaterally Cardio: regular rate and rhythm GI: soft approp tender not distended today, incisions clean  Lab Results:  No results for input(s): WBC, HGB, HCT, PLT in the last 72 hours. BMET  Recent Labs  06/12/17 0344  NA 137  K 3.7  CL 106  CO2 25  GLUCOSE 116*  BUN 9  CREATININE 0.64  CALCIUM 9.2   PT/INR No results for input(s): LABPROT, INR in the last 72 hours. ABG No results for input(s): PHART, HCO3 in the last 72 hours.  Invalid input(s): PCO2, PO2  Studies/Results: No results found.  Anti-infectives: Anti-infectives    Start     Dose/Rate Route Frequency Ordered Stop   06/11/17 0554  ceFAZolin (ANCEF) 2-4 GM/100ML-% IVPB    Comments:  Starleen Arms   : cabinet override      06/11/17 0554 06/11/17 0730   06/11/17 0547  ceFAZolin (ANCEF) IVPB 2g/100 mL premix     2 g 200 mL/hr over 30 Minutes Intravenous On call to O.R. 06/11/17 0547 06/11/17 0745      Assessment/Plan: POD 2 lap ventral hernia x2 repair with mesh  1. Po pain meds 2. Dc home today if does well with breakfast   WAKEFIELD,MATTHEW 06/13/2017

## 2017-06-13 NOTE — Care Management Obs Status (Signed)
[facility] NOTIFICATION   Patient Details  Name: Christina [name] MRN: [id number] Date of Birth: 04/17/1941   Medicare Observation Status Notification Given:  Yes    Carles Collet, RN 06/13/2017, 9:12 AM

## 2017-06-13 NOTE — Progress Notes (Signed)
Patient was given discharge instructions and verbalized understanding. Patient left unit with all belongings and family in stable condition.

## 2017-06-18 NOTE — Addendum Note (Signed)
Addendum  created 06/18/17 1603 by Suzette Battiest, MD   Anesthesia Intra Blocks edited, Sign clinical note

## 2017-06-25 ENCOUNTER — Encounter (HOSPITAL_COMMUNITY): Payer: Self-pay | Admitting: General Surgery

## 2017-07-01 ENCOUNTER — Other Ambulatory Visit (INDEPENDENT_AMBULATORY_CARE_PROVIDER_SITE_OTHER): Payer: Self-pay | Admitting: Family Medicine

## 2017-07-01 DIAGNOSIS — E559 Vitamin D deficiency, unspecified: Secondary | ICD-10-CM

## 2017-07-29 ENCOUNTER — Ambulatory Visit (INDEPENDENT_AMBULATORY_CARE_PROVIDER_SITE_OTHER): Payer: Medicare Other | Admitting: Family Medicine

## 2017-07-29 VITALS — BP 133/66 | HR 66 | Temp 98.1°F | Ht 65.0 in | Wt 161.0 lb

## 2017-07-29 DIAGNOSIS — E669 Obesity, unspecified: Secondary | ICD-10-CM

## 2017-07-29 DIAGNOSIS — Z6830 Body mass index (BMI) 30.0-30.9, adult: Secondary | ICD-10-CM | POA: Diagnosis not present

## 2017-07-29 DIAGNOSIS — E559 Vitamin D deficiency, unspecified: Secondary | ICD-10-CM | POA: Diagnosis not present

## 2017-07-29 MED ORDER — VITAMIN D (ERGOCALCIFEROL) 1.25 MG (50000 UNIT) PO CAPS: 50000.0000 [IU] | ORAL_CAPSULE | ORAL | 0 refills | Status: DC

## 2017-07-29 NOTE — Progress Notes (Signed)
Office: [phone]  /  Fax: [phone]   HPI:   Chief Complaint: OBESITY Christina [name] is here to discuss her progress with her obesity treatment plan. She is on the Category 1 plan and is following her eating plan approximately 70 to 80 % of the time. She states she is exercising 0 minutes 0 times per week. Christina [name] continues to do well with weight loss and is now maintaining her weight loss. She has lots of celebration eating this month and needs some strategies. Her weight is 161 lb (73 kg) today and has had a weight loss of 3 pounds over a period of 10 weeks since her last visit. She has lost 23 lbs since starting treatment with [place].  Vitamin D deficiency Christina [name] has a diagnosis of vitamin D deficiency. She is currently stable, but is not yet at goal and she is not taking vit D anymore. Fatigue is improved and she denies nausea, vomiting or muscle weakness.  ALLERGIES: Allergies  Allergen Reactions   Nutrasweet Aspartame [Aspartame] Diarrhea   Hydrochlorothiazide Cough   Lisinopril Cough    MEDICATIONS: Current Outpatient Medications on File Prior to Visit  Medication Sig Dispense Refill   fish oil-omega-3 fatty acids 1000 MG capsule Take 1 g by mouth daily.       losartan (COZAAR) 50 MG tablet TAKE 1 TABLET BY MOUTH EVERY DAY 90 tablet 3   magnesium gluconate (MAGONATE) 500 MG tablet Take 500 mg by mouth daily.     polyethylene glycol powder (GLYCOLAX/MIRALAX) powder Take 17 g by mouth daily. (Patient taking differently: Take 17 g by mouth daily as needed for moderate constipation. ) 3350 g 0   Probiotic Product (PROBIOTIC-10 PO) Take 1 capsule by mouth daily.      triamcinolone cream (KENALOG) 0.1 % Apply topically 2 (two) times daily. (Patient taking differently: Apply 1 application topically 2 (two) times daily as needed (in ears). ) 80 g 0   No current facility-administered medications on file prior to visit.     PAST MEDICAL HISTORY: Past Medical History:    Diagnosis Date   Acid reflux    Anxiety    Arthritis    Asthma    a little?   Back pain    Cervical cancer screening 01/31/2012   Chicken pox as a child   Depression    Depression with anxiety 06/07/2009   Qualifier: Diagnosis of  By: [name] MD, Catherine     Dermatitis of external ear 07/11/2012   Diarrhea 03/22/2017   Dysphagia 01/31/2012   Headache    Heartburn    Hiatal hernia with gastroesophageal reflux 10/26/2010   Qualifier: Diagnosis of  By: [name] MD, Catherine     Hyperglycemia 06/21/2013   Hyperlipidemia, mixed 10/16/2015   Hypertension    Joint pain    Left hip pain 07/10/2015   Lesion of breast 12/03/2016   Low back pain 05/29/2015   Measles as a child   Mumps as a child   Neck pain on left side 06/21/2013   Obesity 10/07/2008   Qualifier: Diagnosis of  By: [name] MD, Otilio Jefferson 01/03/2017   Overweight (BMI 25.0-29.9) 10/07/2008   Qualifier: Diagnosis of  By: [name] MD, Catherine     Palpitations    Prediabetes    Preventative health care 01/31/2012   Routine health maintenance 01/31/2012   RUQ pain 05/29/2015   Sinusitis, acute 07/10/2012   SOB (shortness of breath)    Spinal stenosis  Umbilical hernia    Urinary frequency 09/28/2011   Vertigo     PAST SURGICAL HISTORY: Past Surgical History:  Procedure Laterality Date   BREAST BIOPSY Right 1980   BREAST EXCISIONAL BIOPSY   BREAST BIOPSY Left 1970   BREAST EXCISIONAL BIOPSY   CYSTECTOMY     abdomen   EYE SURGERY Bilateral    cataract   HERNIA REPAIR     INSERTION OF MESH N/A 06/11/2017   Procedure: INSERTION OF MESH;  Surgeon: Rolm Bookbinder, MD;  Location: [facility];  Service: General;  Laterality: N/A;  BILATERAL TAP BLOCK   LAPAROSCOPIC ASSISTED VENTRAL HERNIA REPAIR  06/11/2017   w/mesh   TUBAL LIGATION  1970   VENTRAL HERNIA REPAIR N/A 06/11/2017   Procedure: LAPAROSCOPIC VENTRAL HERNIA REPAIR WITH MESH ERAS PATHWAY;  Surgeon:  Rolm Bookbinder, MD;  Location: [facility];  Service: General;  Laterality: N/A;  BILATERAL TAP BLOCK    SOCIAL HISTORY: Social History   Tobacco Use   Smoking status: Former Smoker    Packs/day: 1.00    Years: 30.00    Pack years: 30.00    Types: Cigarettes    Last attempt to quit: 08/27/1990    Years since quitting: 26.9   Smokeless tobacco: Never Used  Substance Use Topics   Alcohol use: No   Drug use: No    FAMILY HISTORY: Family History  Problem Relation Age of Onset   Cancer Mother 61       lung- smoker, ovarian   Hypertension Mother    Anxiety disorder Mother    Alcohol abuse Father    Cancer Sister        ovarian   Diabetes Sister    Hypertension Sister    Other Maternal Grandmother        pacemaker   Multiple sclerosis Maternal Grandfather        ?   Asthma Paternal Uncle     ROS: Review of Systems  Constitutional: Positive for weight loss.  Gastrointestinal: Negative for nausea and vomiting.  Musculoskeletal:       Negative muscle weakness    PHYSICAL EXAM: Blood pressure 133/66, pulse 66, temperature 98.1 F (36.7 C), temperature source Oral, height 5\' 5"  (1.651 m), weight 161 lb (73 kg), SpO2 97 %. Body mass index is 26.79 kg/m. Physical Exam  Constitutional: She is oriented to person, place, and time. She appears well-developed and well-nourished.  Cardiovascular: Normal rate.  Pulmonary/Chest: Effort normal.  Musculoskeletal: Normal range of motion.  Neurological: She is oriented to person, place, and time.  Skin: Skin is warm and dry.  Psychiatric: She has a normal mood and affect. Her behavior is normal.  Vitals reviewed.   RECENT LABS AND TESTS: BMET    Component Value Date/Time   NA 137 06/12/2017 0344   NA 142 03/04/2017 1016   K 3.7 06/12/2017 0344   CL 106 06/12/2017 0344   CO2 25 06/12/2017 0344   GLUCOSE 116 (H) 06/12/2017 0344   BUN 9 06/12/2017 0344   BUN 13 03/04/2017 1016   CREATININE 0.64 06/12/2017 0344     CREATININE 0.78 05/20/2015 1704   CALCIUM 9.2 06/12/2017 0344   GFRNONAA >60 06/12/2017 0344   GFRAA >60 06/12/2017 0344   Lab Results  Component Value Date   HGBA1C 5.7 (H) 03/04/2017   HGBA1C 6.0 01/03/2017   HGBA1C 6.0 10/05/2016   HGBA1C 5.8 04/03/2016   HGBA1C 6.0 10/07/2015   Lab Results  Component Value Date  INSULIN 11.1 03/04/2017   CBC    Component Value Date/Time   WBC 9.2 06/03/2017 1317   RBC 4.49 06/03/2017 1317   HGB 14.0 06/03/2017 1317   HCT 43.1 06/03/2017 1317   PLT 223 06/03/2017 1317   MCV 96.0 06/03/2017 1317   MCH 31.2 06/03/2017 1317   MCHC 32.5 06/03/2017 1317   RDW 14.3 06/03/2017 1317   Iron/TIBC/Ferritin/ %Sat No results found for: IRON, TIBC, FERRITIN, IRONPCTSAT Lipid Panel     Component Value Date/Time   CHOL 164 01/03/2017 1137   TRIG 97.0 01/03/2017 1137   HDL 48.80 01/03/2017 1137   CHOLHDL 3 01/03/2017 1137   VLDL 19.4 01/03/2017 1137   LDLCALC 96 01/03/2017 1137   Hepatic Function Panel     Component Value Date/Time   PROT 6.5 03/04/2017 1016   ALBUMIN 4.3 03/04/2017 1016   AST 21 03/04/2017 1016   ALT 15 03/04/2017 1016   ALKPHOS 77 03/04/2017 1016   BILITOT 0.9 03/04/2017 1016   BILIDIR 0.1 05/31/2014 1612   IBILI 0.6 05/26/2013 0921      Component Value Date/Time   TSH 1.89 01/03/2017 1137   TSH 1.51 10/05/2016 1100   TSH 1.92 04/03/2016 0845    ASSESSMENT AND PLAN: Vitamin D deficiency - Plan: Vitamin D, Ergocalciferol, (DRISDOL) 50000 units CAPS capsule, VITAMIN D 25 Hydroxy (Vit-D Deficiency, Fractures)  Class 1 obesity with serious comorbidity and body mass index (BMI) of 30.0 to 30.9 in adult, unspecified obesity type - Starting BMI greater then 30  PLAN:  Vitamin D Deficiency Christina [name] was informed that low vitamin D levels contributes to fatigue and are associated with obesity, breast, and colon cancer. She agrees to continue to take prescription Vit D @50 ,000 IU every week, we will refill for 1  month. We will check labs and will follow up for routine testing of vitamin D, at least 2-3 times per year. She was informed of the risk of over-replacement of vitamin D and agrees to not increase her dose unless he discusses this with [place] first. Christina [name] agrees to follow up with our clinic in 3 months.  Obesity Christina [name] is currently in the action stage of change. As such, her goal is to continue with weight loss efforts She has agreed to follow the Category 1 plan Christina [name] has been instructed to work up to a goal of 150 minutes of combined cardio and strengthening exercise per week for weight loss and overall health benefits. We discussed the following Behavioral Modification Strategies today: no skipping meals, increasing lean protein intake and holiday eating strategies   Christina [name] has agreed to follow up with our clinic in 3 months. She was informed of the importance of frequent follow up visits to maximize her success with intensive lifestyle modifications for her multiple health conditions.  Christina [name], Christina [name], am acting as transcriptionist for Christina Nip, MD  Christina [name] have reviewed the above documentation for accuracy and completeness, and Christina [name] agree with the above. -Christina Nip, MD    OBESITY BEHAVIORAL INTERVENTION VISIT  Today's visit was # 7 out of 22.  Starting weight: 184 lbs Starting date: 03/04/17 Today's weight : 161 lbs Today's date: 07/29/2017 Total lbs lost to date: 23 (Patients must lose 7 lbs in the first 6 months to continue with counseling)   ASK: We discussed the diagnosis of obesity with [name] today and [facility] agreed to give [place] permission to discuss obesity behavioral modification therapy today.  ASSESS: Paulita has the diagnosis of obesity  and her BMI today is 26.79 Tyquasia is in the action stage of change   ADVISE: Rahcel was educated on the multiple health risks of obesity as well as the benefit of weight loss to improve her health. She was  advised of the need for long term treatment and the importance of lifestyle modifications.  AGREE: Multiple dietary modification options and treatment options were discussed and  Brayah agreed to follow the Category 1 plan We discussed the following Behavioral Modification Strategies today: no skipping meals, increasing lean protein intake and holiday eating strategies

## 2017-07-30 LAB — VITAMIN D 25 HYDROXY (VIT D DEFICIENCY, FRACTURES): Vit D, 25-Hydroxy: 39.5 ng/mL (ref 30.0–100.0)

## 2017-08-16 ENCOUNTER — Other Ambulatory Visit: Payer: Self-pay | Admitting: Family Medicine

## 2017-08-16 DIAGNOSIS — Z1231 Encounter for screening mammogram for malignant neoplasm of breast: Secondary | ICD-10-CM

## 2017-08-22 ENCOUNTER — Ambulatory Visit: Payer: Medicare Other

## 2017-08-22 ENCOUNTER — Ambulatory Visit (INDEPENDENT_AMBULATORY_CARE_PROVIDER_SITE_OTHER): Payer: Medicare Other

## 2017-08-22 DIAGNOSIS — Z1231 Encounter for screening mammogram for malignant neoplasm of breast: Secondary | ICD-10-CM

## 2017-09-02 ENCOUNTER — Encounter: Payer: Self-pay | Admitting: Family Medicine

## 2017-09-02 ENCOUNTER — Ambulatory Visit: Payer: Medicare Other | Admitting: Family Medicine

## 2017-09-02 VITALS — BP 113/71 | HR 68 | Temp 97.2°F | Wt 166.1 lb

## 2017-09-02 DIAGNOSIS — E782 Mixed hyperlipidemia: Secondary | ICD-10-CM | POA: Diagnosis not present

## 2017-09-02 DIAGNOSIS — M21622 Bunionette of left foot: Secondary | ICD-10-CM | POA: Diagnosis not present

## 2017-09-02 DIAGNOSIS — M2062 Acquired deformities of toe(s), unspecified, left foot: Secondary | ICD-10-CM | POA: Insufficient documentation

## 2017-09-02 DIAGNOSIS — I10 Essential (primary) hypertension: Secondary | ICD-10-CM | POA: Diagnosis not present

## 2017-09-02 DIAGNOSIS — E663 Overweight: Secondary | ICD-10-CM

## 2017-09-02 HISTORY — DX: Bunionette of left foot: M21.622

## 2017-09-02 NOTE — Patient Instructions (Signed)
It was a pleasure meeting you today.  Follo wup in 6 months for wither physcial (if insurance allows) or Hypertension. We will collect fasting labs at that appt.    Check with your pharmacist about the Losartan. I would only change the medication if your losartan is part of the recall since your BP is well controlled or you are desiring to switch.    If your feet start to bother you more or cause pain, then would recommend a podiatrist.    Please help [place] help you:  We are honored you have chosen [facility] for your Primary Care home. Below you will find basic instructions that you may need to access in the future. Please help [place] help you by reading the instructions, which cover many of the frequent questions we experience.   Prescription refills and request:  -In order to allow more efficient response time, please call your pharmacy for all refills. They will forward the request electronically to [place]. This allows for the quickest possible response. Request left on a nurse line can take longer to refill, since these are checked as time allows between office patients and other phone calls.  - refill request can take up to 3-5 working days to complete.  - If request is sent electronically and request is appropiate, it is usually completed in 1-2 business days.  - all patients will need to be seen routinely for all chronic medical conditions requiring prescription medications (see follow-up below). If you are overdue for follow up on your condition, you will be asked to make an appointment and we will call in enough medication to cover you until your appointment (up to 30 days).  - all controlled substances will require a face to face visit to request/refill.  - if you desire your prescriptions to go through a new pharmacy, and have an active script at original pharmacy, you will need to call your pharmacy and have scripts transferred to new pharmacy. This is completed between the pharmacy  locations and not by your provider.    Results: If any images or labs were ordered, it can take up to 1 week to get results depending on the test ordered and the lab/facility running and resulting the test. - Normal or stable results, which do not need further discussion, may be released to your mychart immediately with attached note to you. A call may not be generated for normal results. Please make certain to sign up for mychart. If you have questions on how to activate your mychart you can call the front office.  - If your results need further discussion, our office will attempt to contact you via phone, and if unable to reach you after 2 attempts, we will release your abnormal result to your mychart with instructions.  - All results will be automatically released in mychart after 1 week.  - Your provider will provide you with explanation and instruction on all relevant material in your results. Please keep in mind, results and labs may appear confusing or abnormal to the untrained eye, but it does not mean they are actually abnormal for you personally. If you have any questions about your results that are not covered, or you desire more detailed explanation than what was provided, you should make an appointment with your provider to do so.   Our office handles many outgoing and incoming calls daily. If we have not contacted you within 1 week about your results, please check your mychart to see  if there is a message first and if not, then contact our office.  In helping with this matter, you help decrease call volume, and therefore allow [place] to be able to respond to patients needs more efficiently.   Acute office visits (sick visit):  An acute visit is intended for a new problem and are scheduled in shorter time slots to allow schedule openings for patients with new problems. This is the appropriate visit to discuss a new problem. In order to provide you with excellent quality medical care with proper  time for you to explain your problem, have an exam and receive treatment with instructions, these appointments should be limited to one new problem per visit. If you experience a new problem, in which you desire to be addressed, please make an acute office visit, we save openings on the schedule to accommodate you. Please do not save your new problem for any other type of visit, let us take care of it properly and quickly for you.   Follow up visits:  Depending on your condition(s) your provider will need to see you routinely in order to provide you with quality care and prescribe medication(s). Most chronic conditions (Example: hypertension, Diabetes, depression/anxiety... etc), require visits a couple times a year. Your provider will instruct you on proper follow up for your personal medical conditions and history. Please make certain to make follow up appointments for your condition as instructed. Failing to do so could result in lapse in your medication treatment/refills. If you request a refill, and are overdue to be seen on a condition, we will always provide you with a 30 day script (once) to allow you time to schedule.    Medicare wellness (well visit): - we have a wonderful Nurse Maudie Mercury), that will meet with you and provide you will yearly medicare wellness visits. These visits should occur yearly (can not be scheduled less than 1 calendar year apart) and cover preventive health, immunizations, advance directives and screenings you are entitled to yearly through your medicare benefits. Do not miss out on your entitled benefits, this is when medicare will pay for these benefits to be ordered for you.  These are strongly encouraged by your provider and is the appropriate type of visit to make certain you are up to date with all preventive health benefits. If you have not had your medicare wellness exam in the last 12 months, please make certain to schedule one by calling the office and schedule your  medicare wellness with Maudie Mercury as soon as possible.   Yearly physical (well visit):  - Adults are recommended to be seen yearly for physicals. Check with your insurance and date of your last physical, most insurances require one calendar year between physicals. Physicals include all preventive health topics, screenings, medical exam and labs that are appropriate for gender/age and history. You may have fasting labs needed at this visit. This is a well visit (not a sick visit), new problems should not be covered during this visit (see acute visit).  - Pediatric patients are seen more frequently when they are younger. Your provider will advise you on well child visit timing that is appropriate for your their age. - This is not a medicare wellness visit. Medicare wellness exams do not have an exam portion to the visit. Some medicare companies allow for a physical, some do not allow a yearly physical. If your medicare allows a yearly physical you can schedule the medicare wellness with our nurse Maudie Mercury and have your  physical with your provider after, on the same day. Please check with insurance for your full benefits.   Late Policy/No Shows:  - all new patients should arrive 15-30 minutes earlier than appointment to allow [place] time  to  obtain all personal demographics,  insurance information and for you to complete office paperwork. - All established patients should arrive 10-15 minutes earlier than appointment time to update all information and be checked in .  - In our best efforts to run on time, if you are late for your appointment you will be asked to either reschedule or if able, we will work you back into the schedule. There will be a wait time to work you back in the schedule,  depending on availability.  - If you are unable to make it to your appointment as scheduled, please call 24 hours ahead of time to allow [place] to fill the time slot with someone else who needs to be seen. If you do not cancel your  appointment ahead of time, you may be charged a no show fee.

## 2017-09-02 NOTE — Progress Notes (Signed)
Patient ID: Christina [name], female  DOB: 14-Apr-1941, 77 y.o.   MRN: [id number] Patient Care Team    Relationship Specialty Notifications Start End  Ma Hillock, DO PCP - General Family Medicine  09/02/17   Rolm Bookbinder, MD Consulting Physician General Surgery  10/05/16   Monna Fam, MD Consulting Physician Ophthalmology  10/05/16   Garry Heater, DDS Consulting Physician Dentistry  10/05/16   Juanita Craver, MD Consulting Physician Gastroenterology  09/02/17     Chief Complaint  Patient presents with  . Establish Care    for chronic medical conditions.    Subjective:  Christina [name] is a 77 y.o.  female present for new patient establishment. All past medical history, surgical history, allergies, family history, immunizations, medications and social history were updated and entered in the electronic medical record today. All recent labs, ED visits and hospitalizations within the last year were reviewed.  Hypertension/overweight/HLD: Pt reports compliance with losartan 50 mg QD. She has concerns over warnings of cancer causing agents in the medication.  Blood pressures ranges at home are normal. Patient denies chest pain, shortness of breath or lower extremity edema. Pt does not take a  daily baby ASA. Pt is not prescribed statin. She does take fish oil supplement. Mild hyperlipidemia 2015. BMP: 06/12/2017, mildly elevated glucose, otherwise normal.  CBC: 06/03/2017 WNL TSH: 01/03/2017 WNL A1c: 5.7 02/2017 Lipid: 01/03/2017 WNL Diet: low sodium Exercise: routine RF: HTN, mild HLD, BMI > 25 former smoker  Vit d deficiency/osteopenia: Pt reports she takes Vit D 50000u weekly for her vit d deficiency.   Toe deformity: Pt reports her left 5 th toe is starting to move under her left 4th toe. She denies pain or redness. She does not endorse blisters or break in skin. She also reports her right 4th toe is a little uncomfortable when walking barefoot on hard surface. She feels  like the toe joint "is no longer there."   Depression screen [facility] 2/9 09/02/2017 03/04/2017 10/05/2016 04/05/2016 02/04/2015  Decreased Interest 0 1 0 0 0  Down, Depressed, Hopeless 0 3 0 0 0  PHQ - 2 Score 0 4 0 0 0  Altered sleeping - 2 - - -  Tired, decreased energy - 2 - - -  Change in appetite - 2 - - -  Feeling bad or failure about yourself  - 0 - - -  Trouble concentrating - 1 - - -  Moving slowly or fidgety/restless - 0 - - -  Suicidal thoughts - 0 - - -  PHQ-9 Score - 11 - - -   No flowsheet data found.      Fall Risk  09/02/2017 10/05/2016 04/05/2016 02/04/2015  Falls in the past year? No No No No     Immunization History  Administered Date(s) Administered  . Influenza Whole 06/07/2009, 05/28/2011, 05/28/2012  . Influenza, High Dose Seasonal PF 05/20/2015  . Influenza,inj,Quad PF,6+ Mos 05/26/2013, 05/31/2014  . Pneumococcal Conjugate-13 05/31/2014  . Pneumococcal Polysaccharide-23 06/07/2009  . Td 01/31/2003  . Tdap 09/28/2011    No exam data present  Past Medical History:  Diagnosis Date  . Arthritis   . Asthma    a little?  . Cervical cancer screening 01/31/2012  . Chicken pox as a child  . Depression with anxiety 06/07/2009   Qualifier: Diagnosis of  By: [name] MD, [name]    . Dermatitis of external ear 07/11/2012  . Dysphagia 01/31/2012  . Hiatal hernia with gastroesophageal reflux  10/26/2010   Qualifier: Diagnosis of  By: [name] MD, [name]    . Hyperglycemia 06/21/2013  . Hyperlipidemia, mixed 10/16/2015  . Hypertension   . Left hip pain 07/10/2015  . Lesion of breast 12/03/2016   benign; resolved  . Low back pain 05/29/2015  . Measles as a child  . Mumps as a child  . Neck pain on left side 06/21/2013  . Osteopenia 01/03/2017  . Overweight (BMI 25.0-29.9) 10/07/2008   Qualifier: Diagnosis of  By: [name] MD, [name]    . Palpitations   . Prediabetes   . RUQ pain 05/29/2015  . Spinal stenosis   . Umbilical hernia   . Urinary frequency 09/28/2011    . Vertigo    Allergies  Allergen Reactions  . Nutrasweet Aspartame [Aspartame] Diarrhea  . Hydrochlorothiazide Cough  . Lisinopril Cough   Past Surgical History:  Procedure Laterality Date  . BREAST BIOPSY Right 1980   BREAST EXCISIONAL BIOPSY  . BREAST BIOPSY Left 1970   BREAST EXCISIONAL BIOPSY  . BREAST CYST ASPIRATION    . CYSTECTOMY     abdomen  . EYE SURGERY Bilateral    cataract  . INSERTION OF MESH N/A 06/11/2017   Procedure: INSERTION OF MESH;  Surgeon: Rolm Bookbinder, MD;  Location: [place];  Service: General;  Laterality: N/A;  BILATERAL TAP BLOCK  . LAPAROSCOPIC ASSISTED VENTRAL HERNIA REPAIR  06/11/2017   w/mesh  . TUBAL LIGATION  1970  . VENTRAL HERNIA REPAIR N/A 06/11/2017   Procedure: LAPAROSCOPIC VENTRAL HERNIA REPAIR WITH MESH ERAS PATHWAY;  Surgeon: Rolm Bookbinder, MD;  Location: [place];  Service: General;  Laterality: N/A;  BILATERAL TAP BLOCK   Family History  Problem Relation Age of Onset  . Hypertension Mother   . Anxiety disorder Mother   . Ovarian cancer Mother 10       lung- smoker, ovarian  . Lung cancer Mother        smoker  . Alcohol abuse Father   . Diabetes Sister   . Hypertension Sister   . Ovarian cancer Sister   . Other Maternal Grandmother        pacemaker  . Multiple sclerosis Maternal Grandfather        ?  [name] Asthma Paternal Uncle    Social History   Socioeconomic History  . Marital status: Widowed    Spouse name: Not on file  . Number of children: Not on file  . Years of education: Not on file  . Highest education level: Not on file  Social Needs  . Financial resource strain: Not on file  . Food insecurity - worry: Not on file  . Food insecurity - inability: Not on file  . Transportation needs - medical: Not on file  . Transportation needs - non-medical: Not on file  Occupational History  . Occupation: retired  Tobacco Use  . Smoking status: Former Smoker    Packs/day: 1.00    Years: 30.00    Pack years:  30.00    Types: Cigarettes    Last attempt to quit: 08/27/1990    Years since quitting: 27.0  . Smokeless tobacco: Never Used  Substance and Sexual Activity  . Alcohol use: No  . Drug use: No  . Sexual activity: No    Comment: lives alone, no dietary restrictions  Other Topics Concern  . Not on file  Social History Narrative   Retired. Lives alone.    Attended some business college.    Former smoker.  Smoke alarm in the home. Wears seat balt.    Wears dentures.    Feels safe in her relationships.       Allergies as of 09/02/2017      Reactions   Nutrasweet Aspartame [aspartame] Diarrhea   Hydrochlorothiazide Cough   Lisinopril Cough      Medication List        Accurate as of 09/02/17 12:33 PM. Always use your most recent med list.          fish oil-omega-3 fatty acids 1000 MG capsule Take 1 g by mouth daily.   losartan 50 MG tablet Commonly known as:  COZAAR TAKE 1 TABLET BY MOUTH EVERY DAY   magnesium gluconate 500 MG tablet Commonly known as:  MAGONATE Take 500 mg by mouth daily.   polyethylene glycol powder powder Commonly known as:  GLYCOLAX/MIRALAX Take 17 g by mouth daily.   PROBIOTIC-10 PO Take 1 capsule by mouth daily.   triamcinolone cream 0.1 % Commonly known as:  KENALOG Apply topically 2 (two) times daily.   Vitamin D (Ergocalciferol) 50000 units Caps capsule Commonly known as:  DRISDOL Take 1 capsule (50,000 Units total) by mouth every 7 (seven) days.       All past medical history, surgical history, allergies, family history, immunizations andmedications were updated in the EMR today and reviewed under the history and medication portions of their EMR.    Recent Results (from the past 2160 hour(s))  Basic metabolic panel     Status: Abnormal   Collection Time: 06/12/17  3:44 AM  Result Value Ref Range   Sodium 137 135 - 145 mmol/L   Potassium 3.7 3.5 - 5.1 mmol/L   Chloride 106 101 - 111 mmol/L   CO2 25 22 - 32 mmol/L   Glucose, Bld  116 (H) 65 - 99 mg/dL   BUN 9 6 - 20 mg/dL   Creatinine, Ser 0.64 0.44 - 1.00 mg/dL   Calcium 9.2 8.9 - 10.3 mg/dL   GFR calc non Af Amer >60 >60 mL/min   GFR calc Af Amer >60 >60 mL/min    Comment: (NOTE) The eGFR has been calculated using the CKD EPI equation. This calculation has not been validated in all clinical situations. eGFR's persistently <60 mL/min signify possible Chronic Kidney Disease.    Anion gap 6 5 - 15  VITAMIN D 25 Hydroxy (Vit-D Deficiency, Fractures)     Status: None   Collection Time: 07/29/17 11:41 AM  Result Value Ref Range   Vit D, 25-Hydroxy 39.5 30.0 - 100.0 ng/mL    Comment: Vitamin D deficiency has been defined by the [place] practice guideline as a level of serum 25-OH vitamin D less than 20 ng/mL (1,2). The Endocrine Society went on to further define vitamin D insufficiency as a level between 21 and 29 ng/mL (2). 1. IOM (Institute of Medicine). 2010. Dietary reference    intakes for calcium and D. [place]: The    [employer]. 2. Holick MF, Binkley Wagner, Bischoff-Ferrari HA, et al.    Evaluation, treatment, and prevention of vitamin D    deficiency: an Endocrine Society clinical practice    guideline. JCEM. 2011 Jul; 96(7):1911-30.     No results found.   ROS: 14 pt review of systems performed and negative (unless mentioned in an HPI)  Objective: BP 113/71 (BP Location: Left Arm, Patient Position: Sitting, Cuff Size: Normal)   Pulse 68   Temp (!) 97.2  F (36.2 C) (Oral)   Wt 166 lb 1.9 oz (75.4 kg)   SpO2 95%   BMI 27.64 kg/m  Gen: Afebrile. No acute distress. Nontoxic in appearance, well-developed, well-nourished,  Very pleasant caucasian female.  HENT: AT. [place]. MMM, no oral lesions Eyes:Pupils Equal Round Reactive to light, Extraocular movements intact,  Conjunctiva without redness, discharge or icterus. CV: RRR no murmur, no edema, +2/4 P posterior tibialis pulses. no carotid  bruits. No JVD. Chest: CTAB, no wheeze, rhonchi or crackles. normal Respiratory effort. good Air movement. Skin:  Warm and well-perfused. Skin intact. Neuro/Msk: Normal gait. PERLA. EOMi. Alert. Oriented x3. Mild varus deformity left 5 th toe with bunion. Mild right 4th swan neck deformity.  Psych: Normal affect, dress and demeanor. Normal speech. Normal thought content and judgment.   Assessment/plan: CIENNA DUMAIS is a 77 y.o. female present for transfer of care.  Essential hypertension, benign/Overweight (BMI 25.0-29.9)/Mixed hyperlipidemia - stable. Chronic conditions.  - She is doing well loosing weight (following with Dr. Leafy Ro).  - Med: losartan 50 mg QD. She will continue this medication and check with her pharmacist to see if her med is part of recall. If she is part of recall will switch to amlodipine trial (allergic/SE to other options). No refills needed at this time.  - labs UTD.  - f/U 6 months.   Vit D: normal 07/29/2017. Continue weekly supplement.   Tailor's bunionette, left/Toe deformity, acquired, left - acquired deformity of left and right foot.  - Left quintus varus with small bunionette. Currently not painful for her.  - Right 4th toe swan neck deformity, also not bothering her much.  - Discussed podiatry referral for recommendations and she would like to wait. She states she has some foot pads at home. Monitor for pain, redness or worsening deformity.    Return in about 6 months (around 03/02/2018) for HTN or CPE depending on her insurance. .   Note is dictated utilizing voice recognition software. Although note has been proof read prior to signing, occasional typographical errors still can be missed. If any questions arise, please do not hesitate to call for verification.  Electronically signed by: Howard Pouch, DO [place]

## 2017-10-20 ENCOUNTER — Other Ambulatory Visit (INDEPENDENT_AMBULATORY_CARE_PROVIDER_SITE_OTHER): Payer: Self-pay | Admitting: Family Medicine

## 2017-10-20 DIAGNOSIS — E559 Vitamin D deficiency, unspecified: Secondary | ICD-10-CM

## 2017-10-24 ENCOUNTER — Ambulatory Visit (INDEPENDENT_AMBULATORY_CARE_PROVIDER_SITE_OTHER): Payer: Medicare Other | Admitting: Physician Assistant

## 2017-10-24 VITALS — BP 135/74 | HR 74 | Temp 97.8°F | Ht 65.0 in | Wt 167.0 lb

## 2017-10-24 DIAGNOSIS — E669 Obesity, unspecified: Secondary | ICD-10-CM | POA: Diagnosis not present

## 2017-10-24 DIAGNOSIS — R7303 Prediabetes: Secondary | ICD-10-CM

## 2017-10-24 DIAGNOSIS — Z6830 Body mass index (BMI) 30.0-30.9, adult: Secondary | ICD-10-CM | POA: Diagnosis not present

## 2017-10-24 DIAGNOSIS — E559 Vitamin D deficiency, unspecified: Secondary | ICD-10-CM | POA: Diagnosis not present

## 2017-10-24 MED ORDER — VITAMIN D (ERGOCALCIFEROL) 1.25 MG (50000 UNIT) PO CAPS: 50000.0000 [IU] | ORAL_CAPSULE | ORAL | 0 refills | Status: DC

## 2017-10-24 NOTE — Progress Notes (Signed)
Office: [phone]  /  Fax: [phone]   HPI:   Chief Complaint: OBESITY Christina [name] is here to discuss her progress with her obesity treatment plan. She is on the Category 1 plan and is following her eating plan approximately 75 % of the time. She states she is exercising 0 minutes 0 times per week. [place] last follow up was on 07/29/17. She states she has not been following the meal plan as she does not want to lose any weight. She refuses to come in for a follow up before 3 months.  Her weight is 167 lb (75.8 kg) today and has gained 6 pounds since her last visit. She has lost 17 lbs since starting treatment with [place].  Pre-Diabetes Christina [name] has a diagnosis of pre-diabetes based on her elevated Hgb A1c and was informed this puts her at greater risk of developing diabetes. She declines metformin. She denies polyphagia, nausea, or hypoglycemia.  Vitamin D Deficiency Christina [name] has a diagnosis of vitamin D deficiency. She is currently taking prescription Vit D and denies nausea, vomiting or muscle weakness.  ALLERGIES: Allergies  Allergen Reactions  . Nutrasweet Aspartame [Aspartame] Diarrhea  . Hydrochlorothiazide Cough  . Lisinopril Cough    MEDICATIONS: Current Outpatient Medications on File Prior to Visit  Medication Sig Dispense Refill  . fish oil-omega-3 fatty acids 1000 MG capsule Take 1 g by mouth daily.      [name] losartan (COZAAR) 50 MG tablet TAKE 1 TABLET BY MOUTH EVERY DAY 90 tablet 3  . magnesium gluconate (MAGONATE) 500 MG tablet Take 500 mg by mouth daily.    . polyethylene glycol powder (GLYCOLAX/MIRALAX) powder Take 17 g by mouth daily. (Patient taking differently: Take 17 g by mouth daily as needed for moderate constipation. ) 3350 g 0  . Probiotic Product (PROBIOTIC-10 PO) Take 1 capsule by mouth daily.     [name] triamcinolone cream (KENALOG) 0.1 % Apply topically 2 (two) times daily. (Patient taking differently: Apply 1 application topically 2 (two) times daily as  needed (in ears). ) 80 g 0  . Vitamin D, Ergocalciferol, (DRISDOL) 50000 units CAPS capsule Take 1 capsule (50,000 Units total) by mouth every 7 (seven) days. 12 capsule 0   No current facility-administered medications on file prior to visit.     PAST MEDICAL HISTORY: Past Medical History:  Diagnosis Date  . Arthritis   . Asthma    a little?  . Cervical cancer screening 01/31/2012  . Chicken pox as a child  . Depression with anxiety 06/07/2009   Qualifier: Diagnosis of  By: [name] MD, [name]    . Dermatitis of external ear 07/11/2012  . Dysphagia 01/31/2012  . Hiatal hernia with gastroesophageal reflux 10/26/2010   Qualifier: Diagnosis of  By: [name] MD, [name]    . Hyperglycemia 06/21/2013  . Hyperlipidemia, mixed 10/16/2015  . Hypertension   . Left hip pain 07/10/2015  . Lesion of breast 12/03/2016   benign; resolved  . Low back pain 05/29/2015  . Measles as a child  . Mumps as a child  . Neck pain on left side 06/21/2013  . Osteopenia 01/03/2017  . Overweight (BMI 25.0-29.9) 10/07/2008   Qualifier: Diagnosis of  By: [name] MD, [name]    . Palpitations   . Prediabetes   . RUQ pain 05/29/2015  . Spinal stenosis   . Umbilical hernia   . Urinary frequency 09/28/2011  . Vertigo     PAST SURGICAL HISTORY: Past Surgical History:  Procedure Laterality Date  .  BREAST BIOPSY Right 1980   BREAST EXCISIONAL BIOPSY  . BREAST BIOPSY Left 1970   BREAST EXCISIONAL BIOPSY  . BREAST CYST ASPIRATION    . CYSTECTOMY     abdomen  . EYE SURGERY Bilateral    cataract  . INSERTION OF MESH N/A 06/11/2017   Procedure: INSERTION OF MESH;  Surgeon: Rolm Bookbinder, MD;  Location: [place];  Service: General;  Laterality: N/A;  BILATERAL TAP BLOCK  . LAPAROSCOPIC ASSISTED VENTRAL HERNIA REPAIR  06/11/2017   w/mesh  . TUBAL LIGATION  1970  . VENTRAL HERNIA REPAIR N/A 06/11/2017   Procedure: LAPAROSCOPIC VENTRAL HERNIA REPAIR WITH MESH ERAS PATHWAY;  Surgeon: Rolm Bookbinder, MD;   Location: [place];  Service: General;  Laterality: N/A;  BILATERAL TAP BLOCK    SOCIAL HISTORY: Social History   Tobacco Use  . Smoking status: Former Smoker    Packs/day: 1.00    Years: 30.00    Pack years: 30.00    Types: Cigarettes    Last attempt to quit: 08/27/1990    Years since quitting: 27.1  . Smokeless tobacco: Never Used  Substance Use Topics  . Alcohol use: No  . Drug use: No    FAMILY HISTORY: Family History  Problem Relation Age of Onset  . Hypertension Mother   . Anxiety disorder Mother   . Ovarian cancer Mother 7       lung- smoker, ovarian  . Lung cancer Mother        smoker  . Arthritis Mother   . Hyperlipidemia Mother   . Alcohol abuse Father   . Diabetes Sister   . Hypertension Sister   . Ovarian cancer Sister   . Arthritis Sister   . Depression Sister   . Hyperlipidemia Sister   . Other Maternal Grandmother        pacemaker  . Multiple sclerosis Maternal Grandfather        ?  [name] Arthritis Brother   . COPD Brother   . Heart attack Brother   . Hyperlipidemia Brother   . Stroke Brother   . Asthma Sister   . Depression Sister   . Hyperlipidemia Sister   . Asthma Sister   . Depression Sister   . Asthma Paternal Uncle     ROS: Review of Systems  Constitutional: Negative for weight loss.  Gastrointestinal: Negative for nausea and vomiting.  Musculoskeletal:       Negative muscle weakness  Endo/Heme/Allergies:       Negative polyphagia Negative hypoglycemia    PHYSICAL EXAM: Blood pressure 135/74, pulse 74, temperature 97.8 F (36.6 C), temperature source Oral, height 5\' 5"  (1.651 m), weight 167 lb (75.8 kg), SpO2 97 %. Body mass index is 27.79 kg/m. Physical Exam  Constitutional: She is oriented to person, place, and time. She appears well-developed and well-nourished.  Cardiovascular: Normal rate.  Pulmonary/Chest: Effort normal.  Musculoskeletal: Normal range of motion.  Neurological: She is oriented to person, place, and time.    Skin: Skin is warm and dry.  Psychiatric: She has a normal mood and affect. Her behavior is normal.  Vitals reviewed.   RECENT LABS AND TESTS: BMET    Component Value Date/Time   NA 137 06/12/2017 0344   NA 142 03/04/2017 1016   K 3.7 06/12/2017 0344   CL 106 06/12/2017 0344   CO2 25 06/12/2017 0344   GLUCOSE 116 (H) 06/12/2017 0344   BUN 9 06/12/2017 0344   BUN 13 03/04/2017 1016   CREATININE 0.64  06/12/2017 0344   CREATININE 0.78 05/20/2015 1704   CALCIUM 9.2 06/12/2017 0344   GFRNONAA >60 06/12/2017 0344   GFRAA >60 06/12/2017 0344   Lab Results  Component Value Date   HGBA1C 5.7 (H) 03/04/2017   HGBA1C 6.0 01/03/2017   HGBA1C 6.0 10/05/2016   HGBA1C 5.8 04/03/2016   HGBA1C 6.0 10/07/2015   Lab Results  Component Value Date   INSULIN 11.1 03/04/2017   CBC    Component Value Date/Time   WBC 9.2 06/03/2017 1317   RBC 4.49 06/03/2017 1317   HGB 14.0 06/03/2017 1317   HCT 43.1 06/03/2017 1317   PLT 223 06/03/2017 1317   MCV 96.0 06/03/2017 1317   MCH 31.2 06/03/2017 1317   MCHC 32.5 06/03/2017 1317   RDW 14.3 06/03/2017 1317   Iron/TIBC/Ferritin/ %Sat No results found for: IRON, TIBC, FERRITIN, IRONPCTSAT Lipid Panel     Component Value Date/Time   CHOL 164 01/03/2017 1137   TRIG 97.0 01/03/2017 1137   HDL 48.80 01/03/2017 1137   CHOLHDL 3 01/03/2017 1137   VLDL 19.4 01/03/2017 1137   LDLCALC 96 01/03/2017 1137   Hepatic Function Panel     Component Value Date/Time   PROT 6.5 03/04/2017 1016   ALBUMIN 4.3 03/04/2017 1016   AST 21 03/04/2017 1016   ALT 15 03/04/2017 1016   ALKPHOS 77 03/04/2017 1016   BILITOT 0.9 03/04/2017 1016   BILIDIR 0.1 05/31/2014 1612   IBILI 0.6 05/26/2013 0921      Component Value Date/Time   TSH 1.89 01/03/2017 1137   TSH 1.51 10/05/2016 1100   TSH 1.92 04/03/2016 0845  Results for [place], HUBERTY (MRN [id number]) as of 10/24/2017 16:37  Ref. Range 07/29/2017 11:41  Vitamin D, 25-Hydroxy Latest Ref Range: 30.0  - 100.0 ng/mL 39.5    ASSESSMENT AND PLAN: Prediabetes  Vitamin D deficiency - Plan: Vitamin D, Ergocalciferol, (DRISDOL) 50000 units CAPS capsule  Class 1 obesity with serious comorbidity and body mass index (BMI) of 30.0 to 30.9 in adult, unspecified obesity type - greater than 30 at start of program   PLAN:  Pre-Diabetes Christina [name] will continue to work on weight loss, exercise, and decreasing simple carbohydrates in her diet to help decrease the risk of diabetes. We dicussed metformin including benefits and risks. She was informed that eating too many simple carbohydrates or too many calories at one sitting increases the likelihood of GI side effects. Christina [name] declined metformin for now and a prescription was not written today. Christina [name] agrees to follow up with our clinic in 3 months as directed to monitor her progress.  Vitamin D Deficiency Christina [name] was informed that low vitamin D levels contributes to fatigue and are associated with obesity, breast, and colon cancer. Christina [name] agrees to continue taking prescription Vit D @50 ,000 IU every week #4 and we will refill for 1 month. She will follow up for routine testing of vitamin D, at least 2-3 times per year. She was informed of the risk of over-replacement of vitamin D and agrees to not increase her dose unless she discusses this with [place] first. Christina [name] agrees to follow up with our clinic in 3 months.  Obesity Christina [name] is currently in the action stage of change. As such, her goal is to maintain weight for now She has agreed to follow the Category 1 plan Christina [name] has been instructed to work up to a goal of 150 minutes of combined cardio and strengthening exercise per week for weight loss and overall health benefits.  We discussed the following Behavioral Modification Strategies today: increasing lean protein intake and work on meal planning and easy cooking plans   Christina [name] has agreed to follow up with our clinic in 3 months. She was  informed of the importance of frequent follow up visits to maximize her success with intensive lifestyle modifications for her multiple health conditions.   OBESITY BEHAVIORAL INTERVENTION VISIT  Today's visit was # 8 out of 22.  Starting weight: 184 lbs Starting date: 03/14/17 Today's weight : 167 lbs Today's date: 10/24/2017 Total lbs lost to date: 17 (Patients must lose 7 lbs in the first 6 months to continue with counseling)   ASK: We discussed the diagnosis of obesity with Christina [name] today and Christina [name] agreed to give [place] permission to discuss obesity behavioral modification therapy today.  ASSESS: Christina [name] has the diagnosis of obesity and her BMI today is 27.79 Christina [name] is in the action stage of change   ADVISE: Christina [name] was educated on the multiple health risks of obesity as well as the benefit of weight loss to improve her health. She was advised of the need for long term treatment and the importance of lifestyle modifications.  AGREE: Multiple dietary modification options and treatment options were discussed and  Christina [name] agreed to the above obesity treatment plan.   Wilhemena Durie, am acting as transcriptionist for Lacy Duverney, PA-C I, Lacy Duverney [facility], have reviewed this note and agree with its content.

## 2017-10-28 ENCOUNTER — Ambulatory Visit (INDEPENDENT_AMBULATORY_CARE_PROVIDER_SITE_OTHER): Payer: Medicare Other | Admitting: Physician Assistant

## 2017-11-20 IMAGING — US US ABDOMEN COMPLETE
1 series · 13 of 25 positions shown · non-contrast
Comparison: Abdominopelvic CT scan May 11, 2016 and
abdominal ultrasound May 30, 2015

CLINICAL DATA: One year history of intermittent right upper
quadrant pain. Latest episode was 4 days ago.

EXAM:
ABDOMEN ULTRASOUND COMPLETE

[Series 1: us abdomen complete · 0.18mm/px · 13 of 68 slices shown]
[im 1/68]
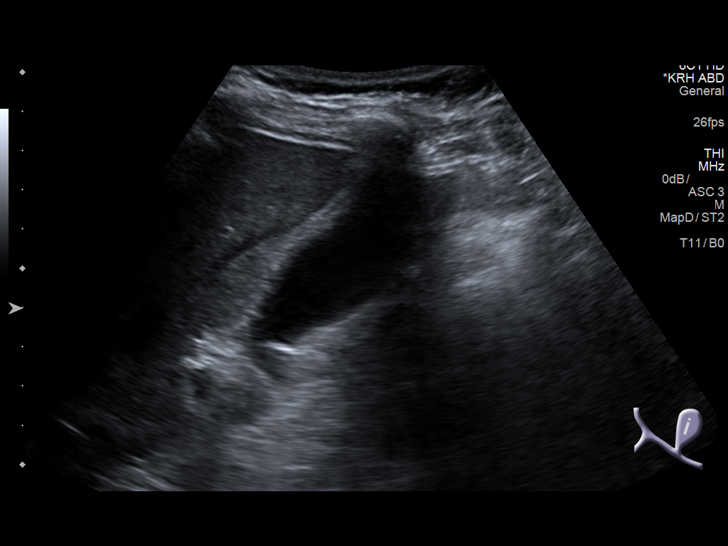
[im 6/68]
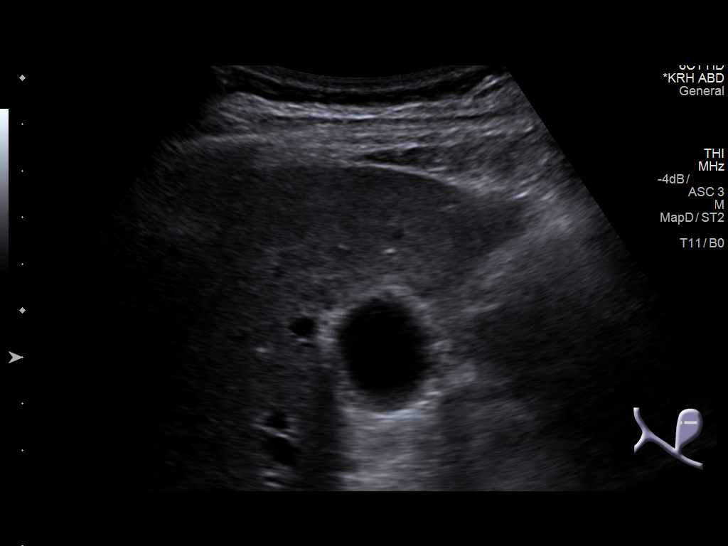
[im 12/68]
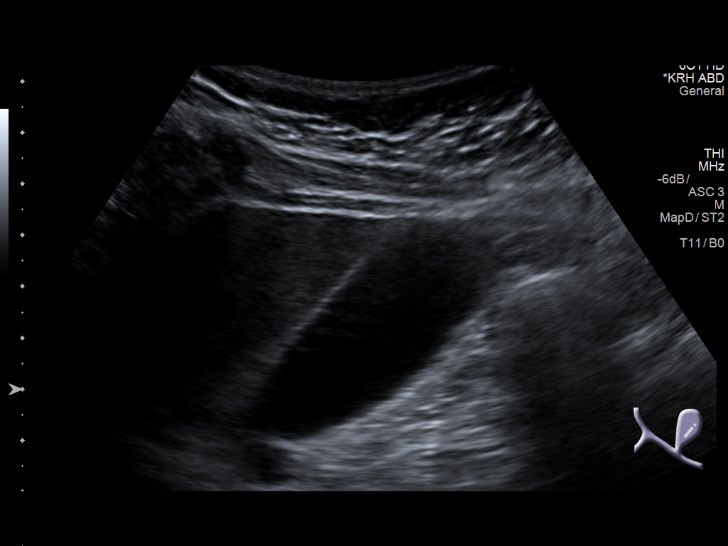
[im 17/68]
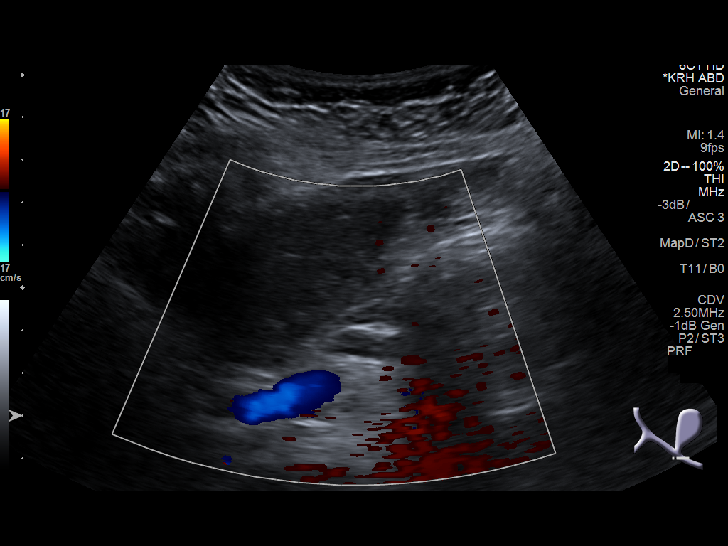
[im 23/68]
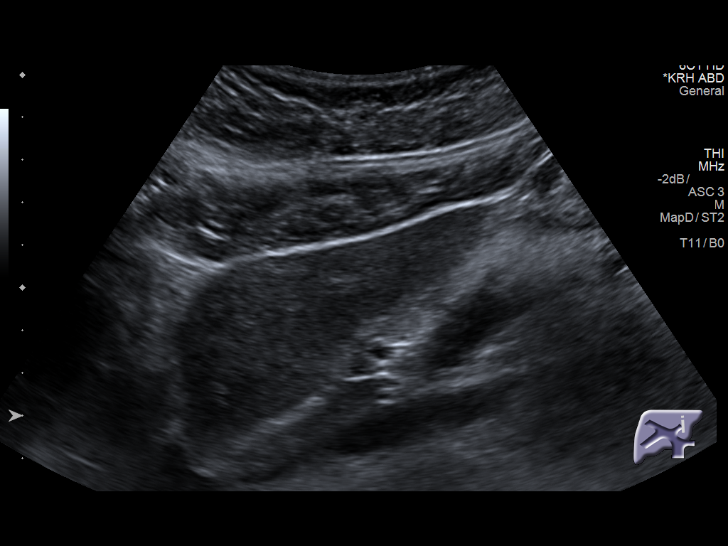
[im 28/68]
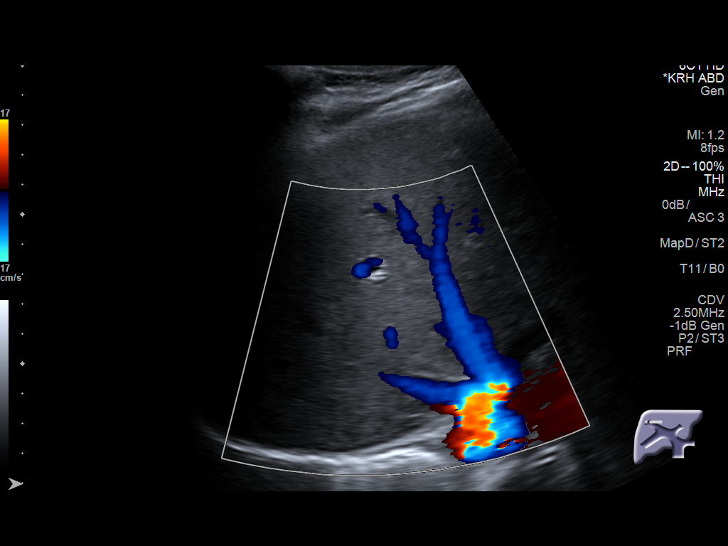
[im 34/68]
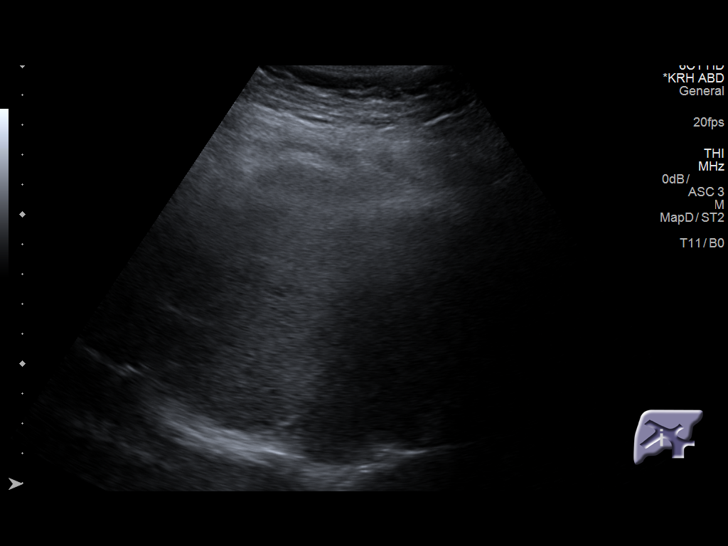
[im 40/68]
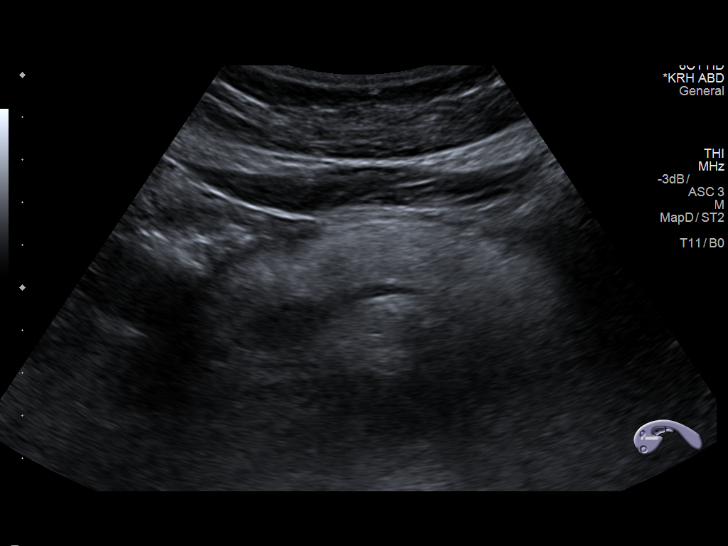
[im 45/68]
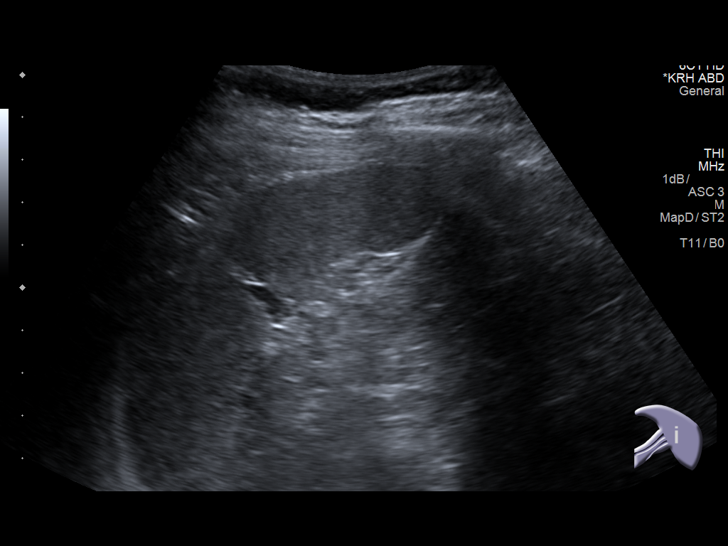
[im 51/68]
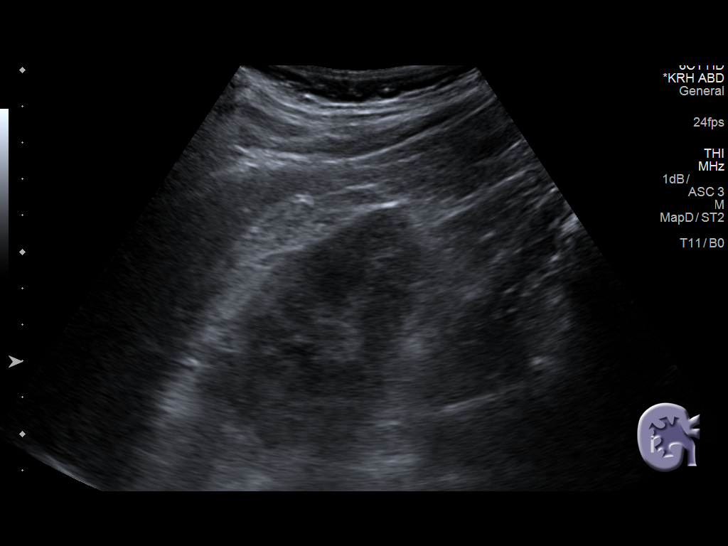
[im 56/68]
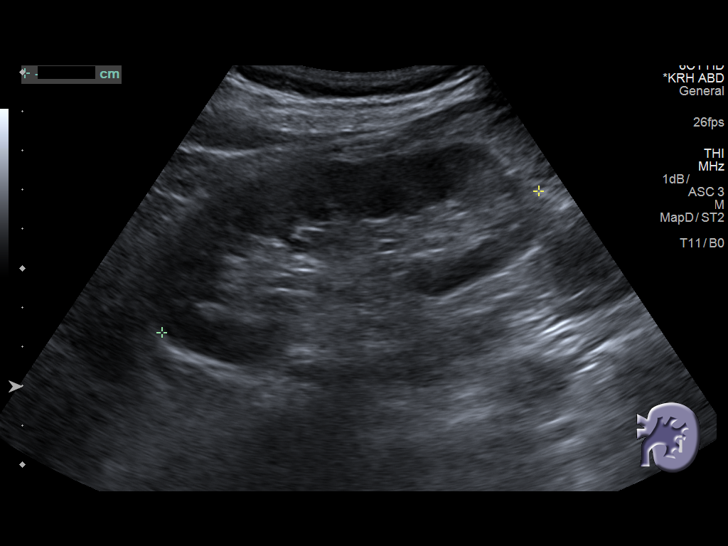
[im 62/68]
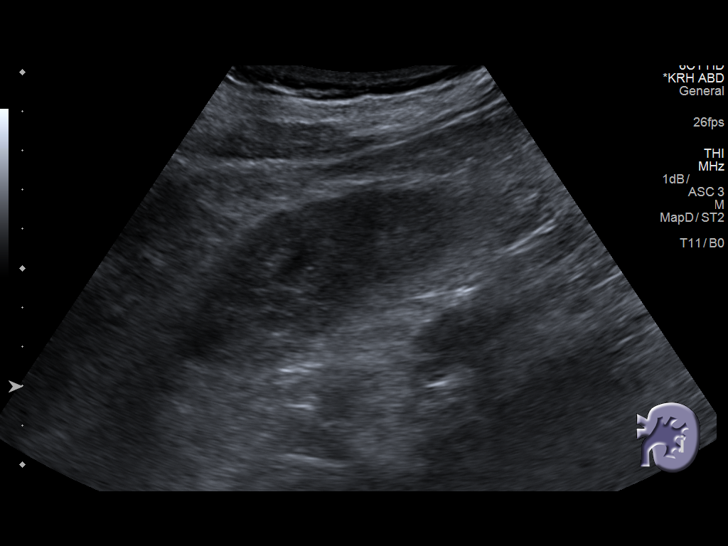
[im 68/68]
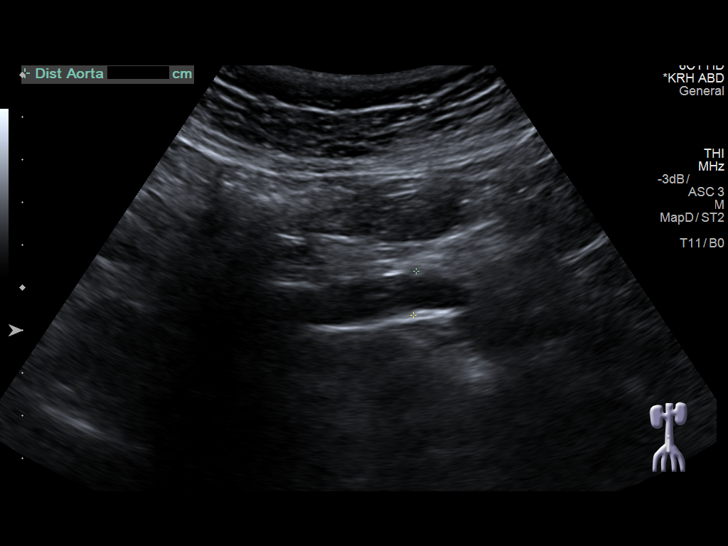

[13 of 25 positions shown; findings below may reference images not displayed]

FINDINGS: Gallbladder: No gallstones or wall thickening visualized. No
sonographic Murphy sign noted by sonographer.

Common bile duct: Diameter: 6.2 mm

Liver: No focal lesion identified. Within normal limits in
parenchymal echogenicity.

IVC: No abnormality visualized.

Pancreas: Visualized portion unremarkable.

Spleen: Size and appearance within normal limits.

Right Kidney: Length: 10.3 cm. The renal cortical echotexture is
normal. There is an echogenic 4 mm focus in the midpole of the right
kidney which may reflect a stone. There is no hydronephrosis.

Left Kidney: Length: 10.3 cm. Echogenicity within normal limits. No
mass or hydronephrosis visualized.

Abdominal aorta: No aneurysm visualized.

Other findings: There is no ascites.
IMPRESSION: No gallstones or sonographic evidence of acute cholecystitis. If
there are clinical concerns of chronic cholecystitis, a nuclear
medicine hepatobiliary scan with gallbladder ejection fraction
determination may be useful.

4 mm nonobstructing stone in the midpole of the right kidney. No
hydronephrosis.

## 2017-12-02 IMAGING — MG 2D DIGITAL DIAGNOSTIC UNILATERAL RIGHT MAMMOGRAM WITH CAD AND AD
6 series · 6 of 14 positions shown · non-contrast
Comparison: Previous exam(s).

CLINICAL DATA: Screening recall for right breast mass.

EXAM:
2D DIGITAL DIAGNOSTIC UNILATERAL RIGHT MAMMOGRAM WITH CAD AND
ADJUNCT TOMO
RIGHT BREAST ULTRASOUND

[R CC synth-2D]
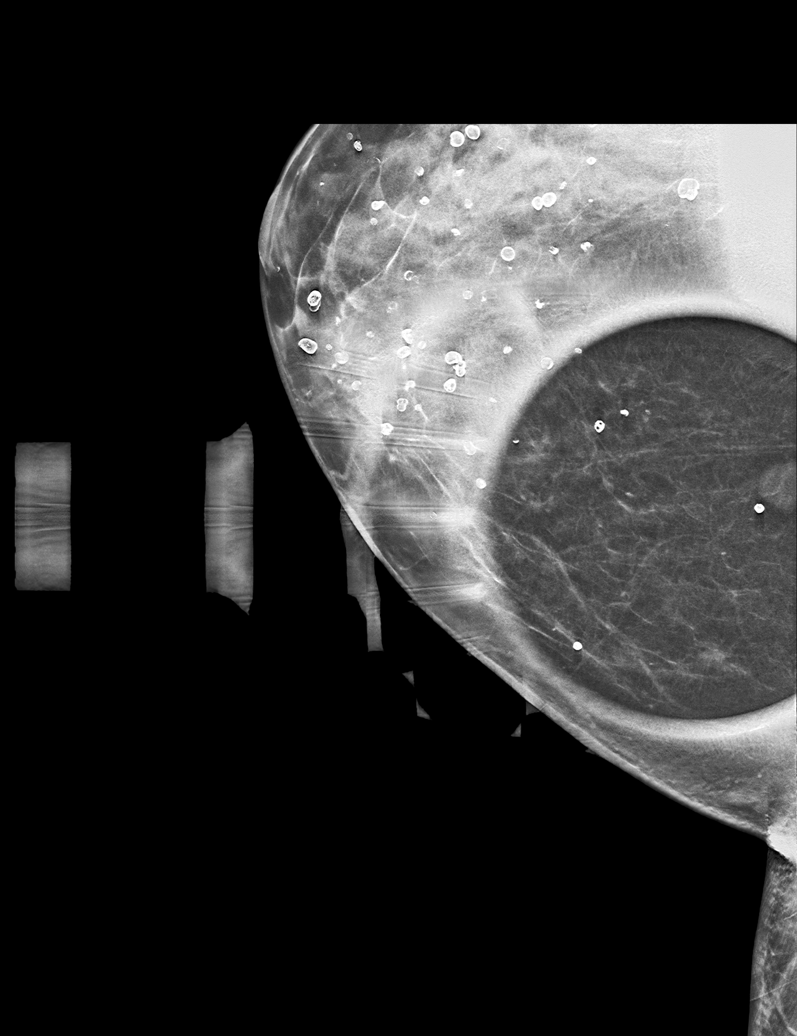

[R CC]
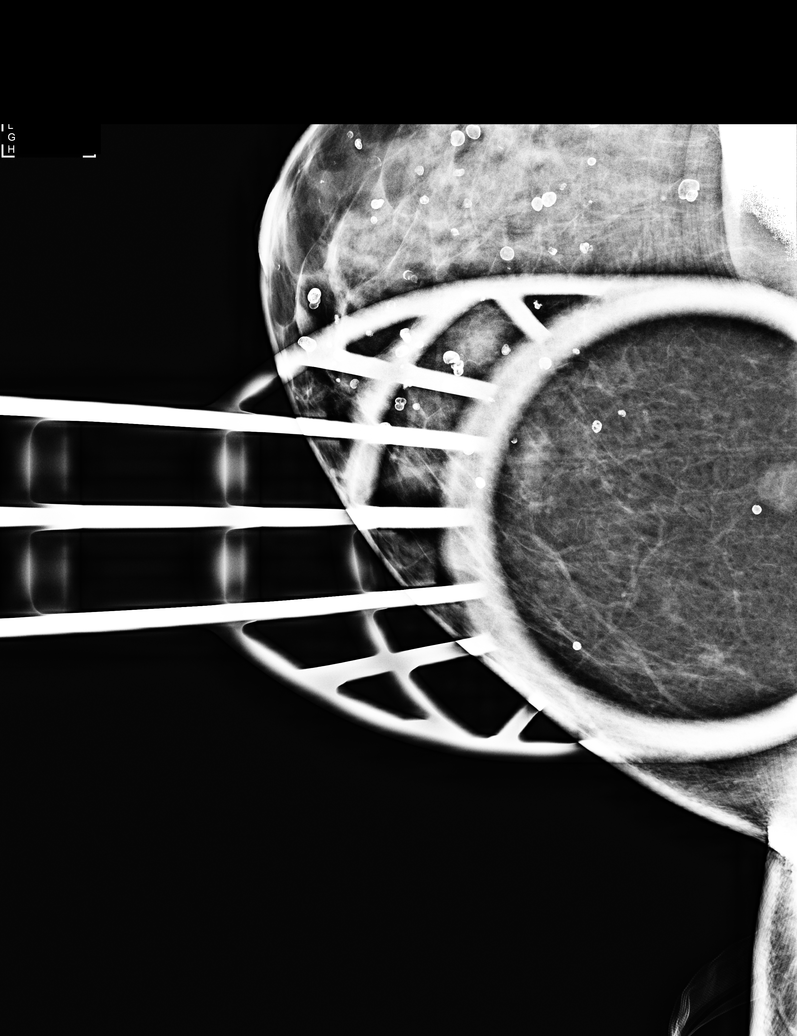

[R MLO synth-2D]
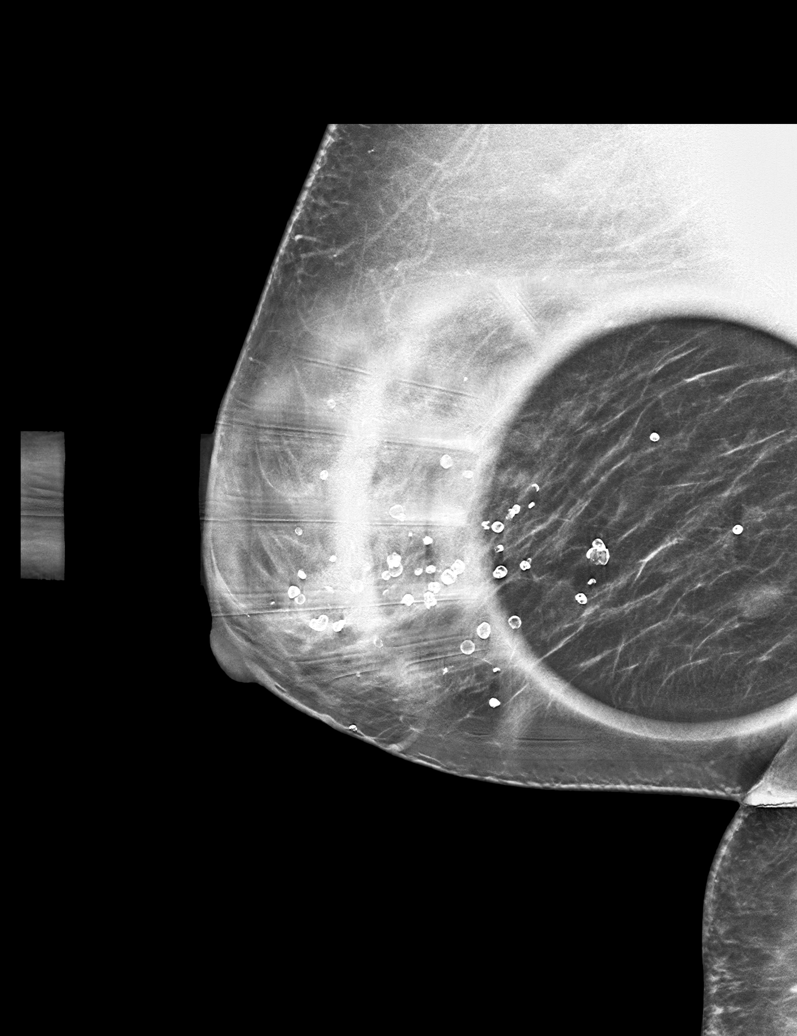

[R MLO]
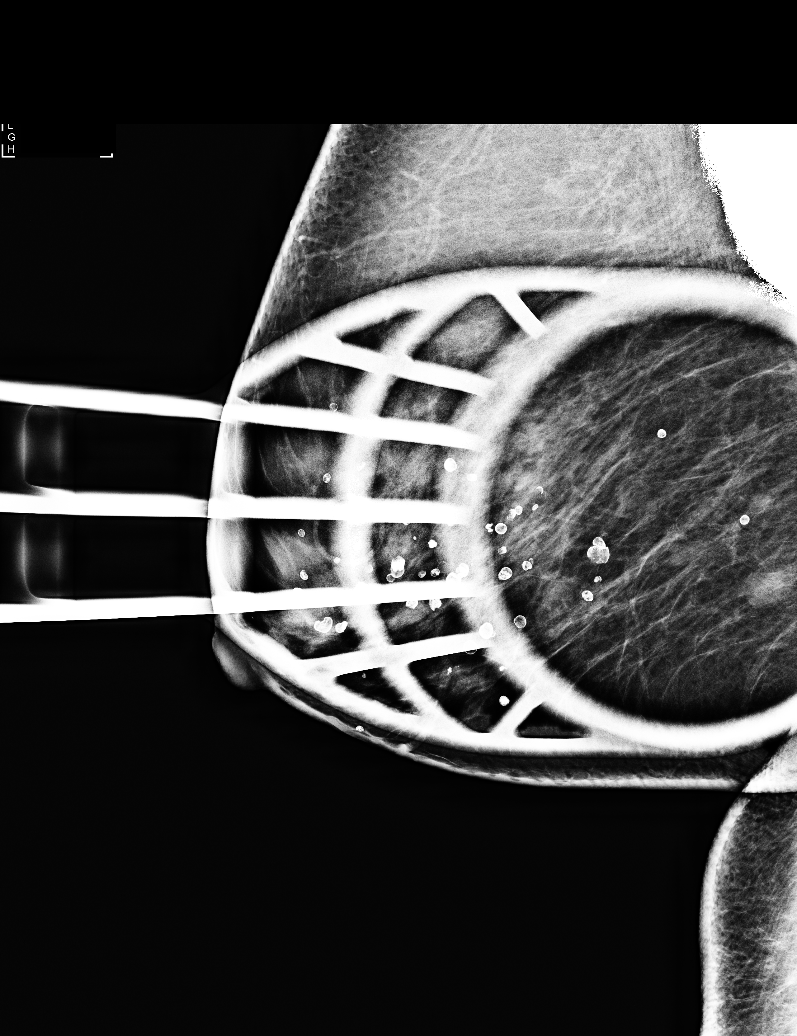

[R MLO tomo · tomo slice 28/55.0]
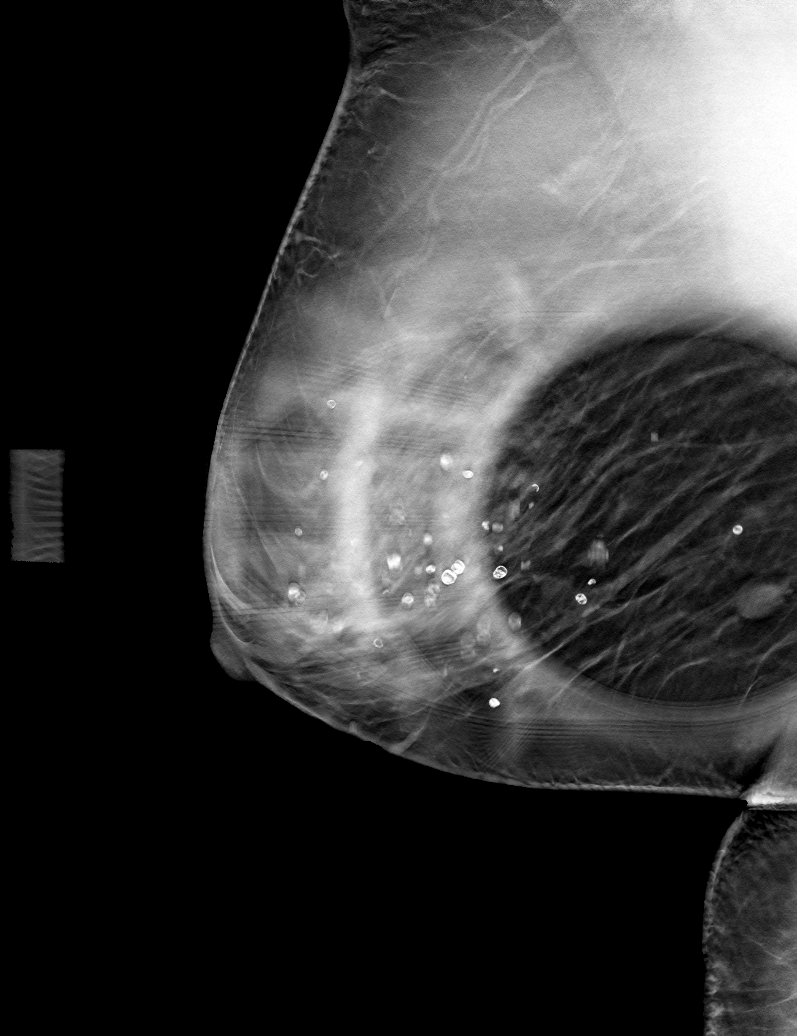

[R CC tomo · tomo slice 27/53.0]
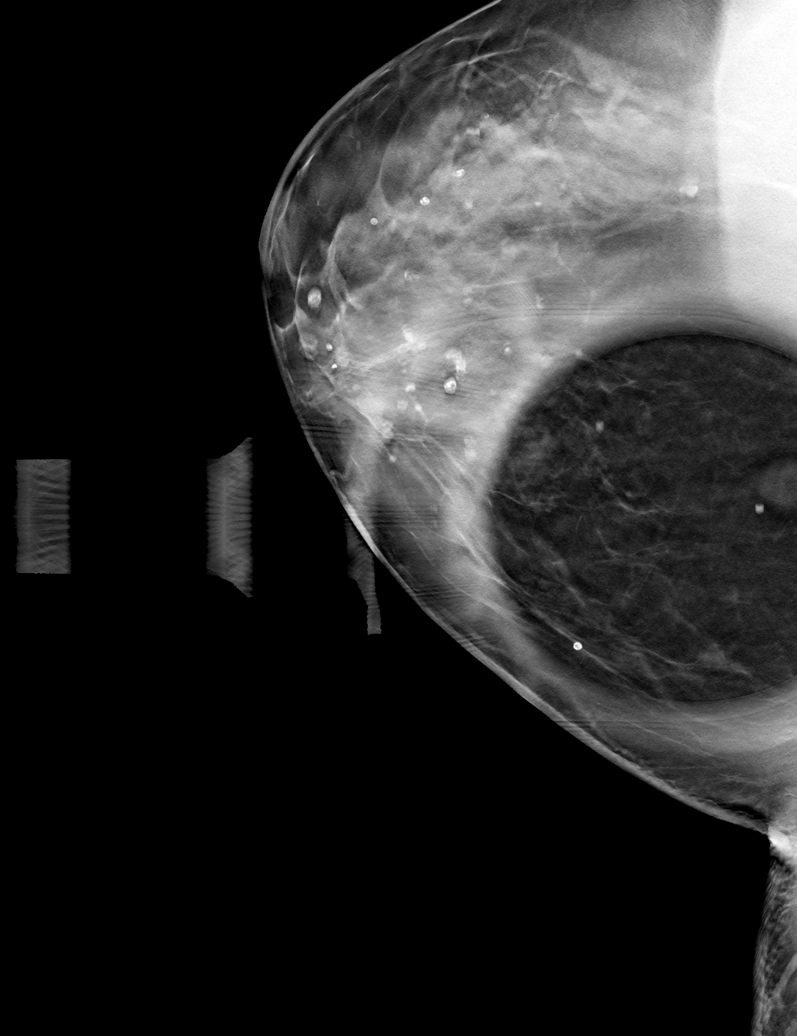

[6 of 14 positions shown; findings below may reference images not displayed]

ACR Breast Density Category b: There are scattered areas of
fibroglandular density.
FINDINGS: Spot compression CC and MLO tomograms were performed of the inner
right breast demonstrating in oval circumscribed mass during
approximately 1 cm. This is partially visualized on the spot
compression CC tomograms due to the far posterior location.

Mammographic images were processed with CAD.

Physical examination of the inner right breast does not reveal any
palpable masses.

Targeted ultrasound of the inner right breast was performed
demonstrating a cyst at 3 o'clock 6 cm from nipple measuring 1.1 x
0.5 x 0.8 cm. This corresponds well with the mass seen at
mammography.
IMPRESSION: Right breast cyst. There is no mammographic evidence of malignancy
in the right breast.

RECOMMENDATION:
Screening mammogram in one year.(Code:JT-[id number]-66A)

I have discussed the findings and recommendations with the patient.
Results were also provided in writing at the conclusion of the
visit. If applicable, a reminder letter will be sent to the patient
regarding the next appointment.

BI-RADS CATEGORY  2: Benign.

## 2017-12-16 NOTE — Progress Notes (Addendum)
Subjective:   Christina [name] is a 77 y.o. female who presents for Medicare Annual (Subsequent) preventive examination.  Review of Systems:  No ROS.  Medicare Wellness Visit. Additional risk factors are reflected in the social history.  Cardiac Risk Factors include: advanced age (>65men, >14 women);hypertension;dyslipidemia;family history of premature cardiovascular disease   Sleep patterns: Sleeps 5-6 hours, difficulty staying asleep.  Home Safety/Smoke Alarms: Feels safe in home. Smoke alarms in place.  Living environment; residence and Firearm Safety: Lives alone in 1 story home.  Seat Belt Safety/Bike Helmet: Wears seat belt.   Female:   Pap-N/A      Mammo-08/22/2017, BI-RADS CATEGORY  1: Negative.       Dexa scan-07/30/16, Osteopenia       CCS-Cologuard completed 02/15/15, negative. Cologuard ordered today.      Objective:     Vitals: BP (!) 148/78 (BP Location: Left Arm, Patient Position: Sitting, Cuff Size: Normal)   Pulse 65   Ht 5\' 3"  (1.6 m)   Wt 173 lb (78.5 kg)   SpO2 98%   BMI 30.65 kg/m   Body mass index is 30.65 kg/m.  Advanced Directives 12/17/2017 06/11/2017 06/03/2017 10/05/2016 02/04/2015  Does Patient Have a Medical Advance Directive? Yes Yes Yes Yes Yes  Type of Paramedic of [place];Living will [place];Living will [place];Living will [place];Living will [place];Living will  Does patient want to make changes to medical advance directive? - No - Patient declined No - Patient declined - Yes - information given  Copy of [place] in Chart? No - copy requested No - copy requested No - copy requested No - copy requested No - copy requested    Tobacco Social History   Tobacco Use  Smoking Status Former Smoker  . Packs/day: 1.00  . Years: 30.00  . Pack years: 30.00  . Types: Cigarettes  . Last attempt to quit: 08/27/1990  . Years  since quitting: 27.3  Smokeless Tobacco Never Used     Counseling given: Not Answered    Past Medical History:  Diagnosis Date  . Arthritis   . Asthma    a little?  . Cervical cancer screening 01/31/2012  . Chicken pox as a child  . Depression with anxiety 06/07/2009   Qualifier: Diagnosis of  By: [name] MD, [name]    . Dermatitis of external ear 07/11/2012  . Dysphagia 01/31/2012  . Hiatal hernia with gastroesophageal reflux 10/26/2010   Qualifier: Diagnosis of  By: [name] MD, [name]    . Hyperglycemia 06/21/2013  . Hyperlipidemia, mixed 10/16/2015  . Hypertension   . Left hip pain 07/10/2015  . Lesion of breast 12/03/2016   benign; resolved  . Low back pain 05/29/2015  . Measles as a child  . Mumps as a child  . Neck pain on left side 06/21/2013  . Osteopenia 01/03/2017  . Overweight (BMI 25.0-29.9) 10/07/2008   Qualifier: Diagnosis of  By: [name] MD, [name]    . Palpitations   . Prediabetes   . RUQ pain 05/29/2015  . Spinal stenosis   . Umbilical hernia   . Urinary frequency 09/28/2011  . Vertigo    Past Surgical History:  Procedure Laterality Date  . BREAST BIOPSY Right 1980   BREAST EXCISIONAL BIOPSY  . BREAST BIOPSY Left 1970   BREAST EXCISIONAL BIOPSY  . BREAST CYST ASPIRATION    . CYSTECTOMY     abdomen  .  EYE SURGERY Bilateral    cataract  . INSERTION OF MESH N/A 06/11/2017   Procedure: INSERTION OF MESH;  Surgeon: Rolm Bookbinder, MD;  Location: [facility];  Service: General;  Laterality: N/A;  BILATERAL TAP BLOCK  . LAPAROSCOPIC ASSISTED VENTRAL HERNIA REPAIR  06/11/2017   w/mesh  . TUBAL LIGATION  1970  . VENTRAL HERNIA REPAIR N/A 06/11/2017   Procedure: LAPAROSCOPIC VENTRAL HERNIA REPAIR WITH MESH ERAS PATHWAY;  Surgeon: Rolm Bookbinder, MD;  Location: [facility];  Service: General;  Laterality: N/A;  BILATERAL TAP BLOCK   Family History  Problem Relation Age of Onset  . Hypertension Mother   . Anxiety disorder Mother   . Ovarian cancer  Mother 60       lung- smoker, ovarian  . Lung cancer Mother        smoker  . Arthritis Mother   . Hyperlipidemia Mother   . Alcohol abuse Father   . Diabetes Sister   . Hypertension Sister   . Ovarian cancer Sister   . Arthritis Sister   . Depression Sister   . Hyperlipidemia Sister   . Other Maternal Grandmother        pacemaker  . Multiple sclerosis Maternal Grandfather        ?  [name] Arthritis Brother   . COPD Brother   . Heart attack Brother   . Hyperlipidemia Brother   . Stroke Brother   . Asthma Sister   . Depression Sister   . Hyperlipidemia Sister   . Asthma Sister   . Depression Sister   . Asthma Paternal Uncle    Social History   Socioeconomic History  . Marital status: Widowed    Spouse name: Not on file  . Number of children: Not on file  . Years of education: Not on file  . Highest education level: Not on file  Occupational History  . Occupation: retired  Scientific laboratory technician  . Financial resource strain: Not on file  . Food insecurity:    Worry: Not on file    Inability: Not on file  . Transportation needs:    Medical: Not on file    Non-medical: Not on file  Tobacco Use  . Smoking status: Former Smoker    Packs/day: 1.00    Years: 30.00    Pack years: 30.00    Types: Cigarettes    Last attempt to quit: 08/27/1990    Years since quitting: 27.3  . Smokeless tobacco: Never Used  Substance and Sexual Activity  . Alcohol use: No  . Drug use: No  . Sexual activity: Never    Comment: lives alone, no dietary restrictions  Lifestyle  . Physical activity:    Days per week: Not on file    Minutes per session: Not on file  . Stress: Not on file  Relationships  . Social connections:    Talks on phone: Not on file    Gets together: Not on file    Attends religious service: Not on file    Active member of club or organization: Not on file    Attends meetings of clubs or organizations: Not on file    Relationship status: Not on file  Other Topics Concern    . Not on file  Social History Narrative   Retired. Lives alone.    Attended some business college.    Former smoker.    Smoke alarm in the home. Wears seat b[place].    Wears dentures.    Feels  safe in her relationships.        Outpatient Encounter Medications as of 12/17/2017  Medication Sig  . fish oil-omega-3 fatty acids 1000 MG capsule Take 1 g by mouth daily.    [name] losartan (COZAAR) 50 MG tablet TAKE 1 TABLET BY MOUTH EVERY DAY  . magnesium gluconate (MAGONATE) 500 MG tablet Take 500 mg by mouth daily.  . polyethylene glycol powder (GLYCOLAX/MIRALAX) powder Take 17 g by mouth daily. (Patient taking differently: Take 17 g by mouth daily as needed for moderate constipation. )  . Probiotic Product (PROBIOTIC-10 PO) Take 1 capsule by mouth daily.   [name] triamcinolone cream (KENALOG) 0.1 % Apply topically 2 (two) times daily. (Patient taking differently: Apply 1 application topically 2 (two) times daily as needed (in ears). )  . Vitamin D, Ergocalciferol, (DRISDOL) 50000 units CAPS capsule Take 1 capsule (50,000 Units total) by mouth every 7 (seven) days.   No facility-administered encounter medications on file as of 12/17/2017.     Activities of Daily Living In your present state of health, do you have any difficulty performing the following activities: 12/17/2017 06/11/2017  Hearing? N -  Vision? N -  Difficulty concentrating or making decisions? N -  Comment - -  Walking or climbing stairs? Y -  Comment Pain in left knee occasionally -  Dressing or bathing? N -  Doing errands, shopping? N N  Preparing Food and eating ? N -  Using the Toilet? N -  In the past six months, have you accidently leaked urine? N -  Do you have problems with loss of bowel control? N -  Managing your Medications? N -  Managing your Finances? N -  Housekeeping or managing your Housekeeping? N -  Some recent data might be hidden    Patient Care Team: Ma Hillock, DO as PCP - General (Family  Medicine) Rolm Bookbinder, MD as Consulting Physician (General Surgery) Monna Fam, MD as Consulting Physician (Ophthalmology) Garry Heater, DDS as Consulting Physician (Dentistry) Juanita Craver, MD as Consulting Physician (Gastroenterology)    Assessment:   This is a routine wellness examination for Christina [name].  Exercise Activities and Dietary recommendations Current Exercise Habits: The patient does not participate in regular exercise at present(housework and yard work), Exercise limited by: None identified   Diet (meal preparation, eat out, water intake, caffeinated beverages, dairy products, fruits and vegetables): Drink water and tea.   Breakfast: boiled egg, coffee Lunch: chicken, [place], frozen dinner (healthy) Dinner: protein and vegetables.      Encouraged to continue healthy food options.   Goals    . Patient Stated     Maintain current health by staying active and eating well.     . Weight (lb) < 180 lb (81.6 kg)       Fall Risk Fall Risk  12/17/2017 09/02/2017 10/05/2016 04/05/2016 02/04/2015  Falls in the past year? No No No No No    Depression Screen PHQ 2/9 Scores 12/17/2017 09/02/2017 03/04/2017 10/05/2016  PHQ - 2 Score 0 0 4 0  PHQ- 9 Score - - 11 -     Cognitive Function MMSE - Mini Mental State Exam 10/05/2016  Orientation to time 5  Orientation to Place 5  Registration 3  Attention/ Calculation 5  Recall 3  Language- name 2 objects 2  Language- repeat 1  Language- follow 3 step command 3  Language- read & follow direction 1  Write a sentence 1  Copy design 1  Total score 30  Ad8 score reviewed for issues:  Issues making decisions: no  Less interest in hobbies / activities: no  Repeats questions, stories (family complaining): no  Trouble using ordinary gadgets (microwave, computer, phone): no  Forgets the month or year: no  Mismanaging finances: no  Remembering appts: no  Daily problems with thinking and/or memory: no Ad8 score  is=0          Immunization History  Administered Date(s) Administered  . Influenza Whole 06/07/2009, 05/28/2011, 05/28/2012  . Influenza, High Dose Seasonal PF 05/20/2015  . Influenza,inj,Quad PF,6+ Mos 05/26/2013, 05/31/2014  . Pneumococcal Conjugate-13 05/31/2014  . Pneumococcal Polysaccharide-23 06/07/2009  . Td 01/31/2003  . Tdap 09/28/2011   Declines Shingrix.   Screening Tests Health Maintenance  Topic Date Due  . INFLUENZA VACCINE  05/05/2018 (Originally 03/27/2018)  . Fecal DNA (Cologuard)  02/14/2018  . MAMMOGRAM  08/23/2019  . TETANUS/TDAP  09/27/2021  . DEXA SCAN  Completed  . PNA vac Low Risk Adult  Completed        Plan:     Complete Cologuard after 02/15/2018.  Bring a copy of your living will and/or healthcare power of attorney to your next office visit.  Continue doing brain stimulating activities (puzzles, reading, adult coloring books, staying active) to keep memory sharp.   I have personally reviewed and noted the following in the patient's chart:   . Medical and social history . Use of alcohol, tobacco or illicit drugs  . Current medications and supplements . Functional ability and status . Nutritional status . Physical activity . Advanced directives . List of other physicians . Hospitalizations, surgeries, and ER visits in previous 12 months . Vitals . Screenings to include cognitive, depression, and falls . Referrals and appointments  In addition, I have reviewed and discussed with patient certain preventive protocols, quality metrics, and best practice recommendations. A written personalized care plan for preventive services as well as general preventive health recommendations were provided to patient.     Gerilyn Nestle, RN  12/17/2017   PCP Notes: -BP 148/78, did not take meds today. -Declines Shingrix -F/U with PCP 03/06/18  Medical screening examination/treatment/procedure(s) were performed by non-physician practitioner  and as supervising physician I was immediately available for consultation/collaboration.  I agree with above assessment and plan.  Electronically Signed by: Howard Pouch, DO [facility] [place]

## 2017-12-17 ENCOUNTER — Other Ambulatory Visit: Payer: Self-pay

## 2017-12-17 ENCOUNTER — Ambulatory Visit (INDEPENDENT_AMBULATORY_CARE_PROVIDER_SITE_OTHER): Payer: Medicare Other

## 2017-12-17 VITALS — BP 148/78 | HR 65 | Ht 63.0 in | Wt 173.0 lb

## 2017-12-17 DIAGNOSIS — Z1211 Encounter for screening for malignant neoplasm of colon: Secondary | ICD-10-CM

## 2017-12-17 DIAGNOSIS — Z0000 Encounter for general adult medical examination without abnormal findings: Secondary | ICD-10-CM | POA: Diagnosis not present

## 2017-12-17 NOTE — Patient Instructions (Addendum)
Complete Cologuard after 02/15/2018.  Bring a copy of your living will and/or healthcare power of attorney to your next office visit.  Continue doing brain stimulating activities (puzzles, reading, adult coloring books, staying active) to keep memory sharp.    Health Maintenance, Female Adopting a healthy lifestyle and getting preventive care can go a long way to promote health and wellness. Talk with your health care provider about what schedule of regular examinations is right for you. This is a good chance for you to check in with your provider about disease prevention and staying healthy. In between checkups, there are plenty of things you can do on your own. Experts have done a lot of research about which lifestyle changes and preventive measures are most likely to keep you healthy. Ask your health care provider for more information. Weight and diet Eat a healthy diet  Be sure to include plenty of vegetables, fruits, low-fat dairy products, and lean protein.  Do not eat a lot of foods high in solid fats, added sugars, or salt.  Get regular exercise. This is one of the most important things you can do for your health. ? Most adults should exercise for at least 150 minutes each week. The exercise should increase your heart rate and make you sweat (moderate-intensity exercise). ? Most adults should also do strengthening exercises at least twice a week. This is in addition to the moderate-intensity exercise.  Maintain a healthy weight  Body mass index (BMI) is a measurement that can be used to identify possible weight problems. It estimates body fat based on height and weight. Your health care provider can help determine your BMI and help you achieve or maintain a healthy weight.  For females 76 years of age and older: ? A BMI below 18.5 is considered underweight. ? A BMI of 18.5 to 24.9 is normal. ? A BMI of 25 to 29.9 is considered overweight. ? A BMI of 30 and above is considered  obese.  Watch levels of cholesterol and blood lipids  You should start having your blood tested for lipids and cholesterol at 76 years of age, then have this test every 5 years.  You may need to have your cholesterol levels checked more often if: ? Your lipid or cholesterol levels are high. ? You are older than 76 years of age. ? You are at high risk for heart disease.  Cancer screening Lung Cancer  Lung cancer screening is recommended for adults 36-19 years old who are at high risk for lung cancer because of a history of smoking.  A yearly low-dose CT scan of the lungs is recommended for people who: ? Currently smoke. ? Have quit within the past 15 years. ? Have at least a 30-pack-year history of smoking. A pack year is smoking an average of one pack of cigarettes a day for 1 year.  Yearly screening should continue until it has been 15 years since you quit.  Yearly screening should stop if you develop a health problem that would prevent you from having lung cancer treatment.  Breast Cancer  Practice breast self-awareness. This means understanding how your breasts normally appear and feel.  It also means doing regular breast self-exams. Let your health care provider know about any changes, no matter how small.  If you are in your 20s or 30s, you should have a clinical breast exam (CBE) by a health care provider every 1-3 years as part of a regular health exam.  If you are 40  or older, have a CBE every year. Also consider having a breast X-ray (mammogram) every year.  If you have a family history of breast cancer, talk to your health care provider about genetic screening.  If you are at high risk for breast cancer, talk to your health care provider about having an MRI and a mammogram every year.  Breast cancer gene (BRCA) assessment is recommended for women who have family members with BRCA-related cancers. BRCA-related cancers  include: ? Breast. ? Ovarian. ? Tubal. ? Peritoneal cancers.  Results of the assessment will determine the need for genetic counseling and BRCA1 and BRCA2 testing.  Cervical Cancer Your health care provider may recommend that you be screened regularly for cancer of the pelvic organs (ovaries, uterus, and vagina). This screening involves a pelvic examination, including checking for microscopic changes to the surface of your cervix (Pap test). You may be encouraged to have this screening done every 3 years, beginning at age 66.  For women ages 52-65, health care providers may recommend pelvic exams and Pap testing every 3 years, or they may recommend the Pap and pelvic exam, combined with testing for human papilloma virus (HPV), every 5 years. Some types of HPV increase your risk of cervical cancer. Testing for HPV may also be done on women of any age with unclear Pap test results.  Other health care providers may not recommend any screening for nonpregnant women who are considered low risk for pelvic cancer and who do not have symptoms. Ask your health care provider if a screening pelvic exam is right for you.  If you have had past treatment for cervical cancer or a condition that could lead to cancer, you need Pap tests and screening for cancer for at least 20 years after your treatment. If Pap tests have been discontinued, your risk factors (such as having a new sexual partner) need to be reassessed to determine if screening should resume. Some women have medical problems that increase the chance of getting cervical cancer. In these cases, your health care provider may recommend more frequent screening and Pap tests.  Colorectal Cancer  This type of cancer can be detected and often prevented.  Routine colorectal cancer screening usually begins at 76 years of age and continues through 76 years of age.  Your health care provider may recommend screening at an earlier age if you have risk factors  for colon cancer.  Your health care provider may also recommend using home test kits to check for hidden blood in the stool.  A small camera at the end of a tube can be used to examine your colon directly (sigmoidoscopy or colonoscopy). This is done to check for the earliest forms of colorectal cancer.  Routine screening usually begins at age 61.  Direct examination of the colon should be repeated every 5-10 years through 76 years of age. However, you may need to be screened more often if early forms of precancerous polyps or small growths are found.  Skin Cancer  Check your skin from head to toe regularly.  Tell your health care provider about any new moles or changes in moles, especially if there is a change in a mole's shape or color.  Also tell your health care provider if you have a mole that is larger than the size of a pencil eraser.  Always use sunscreen. Apply sunscreen liberally and repeatedly throughout the day.  Protect yourself by wearing long sleeves, pants, a wide-brimmed hat, and sunglasses whenever you are  outside.  Heart disease, diabetes, and high blood pressure  High blood pressure causes heart disease and increases the risk of stroke. High blood pressure is more likely to develop in: ? People who have blood pressure in the high end of the normal range (130-139/85-89 mm Hg). ? People who are overweight or obese. ? People who are African American.  If you are 87-76 years of age, have your blood pressure checked every 3-5 years. If you are 52 years of age or older, have your blood pressure checked every year. You should have your blood pressure measured twice-once when you are at a hospital or clinic, and once when you are not at a hospital or clinic. Record the average of the two measurements. To check your blood pressure when you are not at a hospital or clinic, you can use: ? An automated blood pressure machine at a pharmacy. ? A home blood pressure monitor.  If  you are between 28 years and 76 years old, ask your health care provider if you should take aspirin to prevent strokes.  Have regular diabetes screenings. This involves taking a blood sample to check your fasting blood sugar level. ? If you are at a normal weight and have a low risk for diabetes, have this test once every three years after 76 years of age. ? If you are overweight and have a high risk for diabetes, consider being tested at a younger age or more often. Preventing infection Hepatitis B  If you have a higher risk for hepatitis B, you should be screened for this virus. You are considered at high risk for hepatitis B if: ? You were born in a country where hepatitis B is common. Ask your health care provider which countries are considered high risk. ? Your parents were born in a high-risk country, and you have not been immunized against hepatitis B (hepatitis B vaccine). ? You have HIV or AIDS. ? You use needles to inject street drugs. ? You live with someone who has hepatitis B. ? You have had sex with someone who has hepatitis B. ? You get hemodialysis treatment. ? You take certain medicines for conditions, including cancer, organ transplantation, and autoimmune conditions.  Hepatitis C  Blood testing is recommended for: ? Everyone born from [birth year] through 1964. ? Anyone with known risk factors for hepatitis C.  Sexually transmitted infections (STIs)  You should be screened for sexually transmitted infections (STIs) including gonorrhea and chlamydia if: ? You are sexually active and are younger than 76 years of age. ? You are older than 76 years of age and your health care provider tells you that you are at risk for this type of infection. ? Your sexual activity has changed since you were last screened and you are at an increased risk for chlamydia or gonorrhea. Ask your health care provider if you are at risk.  If you do not have HIV, but are at risk, it may be recommended  that you take a prescription medicine daily to prevent HIV infection. This is called pre-exposure prophylaxis (PrEP). You are considered at risk if: ? You are sexually active and do not regularly use condoms or know the HIV status of your partner(s). ? You take drugs by injection. ? You are sexually active with a partner who has HIV.  Talk with your health care provider about whether you are at high risk of being infected with HIV. If you choose to begin PrEP, you should first be tested  for HIV. You should then be tested every 3 months for as long as you are taking PrEP. Pregnancy  If you are premenopausal and you may become pregnant, ask your health care provider about preconception counseling.  If you may become pregnant, take 400 to 800 micrograms (mcg) of folic acid every day.  If you want to prevent pregnancy, talk to your health care provider about birth control (contraception). Osteoporosis and menopause  Osteoporosis is a disease in which the bones lose minerals and strength with aging. This can result in serious bone fractures. Your risk for osteoporosis can be identified using a bone density scan.  If you are 45 years of age or older, or if you are at risk for osteoporosis and fractures, ask your health care provider if you should be screened.  Ask your health care provider whether you should take a calcium or vitamin D supplement to lower your risk for osteoporosis.  Menopause may have certain physical symptoms and risks.  Hormone replacement therapy may reduce some of these symptoms and risks. Talk to your health care provider about whether hormone replacement therapy is right for you. Follow these instructions at home:  Schedule regular health, dental, and eye exams.  Stay current with your immunizations.  Do not use any tobacco products including cigarettes, chewing tobacco, or electronic cigarettes.  If you are pregnant, do not drink alcohol.  If you are  breastfeeding, limit how much and how often you drink alcohol.  Limit alcohol intake to no more than 1 drink per day for nonpregnant women. One drink equals 12 ounces of beer, 5 ounces of wine, or 1 ounces of hard liquor.  Do not use street drugs.  Do not share needles.  Ask your health care provider for help if you need support or information about quitting drugs.  Tell your health care provider if you often feel depressed.  Tell your health care provider if you have ever been abused or do not feel safe at home. This information is not intended to replace advice given to you by your health care provider. Make sure you discuss any questions you have with your health care provider. Document Released: 02/26/2011 Document Revised: 01/19/2016 Document Reviewed: 05/17/2015 Elsevier Interactive Patient Education  [employer].

## 2018-01-21 ENCOUNTER — Ambulatory Visit (INDEPENDENT_AMBULATORY_CARE_PROVIDER_SITE_OTHER): Payer: Medicare Other | Admitting: Physician Assistant

## 2018-01-21 ENCOUNTER — Encounter (INDEPENDENT_AMBULATORY_CARE_PROVIDER_SITE_OTHER): Payer: Self-pay

## 2018-03-03 ENCOUNTER — Telehealth: Payer: Self-pay | Admitting: *Deleted

## 2018-03-03 DIAGNOSIS — R7303 Prediabetes: Secondary | ICD-10-CM

## 2018-03-03 DIAGNOSIS — I10 Essential (primary) hypertension: Secondary | ICD-10-CM

## 2018-03-03 NOTE — Telephone Encounter (Signed)
Lab ordered placed for her--> make lab appt.  She has a provider appt this week.

## 2018-03-03 NOTE — Telephone Encounter (Signed)
Spoke with patient - scheduled lab appt.

## 2018-03-03 NOTE — Telephone Encounter (Signed)
Copied from [place] [id number]. Topic: General - Other >> Mar 03, 2018 12:57 PM Synthia Innocent wrote: Reason for CRM: Requesting CPE labs prior to appt this week, scheduled for CPE on 03/06/18.

## 2018-03-04 ENCOUNTER — Other Ambulatory Visit (INDEPENDENT_AMBULATORY_CARE_PROVIDER_SITE_OTHER): Payer: Medicare Other

## 2018-03-04 DIAGNOSIS — I10 Essential (primary) hypertension: Secondary | ICD-10-CM | POA: Diagnosis not present

## 2018-03-04 DIAGNOSIS — R7303 Prediabetes: Secondary | ICD-10-CM | POA: Diagnosis not present

## 2018-03-04 LAB — COMPREHENSIVE METABOLIC PANEL
ALT: 10 U/L (ref 0–35)
AST: 15 U/L (ref 0–37)
Albumin: 4 g/dL (ref 3.5–5.2)
Alkaline Phosphatase: 60 U/L (ref 39–117)
BUN: 16 mg/dL (ref 6–23)
CO2: 30 mEq/L (ref 19–32)
Calcium: 10 mg/dL (ref 8.4–10.5)
Chloride: 106 mEq/L (ref 96–112)
Creatinine, Ser: 0.78 mg/dL (ref 0.40–1.20)
GFR: 76.28 mL/min (ref >60.00)
Glucose, Bld: 97 mg/dL (ref 70–99)
Potassium: 4.2 mEq/L (ref 3.5–5.1)
Sodium: 142 mEq/L (ref 135–145)
Total Bilirubin: 0.9 mg/dL (ref 0.2–1.2)
Total Protein: 6.3 g/dL (ref 6.0–8.3)

## 2018-03-04 LAB — CBC WITH DIFFERENTIAL/PLATELET
Basophils Absolute: 0.1 10*3/uL (ref 0.0–0.1)
Basophils Relative: 0.9 % (ref 0.0–3.0)
Eosinophils Absolute: 0.1 10*3/uL (ref 0.0–0.7)
Eosinophils Relative: 2 % (ref 0.0–5.0)
HCT: 43.4 % (ref 36.0–46.0)
Hemoglobin: 14.5 g/dL (ref 12.0–15.0)
Lymphocytes Relative: 47 % — ABNORMAL HIGH (ref 12.0–46.0)
Lymphs Abs: 2.9 10*3/uL (ref 0.7–4.0)
MCHC: 33.5 g/dL (ref 30.0–36.0)
MCV: 97 fl (ref 78.0–100.0)
Monocytes Absolute: 0.6 10*3/uL (ref 0.1–1.0)
Monocytes Relative: 9.2 % (ref 3.0–12.0)
Neutro Abs: 2.5 10*3/uL (ref 1.4–7.7)
Neutrophils Relative %: 40.9 % — ABNORMAL LOW (ref 43.0–77.0)
Platelets: 229 10*3/uL (ref 150.0–400.0)
RBC: 4.47 Mil/uL (ref 3.87–5.11)
RDW: 14.1 % (ref 11.5–15.5)
WBC: 6.1 10*3/uL (ref 4.0–10.5)

## 2018-03-04 LAB — TSH: TSH: 2.34 u[IU]/mL (ref 0.35–4.50)

## 2018-03-04 LAB — LIPID PANEL
Cholesterol: 183 mg/dL (ref 0–200)
HDL: 64.4 mg/dL (ref >39.00)
LDL Cholesterol: 103 mg/dL — ABNORMAL HIGH (ref 0–99)
NonHDL: 118.78
Total CHOL/HDL Ratio: 3
Triglycerides: 81 mg/dL (ref 0.0–149.0)
VLDL: 16.2 mg/dL (ref 0.0–40.0)

## 2018-03-04 LAB — HEMOGLOBIN A1C: Hgb A1c MFr Bld: 5.9 % (ref 4.6–6.5)

## 2018-03-05 ENCOUNTER — Telehealth: Payer: Self-pay | Admitting: Family Medicine

## 2018-03-05 NOTE — Telephone Encounter (Signed)
Please inform patient the following information: Her labs all looked good. Her a1c increased mildly to 5.9, but her fasting glucose was great. We will continue to monitor and screen for diabetes with physicals. She did have some very mild changes on certain types of white blood cell levels.  Again these are very mild, can mean absolutely nothing, but would want to make sure they agree to return to normal levels and do not worsen over time.  Will recheck her CBC in 3 months with provider appointment, please schedule her.

## 2018-03-05 NOTE — Telephone Encounter (Signed)
Left detailed message with results and instructions on patient voice mail per DPR 

## 2018-03-06 ENCOUNTER — Encounter: Payer: Self-pay | Admitting: Family Medicine

## 2018-03-06 ENCOUNTER — Ambulatory Visit: Payer: Medicare Other | Admitting: Family Medicine

## 2018-03-06 VITALS — BP 121/75 | HR 65 | Temp 98.1°F | Resp 16 | Ht 64.5 in | Wt 177.0 lb

## 2018-03-06 DIAGNOSIS — Z0000 Encounter for general adult medical examination without abnormal findings: Secondary | ICD-10-CM

## 2018-03-06 DIAGNOSIS — Z1231 Encounter for screening mammogram for malignant neoplasm of breast: Secondary | ICD-10-CM

## 2018-03-06 DIAGNOSIS — R7309 Other abnormal glucose: Secondary | ICD-10-CM | POA: Insufficient documentation

## 2018-03-06 DIAGNOSIS — Z1211 Encounter for screening for malignant neoplasm of colon: Secondary | ICD-10-CM | POA: Diagnosis not present

## 2018-03-06 DIAGNOSIS — E559 Vitamin D deficiency, unspecified: Secondary | ICD-10-CM

## 2018-03-06 DIAGNOSIS — Z1239 Encounter for other screening for malignant neoplasm of breast: Secondary | ICD-10-CM

## 2018-03-06 DIAGNOSIS — M8580 Other specified disorders of bone density and structure, unspecified site: Secondary | ICD-10-CM | POA: Diagnosis not present

## 2018-03-06 DIAGNOSIS — I10 Essential (primary) hypertension: Secondary | ICD-10-CM | POA: Diagnosis not present

## 2018-03-06 MED ORDER — LOSARTAN POTASSIUM 50 MG PO TABS: 50.0000 mg | ORAL_TABLET | Freq: Every day | ORAL | 1 refills | Status: DC

## 2018-03-06 NOTE — Patient Instructions (Signed)
Vit d [id number] daily with food.   Health Maintenance, Female Adopting a healthy lifestyle and getting preventive care can go a long way to promote health and wellness. Talk with your health care provider about what schedule of regular examinations is right for you. This is a good chance for you to check in with your provider about disease prevention and staying healthy. In between checkups, there are plenty of things you can do on your own. Experts have done a lot of research about which lifestyle changes and preventive measures are most likely to keep you healthy. Ask your health care provider for more information. Weight and diet Eat a healthy diet  Be sure to include plenty of vegetables, fruits, low-fat dairy products, and lean protein.  Do not eat a lot of foods high in solid fats, added sugars, or salt.  Get regular exercise. This is one of the most important things you can do for your health. ? Most adults should exercise for at least 150 minutes each week. The exercise should increase your heart rate and make you sweat (moderate-intensity exercise). ? Most adults should also do strengthening exercises at least twice a week. This is in addition to the moderate-intensity exercise.  Maintain a healthy weight  Body mass index (BMI) is a measurement that can be used to identify possible weight problems. It estimates body fat based on height and weight. Your health care provider can help determine your BMI and help you achieve or maintain a healthy weight.  For females 57 years of age and older: ? A BMI below 18.5 is considered underweight. ? A BMI of 18.5 to 24.9 is normal. ? A BMI of 25 to 29.9 is considered overweight. ? A BMI of 30 and above is considered obese.  Watch levels of cholesterol and blood lipids  You should start having your blood tested for lipids and cholesterol at 76 years of age, then have this test every 5 years.  You may need to have your cholesterol levels  checked more often if: ? Your lipid or cholesterol levels are high. ? You are older than 76 years of age. ? You are at high risk for heart disease.  Cancer screening Lung Cancer  Lung cancer screening is recommended for adults 69-36 years old who are at high risk for lung cancer because of a history of smoking.  A yearly low-dose CT scan of the lungs is recommended for people who: ? Currently smoke. ? Have quit within the past 15 years. ? Have at least a 30-pack-year history of smoking. A pack year is smoking an average of one pack of cigarettes a day for 1 year.  Yearly screening should continue until it has been 15 years since you quit.  Yearly screening should stop if you develop a health problem that would prevent you from having lung cancer treatment.  Breast Cancer  Practice breast self-awareness. This means understanding how your breasts normally appear and feel.  It also means doing regular breast self-exams. Let your health care provider know about any changes, no matter how small.  If you are in your 20s or 30s, you should have a clinical breast exam (CBE) by a health care provider every 1-3 years as part of a regular health exam.  If you are 85 or older, have a CBE every year. Also consider having a breast X-ray (mammogram) every year.  If you have a family history of breast cancer, talk to your health care provider about genetic  screening.  If you are at high risk for breast cancer, talk to your health care provider about having an MRI and a mammogram every year.  Breast cancer gene (BRCA) assessment is recommended for women who have family members with BRCA-related cancers. BRCA-related cancers include: ? Breast. ? Ovarian. ? Tubal. ? Peritoneal cancers.  Results of the assessment will determine the need for genetic counseling and BRCA1 and BRCA2 testing.  Cervical Cancer Your health care provider may recommend that you be screened regularly for cancer of the  pelvic organs (ovaries, uterus, and vagina). This screening involves a pelvic examination, including checking for microscopic changes to the surface of your cervix (Pap test). You may be encouraged to have this screening done every 3 years, beginning at age 21.  For women ages 30-65, health care providers may recommend pelvic exams and Pap testing every 3 years, or they may recommend the Pap and pelvic exam, combined with testing for human papilloma virus (HPV), every 5 years. Some types of HPV increase your risk of cervical cancer. Testing for HPV may also be done on women of any age with unclear Pap test results.  Other health care providers may not recommend any screening for nonpregnant women who are considered low risk for pelvic cancer and who do not have symptoms. Ask your health care provider if a screening pelvic exam is right for you.  If you have had past treatment for cervical cancer or a condition that could lead to cancer, you need Pap tests and screening for cancer for at least 20 years after your treatment. If Pap tests have been discontinued, your risk factors (such as having a new sexual partner) need to be reassessed to determine if screening should resume. Some women have medical problems that increase the chance of getting cervical cancer. In these cases, your health care provider may recommend more frequent screening and Pap tests.  Colorectal Cancer  This type of cancer can be detected and often prevented.  Routine colorectal cancer screening usually begins at 76 years of age and continues through 75 years of age.  Your health care provider may recommend screening at an earlier age if you have risk factors for colon cancer.  Your health care provider may also recommend using home test kits to check for hidden blood in the stool.  A small camera at the end of a tube can be used to examine your colon directly (sigmoidoscopy or colonoscopy). This is done to check for the  earliest forms of colorectal cancer.  Routine screening usually begins at age 50.  Direct examination of the colon should be repeated every 5-10 years through 75 years of age. However, you may need to be screened more often if early forms of precancerous polyps or small growths are found.  Skin Cancer  Check your skin from head to toe regularly.  Tell your health care provider about any new moles or changes in moles, especially if there is a change in a mole's shape or color.  Also tell your health care provider if you have a mole that is larger than the size of a pencil eraser.  Always use sunscreen. Apply sunscreen liberally and repeatedly throughout the day.  Protect yourself by wearing long sleeves, pants, a wide-brimmed hat, and sunglasses whenever you are outside.  Heart disease, diabetes, and high blood pressure  High blood pressure causes heart disease and increases the risk of stroke. High blood pressure is more likely to develop in: ? People who   have blood pressure in the high end of the normal range (130-139/85-89 mm Hg). ? People who are overweight or obese. ? People who are African American.  If you are 19-29 years of age, have your blood pressure checked every 3-5 years. If you are 30 years of age or older, have your blood pressure checked every year. You should have your blood pressure measured twice-once when you are at a hospital or clinic, and once when you are not at a hospital or clinic. Record the average of the two measurements. To check your blood pressure when you are not at a hospital or clinic, you can use: ? An automated blood pressure machine at a pharmacy. ? A home blood pressure monitor.  If you are between 65 years and 63 years old, ask your health care provider if you should take aspirin to prevent strokes.  Have regular diabetes screenings. This involves taking a blood sample to check your fasting blood sugar level. ? If you are at a normal weight and  have a low risk for diabetes, have this test once every three years after 76 years of age. ? If you are overweight and have a high risk for diabetes, consider being tested at a younger age or more often. Preventing infection Hepatitis B  If you have a higher risk for hepatitis B, you should be screened for this virus. You are considered at high risk for hepatitis B if: ? You were born in a country where hepatitis B is common. Ask your health care provider which countries are considered high risk. ? Your parents were born in a high-risk country, and you have not been immunized against hepatitis B (hepatitis B vaccine). ? You have HIV or AIDS. ? You use needles to inject street drugs. ? You live with someone who has hepatitis B. ? You have had sex with someone who has hepatitis B. ? You get hemodialysis treatment. ? You take certain medicines for conditions, including cancer, organ transplantation, and autoimmune conditions.  Hepatitis C  Blood testing is recommended for: ? Everyone born from [birth year] through 1964. ? Anyone with known risk factors for hepatitis C.  Sexually transmitted infections (STIs)  You should be screened for sexually transmitted infections (STIs) including gonorrhea and chlamydia if: ? You are sexually active and are younger than 76 years of age. ? You are older than 76 years of age and your health care provider tells you that you are at risk for this type of infection. ? Your sexual activity has changed since you were last screened and you are at an increased risk for chlamydia or gonorrhea. Ask your health care provider if you are at risk.  If you do not have HIV, but are at risk, it may be recommended that you take a prescription medicine daily to prevent HIV infection. This is called pre-exposure prophylaxis (PrEP). You are considered at risk if: ? You are sexually active and do not regularly use condoms or know the HIV status of your partner(s). ? You take drugs by  injection. ? You are sexually active with a partner who has HIV.  Talk with your health care provider about whether you are at high risk of being infected with HIV. If you choose to begin PrEP, you should first be tested for HIV. You should then be tested every 3 months for as long as you are taking PrEP. Pregnancy  If you are premenopausal and you may become pregnant, ask your health care provider  about preconception counseling.  If you may become pregnant, take 400 to 800 micrograms (mcg) of folic acid every day.  If you want to prevent pregnancy, talk to your health care provider about birth control (contraception). Osteoporosis and menopause  Osteoporosis is a disease in which the bones lose minerals and strength with aging. This can result in serious bone fractures. Your risk for osteoporosis can be identified using a bone density scan.  If you are 6 years of age or older, or if you are at risk for osteoporosis and fractures, ask your health care provider if you should be screened.  Ask your health care provider whether you should take a calcium or vitamin D supplement to lower your risk for osteoporosis.  Menopause may have certain physical symptoms and risks.  Hormone replacement therapy may reduce some of these symptoms and risks. Talk to your health care provider about whether hormone replacement therapy is right for you. Follow these instructions at home:  Schedule regular health, dental, and eye exams.  Stay current with your immunizations.  Do not use any tobacco products including cigarettes, chewing tobacco, or electronic cigarettes.  If you are pregnant, do not drink alcohol.  If you are breastfeeding, limit how much and how often you drink alcohol.  Limit alcohol intake to no more than 1 drink per day for nonpregnant women. One drink equals 12 ounces of beer, 5 ounces of wine, or 1 ounces of hard liquor.  Do not use street drugs.  Do not share needles.  Ask  your health care provider for help if you need support or information about quitting drugs.  Tell your health care provider if you often feel depressed.  Tell your health care provider if you have ever been abused or do not feel safe at home. This information is not intended to replace advice given to you by your health care provider. Make sure you discuss any questions you have with your health care provider. Document Released: 02/26/2011 Document Revised: 01/19/2016 Document Reviewed: 05/17/2015 [employer] Interactive Patient Education  [employer].

## 2018-03-06 NOTE — Progress Notes (Signed)
Patient ID: Christina [name], female  DOB: 1941/02/03, 77 y.o.   MRN: [id number] Patient Care Team    Relationship Specialty Notifications Start End  Ma Hillock, DO PCP - General Family Medicine  09/02/17   Rolm Bookbinder, MD Consulting Physician General Surgery  10/05/16   Monna Fam, MD Consulting Physician Ophthalmology  10/05/16   Garry Heater, DDS Consulting Physician Dentistry  10/05/16   Juanita Craver, MD Consulting Physician Gastroenterology  09/02/17     Chief Complaint  Patient presents with  . well visit    Subjective:  Christina [name] is a 77 y.o.  female present for physical. All past medical history, surgical history, allergies, family history, immunizations, medications and social history were updated and entered in the electronic medical record today. All recent labs, ED visits and hospitalizations within the last year were reviewed.  Health maintenance:  Colonoscopy: Colonoscopy greater than 10 years ago completed by Dr. Collene Mares.  Patient would like to complete a Cologuard.  This had been ordered prior but not completed.  She would like to complete this now. Mammogram: Completed 08/22/2017 within normal limits. Cervical cancer screening: > 65, N/A Immunizations: Tetanus UTD 09/28/2011, recommended flu shot yearly, pneumonia series completed.  Declined Shingrix. Infectious disease screening: N/A   Hypertension/overweight/HLD: Pt reports compliance with losartan 50 mg QD. She has concerns over warnings of cancer causing agents in the medication.  Blood pressures ranges at home are normal. Patient denies chest pain, shortness of breath, dizziness or lower extremity edema.  Pt does not take a  daily baby ASA. Pt is not prescribed statin. She does take fish oil supplement. Mild hyperlipidemia 2015. BMP: 06/12/2017, mildly elevated glucose, otherwise normal.  CBC: 06/03/2017 WNL TSH: 01/03/2017 WNL A1c: 5.7 02/2017 Lipid: 01/03/2017 WNL Diet: low sodium Exercise:  routine RF: HTN, mild HLD, BMI > 25 former smoker  Vit d deficiency/osteopenia: Pt reports she is not currently taking a vitamin D supplement.  He did receive 50,000 units once weekly for 12 weeks a few months ago.  Elevated A1c: Prior A1c: 6.0--> 5.7--> 5.9.  She reports she does worry about advancing to diabetes.  She has been eating more ice cream in the evenings than usual.  She does walk routinely. Depression screen [facility] 2/9 03/06/2018 12/17/2017 09/02/2017 03/04/2017 10/05/2016  Decreased Interest 0 0 0 1 0  Down, Depressed, Hopeless 0 0 0 3 0  PHQ - 2 Score 0 0 0 4 0  Altered sleeping - - - 2 -  Tired, decreased energy - - - 2 -  Change in appetite - - - 2 -  Feeling bad or failure about yourself  - - - 0 -  Trouble concentrating - - - 1 -  Moving slowly or fidgety/restless - - - 0 -  Suicidal thoughts - - - 0 -  PHQ-9 Score - - - 11 -   No flowsheet data found.  Current Exercise Habits: The patient does not participate in regular exercise at present   Fall Risk  03/06/2018 12/17/2017 09/02/2017 10/05/2016 04/05/2016  Falls in the past year? _0      Immunization History  Administered Date(s) Administered  . Influenza Whole 06/07/2009, 05/28/2011, 05/28/2012  . Influenza, High Dose Seasonal PF 05/20/2015  . Influenza,inj,Quad PF,6+ Mos 05/26/2013, 05/31/2014  . Pneumococcal Conjugate-13 05/31/2014  . Pneumococcal Polysaccharide-23 06/07/2009  . Td 01/31/2003  . Tdap 09/28/2011    No exam data present  Past Medical History:  Diagnosis Date  . Arthritis   . Asthma    a little?  . Cervical cancer screening 01/31/2012  . Chicken pox as a child  . Depression with anxiety 06/07/2009   Qualifier: Diagnosis of  By: [name] MD, [name]    . Dermatitis of external ear 07/11/2012  . Dysphagia 01/31/2012  . Hiatal hernia with gastroesophageal reflux 10/26/2010   Qualifier: Diagnosis of  By: [name] MD, [name]    . Hyperglycemia 06/21/2013  . Hyperlipidemia, mixed  10/16/2015  . Hypertension   . Left hip pain 07/10/2015  . Lesion of breast 12/03/2016   benign; resolved  . Low back pain 05/29/2015  . Measles as a child  . Mumps as a child  . Neck pain on left side 06/21/2013  . Osteopenia 01/03/2017  . Overweight (BMI 25.0-29.9) 10/07/2008   Qualifier: Diagnosis of  By: [name] MD, [name]    . Palpitations   . Prediabetes   . RUQ pain 05/29/2015  . Spinal stenosis   . Umbilical hernia   . Urinary frequency 09/28/2011  . Vertigo    Allergies  Allergen Reactions  . Nutrasweet Aspartame [Aspartame] Diarrhea  . Hydrochlorothiazide Cough  . Lisinopril Cough   Past Surgical History:  Procedure Laterality Date  . BREAST BIOPSY Right 1980   BREAST EXCISIONAL BIOPSY  . BREAST BIOPSY Left 1970   BREAST EXCISIONAL BIOPSY  . BREAST CYST ASPIRATION    . CYSTECTOMY     abdomen  . EYE SURGERY Bilateral    cataract  . INSERTION OF MESH N/A 06/11/2017   Procedure: INSERTION OF MESH;  Surgeon: Rolm Bookbinder, MD;  Location: [place];  Service: General;  Laterality: N/A;  BILATERAL TAP BLOCK  . LAPAROSCOPIC ASSISTED VENTRAL HERNIA REPAIR  06/11/2017   w/mesh  . TUBAL LIGATION  1970  . VENTRAL HERNIA REPAIR N/A 06/11/2017   Procedure: LAPAROSCOPIC VENTRAL HERNIA REPAIR WITH MESH ERAS PATHWAY;  Surgeon: Rolm Bookbinder, MD;  Location: [facility];  Service: General;  Laterality: N/A;  BILATERAL TAP BLOCK   Family History  Problem Relation Age of Onset  . Hypertension Mother   . Anxiety disorder Mother   . Ovarian cancer Mother 27       lung- smoker, ovarian  . Lung cancer Mother        smoker  . Arthritis Mother   . Hyperlipidemia Mother   . Alcohol abuse Father   . Diabetes Sister   . Hypertension Sister   . Ovarian cancer Sister   . Arthritis Sister   . Depression Sister   . Hyperlipidemia Sister   . Other Maternal Grandmother        pacemaker  . Multiple sclerosis Maternal Grandfather        ?  [name] Arthritis Brother   . COPD Brother     . Heart attack Brother   . Hyperlipidemia Brother   . Stroke Brother   . Asthma Sister   . Depression Sister   . Hyperlipidemia Sister   . Asthma Sister   . Depression Sister   . Asthma Paternal Uncle    Social History   Socioeconomic History  . Marital status: Widowed    Spouse name: Not on file  . Number of children: Not on file  . Years of education: Not on file  . Highest education level: Not on file  Occupational History  . Occupation: retired  Scientific laboratory technician  . Financial resource strain: Not on file  . Food insecurity:  Worry: Not on file    Inability: Not on file  . Transportation needs:    Medical: Not on file    Non-medical: Not on file  Tobacco Use  . Smoking status: Former Smoker    Packs/day: 1.00    Years: 30.00    Pack years: 30.00    Types: Cigarettes    Last attempt to quit: 08/27/1990    Years since quitting: 27.5  . Smokeless tobacco: Never Used  Substance and Sexual Activity  . Alcohol use: No  . Drug use: No  . Sexual activity: Never    Comment: lives alone, no dietary restrictions  Lifestyle  . Physical activity:    Days per week: Not on file    Minutes per session: Not on file  . Stress: Not on file  Relationships  . Social connections:    Talks on phone: Not on file    Gets together: Not on file    Attends religious service: Not on file    Active member of club or organization: Not on file    Attends meetings of clubs or organizations: Not on file    Relationship status: Not on file  . Intimate partner violence:    Fear of current or ex partner: Not on file    Emotionally abused: Not on file    Physically abused: Not on file    Forced sexual activity: Not on file  Other Topics Concern  . Not on file  Social History Narrative   Retired. Lives alone.    Attended some business college.    Former smoker.    Smoke alarm in the home. Wears seat balt.    Wears dentures.    Feels safe in her relationships.       Allergies as of  03/06/2018      Reactions   Nutrasweet Aspartame [aspartame] Diarrhea   Hydrochlorothiazide Cough   Lisinopril Cough      Medication List        Accurate as of 03/06/18  1:23 PM. Always use your most recent med list.          fish oil-omega-3 fatty acids 1000 MG capsule Take 1 g by mouth daily.   losartan 50 MG tablet Commonly known as:  COZAAR Take 1 tablet (50 mg total) by mouth daily.   magnesium gluconate 500 MG tablet Commonly known as:  MAGONATE Take 500 mg by mouth daily.   PROBIOTIC-10 PO Take 1 capsule by mouth daily.   triamcinolone cream 0.1 % Commonly known as:  KENALOG Apply topically 2 (two) times daily.       All past medical history, surgical history, allergies, family history, immunizations andmedications were updated in the EMR today and reviewed under the history and medication portions of their EMR.    Recent Results (from the past 2160 hour(s))  HgB A1c     Status: None   Collection Time: 03/04/18  8:13 AM  Result Value Ref Range   Hgb A1c MFr Bld 5.9 4.6 - 6.5 %    Comment: Glycemic Control Guidelines for People with Diabetes:Non Diabetic:  <6%Goal of Therapy: <7%Additional Action Suggested:  >8%   TSH     Status: None   Collection Time: 03/04/18  8:13 AM  Result Value Ref Range   TSH 2.34 0.35 - 4.50 uIU/mL  Lipid panel     Status: Abnormal   Collection Time: 03/04/18  8:13 AM  Result Value Ref Range   Cholesterol 183 0 -  200 mg/dL    Comment: ATP III Classification       Desirable:  < 200 mg/dL               Borderline High:  200 - 239 mg/dL          High:  > = 240 mg/dL   Triglycerides 81.0 0.0 - 149.0 mg/dL    Comment: Normal:  <150 mg/dLBorderline High:  150 - 199 mg/dL   HDL 64.40 >39.00 mg/dL   VLDL 16.2 0.0 - 40.0 mg/dL   LDL Cholesterol 103 (H) 0 - 99 mg/dL   Total CHOL/HDL Ratio 3     Comment:                Men          Women1/2 Average Risk     3.4          3.3Average Risk          5.0          4.42X Average Risk          9.6           7.13X Average Risk          15.0          11.0                       NonHDL 118.78     Comment: NOTE:  Non-HDL goal should be 30 mg/dL higher than patient's LDL goal (i.e. LDL goal of < 70 mg/dL, would have non-HDL goal of < 100 mg/dL)  Comp Met (CMET)     Status: None   Collection Time: 03/04/18  8:13 AM  Result Value Ref Range   Sodium 142 135 - 145 mEq/L   Potassium 4.2 3.5 - 5.1 mEq/L   Chloride 106 96 - 112 mEq/L   CO2 30 19 - 32 mEq/L   Glucose, Bld 97 70 - 99 mg/dL   BUN 16 6 - 23 mg/dL   Creatinine, Ser 0.78 0.40 - 1.20 mg/dL   Total Bilirubin 0.9 0.2 - 1.2 mg/dL   Alkaline Phosphatase 60 39 - 117 U/L   AST 15 0 - 37 U/L   ALT 10 0 - 35 U/L   Total Protein 6.3 6.0 - 8.3 g/dL   Albumin 4.0 3.5 - 5.2 g/dL   Calcium 10.0 8.4 - 10.5 mg/dL   GFR 76.28 >60.00 mL/min  CBC w/Diff     Status: Abnormal   Collection Time: 03/04/18  8:13 AM  Result Value Ref Range   WBC 6.1 4.0 - 10.5 K/uL   RBC 4.47 3.87 - 5.11 Mil/uL   Hemoglobin 14.5 12.0 - 15.0 g/dL   HCT 43.4 36.0 - 46.0 %   MCV 97.0 78.0 - 100.0 fl   MCHC 33.5 30.0 - 36.0 g/dL   RDW 14.1 11.5 - 15.5 %   Platelets 229.0 150.0 - 400.0 K/uL   Neutrophils Relative % 40.9 (L) 43.0 - 77.0 %   Lymphocytes Relative 47.0 (H) 12.0 - 46.0 %   Monocytes Relative 9.2 3.0 - 12.0 %   Eosinophils Relative 2.0 0.0 - 5.0 %   Basophils Relative 0.9 0.0 - 3.0 %   Neutro Abs 2.5 1.4 - 7.7 K/uL   Lymphs Abs 2.9 0.7 - 4.0 K/uL   Monocytes Absolute 0.6 0.1 - 1.0 K/uL   Eosinophils Absolute 0.1 0.0 - 0.7 K/uL   Basophils Absolute 0.1  0.0 - 0.1 K/uL    No results found.   ROS: 14 pt review of systems performed and negative (unless mentioned in an HPI)  Objective: BP 121/75 (BP Location: Right Arm, Patient Position: Sitting, Cuff Size: Normal)   Pulse 65   Temp 98.1 F (36.7 C) (Oral)   Resp 16   Ht 5' 4.5" (1.638 m)   Wt 177 lb (80.3 kg)   SpO2 96%   BMI 29.91 kg/m  Gen: Afebrile. No acute distress.  HENT: AT. [place].  Bilateral TM visualized and normal in appearance. MMM. Bilateral nares without erythema or swelling. Throat without erythema or exudates.  No cough.  No hoarseness. Eyes:Pupils Equal Round Reactive to light, Extraocular movements intact,  Conjunctiva without redness, discharge or icterus. Neck/lymp/endocrine: Supple, no lymphadenopathy, no thyromegaly CV: RRR no murmur, no edema, +2/4 P posterior tibialis pulses Chest: CTAB, no wheeze or crackles Abd: Soft. NTND. BS positive.  No masses palpated.  MSK: No erythema, no obvious deformities, full range of motion. Skin: No rashes, purpura or petechiae.  Neuro:  Normal gait. PERLA. EOMi. Alert. Oriented. Cranial nerves II through XII intact. Muscle strength 5/5 bilateral extremity. DTRs equal bilaterally. Psych: Normal affect, dress and demeanor. Normal speech. Normal thought content and judgment.  Assessment/plan: Christina [name] is a 77 y.o. female present for CPE Elevated hemoglobin A1c - a1c--> 5.9.  - increase exercise. Decrease sugar content in diet. Will monitor q 6 mos.  Breast cancer screening - MM DIAG BREAST TOMO BILATERAL; Future Colon cancer screening - Cologuard Essential hypertension, benign/Overweight (BMI 25.0-29.9)/Mixed hyperlipidemia - stable. Refills provided.  - Med: losartan 50 mg QD.  - labs UTD.  - f/U 6 months.  Vit D/osteopenia: normal 07/29/2017. Continue 800-1000u QD -2017-2.3 ordered for repeat with her mammogram in December - DG Bone Density; Future Encounter for preventive health examination Patient was encouraged to exercise greater than 150 minutes a week. Patient was encouraged to choose a diet filled with fresh fruits and vegetables, and lean meats. AVS provided to patient today for education/recommendation on gender specific health and safety maintenance. Colonoscopy: Cologuard ordered again.  Discussed this will be her last screening, unless abnormal.  She is over 76 now, only completing secondary to  her not having prior colonoscopy greater than 10 years ago and her desire. Mammogram: Placed order today to be completed in December with DEXA screen same day.  For her osteopenia. Immunizations: Patient is up-to-date with the immunizations she desires  Return in about 6 months (around 09/06/2018).   Note is dictated utilizing voice recognition software. Although note has been proof read prior to signing, occasional typographical errors still can be missed. If any questions arise, please do not hesitate to call for verification.  Electronically signed by: Howard Pouch, DO [place]

## 2018-03-10 ENCOUNTER — Telehealth: Payer: Self-pay

## 2018-03-10 NOTE — Telephone Encounter (Signed)
Copied from [place] [id number]. Topic: Quick Communication - Lab Results >> Mar 10, 2018  9:08 AM Christina [name] wrote: Patient is calling to discuss her lab result for 03-06-18. Please advise

## 2018-03-10 NOTE — Telephone Encounter (Signed)
Patient notified of labs  and verbalized understanding. 

## 2018-03-13 LAB — COLOGUARD: Cologuard: NEGATIVE

## 2018-03-26 ENCOUNTER — Encounter: Payer: Self-pay | Admitting: *Deleted

## 2018-03-26 ENCOUNTER — Telehealth: Payer: Self-pay | Admitting: Family Medicine

## 2018-03-26 NOTE — Telephone Encounter (Signed)
Please inform patient the following information: cologuard test is negative. No further testing needed.

## 2018-03-27 NOTE — Telephone Encounter (Signed)
Patient notified and verbalized understanding. 

## 2018-04-02 IMAGING — MG 2D DIGITAL DIAGNOSTIC UNILATERAL LEFT MAMMOGRAM WITH CAD AND ADJ
6 series · 6 of 14 positions shown · non-contrast
Comparison: Previous exam(s).

CLINICAL DATA: Left superior nipple-areolar complex redness and
pain. Patient was treated for mastitis with 2 week course of
antibiotics, starting approximately 3 weeks ago. She had a pustule
on her left nipple at 12 o'clock, which she punctured herself. Per
patient's report, thick white material drained from the puncture
site, and that relieved her pain. The redness in the areolar left
breast however persisted.

EXAM:
2D DIGITAL DIAGNOSTIC LEFT MAMMOGRAM WITH CAD AND ADJUNCT TOMO
ULTRASOUND LEFT BREAST

[L MLO synth-2D]
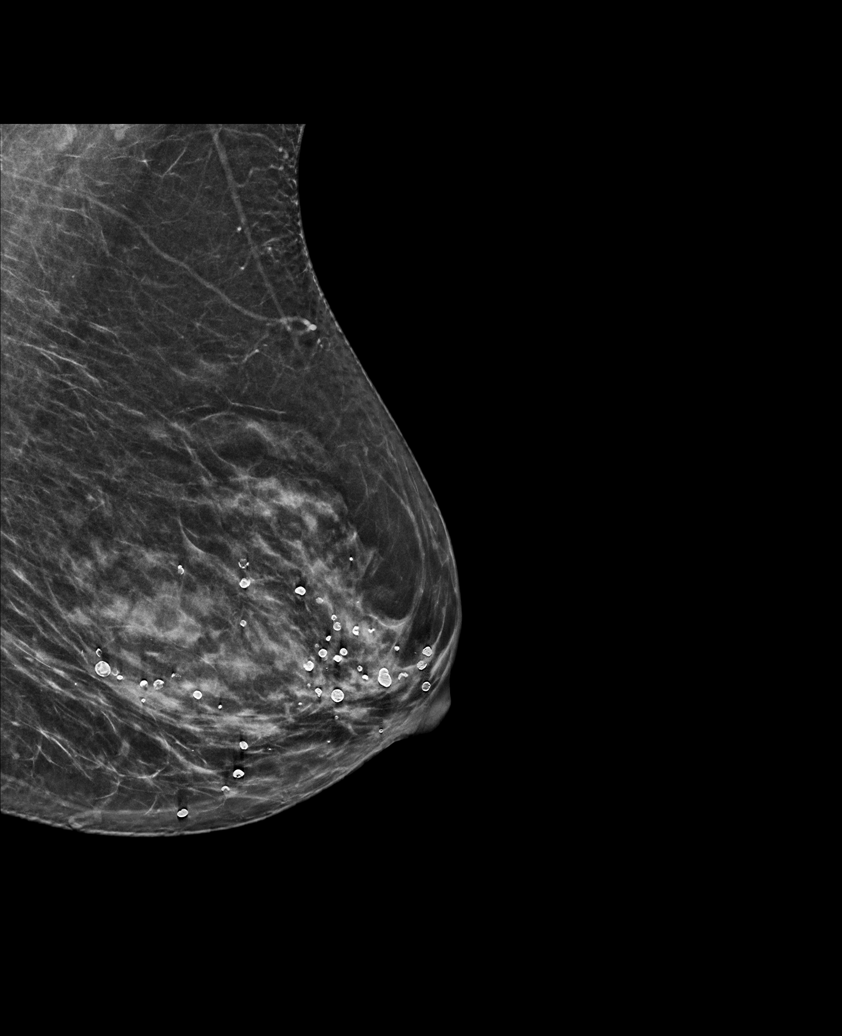

[L CC]
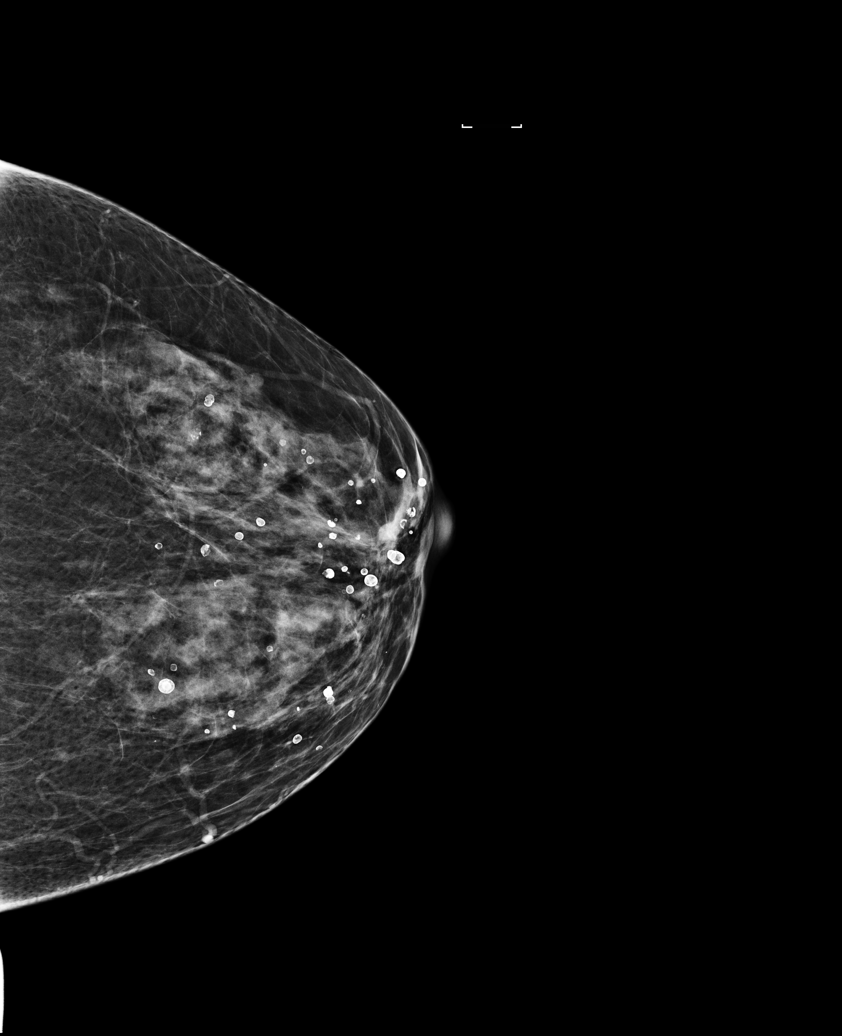

[L MLO]
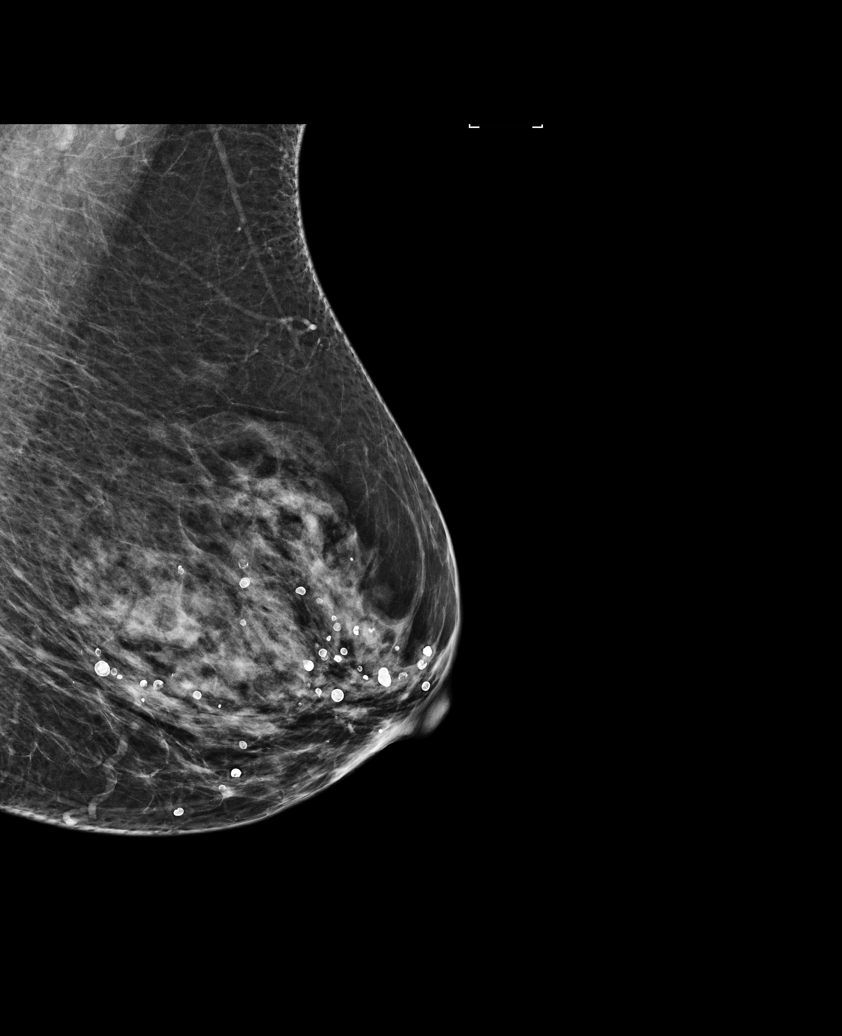

[L CC synth-2D]
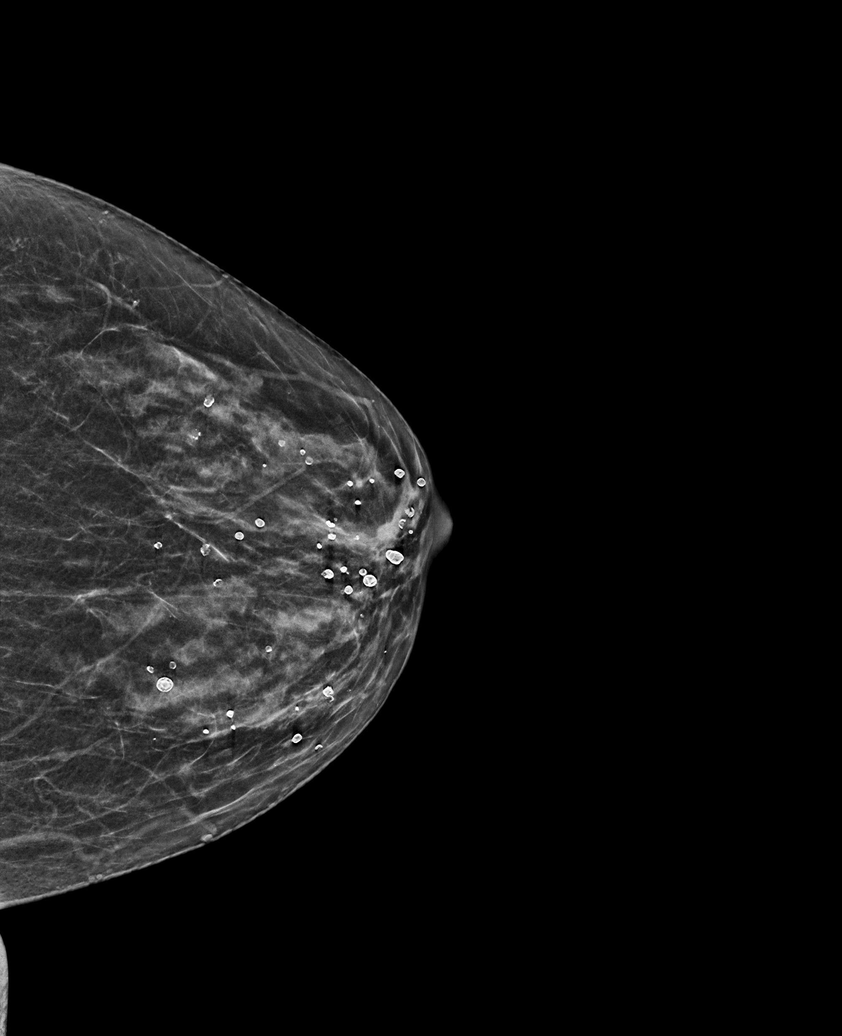

[L CC tomo · tomo slice 27/53.0]
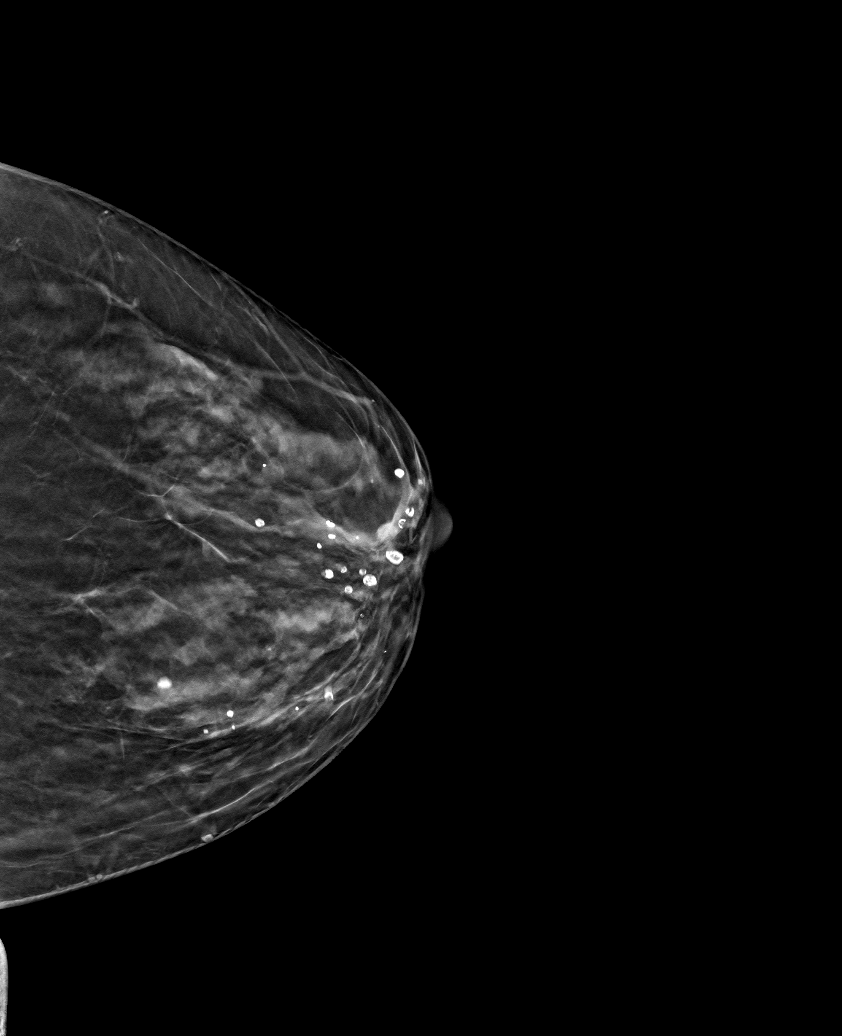

[L MLO tomo · tomo slice 29/58.0]
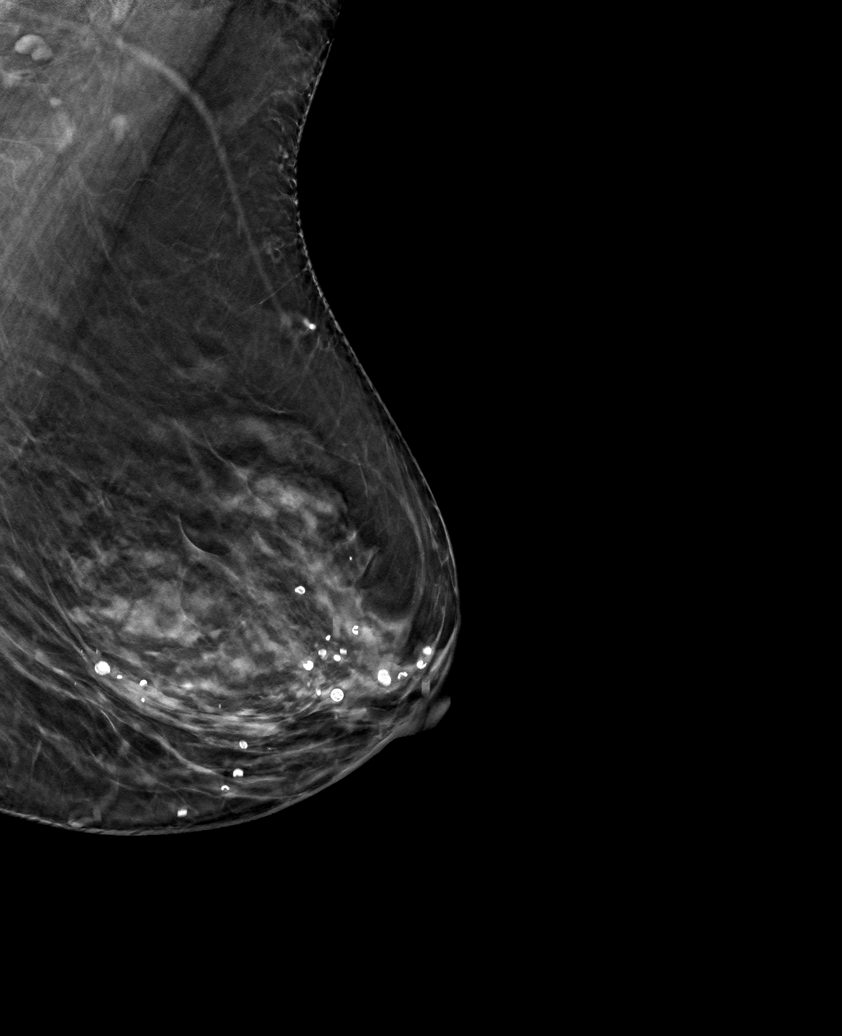

[6 of 14 positions shown; findings below may reference images not displayed]

ACR Breast Density Category c: The breast tissue is heterogeneously
dense, which may obscure small masses.
FINDINGS: Mammographically, there are no suspicious masses, areas of
architectural distortion or microcalcifications. Numerous calcified
oil cysts are seen throughout the left breast.

Mammographic images were processed with CAD.

On physical exam, there is diffuse redness of the superior left
nipple-areolar complex. The superior portion of the left nipple is
slightly edematous and demonstrate pinkish discoloration. No nipple
discharge is demonstrated.

Targeted ultrasound is performed, showing no suspicious masses or
shadowing lesions. Minimal, likely trivial, duct ectasia is seen in
the subareolar left breast.
IMPRESSION: No mammographic or sonographic evidence of malignancy in the left
breast.

RECOMMENDATION:
Clinical follow-up to further investigate the skin changes of the
left nipple-areolar complex.

The patient has an appointment with Dr. Ackermann on the [REDACTED].

I have discussed the findings and recommendations with the patient.
Results were also provided in writing at the conclusion of the
visit. If applicable, a reminder letter will be sent to the patient
regarding the next appointment.

BI-RADS CATEGORY  2: Benign.

## 2018-05-15 ENCOUNTER — Encounter: Payer: Self-pay | Admitting: Family Medicine

## 2018-05-27 ENCOUNTER — Encounter: Payer: Self-pay | Admitting: Family Medicine

## 2018-05-28 ENCOUNTER — Encounter: Payer: Self-pay | Admitting: Family Medicine

## 2018-06-05 ENCOUNTER — Ambulatory Visit: Payer: Medicare Other | Admitting: Family Medicine

## 2018-06-05 ENCOUNTER — Encounter: Payer: Self-pay | Admitting: Family Medicine

## 2018-06-05 VITALS — BP 139/78 | HR 64 | Temp 98.2°F | Resp 20 | Ht 65.0 in | Wt 182.0 lb

## 2018-06-05 DIAGNOSIS — D729 Disorder of white blood cells, unspecified: Secondary | ICD-10-CM

## 2018-06-05 DIAGNOSIS — R05 Cough: Secondary | ICD-10-CM | POA: Diagnosis not present

## 2018-06-05 DIAGNOSIS — R059 Cough, unspecified: Secondary | ICD-10-CM

## 2018-06-05 LAB — CBC WITH DIFFERENTIAL/PLATELET
Basophils Absolute: 0 10*3/uL (ref 0.0–0.1)
Basophils Relative: 0.4 % (ref 0.0–3.0)
Eosinophils Absolute: 0.1 10*3/uL (ref 0.0–0.7)
Eosinophils Relative: 1 % (ref 0.0–5.0)
HCT: 43.8 % (ref 36.0–46.0)
Hemoglobin: 14.3 g/dL (ref 12.0–15.0)
Lymphocytes Relative: 38.8 % (ref 12.0–46.0)
Lymphs Abs: 2.8 10*3/uL (ref 0.7–4.0)
MCHC: 32.6 g/dL (ref 30.0–36.0)
MCV: 97.1 fl (ref 78.0–100.0)
Monocytes Absolute: 0.6 10*3/uL (ref 0.1–1.0)
Monocytes Relative: 8.1 % (ref 3.0–12.0)
Neutro Abs: 3.7 10*3/uL (ref 1.4–7.7)
Neutrophils Relative %: 51.7 % (ref 43.0–77.0)
Platelets: 243 10*3/uL (ref 150.0–400.0)
RBC: 4.52 Mil/uL (ref 3.87–5.11)
RDW: 14.1 % (ref 11.5–15.5)
WBC: 7.2 10*3/uL (ref 4.0–10.5)

## 2018-06-05 NOTE — Progress Notes (Signed)
Christina [name] , [birth year], 77 y.o., female MRN: [id number] Patient Care Team    Relationship Specialty Notifications Start End  Ma Hillock, DO PCP - General Family Medicine  09/02/17   Rolm Bookbinder, MD Consulting Physician General Surgery  10/05/16   Monna Fam, MD Consulting Physician Ophthalmology  10/05/16   Garry Heater, DDS Consulting Physician Dentistry  10/05/16   Juanita Craver, MD Consulting Physician Gastroenterology  09/02/17     Chief Complaint  Patient presents with  . Follow-up     Subjective: Pt presents for an OV to follow-up on abnormal CBC.    Abnormal CBC/cough: Patient was seen 03/06/2018 with mildly decrease in neutrophils percentile increase in lymphocytes with a complaint of mild fatigue and fear of cancers.  She had also been complaining of hot flashes/night sweats.  She is present today to follow-up on her labs.  She also endorses new cough for the past 2 weeks that is dry in nature.  She states it is just annoying.  She is on her losartan.  Did have a cough with lisinopril.  She does not have seasonal allergies to her knowledge.   Depression screen [facility] 2/9 06/05/2018 03/06/2018 12/17/2017 09/02/2017 03/04/2017  Decreased Interest 0 0 0 0 1  Down, Depressed, Hopeless 0 0 0 0 3  PHQ - 2 Score 0 0 0 0 4  Altered sleeping - - - - 2  Tired, decreased energy - - - - 2  Change in appetite - - - - 2  Feeling bad or failure about yourself  - - - - 0  Trouble concentrating - - - - 1  Moving slowly or fidgety/restless - - - - 0  Suicidal thoughts - - - - 0  PHQ-9 Score - - - - 11    Allergies  Allergen Reactions  . Nutrasweet Aspartame [Aspartame] Diarrhea  . Hydrochlorothiazide Cough  . Lisinopril Cough   Social History   Tobacco Use  . Smoking status: Former Smoker    Packs/day: 1.00    Years: 30.00    Pack years: 30.00    Types: Cigarettes    Last attempt to quit: 08/27/1990    Years since quitting: 27.7  . Smokeless tobacco: Never Used    Substance Use Topics  . Alcohol use: No   Past Medical History:  Diagnosis Date  . Arthritis   . Asthma    a little?  . Cervical cancer screening 01/31/2012  . Chicken pox as a child  . Depression with anxiety 06/07/2009   Qualifier: Diagnosis of  By: [name] MD, [name]    . Dermatitis of external ear 07/11/2012  . Dysphagia 01/31/2012  . Hiatal hernia with gastroesophageal reflux 10/26/2010   Qualifier: Diagnosis of  By: [name] MD, [name]    . Hyperglycemia 06/21/2013  . Hyperlipidemia, mixed 10/16/2015  . Hypertension   . Left hip pain 07/10/2015  . Lesion of breast 12/03/2016   benign; resolved  . Low back pain 05/29/2015  . Measles as a child  . Mumps as a child  . Neck pain on left side 06/21/2013  . Osteopenia 01/03/2017  . Overweight (BMI 25.0-29.9) 10/07/2008   Qualifier: Diagnosis of  By: [name] MD, [name]    . Palpitations   . Prediabetes   . RUQ pain 05/29/2015  . Spinal stenosis   . Umbilical hernia   . Urinary frequency 09/28/2011  . Vertigo    Past Surgical History:  Procedure Laterality Date  . BREAST  BIOPSY Right 1980   BREAST EXCISIONAL BIOPSY  . BREAST BIOPSY Left 1970   BREAST EXCISIONAL BIOPSY  . BREAST CYST ASPIRATION    . CYSTECTOMY     abdomen  . EYE SURGERY Bilateral    cataract  . INSERTION OF MESH N/A 06/11/2017   Procedure: INSERTION OF MESH;  Surgeon: Rolm Bookbinder, MD;  Location: [place];  Service: General;  Laterality: N/A;  BILATERAL TAP BLOCK  . LAPAROSCOPIC ASSISTED VENTRAL HERNIA REPAIR  06/11/2017   w/mesh  . TUBAL LIGATION  1970  . VENTRAL HERNIA REPAIR N/A 06/11/2017   Procedure: LAPAROSCOPIC VENTRAL HERNIA REPAIR WITH MESH ERAS PATHWAY;  Surgeon: Rolm Bookbinder, MD;  Location: [place];  Service: General;  Laterality: N/A;  BILATERAL TAP BLOCK   Family History  Problem Relation Age of Onset  . Hypertension Mother   . Anxiety disorder Mother   . Ovarian cancer Mother 10       lung- smoker, ovarian  . Lung cancer  Mother        smoker  . Arthritis Mother   . Hyperlipidemia Mother   . Alcohol abuse Father   . Diabetes Sister   . Hypertension Sister   . Ovarian cancer Sister   . Arthritis Sister   . Depression Sister   . Hyperlipidemia Sister   . Other Maternal Grandmother        pacemaker  . Multiple sclerosis Maternal Grandfather        ?  [name] Arthritis Brother   . COPD Brother   . Heart attack Brother   . Hyperlipidemia Brother   . Stroke Brother   . Asthma Sister   . Depression Sister   . Hyperlipidemia Sister   . Asthma Sister   . Depression Sister   . Asthma Paternal Uncle    Allergies as of 06/05/2018      Reactions   Nutrasweet Aspartame [aspartame] Diarrhea   Hydrochlorothiazide Cough   Lisinopril Cough      Medication List        Accurate as of 06/05/18  1:17 PM. Always use your most recent med list.          fish oil-omega-3 fatty acids 1000 MG capsule Take 1 g by mouth daily.   losartan 50 MG tablet Commonly known as:  COZAAR Take 1 tablet (50 mg total) by mouth daily.   magnesium gluconate 500 MG tablet Commonly known as:  MAGONATE Take 500 mg by mouth daily.   PROBIOTIC-10 PO Take 1 capsule by mouth daily.   triamcinolone cream 0.1 % Commonly known as:  KENALOG Apply topically 2 (two) times daily.       All past medical history, surgical history, allergies, family history, immunizations andmedications were updated in the EMR today and reviewed under the history and medication portions of their EMR.     ROS: Negative, with the exception of above mentioned in HPI   Objective:  BP 139/78 (BP Location: Right Arm, Patient Position: Sitting, Cuff Size: Normal)   Pulse 64   Temp 98.2 F (36.8 C)   Resp 20   Ht 5\' 5"  (1.651 m)   Wt 182 lb (82.6 kg)   SpO2 98%   BMI 30.29 kg/m  Body mass index is 30.29 kg/m. Gen: Afebrile. No acute distress. Nontoxic in appearance, well developed, well nourished.  HENT: AT. [place]. Bilateral TM visualized no  erythema or fullness. MMM, no oral lesions. Bilateral nares erythema, mild drainage, mild swelling.. Throat without erythema or  exudates.  Nasal drip present.  Mild cough present. Eyes:Pupils Equal Round Reactive to light, Extraocular movements intact,  Conjunctiva without redness, discharge or icterus. Neck/lymp/endocrine: Supple, no lymphadenopathy CV: RRR no murmur, no edema Chest: CTAB, no wheeze or crackles. Good air movement, normal resp effort.  Skin: no rashes, purpura or petechiae.  Neuro:  Normal gait. PERLA. EOMi. Alert. Oriented x3 Psych: Normal affect, dress and demeanor. Normal speech. Normal thought content and judgment.  No exam data present No results found. No results found for this or any previous visit (from the past 24 hour(s)).  Assessment/Plan: Christina [name] is a 77 y.o. female present for OV for  Abnormal WBC count - discussed results with her in person today. Suspect abnormalities were from acute illness at the time. Will recheck today to ensure stable.  - CBC w/Diff Cough Possibly from allergies. She has some mild allergy signs on exam. Advised her to take allegra or OTC antihistamine of choice for a few weeks and Flonase, mucinex - robitussin combo can be helpful. If not resolved in 2-4 weeks, then follow up and we can try a different amlodipine in place of losartan.    Reviewed expectations re: course of current medical issues.  Discussed self-management of symptoms.  Outlined signs and symptoms indicating need for more acute intervention.  Patient verbalized understanding and all questions were answered.  Patient received an After-Visit Summary.    Orders Placed This Encounter  Procedures  . CBC w/Diff     Note is dictated utilizing voice recognition software. Although note has been proof read prior to signing, occasional typographical errors still can be missed. If any questions arise, please do not hesitate to call for verification.    electronically signed by:  Howard Pouch, DO  [place]

## 2018-06-05 NOTE — Patient Instructions (Signed)
Try allegra daily for allergies for next few weeks and flonase nasal spray can be helpful as well. Both are over the counter.   Mucinex DM is good for cough   We will recheck labs today and call you with results as soon as we get them.   It was nice to see you today.

## 2018-06-06 ENCOUNTER — Ambulatory Visit: Payer: Medicare Other | Admitting: Family Medicine

## 2018-07-01 ENCOUNTER — Other Ambulatory Visit: Payer: Self-pay | Admitting: Family Medicine

## 2018-07-01 DIAGNOSIS — Z1231 Encounter for screening mammogram for malignant neoplasm of breast: Secondary | ICD-10-CM

## 2018-09-01 ENCOUNTER — Ambulatory Visit: Payer: Medicare Other | Admitting: Family Medicine

## 2018-09-01 ENCOUNTER — Other Ambulatory Visit: Payer: Self-pay

## 2018-09-01 ENCOUNTER — Encounter: Payer: Self-pay | Admitting: Family Medicine

## 2018-09-01 VITALS — BP 125/65 | HR 77 | Temp 98.1°F | Resp 14 | Ht 65.0 in | Wt 182.0 lb

## 2018-09-01 DIAGNOSIS — R944 Abnormal results of kidney function studies: Secondary | ICD-10-CM | POA: Diagnosis not present

## 2018-09-01 DIAGNOSIS — N3281 Overactive bladder: Secondary | ICD-10-CM

## 2018-09-01 LAB — BASIC METABOLIC PANEL
BUN: 19 mg/dL (ref 6–23)
CO2: 27 mEq/L (ref 19–32)
Calcium: 10.4 mg/dL (ref 8.4–10.5)
Chloride: 105 mEq/L (ref 96–112)
Creatinine, Ser: 0.75 mg/dL (ref 0.40–1.20)
GFR: 79.71 mL/min (ref >60.00)
Glucose, Bld: 87 mg/dL (ref 70–99)
Potassium: 4 mEq/L (ref 3.5–5.1)
Sodium: 138 mEq/L (ref 135–145)

## 2018-09-01 NOTE — Patient Instructions (Signed)
We will retest your kidney function today to ensure accuracy.   Controlling hypertension will preserve your kidney function.    Overactive Bladder, Adult  Overactive bladder refers to a condition in which a person has a sudden need to pass urine. The person may leak urine if he or she cannot get to the bathroom fast enough (urinary incontinence). A person with this condition may also wake up several times in the night to go to the bathroom. Overactive bladder is associated with poor nerve signals between your bladder and your brain. Your bladder may get the signal to empty before it is full. You may also have very sensitive muscles that make your bladder squeeze too soon. These symptoms might interfere with daily work or social activities. What are the causes? This condition may be associated with or caused by:  Urinary tract infection.  Infection of nearby tissues, such as the prostate.  Prostate enlargement.  Surgery on the uterus or urethra.  Bladder stones, inflammation, or tumors.  Drinking too much caffeine or alcohol.  Certain medicines, especially medicines that get rid of extra fluid in the body (diuretics).  Muscle or nerve weakness, especially from: ? A spinal cord injury. ? Stroke. ? Multiple sclerosis. ? Parkinson's disease.  Diabetes.  Constipation. What increases the risk? You may be at greater risk for overactive bladder if you:  Are an older adult.  Smoke.  Are going through menopause.  Have prostate problems.  Have a neurological disease, such as stroke, dementia, Parkinson's disease, or multiple sclerosis (MS).  Eat or drink things that irritate the bladder. These include alcohol, spicy food, and caffeine.  Are overweight or obese. What are the signs or symptoms? Symptoms of this condition include:  Sudden, strong urge to urinate.  Leaking urine.  Urinating 8 or more times a day.  Waking up to urinate 2 or more times a night. How is this  diagnosed? Your health care provider may suspect overactive bladder based on your symptoms. He or she will diagnose this condition by:  A physical exam and medical history.  Blood or urine tests. You might need bladder or urine tests to help determine what is causing your overactive bladder. You might also need to see a health care provider who specializes in urinary tract problems (urologist). How is this treated? Treatment for overactive bladder depends on the cause of your condition and whether it is mild or severe. You can also make lifestyle changes at home. Options include:  Bladder training. This may include: ? Learning to control the urge to urinate by following a schedule that directs you to urinate at regular intervals (timed voiding). ? Doing Kegel exercises to strengthen your pelvic floor muscles, which support your bladder. Toning these muscles can help you control urination, even if your bladder muscles are overactive.  Special devices. This may include: ? Biofeedback, which uses sensors to help you become aware of your body's signals. ? Electrical stimulation, which uses electrodes placed inside the body (implanted) or outside the body. These electrodes send gentle pulses of electricity to strengthen the nerves or muscles that control the bladder. ? Women may use a plastic device that fits into the vagina and supports the bladder (pessary).  Medicines. ? Antibiotics to treat bladder infection. ? Antispasmodics to stop the bladder from releasing urine at the wrong time. ? Tricyclic antidepressants to relax bladder muscles. ? Injections of botulinum toxin type A directly into the bladder tissue to relax bladder muscles.  Lifestyle changes. This  may include: ? Weight loss. Talk to your health care provider about weight loss methods that would work best for you. ? Diet changes. This may include reducing how much alcohol and caffeine you consume, or drinking fluids at different  times of the day. ? Not smoking. Do not use any products that contain nicotine or tobacco, such as cigarettes and e-cigarettes. If you need help quitting, ask your health care provider.  Surgery. ? A device may be implanted to help manage the nerve signals that control urination. ? An electrode may be implanted to stimulate electrical signals in the bladder. ? A procedure may be done to change the shape of the bladder. This is done only in very severe cases. Follow these instructions at home: Lifestyle  Make any diet or lifestyle changes that are recommended by your health care provider. These may include: ? Drinking less fluid or drinking fluids at different times of the day. ? Cutting down on caffeine or alcohol. ? Doing Kegel exercises. ? Losing weight if needed. ? Eating a healthy and balanced diet to prevent constipation. This may include:  Eating foods that are high in fiber, such as fresh fruits and vegetables, whole grains, and beans.  Limiting foods that are high in fat and processed sugars, such as fried and sweet foods. General instructions  Take over-the-counter and prescription medicines only as told by your health care provider.  If you were prescribed an antibiotic medicine, take it as told by your health care provider. Do not stop taking the antibiotic even if you start to feel better.  Use any implants or pessary as told by your health care provider.  If needed, wear pads to absorb urine leakage.  Keep a journal or log to track how much and when you drink and when you feel the need to urinate. This will help your health care provider monitor your condition.  Keep all follow-up visits as told by your health care provider. This is important. Contact a health care provider if:  You have a fever.  Your symptoms do not get better with treatment.  Your pain and discomfort get worse.  You have more frequent urges to urinate. Get help right away if:  You are not  able to control your bladder. Summary  Overactive bladder refers to a condition in which a person has a sudden need to pass urine.  Several conditions may lead to an overactive bladder.  Treatment for overactive bladder depends on the cause and severity of your condition.  Follow your health care provider's instructions about lifestyle changes, doing Kegel exercises, keeping a journal, and taking medicines. This information is not intended to replace advice given to you by your health care provider. Make sure you discuss any questions you have with your health care provider. Document Released: 06/09/2009 Document Revised: 08/29/2017 Document Reviewed: 08/29/2017 Elsevier Interactive Patient Education  2019 [employer].

## 2018-09-01 NOTE — Progress Notes (Signed)
Christina [name] , 29-Jun-1941, 78 y.o., female MRN: [id number] Patient Care Team    Relationship Specialty Notifications Start End  Ma Hillock, DO PCP - General Family Medicine  09/02/17   Rolm Bookbinder, MD Consulting Physician General Surgery  10/05/16   Monna Fam, MD Consulting Physician Ophthalmology  10/05/16   Garry Heater, DDS Consulting Physician Dentistry  10/05/16   Juanita Craver, MD Consulting Physician Gastroenterology  09/02/17     Chief Complaint  Patient presents with  . Abnormal Lab    Pt concerns of kidney's. Lifeline test showed GFR was 55     Subjective: Pt presents for an OV with concerns of her kidney function. She had a "lifeline" test that showed a Cr 1.00 and a GFR of 55 and reported as abnormal. She has a h/o well controlled hypertension. She also has chronic urinary frequency. She reports urinating about every 2-3 hours, no dysuria, fever or chills. She has noticed it is worse when she drinks her coffee. She drinks water throughout the day.   Depression screen [facility] 2/9 06/05/2018 03/06/2018 12/17/2017 09/02/2017 03/04/2017  Decreased Interest 0 0 0 0 1  Down, Depressed, Hopeless 0 0 0 0 3  PHQ - 2 Score 0 0 0 0 4  Altered sleeping - - - - 2  Tired, decreased energy - - - - 2  Change in appetite - - - - 2  Feeling bad or failure about yourself  - - - - 0  Trouble concentrating - - - - 1  Moving slowly or fidgety/restless - - - - 0  Suicidal thoughts - - - - 0  PHQ-9 Score - - - - 11    Allergies  Allergen Reactions  . Nutrasweet Aspartame [Aspartame] Diarrhea  . Hydrochlorothiazide Cough  . Lisinopril Cough   Social History   Tobacco Use  . Smoking status: Former Smoker    Packs/day: 1.00    Years: 30.00    Pack years: 30.00    Types: Cigarettes    Last attempt to quit: 08/27/1990    Years since quitting: 28.0  . Smokeless tobacco: Never Used  Substance Use Topics  . Alcohol use: No   Past Medical History:  Diagnosis Date  . Arthritis    . Asthma    a little?  . Cervical cancer screening 01/31/2012  . Chicken pox as a child  . Depression with anxiety 06/07/2009   Qualifier: Diagnosis of  By: [name] MD, [name]    . Dermatitis of external ear 07/11/2012  . Dysphagia 01/31/2012  . Hiatal hernia with gastroesophageal reflux 10/26/2010   Qualifier: Diagnosis of  By: [name] MD, [name]    . Hyperglycemia 06/21/2013  . Hyperlipidemia, mixed 10/16/2015  . Hypertension   . Left hip pain 07/10/2015  . Lesion of breast 12/03/2016   benign; resolved  . Low back pain 05/29/2015  . Measles as a child  . Mumps as a child  . Neck pain on left side 06/21/2013  . Osteopenia 01/03/2017  . Overweight (BMI 25.0-29.9) 10/07/2008   Qualifier: Diagnosis of  By: [name] MD, [name]    . Palpitations   . Prediabetes   . RUQ pain 05/29/2015  . Spinal stenosis   . Umbilical hernia   . Urinary frequency 09/28/2011  . Vertigo    Past Surgical History:  Procedure Laterality Date  . BREAST BIOPSY Right 1980   BREAST EXCISIONAL BIOPSY  . BREAST BIOPSY Left 1970   BREAST EXCISIONAL BIOPSY  .  BREAST CYST ASPIRATION    . CYSTECTOMY     abdomen  . EYE SURGERY Bilateral    cataract  . INSERTION OF MESH N/A 06/11/2017   Procedure: INSERTION OF MESH;  Surgeon: Rolm Bookbinder, MD;  Location: [place];  Service: General;  Laterality: N/A;  BILATERAL TAP BLOCK  . LAPAROSCOPIC ASSISTED VENTRAL HERNIA REPAIR  06/11/2017   w/mesh  . TUBAL LIGATION  1970  . VENTRAL HERNIA REPAIR N/A 06/11/2017   Procedure: LAPAROSCOPIC VENTRAL HERNIA REPAIR WITH MESH ERAS PATHWAY;  Surgeon: Rolm Bookbinder, MD;  Location: [place];  Service: General;  Laterality: N/A;  BILATERAL TAP BLOCK   Family History  Problem Relation Age of Onset  . Hypertension Mother   . Anxiety disorder Mother   . Ovarian cancer Mother 64       lung- smoker, ovarian  . Lung cancer Mother        smoker  . Arthritis Mother   . Hyperlipidemia Mother   . Alcohol abuse Father     . Diabetes Sister   . Hypertension Sister   . Ovarian cancer Sister   . Arthritis Sister   . Depression Sister   . Hyperlipidemia Sister   . Other Maternal Grandmother        pacemaker  . Multiple sclerosis Maternal Grandfather        ?  [name] Arthritis Brother   . COPD Brother   . Heart attack Brother   . Hyperlipidemia Brother   . Stroke Brother   . Asthma Sister   . Depression Sister   . Hyperlipidemia Sister   . Asthma Sister   . Depression Sister   . Asthma Paternal Uncle    Allergies as of 09/01/2018      Reactions   Nutrasweet Aspartame [aspartame] Diarrhea   Hydrochlorothiazide Cough   Lisinopril Cough      Medication List       Accurate as of September 01, 2018  9:16 AM. Always use your most recent med list.        fish oil-omega-3 fatty acids 1000 MG capsule Take 1 g by mouth daily.   losartan 50 MG tablet Commonly known as:  COZAAR Take 1 tablet (50 mg total) by mouth daily.   magnesium gluconate 500 MG tablet Commonly known as:  MAGONATE Take 500 mg by mouth daily.   PROBIOTIC-10 PO Take 1 capsule by mouth daily.   triamcinolone cream 0.1 % Commonly known as:  KENALOG Apply topically 2 (two) times daily.       All past medical history, surgical history, allergies, family history, immunizations andmedications were updated in the EMR today and reviewed under the history and medication portions of their EMR.     ROS: Negative, with the exception of above mentioned in HPI   Objective:  BP 125/65   Pulse 77   Temp 98.1 F (36.7 C) (Oral)   Resp 14   Ht 5\' 5"  (1.651 m)   Wt 182 lb (82.6 kg)   SpO2 96%   BMI 30.29 kg/m  Body mass index is 30.29 kg/m. Gen: Afebrile. No acute distress. Nontoxic in appearance, well developed, well nourished.  HENT: AT. [place].  MMM Eyes:Pupils Equal Round Reactive to light, Extraocular movements intact,  Conjunctiva without redness, discharge or icterus. CV: RRR no murmur, no edema Chest: CTAB, no wheeze or  crackles. Good air movement, normal resp effort.  Neuro: Normal gait. PERLA. EOMi. Alert. Oriented x3  Psych: Normal affect, dress and demeanor.  Normal speech. Normal thought content and judgment.  No exam data present No results found. No results found for this or any previous visit (from the past 24 hour(s)).  Assessment/Plan: Christina [name] is a 78 y.o. female present for OV for  Decreased GFR - will repeat lab for accuracy for her. Discussed many variables can play a role in GFR and even if it is accurate- GFR 55 is ok.  - tight control of BP and avoid DM, and overuse of NSAIDS discussed to help maintain kidney health.  - Basic Metabolic Panel (BMET) - PRN  Overactive bladder Discussed bladder training and consider gyn/uro referral for eval and urinary studies. May benefit from pessary etc. She will think about it. She reports she can live with it fine, as long as her kidneys are ok.  Will be happy to place referral anytime if she changes her mind.    Reviewed expectations re: course of current medical issues.  Discussed self-management of symptoms.  Outlined signs and symptoms indicating need for more acute intervention.  Patient verbalized understanding and all questions were answered.  Patient received an After-Visit Summary.    Orders Placed This Encounter  Procedures  . Basic Metabolic Panel (BMET)     Note is dictated utilizing voice recognition software. Although note has been proof read prior to signing, occasional typographical errors still can be missed. If any questions arise, please do not hesitate to call for verification.   electronically signed by:  Howard Pouch, DO  [place]

## 2018-09-05 ENCOUNTER — Ambulatory Visit
Admission: RE | Admit: 2018-09-05 | Discharge: 2018-09-05 | Disposition: A | Discharge status: Home / Self Care | Payer: Medicare Other | Source: Ambulatory Visit | Attending: Family Medicine | Admitting: Family Medicine

## 2018-09-05 ENCOUNTER — Telehealth: Payer: Self-pay | Admitting: Family Medicine

## 2018-09-05 DIAGNOSIS — M8580 Other specified disorders of bone density and structure, unspecified site: Secondary | ICD-10-CM

## 2018-09-05 DIAGNOSIS — Z1231 Encounter for screening mammogram for malignant neoplasm of breast: Secondary | ICD-10-CM

## 2018-09-05 DIAGNOSIS — E559 Vitamin D deficiency, unspecified: Secondary | ICD-10-CM

## 2018-09-05 NOTE — Telephone Encounter (Signed)
Please inform patient the following information: Her bone density results are still osteopenia- or lower than normal bone density. It is comparable to her prior test a few years ago. By the results she is at increased risk for hip fracture.  - recommendations: she meets criteria to start a medication called fosamax (or like medicine) to help prevent further loss of density. This is a medication that is taken once a week on an empty stomach and a full glass of water.  If she would like to start this medicine I will call it in for her. Please advise.  If she would like to discuss in more detail please encourage her to make an appt and we can do so. At home recs:  Osteopenia: 1.) Drink alcohol in moderation only. 2.) Decrease caffeine consumption to no  More than 2.5 cups of coffee a day. 3.) Exercise: weight bearing (walking counts), strength and balance training. 4.) No smoking.  5.) Sunlight/Ultraviolet light exposure 30 minutes a day/5 days a week. 6.) Vitamin D supplement 800 iu a day (at least, more if told otherwise) and calcium 1200 mg a day (as long as not told to avoid in the past)

## 2018-09-05 NOTE — Telephone Encounter (Signed)
LM for pt to call back. Okay for PEC to discuss lab results when pt calls back.

## 2018-09-08 ENCOUNTER — Telehealth: Payer: Self-pay | Admitting: Family Medicine

## 2018-09-08 NOTE — Telephone Encounter (Signed)
Please inform patient the following information: mammogram is normal.  

## 2018-09-08 NOTE — Telephone Encounter (Signed)
Pt. Given bone density results and provider's recommendations. Reports she is hesitant to take medication at this time. Will think about it and call us back.

## 2018-09-09 NOTE — Telephone Encounter (Signed)
Pt notified of mammogram. Pt voiced understanding.

## 2018-09-19 ENCOUNTER — Ambulatory Visit: Payer: Medicare Other | Admitting: Family Medicine

## 2018-09-19 ENCOUNTER — Encounter: Payer: Self-pay | Admitting: Family Medicine

## 2018-09-19 VITALS — BP 133/72 | HR 80 | Temp 98.0°F | Resp 16 | Ht 65.0 in | Wt 182.4 lb

## 2018-09-19 DIAGNOSIS — J209 Acute bronchitis, unspecified: Secondary | ICD-10-CM | POA: Diagnosis not present

## 2018-09-19 DIAGNOSIS — J069 Acute upper respiratory infection, unspecified: Secondary | ICD-10-CM | POA: Diagnosis not present

## 2018-09-19 DIAGNOSIS — R062 Wheezing: Secondary | ICD-10-CM

## 2018-09-19 MED ORDER — HYDROCODONE-HOMATROPINE 5-1.5 MG/5ML PO SYRP: ORAL_SOLUTION | ORAL | 0 refills | Status: DC

## 2018-09-19 NOTE — Patient Instructions (Signed)
Get otc generic robitussin DM OR Mucinex DM and use as directed on the packaging for cough and congestion. Use otc generic saline nasal spray 2-3 times per day to irrigate/moisturize your nasal passages.   

## 2018-09-19 NOTE — Progress Notes (Signed)
OFFICE VISIT  09/19/2018   CC:  Chief Complaint  Patient presents with  . Cough   HPI:    Patient is a 78 y.o. Caucasian female who presents for cough.  Deep in chest, chest burns when she coughs, "sounds awful". Onset 5-6 days ago, worsened yesterday.  No fever.  Describes chest rattling sounds but not wheezing. No SOB.  Taking flu defense and honey--helps. Onset of ST and ear pain today.  No nasal congestion/runny nose. Ex smoke, but quit 25 yrs ago.    Past Medical History:  Diagnosis Date  . Arthritis   . Asthma    a little?  . Cervical cancer screening 01/31/2012  . Chicken pox as a child  . Depression with anxiety 06/07/2009   Qualifier: Diagnosis of  By: [name] MD, [name]    . Dermatitis of external ear 07/11/2012  . Dysphagia 01/31/2012  . Hiatal hernia with gastroesophageal reflux 10/26/2010   Qualifier: Diagnosis of  By: [name] MD, [name]    . Hyperglycemia 06/21/2013  . Hyperlipidemia, mixed 10/16/2015  . Hypertension   . Left hip pain 07/10/2015  . Lesion of breast 12/03/2016   benign; resolved  . Low back pain 05/29/2015  . Measles as a child  . Mumps as a child  . Neck pain on left side 06/21/2013  . Osteopenia 01/03/2017  . Overweight (BMI 25.0-29.9) 10/07/2008   Qualifier: Diagnosis of  By: [name] MD, [name]    . Palpitations   . Prediabetes   . RUQ pain 05/29/2015  . Spinal stenosis   . Umbilical hernia   . Urinary frequency 09/28/2011  . Vertigo     Past Surgical History:  Procedure Laterality Date  . BREAST BIOPSY Right 1980   BREAST EXCISIONAL BIOPSY  . BREAST BIOPSY Left 1970   BREAST EXCISIONAL BIOPSY  . BREAST CYST ASPIRATION    . CYSTECTOMY     abdomen  . EYE SURGERY Bilateral    cataract  . INSERTION OF MESH N/A 06/11/2017   Procedure: INSERTION OF MESH;  Surgeon: Rolm Bookbinder, MD;  Location: [facility];  Service: General;  Laterality: N/A;  BILATERAL TAP BLOCK  . LAPAROSCOPIC ASSISTED VENTRAL HERNIA REPAIR  06/11/2017    w/mesh  . TUBAL LIGATION  1970  . VENTRAL HERNIA REPAIR N/A 06/11/2017   Procedure: LAPAROSCOPIC VENTRAL HERNIA REPAIR WITH MESH ERAS PATHWAY;  Surgeon: Rolm Bookbinder, MD;  Location: [facility];  Service: General;  Laterality: N/A;  BILATERAL TAP BLOCK    Outpatient Medications Prior to Visit  Medication Sig Dispense Refill  . fish oil-omega-3 fatty acids 1000 MG capsule Take 1 g by mouth daily.      [name] losartan (COZAAR) 50 MG tablet Take 1 tablet (50 mg total) by mouth daily. 90 tablet 1  . magnesium gluconate (MAGONATE) 500 MG tablet Take 500 mg by mouth daily.    . Probiotic Product (PROBIOTIC-10 PO) Take 1 capsule by mouth daily.     [name] triamcinolone cream (KENALOG) 0.1 % Apply topically 2 (two) times daily. (Patient taking differently: Apply 1 application topically 2 (two) times daily as needed (in ears). ) 80 g 0   No facility-administered medications prior to visit.     Allergies  Allergen Reactions  . Nutrasweet Aspartame [Aspartame] Diarrhea  . Hydrochlorothiazide Cough  . Lisinopril Cough    ROS As per HPI  PE: Blood pressure 133/72, pulse 80, temperature 98 F (36.7 C), temperature source Oral, resp. rate 16, height 5\' 5"  (1.651 m), weight  182 lb 6 oz (82.7 kg), SpO2 97 %. VS: noted--normal. Gen: alert, NAD, NONTOXIC APPEARING. HEENT: eyes without injection, drainage, or swelling.  Ears: EACs clear, TMs with normal light reflex and landmarks.  Nose: Clear rhinorrhea, with some dried, crusty exudate adherent to mildly injected mucosa.  No purulent d/c.  No paranasal sinus TTP.  No facial swelling.  Throat and mouth without focal lesion.  No pharyngial swelling, erythema, or exudate.   Neck: supple, no LAD.   LUNGS: CTA bilat, nonlabored resps.  With forced expiration she has some end exp wheezing diffusely that does not clear entirely with forceful coughing.  Aeration is good.  Some excessive coughing after forced exhalation. CV: RRR, no m/r/g. EXT: no c/c/e SKIN: no  rash    LABS:    Chemistry      Component Value Date/Time   NA 138 09/01/2018 0916   NA 142 03/04/2017 1016   K 4.0 09/01/2018 0916   CL 105 09/01/2018 0916   CO2 27 09/01/2018 0916   BUN 19 09/01/2018 0916   BUN 13 03/04/2017 1016   CREATININE 0.75 09/01/2018 0916   CREATININE 0.78 05/20/2015 1704      Component Value Date/Time   CALCIUM 10.4 09/01/2018 0916   ALKPHOS 60 03/04/2018 0813   AST 15 03/04/2018 0813   ALT 10 03/04/2018 0813   BILITOT 0.9 03/04/2018 0813   BILITOT 0.9 03/04/2017 1016     Lab Results  Component Value Date   WBC 7.2 06/05/2018   HGB 14.3 06/05/2018   HCT 43.8 06/05/2018   MCV 97.1 06/05/2018   PLT 243.0 06/05/2018    IMPRESSION AND PLAN:  Viral URI with acute bronchitis and slight wheezing/RAD. I offered trial of prednisone but she declined due to past hx of significant insomnia and hyperactivity from taking this med.   Get otc generic robitussin DM OR Mucinex DM and use as directed on the packaging for cough and congestion. Use otc generic saline nasal spray 2-3 times per day to irrigate/moisturize your nasal passages. Hycodan syrup: 1-2 tsp po bid prn, #120 ml.  An After Visit Summary was printed and given to the patient.  FOLLOW UP: Return if symptoms worsen or fail to improve.  Signed:  Crissie Sickles, MD           09/19/2018

## 2018-11-04 ENCOUNTER — Ambulatory Visit: Payer: Medicare Other | Admitting: Family Medicine

## 2018-11-23 ENCOUNTER — Other Ambulatory Visit: Payer: Self-pay | Admitting: Family Medicine

## 2018-11-25 NOTE — Telephone Encounter (Signed)
Pt was called and given instructions to do phone visit. Pt does not feel comfortable doing a video visit as she does not know how to work computer. Pt will take BP med in the AM, then check BP 2 hours after and write down. Pt verbalizes understanding on instructions.

## 2018-11-25 NOTE — Telephone Encounter (Signed)
Please schedule pt for a video visit for her HTN. Please have her take her BP prior to visit and have it ready for [place] if able. If she is does not have access to a bp cuff, we can have her come for a BP check out outdoor tent the day prior to appt.

## 2018-11-26 ENCOUNTER — Other Ambulatory Visit: Payer: Self-pay | Admitting: Family Medicine

## 2018-11-26 MED ORDER — LOSARTAN POTASSIUM 50 MG PO TABS: 50.0000 mg | ORAL_TABLET | Freq: Every day | ORAL | 1 refills | Status: DC

## 2018-11-28 ENCOUNTER — Ambulatory Visit (INDEPENDENT_AMBULATORY_CARE_PROVIDER_SITE_OTHER): Payer: Medicare Other | Admitting: Family Medicine

## 2018-11-28 ENCOUNTER — Other Ambulatory Visit: Payer: Self-pay

## 2018-11-28 ENCOUNTER — Encounter: Payer: Self-pay | Admitting: Family Medicine

## 2018-11-28 VITALS — BP 127/70 | Ht 65.0 in | Wt 182.0 lb

## 2018-11-28 DIAGNOSIS — I10 Essential (primary) hypertension: Secondary | ICD-10-CM

## 2018-11-28 DIAGNOSIS — E663 Overweight: Secondary | ICD-10-CM

## 2018-11-28 NOTE — Progress Notes (Signed)
Virtual Visit via Telephone Note  I connected with Christina [name] on 11/28/18 at  1:00 PM EDT by telephone and verified that I am speaking with the correct person using two identifiers.   I discussed the limitations, risks, security and privacy concerns of performing an evaluation and management service by telephone and the availability of in person appointments. I also discussed with the patient that there may be a patient responsible charge related to this service. The patient expressed understanding and agreed to proceed.  Chief Complaint  Patient presents with  . Hypertension    No complaints. Pt has been wondering if she can take her BP med at night due to reading about it. Pt took BP 2hrs after taking meds     History of Present Illness: Hypertension/overweight/HLD: Pt reports  compliance with losartan 50 mg QD. Blood pressures ranges at home are normal. Patient denies chest pain, shortness of breath, dizziness or lower extremity edema.  Pt does not take a  daily baby ASA. Pt is not prescribed statin. She does take fish oil supplement. Mild hyperlipidemia 2015. CBC: 05/2018 WNL CMP: 09/01/2018 WNL TSH: 02/2018 WNL A1c: 5.7 02/2018 Lipid: 09/04/2017 total cholesterol 183, HDL 64, LDL 103, triglycerides 81 Diet: low sodium Exercise: routine RF: HTN, mild HLD, BMI > 25 former smoker    Observations/Objective: BP 127/70   Ht 5\' 5"  (1.651 m)   Wt 182 lb (82.6 kg)   BMI 30.29 kg/m  Gen: Afebrile. No acute distress.  Chest: No coughing or shortness of breath. Neuro:  Alert. Oriented x3  Psych: Normal affect, dress and demeanor. Normal speech. Normal thought content and judgment   Assessment and Plan: Christina [name] is a 78 y.o. female Essential hypertension, benign/Overweight (BMI 25.0-29.9) - Stable. Refills provided.  - Med: losartan 50 mg QD. Refilled.   - labs UTD--> next visit lab.  - f/U 6 months.   Follow Up Instructions:6 months     I discussed the assessment  and treatment plan with the patient. The patient was provided an opportunity to ask questions and all were answered. The patient agreed with the plan and demonstrated an understanding of the instructions.   The patient was advised to call back or seek an in-person evaluation if the symptoms worsen or if the condition fails to improve as anticipated.  I provided 13 minutes of non-face-to-face time during this encounter.   Howard Pouch, DO

## 2018-11-28 NOTE — Patient Instructions (Signed)
meds refilled. Next visit 6 mos. With provider. Fasting labs at that appt.   Telehealth visit.

## 2018-12-23 ENCOUNTER — Ambulatory Visit: Payer: Medicare Other

## 2019-01-07 ENCOUNTER — Ambulatory Visit (INDEPENDENT_AMBULATORY_CARE_PROVIDER_SITE_OTHER): Payer: Medicare Other | Admitting: Family Medicine

## 2019-01-07 ENCOUNTER — Encounter: Payer: Self-pay | Admitting: Family Medicine

## 2019-01-07 ENCOUNTER — Other Ambulatory Visit: Payer: Self-pay

## 2019-01-07 VITALS — Ht 65.0 in

## 2019-01-07 DIAGNOSIS — M79652 Pain in left thigh: Secondary | ICD-10-CM

## 2019-01-07 DIAGNOSIS — M25552 Pain in left hip: Secondary | ICD-10-CM | POA: Diagnosis not present

## 2019-01-07 DIAGNOSIS — R2689 Other abnormalities of gait and mobility: Secondary | ICD-10-CM | POA: Diagnosis not present

## 2019-01-07 NOTE — Progress Notes (Signed)
VIRTUAL VISIT VIA VIDEO  I connected with [name] on 01/07/19 at  2:00 PM EDT by a video enabled telemedicine application and verified that I am speaking with the correct person using two identifiers. Location patient: Home Location provider: [facility], Office Persons participating in the virtual visit: Patient, Dr. Raoul Pitch and R.Baker, LPN  I discussed the limitations of evaluation and management by telemedicine and the availability of in person appointments. The patient expressed understanding and agreed to proceed.   SUBJECTIVE Chief Complaint  Patient presents with   Leg Pain    L Leg pain almost at hip that started around Christmas. She thought she pulled a muscle due to lifting things but it has gotten worse. Hurts when she sits down but not when she is walking or standing.     HPI:  Christina [name] is a 78 y.o. female present for left anterior thigh/hip pain that started in December 2019.  She reports it initially started after she was lifting heavy boxes carrying them from the basement upstairs.  She figured she strained a muscle and it would go away.  She has tried heat and over-the-counter pain relievers without great success.  She reports she is still unable to bear weight on the leg when transitioning from sitting to standing.  She states when sitting she has to keep her leg straight for comfort and then when standing she has to put all of her weight on the right leg.  If she bears weight on the left leg when transitioning positions and standing or going up steps she has a sharp pain in her upper anterior thigh.  She points just inferior of her hip joint over her femur as location of pain.  She points it was never bruised, red or swollen in this area.  She denies any tenderness to palpation if she pushes over this area.  He has had no prior injury to her hip or femur in the past.  She has had some mild lower lumbar and left hip discomfort in the past, but  nothing recently and she reports her current symptoms are much different than anything she is experienced in the past. Bone density 2020 osteopenia left femoral neck -2.1.  ROS: See pertinent positives and negatives per HPI.  Patient Active Problem List   Diagnosis Date Noted   Overactive bladder 09/01/2018   Elevated hemoglobin A1c 03/06/2018   Tailor's bunionette, left 09/02/2017   Vitamin D deficiency 04/17/2017   Osteopenia 01/03/2017   Overweight (BMI 25.0-29.9) 10/07/2008   Essential hypertension, benign 10/13/2007    Social History   Tobacco Use   Smoking status: Former Smoker    Packs/day: 1.00    Years: 30.00    Pack years: 30.00    Types: Cigarettes    Last attempt to quit: 08/27/1990    Years since quitting: 28.3   Smokeless tobacco: Never Used  Substance Use Topics   Alcohol use: No    Current Outpatient Medications:    Cholecalciferol (DIALYVITE VITAMIN D 5000) 125 MCG (5000 UT) capsule, Take 5,000 Units by mouth daily., Disp: , Rfl:    fish oil-omega-3 fatty acids 1000 MG capsule, Take 1 g by mouth daily.  , Disp: , Rfl:    losartan (COZAAR) 50 MG tablet, Take 1 tablet (50 mg total) by mouth daily., Disp: 90 tablet, Rfl: 1   Probiotic Product (PROBIOTIC-10 PO), Take 1 capsule by mouth daily. , Disp: , Rfl:    triamcinolone cream (  KENALOG) 0.1 %, Apply topically 2 (two) times daily. (Patient taking differently: Apply 1 application topically 2 (two) times daily as needed (in ears). ), Disp: 80 g, Rfl: 0   magnesium gluconate (MAGONATE) 500 MG tablet, Take 500 mg by mouth daily., Disp: , Rfl:   Allergies  Allergen Reactions   Nutrasweet Aspartame [Aspartame] Diarrhea   Hydrochlorothiazide Cough   Lisinopril Cough    OBJECTIVE: Ht 5\' 5"  (1.651 m)    BMI 30.29 kg/m  Gen: No acute distress. Nontoxic in appearance.  HENT: AT. [place].  MMM.  Eyes:Pupils Equal Round Reactive to light, Extraocular movements intact,  Conjunctiva without redness,  discharge or icterus. Chest: Cough or shortness of breath not present Skin: No rashes, purpura or petechiae.  No bruising, redness or swelling to left anterior thigh. Neuro:  Alert. Oriented x3.  Pain with standing to left anterior thigh.  No tenderness to palpation over left anterior thigh.  No increased pain with abduction and abduction of hip.  No pain with hip flexion.  Neurovascularly intact distally. Psych: Normal affect, dress and demeanor. Normal speech. Normal thought content and judgment.  ASSESSMENT AND PLAN: BERENICE OEHLERT is a 78 y.o. female present for  Left thigh pain/Left hip pain/ Impaired weight bearing - Discussed DDX: hip arthritis vs ligament/muscle strain/tear vs femur fracture- she has a h/o osteopenia of that hip/femur.  - DG HIP UNILAT WITH PELVIS 2-3 VIEWS LEFT; Future - DG FEMUR MIN 2 VIEWS LEFT; Future - for now- caution with weight bearing- she has been doing well- no falls.  - images to be completed at [place] office. Pt made aware to wear her own mask if she has one and provided with address. She will be called with results and discuss further  plan at that time.   > 25 minutes spent with patient, >50% of time spent face to face counseling    Howard Pouch, DO 01/07/2019

## 2019-01-08 ENCOUNTER — Ambulatory Visit (INDEPENDENT_AMBULATORY_CARE_PROVIDER_SITE_OTHER): Payer: Medicare Other

## 2019-01-08 DIAGNOSIS — R2689 Other abnormalities of gait and mobility: Secondary | ICD-10-CM | POA: Diagnosis not present

## 2019-01-08 DIAGNOSIS — M79652 Pain in left thigh: Secondary | ICD-10-CM

## 2019-01-08 DIAGNOSIS — M25552 Pain in left hip: Secondary | ICD-10-CM | POA: Diagnosis not present

## 2019-01-09 ENCOUNTER — Telehealth: Payer: Self-pay | Admitting: Family Medicine

## 2019-01-09 MED ORDER — DICLOFENAC SODIUM ER 100 MG PO TB24: 100.0000 mg | ORAL_TABLET | Freq: Every day | ORAL | 5 refills | Status: DC

## 2019-01-09 NOTE — Telephone Encounter (Signed)
Patient is requesting that our office call her cell phone with her results.

## 2019-01-09 NOTE — Telephone Encounter (Signed)
Pt was called and given information and verbalized understanding. Stretches placed in envelope to be mailed.

## 2019-01-09 NOTE — Telephone Encounter (Signed)
Please inform patient the following information: Her xrays resulted with arthritic changes in her left hip - otherwise normal xray of hip and femur.  -Her pain could be coming from the hip joint itself from the arthritis or she may have strained a ligament.  Daily anti-inflammatory could potentially help with both of these conditions. - I would suggest first trying a daily anti-inflammatory called diclofenac.  I have prescribed this for her.  Take with a meal. - start GENTLE stretches of her lower extremity- printed to mail to her.  -If symptoms still present in 3 to 4 weeks follow-up and we will pursue further work-up.

## 2019-01-27 ENCOUNTER — Telehealth: Payer: Self-pay

## 2019-01-27 NOTE — Telephone Encounter (Signed)
Copied from [place] [id number]. Topic: General - Other >> Jan 27, 2019  2:41 PM Carolyn Stare wrote:  Pt call to say her left leg is still hurting medication is not helping and is asking what may be the next step  Pt was called and told she would need to follow up to pursue further work up if not any better. Pt states hips are not better and some days it is worse with a pain that shoots up her leg when she is ambulating. Pt also states she has a "knot" on her right elbow that she noticed last night. It can be "moved away from the bone" per pt and is harder. Is smaller today. Denies pain, trauma, redness, swelling, fever, or chills. Pt would like to come into the office if possible for visit. Please advise.   (pt requests detailed message be left on answering machine as to what type of appt she needs to make).

## 2019-01-28 NOTE — Telephone Encounter (Signed)
In office appt approved.

## 2019-01-28 NOTE — Telephone Encounter (Signed)
Pt was called and scheduled for in office visit.

## 2019-02-02 ENCOUNTER — Encounter: Payer: Self-pay | Admitting: Family Medicine

## 2019-02-02 ENCOUNTER — Other Ambulatory Visit: Payer: Self-pay

## 2019-02-02 ENCOUNTER — Ambulatory Visit (INDEPENDENT_AMBULATORY_CARE_PROVIDER_SITE_OTHER): Payer: Medicare Other | Admitting: Family Medicine

## 2019-02-02 VITALS — BP 142/78 | HR 82 | Temp 98.0°F | Resp 18 | Ht 65.0 in

## 2019-02-02 DIAGNOSIS — M79652 Pain in left thigh: Secondary | ICD-10-CM

## 2019-02-02 DIAGNOSIS — S76112D Strain of left quadriceps muscle, fascia and tendon, subsequent encounter: Secondary | ICD-10-CM | POA: Diagnosis not present

## 2019-02-02 DIAGNOSIS — M71329 Other bursal cyst, unspecified elbow: Secondary | ICD-10-CM | POA: Diagnosis not present

## 2019-02-02 NOTE — Progress Notes (Signed)
Christina [name] , 05/05/1941, 78 y.o., female MRN: [id number] Patient Care Team    Relationship Specialty Notifications Start End  Ma Hillock, DO PCP - General Family Medicine  09/02/17   Rolm Bookbinder, MD Consulting Physician General Surgery  10/05/16   Monna Fam, MD Consulting Physician Ophthalmology  10/05/16   Garry Heater, DDS Consulting Physician Dentistry  10/05/16   Juanita Craver, MD Consulting Physician Gastroenterology  09/02/17     Chief Complaint  Patient presents with  . Hip Pain    Pt continues to have hip pain. Took medication as RX with no relief   . Cyst    Pt noticed cyst on right elbow      Subjective: Pt presents for follow up on her left thigh pain and new right elbow mass.  Elbow mass:  New right elbow mass.  Patient reports she has the mass only because it was a little tender when putting direct pressure on her elbow.  She denies any redness, swelling, discomfort with moving her elbow or fever.  She reports it is not all that tender now even with direct pressure.  She has a history of having multiple cysts.  Left thigh pain: Patient reports her left anterior thigh pain has remained.  Her lateral hip x-ray showed evidence of arthritic changes only.  Femur x-ray also normal.  She did not feel the diclofenac was helpful at all.  She again states that the pain is daily, may last a little less time during occurrences.  Occurrences are still caused by transitioning from a sitting to standing position.  Pain is not present with stride.  Pain initially started in December after carrying heavy boxes.  See full note below. Prior appt:  Christina [name] is a 78 y.o. female present for left anterior thigh/hip pain that started in December 2019.  She reports it initially started after she was lifting heavy boxes carrying them from the basement upstairs.  She figured she strained a muscle and it would go away.  She has tried heat and over-the-counter pain relievers  without great success.  She reports she is still unable to bear weight on the leg when transitioning from sitting to standing.  She states when sitting she has to keep her leg straight for comfort and then when standing she has to put all of her weight on the right leg.  If she bears weight on the left leg when transitioning positions and standing or going up steps she has a sharp pain in her upper anterior thigh.  She points just inferior of her hip joint over her femur as location of pain.  She points it was never bruised, red or swollen in this area.  She denies any tenderness to palpation if she pushes over this area.  He has had no prior injury to her hip or femur in the past.  She has had some mild lower lumbar and left hip discomfort in the past, but nothing recently and she reports her current symptoms are much different than anything she is experienced in the past. Bone density 2020 osteopenia left femoral neck -2.1. Depression screen [facility] 2/9 06/05/2018 03/06/2018 12/17/2017 09/02/2017 03/04/2017  Decreased Interest 0 0 0 0 1  Down, Depressed, Hopeless 0 0 0 0 3  PHQ - 2 Score 0 0 0 0 4  Altered sleeping - - - - 2  Tired, decreased energy - - - - 2  Change in appetite - - - -  2  Feeling bad or failure about yourself  - - - - 0  Trouble concentrating - - - - 1  Moving slowly or fidgety/restless - - - - 0  Suicidal thoughts - - - - 0  PHQ-9 Score - - - - 11    Allergies  Allergen Reactions  . Nutrasweet Aspartame [Aspartame] Diarrhea  . Hydrochlorothiazide Cough  . Lisinopril Cough   Social History   Social History Narrative   Retired. Lives alone.    Attended some business college.    Former smoker.    Smoke alarm in the home. Wears seat balt.    Wears dentures.    Feels safe in her relationships.       Past Medical History:  Diagnosis Date  . Arthritis   . Asthma    a little?  . Cervical cancer screening 01/31/2012  . Chicken pox as a child  . Depression with anxiety  06/07/2009   Qualifier: Diagnosis of  By: [name] MD, [name]    . Dermatitis of external ear 07/11/2012  . Dysphagia 01/31/2012  . Hiatal hernia with gastroesophageal reflux 10/26/2010   Qualifier: Diagnosis of  By: [name] MD, [name]    . Hyperglycemia 06/21/2013  . Hyperlipidemia, mixed 10/16/2015  . Hypertension   . Left hip pain 07/10/2015  . Lesion of breast 12/03/2016   benign; resolved  . Low back pain 05/29/2015  . Measles as a child  . Mumps as a child  . Neck pain on left side 06/21/2013  . Osteopenia 01/03/2017  . Overweight (BMI 25.0-29.9) 10/07/2008   Qualifier: Diagnosis of  By: [name] MD, [name]    . Palpitations   . Prediabetes   . RUQ pain 05/29/2015  . Spinal stenosis   . Umbilical hernia   . Urinary frequency 09/28/2011  . Vertigo    Past Surgical History:  Procedure Laterality Date  . BREAST BIOPSY Right 1980   BREAST EXCISIONAL BIOPSY  . BREAST BIOPSY Left 1970   BREAST EXCISIONAL BIOPSY  . BREAST CYST ASPIRATION    . CYSTECTOMY     abdomen  . EYE SURGERY Bilateral    cataract  . INSERTION OF MESH N/A 06/11/2017   Procedure: INSERTION OF MESH;  Surgeon: Rolm Bookbinder, MD;  Location: [place];  Service: General;  Laterality: N/A;  BILATERAL TAP BLOCK  . LAPAROSCOPIC ASSISTED VENTRAL HERNIA REPAIR  06/11/2017   w/mesh  . TUBAL LIGATION  1970  . VENTRAL HERNIA REPAIR N/A 06/11/2017   Procedure: LAPAROSCOPIC VENTRAL HERNIA REPAIR WITH MESH ERAS PATHWAY;  Surgeon: Rolm Bookbinder, MD;  Location: [place];  Service: General;  Laterality: N/A;  BILATERAL TAP BLOCK   Family History  Problem Relation Age of Onset  . Hypertension Mother   . Anxiety disorder Mother   . Ovarian cancer Mother 72       lung- smoker, ovarian  . Lung cancer Mother        smoker  . Arthritis Mother   . Hyperlipidemia Mother   . Alcohol abuse Father   . Diabetes Sister   . Hypertension Sister   . Ovarian cancer Sister   . Arthritis Sister   . Depression Sister   .  Hyperlipidemia Sister   . Other Maternal Grandmother        pacemaker  . Multiple sclerosis Maternal Grandfather        ?  [name] Arthritis Brother   . COPD Brother   . Heart attack Brother   . Hyperlipidemia  Brother   . Stroke Brother   . Asthma Sister   . Depression Sister   . Hyperlipidemia Sister   . Asthma Sister   . Depression Sister   . Asthma Paternal Uncle    Allergies as of 02/02/2019      Reactions   Nutrasweet Aspartame [aspartame] Diarrhea   Hydrochlorothiazide Cough   Lisinopril Cough      Medication List       Accurate as of February 02, 2019 11:38 AM. If you have any questions, ask your nurse or doctor.        Dialyvite Vitamin D 5000 125 MCG (5000 UT) capsule Generic drug:  Cholecalciferol Take 5,000 Units by mouth daily.   Diclofenac Sodium CR 100 MG 24 hr tablet Take 1 tablet (100 mg total) by mouth daily.   fish oil-omega-3 fatty acids 1000 MG capsule Take 1 g by mouth daily.   losartan 50 MG tablet Commonly known as:  COZAAR Take 1 tablet (50 mg total) by mouth daily.   magnesium gluconate 500 MG tablet Commonly known as:  MAGONATE Take 500 mg by mouth daily.   PROBIOTIC-10 PO Take 1 capsule by mouth daily.   triamcinolone cream 0.1 % Commonly known as:  KENALOG Apply topically 2 (two) times daily. What changed:    how much to take  when to take this  reasons to take this       All past medical history, surgical history, allergies, family history, immunizations andmedications were updated in the EMR today and reviewed under the history and medication portions of their EMR.     ROS: Negative, with the exception of above mentioned in HPI   Objective:  BP (!) 142/78 (BP Location: Right Arm, Patient Position: Sitting, Cuff Size: Normal)   Pulse 82   Temp 98 F (36.7 C) (Temporal)   Resp 18   Ht 5\' 5"  (1.651 m)   SpO2 96%   BMI 30.29 kg/m  Body mass index is 30.29 kg/m. Gen: Afebrile. No acute distress. Nontoxic in appearance,  well developed, well nourished.  HENT: AT. [place]. MMM Eyes:Pupils Equal Round Reactive to light, Extraocular movements intact,  Conjunctiva without redness, discharge or icterus. MSK: No erythema, no soft tissue swelling.  Pain reproduced with transitioning to a standing position approximately 3-4 inches inferior to hip joint mid anterior thigh.  No tenderness to palpation over this area.  5/5 bilateral lower extremity muscle strength.  Negative straight leg raises bilaterally.  Negative FABRE bilaterally.  Neurovascular intact distally. Left elbow: Approximately 2 cm oval nontender mobile mass. Skin: No rashes, purpura or petechiae.  Neuro:Normal gait. PERLA. EOMi. Alert. Oriented x3 No exam data present No results found. No results found for this or any previous visit (from the past 24 hour(s)).  Assessment/Plan: MYLISSA LAMBE is a 78 y.o. female present for OV for  Strain of left quadriceps muscle, subsequent encounter/ Pain of left thigh -Uncertain etiology of her discomfort.  X-rays were normal outside of arthritic changes in the hips.  Voltaren was not helpful.  Pain appears to be ligament or muscle origin of her mid anterior thigh.  Pain only present when transitioning from seated to standing.  Was unable to reproduce pain today with full hip and lower extremity ranges of motion.  Pain was only reproduced upon standing.  Discussed options with her today and agreed to sports medicine referral for further evaluation of possible quadricep tear.  She is agreeable to this today. - Ambulatory referral to  Sports Medicine  Synovial cyst of elbow New cystic structure of her right elbow.  Is not causing her any pain or discomfort at this time.  She has a history of formation of many cysts in the past.  She can discuss with her sports medicine doctor if desired further work-up may be able to ultrasound.  - If Desiring cyst removal will be happy to refer to surgery for her.     Reviewed  expectations re: course of current medical issues.  Discussed self-management of symptoms.  Outlined signs and symptoms indicating need for more acute intervention.  Patient verbalized understanding and all questions were answered.  Patient received an After-Visit Summary.   > 25 minutes spent with patient, >50% of time spent face to face    No orders of the defined types were placed in this encounter.    Note is dictated utilizing voice recognition software. Although note has been proof read prior to signing, occasional typographical errors still can be missed. If any questions arise, please do not hesitate to call for verification.   electronically signed by:  Howard Pouch, DO  [place]

## 2019-02-02 NOTE — Patient Instructions (Signed)
Great to see you today.  I have referred you to sports medicine to further evaluate your left thigh and right elbow.

## 2019-02-09 ENCOUNTER — Other Ambulatory Visit: Payer: Self-pay

## 2019-02-09 ENCOUNTER — Ambulatory Visit: Payer: Medicare Other | Admitting: Family Medicine

## 2019-02-09 ENCOUNTER — Ambulatory Visit (INDEPENDENT_AMBULATORY_CARE_PROVIDER_SITE_OTHER): Payer: Medicare Other

## 2019-02-09 ENCOUNTER — Encounter: Payer: Self-pay | Admitting: Family Medicine

## 2019-02-09 VITALS — BP 145/84 | HR 77 | Temp 98.1°F | Wt 183.0 lb

## 2019-02-09 DIAGNOSIS — M79652 Pain in left thigh: Secondary | ICD-10-CM | POA: Diagnosis not present

## 2019-02-09 DIAGNOSIS — R2231 Localized swelling, mass and lump, right upper limb: Secondary | ICD-10-CM | POA: Diagnosis not present

## 2019-02-09 NOTE — Progress Notes (Signed)
Subjective:    I'm seeing this patient as a consultation for:  Howard Pouch A, DO   CC: Left thigh pain  HPI: Patient has had pain in the left anterior thigh since December 2019.  She was seen by her PCP who obtained x-rays showing hip arthritis.  She was thought to have perhaps a quadricep muscle strain and treated with Voltaren pills which did not help.  She was referred to sports medicine.  Pain started in December after lifting a Christmas tree.  She notes continued pain especially when she stands from a seated position.  She has some pain when she flexes forward.  She noted the pain started suddenly but has not resolved.  She denies weakness or numbness.  Additionally she notes a cystic structure in her right elbow that is nonpainful.  He was also asked to follow-up with sports medicine for musculoskeletal ultrasound.  This is been there for about 2 weeks.  She cannot recall any injury that may have started it.    Past medical history, Surgical history, Family history not pertinant except as noted below, Social history, Allergies, and medications have been entered into the medical record, reviewed, and no changes needed.   Review of Systems: No headache, visual changes, nausea, vomiting, diarrhea, constipation, dizziness, abdominal pain, skin rash, fevers, chills, night sweats, weight loss, swollen lymph nodes, body aches, joint swelling, muscle aches, chest pain, shortness of breath, mood changes, visual or auditory hallucinations.   Objective:    Vitals:   02/09/19 0952  BP: (!) 145/84  Pulse: 77  Temp: 98.1 F (36.7 C)   General: Well Developed, well nourished, and in no acute distress.  Neuro/Psych: Alert and oriented x3, extra-ocular muscles intact, able to move all 4 extremities, sensation grossly intact. Skin: Warm and dry, no rashes noted.  Respiratory: Not using accessory muscles, speaking in full sentences, trachea midline.  Cardiovascular: Pulses palpable, no  extremity edema. Abdomen: Does not appear distended. MSK:  L-spine: Nontender to spinal midline.  Normal lumbar motion.  Negative slump test bilaterally. Lower extremity strength intact except discussed below. Left hip: Normal-appearing normal motion nontender.  Hip strength is intact with exception of hip abduction which is slightly limited at 4/5. Right hip: Normal-appearing nontender normal motion.  Hip strength intact except for abduction which is 4/5. Right elbow: Small cystic structure at extensor elbow just proximal to olecranon.  Not tender mobile.  No erythema.   Lab and Radiology Results No results found for this or any previous visit (from the past 72 hour(s)). No results found.   Dg Hip Unilat With Pelvis 2-3 Views Left  Result Date: 01/08/2019 CLINICAL DATA:  Left hip pain for several months EXAM: DG HIP (WITH OR WITHOUT PELVIS) 3V LEFT COMPARISON:  None. FINDINGS: Degenerative changes of the hip joints are noted bilaterally. The pelvic ring is intact. No acute fracture or dislocation is seen. No soft tissue abnormality is noted. IMPRESSION: No acute abnormality noted. Electronically Signed   By: Inez Catalina M.D.   On: 01/08/2019 14:43   Dg Femur Min 2 Views Left  Result Date: 01/08/2019 CLINICAL DATA:  Leg pain for several months, no known injury, initial encounter EXAM: LEFT FEMUR 2 VIEWS COMPARISON:  None. FINDINGS: No acute fracture or dislocation is noted. Degenerative changes of left hip joint are seen. No acute bony abnormality is noted. IMPRESSION: Degenerative change without acute abnormality Electronically Signed   By: Inez Catalina M.D.   On: 01/08/2019 14:46   CT  scan images from 2018 reviewed showing scoliosis and degenerative changes of lumbar spine as well as cystic changes at hips bilaterally.  X-ray images L-spine personally independently reviewed Anterior listhesis at L1-L2 and degenerative changes present throughout lumbar spine.  Scoliotic appearance at L5-S1  stable appearing scoliosis changes. Await formal radiology review   I personally (independently) visualized and performed the interpretation of the images attached in this note.  Limited musculoskeletal ultrasound of right elbow: Small solid structure with hypoechoic fluid surrounding at right elbow superficial to triceps tendon insertion.  Measuring 1 x 0.6 cm.  No associated blood flow. Normal tendon and bony structures. Impression: Hematoma versus lipoma  Impression and Recommendations:    Assessment and Plan: 78 y.o. female with anterior thigh pain.  Possibly L2 to the radiculopathy but more likely intra-articular hip issue.  At this point pain is been going on for 7 months with no clear explanation.  Failing initial conservative management.  Plan for MRI arthrogram of hip.  If nondiagnostic would look at lumbar spine MRI.    Additionally the cystic structure of the elbow is possibly an irritated lipoma but more likely a hematoma.  Watchful waiting reassess with next in clinic with ultrasound if not improving likely will proceed with excisional biopsy.  PDMP not reviewed this encounter. Orders Placed This Encounter  Procedures   DG Lumbar Spine Complete    Standing Status:   Future    Number of Occurrences:   1    Standing Expiration Date:   04/10/2020    Order Specific Question:   Reason for Exam (SYMPTOM  OR DIAGNOSIS REQUIRED)    Answer:   eval lumbar spine possible L2 radiculopathy left    Order Specific Question:   Preferred imaging location?    Answer:   [facility]    Order Specific Question:   Radiology Contrast Protocol - do NOT remove file path    Answer:   [email or web address]   No orders of the defined types were placed in this encounter.   Discussed warning signs or symptoms. Please see discharge instructions. Patient expresses understanding.

## 2019-02-09 NOTE — Patient Instructions (Addendum)
Thank you for coming in today. Get xray lumbar spine now.  You should hear about hip MRI and injection.  We will discuss MRI results soon.  Recheck as needed sooner if needed.    Use an elbow pad on the cyst. We will look at it again on rechck If not better we can remove it.

## 2019-02-17 ENCOUNTER — Telehealth: Payer: Self-pay | Admitting: Family Medicine

## 2019-02-17 DIAGNOSIS — M79652 Pain in left thigh: Secondary | ICD-10-CM

## 2019-02-17 NOTE — Telephone Encounter (Signed)
Per last note you stated: "Thank you for coming in today. Get xray lumbar spine now.  You should hear about hip MRI and injection.  We will discuss MRI results soon.  Recheck as needed sooner if needed. "  I do not see where MRI was ordered, are we proceeding with this? Pt viewed X-ray results on MyChart

## 2019-02-17 NOTE — Telephone Encounter (Signed)
Pt called. She knows the result of her Xray and wants to know what is the next step? Thanks.

## 2019-02-18 ENCOUNTER — Telehealth: Payer: Self-pay

## 2019-02-18 NOTE — Telephone Encounter (Signed)
MRI arthrogram left hip ordered.  Once authorized patient should schedule with me 1 hour prior to hip MRI for arthrogram injection.

## 2019-02-18 NOTE — Telephone Encounter (Signed)
We are working to authorize and now we will try to get it scheduled ASAP.

## 2019-02-18 NOTE — Telephone Encounter (Signed)
Patient advised. States she is in a lot of pain and requesting MRI be scheduled ASAP (this Monday if possible)

## 2019-02-19 NOTE — Telephone Encounter (Signed)
Pt has been updated.  

## 2019-02-23 ENCOUNTER — Encounter: Payer: Self-pay | Admitting: Family Medicine

## 2019-02-23 ENCOUNTER — Ambulatory Visit (INDEPENDENT_AMBULATORY_CARE_PROVIDER_SITE_OTHER): Payer: Medicare Other

## 2019-02-23 ENCOUNTER — Ambulatory Visit (INDEPENDENT_AMBULATORY_CARE_PROVIDER_SITE_OTHER): Payer: Medicare Other | Admitting: Family Medicine

## 2019-02-23 ENCOUNTER — Other Ambulatory Visit: Payer: Self-pay

## 2019-02-23 VITALS — BP 149/68 | HR 78 | Temp 97.8°F | Wt 183.0 lb

## 2019-02-23 DIAGNOSIS — M79652 Pain in left thigh: Secondary | ICD-10-CM

## 2019-02-23 MED ORDER — GADOBUTROL 1 MMOL/ML IV SOLN
1.0000 mL | Freq: Once | INTRAVENOUS | Status: AC | PRN
  Administered 2019-02-23: 1 mL via INTRAVENOUS

## 2019-02-23 NOTE — Patient Instructions (Signed)
Thank you for coming in today. Call or go to the ER if you develop a large red swollen joint with extreme pain or oozing puss.  Get MRI now.   You should hear soon about results.

## 2019-02-23 NOTE — Progress Notes (Signed)
Patient presents to clinic today for previously arranged left hip injection prior to MRI arthrogram  Procedure: Real-time Ultrasound Guided Injection of left hip Device: GE Logiq E   Images permanently stored and available for review in the ultrasound unit. Verbal informed consent obtained.  Discussed risks and benefits of procedure. Warned about infection bleeding damage to structures skin hypopigmentation and fat atrophy among others. Patient expresses understanding and agreement Time-out conducted.   Noted no overlying erythema, induration, or other signs of local infection.   Skin prepped in a sterile fashion.   Local anesthesia: Topical Ethyl chloride.   With sterile technique and under real time ultrasound guidance:  4 mL of Marcaine and 80 mg of Depo-Medrol injected easily.  Syringe was exchanged and 0.1 mL of gadolinium contrast was injected followed by 5 mL of normal saline flush.  Injected easily.   Completed without difficulty   Pain partially resolved suggesting accurate placement of the medication.   Advised to call if fevers/chills, erythema, induration, drainage, or persistent bleeding.   Images permanently stored and available for review in the ultrasound unit.  Impression: Technically successful ultrasound guided injection.

## 2019-02-24 ENCOUNTER — Telehealth: Payer: Self-pay | Admitting: Family Medicine

## 2019-02-24 NOTE — Telephone Encounter (Signed)
-----   Message from Towana Badger, [place] sent at 02/24/2019 10:50 AM EDT ----- Pt reviewed results on MyChart. Please call pt and get scheduled for follow up with Dr Georgina Snell

## 2019-02-24 NOTE — Telephone Encounter (Signed)
Left message with information below for patient to call us back.

## 2019-02-26 ENCOUNTER — Encounter: Payer: Self-pay | Admitting: Family Medicine

## 2019-02-26 ENCOUNTER — Telehealth: Payer: Self-pay | Admitting: Family Medicine

## 2019-02-26 ENCOUNTER — Ambulatory Visit (INDEPENDENT_AMBULATORY_CARE_PROVIDER_SITE_OTHER): Payer: Medicare Other | Admitting: Family Medicine

## 2019-02-26 ENCOUNTER — Other Ambulatory Visit: Payer: Self-pay

## 2019-02-26 VITALS — BP 139/64 | HR 72 | Temp 97.7°F | Wt 179.0 lb

## 2019-02-26 DIAGNOSIS — M1612 Unilateral primary osteoarthritis, left hip: Secondary | ICD-10-CM | POA: Diagnosis not present

## 2019-02-26 DIAGNOSIS — M6740 Ganglion, unspecified site: Secondary | ICD-10-CM | POA: Diagnosis not present

## 2019-02-26 DIAGNOSIS — M79605 Pain in left leg: Secondary | ICD-10-CM | POA: Diagnosis not present

## 2019-02-26 DIAGNOSIS — M24152 Other articular cartilage disorders, left hip: Secondary | ICD-10-CM | POA: Insufficient documentation

## 2019-02-26 HISTORY — DX: Other articular cartilage disorders, left hip: M24.152

## 2019-02-26 HISTORY — DX: Ganglion, unspecified site: M67.40

## 2019-02-26 MED ORDER — TRAMADOL HCL 50 MG PO TABS: 50.0000 mg | ORAL_TABLET | Freq: Three times a day (TID) | ORAL | 0 refills | Status: DC | PRN

## 2019-02-26 NOTE — Patient Instructions (Signed)
Thank you for coming in today. Use tramadol sparingly for pain.  You should hear about MRI lumbar spine.  On recheck after lumbar MRI we will aspirate and inject the ganglion cyst on the right elbow.  I am delaying that to reduce total sterid dose this week.   I do think some or most of your pain is coming from the hip joint but before committing to a total hip replacement we should be more sure.

## 2019-02-26 NOTE — Telephone Encounter (Signed)
Called [employer] M[employer] to obtaine prior authorization for MRI lumbar spine w/o contrast. Case Number is [id number]. Spoke with Denice Bors and no Josem Kaufmann is required. KG LPN

## 2019-02-26 NOTE — Progress Notes (Signed)
Christina [name] is a 78 y.o. female who presents to [facility] today for left hip pain.  Patient has had left anterior thigh and hip pain for some time now.  She has had some trials of conservative management.  Ultimately saw her in mid June for consultation from her primary care provider.  At that time I proceeded to arrange for MRI arthrogram of left hip that she had symptoms consistent with hip pain with a relatively spared x-ray.  MR arthrogram was done on June 29.  During the arthrogram I injected Marcaine as well as Depo-Medrol.  In the interim Christina [name] notes that she continues to have significant hip and anterior thigh pain.  She notes some pain relief immediately following the injection but not substantial and notes that it has not lasted long at all.  She has not experienced any benefit from the steroid component of the injection yet after about 3 days.  MRI did show a small labrum tear as well as cartilage loss degenerative changes in the acetabulum.  She notes that her pain is most dominant right after she stands up.  She notes the pain can be very severe but then typically eases off after a little while.  Right elbow: Patient had right elbow mass previously evaluated.  She notes this is still somewhat bothersome but not severe.  She notes the mass at the anterior elbow is mildly tender.   ROS:  As above  Exam:  BP 139/64   Pulse 72   Temp 97.7 F (36.5 C) (Oral)   Wt 179 lb (81.2 kg)   BMI 29.79 kg/m  Wt Readings from Last 5 Encounters:  02/26/19 179 lb (81.2 kg)  02/23/19 183 lb (83 kg)  02/09/19 183 lb (83 kg)  11/28/18 182 lb (82.6 kg)  09/19/18 182 lb 6 oz (82.7 kg)   General: Well Developed, well nourished, and in no acute distress.  Neuro/Psych: Alert and oriented x3, extra-ocular muscles intact, able to move all 4 extremities, sensation grossly intact. Skin: Warm and dry, no rashes noted.  Respiratory: Not using  accessory muscles, speaking in full sentences, trachea midline.  Cardiovascular: Pulses palpable, no extremity edema. Abdomen: Does not appear distended. MSK:  Right elbow: Small subcutaneous nodule at extensor elbow.  Mildly tender.  No surrounding erythema or induration.  Freely mobile.  Left hip: Normal-appearing not particularly tender.  Normal motion. Intact strength. Significant pain with standing initially.  Patient has severe antalgic gait for a few steps then normalizes.  L-spine: Nontender to spinal midline.  Normal motion.  Negative straight leg raise test and negative slump test bilaterally.    Lab and Radiology Results  Limited musculoskeletal ultrasound right extensor elbow reveals small 1 cm cystic lesion associated with extensor tendon subcutaneous.  Normal bony structures. Impression: Ganglion cyst.   Dg Lumbar Spine Complete  Result Date: 02/09/2019 CLINICAL DATA:  Left upper leg pain since December, 2019. No known injury. EXAM: LUMBAR SPINE - COMPLETE 4+ VIEW COMPARISON:  Plain films lumbar spine 05/20/2015. FINDINGS: Vertebral body height and alignment are maintained. Intervertebral disc space height is normal. Facet degenerative disease L3-4, L4-5 and L5-S1 appears unchanged. Scattered mild anterior endplate spurring is noted. Paraspinous structures demonstrate atherosclerosis. IMPRESSION: No acute abnormality. No change in lower lumbar facet degenerative disease. Electronically Signed   By: Inge Rise M.D.   On: 02/09/2019 15:31   Mr Hip Left W Contrast  Result Date: 02/23/2019 CLINICAL DATA:  Left thigh  pain.  Chronic hip pain. EXAM: MRI OF THE LEFT HIP WITH CONTRAST (MR Arthrogram) TECHNIQUE: Multiplanar, multisequence MR imaging of the hip was performed immediately following contrast injection into the hip joint under fluoroscopic guidance. No intravenous contrast was administered. COMPARISON:  Radiographs dated 01/08/2019 FINDINGS: Labrum: There is a focal  tear of the anterior aspect of the labrum best seen on image 19 of series 11. Contrast extends through the tear deep to the iliopsoas tendon. There is degeneration of the superolateral aspect of the acetabulum without a tear in that area. Bones: There are focal small cystic degenerative changes of the anterior and superolateral aspects of the left acetabulum. Note is made of moderate bilateral facet arthritis at L5-S1 and L4-5. Cartilage: There is slight thinning of the articular cartilage in the anterior superior aspect of the acetabulum. Muscles and tendons: Normal. Soft tissues: No evidence of bursitis. IMPRESSION: 1. Focal tear of the anterior aspect of the labrum of the left hip. 2. Arthritic changes of the anterior and superolateral aspects of the left acetabulum with partial thickness cartilage loss. Electronically Signed   By: Lorriane Shire M.D.   On: 02/23/2019 20:16   Dg Hip Unilat With Pelvis 2-3 Views Left  Result Date: 01/08/2019 CLINICAL DATA:  Left hip pain for several months EXAM: DG HIP (WITH OR WITHOUT PELVIS) 3V LEFT COMPARISON:  None. FINDINGS: Degenerative changes of the hip joints are noted bilaterally. The pelvic ring is intact. No acute fracture or dislocation is seen. No soft tissue abnormality is noted. IMPRESSION: No acute abnormality noted. Electronically Signed   By: Inez Catalina M.D.   On: 01/08/2019 14:43   Dg Femur Min 2 Views Left  Result Date: 01/08/2019 CLINICAL DATA:  Leg pain for several months, no known injury, initial encounter EXAM: LEFT FEMUR 2 VIEWS COMPARISON:  None. FINDINGS: No acute fracture or dislocation is noted. Degenerative changes of left hip joint are seen. No acute bony abnormality is noted. IMPRESSION: Degenerative change without acute abnormality Electronically Signed   By: Inez Catalina M.D.   On: 01/08/2019 14:46     I personally (independently) visualized and performed the interpretation of the images attached in this note.    Assessment and  Plan: 78 y.o. female with  Left anterior hip and thigh pain.  Pain is severe at times and significantly interfering with quality of life.  Patient certainly does have pathology at anterior superior labrum and acetabulum on MRI arthrogram.  This could account for a lot of pain however she did not have complete resolution of pain with Marcaine injection to her hip.  It is a little too early to tell if the steroid component that I injected on Monday is going to help her not.  She is in a bit of a dilemma.  She has severe pain but not severe findings on imaging so far.  I think a lot of her pain is probably coming from her hip but I am not 100% convinced.  The next best treatment option for hip would be likely total hip replacement.  This is obviously a big step.  Competing potential diagnoses for anterior thigh pain is lumbar radiculopathy at L2-3.  She does have degenerative changes at these level seen on lumbar spine x-ray.  Given not complete response of pain to injection and not severe pathology seen on MRI arthrogram hip I think next best step is MRI L-spine to fully evaluate for lumbar radiculopathy.  Use limited tramadol for severe pain.  Additionally  patient has a ganglion cyst at the right extensor elbow.  This is uncomfortable in obnoxious.  This should be treated with aspiration and injection.  However she recently had steroid injection I like to reduce the total steroids over 1 week period.  Patient returns for follow-up L-spine MRI we will aspirate and inject elbow cyst as well.  PDMP reviewed during this encounter. Orders Placed This Encounter  Procedures  . MR Lumbar Spine Wo Contrast    Standing Status:   Future    Standing Expiration Date:   04/28/2020    Order Specific Question:   What is the patient's sedation requirement?    Answer:   No Sedation    Order Specific Question:   Does the patient have a pacemaker or implanted devices?    Answer:   No    Order Specific Question:    Preferred imaging location?    Answer:   Product/process development scientist (table limit-350lbs)    Order Specific Question:   Radiology Contrast Protocol - do NOT remove file path    Answer:   [email or web address]   Meds ordered this encounter  Medications  . traMADol (ULTRAM) 50 MG tablet    Sig: Take 1 tablet (50 mg total) by mouth every 8 (eight) hours as needed for severe pain.    Dispense:  15 tablet    Refill:  0    Historical information moved to improve visibility of documentation.  Past Medical History:  Diagnosis Date  . Arthritis   . Asthma    a little?  . Cervical cancer screening 01/31/2012  . Chicken pox as a child  . Degenerative tear of acetabular labrum of left hip 02/26/2019  . Depression with anxiety 06/07/2009   Qualifier: Diagnosis of  By: [name] MD, [name]    . Dermatitis of external ear 07/11/2012  . Dysphagia 01/31/2012  . Hiatal hernia with gastroesophageal reflux 10/26/2010   Qualifier: Diagnosis of  By: [name] MD, [name]    . Hyperglycemia 06/21/2013  . Hyperlipidemia, mixed 10/16/2015  . Hypertension   . Left hip pain 07/10/2015  . Lesion of breast 12/03/2016   benign; resolved  . Low back pain 05/29/2015  . Measles as a child  . Mumps as a child  . Neck pain on left side 06/21/2013  . Osteopenia 01/03/2017  . Overweight (BMI 25.0-29.9) 10/07/2008   Qualifier: Diagnosis of  By: [name] MD, [name]    . Palpitations   . Prediabetes   . RUQ pain 05/29/2015  . Spinal stenosis   . Umbilical hernia   . Urinary frequency 09/28/2011  . Vertigo    Past Surgical History:  Procedure Laterality Date  . BREAST BIOPSY Right 1980   BREAST EXCISIONAL BIOPSY  . BREAST BIOPSY Left 1970   BREAST EXCISIONAL BIOPSY  . BREAST CYST ASPIRATION    . CYSTECTOMY     abdomen  . EYE SURGERY Bilateral    cataract  . INSERTION OF MESH N/A 06/11/2017   Procedure: INSERTION OF MESH;  Surgeon: Rolm Bookbinder, MD;  Location: [facility];  Service:  General;  Laterality: N/A;  BILATERAL TAP BLOCK  . LAPAROSCOPIC ASSISTED VENTRAL HERNIA REPAIR  06/11/2017   w/mesh  . TUBAL LIGATION  1970  . VENTRAL HERNIA REPAIR N/A 06/11/2017   Procedure: LAPAROSCOPIC VENTRAL HERNIA REPAIR WITH MESH ERAS PATHWAY;  Surgeon: Rolm Bookbinder, MD;  Location: [facility];  Service: General;  Laterality: N/A;  BILATERAL TAP BLOCK   Social History   Tobacco Use  .  Smoking status: Former Smoker    Packs/day: 1.00    Years: 30.00    Pack years: 30.00    Types: Cigarettes    Quit date: 08/27/1990    Years since quitting: 28.5  . Smokeless tobacco: Never Used  Substance Use Topics  . Alcohol use: No   family history includes Alcohol abuse in her father; Anxiety disorder in her mother; Arthritis in her brother, mother, and sister; Asthma in her paternal uncle, sister, and sister; COPD in her brother; Depression in her sister, sister, and sister; Diabetes in her sister; Heart attack in her brother; Hyperlipidemia in her brother, mother, sister, and sister; Hypertension in her mother and sister; Lung cancer in her mother; Multiple sclerosis in her maternal grandfather; Other in her maternal grandmother; Ovarian cancer in her sister; Ovarian cancer (age of onset: 78) in her mother; Stroke in her brother.  Medications: Current Outpatient Medications  Medication Sig Dispense Refill  . Cholecalciferol (DIALYVITE VITAMIN D 5000) 125 MCG (5000 UT) capsule Take 5,000 Units by mouth daily.    . Diclofenac Sodium CR 100 MG 24 hr tablet Take 1 tablet (100 mg total) by mouth daily. 30 tablet 5  . fish oil-omega-3 fatty acids 1000 MG capsule Take 1 g by mouth daily.      [name] losartan (COZAAR) 50 MG tablet Take 1 tablet (50 mg total) by mouth daily. 90 tablet 1  . magnesium gluconate (MAGONATE) 500 MG tablet Take 500 mg by mouth daily.    . Probiotic Product (PROBIOTIC-10 PO) Take 1 capsule by mouth daily.     [name] triamcinolone cream (KENALOG) 0.1 % Apply topically 2 (two) times  daily. (Patient taking differently: Apply 1 application topically 2 (two) times daily as needed (in ears). ) 80 g 0  . traMADol (ULTRAM) 50 MG tablet Take 1 tablet (50 mg total) by mouth every 8 (eight) hours as needed for severe pain. 15 tablet 0   No current facility-administered medications for this visit.    Allergies  Allergen Reactions  . Nutrasweet Aspartame [Aspartame] Diarrhea  . Hydrochlorothiazide Cough  . Lisinopril Cough      Discussed warning signs or symptoms. Please see discharge instructions. Patient expresses understanding.

## 2019-03-01 ENCOUNTER — Ambulatory Visit (INDEPENDENT_AMBULATORY_CARE_PROVIDER_SITE_OTHER): Payer: Medicare Other

## 2019-03-01 ENCOUNTER — Other Ambulatory Visit: Payer: Self-pay

## 2019-03-01 DIAGNOSIS — M79605 Pain in left leg: Secondary | ICD-10-CM

## 2019-03-02 ENCOUNTER — Encounter: Payer: Self-pay | Admitting: Family Medicine

## 2019-03-02 ENCOUNTER — Ambulatory Visit: Payer: Self-pay

## 2019-03-02 ENCOUNTER — Other Ambulatory Visit: Payer: Self-pay

## 2019-03-02 ENCOUNTER — Ambulatory Visit (INDEPENDENT_AMBULATORY_CARE_PROVIDER_SITE_OTHER): Payer: Medicare Other | Admitting: Family Medicine

## 2019-03-02 VITALS — BP 131/70 | HR 68 | Temp 97.2°F | Resp 16 | Wt 181.6 lb

## 2019-03-02 DIAGNOSIS — R310 Gross hematuria: Secondary | ICD-10-CM | POA: Diagnosis not present

## 2019-03-02 DIAGNOSIS — R82998 Other abnormal findings in urine: Secondary | ICD-10-CM | POA: Diagnosis not present

## 2019-03-02 DIAGNOSIS — R319 Hematuria, unspecified: Secondary | ICD-10-CM

## 2019-03-02 LAB — POCT URINALYSIS DIPSTICK
Bilirubin, UA: NEGATIVE
Blood, UA: NEGATIVE
Glucose, UA: NEGATIVE
Ketones, UA: NEGATIVE
Nitrite, UA: NEGATIVE
Protein, UA: NEGATIVE
Spec Grav, UA: 1.015 (ref 1.010–1.025)
Urobilinogen, UA: 0.2 E.U./dL
pH, UA: 6 (ref 5.0–8.0)

## 2019-03-02 NOTE — Telephone Encounter (Signed)
Pt. Called to report bright red blood in urine x 3 episodes since Saturday night; reported one episode on Sat. Eve, and once on Sun. AM, and again this AM.  Denied burning or frequency of urination.  Feels she is urinating in adequate amts.  Denied fever.  Reported has had back pain for years, and since December, has been evaluated for left lower back pain and left leg pain.  Voiced concern that the blood in urine could be related to the left back and leg pain.    Reason for Disposition . Blood in urine  (Exception: could be normal menstrual bleeding)  Answer Assessment - Initial Assessment Questions 1. COLOR of URINE: "Describe the color of the urine."  (e.g., tea-colored, pink, red, blood clots, bloody)     Red in toilet bowl 2. ONSET: "When did the bleeding start?"      Saturday evening  3. EPISODES: "How many times has there been blood in the urine?" or "How many times today?"     3 times since Saturday evening 4. PAIN with URINATION: "Is there any pain with passing your urine?" If so, ask: "How bad is the pain?"  (Scale 1-10; or mild, moderate, severe)    - MILD - complains slightly about urination hurting    - MODERATE - interferes with normal activities      - SEVERE - excruciating, unwilling or unable to urinate because of the pain      Denied any pain  5. FEVER: "Do you have a fever?" If so, ask: "What is your temperature, how was it measured, and when did it start?"    Denied  6. ASSOCIATED SYMPTOMS: "Are you passing urine more frequently than usual?"    7. OTHER SYMPTOMS: "Do you have any other symptoms?" (e.g., back/flank pain, abdominal pain, vomiting)      Left lower back pain, and left leg pain since December  8. PREGNANCY: "Is there any chance you are pregnant?" "When was your last menstrual period?"    N/a  Protocols used: URINE - BLOOD IN-A-AH

## 2019-03-02 NOTE — Progress Notes (Signed)
OFFICE VISIT  03/02/2019   CC:  Chief Complaint  Patient presents with  . Hematuria    x3 days, mainly in mornings   HPI:    Patient is a 78 y.o. Caucasian female who presents for "blood in urine". 2 nights ago she noted blood in her urine, without pain or change in her chronic urinary urgency or frequency. It happened 2 more times over the last 2 days.  She denies flank pain, abd pain, groin pain, fever, or nausea. Interestingly, she says the chronic L hip and thigh pain she has been having (and no one has been able to determine etiology) resolved right after she peed blood. She has remote hist of smoking cigs: 30 pack-yr hx, quit over 25 yrs ago. No hx of exposure to chemicals.  No FH of bladder or renal cancer.   Review of EMR shows no UAs with microhematuria.  Urine cultures on 3 occasions have either shown insignificant growth or no growth. No hx of kidney stones noted in EMR.  Past Medical History:  Diagnosis Date  . Arthritis   . Asthma    a little?  . Cervical cancer screening 01/31/2012  . Chicken pox as a child  . Degenerative tear of acetabular labrum of left hip 02/26/2019  . Depression with anxiety 06/07/2009   Qualifier: Diagnosis of  By: [name] MD, [name]    . Dermatitis of external ear 07/11/2012  . Dysphagia 01/31/2012  . Hiatal hernia with gastroesophageal reflux 10/26/2010   Qualifier: Diagnosis of  By: [name] MD, [name]    . Hyperglycemia 06/21/2013  . Hyperlipidemia, mixed 10/16/2015  . Hypertension   . Left hip pain 07/10/2015  . Lesion of breast 12/03/2016   benign; resolved  . Low back pain 05/29/2015  . Measles as a child  . Mumps as a child  . Neck pain on left side 06/21/2013  . Osteopenia 01/03/2017  . Overweight (BMI 25.0-29.9) 10/07/2008   Qualifier: Diagnosis of  By: [name] MD, [name]    . Palpitations   . Prediabetes   . RUQ pain 05/29/2015  . Spinal stenosis   . Umbilical hernia   . Urinary frequency 09/28/2011  . Vertigo      Past Surgical History:  Procedure Laterality Date  . BREAST BIOPSY Right 1980   BREAST EXCISIONAL BIOPSY  . BREAST BIOPSY Left 1970   BREAST EXCISIONAL BIOPSY  . BREAST CYST ASPIRATION    . CYSTECTOMY     abdomen  . EYE SURGERY Bilateral    cataract  . INSERTION OF MESH N/A 06/11/2017   Procedure: INSERTION OF MESH;  Surgeon: Rolm Bookbinder, MD;  Location: [facility];  Service: General;  Laterality: N/A;  BILATERAL TAP BLOCK  . LAPAROSCOPIC ASSISTED VENTRAL HERNIA REPAIR  06/11/2017   w/mesh  . TUBAL LIGATION  1970  . VENTRAL HERNIA REPAIR N/A 06/11/2017   Procedure: LAPAROSCOPIC VENTRAL HERNIA REPAIR WITH MESH ERAS PATHWAY;  Surgeon: Rolm Bookbinder, MD;  Location: [facility];  Service: General;  Laterality: N/A;  BILATERAL TAP BLOCK    Outpatient Medications Prior to Visit  Medication Sig Dispense Refill  . Cholecalciferol (DIALYVITE VITAMIN D 5000) 125 MCG (5000 UT) capsule Take 5,000 Units by mouth daily.    . fish oil-omega-3 fatty acids 1000 MG capsule Take 1 g by mouth daily.      [name] losartan (COZAAR) 50 MG tablet Take 1 tablet (50 mg total) by mouth daily. 90 tablet 1  . Probiotic Product (PROBIOTIC-10 PO) Take 1  capsule by mouth daily.     [name] triamcinolone cream (KENALOG) 0.1 % Apply topically 2 (two) times daily. (Patient taking differently: Apply 1 application topically 2 (two) times daily as needed (in ears). ) 80 g 0  . Diclofenac Sodium CR 100 MG 24 hr tablet Take 1 tablet (100 mg total) by mouth daily. (Patient not taking: Reported on 03/02/2019) 30 tablet 5  . magnesium gluconate (MAGONATE) 500 MG tablet Take 500 mg by mouth daily.    . traMADol (ULTRAM) 50 MG tablet Take 1 tablet (50 mg total) by mouth every 8 (eight) hours as needed for severe pain. (Patient not taking: Reported on 03/02/2019) 15 tablet 0   No facility-administered medications prior to visit.     Allergies  Allergen Reactions  . Nutrasweet Aspartame [Aspartame] Diarrhea  . Hydrochlorothiazide Cough   . Lisinopril Cough    ROS As per HPI  PE: Blood pressure 131/70, pulse 68, temperature (!) 97.2 F (36.2 C), temperature source Temporal, resp. rate 16, weight 181 lb 9.6 oz (82.4 kg), SpO2 94 %. Body mass index is 30.22 kg/m.  Exam chaperoned by Caroll Rancher, LPN Gen: Alert, well appearing.  Patient is oriented to person, place, time, and situation. AFFECT: pleasant, lucid thought and speech. CV: RRR, no m/r/g.   LUNGS: CTA bilat, nonlabored resps, good aeration in all lung fields. ABD: soft, NT, ND, BS normal.  No hepatospenomegaly or mass.  No bruits. No CVA tenderness. No groin tenderness.  No hip tenderness or thigh tenderness. She had full ROM of L hip w/out pain.  LABS:    Chemistry      Component Value Date/Time   NA 138 09/01/2018 0916   NA 142 03/04/2017 1016   K 4.0 09/01/2018 0916   CL 105 09/01/2018 0916   CO2 27 09/01/2018 0916   BUN 19 09/01/2018 0916   BUN 13 03/04/2017 1016   CREATININE 0.75 09/01/2018 0916   CREATININE 0.78 05/20/2015 1704      Component Value Date/Time   CALCIUM 10.4 09/01/2018 0916   ALKPHOS 60 03/04/2018 0813   AST 15 03/04/2018 0813   ALT 10 03/04/2018 0813   BILITOT 0.9 03/04/2018 0813   BILITOT 0.9 03/04/2017 1016     Lab Results  Component Value Date   WBC 7.2 06/05/2018   HGB 14.3 06/05/2018   HCT 43.8 06/05/2018   MCV 97.1 06/05/2018   PLT 243.0 06/05/2018   Lab Results  Component Value Date   HGBA1C 5.9 03/04/2018   CC Dipstick UA today: Normal except 1+ leuks.  Urine appeared normal yellow color.  IMPRESSION AND PLAN:  Painless gross hematuria: needs w/u to r/o urinary mass. She has no sx's of stone (unless she has a bladder stone). I can't explain why her left hip and thigh pain have resolved since having gross hematuria. Refer to urology ordered.  She preferred to see Urologist before having me order an abd/pelv CT w and w/out IV contrast. Will send urine for culture and urine myoglobin for completeness  sake. Plan discussed with pt and she is in full agreement.  An After Visit Summary was printed and given to the patient.  FOLLOW UP: Return for to be determined based on results of urology w/u.  Signed:  Crissie Sickles, MD           03/02/2019

## 2019-03-03 ENCOUNTER — Telehealth: Payer: Self-pay | Admitting: Neurology

## 2019-03-03 LAB — URINE CULTURE
MICRO NUMBER:: 637389
SPECIMEN QUALITY:: ADEQUATE

## 2019-03-03 LAB — MYOGLOBIN, URINE

## 2019-03-03 LAB — EXTRA URINE SPECIMEN

## 2019-03-03 NOTE — Telephone Encounter (Signed)
Patient left vm asking for call back about MR results from Sunday. [phone].

## 2019-03-03 NOTE — Telephone Encounter (Signed)
Results of MR: Notes recorded by Gregor Hams, MD on 03/02/2019 at 7:05 AM EDT  MRI lumbar spine does show some bulging disc at L2-3 but no significant compressive nerves. There are other bulging disks and possibly a pinched nerve on the left L5-S1 and possibly affecting the L5 nerve root however this would cause pain down the outside part of the calf and not to the thigh. I think this MRI for the most part rules out lumbar spine as a cause of thigh pain. Have you had any benefit from the injection?  Spoke with patient. She states no benefit from injection until yesterday. She states had some vaginal bleeding Saturday, Sunday, and Monday and on that day (Monday) her leg suddenly stopped hurting. She can now stand/walk on leg with no issues. Every once in awhile after standing suddenly she will get a little bit of pain, but nothing compared to how she was feeling. She is seeing another doctor about vaginal bleeding.   Dr. Georgina Snell - FYI.

## 2019-03-04 NOTE — Telephone Encounter (Signed)
Patient advised of recommendations.  

## 2019-03-04 NOTE — Telephone Encounter (Signed)
The benefit you are feeling is probably because of the hip injection. If the pain returns we can repeat the injection. However your response if further evidence that your pain is coming from the hip. If the injections fail to provide lasting benefit then the next option is total hip replacement.

## 2019-03-10 ENCOUNTER — Telehealth: Payer: Self-pay | Admitting: Family Medicine

## 2019-03-10 DIAGNOSIS — M24152 Other articular cartilage disorders, left hip: Secondary | ICD-10-CM

## 2019-03-10 NOTE — Telephone Encounter (Signed)
Dr. Aura Fey called and would like for you to refer her to Dr. Alvan Dame at [facility] due to her left hip.  I will send as soon as you put it in.   Thank you Jenny Reichmann

## 2019-03-11 NOTE — Telephone Encounter (Signed)
Thank you! Referral sent - CF

## 2019-03-11 NOTE — Telephone Encounter (Signed)
Referral to orthopedics as patient requested ordered.

## 2019-04-20 ENCOUNTER — Telehealth: Payer: Self-pay | Admitting: Family Medicine

## 2019-04-20 ENCOUNTER — Other Ambulatory Visit: Payer: Self-pay | Admitting: Urology

## 2019-04-20 NOTE — Telephone Encounter (Signed)
She can be a work in this week only. Would not bring her in same week as surgery 2/2 to pandemic etc and upcoming surgery.

## 2019-04-20 NOTE — Telephone Encounter (Signed)
scheduled patient at [facility] 04/21/2019. Pt verbalized understanding

## 2019-04-20 NOTE — Telephone Encounter (Signed)
Pt is having severe anxiety due to upcoming bladder cancer surgery. Surgery is scheduled 04/27/2019. Pt called surgeon and they said to call PCP. Pt states her nerves are making it very difficult to function. She would like something to take just until surgery Monday.   Pt asking to come in office for spot on arm, thinks something could be in it or infected still. Pt did E visit several weeks ago and got abx. Pt states she feels there is still something in there, hard knot.  Is a in office visit okay, refuses VV? You have no openings, can she be a work in and come into office? Please advise.

## 2019-04-20 NOTE — Telephone Encounter (Signed)
Patient is requesting an in person visit (no appointments available). She declined virtual visit. Patient states she has spot on her arm that seems infected and also she does not feel like she has gotten any better since her 12/2018 appointment (patient did not give details). Patient states she is unable to sleep and needs an Rx. Patient requesting call back.

## 2019-04-21 ENCOUNTER — Other Ambulatory Visit: Payer: Self-pay

## 2019-04-21 ENCOUNTER — Ambulatory Visit (INDEPENDENT_AMBULATORY_CARE_PROVIDER_SITE_OTHER): Payer: Medicare Other | Admitting: Family Medicine

## 2019-04-21 ENCOUNTER — Encounter: Payer: Self-pay | Admitting: Family Medicine

## 2019-04-21 VITALS — BP 139/83 | HR 76 | Temp 97.8°F | Resp 18 | Ht 65.0 in | Wt 178.4 lb

## 2019-04-21 DIAGNOSIS — F418 Other specified anxiety disorders: Secondary | ICD-10-CM | POA: Insufficient documentation

## 2019-04-21 DIAGNOSIS — N329 Bladder disorder, unspecified: Secondary | ICD-10-CM | POA: Diagnosis not present

## 2019-04-21 MED ORDER — LORAZEPAM 0.5 MG PO TABS: 0.5000 mg | ORAL_TABLET | Freq: Two times a day (BID) | ORAL | 2 refills | Status: DC | PRN

## 2019-04-21 NOTE — Patient Instructions (Addendum)
I have called in ativan you can take up to every 12 hours if needed.  I also gave you one refill. If needing additional refills will need to be seen on face to face.

## 2019-04-21 NOTE — Progress Notes (Signed)
Christina [name] , Mar 27, 1941, 78 y.o., female MRN: [id number] Patient Care Team    Relationship Specialty Notifications Start End  Ma Hillock, DO PCP - General Family Medicine  09/02/17   Rolm Bookbinder, MD Consulting Physician General Surgery  10/05/16   Monna Fam, MD Consulting Physician Ophthalmology  10/05/16   Garry Heater, DDS Consulting Physician Dentistry  10/05/16   Juanita Craver, MD Consulting Physician Gastroenterology  09/02/17     Chief Complaint  Patient presents with  . Anxiety    Pt had CT scan by urologist and was dx with cancer. Has surgery Monday. She is wanting medications to help with anxiety.      Subjective: Pt presents for an OV with complaints of creased anxiety secondary to new diagnosis of bladder cancer.  She is having an upcoming transurethral resection of lesion of the bladder next week.  She reports she has been extremely anxious and having difficulty sleeping.  She states she is in a constant worry over this diagnosis.  She is not interested in a medication that she would have to take every day.  She had been on benzodiazepine in the past when her husband passed away.  Depression screen [facility] 2/9 06/05/2018 03/06/2018 12/17/2017 09/02/2017 03/04/2017  Decreased Interest 0 0 0 0 1  Down, Depressed, Hopeless 0 0 0 0 3  PHQ - 2 Score 0 0 0 0 4  Altered sleeping - - - - 2  Tired, decreased energy - - - - 2  Change in appetite - - - - 2  Feeling bad or failure about yourself  - - - - 0  Trouble concentrating - - - - 1  Moving slowly or fidgety/restless - - - - 0  Suicidal thoughts - - - - 0  PHQ-9 Score - - - - 11    Allergies  Allergen Reactions  . Nutrasweet Aspartame [Aspartame] Diarrhea  . Hydrochlorothiazide Cough  . Lisinopril Cough   Social History   Social History Narrative   Retired. Lives alone.    Attended some business college.    Former smoker.    Smoke alarm in the home. Wears seat balt.    Wears dentures.    Feels safe in  her relationships.       Past Medical History:  Diagnosis Date  . Arthritis   . Asthma    a little?  . Cervical cancer screening 01/31/2012  . Chicken pox as a child  . Degenerative tear of acetabular labrum of left hip 02/26/2019  . Depression with anxiety 06/07/2009   Qualifier: Diagnosis of  By: [name] MD, [name]    . Dermatitis of external ear 07/11/2012  . Dysphagia 01/31/2012  . Hiatal hernia with gastroesophageal reflux 10/26/2010   Qualifier: Diagnosis of  By: [name] MD, [name]    . Hyperglycemia 06/21/2013  . Hyperlipidemia, mixed 10/16/2015  . Hypertension   . Left hip pain 07/10/2015  . Lesion of breast 12/03/2016   benign; resolved  . Low back pain 05/29/2015  . Measles as a child  . Mumps as a child  . Neck pain on left side 06/21/2013  . Osteopenia 01/03/2017  . Overweight (BMI 25.0-29.9) 10/07/2008   Qualifier: Diagnosis of  By: [name] MD, [name]    . Palpitations   . Prediabetes   . RUQ pain 05/29/2015  . Spinal stenosis   . Umbilical hernia   . Urinary frequency 09/28/2011  . Vertigo    Past Surgical History:  Procedure Laterality Date  . BREAST BIOPSY Right 1980   BREAST EXCISIONAL BIOPSY  . BREAST BIOPSY Left 1970   BREAST EXCISIONAL BIOPSY  . BREAST CYST ASPIRATION    . CYSTECTOMY     abdomen  . EYE SURGERY Bilateral    cataract  . INSERTION OF MESH N/A 06/11/2017   Procedure: INSERTION OF MESH;  Surgeon: Rolm Bookbinder, MD;  Location: [place];  Service: General;  Laterality: N/A;  BILATERAL TAP BLOCK  . LAPAROSCOPIC ASSISTED VENTRAL HERNIA REPAIR  06/11/2017   w/mesh  . TUBAL LIGATION  1970  . VENTRAL HERNIA REPAIR N/A 06/11/2017   Procedure: LAPAROSCOPIC VENTRAL HERNIA REPAIR WITH MESH ERAS PATHWAY;  Surgeon: Rolm Bookbinder, MD;  Location: [place];  Service: General;  Laterality: N/A;  BILATERAL TAP BLOCK   Family History  Problem Relation Age of Onset  . Hypertension Mother   . Anxiety disorder Mother   . Ovarian cancer Mother 43        lung- smoker, ovarian  . Lung cancer Mother        smoker  . Arthritis Mother   . Hyperlipidemia Mother   . Alcohol abuse Father   . Diabetes Sister   . Hypertension Sister   . Ovarian cancer Sister   . Arthritis Sister   . Depression Sister   . Hyperlipidemia Sister   . Other Maternal Grandmother        pacemaker  . Multiple sclerosis Maternal Grandfather        ?  [name] Arthritis Brother   . COPD Brother   . Heart attack Brother   . Hyperlipidemia Brother   . Stroke Brother   . Asthma Sister   . Depression Sister   . Hyperlipidemia Sister   . Asthma Sister   . Depression Sister   . Asthma Paternal Uncle    Allergies as of 04/21/2019      Reactions   Nutrasweet Aspartame [aspartame] Diarrhea   Hydrochlorothiazide Cough   Lisinopril Cough      Medication List       Accurate as of April 21, 2019  9:00 AM. If you have any questions, ask your nurse or doctor.        Dialyvite Vitamin D 5000 125 MCG (5000 UT) capsule Generic drug: Cholecalciferol Take 5,000 Units by mouth daily.   Diclofenac Sodium CR 100 MG 24 hr tablet Take 1 tablet (100 mg total) by mouth daily.   fish oil-omega-3 fatty acids 1000 MG capsule Take 1 g by mouth daily.   losartan 50 MG tablet Commonly known as: COZAAR Take 1 tablet (50 mg total) by mouth daily.   magnesium gluconate 500 MG tablet Commonly known as: MAGONATE Take 500 mg by mouth daily.   PROBIOTIC-10 PO Take 1 capsule by mouth daily.   traMADol 50 MG tablet Commonly known as: ULTRAM Take 1 tablet (50 mg total) by mouth every 8 (eight) hours as needed for severe pain.   triamcinolone cream 0.1 % Commonly known as: KENALOG Apply topically 2 (two) times daily. What changed:   how much to take  when to take this  reasons to take this       All past medical history, surgical history, allergies, family history, immunizations andmedications were updated in the EMR today and reviewed under the history and  medication portions of their EMR.     ROS: Negative, with the exception of above mentioned in HPI   Objective:  BP 139/83 (BP Location: Left Arm,  Patient Position: Sitting, Cuff Size: Normal)   Pulse 76   Temp 97.8 F (36.6 C) (Temporal)   Resp 18   Ht 5\' 5"  (1.651 m)   Wt 178 lb 6 oz (80.9 kg)   SpO2 97%   BMI 29.68 kg/m  Body mass index is 29.68 kg/m. Gen: Afebrile. No acute distress. Nontoxic in appearance, well developed, well nourished.  Neuro: Normal gait. PERLA. EOMi. Alert. Oriented x3  Psych: Normal affect, dress and demeanor. Normal speech. Normal thought content and judgment.  No exam data present No results found. No results found for this or any previous visit (from the past 24 hour(s)).  Assessment/Plan: MIYAH HEINERT is a 78 y.o. female present for OV for  Situational anxiety/Lesion of bladder Patient rather anxious over new diagnosis of bladder lesion/bladder cancer.  Discussed the risks and benefits of benzodiazepine use.  She understands risks.  [place] controlled substance database was reviewed today and is appropriate. -Discussed proper Ativan 0.5 mg twice daily as needed use.#60, 1 refill.  Patient aware this is a controlled substance.  Patient was counseled on addictive properties.  Patient aware only to use if absolutely needed.  If wanting additional refills she will need to follow-up face-to-face with UDS and contract signed.  Patient reports understanding.   Reviewed expectations re: course of current medical issues.  Discussed self-management of symptoms.  Outlined signs and symptoms indicating need for more acute intervention.  Patient verbalized understanding and all questions were answered.  Patient received an After-Visit Summary.   > 25 minutes spent with patient, >50% of time spent face to face     No orders of the defined types were placed in this encounter.    Note is dictated utilizing voice recognition software.  Although note has been proof read prior to signing, occasional typographical errors still can be missed. If any questions arise, please do not hesitate to call for verification.   electronically signed by:  Howard Pouch, DO  [place]

## 2019-04-23 ENCOUNTER — Other Ambulatory Visit (HOSPITAL_COMMUNITY)
Admission: RE | Admit: 2019-04-23 | Discharge: 2019-04-23 | Disposition: A | Discharge status: Home / Self Care | Payer: Medicare Other | Source: Ambulatory Visit | Attending: Urology | Admitting: Urology

## 2019-04-23 DIAGNOSIS — Z20828 Contact with and (suspected) exposure to other viral communicable diseases: Secondary | ICD-10-CM | POA: Diagnosis not present

## 2019-04-23 DIAGNOSIS — Z01812 Encounter for preprocedural laboratory examination: Secondary | ICD-10-CM | POA: Diagnosis present

## 2019-04-23 LAB — SARS CORONAVIRUS 2 (TAT 6-24 HRS): SARS Coronavirus 2: NEGATIVE

## 2019-04-24 ENCOUNTER — Encounter (HOSPITAL_BASED_OUTPATIENT_CLINIC_OR_DEPARTMENT_OTHER): Payer: Self-pay

## 2019-04-24 ENCOUNTER — Other Ambulatory Visit: Payer: Self-pay

## 2019-04-24 NOTE — Progress Notes (Signed)
SPOKE W/  Mardene Celeste     SCREENING SYMPTOMS OF COVID 19:   COUGH--NO  RUNNY NOSE--- NO  SORE THROAT---NO  NASAL CONGESTION----NO  SNEEZING----NO  SHORTNESS OF BREATH---NO  DIFFICULTY BREATHING---NO  TEMP >100.0 -----NO  UNEXPLAINED BODY ACHES------NO  CHILLS -------- NO  HEADACHES ---------NO  LOSS OF SMELL/ TASTE --------NO    HAVE YOU OR ANY FAMILY MEMBER TRAVELLED PAST 14 DAYS OUT OF THE   COUNTY---Travelled to [facility] STATE----NO COUNTRY----NO  HAVE YOU OR ANY FAMILY MEMBER BEEN EXPOSED TO ANYONE WITH COVID 19? NO

## 2019-04-24 NOTE — Progress Notes (Signed)
Spoke with: Christina [name] NPO:  After Midnight, no gum, candy, or mints   Arrival time: 1015 AM Labs: Istat4, EKG AM medications: Lorazepam if needed Pre op orders: Yes Ride home: Lanayah Wilkerson (daughter in law) [phone]

## 2019-04-27 ENCOUNTER — Ambulatory Visit (HOSPITAL_BASED_OUTPATIENT_CLINIC_OR_DEPARTMENT_OTHER): Payer: Medicare Other | Admitting: Anesthesiology

## 2019-04-27 ENCOUNTER — Encounter (HOSPITAL_BASED_OUTPATIENT_CLINIC_OR_DEPARTMENT_OTHER): Payer: Self-pay | Admitting: Emergency Medicine

## 2019-04-27 ENCOUNTER — Other Ambulatory Visit: Payer: Self-pay

## 2019-04-27 ENCOUNTER — Encounter (HOSPITAL_BASED_OUTPATIENT_CLINIC_OR_DEPARTMENT_OTHER)
Admission: RE | Disposition: A | Discharge status: Home / Self Care | Payer: Self-pay | Source: Home / Self Care | Attending: Urology

## 2019-04-27 ENCOUNTER — Ambulatory Visit (HOSPITAL_BASED_OUTPATIENT_CLINIC_OR_DEPARTMENT_OTHER)
Admission: RE | Admit: 2019-04-27 | Discharge: 2019-04-27 | Disposition: A | Discharge status: Home / Self Care | Payer: Medicare Other | Attending: Urology | Admitting: Urology

## 2019-04-27 DIAGNOSIS — Z20828 Contact with and (suspected) exposure to other viral communicable diseases: Secondary | ICD-10-CM | POA: Insufficient documentation

## 2019-04-27 DIAGNOSIS — K5790 Diverticulosis of intestine, part unspecified, without perforation or abscess without bleeding: Secondary | ICD-10-CM | POA: Diagnosis not present

## 2019-04-27 DIAGNOSIS — Z87891 Personal history of nicotine dependence: Secondary | ICD-10-CM | POA: Insufficient documentation

## 2019-04-27 DIAGNOSIS — M8580 Other specified disorders of bone density and structure, unspecified site: Secondary | ICD-10-CM | POA: Diagnosis not present

## 2019-04-27 DIAGNOSIS — M1990 Unspecified osteoarthritis, unspecified site: Secondary | ICD-10-CM | POA: Diagnosis not present

## 2019-04-27 DIAGNOSIS — I10 Essential (primary) hypertension: Secondary | ICD-10-CM | POA: Insufficient documentation

## 2019-04-27 DIAGNOSIS — K219 Gastro-esophageal reflux disease without esophagitis: Secondary | ICD-10-CM | POA: Diagnosis not present

## 2019-04-27 DIAGNOSIS — C679 Malignant neoplasm of bladder, unspecified: Secondary | ICD-10-CM | POA: Diagnosis not present

## 2019-04-27 DIAGNOSIS — Z79899 Other long term (current) drug therapy: Secondary | ICD-10-CM | POA: Insufficient documentation

## 2019-04-27 DIAGNOSIS — L409 Psoriasis, unspecified: Secondary | ICD-10-CM | POA: Diagnosis not present

## 2019-04-27 DIAGNOSIS — F418 Other specified anxiety disorders: Secondary | ICD-10-CM | POA: Insufficient documentation

## 2019-04-27 DIAGNOSIS — J45909 Unspecified asthma, uncomplicated: Secondary | ICD-10-CM | POA: Diagnosis not present

## 2019-04-27 HISTORY — DX: Diverticulosis of intestine, part unspecified, without perforation or abscess without bleeding: K57.90

## 2019-04-27 HISTORY — PX: CYSTOSCOPY W/ RETROGRADES: SHX1426

## 2019-04-27 HISTORY — DX: Nevus, non-neoplastic: I78.1

## 2019-04-27 HISTORY — DX: Prediabetes: R73.03

## 2019-04-27 HISTORY — DX: Presence of dental prosthetic device (complete) (partial): Z97.2

## 2019-04-27 HISTORY — DX: Psoriasis, unspecified: L40.9

## 2019-04-27 HISTORY — PX: TRANSURETHRAL RESECTION OF BLADDER TUMOR: SHX2575

## 2019-04-27 HISTORY — DX: Neoplasm of unspecified behavior of bladder: D49.4

## 2019-04-27 LAB — POCT I-STAT, CHEM 8
BUN: 16 mg/dL (ref 8–23)
Calcium, Ion: 1.41 mmol/L — ABNORMAL HIGH (ref 1.15–1.40)
Chloride: 107 mmol/L (ref 98–111)
Creatinine, Ser: 0.7 mg/dL (ref 0.44–1.00)
Glucose, Bld: 114 mg/dL — ABNORMAL HIGH (ref 70–99)
HCT: 41 % (ref 36.0–46.0)
Hemoglobin: 13.9 g/dL (ref 12.0–15.0)
Potassium: 4 mmol/L (ref 3.5–5.1)
Sodium: 142 mmol/L (ref 135–145)
TCO2: 23 mmol/L (ref 22–32)

## 2019-04-27 SURGERY — TURBT (TRANSURETHRAL RESECTION OF BLADDER TUMOR)
Anesthesia: General | Site: Urethra

## 2019-04-27 MED ORDER — ONDANSETRON HCL 4 MG/2ML IJ SOLN
INTRAMUSCULAR | Status: DC | PRN
  Administered 2019-04-27: 4 mg via INTRAVENOUS

## 2019-04-27 MED ORDER — LIDOCAINE 2% (20 MG/ML) 5 ML SYRINGE
INTRAMUSCULAR | Status: AC
  Filled 2019-04-27: qty 5

## 2019-04-27 MED ORDER — HYDROCODONE-ACETAMINOPHEN 5-325 MG PO TABS: 1.0000 | ORAL_TABLET | ORAL | 0 refills | Status: DC | PRN

## 2019-04-27 MED ORDER — LACTATED RINGERS IV SOLN
INTRAVENOUS | Status: DC
  Administered 2019-04-27: 12:00:00 via INTRAVENOUS
  Filled 2019-04-27: qty 1000

## 2019-04-27 MED ORDER — SODIUM CHLORIDE 0.9 % IV SOLN
INTRAVENOUS | Status: DC | PRN
  Administered 2019-04-27: 12:00:00 7 mL

## 2019-04-27 MED ORDER — KETOROLAC TROMETHAMINE 30 MG/ML IJ SOLN
15.0000 mg | Freq: Once | INTRAMUSCULAR | Status: DC | PRN
  Filled 2019-04-27: qty 1

## 2019-04-27 MED ORDER — ROCURONIUM BROMIDE 10 MG/ML (PF) SYRINGE
PREFILLED_SYRINGE | INTRAVENOUS | Status: AC
  Filled 2019-04-27: qty 10

## 2019-04-27 MED ORDER — ALBUTEROL SULFATE HFA 108 (90 BASE) MCG/ACT IN AERS
INHALATION_SPRAY | RESPIRATORY_TRACT | Status: AC
  Filled 2019-04-27: qty 6.7

## 2019-04-27 MED ORDER — PROMETHAZINE HCL 25 MG/ML IJ SOLN
6.2500 mg | INTRAMUSCULAR | Status: DC | PRN
  Filled 2019-04-27: qty 1

## 2019-04-27 MED ORDER — PHENYLEPHRINE 40 MCG/ML (10ML) SYRINGE FOR IV PUSH (FOR BLOOD PRESSURE SUPPORT)
PREFILLED_SYRINGE | INTRAVENOUS | Status: DC | PRN
  Administered 2019-04-27: 80 ug via INTRAVENOUS

## 2019-04-27 MED ORDER — PROPOFOL 10 MG/ML IV BOLUS
INTRAVENOUS | Status: DC | PRN
  Administered 2019-04-27: 120 mg via INTRAVENOUS

## 2019-04-27 MED ORDER — FENTANYL CITRATE (PF) 100 MCG/2ML IJ SOLN
25.0000 ug | INTRAMUSCULAR | Status: DC | PRN
  Administered 2019-04-27: 25 ug via INTRAVENOUS
  Administered 2019-04-27: 50 ug via INTRAVENOUS
  Filled 2019-04-27: qty 1

## 2019-04-27 MED ORDER — CEFAZOLIN SODIUM-DEXTROSE 2-4 GM/100ML-% IV SOLN
2.0000 g | INTRAVENOUS | Status: AC
  Administered 2019-04-27: 2 g via INTRAVENOUS
  Filled 2019-04-27: qty 100

## 2019-04-27 MED ORDER — FENTANYL CITRATE (PF) 100 MCG/2ML IJ SOLN
INTRAMUSCULAR | Status: AC
  Filled 2019-04-27: qty 2

## 2019-04-27 MED ORDER — DEXAMETHASONE SODIUM PHOSPHATE 10 MG/ML IJ SOLN
INTRAMUSCULAR | Status: AC
  Filled 2019-04-27: qty 1

## 2019-04-27 MED ORDER — PHENYLEPHRINE 40 MCG/ML (10ML) SYRINGE FOR IV PUSH (FOR BLOOD PRESSURE SUPPORT)
PREFILLED_SYRINGE | INTRAVENOUS | Status: AC
  Filled 2019-04-27: qty 10

## 2019-04-27 MED ORDER — FENTANYL CITRATE (PF) 100 MCG/2ML IJ SOLN
INTRAMUSCULAR | Status: DC | PRN
  Administered 2019-04-27: 100 ug via INTRAVENOUS

## 2019-04-27 MED ORDER — OXYCODONE HCL 5 MG PO TABS
5.0000 mg | ORAL_TABLET | Freq: Once | ORAL | Status: DC | PRN
  Filled 2019-04-27: qty 1

## 2019-04-27 MED ORDER — IPRATROPIUM-ALBUTEROL 20-100 MCG/ACT IN AERS
INHALATION_SPRAY | RESPIRATORY_TRACT | Status: DC | PRN
  Administered 2019-04-27: 8 via RESPIRATORY_TRACT

## 2019-04-27 MED ORDER — PROPOFOL 10 MG/ML IV BOLUS
INTRAVENOUS | Status: AC
  Filled 2019-04-27: qty 20

## 2019-04-27 MED ORDER — CEFAZOLIN SODIUM-DEXTROSE 2-4 GM/100ML-% IV SOLN
INTRAVENOUS | Status: AC
  Filled 2019-04-27: qty 100

## 2019-04-27 MED ORDER — LIDOCAINE HCL (CARDIAC) PF 100 MG/5ML IV SOSY
PREFILLED_SYRINGE | INTRAVENOUS | Status: DC | PRN
  Administered 2019-04-27: 60 mg via INTRAVENOUS

## 2019-04-27 MED ORDER — SUGAMMADEX SODIUM 200 MG/2ML IV SOLN
INTRAVENOUS | Status: DC | PRN
  Administered 2019-04-27: 160 mg via INTRAVENOUS

## 2019-04-27 MED ORDER — DEXAMETHASONE SODIUM PHOSPHATE 10 MG/ML IJ SOLN
INTRAMUSCULAR | Status: DC | PRN
  Administered 2019-04-27: 5 mg via INTRAVENOUS

## 2019-04-27 MED ORDER — ONDANSETRON HCL 4 MG/2ML IJ SOLN
INTRAMUSCULAR | Status: AC
  Filled 2019-04-27: qty 2

## 2019-04-27 MED ORDER — SODIUM CHLORIDE 0.9 % IR SOLN
Status: DC | PRN
  Administered 2019-04-27: 3000 mL

## 2019-04-27 MED ORDER — ROCURONIUM BROMIDE 10 MG/ML (PF) SYRINGE
PREFILLED_SYRINGE | INTRAVENOUS | Status: DC | PRN
  Administered 2019-04-27: 40 mg via INTRAVENOUS

## 2019-04-27 MED ORDER — OXYCODONE HCL 5 MG/5ML PO SOLN
5.0000 mg | Freq: Once | ORAL | Status: DC | PRN
  Filled 2019-04-27: qty 5

## 2019-04-27 SURGICAL SUPPLY — 30 items
BAG DRAIN URO-CYSTO SKYTR STRL (DRAIN) ×5 IMPLANT
BAG DRN UROCATH (DRAIN) ×2
BAG URINE DRAINAGE (UROLOGICAL SUPPLIES) IMPLANT
BAG URINE LEG 500ML (DRAIN) IMPLANT
CATH FOLEY 2WAY SLVR  5CC 22FR (CATHETERS) ×1
CATH FOLEY 2WAY SLVR 5CC 22FR (CATHETERS) IMPLANT
CATH FOLEY 3WAY 30CC 22F (CATHETERS) ×2 IMPLANT
CATH INTERMIT  6FR 70CM (CATHETERS) ×1 IMPLANT
CLOTH BEACON ORANGE TIMEOUT ST (SAFETY) ×3 IMPLANT
ELECT REM PT RETURN 9FT ADLT (ELECTROSURGICAL)
ELECTRODE REM PT RTRN 9FT ADLT (ELECTROSURGICAL) ×2 IMPLANT
GLOVE BIO SURGEON STRL SZ 6.5 (GLOVE) ×1 IMPLANT
GLOVE BIO SURGEON STRL SZ8 (GLOVE) ×3 IMPLANT
GLOVE BIOGEL PI IND STRL 6.5 (GLOVE) IMPLANT
GLOVE BIOGEL PI INDICATOR 6.5 (GLOVE) ×1
GOWN STRL REUS W/TWL XL LVL3 (GOWN DISPOSABLE) ×3 IMPLANT
GUIDEWIRE STR DUAL SENSOR (WIRE) IMPLANT
GUIDEWIRE ZIPWRE .038 STRAIGHT (WIRE) ×2 IMPLANT
IV NS IRRIG 3000ML ARTHROMATIC (IV SOLUTION) ×4 IMPLANT
KIT TURNOVER CYSTO (KITS) ×3 IMPLANT
LOOP CUT BIPOLAR 24F LRG (ELECTROSURGICAL) ×1 IMPLANT
MANIFOLD NEPTUNE II (INSTRUMENTS) ×3 IMPLANT
NS IRRIG 500ML POUR BTL (IV SOLUTION) ×3 IMPLANT
PACK CYSTO (CUSTOM PROCEDURE TRAY) ×3 IMPLANT
PLUG CATH AND CAP STER (CATHETERS) IMPLANT
SYR 10ML LL (SYRINGE) ×3 IMPLANT
SYR 30ML LL (SYRINGE) ×3 IMPLANT
SYRINGE IRR TOOMEY STRL 70CC (SYRINGE) IMPLANT
TUBE CONNECTING 12X1/4 (SUCTIONS) ×3 IMPLANT
TUBING UROLOGY SET (TUBING) ×3 IMPLANT

## 2019-04-27 NOTE — H&P (Signed)
Urology Admission H&P  Chief Complaint: bladder tumor  History of Present Illness: Christina [name] is a 78yo with a history of gross hematuria who was found to have a positive urine cytology and bladder tumors on office cystoscopy. No recent gross hematuria. No LUTS. No fevers/chills/sweats  Past Medical History:  Diagnosis Date  . Arthritis   . Asthma    a little?, no problems in several years  . Bladder tumor   . Cervical cancer screening 01/31/2012  . Chicken pox as a child  . Degenerative tear of acetabular labrum of left hip 02/26/2019  . Depression with anxiety 06/07/2009   Qualifier: Diagnosis of  By: [name] MD, [name]    . Dermatitis of external ear 07/11/2012  . Diverticulosis   . Dysphagia 01/31/2012  . Hiatal hernia with gastroesophageal reflux 10/26/2010   Qualifier: Diagnosis of  By: [name] MD, [name]    . Hyperglycemia 06/21/2013  . Hyperlipidemia, mixed 10/16/2015   pt unaware  . Hypertension   . Left hip pain 07/10/2015  . Lesion of breast 12/03/2016   benign; resolved  . Low back pain 05/29/2015  . Measles as a child  . Mumps as a child  . Neck pain on left side 06/21/2013  . Osteopenia 01/03/2017  . Overweight (BMI 25.0-29.9) 10/07/2008   Qualifier: Diagnosis of  By: [name] MD, [name]    . Palpitations    several years ago, not currently  . Pre-diabetes   . Psoriasis    ears  . RUQ pain 05/29/2015  . Spider veins    Bilateral legs  . Spinal stenosis   . Umbilical hernia   . Urinary frequency 09/28/2011  . Vertigo   . Wears dentures    Upper,   Past Surgical History:  Procedure Laterality Date  . ABDOMINAL SURGERY  1970's  . BREAST BIOPSY Right 1980   BREAST EXCISIONAL BIOPSY  . BREAST BIOPSY Left 1970   BREAST EXCISIONAL BIOPSY  . BREAST CYST ASPIRATION    . COLONOSCOPY    . CYSTECTOMY     abdomen  . EYE SURGERY Bilateral    cataract  . INSERTION OF MESH N/A 06/11/2017   Procedure: INSERTION OF MESH;  Surgeon: Rolm Bookbinder, MD;   Location: [place];  Service: General;  Laterality: N/A;  BILATERAL TAP BLOCK  . TUBAL LIGATION  1970  . VENTRAL HERNIA REPAIR N/A 06/11/2017   Procedure: LAPAROSCOPIC VENTRAL HERNIA REPAIR WITH MESH ERAS PATHWAY;  Surgeon: Rolm Bookbinder, MD;  Location: [place];  Service: General;  Laterality: N/A;  BILATERAL TAP BLOCK    Home Medications:  Current Facility-Administered Medications  Medication Dose Route Frequency Provider Last Rate Last Dose  . ceFAZolin (ANCEF) IVPB 2g/100 mL premix  2 g Intravenous 30 min Pre-Op McKenzie, Candee Furbish, MD      . lactated ringers infusion   Intravenous Continuous Lillia Abed, MD       Allergies:  Allergies  Allergen Reactions  . Nutrasweet Aspartame [Aspartame] Diarrhea  . Lisinopril Cough    Family History  Problem Relation Age of Onset  . Hypertension Mother   . Anxiety disorder Mother   . Ovarian cancer Mother 38       lung- smoker, ovarian  . Lung cancer Mother        smoker  . Arthritis Mother   . Hyperlipidemia Mother   . Alcohol abuse Father   . Diabetes Sister   . Hypertension Sister   . Ovarian cancer Sister   . Arthritis  Sister   . Depression Sister   . Hyperlipidemia Sister   . Other Maternal Grandmother        pacemaker  . Multiple sclerosis Maternal Grandfather        ?  [name] Arthritis Brother   . COPD Brother   . Heart attack Brother   . Hyperlipidemia Brother   . Stroke Brother   . Asthma Sister   . Depression Sister   . Hyperlipidemia Sister   . Asthma Sister   . Depression Sister   . Asthma Paternal Uncle    Social History:  reports that she quit smoking about 28 years ago. Her smoking use included cigarettes. She has a 30.00 pack-year smoking history. She has never used smokeless tobacco. She reports that she does not drink alcohol or use drugs.  Review of Systems  Genitourinary: Positive for hematuria.  All other systems reviewed and are negative.   Physical Exam:  Vital signs in last 24 hours: Temp:  [98.3  F (36.8 C)] 98.3 F (36.8 C) (08/31 1002) Pulse Rate:  [84] 84 (08/31 1002) Resp:  [16] 16 (08/31 1002) BP: (153)/(81) 153/81 (08/31 1002) SpO2:  [98 %] 98 % (08/31 1002) Weight:  [79.7 kg] 79.7 kg (08/31 1002) Physical Exam  Constitutional: She is oriented to person, place, and time. She appears well-developed and well-nourished.  HENT:  Head: Normocephalic and atraumatic.  Eyes: Pupils are equal, round, and reactive to light. EOM are normal.  Neck: Normal range of motion. No thyromegaly present.  Cardiovascular: Normal rate and regular rhythm.  Respiratory: Effort normal. No respiratory distress.  GI: Soft. She exhibits no distension.  Musculoskeletal: Normal range of motion.        General: No edema.  Neurological: She is alert and oriented to person, place, and time.  Skin: Skin is warm and dry.  Psychiatric: She has a normal mood and affect. Her behavior is normal. Judgment and thought content normal.    Laboratory Data:  Results for orders placed or performed during the hospital encounter of 04/27/19 (from the past 24 hour(s))  I-STAT, chem 8     Status: Abnormal   Collection Time: 04/27/19 11:08 AM  Result Value Ref Range   Sodium 142 135 - 145 mmol/L   Potassium 4.0 3.5 - 5.1 mmol/L   Chloride 107 98 - 111 mmol/L   BUN 16 8 - 23 mg/dL   Creatinine, Ser 0.70 0.44 - 1.00 mg/dL   Glucose, Bld 114 (H) 70 - 99 mg/dL   Calcium, Ion 1.41 (H) 1.15 - 1.40 mmol/L   TCO2 23 22 - 32 mmol/L   Hemoglobin 13.9 12.0 - 15.0 g/dL   HCT 41.0 36.0 - 46.0 %   Recent Results (from the past 240 hour(s))  SARS CORONAVIRUS 2 (TAT 6-12 HRS) Nasal Swab Aptima Multi Swab     Status: None   Collection Time: 04/23/19  2:48 PM   Specimen: Aptima Multi Swab; Nasal Swab  Result Value Ref Range Status   SARS Coronavirus 2 NEGATIVE NEGATIVE Final    Comment: (NOTE) SARS-CoV-2 target nucleic acids are NOT DETECTED. The SARS-CoV-2 RNA is generally detectable in upper and lower respiratory  specimens during the acute phase of infection. Negative results do not preclude SARS-CoV-2 infection, do not rule out co-infections with other pathogens, and should not be used as the sole basis for treatment or other patient management decisions. Negative results must be combined with clinical observations, patient history, and epidemiological information. The expected result is  Negative. Fact Sheet for Patients: SugarRoll.be Fact Sheet for Healthcare Providers: https://www.woods-mathews.com/ This test is not yet approved or cleared by the [place] FDA and  has been authorized for detection and/or diagnosis of SARS-CoV-2 by FDA under an Emergency Use Authorization (EUA). This EUA will remain  in effect (meaning this test can be used) for the duration of the COVID-19 declaration under Section 56 4(b)(1) of the Act, 21 U.S.C. section 360bbb-3(b)(1), unless the authorization is terminated or revoked sooner. Performed at [facility], [place] [address], [place], [place] [address]    Creatinine: Recent Labs    04/27/19 1108  CREATININE 0.70   Baseline Creatinine: 0.7  Impression/Assessment:  78yo with bladder tumors  Plan:  The risks/benefits/alternatives to TURBT was explained to the patient and she understands and wishes to proceed with surgery  Nicolette Bang 04/27/2019, 11:37 AM

## 2019-04-27 NOTE — Anesthesia Procedure Notes (Signed)
Procedure Name: Intubation Date/Time: 04/27/2019 12:06 PM Performed by: Raenette Rover, CRNA Pre-anesthesia Checklist: Patient identified, Emergency Drugs available, Suction available and Patient being monitored Patient Re-evaluated:Patient Re-evaluated prior to induction Oxygen Delivery Method: Circle system utilized Preoxygenation: Pre-oxygenation with 100% oxygen Induction Type: IV induction Ventilation: Mask ventilation without difficulty Laryngoscope Size: Mac and 3 Grade View: Grade I Tube type: Oral Tube size: 7.0 mm Number of attempts: 1 Airway Equipment and Method: Stylet Placement Confirmation: ETT inserted through vocal cords under direct vision,  positive ETCO2,  CO2 detector and breath sounds checked- equal and bilateral Secured at: 21 cm Tube secured with: Tape Dental Injury: Teeth and Oropharynx as per pre-operative assessment

## 2019-04-27 NOTE — Transfer of Care (Signed)
Immediate Anesthesia Transfer of Care Note  Patient: ALYCIANA SEWALL  Procedure(s) Performed: TRANSURETHRAL RESECTION OF BLADDER TUMOR (TURBT) (N/A Bladder) CYSTOSCOPY WITH RETROGRADE PYELOGRAM (Bilateral Urethra)  Patient Location: PACU  Anesthesia Type:General  Level of Consciousness: awake, alert , oriented and patient cooperative  Airway & Oxygen Therapy: Patient Spontanous Breathing and Patient connected to nasal cannula oxygen  Post-op Assessment: Report given to RN and Post -op Vital signs reviewed and stable  Post vital signs: Reviewed and stable  Last Vitals:  Vitals Value Taken Time  BP 155/74 04/27/19 1245  Temp    Pulse 79 04/27/19 1247  Resp 17 04/27/19 1247  SpO2 99 % 04/27/19 1247  Vitals shown include unvalidated device data.  Last Pain:  Vitals:   04/27/19 1125  TempSrc:   PainSc: 3       Patients Stated Pain Goal: 5 ([phone] [phone])  Complications: No apparent anesthesia complications

## 2019-04-27 NOTE — Discharge Instructions (Signed)
Indwelling Urinary Catheter Care, Adult °An indwelling urinary catheter is a thin tube that is put into your bladder. The tube helps to drain pee (urine) out of your body. The tube goes in through your urethra. Your urethra is where pee comes out of your body. Your pee will come out through the catheter, then it will go into a bag (drainage bag). °Take good care of your catheter so it will work well. °How to wear your catheter and bag °Supplies needed °· Sticky tape (adhesive tape) or a leg strap. °· Alcohol wipe or soap and water (if you use tape). °· A clean towel (if you use tape). °· Large overnight bag. °· Smaller bag (leg bag). °Wearing your catheter °Attach your catheter to your leg with tape or a leg strap. °· Make sure the catheter is not pulled tight. °· If a leg strap gets wet, take it off and put on a dry strap. °· If you use tape to hold the bag on your leg: °1. Use an alcohol wipe or soap and water to wash your skin where the tape made it sticky before. °2. Use a clean towel to pat-dry that skin. °3. Use new tape to make the bag stay on your leg. °Wearing your bags °You should have been given a large overnight bag. °· You may wear the overnight bag in the day or night. °· Always have the overnight bag lower than your bladder.  Do not let the bag touch the floor. °· Before you go to sleep, put a clean plastic bag in a wastebasket. Then hang the overnight bag inside the wastebasket. °You should also have a smaller leg bag that fits under your clothes. °· Always wear the leg bag below your knee. °· Do not wear your leg bag at night. °How to care for your skin and catheter °Supplies needed °· A clean washcloth. °· Water and mild soap. °· A clean towel. °Caring for your skin and catheter ° °  ° °· Clean the skin around your catheter every day: °? Wash your hands with soap and water. °? Wet a clean washcloth in warm water and mild soap. °? Clean the skin around your urethra. °? If you are female: °? Gently  spread the folds of skin around your vagina (labia). °? With the washcloth in your other hand, wipe the inner side of your labia on each side. Wipe from front to back. °? If you are female: °? Pull back any skin that covers the end of your penis (foreskin). °? With the washcloth in your other hand, wipe your penis in small circles. Start wiping at the tip of your penis, then move away from the catheter. °? Move the foreskin back in place, if needed. °? With your free hand, hold the catheter close to where it goes into your body. °? Keep holding the catheter during cleaning so it does not get pulled out. °? With the washcloth in your other hand, clean the catheter. °? Only wipe downward on the catheter. °? Do not wipe upward toward your body. Doing this may push germs into your urethra and cause infection. °? Use a clean towel to pat-dry the catheter and the skin around it. Make sure to wipe off all soap. °? Wash your hands with soap and water. °· Shower every day. Do not take baths. °· Do not use cream, ointment, or lotion on the area where the catheter goes into your body, unless your doctor tells you   to. °· Do not use powders, sprays, or lotions on your genital area. °· Check your skin around the catheter every day for signs of infection. Check for: °? Redness, swelling, or pain. °? Fluid or blood. °? Warmth. °? Pus or a bad smell. °How to empty the bag °Supplies needed °· Rubbing alcohol. °· Gauze pad or cotton ball. °· Tape or a leg strap. °Emptying the bag °Pour the pee out of your bag when it is ?-½ full, or at least 2-3 times a day. Do this for your overnight bag and your leg bag. °1. Wash your hands with soap and water. °2. Separate (detach) the bag from your leg. °3. Hold the bag over the toilet or a clean pail. Keep the bag lower than your hips and bladder. This is so the pee (urine) does not go back into the tube. °4. Open the pour spout. It is at the bottom of the bag. °5. Empty the pee into the toilet or  pail. Do not let the pour spout touch any surface. °6. Put rubbing alcohol on a gauze pad or cotton ball. °7. Use the gauze pad or cotton ball to clean the pour spout. °8. Close the pour spout. °9. Attach the bag to your leg with tape or a leg strap. °10. Wash your hands with soap and water. °Follow instructions for cleaning the drainage bag: °· From the product maker. °· As told by your doctor. °How to change the bag °Supplies needed °· Alcohol wipes. °· A clean bag. °· Tape or a leg strap. °Changing the bag °Replace your bag when it starts to leak, smell bad, or look dirty. °1. Wash your hands with soap and water. °2. Separate the dirty bag from your leg. °3. Pinch the catheter with your fingers so that pee does not spill out. °4. Separate the catheter tube from the bag tube where these tubes connect (at the connection valve). Do not let the tubes touch any surface. °5. Clean the end of the catheter tube with an alcohol wipe. Use a different alcohol wipe to clean the end of the bag tube. °6. Connect the catheter tube to the tube of the clean bag. °7. Attach the clean bag to your leg with tape or a leg strap. Do not make the bag tight on your leg. °8. Wash your hands with soap and water. °General rules ° °· Never pull on your catheter. Never try to take it out. Doing that can hurt you. °· Always wash your hands before and after you touch your catheter or bag. Use a mild, fragrance-free soap. If you do not have soap and water, use hand sanitizer. °· Always make sure there are no twists or bends (kinks) in the catheter tube. °· Always make sure there are no leaks in the catheter or bag. °· Drink enough fluid to keep your pee pale yellow. °· Do not take baths, swim, or use a hot tub. °· If you are female, wipe from front to back after you poop (have a bowel movement). °Contact a doctor if: °· Your pee is cloudy. °· Your pee smells worse than usual. °· Your catheter gets clogged. °· Your catheter leaks. °· Your bladder  feels full. °Get help right away if: °· You have redness, swelling, or pain where the catheter goes into your body. °· You have fluid, blood, pus, or a bad smell coming from the area where the catheter goes into your body. °· Your skin feels warm where   the catheter goes into your body. °· You have a fever. °· You have pain in your: °? Belly (abdomen). °? Legs. °? Lower back. °? Bladder. °· You see blood in the catheter. °· Your pee is pink or red. °· You feel sick to your stomach (nauseous). °· You throw up (vomit). °· You have chills. °· Your pee is not draining into the bag. °· Your catheter gets pulled out. °Summary °· An indwelling urinary catheter is a thin tube that is placed into the bladder to help drain pee (urine) out of the body. °· The catheter is placed into the part of the body that drains pee from the bladder (urethra). °· Taking good care of your catheter will keep it working properly and help prevent problems. °· Always wash your hands before and after touching your catheter or bag. °· Never pull on your catheter or try to take it out. °This information is not intended to replace advice given to you by your health care provider. Make sure you discuss any questions you have with your health care provider. °Document Released: 12/08/2012 Document Revised: 12/05/2018 Document Reviewed: 03/29/2017 °[employer] Patient Education © 2020 [employer] ° ° °Post Anesthesia Home Care Instructions ° °Activity: °Get plenty of rest for the remainder of the day. A responsible individual must stay with you for 24 hours following the procedure.  °For the next 24 hours, DO NOT: °-Drive a car °-Operate machinery °-Drink alcoholic beverages °-Take any medication unless instructed by your physician °-Make any legal decisions or sign important papers. ° °Meals: °Start with liquid foods such as gelatin or soup. Progress to regular foods as tolerated. Avoid greasy, spicy, heavy foods. If nausea and/or vomiting occur, drink  only clear liquids until the nausea and/or vomiting subsides. Call your physician if vomiting continues. ° °Special Instructions/Symptoms: °Your throat may feel dry or sore from the anesthesia or the breathing tube placed in your throat during surgery. If this causes discomfort, gargle with warm salt water. The discomfort should disappear within 24 hours. ° °If you had a scopolamine patch placed behind your ear for the management of post- operative nausea and/or vomiting: ° °1. The medication in the patch is effective for 72 hours, after which it should be removed.  Wrap patch in a tissue and discard in the trash. Wash hands thoroughly with soap and water. °2. You may remove the patch earlier than 72 hours if you experience unpleasant side effects which may include dry mouth, dizziness or visual disturbances. °3. Avoid touching the patch. Wash your hands with soap and water after contact with the patch. °

## 2019-04-27 NOTE — Anesthesia Preprocedure Evaluation (Signed)
Anesthesia Evaluation  Patient identified by MRN, date of birth, ID band Patient awake    Reviewed: Allergy & Precautions, NPO status , Patient's Chart, lab work & pertinent test results  Airway Mallampati: II  TM Distance: >3 FB Neck ROM: Full    Dental  (+) Upper Dentures   Pulmonary neg pulmonary ROS, former smoker,    Pulmonary exam normal breath sounds clear to auscultation       Cardiovascular hypertension, Normal cardiovascular exam Rhythm:Regular Rate:Normal     Neuro/Psych negative neurological ROS  negative psych ROS   GI/Hepatic negative GI ROS, Neg liver ROS,   Endo/Other  negative endocrine ROS  Renal/GU negative Renal ROS  negative genitourinary   Musculoskeletal negative musculoskeletal ROS (+)   Abdominal   Peds negative pediatric ROS (+)  Hematology negative hematology ROS (+)   Anesthesia Other Findings   Reproductive/Obstetrics negative OB ROS                             Anesthesia Physical Anesthesia Plan  ASA: II  Anesthesia Plan: General   Post-op Pain Management:    Induction: Intravenous  PONV Risk Score and Plan: 3 and Ondansetron, Dexamethasone and Treatment may vary due to age or medical condition  Airway Management Planned: LMA  Additional Equipment:   Intra-op Plan:   Post-operative Plan: Extubation in OR  Informed Consent: I have reviewed the patients History and Physical, chart, labs and discussed the procedure including the risks, benefits and alternatives for the proposed anesthesia with the patient or authorized representative who has indicated his/her understanding and acceptance.     Dental advisory given  Plan Discussed with: CRNA and Surgeon  Anesthesia Plan Comments:         Anesthesia Quick Evaluation

## 2019-04-27 NOTE — Anesthesia Postprocedure Evaluation (Signed)
Anesthesia Post Note  Patient: ASHLEYANN FLAHERTY  Procedure(s) Performed: TRANSURETHRAL RESECTION OF BLADDER TUMOR (TURBT) (N/A Bladder) CYSTOSCOPY WITH RETROGRADE PYELOGRAM (Bilateral Urethra)     Patient location during evaluation: PACU Anesthesia Type: General Level of consciousness: awake and alert Pain management: pain level controlled Vital Signs Assessment: post-procedure vital signs reviewed and stable Respiratory status: spontaneous breathing, nonlabored ventilation, respiratory function stable and patient connected to nasal cannula oxygen Cardiovascular status: blood pressure returned to baseline and stable Postop Assessment: no apparent nausea or vomiting Anesthetic complications: no    Last Vitals:  Vitals:   04/27/19 1315 04/27/19 1330  BP: (!) 145/70 (!) 159/74  Pulse: 75 74  Resp: 16 (!) 23  Temp:    SpO2: 97% 97%    Last Pain:  Vitals:   04/27/19 1323  TempSrc:   PainSc: 6                  ROSE,GEORGE S

## 2019-04-27 NOTE — Op Note (Signed)
.  Preoperative diagnosis: bladder tumor  Postoperative diagnosis: Same  Procedure: 1 cystoscopy 2. bilateral retrograde pyelography 3.  Intraoperative fluoroscopy, under one hour, with interpretation 4.  Transurethral resection of bladder tumor, medium  Attending: Rosie Fate  Anesthesia: General  Estimated blood loss: Minimal  Drains: 22 French foley  Specimens: bladder  Antibiotics: ancef  Findings: multiple areas of irritation from foley catheter. 3cm sessile and papillary right lateral wall tumor involving diverticulum.  Ureteral orifices in normal anatomic location. No hydronephrosis or filling defects in either collecting system  Indications: Patient is a 78 year old female with a history of bladder tumor and gross hematuria.  After discussing treatment options, they decided proceed with transurethral resection of a bladder tumor.  Procedure her in detail: The patient was brought to the operating room and a brief timeout was done to ensure correct patient, correct procedure, correct site.  General anesthesia was administered patient was placed in dorsal lithotomy position.  Their genitalia was then prepped and draped in usual sterile fashion.  A rigid 109 French cystoscope was passed in the urethra and the bladder.  Bladder was inspected and we noted and a 3cm bladder tumor.  the ureteral orifices were in the normal orthotopic locations.  a 6 french ureteral catheter was then instilled into the left ureteral orifice.  a gentle retrograde was obtained and findings noted above. We then turned our attention to the right side. a 6 french ureteral catheter was then instilled into the right ureteral orifice.  a gentle retrograde was obtained and findings noted above. We then removed the cystoscope and placed a resectoscope into the bladder. We proceeded to remove the large clot burden from the bladder. Once this was complete we turned our attention to the bladder tumor. Using the bipolar  resectoscope we removed the bladder tumor down to the base. A subsequent muscle deep biopsy was then taken. Hemostasis was then obtained with electrocautery. We then removed the bladder tumor chips and sent them for pathology. We then re-inspected the bladder and found no residula bleeding.  the bladder was then drained, a 22 French foley was placed and this concluded the procedure which was well tolerated by patient.  Complications: None  Condition: Stable, extubated, transferred to PACU  Plan: Patient is to be discharged home and followup in 5 days for foley catheter removal and pathology discussion.

## 2019-04-28 ENCOUNTER — Encounter (HOSPITAL_BASED_OUTPATIENT_CLINIC_OR_DEPARTMENT_OTHER): Payer: Self-pay | Admitting: Urology

## 2019-05-12 ENCOUNTER — Other Ambulatory Visit: Payer: Self-pay | Admitting: Urology

## 2019-05-14 ENCOUNTER — Encounter: Payer: Self-pay | Admitting: *Deleted

## 2019-05-14 NOTE — Progress Notes (Signed)
Reached out to Christina [name] to introduce myself as the office RN Navigator and explain our new patient process. Reviewed the reason for their referral and scheduled their new patient appointment along with labs. Provided address and directions to the office including call back phone number. Reviewed with patient any concerns they may have or any possible barriers to attending their appointment.   Informed patient about my role as a navigator. I am out of the office when the patient is scheduled however one of the RNs will provide her with the new patient packet and my contact information. I will follow up with them later in the week to assess what needs may have developed after her initial meeting with Dr Marin Olp. At this time patient has no further questions or needs.

## 2019-05-18 ENCOUNTER — Inpatient Hospital Stay: Payer: Medicare Other | Attending: Hematology & Oncology

## 2019-05-18 ENCOUNTER — Other Ambulatory Visit: Payer: Self-pay

## 2019-05-18 ENCOUNTER — Inpatient Hospital Stay (HOSPITAL_BASED_OUTPATIENT_CLINIC_OR_DEPARTMENT_OTHER): Payer: Medicare Other | Admitting: Hematology & Oncology

## 2019-05-18 VITALS — BP 166/74 | HR 82 | Temp 97.1°F | Resp 18 | Wt 173.0 lb

## 2019-05-18 DIAGNOSIS — E785 Hyperlipidemia, unspecified: Secondary | ICD-10-CM | POA: Insufficient documentation

## 2019-05-18 DIAGNOSIS — M1990 Unspecified osteoarthritis, unspecified site: Secondary | ICD-10-CM | POA: Diagnosis not present

## 2019-05-18 DIAGNOSIS — Z87891 Personal history of nicotine dependence: Secondary | ICD-10-CM | POA: Diagnosis not present

## 2019-05-18 DIAGNOSIS — Z79899 Other long term (current) drug therapy: Secondary | ICD-10-CM | POA: Insufficient documentation

## 2019-05-18 DIAGNOSIS — F418 Other specified anxiety disorders: Secondary | ICD-10-CM | POA: Insufficient documentation

## 2019-05-18 DIAGNOSIS — I10 Essential (primary) hypertension: Secondary | ICD-10-CM | POA: Insufficient documentation

## 2019-05-18 DIAGNOSIS — N329 Bladder disorder, unspecified: Secondary | ICD-10-CM

## 2019-05-18 DIAGNOSIS — M79652 Pain in left thigh: Secondary | ICD-10-CM | POA: Insufficient documentation

## 2019-05-18 DIAGNOSIS — M8580 Other specified disorders of bone density and structure, unspecified site: Secondary | ICD-10-CM | POA: Insufficient documentation

## 2019-05-18 DIAGNOSIS — Z8041 Family history of malignant neoplasm of ovary: Secondary | ICD-10-CM | POA: Diagnosis not present

## 2019-05-18 DIAGNOSIS — C672 Malignant neoplasm of lateral wall of bladder: Secondary | ICD-10-CM

## 2019-05-18 LAB — CMP (CANCER CENTER ONLY)
ALT: 9 U/L (ref 0–44)
AST: 15 U/L (ref 15–41)
Albumin: 4.1 g/dL (ref 3.5–5.0)
Alkaline Phosphatase: 69 U/L (ref 38–126)
Anion gap: 7 (ref 5–15)
BUN: 12 mg/dL (ref 8–23)
CO2: 27 mmol/L (ref 22–32)
Calcium: 10.5 mg/dL — ABNORMAL HIGH (ref 8.9–10.3)
Chloride: 105 mmol/L (ref 98–111)
Creatinine: 0.83 mg/dL (ref 0.44–1.00)
GFR, Est AFR Am: >60 mL/min (ref >60)
GFR, Estimated: >60 mL/min (ref >60)
Glucose, Bld: 130 mg/dL — ABNORMAL HIGH (ref 70–99)
Potassium: 3.9 mmol/L (ref 3.5–5.1)
Sodium: 139 mmol/L (ref 135–145)
Total Bilirubin: 1.1 mg/dL (ref 0.3–1.2)
Total Protein: 7 g/dL (ref 6.5–8.1)

## 2019-05-18 LAB — CBC WITH DIFFERENTIAL (CANCER CENTER ONLY)
Abs Immature Granulocytes: 0.02 10*3/uL (ref 0.00–0.07)
Basophils Absolute: 0 10*3/uL (ref 0.0–0.1)
Basophils Relative: 0 %
Eosinophils Absolute: 0 10*3/uL (ref 0.0–0.5)
Eosinophils Relative: 0 %
HCT: 41.6 % (ref 36.0–46.0)
Hemoglobin: 13.4 g/dL (ref 12.0–15.0)
Immature Granulocytes: 0 %
Lymphocytes Relative: 30 %
Lymphs Abs: 2.8 10*3/uL (ref 0.7–4.0)
MCH: 31.8 pg (ref 26.0–34.0)
MCHC: 32.2 g/dL (ref 30.0–36.0)
MCV: 98.8 fL (ref 80.0–100.0)
Monocytes Absolute: 0.6 10*3/uL (ref 0.1–1.0)
Monocytes Relative: 7 %
Neutro Abs: 5.8 10*3/uL (ref 1.7–7.7)
Neutrophils Relative %: 63 %
Platelet Count: 292 10*3/uL (ref 150–400)
RBC: 4.21 MIL/uL (ref 3.87–5.11)
RDW: 13.1 % (ref 11.5–15.5)
WBC Count: 9.3 10*3/uL (ref 4.0–10.5)
nRBC: 0 % (ref 0.0–0.2)

## 2019-05-18 NOTE — Progress Notes (Signed)
Referral MD  Reason for Referral: High-grade superficial bladder cancer  Chief Complaint  Patient presents with  . New Patient (Initial Visit)  : I am not sure as to what is going on with my cancer.  HPI: [name] is a very charming 78 year old white female.  She actually goes to my church.  She has been in good health.  She stopped smoking over 30 years ago.  She does not drink.  She has no obvious history of occupational exposures.  She said that on July 4, she began to have painless hematuria.  This would come and go.  There is no fever.  There is no abdominal pain.  She had no weight loss.  There is no obvious change in bowel or bladder habits otherwise.  She ultimately got to Dr. Alyson Ingles of urology.  He went ahead and did a cystoscopy on her on 04/27/2019.  At the time of her cystoscopy, he found that she had a 3 cm sessile and papillary right lateral wall tumor.  The bladder looked normal otherwise.  He went ahead and did biopsies on this tumor.  The pathology report [id number]) showed that this was a high-grade pleomorphic papillary urothelial carcinoma.  There was no invasion into the bladder muscle.  She had had a CT scan done.  I do not have these results but the patient reports that this CT scan did not show any obvious spread.  She was quite worried about the fact that she had bladder cancer.  She wanted to see an oncologist.  As such, she referred herself to the [facility].  She says she is to undergo another and cystoscopy on October 5.  After that, it sounds like she is going to have intravesicular therapy with BCG.  I reassured her that I thought that she was doing very well.  Again, I emphasized to her that this is only superficial bladder cancer and not invasive bladder cancer.  However, given the fact that it is high-grade, it has a higher potential for developing into an invasive cancer.  She is having unusual pains in her left thigh.  She has  had scans done which have not shown anything obvious.  She says she might have a bulging disc at L5-S1.  She has osteoarthritis.  She may have some involvement of the left L5 nerve root.  She has had some epidural injections which may have helped a little bit.  There is been no weight loss.  Her only other surgery has been hernia surgery.  She has no family history of cancer outside of a I think mother had lung cancer.  I think there was some secondhand smoke exposure.  She has had no cough.  Is been no headache.  She has had no nausea or vomiting.  Overall, I would have to say that her performance status is ECOG 1.    Past Medical History:  Diagnosis Date  . Arthritis   . Asthma    a little?, no problems in several years  . Bladder tumor   . Cervical cancer screening 01/31/2012  . Chicken pox as a child  . Degenerative tear of acetabular labrum of left hip 02/26/2019  . Depression with anxiety 06/07/2009   Qualifier: Diagnosis of  By: [name] MD, [name]    . Dermatitis of external ear 07/11/2012  . Diverticulosis   . Dysphagia 01/31/2012  . Hiatal hernia with gastroesophageal reflux 10/26/2010   Qualifier: Diagnosis of  By: [name] MD,  [name]    . Hyperglycemia 06/21/2013  . Hyperlipidemia, mixed 10/16/2015   pt unaware  . Hypertension   . Left hip pain 07/10/2015  . Lesion of breast 12/03/2016   benign; resolved  . Low back pain 05/29/2015  . Measles as a child  . Mumps as a child  . Neck pain on left side 06/21/2013  . Osteopenia 01/03/2017  . Overweight (BMI 25.0-29.9) 10/07/2008   Qualifier: Diagnosis of  By: [name] MD, [name]    . Palpitations    several years ago, not currently  . Pre-diabetes   . Psoriasis    ears  . RUQ pain 05/29/2015  . Spider veins    Bilateral legs  . Spinal stenosis   . Umbilical hernia   . Urinary frequency 09/28/2011  . Vertigo   . Wears dentures    Upper,  :  Past Surgical History:  Procedure Laterality Date  . ABDOMINAL  SURGERY  1970's  . BREAST BIOPSY Right 1980   BREAST EXCISIONAL BIOPSY  . BREAST BIOPSY Left 1970   BREAST EXCISIONAL BIOPSY  . BREAST CYST ASPIRATION    . COLONOSCOPY    . CYSTECTOMY     abdomen  . CYSTOSCOPY W/ RETROGRADES Bilateral 04/27/2019   Procedure: CYSTOSCOPY WITH RETROGRADE PYELOGRAM;  Surgeon: Cleon Gustin, MD;  Location: [facility];  Service: Urology;  Laterality: Bilateral;  . EYE SURGERY Bilateral    cataract  . INSERTION OF MESH N/A 06/11/2017   Procedure: INSERTION OF MESH;  Surgeon: Rolm Bookbinder, MD;  Location: [place];  Service: General;  Laterality: N/A;  BILATERAL TAP BLOCK  . TRANSURETHRAL RESECTION OF BLADDER TUMOR N/A 04/27/2019   Procedure: TRANSURETHRAL RESECTION OF BLADDER TUMOR (TURBT);  Surgeon: Cleon Gustin, MD;  Location: [facility];  Service: Urology;  Laterality: N/A;  1 HR  . TUBAL LIGATION  1970  . VENTRAL HERNIA REPAIR N/A 06/11/2017   Procedure: LAPAROSCOPIC VENTRAL HERNIA REPAIR WITH MESH ERAS PATHWAY;  Surgeon: Rolm Bookbinder, MD;  Location: [place];  Service: General;  Laterality: N/A;  BILATERAL TAP BLOCK  :   Current Outpatient Medications:  [name]  Mirabegron (MYRBETRIQ PO), Take by mouth daily., Disp: , Rfl:  .  LORazepam (ATIVAN) 0.5 MG tablet, Take 1 tablet (0.5 mg total) by mouth 2 (two) times daily as needed for anxiety., Disp: 60 tablet, Rfl: 2 .  losartan (COZAAR) 50 MG tablet, Take 1 tablet (50 mg total) by mouth daily., Disp: 90 tablet, Rfl: 1 .  triamcinolone cream (KENALOG) 0.1 %, Apply topically 2 (two) times daily. (Patient taking differently: Apply 1 application topically 2 (two) times daily as needed (in ears). ), Disp: 80 g, Rfl: 0:  :  Allergies  Allergen Reactions  . Nutrasweet Aspartame [Aspartame] Diarrhea  . Lisinopril Cough  :  Family History  Problem Relation Age of Onset  . Hypertension Mother   . Anxiety disorder Mother   . Ovarian cancer Mother 59       lung-  smoker, ovarian  . Lung cancer Mother        smoker  . Arthritis Mother   . Hyperlipidemia Mother   . Alcohol abuse Father   . Diabetes Sister   . Hypertension Sister   . Ovarian cancer Sister   . Arthritis Sister   . Depression Sister   . Hyperlipidemia Sister   . Other Maternal Grandmother        pacemaker  . Multiple sclerosis Maternal Grandfather        ?  [name]  Arthritis Brother   . COPD Brother   . Heart attack Brother   . Hyperlipidemia Brother   . Stroke Brother   . Asthma Sister   . Depression Sister   . Hyperlipidemia Sister   . Asthma Sister   . Depression Sister   . Asthma Paternal Uncle   :  Social History   Socioeconomic History  . Marital status: Widowed    Spouse name: Not on file  . Number of children: Not on file  . Years of education: Not on file  . Highest education level: Not on file  Occupational History  . Occupation: retired  Scientific laboratory technician  . Financial resource strain: Not on file  . Food insecurity    Worry: Not on file    Inability: Not on file  . Transportation needs    Medical: Not on file    Non-medical: Not on file  Tobacco Use  . Smoking status: Former Smoker    Packs/day: 1.00    Years: 30.00    Pack years: 30.00    Types: Cigarettes    Quit date: 08/27/1990    Years since quitting: 28.7  . Smokeless tobacco: Never Used  Substance and Sexual Activity  . Alcohol use: No  . Drug use: No  . Sexual activity: Never    Birth control/protection: Post-menopausal    Comment: lives alone, no dietary restrictions  Lifestyle  . Physical activity    Days per week: Not on file    Minutes per session: Not on file  . Stress: Not on file  Relationships  . Social Herbalist on phone: Not on file    Gets together: Not on file    Attends religious service: Not on file    Active member of club or organization: Not on file    Attends meetings of clubs or organizations: Not on file    Relationship status: Not on file  . Intimate  partner violence    Fear of current or ex partner: Not on file    Emotionally abused: Not on file    Physically abused: Not on file    Forced sexual activity: Not on file  Other Topics Concern  . Not on file  Social History Narrative   Retired. Lives alone.    Attended some business college.    Former smoker.    Smoke alarm in the home. Wears seat balt.    Wears dentures.    Feels safe in her relationships.      :  Review of Systems  Constitutional: Negative.   Eyes: Negative.   Respiratory: Negative.   Cardiovascular: Negative.   Gastrointestinal: Negative.   Genitourinary: Positive for hematuria.  Musculoskeletal: Positive for back pain.  Skin: Negative.   Neurological: Negative.   Endo/Heme/Allergies: Negative.   Psychiatric/Behavioral: Negative.      Exam: Well-developed and well-nourished white female in no obvious distress.  Vital signs show a temperature of 98.2.  Pulse 82.  Blood pressure 166/74.  Weight is 173 pounds.  Head neck exam shows no ocular or oral lesions.  There are no palpable cervical or supraclavicular lymph nodes.  Lungs are clear bilaterally.  Cardiac exam regular rate and rhythm with normal S1 and S2.  There are no murmurs, rubs or bruits.  Abdomen is soft.  She has good bowel sounds.  There is no fluid wave.  There is no guarding or rebound tenderness.  She has no palpable liver or spleen tip.  There is no inguinal adenopathy bilaterally.  Back exam shows no tenderness over the spine, ribs or hips.  Extremities shows no clubbing, cyanosis or edema.  She has good range of motion of her joints.  She has good strength in her joints.  Skin exam shows no rashes, ecchymoses or petechia.  Neurological exam shows no focal neurological deficits.    @IPVITALS @   Recent Labs    05/18/19 1103  WBC 9.3  HGB 13.4  HCT 41.6  PLT 292   Recent Labs    05/18/19 1103  NA 139  K 3.9  CL 105  CO2 27  GLUCOSE 130*  BUN 12  CREATININE 0.83  CALCIUM 10.5*     Blood smear review: None  Pathology: None    Assessment and Plan: Ms. Betro is a very nice 78 year old white female with a superficial bladder cancer.  This is high-grade.  Again, I told her that superficial bladder cancer is different than invasive bladder cancer.  I would have to believe that she will do quite well.  Of course, bladder cancer that is superficial typically does tend to recur and eventually does become invasive.  Given the fact that her bladder cancer is high-grade, she might be at a higher risk for invasive disease.  We will have to see what the next cystoscopy shows along with biopsies.  She is being treated relatively aggressively with the intravesicular BCG therapy.  I told her this is pretty much standard therapy for superficial bladder cancer.  I told her that I do not think that the discomfort as she was having with her left thigh was related to her having this superficial bladder cancer.  I reassured her that I felt that she is getting outstanding care from Dr. Alyson Ingles.  He is incredibly knowledgeable and very talented.  I know that he will do right by her and treat this aggressively.  I would like to see her back probably in November.  I then, she may be done with the intrafascicular therapy.  I spent about an hour with her today.  I mostly just reassured her that I just do not see that cancer was going to be a problem for her with respect to this superficial bladder cancer.  We did have an excellent prayer session.  I gave her a prayer blanket which she was very thankful for.

## 2019-05-19 ENCOUNTER — Telehealth: Payer: Self-pay | Admitting: Hematology & Oncology

## 2019-05-19 ENCOUNTER — Encounter: Payer: Self-pay | Admitting: *Deleted

## 2019-05-19 LAB — LACTATE DEHYDROGENASE: LDH: 143 U/L (ref 98–192)

## 2019-05-19 NOTE — Telephone Encounter (Signed)
Spoke with patient to confirm PET and NOv appts per 9/21 LOS

## 2019-05-19 NOTE — Progress Notes (Signed)
Initial RN Navigator Patient Visit  Name: Christina [name] Date of Referral : 05/13/19 Diagnosis: High-grade superficial bladder cancer  Navigator out of office during new patient appointment. Another RN gave patient "Your Patient Navigator" handout which explains my role, areas in which I am able to help, and all the contact information for myself and the office. Also included patient MD and Navigator business card.  New patient packet given to patient which includes: orientation to office and staff; campus directory; education on My Chart and Advance Directives; and patient centered education on bladder cancer.   Patient completed visit with Dr. Marin Olp  This morning chart review was completed. Spoke with Dr Marin Olp. Patient will need   PET Scan - Scheduled for 05/27/2019  Called the patient to follow up after appointment yesterday. Patient understands all follow up procedures and expectations. They have my number to reach out for any further clarification or additional needs.

## 2019-05-27 ENCOUNTER — Ambulatory Visit (HOSPITAL_COMMUNITY)
Admission: RE | Admit: 2019-05-27 | Discharge: 2019-05-27 | Disposition: A | Discharge status: Home / Self Care | Payer: Medicare Other | Source: Ambulatory Visit | Attending: Hematology & Oncology | Admitting: Hematology & Oncology

## 2019-05-27 ENCOUNTER — Other Ambulatory Visit: Payer: Self-pay

## 2019-05-27 ENCOUNTER — Encounter (HOSPITAL_BASED_OUTPATIENT_CLINIC_OR_DEPARTMENT_OTHER): Payer: Self-pay | Admitting: *Deleted

## 2019-05-27 DIAGNOSIS — I700 Atherosclerosis of aorta: Secondary | ICD-10-CM | POA: Diagnosis not present

## 2019-05-27 DIAGNOSIS — Z79899 Other long term (current) drug therapy: Secondary | ICD-10-CM | POA: Insufficient documentation

## 2019-05-27 DIAGNOSIS — D3501 Benign neoplasm of right adrenal gland: Secondary | ICD-10-CM | POA: Insufficient documentation

## 2019-05-27 DIAGNOSIS — J439 Emphysema, unspecified: Secondary | ICD-10-CM | POA: Diagnosis not present

## 2019-05-27 DIAGNOSIS — Z87891 Personal history of nicotine dependence: Secondary | ICD-10-CM | POA: Insufficient documentation

## 2019-05-27 DIAGNOSIS — C672 Malignant neoplasm of lateral wall of bladder: Secondary | ICD-10-CM | POA: Diagnosis not present

## 2019-05-27 DIAGNOSIS — I10 Essential (primary) hypertension: Secondary | ICD-10-CM | POA: Insufficient documentation

## 2019-05-27 LAB — GLUCOSE, CAPILLARY: Glucose-Capillary: 98 mg/dL (ref 70–99)

## 2019-05-27 MED ORDER — FLUDEOXYGLUCOSE F - 18 (FDG) INJECTION
8.6000 | Freq: Once | INTRAVENOUS | Status: AC | PRN
  Administered 2019-05-27: 12:00:00 8.6 via INTRAVENOUS

## 2019-05-27 NOTE — Progress Notes (Signed)
Spoke with patient via telephone for pre op interview. NPO after MN. No medications AM of surgery. Arrival time 1000.

## 2019-05-28 ENCOUNTER — Other Ambulatory Visit (HOSPITAL_COMMUNITY)
Admission: RE | Admit: 2019-05-28 | Discharge: 2019-05-28 | Disposition: A | Discharge status: Home / Self Care | Payer: Medicare Other | Source: Ambulatory Visit | Attending: Urology | Admitting: Urology

## 2019-05-28 ENCOUNTER — Ambulatory Visit (HOSPITAL_COMMUNITY): Payer: Medicare Other

## 2019-05-28 ENCOUNTER — Other Ambulatory Visit (HOSPITAL_COMMUNITY): Payer: Medicare Other

## 2019-05-28 DIAGNOSIS — Z01812 Encounter for preprocedural laboratory examination: Secondary | ICD-10-CM | POA: Insufficient documentation

## 2019-05-28 DIAGNOSIS — Z20828 Contact with and (suspected) exposure to other viral communicable diseases: Secondary | ICD-10-CM | POA: Insufficient documentation

## 2019-05-29 LAB — NOVEL CORONAVIRUS, NAA (HOSP ORDER, SEND-OUT TO REF LAB; TAT 18-24 HRS): SARS-CoV-2, NAA: NOT DETECTED

## 2019-06-01 ENCOUNTER — Ambulatory Visit (HOSPITAL_BASED_OUTPATIENT_CLINIC_OR_DEPARTMENT_OTHER): Payer: Medicare Other | Admitting: Anesthesiology

## 2019-06-01 ENCOUNTER — Encounter (HOSPITAL_BASED_OUTPATIENT_CLINIC_OR_DEPARTMENT_OTHER): Payer: Self-pay | Admitting: *Deleted

## 2019-06-01 ENCOUNTER — Encounter (HOSPITAL_BASED_OUTPATIENT_CLINIC_OR_DEPARTMENT_OTHER)
Admission: RE | Disposition: A | Discharge status: Home / Self Care | Payer: Self-pay | Source: Home / Self Care | Attending: Urology

## 2019-06-01 ENCOUNTER — Other Ambulatory Visit: Payer: Self-pay

## 2019-06-01 ENCOUNTER — Ambulatory Visit (HOSPITAL_BASED_OUTPATIENT_CLINIC_OR_DEPARTMENT_OTHER)
Admission: RE | Admit: 2019-06-01 | Discharge: 2019-06-01 | Disposition: A | Discharge status: Home / Self Care | Payer: Medicare Other | Attending: Urology | Admitting: Urology

## 2019-06-01 DIAGNOSIS — R7303 Prediabetes: Secondary | ICD-10-CM | POA: Diagnosis not present

## 2019-06-01 DIAGNOSIS — Z8551 Personal history of malignant neoplasm of bladder: Secondary | ICD-10-CM | POA: Insufficient documentation

## 2019-06-01 DIAGNOSIS — Z6829 Body mass index (BMI) 29.0-29.9, adult: Secondary | ICD-10-CM | POA: Diagnosis not present

## 2019-06-01 DIAGNOSIS — E663 Overweight: Secondary | ICD-10-CM | POA: Diagnosis not present

## 2019-06-01 DIAGNOSIS — Z888 Allergy status to other drugs, medicaments and biological substances status: Secondary | ICD-10-CM | POA: Insufficient documentation

## 2019-06-01 DIAGNOSIS — M1990 Unspecified osteoarthritis, unspecified site: Secondary | ICD-10-CM | POA: Diagnosis not present

## 2019-06-01 DIAGNOSIS — M8580 Other specified disorders of bone density and structure, unspecified site: Secondary | ICD-10-CM | POA: Insufficient documentation

## 2019-06-01 DIAGNOSIS — C672 Malignant neoplasm of lateral wall of bladder: Secondary | ICD-10-CM | POA: Insufficient documentation

## 2019-06-01 DIAGNOSIS — F419 Anxiety disorder, unspecified: Secondary | ICD-10-CM | POA: Insufficient documentation

## 2019-06-01 DIAGNOSIS — Z87891 Personal history of nicotine dependence: Secondary | ICD-10-CM | POA: Insufficient documentation

## 2019-06-01 DIAGNOSIS — J449 Chronic obstructive pulmonary disease, unspecified: Secondary | ICD-10-CM | POA: Insufficient documentation

## 2019-06-01 DIAGNOSIS — I10 Essential (primary) hypertension: Secondary | ICD-10-CM | POA: Diagnosis not present

## 2019-06-01 HISTORY — PX: TRANSURETHRAL RESECTION OF BLADDER TUMOR: SHX2575

## 2019-06-01 SURGERY — TURBT (TRANSURETHRAL RESECTION OF BLADDER TUMOR)
Anesthesia: General | Site: Bladder

## 2019-06-01 MED ORDER — OXYCODONE-ACETAMINOPHEN 5-325 MG PO TABS: 1.0000 | ORAL_TABLET | ORAL | 0 refills | Status: DC | PRN

## 2019-06-01 MED ORDER — OXYCODONE HCL 5 MG/5ML PO SOLN
5.0000 mg | Freq: Once | ORAL | Status: DC | PRN
  Filled 2019-06-01: qty 5

## 2019-06-01 MED ORDER — CEFAZOLIN SODIUM-DEXTROSE 2-4 GM/100ML-% IV SOLN
2.0000 g | INTRAVENOUS | Status: AC
  Administered 2019-06-01 (×2): 2 g via INTRAVENOUS
  Filled 2019-06-01: qty 100

## 2019-06-01 MED ORDER — MIDAZOLAM HCL 2 MG/2ML IJ SOLN
INTRAMUSCULAR | Status: AC
  Filled 2019-06-01: qty 2

## 2019-06-01 MED ORDER — ONDANSETRON HCL 4 MG/2ML IJ SOLN
INTRAMUSCULAR | Status: DC | PRN
  Administered 2019-06-01: 4 mg via INTRAVENOUS

## 2019-06-01 MED ORDER — PROPOFOL 10 MG/ML IV BOLUS
INTRAVENOUS | Status: DC | PRN
  Administered 2019-06-01: 150 mg via INTRAVENOUS

## 2019-06-01 MED ORDER — EPHEDRINE 5 MG/ML INJ
INTRAVENOUS | Status: AC
  Filled 2019-06-01: qty 10

## 2019-06-01 MED ORDER — CEFAZOLIN SODIUM-DEXTROSE 2-4 GM/100ML-% IV SOLN
INTRAVENOUS | Status: AC
  Filled 2019-06-01: qty 100

## 2019-06-01 MED ORDER — HYOSCYAMINE SULFATE SL 0.125 MG SL SUBL: 1.0000 | SUBLINGUAL_TABLET | Freq: Three times a day (TID) | SUBLINGUAL | 0 refills | Status: DC | PRN

## 2019-06-01 MED ORDER — KETOROLAC TROMETHAMINE 30 MG/ML IJ SOLN
INTRAMUSCULAR | Status: AC
  Filled 2019-06-01: qty 1

## 2019-06-01 MED ORDER — FENTANYL CITRATE (PF) 100 MCG/2ML IJ SOLN
INTRAMUSCULAR | Status: AC
  Filled 2019-06-01: qty 2

## 2019-06-01 MED ORDER — PHENAZOPYRIDINE HCL 100 MG PO TABS
ORAL_TABLET | ORAL | Status: AC
  Filled 2019-06-01: qty 2

## 2019-06-01 MED ORDER — DEXAMETHASONE SODIUM PHOSPHATE 10 MG/ML IJ SOLN
INTRAMUSCULAR | Status: DC | PRN
  Administered 2019-06-01: 4 mg via INTRAVENOUS

## 2019-06-01 MED ORDER — BELLADONNA ALKALOIDS-OPIUM 16.2-60 MG RE SUPP
RECTAL | Status: AC
  Filled 2019-06-01: qty 1

## 2019-06-01 MED ORDER — OXYCODONE HCL 5 MG PO TABS
5.0000 mg | ORAL_TABLET | Freq: Once | ORAL | Status: DC | PRN
  Filled 2019-06-01: qty 1

## 2019-06-01 MED ORDER — LACTATED RINGERS IV SOLN
INTRAVENOUS | Status: DC
  Administered 2019-06-01: 10:00:00 via INTRAVENOUS
  Filled 2019-06-01: qty 1000

## 2019-06-01 MED ORDER — FENTANYL CITRATE (PF) 100 MCG/2ML IJ SOLN
INTRAMUSCULAR | Status: DC | PRN
  Administered 2019-06-01 (×2): 50 ug via INTRAVENOUS

## 2019-06-01 MED ORDER — LIDOCAINE 2% (20 MG/ML) 5 ML SYRINGE
INTRAMUSCULAR | Status: AC
  Filled 2019-06-01: qty 5

## 2019-06-01 MED ORDER — BELLADONNA ALKALOIDS-OPIUM 16.2-60 MG RE SUPP
RECTAL | Status: DC | PRN
  Administered 2019-06-01: 1 via RECTAL

## 2019-06-01 MED ORDER — FENTANYL CITRATE (PF) 100 MCG/2ML IJ SOLN
25.0000 ug | INTRAMUSCULAR | Status: DC | PRN
  Administered 2019-06-01: 25 ug via INTRAVENOUS
  Filled 2019-06-01: qty 1

## 2019-06-01 MED ORDER — MIDAZOLAM HCL 2 MG/2ML IJ SOLN
INTRAMUSCULAR | Status: DC | PRN
  Administered 2019-06-01 (×2): 0.5 mg via INTRAVENOUS

## 2019-06-01 MED ORDER — PHENAZOPYRIDINE HCL 200 MG PO TABS
200.0000 mg | ORAL_TABLET | ORAL | Status: AC
  Administered 2019-06-01: 200 mg via ORAL
  Filled 2019-06-01: qty 1

## 2019-06-01 MED ORDER — ONDANSETRON HCL 4 MG/2ML IJ SOLN
INTRAMUSCULAR | Status: AC
  Filled 2019-06-01: qty 2

## 2019-06-01 MED ORDER — SODIUM CHLORIDE 0.9 % IR SOLN
Status: DC | PRN
  Administered 2019-06-01: 3000 mL via INTRAVESICAL

## 2019-06-01 MED ORDER — ONDANSETRON HCL 4 MG/2ML IJ SOLN
4.0000 mg | Freq: Once | INTRAMUSCULAR | Status: DC | PRN
  Filled 2019-06-01: qty 2

## 2019-06-01 MED ORDER — DEXAMETHASONE SODIUM PHOSPHATE 10 MG/ML IJ SOLN
INTRAMUSCULAR | Status: AC
  Filled 2019-06-01: qty 1

## 2019-06-01 MED ORDER — ACETAMINOPHEN 500 MG PO TABS
ORAL_TABLET | ORAL | Status: AC
  Filled 2019-06-01: qty 2

## 2019-06-01 MED ORDER — LIDOCAINE 2% (20 MG/ML) 5 ML SYRINGE
INTRAMUSCULAR | Status: DC | PRN
  Administered 2019-06-01: 40 mg via INTRAVENOUS

## 2019-06-01 MED ORDER — PROPOFOL 10 MG/ML IV BOLUS
INTRAVENOUS | Status: AC
  Filled 2019-06-01: qty 20

## 2019-06-01 MED ORDER — EPHEDRINE SULFATE-NACL 50-0.9 MG/10ML-% IV SOSY
PREFILLED_SYRINGE | INTRAVENOUS | Status: DC | PRN
  Administered 2019-06-01: 5 mg via INTRAVENOUS

## 2019-06-01 MED ORDER — ACETAMINOPHEN 325 MG PO TABS
ORAL_TABLET | ORAL | Status: DC | PRN
  Administered 2019-06-01: 1000 mg via ORAL

## 2019-06-01 MED ORDER — KETOROLAC TROMETHAMINE 15 MG/ML IJ SOLN
INTRAMUSCULAR | Status: DC | PRN
  Administered 2019-06-01: 15 mg via INTRAVENOUS

## 2019-06-01 SURGICAL SUPPLY — 23 items
BAG DRAIN URO-CYSTO SKYTR STRL (DRAIN) ×2 IMPLANT
BAG DRN UROCATH (DRAIN) ×1
BAG URINE DRAINAGE (UROLOGICAL SUPPLIES) ×2 IMPLANT
BAG URINE LEG 500ML (DRAIN) IMPLANT
CATH FOLEY 2WAY SLVR  5CC 20FR (CATHETERS) ×1
CATH FOLEY 2WAY SLVR 5CC 20FR (CATHETERS) IMPLANT
CATH FOLEY 3WAY 30CC 22F (CATHETERS) ×1 IMPLANT
CLOTH BEACON ORANGE TIMEOUT ST (SAFETY) ×1 IMPLANT
ELECT REM PT RETURN 9FT ADLT (ELECTROSURGICAL)
ELECTRODE REM PT RTRN 9FT ADLT (ELECTROSURGICAL) ×1 IMPLANT
GLOVE BIO SURGEON STRL SZ8 (GLOVE) ×2 IMPLANT
GOWN STRL REUS W/TWL XL LVL3 (GOWN DISPOSABLE) ×2 IMPLANT
HOLDER FOLEY CATH W/STRAP (MISCELLANEOUS) ×1 IMPLANT
IV NS IRRIG 3000ML ARTHROMATIC (IV SOLUTION) ×2 IMPLANT
KIT TURNOVER CYSTO (KITS) ×2 IMPLANT
LOOP CUT BIPOLAR 24F LRG (ELECTROSURGICAL) ×1 IMPLANT
MANIFOLD NEPTUNE II (INSTRUMENTS) ×2 IMPLANT
PACK CYSTO (CUSTOM PROCEDURE TRAY) ×2 IMPLANT
PLUG CATH AND CAP STER (CATHETERS) IMPLANT
SYR 30ML LL (SYRINGE) ×1 IMPLANT
SYRINGE IRR TOOMEY STRL 70CC (SYRINGE) IMPLANT
TUBE CONNECTING 12X1/4 (SUCTIONS) ×2 IMPLANT
TUBING UROLOGY SET (TUBING) ×2 IMPLANT

## 2019-06-01 NOTE — Transfer of Care (Signed)
Last Vitals:  Vitals Value Taken Time  BP 134/63 06/01/19 1316  Temp    Pulse 89 06/01/19 1318  Resp 18 06/01/19 1318  SpO2 97 % 06/01/19 1318  Vitals shown include unvalidated device data.  Last Pain:  Vitals:   06/01/19 1005  TempSrc: Oral         Immediate Anesthesia Transfer of Care Note  Patient: Christina [name]  Procedure(s) Performed: Procedure(s) (LRB): TRANSURETHRAL RESECTION OF BLADDER TUMOR (TURBT) (N/A)  Patient Location: PACU  Anesthesia Type: General  Level of Consciousness: awake, alert  and oriented  Airway & Oxygen Therapy: Patient Spontanous Breathing and Patient connected to nasal cannula oxygen  Post-op Assessment: Report given to PACU RN and Post -op Vital signs reviewed and stable  Post vital signs: Reviewed and stable  Complications: No apparent anesthesia complications

## 2019-06-01 NOTE — H&P (Signed)
Urology Admission H&P  Chief Complaint: bladder cancer  History of Present Illness: Christina [name] is a 78yo with a hx of high grade bladder cancer here for repeat resection. No LUTS. No pain  Past Medical History:  Diagnosis Date  . Arthritis   . Asthma    a little?, no problems in several years  . Bladder tumor   . Cervical cancer screening 01/31/2012  . Chicken pox as a child  . Degenerative tear of acetabular labrum of left hip 02/26/2019  . Depression with anxiety 06/07/2009   Qualifier: Diagnosis of  By: [name] MD, [name]    . Dermatitis of external ear 07/11/2012  . Diverticulosis   . Dysphagia 01/31/2012  . Hiatal hernia with gastroesophageal reflux 10/26/2010   Qualifier: Diagnosis of  By: [name] MD, [name]    . Hyperglycemia 06/21/2013  . Hyperlipidemia, mixed 10/16/2015   pt unaware  . Hypertension   . Left hip pain 07/10/2015  . Lesion of breast 12/03/2016   benign; resolved  . Low back pain 05/29/2015  . Measles as a child  . Mumps as a child  . Neck pain on left side 06/21/2013  . Osteopenia 01/03/2017  . Overweight (BMI 25.0-29.9) 10/07/2008   Qualifier: Diagnosis of  By: [name] MD, [name]    . Palpitations    several years ago, not currently  . Pre-diabetes   . Psoriasis    ears  . RUQ pain 05/29/2015  . Spider veins    Bilateral legs  . Spinal stenosis   . Umbilical hernia   . Urinary frequency 09/28/2011  . Vertigo   . Wears dentures    Upper,   Past Surgical History:  Procedure Laterality Date  . ABDOMINAL SURGERY  1970's  . BREAST BIOPSY Right 1980   BREAST EXCISIONAL BIOPSY  . BREAST BIOPSY Left 1970   BREAST EXCISIONAL BIOPSY  . BREAST CYST ASPIRATION    . CATARACT EXTRACTION, BILATERAL    . COLONOSCOPY    . CYSTECTOMY     abdomen  . CYSTOSCOPY W/ RETROGRADES Bilateral 04/27/2019   Procedure: CYSTOSCOPY WITH RETROGRADE PYELOGRAM;  Surgeon: Cleon Gustin, MD;  Location[facility];  Service: Urology;  Laterality:  Bilateral;  . EYE SURGERY Bilateral    cataract  . INSERTION OF MESH N/A 06/11/2017   Procedure: INSERTION OF MESH;  Surgeon: Rolm Bookbinder, MD;  Location: [place];  Service: General;  Laterality: N/A;  BILATERAL TAP BLOCK  . TRANSURETHRAL RESECTION OF BLADDER TUMOR N/A 04/27/2019   Procedure: TRANSURETHRAL RESECTION OF BLADDER TUMOR (TURBT);  Surgeon: Cleon Gustin, MD;  Location: [facility];  Service: Urology;  Laterality: N/A;  1 HR  . TUBAL LIGATION  1970  . VENTRAL HERNIA REPAIR N/A 06/11/2017   Procedure: LAPAROSCOPIC VENTRAL HERNIA REPAIR WITH MESH ERAS PATHWAY;  Surgeon: Rolm Bookbinder, MD;  Location: [place];  Service: General;  Laterality: N/A;  BILATERAL TAP BLOCK    Home Medications:  Current Facility-Administered Medications  Medication Dose Route Frequency Provider Last Rate Last Dose  . ceFAZolin (ANCEF) IVPB 2g/100 mL premix  2 g Intravenous 30 min Pre-Op Alyson Ingles Candee Furbish, MD      . lactated ringers infusion   Intravenous Continuous Lyn Hollingshead, MD 50 mL/hr at 06/01/19 1029     Allergies:  Allergies  Allergen Reactions  . Nutrasweet Aspartame [Aspartame] Diarrhea  . Lisinopril Cough    Family History  Problem Relation Age of Onset  . Hypertension Mother   .  Anxiety disorder Mother   . Ovarian cancer Mother 45       lung- smoker, ovarian  . Lung cancer Mother        smoker  . Arthritis Mother   . Hyperlipidemia Mother   . Alcohol abuse Father   . Diabetes Sister   . Hypertension Sister   . Ovarian cancer Sister   . Arthritis Sister   . Depression Sister   . Hyperlipidemia Sister   . Other Maternal Grandmother        pacemaker  . Multiple sclerosis Maternal Grandfather        ?  [name] Arthritis Brother   . COPD Brother   . Heart attack Brother   . Hyperlipidemia Brother   . Stroke Brother   . Asthma Sister   . Depression Sister   . Hyperlipidemia Sister   . Asthma Sister   . Depression Sister   . Asthma Paternal  Uncle    Social History:  reports that she quit smoking about 28 years ago. Her smoking use included cigarettes. She has a 30.00 pack-year smoking history. She has never used smokeless tobacco. She reports that she does not drink alcohol or use drugs.  Review of Systems  All other systems reviewed and are negative.   Physical Exam:  Vital signs in last 24 hours: Temp:  [98.6 F (37 C)] 98.6 F (37 C) (10/05 1005) Pulse Rate:  [76] 76 (10/05 1005) Resp:  [16] 16 (10/05 1005) BP: (150)/(69) 150/69 (10/05 1005) SpO2:  [96 %] 96 % (10/05 1005) Weight:  [77.8 kg] 77.8 kg (10/05 1005) Physical Exam  Constitutional: She is oriented to person, place, and time. She appears well-developed and well-nourished.  HENT:  Head: Normocephalic and atraumatic.  Eyes: Pupils are equal, round, and reactive to light. EOM are normal.  Neck: Normal range of motion. No thyromegaly present.  Cardiovascular: Normal rate and regular rhythm.  Respiratory: Effort normal. No respiratory distress.  GI: Soft. She exhibits no distension.  Musculoskeletal: Normal range of motion.        General: No edema.  Neurological: She is alert and oriented to person, place, and time.  Skin: Skin is warm and dry.  Psychiatric: She has a normal mood and affect. Her behavior is normal. Judgment and thought content normal.    Laboratory Data:  No results found for this or any previous visit (from the past 24 hour(s)). Recent Results (from the past 240 hour(s))  Novel Coronavirus, NAA (Hosp order, Send-out to Ref Lab; TAT 18-24 hrs     Status: None   Collection Time: 05/28/19  2:04 PM   Specimen: Nasopharyngeal Swab; Respiratory  Result Value Ref Range Status   SARS-CoV-2, NAA NOT DETECTED NOT DETECTED Final    Comment: (NOTE) This nucleic acid amplification test was developed and its performance characteristics determined by [employer]. Nucleic acid amplification tests include PCR and TMA. This test has not  been FDA cleared or approved. This test has been authorized by FDA under an Emergency Use Authorization (EUA). This test is only authorized for the duration of time the declaration that circumstances exist justifying the authorization of the emergency use of in vitro diagnostic tests for detection of SARS-CoV-2 virus and/or diagnosis of COVID-19 infection under section 564(b)(1) of the Act, 21 U.S.C. [id number]) (1), unless the authorization is terminated or revoked sooner. When diagnostic testing is negative, the possibility of a false negative result should be considered in the context of a patient's recent exposures  and the presence of clinical signs and symptoms consistent with COVID-19. An individual without symptoms of COVID- 19 and who is not shedding SARS-CoV-2 vi rus would expect to have a negative (not detected) result in this assay. Performed At: [facility] [address] [place], [place] [id number] Rush Farmer MD [id number]    [place]  Final    Comment: Performed at [facility], [place] [address], [place], [place] [address]   Creatinine: No results for input(s): CREATININE in the last 168 hours. Baseline Creatinine: unknown  Impression/Assessment:  78yo with high grade bladder cancer  Plan:  The risks/benefits/alternatives to TURBT was explained to the patient and she understands and wishes to proceed with surgery  Nicolette Bang 06/01/2019, 12:30 PM

## 2019-06-01 NOTE — Anesthesia Procedure Notes (Signed)
Procedure Name: LMA Insertion Date/Time: 06/01/2019 12:47 PM Performed by: Mechele Claude, CRNA Pre-anesthesia Checklist: Patient identified, Emergency Drugs available, Suction available and Patient being monitored Patient Re-evaluated:Patient Re-evaluated prior to induction Oxygen Delivery Method: Circle system utilized Preoxygenation: Pre-oxygenation with 100% oxygen Induction Type: IV induction Ventilation: Mask ventilation without difficulty LMA: LMA inserted LMA Size: 4.0 Number of attempts: 1 Airway Equipment and Method: Bite block Placement Confirmation: positive ETCO2 Tube secured with: Tape Dental Injury: Teeth and Oropharynx as per pre-operative assessment

## 2019-06-01 NOTE — Discharge Instructions (Signed)
Post Anesthesia Home Care Instructions  Activity: Get plenty of rest for the remainder of the day. A responsible individual must stay with you for 24 hours following the procedure.  For the next 24 hours, DO NOT: -Drive a car -Paediatric nurse -Drink alcoholic beverages -Take any medication unless instructed by your physician -Make any legal decisions or sign important papers.  Meals: Start with liquid foods such as gelatin or soup. Progress to regular foods as tolerated. Avoid greasy, spicy, heavy foods. If nausea and/or vomiting occur, drink only clear liquids until the nausea and/or vomiting subsides. Call your physician if vomiting continues.  Special Instructions/Symptoms: Your throat may feel dry or sore from the anesthesia or the breathing tube placed in your throat during surgery. If this causes discomfort, gargle with warm salt water. The discomfort should disappear within 24 hours.  If you had a scopolamine patch placed behind your ear for the management of post- operative nausea and/or vomiting:  1. The medication in the patch is effective for 72 hours, after which it should be removed.  Wrap patch in a tissue and discard in the trash. Wash hands thoroughly with soap and water. 2. You may remove the patch earlier than 72 hours if you experience unpleasant side effects which may include dry mouth, dizziness or visual disturbances. 3. Avoid touching the patch. Wash your hands with soap and water after contact with the patch.  Indwelling Urinary Catheter Care, Adult An indwelling urinary catheter is a thin tube that is put into your bladder. The tube helps to drain pee (urine) out of your body. The tube goes in through your urethra. Your urethra is where pee comes out of your body. Your pee will come out through the catheter, then it will go into a bag (drainage bag). Take good care of your catheter so it will work well. How to wear your catheter and bag Supplies  needed  Sticky tape (adhesive tape) or a leg strap.  Alcohol wipe or soap and water (if you use tape).  A clean towel (if you use tape).  Large overnight bag.  Smaller bag (leg bag). Wearing your catheter Attach your catheter to your leg with tape or a leg strap.  Make sure the catheter is not pulled tight.  If a leg strap gets wet, take it off and put on a dry strap.  If you use tape to hold the bag on your leg: 1. Use an alcohol wipe or soap and water to wash your skin where the tape made it sticky before. 2. Use a clean towel to pat-dry that skin. 3. Use new tape to make the bag stay on your leg. Wearing your bags You should have been given a large overnight bag.  You may wear the overnight bag in the day or night.  Always have the overnight bag lower than your bladder.  Do not let the bag touch the floor.  Before you go to sleep, put a clean plastic bag in a wastebasket. Then hang the overnight bag inside the wastebasket. You should also have a smaller leg bag that fits under your clothes.  Always wear the leg bag below your knee.  Do not wear your leg bag at night. How to care for your skin and catheter Supplies needed  A clean washcloth.  Water and mild soap.  A clean towel. Caring for your skin and catheter      Clean the skin around your catheter every day: ? Wash your hands  with soap and water. ? Wet a clean washcloth in warm water and mild soap. ? Clean the skin around your urethra. ? If you are female: ? Gently spread the folds of skin around your vagina (labia). ? With the washcloth in your other hand, wipe the inner side of your labia on each side. Wipe from front to back. ? If you are female: ? Pull back any skin that covers the end of your penis (foreskin). ? With the washcloth in your other hand, wipe your penis in small circles. Start wiping at the tip of your penis, then move away from the catheter. ? Move the foreskin back in place, if  needed. ? With your free hand, hold the catheter close to where it goes into your body. ? Keep holding the catheter during cleaning so it does not get pulled out. ? With the washcloth in your other hand, clean the catheter. ? Only wipe downward on the catheter. ? Do not wipe upward toward your body. Doing this may push germs into your urethra and cause infection. ? Use a clean towel to pat-dry the catheter and the skin around it. Make sure to wipe off all soap. ? Wash your hands with soap and water.  Shower every day. Do not take baths.  Do not use cream, ointment, or lotion on the area where the catheter goes into your body, unless your doctor tells you to.  Do not use powders, sprays, or lotions on your genital area.  Check your skin around the catheter every day for signs of infection. Check for: ? Redness, swelling, or pain. ? Fluid or blood. ? Warmth. ? Pus or a bad smell. How to empty the bag Supplies needed  Rubbing alcohol.  Gauze pad or cotton ball.  Tape or a leg strap. Emptying the bag Pour the pee out of your bag when it is ?- full, or at least 2-3 times a day. Do this for your overnight bag and your leg bag. 1. Wash your hands with soap and water. 2. Separate (detach) the bag from your leg. 3. Hold the bag over the toilet or a clean pail. Keep the bag lower than your hips and bladder. This is so the pee (urine) does not go back into the tube. 4. Open the pour spout. It is at the bottom of the bag. 5. Empty the pee into the toilet or pail. Do not let the pour spout touch any surface. 6. Put rubbing alcohol on a gauze pad or cotton ball. 7. Use the gauze pad or cotton ball to clean the pour spout. 8. Close the pour spout. 9. Attach the bag to your leg with tape or a leg strap. 10. Wash your hands with soap and water. Follow instructions for cleaning the drainage bag:  From the product maker.  As told by your doctor. How to change the bag Supplies  needed  Alcohol wipes.  A clean bag.  Tape or a leg strap. Changing the bag Replace your bag when it starts to leak, smell bad, or look dirty. 1. Wash your hands with soap and water. 2. Separate the dirty bag from your leg. 3. Pinch the catheter with your fingers so that pee does not spill out. 4. Separate the catheter tube from the bag tube where these tubes connect (at the connection valve). Do not let the tubes touch any surface. 5. Clean the end of the catheter tube with an alcohol wipe. Use a different alcohol wipe  to clean the end of the bag tube. 6. Connect the catheter tube to the tube of the clean bag. 7. Attach the clean bag to your leg with tape or a leg strap. Do not make the bag tight on your leg. 8. Wash your hands with soap and water. General rules   Never pull on your catheter. Never try to take it out. Doing that can hurt you.  Always wash your hands before and after you touch your catheter or bag. Use a mild, fragrance-free soap. If you do not have soap and water, use hand sanitizer.  Always make sure there are no twists or bends (kinks) in the catheter tube.  Always make sure there are no leaks in the catheter or bag.  Drink enough fluid to keep your pee pale yellow.  Do not take baths, swim, or use a hot tub.  If you are female, wipe from front to back after you poop (have a bowel movement). Contact a doctor if:  Your pee is cloudy.  Your pee smells worse than usual.  Your catheter gets clogged.  Your catheter leaks.  Your bladder feels full. Get help right away if:  You have redness, swelling, or pain where the catheter goes into your body.  You have fluid, blood, pus, or a bad smell coming from the area where the catheter goes into your body.  Your skin feels warm where the catheter goes into your body.  You have a fever.  You have pain in your: ? Belly (abdomen). ? Legs. ? Lower back. ? Bladder.  You see blood in the catheter.  Your  pee is pink or red.  You feel sick to your stomach (nauseous).  You throw up (vomit).  You have chills.  Your pee is not draining into the bag.  Your catheter gets pulled out. Summary  An indwelling urinary catheter is a thin tube that is placed into the bladder to help drain pee (urine) out of the body.  The catheter is placed into the part of the body that drains pee from the bladder (urethra).  Taking good care of your catheter will keep it working properly and help prevent problems.  Always wash your hands before and after touching your catheter or bag.  Never pull on your catheter or try to take it out. This information is not intended to replace advice given to you by your health care provider. Make sure you discuss any questions you have with your health care provider. Document Released: 12/08/2012 Document Revised: 12/05/2018 Document Reviewed: 03/29/2017 Elsevier Patient Education  2020 [employer].

## 2019-06-01 NOTE — Anesthesia Postprocedure Evaluation (Signed)
Anesthesia Post Note  Patient: Christina [name]  Procedure(s) Performed: TRANSURETHRAL RESECTION OF BLADDER TUMOR (TURBT) (N/A Bladder)     Patient location during evaluation: PACU Anesthesia Type: General Level of consciousness: awake and alert Pain management: pain level controlled Vital Signs Assessment: post-procedure vital signs reviewed and stable Respiratory status: spontaneous breathing, nonlabored ventilation and respiratory function stable Cardiovascular status: blood pressure returned to baseline and stable Postop Assessment: no apparent nausea or vomiting Anesthetic complications: no    Last Vitals:  Vitals:   06/01/19 1330 06/01/19 1345  BP: (!) 145/77 (!) 151/84  Pulse: 82 82  Resp: 15 15  Temp:    SpO2: 94% 96%    Last Pain:  Vitals:   06/01/19 1338  TempSrc:   PainSc: 2                  Lidia Collum

## 2019-06-01 NOTE — Op Note (Signed)
.  Preoperative diagnosis: bladder tumor  Postoperative diagnosis: Same  Procedure: 1 cystoscopy 2. Transurethral resection of bladder tumor, small  Attending: Rosie Fate  Anesthesia: General  Estimated blood loss: Minimal  Drains: 22 French foley  Specimens: right lateral wall tumor  Antibiotics: ancef  Findings: healing right lateral wall resection site. 2 specimens taken from site which were erythematous.   Indications: Patient is a 34 year oldfemale with a history of high grade bladder cancer.  After discussing treatment options, they decided proceed with repeat transurethral resection of a bladder tumor.  Procedure her in detail: The patient was brought to the operating room and a brief timeout was done to ensure correct patient, correct procedure, correct site.  General anesthesia was administered patient was placed in dorsal lithotomy position.  Their genitalia was then prepped and draped in usual sterile fashion.  A rigid 30 French cystoscope was passed in the urethra and the bladder.  Bladder was inspected and we noted a 1cm erythematous area medial to the previous resection site.  the ureteral orifices were in the normal orthotopic locations.  Using the bipolar resectoscope we removed the erythematous area down to the base. Hemostasis was then obtained with electrocautery. We then removed the bladder tumor chips and sent them for pathology. We then re-inspected the bladder and found no residula bleeding.  the bladder was then drained, a 22 French foley was placed and this concluded the procedure which was well tolerated by patient.  Complications: None  Condition: Stable, extubated, transferred to PACU  Plan: Patient is to be discharged home and followup in 3 days for foley catheter removal and pathology discussion.

## 2019-06-01 NOTE — Anesthesia Preprocedure Evaluation (Addendum)
Anesthesia Evaluation  Patient identified by MRN, date of birth, ID band Patient awake    Reviewed: Allergy & Precautions, NPO status , Patient's Chart, lab work & pertinent test results  History of Anesthesia Complications Negative for: history of anesthetic complications  Airway Mallampati: II  TM Distance: >3 FB Neck ROM: Full    Dental  (+) Upper Dentures   Pulmonary asthma , COPD, former smoker,    Pulmonary exam normal        Cardiovascular hypertension, Pt. on medications Normal cardiovascular exam     Neuro/Psych PSYCHIATRIC DISORDERS Anxiety Depression negative neurological ROS     GI/Hepatic Neg liver ROS, hiatal hernia, GERD  ,  Endo/Other  negative endocrine ROS  Renal/GU negative Renal ROS   Bladder tumor    Musculoskeletal negative musculoskeletal ROS (+)   Abdominal   Peds  Hematology negative hematology ROS (+)   Anesthesia Other Findings   Reproductive/Obstetrics                            Anesthesia Physical Anesthesia Plan  ASA: III  Anesthesia Plan: General   Post-op Pain Management:    Induction: Intravenous  PONV Risk Score and Plan: 4 or greater and Ondansetron, Dexamethasone, Treatment may vary due to age or medical condition and Midazolam  Airway Management Planned: LMA  Additional Equipment: None  Intra-op Plan:   Post-operative Plan: Extubation in OR  Informed Consent: I have reviewed the patients History and Physical, chart, labs and discussed the procedure including the risks, benefits and alternatives for the proposed anesthesia with the patient or authorized representative who has indicated his/her understanding and acceptance.     Dental advisory given  Plan Discussed with:   Anesthesia Plan Comments:       Anesthesia Quick Evaluation

## 2019-06-02 ENCOUNTER — Encounter (HOSPITAL_BASED_OUTPATIENT_CLINIC_OR_DEPARTMENT_OTHER): Payer: Self-pay | Admitting: Urology

## 2019-06-02 ENCOUNTER — Ambulatory Visit: Payer: Medicare Other | Admitting: Family Medicine

## 2019-06-02 LAB — SURGICAL PATHOLOGY

## 2019-06-03 ENCOUNTER — Encounter: Payer: Self-pay | Admitting: Family Medicine

## 2019-06-03 DIAGNOSIS — Z8551 Personal history of malignant neoplasm of bladder: Secondary | ICD-10-CM | POA: Insufficient documentation

## 2019-06-03 DIAGNOSIS — C679 Malignant neoplasm of bladder, unspecified: Secondary | ICD-10-CM | POA: Insufficient documentation

## 2019-06-08 ENCOUNTER — Encounter: Payer: Self-pay | Admitting: *Deleted

## 2019-06-08 ENCOUNTER — Telehealth: Payer: Self-pay | Admitting: Family Medicine

## 2019-06-08 ENCOUNTER — Other Ambulatory Visit: Payer: Self-pay

## 2019-06-08 MED ORDER — LOSARTAN POTASSIUM 50 MG PO TABS: 50.0000 mg | ORAL_TABLET | Freq: Every day | ORAL | 1 refills | Status: DC

## 2019-06-08 NOTE — Progress Notes (Signed)
Patient calling to notify this office of procedure where Christina [name] will be performing an injection to help with pain. She states Christina [name] wants to make sure this is okay with her treating physician.   Reviewed chart. Christina [name] is the physician who is providing treatment for her bladder cancer. Christina [name] states that Christina [name] is okay to proceed as long as it's done before the initiation of treatment on November 3rd. Her injection is scheduled for next week. She believes Christina [name] would like Christina [name] okay as well and she states he requested a call.   Christina [name] given all the information regarding the patient's request.

## 2019-06-08 NOTE — Telephone Encounter (Signed)
Patient reports she has very large hemorrhoids and is requesting meds to be called in for her.  Please advise. Patient can be contacted at [phone]

## 2019-06-08 NOTE — Telephone Encounter (Signed)
Called patient and she said that it is getting better. Patient does not want to see another provider for issue. Patient took a Thursday slot. She said if it is not better she will come to the appointment. If it is better she will cancel.

## 2019-06-11 ENCOUNTER — Ambulatory Visit: Payer: Medicare Other | Admitting: Family Medicine

## 2019-06-19 ENCOUNTER — Other Ambulatory Visit: Payer: Self-pay

## 2019-07-06 ENCOUNTER — Inpatient Hospital Stay: Payer: Medicare Other | Attending: Hematology & Oncology | Admitting: Hematology & Oncology

## 2019-07-06 ENCOUNTER — Other Ambulatory Visit: Payer: Self-pay

## 2019-07-06 ENCOUNTER — Inpatient Hospital Stay: Payer: Medicare Other

## 2019-07-06 ENCOUNTER — Encounter: Payer: Self-pay | Admitting: Hematology & Oncology

## 2019-07-06 VITALS — BP 163/69 | HR 76 | Temp 97.9°F | Resp 18 | Wt 171.5 lb

## 2019-07-06 DIAGNOSIS — C672 Malignant neoplasm of lateral wall of bladder: Secondary | ICD-10-CM

## 2019-07-06 DIAGNOSIS — Z79899 Other long term (current) drug therapy: Secondary | ICD-10-CM | POA: Diagnosis not present

## 2019-07-06 DIAGNOSIS — C670 Malignant neoplasm of trigone of bladder: Secondary | ICD-10-CM

## 2019-07-06 LAB — CMP (CANCER CENTER ONLY)
ALT: 12 U/L (ref 0–44)
AST: 17 U/L (ref 15–41)
Albumin: 4.4 g/dL (ref 3.5–5.0)
Alkaline Phosphatase: 59 U/L (ref 38–126)
Anion gap: 7 (ref 5–15)
BUN: 14 mg/dL (ref 8–23)
CO2: 28 mmol/L (ref 22–32)
Calcium: 10.6 mg/dL — ABNORMAL HIGH (ref 8.9–10.3)
Chloride: 104 mmol/L (ref 98–111)
Creatinine: 0.82 mg/dL (ref 0.44–1.00)
GFR, Est AFR Am: >60 mL/min (ref >60)
GFR, Estimated: >60 mL/min (ref >60)
Glucose, Bld: 109 mg/dL — ABNORMAL HIGH (ref 70–99)
Potassium: 4 mmol/L (ref 3.5–5.1)
Sodium: 139 mmol/L (ref 135–145)
Total Bilirubin: 1.1 mg/dL (ref 0.3–1.2)
Total Protein: 6.5 g/dL (ref 6.5–8.1)

## 2019-07-06 LAB — CBC WITH DIFFERENTIAL (CANCER CENTER ONLY)
Abs Immature Granulocytes: 0.02 10*3/uL (ref 0.00–0.07)
Basophils Absolute: 0 10*3/uL (ref 0.0–0.1)
Basophils Relative: 1 %
Eosinophils Absolute: 0.1 10*3/uL (ref 0.0–0.5)
Eosinophils Relative: 1 %
HCT: 43 % (ref 36.0–46.0)
Hemoglobin: 13.8 g/dL (ref 12.0–15.0)
Immature Granulocytes: 0 %
Lymphocytes Relative: 44 %
Lymphs Abs: 3.2 10*3/uL (ref 0.7–4.0)
MCH: 32 pg (ref 26.0–34.0)
MCHC: 32.1 g/dL (ref 30.0–36.0)
MCV: 99.8 fL (ref 80.0–100.0)
Monocytes Absolute: 0.5 10*3/uL (ref 0.1–1.0)
Monocytes Relative: 7 %
Neutro Abs: 3.5 10*3/uL (ref 1.7–7.7)
Neutrophils Relative %: 47 %
Platelet Count: 238 10*3/uL (ref 150–400)
RBC: 4.31 MIL/uL (ref 3.87–5.11)
RDW: 13.8 % (ref 11.5–15.5)
WBC Count: 7.4 10*3/uL (ref 4.0–10.5)
nRBC: 0 % (ref 0.0–0.2)

## 2019-07-06 NOTE — Progress Notes (Signed)
Hematology and Oncology Follow Up Visit  Christina [name] [id number] 02-Aug-1941 78 y.o. 07/06/2019   Principle Diagnosis:   Superficial bladder cancer  Current Therapy:    Intravesicular BCG therapy     Interim History:  Christina [name] is back for second office visit.  We first saw her back in September.  At the time, she was worried regarding her bladder cancer.  I went through all the pathology.  She is being seen by Dr. Alyson Ingles at [facility].  He is doing a very thorough job on her.  She underwent a cystoscopy.  There was a 1 cm area of abnormality.  This was biopsied.  It was found to be bladder cancer.  She has had 1 dose of intravesicular BCG.  I think she is due for 5 more.  This is done weekly.  We did do a PET scan on her.  The PET scan, not surprisingly, did not show any evidence of any type of advanced disease.  She does not have any problems with hematuria.  She feels well.  She wants to be able to do her yard work and go places.  I told her that I had no problems with her doing this.  She is not getting chemotherapy so I will see any problem with her immune system.  She has had no problems with nausea or vomiting.  She has had no cough.  She looking forward to the Thanksgiving holiday with her family.  Overall, her performance status is ECOG 0.  Medications:  Current Outpatient Medications:  .  LORazepam (ATIVAN) 0.5 MG tablet, Take 1 tablet (0.5 mg total) by mouth 2 (two) times daily as needed for anxiety., Disp: 60 tablet, Rfl: 2 .  losartan (COZAAR) 50 MG tablet, Take 1 tablet (50 mg total) by mouth daily., Disp: 90 tablet, Rfl: 1 .  triamcinolone cream (KENALOG) 0.1 %, Apply topically 2 (two) times daily. (Patient taking differently: Apply 1 application topically 2 (two) times daily as needed (in ears). ), Disp: 80 g, Rfl: 0 .  Hyoscyamine Sulfate SL (LEVSIN/SL) 0.125 MG SUBL, Place 1 tablet under the tongue 3 (three) times daily as needed., Disp: 15 tablet,  Rfl: 0 .  Mirabegron (MYRBETRIQ PO), Take by mouth daily., Disp: , Rfl:  .  oxyCODONE-acetaminophen (PERCOCET) 5-325 MG tablet, Take 1 tablet by mouth every 4 (four) hours as needed for severe pain., Disp: 20 tablet, Rfl: 0  Allergies:  Allergies  Allergen Reactions  . Nutrasweet Aspartame [Aspartame] Diarrhea  . Lisinopril Cough    Past Medical History, Surgical history, Social history, and Family History were reviewed and updated.  Review of Systems: Review of Systems  Constitutional: Negative.   HENT:  Negative.   Eyes: Negative.   Respiratory: Negative.   Cardiovascular: Negative.   Gastrointestinal: Negative.   Endocrine: Negative.   Genitourinary: Negative.    Musculoskeletal: Negative.   Skin: Negative.   Neurological: Negative.   Hematological: Negative.   Psychiatric/Behavioral: Negative.     Physical Exam:  weight is 171 lb 8 oz (77.8 kg). Her temporal temperature is 97.9 F (36.6 C). Her blood pressure is 163/69 (abnormal) and her pulse is 76. Her respiration is 18 and oxygen saturation is 99%.   Wt Readings from Last 3 Encounters:  07/06/19 171 lb 8 oz (77.8 kg)  06/01/19 171 lb 7 oz (77.8 kg)  05/18/19 173 lb (78.5 kg)    Physical Exam Vitals signs reviewed.  HENT:     Head: Normocephalic  and atraumatic.  Eyes:     Pupils: Pupils are equal, round, and reactive to light.  Neck:     Musculoskeletal: Normal range of motion.  Cardiovascular:     Rate and Rhythm: Normal rate and regular rhythm.     Heart sounds: Normal heart sounds.  Pulmonary:     Effort: Pulmonary effort is normal.     Breath sounds: Normal breath sounds.  Abdominal:     General: Bowel sounds are normal.     Palpations: Abdomen is soft.  Musculoskeletal: Normal range of motion.        General: No tenderness or deformity.  Lymphadenopathy:     Cervical: No cervical adenopathy.  Skin:    General: Skin is warm and dry.     Findings: No erythema or rash.  Neurological:     Mental  Status: She is alert and oriented to person, place, and time.  Psychiatric:        Behavior: Behavior normal.        Thought Content: Thought content normal.        Judgment: Judgment normal.      Lab Results  Component Value Date   WBC 7.4 07/06/2019   HGB 13.8 07/06/2019   HCT 43.0 07/06/2019   MCV 99.8 07/06/2019   PLT 238 07/06/2019     Chemistry      Component Value Date/Time   NA 139 07/06/2019 1153   NA 142 03/04/2017 1016   K 4.0 07/06/2019 1153   CL 104 07/06/2019 1153   CO2 28 07/06/2019 1153   BUN 14 07/06/2019 1153   BUN 13 03/04/2017 1016   CREATININE 0.82 07/06/2019 1153   CREATININE 0.78 05/20/2015 1704      Component Value Date/Time   CALCIUM 10.6 (H) 07/06/2019 1153   ALKPHOS 59 07/06/2019 1153   AST 17 07/06/2019 1153   ALT 12 07/06/2019 1153   BILITOT 1.1 07/06/2019 1153      Impression and Plan: Christina [name] is a 78 year old white female.  Looks like she has superficial bladder cancer.  She had this partially resected.  She has now undergone intravesicular BCG treatment.  I told her that she would likely have another cystoscopy after the BCG is completed.  I am sure that Dr. Alyson Ingles would do biopsies at that time.  That would dictate whether or not she would need any additional treatment.  I know that on the pathology report, he does mention that this is a high-grade bladder cancer.  Hopefully, this will not become muscle invasive thus needing more intensive therapy.  I thought that I just seen some studies that may have looked at actual immunotherapy with these superficial bladder cancers.  That certainly would be an option in the future.  I would like to see her back in another 2 months.  By then, she will have completed her intravesicular therapy.  I spent about 30 minutes with her today.  I was going over the pathology report.  I went over the cystoscopy report.  I just tried to reassure her that she was going to do well.   Volanda Napoleon,  MD 11/9/20201:35 PM

## 2019-08-07 ENCOUNTER — Telehealth: Payer: Self-pay | Admitting: Family Medicine

## 2019-08-07 MED ORDER — MECLIZINE HCL 25 MG PO TABS: 25.0000 mg | ORAL_TABLET | Freq: Three times a day (TID) | ORAL | 0 refills | Status: DC | PRN

## 2019-08-07 NOTE — Telephone Encounter (Signed)
Called patient several times phone was going straight to voicemail unable to leave message

## 2019-08-07 NOTE — Telephone Encounter (Signed)
Patient had vertigo last night. Patient has an expired Rx from old provider that has helped her. Can she get a new Rx for Meclizine 25MG  sent to [facility]?

## 2019-08-07 NOTE — Telephone Encounter (Signed)
Please advise 

## 2019-08-07 NOTE — Telephone Encounter (Signed)
Typically I do not prescribe medications without evaluating patient prior -if I have never prescribed it for the patient in the past. I am willing to call her in  meclizine since I did see it on her medication list and I do not want her to suffer with it over the weekend.  However if her symptoms are worsening she needs to be seen to make sure diagnosis is/was correct.

## 2019-08-10 NOTE — Telephone Encounter (Signed)
Pt was called and given information, she verbalized understanding  

## 2019-09-03 ENCOUNTER — Inpatient Hospital Stay (HOSPITAL_BASED_OUTPATIENT_CLINIC_OR_DEPARTMENT_OTHER): Payer: Medicare PPO | Admitting: Hematology & Oncology

## 2019-09-03 ENCOUNTER — Encounter: Payer: Self-pay | Admitting: Hematology & Oncology

## 2019-09-03 ENCOUNTER — Telehealth: Payer: Self-pay | Admitting: Hematology & Oncology

## 2019-09-03 ENCOUNTER — Other Ambulatory Visit: Payer: Self-pay

## 2019-09-03 ENCOUNTER — Inpatient Hospital Stay: Payer: Medicare PPO | Attending: Hematology & Oncology

## 2019-09-03 VITALS — BP 131/74 | HR 85 | Temp 97.7°F | Resp 18 | Wt 171.5 lb

## 2019-09-03 DIAGNOSIS — C673 Malignant neoplasm of anterior wall of bladder: Secondary | ICD-10-CM | POA: Diagnosis not present

## 2019-09-03 DIAGNOSIS — C670 Malignant neoplasm of trigone of bladder: Secondary | ICD-10-CM

## 2019-09-03 DIAGNOSIS — Z79899 Other long term (current) drug therapy: Secondary | ICD-10-CM | POA: Diagnosis not present

## 2019-09-03 DIAGNOSIS — C672 Malignant neoplasm of lateral wall of bladder: Secondary | ICD-10-CM | POA: Insufficient documentation

## 2019-09-03 LAB — CMP (CANCER CENTER ONLY)
ALT: 9 U/L (ref 0–44)
AST: 14 U/L — ABNORMAL LOW (ref 15–41)
Albumin: 4 g/dL (ref 3.5–5.0)
Alkaline Phosphatase: 62 U/L (ref 38–126)
Anion gap: 6 (ref 5–15)
BUN: 14 mg/dL (ref 8–23)
CO2: 29 mmol/L (ref 22–32)
Calcium: 10.6 mg/dL — ABNORMAL HIGH (ref 8.9–10.3)
Chloride: 106 mmol/L (ref 98–111)
Creatinine: 0.78 mg/dL (ref 0.44–1.00)
GFR, Est AFR Am: >60 mL/min (ref >60)
GFR, Estimated: >60 mL/min (ref >60)
Glucose, Bld: 137 mg/dL — ABNORMAL HIGH (ref 70–99)
Potassium: 3.9 mmol/L (ref 3.5–5.1)
Sodium: 141 mmol/L (ref 135–145)
Total Bilirubin: 0.8 mg/dL (ref 0.3–1.2)
Total Protein: 6.4 g/dL — ABNORMAL LOW (ref 6.5–8.1)

## 2019-09-03 LAB — CBC WITH DIFFERENTIAL (CANCER CENTER ONLY)
Abs Immature Granulocytes: 0.01 10*3/uL (ref 0.00–0.07)
Basophils Absolute: 0 10*3/uL (ref 0.0–0.1)
Basophils Relative: 0 %
Eosinophils Absolute: 0.1 10*3/uL (ref 0.0–0.5)
Eosinophils Relative: 1 %
HCT: 42.7 % (ref 36.0–46.0)
Hemoglobin: 14.1 g/dL (ref 12.0–15.0)
Immature Granulocytes: 0 %
Lymphocytes Relative: 42 %
Lymphs Abs: 3.1 10*3/uL (ref 0.7–4.0)
MCH: 32.3 pg (ref 26.0–34.0)
MCHC: 33 g/dL (ref 30.0–36.0)
MCV: 97.9 fL (ref 80.0–100.0)
Monocytes Absolute: 0.6 10*3/uL (ref 0.1–1.0)
Monocytes Relative: 8 %
Neutro Abs: 3.6 10*3/uL (ref 1.7–7.7)
Neutrophils Relative %: 49 %
Platelet Count: 273 10*3/uL (ref 150–400)
RBC: 4.36 MIL/uL (ref 3.87–5.11)
RDW: 12.4 % (ref 11.5–15.5)
WBC Count: 7.4 10*3/uL (ref 4.0–10.5)
nRBC: 0 % (ref 0.0–0.2)

## 2019-09-03 NOTE — Progress Notes (Signed)
Hematology and Oncology Follow Up Visit  JODDIE DIGIORGIO [id number] 1940-09-23 79 y.o. 09/03/2019   Principle Diagnosis:   Superficial bladder cancer  Current Therapy:    Intravesicular BCG therapy     Interim History:  Ms. Slayden is back for follow-up.  She is doing pretty well.  She had no problems over the holiday season.  She has completed her intravesicular therapy.  This was a little bit tough on her as she had a hard time trying to hold the BCG for 2 hours.  She goes back to the urologist in February for another cystoscopy.  I told her that I am sure that the cancer would not be found.  However, I also told her that I suspect that the cancer will eventually come back.  Overall, she is doing well.  She says that she was told not to leave her house because of her cancer.  I told her that her cancer is not going to put her at a higher risk for getting the coronavirus.  Her appetite is good.  She has had no nausea or vomiting.  She has had no fever.  She has had no cough.  There is been no rashes.  She has had no headache.  Overall, her performance status is ECOG 0.  Medications:  Current Outpatient Medications:  .  LORazepam (ATIVAN) 0.5 MG tablet, Take 1 tablet (0.5 mg total) by mouth 2 (two) times daily as needed for anxiety., Disp: 60 tablet, Rfl: 2 .  losartan (COZAAR) 50 MG tablet, Take 1 tablet (50 mg total) by mouth daily., Disp: 90 tablet, Rfl: 1 .  meclizine (ANTIVERT) 25 MG tablet, Take 1 tablet (25 mg total) by mouth 3 (three) times daily as needed for dizziness., Disp: 30 tablet, Rfl: 0 .  triamcinolone cream (KENALOG) 0.1 %, Apply topically 2 (two) times daily. (Patient taking differently: Apply 1 application topically 2 (two) times daily as needed (in ears). ), Disp: 80 g, Rfl: 0 .  Hyoscyamine Sulfate SL (LEVSIN/SL) 0.125 MG SUBL, Place 1 tablet under the tongue 3 (three) times daily as needed., Disp: 15 tablet, Rfl: 0 .  Mirabegron (MYRBETRIQ PO), Take by  mouth daily., Disp: , Rfl:   Allergies:  Allergies  Allergen Reactions  . Nutrasweet Aspartame [Aspartame] Diarrhea  . Lisinopril Cough    Past Medical History, Surgical history, Social history, and Family History were reviewed and updated.  Review of Systems: Review of Systems  Constitutional: Negative.   HENT:  Negative.   Eyes: Negative.   Respiratory: Negative.   Cardiovascular: Negative.   Gastrointestinal: Negative.   Endocrine: Negative.   Genitourinary: Negative.    Musculoskeletal: Negative.   Skin: Negative.   Neurological: Negative.   Hematological: Negative.   Psychiatric/Behavioral: Negative.     Physical Exam:  weight is 171 lb 8 oz (77.8 kg). Her temporal temperature is 97.7 F (36.5 C). Her blood pressure is 131/74 and her pulse is 85. Her respiration is 18 and oxygen saturation is 98%.   Wt Readings from Last 3 Encounters:  09/03/19 171 lb 8 oz (77.8 kg)  07/06/19 171 lb 8 oz (77.8 kg)  06/01/19 171 lb 7 oz (77.8 kg)    Physical Exam Vitals reviewed.  HENT:     Head: Normocephalic and atraumatic.  Eyes:     Pupils: Pupils are equal, round, and reactive to light.  Cardiovascular:     Rate and Rhythm: Normal rate and regular rhythm.     Heart sounds:  Normal heart sounds.  Pulmonary:     Effort: Pulmonary effort is normal.     Breath sounds: Normal breath sounds.  Abdominal:     General: Bowel sounds are normal.     Palpations: Abdomen is soft.  Musculoskeletal:        General: No tenderness or deformity. Normal range of motion.     Cervical back: Normal range of motion.  Lymphadenopathy:     Cervical: No cervical adenopathy.  Skin:    General: Skin is warm and dry.     Findings: No erythema or rash.  Neurological:     Mental Status: She is alert and oriented to person, place, and time.  Psychiatric:        Behavior: Behavior normal.        Thought Content: Thought content normal.        Judgment: Judgment normal.      Lab Results   Component Value Date   WBC 7.4 09/03/2019   HGB 14.1 09/03/2019   HCT 42.7 09/03/2019   MCV 97.9 09/03/2019   PLT 273 09/03/2019     Chemistry      Component Value Date/Time   NA 141 09/03/2019 1003   NA 142 03/04/2017 1016   K 3.9 09/03/2019 1003   CL 106 09/03/2019 1003   CO2 29 09/03/2019 1003   BUN 14 09/03/2019 1003   BUN 13 03/04/2017 1016   CREATININE 0.78 09/03/2019 1003   CREATININE 0.78 05/20/2015 1704      Component Value Date/Time   CALCIUM 10.6 (H) 09/03/2019 1003   ALKPHOS 62 09/03/2019 1003   AST 14 (L) 09/03/2019 1003   ALT 9 09/03/2019 1003   BILITOT 0.8 09/03/2019 1003      Impression and Plan: Ms. Hubby is a 79 year old white female.  Looks like she has superficial bladder cancer.  She had this partially resected.  She has now undergone intravesicular BCG treatment.  I am sure that she will respond nicely to the BCG infusions.  I know that Dr. Alyson Ingles is incredibly talented and he is doing a great job with her and given her the best therapy for right now.  I would like to see her back in 3 months.  I do not see need for any scans right now.  If, for some reason, she has invasive bladder cancer, then we will have to readdress our approach and think about some form of systemic therapy for her.  Volanda Napoleon, MD 1/7/202111:11 AM

## 2019-09-03 NOTE — Telephone Encounter (Signed)
Appointments scheduled calendar printed per 1/7 los °

## 2019-09-07 DIAGNOSIS — H35033 Hypertensive retinopathy, bilateral: Secondary | ICD-10-CM | POA: Diagnosis not present

## 2019-09-07 DIAGNOSIS — H35372 Puckering of macula, left eye: Secondary | ICD-10-CM | POA: Diagnosis not present

## 2019-09-07 DIAGNOSIS — H18593 Other hereditary corneal dystrophies, bilateral: Secondary | ICD-10-CM | POA: Diagnosis not present

## 2019-09-07 DIAGNOSIS — H40013 Open angle with borderline findings, low risk, bilateral: Secondary | ICD-10-CM | POA: Diagnosis not present

## 2019-09-07 LAB — HM DIABETES EYE EXAM

## 2019-09-16 ENCOUNTER — Encounter: Payer: Self-pay | Admitting: Family Medicine

## 2019-09-16 ENCOUNTER — Ambulatory Visit (INDEPENDENT_AMBULATORY_CARE_PROVIDER_SITE_OTHER): Payer: Medicare PPO | Admitting: Family Medicine

## 2019-09-16 ENCOUNTER — Other Ambulatory Visit: Payer: Self-pay

## 2019-09-16 ENCOUNTER — Encounter: Payer: Medicare Other | Admitting: Family Medicine

## 2019-09-16 VITALS — BP 138/71 | HR 72 | Temp 97.8°F | Resp 16 | Ht 65.25 in | Wt 172.4 lb

## 2019-09-16 DIAGNOSIS — R1011 Right upper quadrant pain: Secondary | ICD-10-CM

## 2019-09-16 DIAGNOSIS — F418 Other specified anxiety disorders: Secondary | ICD-10-CM | POA: Diagnosis not present

## 2019-09-16 DIAGNOSIS — Z0001 Encounter for general adult medical examination with abnormal findings: Secondary | ICD-10-CM | POA: Diagnosis not present

## 2019-09-16 DIAGNOSIS — C670 Malignant neoplasm of trigone of bladder: Secondary | ICD-10-CM | POA: Diagnosis not present

## 2019-09-16 DIAGNOSIS — I10 Essential (primary) hypertension: Secondary | ICD-10-CM | POA: Diagnosis not present

## 2019-09-16 DIAGNOSIS — R7309 Other abnormal glucose: Secondary | ICD-10-CM | POA: Diagnosis not present

## 2019-09-16 DIAGNOSIS — M8580 Other specified disorders of bone density and structure, unspecified site: Secondary | ICD-10-CM

## 2019-09-16 DIAGNOSIS — E663 Overweight: Secondary | ICD-10-CM

## 2019-09-16 DIAGNOSIS — E559 Vitamin D deficiency, unspecified: Secondary | ICD-10-CM

## 2019-09-16 DIAGNOSIS — Z1231 Encounter for screening mammogram for malignant neoplasm of breast: Secondary | ICD-10-CM

## 2019-09-16 LAB — COMPREHENSIVE METABOLIC PANEL
ALT: 9 U/L (ref 0–35)
AST: 16 U/L (ref 0–37)
Albumin: 3.9 g/dL (ref 3.5–5.2)
Alkaline Phosphatase: 69 U/L (ref 39–117)
BUN: 13 mg/dL (ref 6–23)
CO2: 29 mEq/L (ref 19–32)
Calcium: 10.4 mg/dL (ref 8.4–10.5)
Chloride: 107 mEq/L (ref 96–112)
Creatinine, Ser: 0.73 mg/dL (ref 0.40–1.20)
GFR: 77.16 mL/min (ref >60.00)
Glucose, Bld: 109 mg/dL — ABNORMAL HIGH (ref 70–99)
Potassium: 4.2 mEq/L (ref 3.5–5.1)
Sodium: 141 mEq/L (ref 135–145)
Total Bilirubin: 1 mg/dL (ref 0.2–1.2)
Total Protein: 6.2 g/dL (ref 6.0–8.3)

## 2019-09-16 LAB — LIPID PANEL
Cholesterol: 184 mg/dL (ref 0–200)
HDL: 61.3 mg/dL (ref >39.00)
LDL Cholesterol: 110 mg/dL — ABNORMAL HIGH (ref 0–99)
NonHDL: 123.07
Total CHOL/HDL Ratio: 3
Triglycerides: 63 mg/dL (ref 0.0–149.0)
VLDL: 12.6 mg/dL (ref 0.0–40.0)

## 2019-09-16 LAB — VITAMIN D 25 HYDROXY (VIT D DEFICIENCY, FRACTURES): VITD: 32 ng/mL (ref 30.00–100.00)

## 2019-09-16 LAB — HEMOGLOBIN A1C: Hgb A1c MFr Bld: 5.7 % (ref 4.6–6.5)

## 2019-09-16 LAB — TSH: TSH: 2.05 u[IU]/mL (ref 0.35–4.50)

## 2019-09-16 MED ORDER — ESCITALOPRAM OXALATE 5 MG PO TABS: 5.0000 mg | ORAL_TABLET | Freq: Every day | ORAL | 5 refills | Status: DC

## 2019-09-16 MED ORDER — LOSARTAN POTASSIUM 50 MG PO TABS: 75.0000 mg | ORAL_TABLET | Freq: Every day | ORAL | 1 refills | Status: DC

## 2019-09-16 MED ORDER — LORAZEPAM 0.5 MG PO TABS: 0.5000 mg | ORAL_TABLET | Freq: Two times a day (BID) | ORAL | 5 refills | Status: DC | PRN

## 2019-09-16 NOTE — Progress Notes (Signed)
This visit occurred during the SARS-CoV-2 public health emergency.  Safety protocols were in place, including screening questions prior to the visit, additional usage of staff PPE, and extensive cleaning of exam room while observing appropriate contact time as indicated for disinfecting solutions.    Patient ID: Christina [name], female  DOB: Sep 17, 1940, 79 y.o.   MRN: [id number] Patient Care Team    Relationship Specialty Notifications Start End  Ma Hillock, DO PCP - General Family Medicine  09/02/17   Rolm Bookbinder, MD Consulting Physician General Surgery  10/05/16   Monna Fam, MD Consulting Physician Ophthalmology  10/05/16   Garry Heater, [place] Physician Dentistry  10/05/16   Juanita Craver, MD Consulting Physician Gastroenterology  09/02/17   Cordelia Poche, RN Oncology Nurse Navigator  Admissions 05/14/19   Volanda Napoleon, MD Medical Oncologist Oncology  05/14/19     Chief Complaint  Patient presents with   Annual Exam    Fasting. Mammogram 09/08/2018. Had blood work last week at oncology.     Subjective:  Christina [name] is a 79 y.o.  Female  present for CPE. All past medical history, surgical history, allergies, family history, immunizations, medications and social history were updated in the electronic medical record today. All recent labs, ED visits and hospitalizations within the last year were reviewed.  Situational anxiety:  Pt presents for an OV with complaints of increased anxiety secondary to new diagnosis of bladder cancer.    She has been using the Ativan twice daily as needed and feels it is helpful.  However she feels she has more of a constant anxiety now and is agreeable to try an everyday medicine.    Hypertension/overweight/HLD: Pt reportscompliancewith losartan 50 mg QD. Blood pressures ranges at home are not routinely checked. Patient denies chest pain, shortness of breath, dizziness or lower extremity edema. .  Pt does not take a  daily baby ASA. Pt is not prescribed statin. She does take fish oil supplement. Mild hyperlipidemia 2015. Reviewed cbc and CMP from heme-onc.  TSH: 02/2018 WNL A1c: 5.7 02/2018 Lipid: 09/04/2017 total cholesterol 183, HDL 64, LDL 103, triglycerides 81 Diet: low sodium Exercise: routine RF: HTN, mild HLD, BMI > 25 former smoker  Health maintenance:  Colonoscopy: > 75 n/a Mammogram: Completed 08/2018 within normal limits. Cervical cancer screening: > 65, N/A Immunizations: Tetanus UTD 09/28/2011, declines flu shot.  pneumonia series completed.  Declined Shingrix. Infectious disease screening: N/A  DEXA: 08/2018 -2.2  Assistive device: none Oxygen MEQ:ASTM Patient has a Dental home. Hospitalizations/ED visits: reviewed  Right upper quadrant discomfort: Today patient reports some right upper quadrant discomfort.  Pain is intermittent.  Can be after meals.  She states has occurred off and on over the last few months.  She did have an attack last night.  She has some mild nausea with occurrence.  She denies fever, vomiting or diarrhea.  Depression screen [facility] 2/9 09/16/2019 06/05/2018 03/06/2018 12/17/2017 09/02/2017  Decreased Interest 0 0 0 0 0  Down, Depressed, Hopeless 1 0 0 0 0  PHQ - 2 Score 1 0 0 0 0  Altered sleeping - - - - -  Tired, decreased energy - - - - -  Change in appetite - - - - -  Feeling bad or failure about yourself  - - - - -  Trouble concentrating - - - - -  Moving slowly or fidgety/restless - - - - -  Suicidal thoughts - - - - -  PHQ-9 Score - - - - -   GAD 7 : Generalized Anxiety Score 09/16/2019  Nervous, Anxious, on Edge 0  Control/stop worrying 3  Worry too much - different things 3  Trouble relaxing 0  Restless 3  Easily annoyed or irritable 0  Afraid - awful might happen 0  Total GAD 7 Score 9  Anxiety Difficulty Not difficult at all   Immunization History  Administered Date(s) Administered   Influenza Whole 06/07/2009, 05/28/2011, 05/28/2012    Influenza, High Dose Seasonal PF 05/20/2015   Influenza,inj,Quad PF,6+ Mos 05/26/2013, 05/31/2014   Pneumococcal Conjugate-13 05/31/2014   Pneumococcal Polysaccharide-23 06/07/2009   Td 01/31/2003   Tdap 09/28/2011    Past Medical History:  Diagnosis Date   Arthritis    Asthma    a little?, no problems in several years   Bladder tumor    Cervical cancer screening 01/31/2012   Chicken pox as a child   Degenerative tear of acetabular labrum of left hip 02/26/2019   Depression with anxiety 06/07/2009   Qualifier: Diagnosis of  By: [name] MD, Catherine     Dermatitis of external ear 07/11/2012   Diverticulosis    Dysphagia 01/31/2012   Hiatal hernia with gastroesophageal reflux 10/26/2010   Qualifier: Diagnosis of  By: [name] MD, Catherine     Hyperglycemia 06/21/2013   Hyperlipidemia, mixed 10/16/2015   pt unaware   Hypertension    Left hip pain 07/10/2015   Lesion of breast 12/03/2016   benign; resolved   Low back pain 05/29/2015   Measles as a child   Mumps as a child   Neck pain on left side 06/21/2013   Osteopenia 01/03/2017   Overweight (BMI 25.0-29.9) 10/07/2008   Qualifier: Diagnosis of  By: [name] MD, [name]     Palpitations    several years ago, not currently   Pre-diabetes    Psoriasis    ears   RUQ pain 05/29/2015   Spider veins    Bilateral legs   Spinal stenosis    Umbilical hernia    Urinary frequency 09/28/2011   Vertigo    Wears dentures    Upper,   Allergies  Allergen Reactions   Nutrasweet Aspartame [Aspartame] Diarrhea   Lisinopril Cough   Past Surgical History:  Procedure Laterality Date   ABDOMINAL SURGERY  1970's   BREAST BIOPSY Right 1980   BREAST EXCISIONAL BIOPSY   BREAST BIOPSY Left 1970   BREAST EXCISIONAL BIOPSY   BREAST CYST ASPIRATION     CATARACT EXTRACTION, BILATERAL     COLONOSCOPY     CYSTECTOMY     abdomen   CYSTOSCOPY W/ RETROGRADES Bilateral 04/27/2019   Procedure:  CYSTOSCOPY WITH RETROGRADE PYELOGRAM;  Surgeon: Cleon Gustin, MD;  [facility];  Service: Urology;  Laterality: Bilateral;   EYE SURGERY Bilateral    cataract   INSERTION OF MESH N/A 06/11/2017   Procedure: INSERTION OF MESH;  Surgeon: Rolm Bookbinder, MD;  Location: [place];  Service: General;  Laterality: N/A;  BILATERAL TAP BLOCK   TRANSURETHRAL RESECTION OF BLADDER TUMOR N/A 04/27/2019   Procedure: TRANSURETHRAL RESECTION OF BLADDER TUMOR (TURBT);  Surgeon: Cleon Gustin, MD;  Location: [facility];  Service: Urology;  Laterality: N/A;  1 HR   TRANSURETHRAL RESECTION OF BLADDER TUMOR N/A 06/01/2019   Procedure: TRANSURETHRAL RESECTION OF BLADDER TUMOR (TURBT);  Surgeon: Cleon Gustin, MD;  Location: [facility];  Service: Urology;  Laterality: N/A;  1 HR  TUBAL LIGATION  1970   VENTRAL HERNIA REPAIR N/A 06/11/2017   Procedure: LAPAROSCOPIC VENTRAL HERNIA REPAIR WITH MESH ERAS PATHWAY;  Surgeon: Rolm Bookbinder, MD;  Location: [place];  Service: General;  Laterality: N/A;  BILATERAL TAP BLOCK   Family History  Problem Relation Age of Onset   Hypertension Mother    Anxiety disorder Mother    Ovarian cancer Mother 23       lung- smoker, ovarian   Lung cancer Mother        smoker   Arthritis Mother    Hyperlipidemia Mother    Alcohol abuse Father    Diabetes Sister    Hypertension Sister    Ovarian cancer Sister    Arthritis Sister    Depression Sister    Hyperlipidemia Sister    Other Maternal Grandmother        pacemaker   Multiple sclerosis Maternal Grandfather        ?   Arthritis Brother    COPD Brother    Heart attack Brother    Hyperlipidemia Brother    Stroke Brother    Asthma Sister    Depression Sister    Hyperlipidemia Sister    Asthma Sister    Depression Sister    Asthma Paternal Uncle    Social History   Social History Narrative   Retired. Lives  alone.    Attended some business college.    Former smoker.    Smoke alarm in the home. Wears seat balt.    Wears dentures.    Feels safe in her relationships.        Allergies as of 09/16/2019      Reactions   Nutrasweet Aspartame [aspartame] Diarrhea   Lisinopril Cough      Medication List       Accurate as of September 16, 2019 11:59 PM. If you have any questions, ask your nurse or doctor.        STOP taking these medications   Hyoscyamine Sulfate SL 0.125 MG Subl Commonly known as: Levsin/SL Stopped by: Howard Pouch, DO   MYRBETRIQ PO Stopped by: Howard Pouch, DO     TAKE these medications   escitalopram 5 MG tablet Commonly known as: Lexapro Take 1 tablet (5 mg total) by mouth daily. Started by: Howard Pouch, DO   LORazepam 0.5 MG tablet Commonly known as: ATIVAN Take 1 tablet (0.5 mg total) by mouth 2 (two) times daily as needed for anxiety.   losartan 50 MG tablet Commonly known as: COZAAR Take 1.5 tablets (75 mg total) by mouth daily. What changed: how much to take Changed by: Howard Pouch, DO   meclizine 25 MG tablet Commonly known as: ANTIVERT Take 1 tablet (25 mg total) by mouth 3 (three) times daily as needed for dizziness.   triamcinolone cream 0.1 % Commonly known as: KENALOG Apply topically 2 (two) times daily. What changed:   how much to take  when to take this  reasons to take this      All past medical history, surgical history, allergies, family history, immunizations andmedications were updated in the EMR today and reviewed under the history and medication portions of their EMR.     No results found.  ROS: 14 pt review of systems performed and negative (unless mentioned in an HPI)  Objective: BP 138/71 (BP Location: Left Arm, Patient Position: Sitting, Cuff Size: Normal)    Pulse 72    Temp 97.8 F (36.6 C) (Temporal)  Resp 16    Ht 5' 5.25" (1.657 m)    Wt 172 lb 6 oz (78.2 kg)    SpO2 97%    BMI 28.47 kg/m  Gen: Afebrile.  No acute distress. Nontoxic in appearance, well-developed, well-nourished, pleasant, Caucasian female. HENT: AT. [place]. Bilateral TM visualized and normal in appearance, normal external auditory canal. MMM, no oral lesions, adequate dentition. Bilateral nares within normal limits. Throat without erythema, ulcerations or exudates.  No cough on exam, no hoarseness on exam. Eyes:Pupils Equal Round Reactive to light, Extraocular movements intact,  Conjunctiva without redness, discharge or icterus. Neck/lymp/endocrine: Supple, no lymphadenopathy, no thyromegaly CV: RRR no murmur, no edema, +2/4 P posterior tibialis pulses.  Chest: CTAB, no wheeze, rhonchi or crackles.  Normal respiratory effort.  Good air movement. Abd: Soft.  Flat. NTND. BS present.  No masses palpated. No hepatosplenomegaly. No rebound tenderness or guarding. Skin: No rashes, purpura or petechiae. Warm and well-perfused. Skin intact. Neuro/Msk:  Normal gait. PERLA. EOMi. Alert. Oriented x3.  Cranial nerves II through XII intact. Muscle strength 5/5 upper/lower extremity. DTRs equal bilaterally. Psych: Normal affect, dress and demeanor. Normal speech. Normal thought content and judgment.   No exam data present  Assessment/plan: Christina [name] is a 79 y.o. female present for CPE Essential hypertension, benign/Overweight (BMI 25.0-29.9) -Above goal. - increase losartan 50 to 75 mg QD.   -Lipid panel and TSH collected today.  - f/U 6 months.  Malignant neoplasm of trigone of urinary bladder (HCC) Under the care of urology. Reviewed CBC completed by oncology. Elevated hemoglobin A1c - Hemoglobin A1c Situational anxiety Patient continues to have increased anxiety.  Discussed options with her again today. she is agreeable to start Lexapro 5 mg daily. Ativan twice daily as needed refilled for her.  [place] controlled substance database reviewed today. Up every 6 months-sooner if needed. Vitamin D  deficiency/Hypercalcemia/osteopenia - Vitamin D (25 hydroxy) RUQ pain Discussed different possible etiologies with her.  Possibly could be related to GERD/gastritis versus gallbladder etiology.  She is not in any current pain and her exam is normal. - Comp Met (CMET) - f/u PRN-encouraged her to follow-up at that time of discomfort if possible. Encounter for screening mammogram for malignant neoplasm of breast - MM 3D SCREEN BREAST BILATERAL; Future Encounter for general adult medical examination with abnormal findings Patient was encouraged to exercise greater than 150 minutes a week. Patient was encouraged to choose a diet filled with fresh fruits and vegetables, and lean meats. AVS provided to patient today for education/recommendation on gender specific health and safety maintenance. Return in about 1 year (around 09/15/2020) for CPE (30 min) and 6 mos CMC.  Orders Placed This Encounter  Procedures   MM 3D SCREEN BREAST BILATERAL   Hemoglobin A1c   Lipid panel   TSH   Vitamin D (25 hydroxy)   Comp Met (CMET)   Meds ordered this encounter  Medications   losartan (COZAAR) 50 MG tablet    Sig: Take 1.5 tablets (75 mg total) by mouth daily.    Dispense:  135 tablet    Refill:  1    DC scripts from all prior providers for this med   escitalopram (LEXAPRO) 5 MG tablet    Sig: Take 1 tablet (5 mg total) by mouth daily.    Dispense:  30 tablet    Refill:  5   LORazepam (ATIVAN) 0.5 MG tablet    Sig: Take 1 tablet (0.5 mg total) by mouth 2 (two)  times daily as needed for anxiety.    Dispense:  60 tablet    Refill:  5      Electronically signed by: Howard Pouch, [place]

## 2019-09-16 NOTE — Patient Instructions (Addendum)
Increase your losartan to 1.5 tabs.  Start lexapro 5 mg daily-this helps with depression and anxiety, but it needs to be taken daily.  I will refill your ativan also- but only take if needed after 2 weeks.    Health Maintenance After Age 78 After age 48, you are at a higher risk for certain long-term diseases and infections as well as injuries from falls. Falls are a major cause of broken bones and head injuries in people who are older than age 107. Getting regular preventive care can help to keep you healthy and well. Preventive care includes getting regular testing and making lifestyle changes as recommended by your health care provider. Talk with your health care provider about:  Which screenings and tests you should have. A screening is a test that checks for a disease when you have no symptoms.  A diet and exercise plan that is right for you. What should I know about screenings and tests to prevent falls? Screening and testing are the best ways to find a health problem early. Early diagnosis and treatment give you the best chance of managing medical conditions that are common after age 41. Certain conditions and lifestyle choices may make you more likely to have a fall. Your health care provider may recommend:  Regular vision checks. Poor vision and conditions such as cataracts can make you more likely to have a fall. If you wear glasses, make sure to get your prescription updated if your vision changes.  Medicine review. Work with your health care provider to regularly review all of the medicines you are taking, including over-the-counter medicines. Ask your health care provider about any side effects that may make you more likely to have a fall. Tell your health care provider if any medicines that you take make you feel dizzy or sleepy.  Osteoporosis screening. Osteoporosis is a condition that causes the bones to get weaker. This can make the bones weak and cause them to break more  easily.  Blood pressure screening. Blood pressure changes and medicines to control blood pressure can make you feel dizzy.  Strength and balance checks. Your health care provider may recommend certain tests to check your strength and balance while standing, walking, or changing positions.  Foot health exam. Foot pain and numbness, as well as not wearing proper footwear, can make you more likely to have a fall.  Depression screening. You may be more likely to have a fall if you have a fear of falling, feel emotionally low, or feel unable to do activities that you used to do.  Alcohol use screening. Using too much alcohol can affect your balance and may make you more likely to have a fall. What actions can I take to lower my risk of falls? General instructions  Talk with your health care provider about your risks for falling. Tell your health care provider if: ? You fall. Be sure to tell your health care provider about all falls, even ones that seem minor. ? You feel dizzy, sleepy, or off-balance.  Take over-the-counter and prescription medicines only as told by your health care provider. These include any supplements.  Eat a healthy diet and maintain a healthy weight. A healthy diet includes low-fat dairy products, low-fat (lean) meats, and fiber from whole grains, beans, and lots of fruits and vegetables. Home safety  Remove any tripping hazards, such as rugs, cords, and clutter.  Install safety equipment such as grab bars in bathrooms and safety rails on stairs.  Keep  rooms and walkways well-lit. Activity   Follow a regular exercise program to stay fit. This will help you maintain your balance. Ask your health care provider what types of exercise are appropriate for you.  If you need a cane or walker, use it as recommended by your health care provider.  Wear supportive shoes that have nonskid soles. Lifestyle  Do not drink alcohol if your health care provider tells you not to  drink.  If you drink alcohol, limit how much you have: ? 0-1 drink a day for women. ? 0-2 drinks a day for men.  Be aware of how much alcohol is in your drink. In the [place] one drink equals one typical bottle of beer (12 oz), one-half glass of wine (5 oz), or one shot of hard liquor (1 oz).  Do not use any products that contain nicotine or tobacco, such as cigarettes and e-cigarettes. If you need help quitting, ask your health care provider. Summary  Having a healthy lifestyle and getting preventive care can help to protect your health and wellness after age 65.  Screening and testing are the best way to find a health problem early and help you avoid having a fall. Early diagnosis and treatment give you the best chance for managing medical conditions that are more common for people who are older than age 4.  Falls are a major cause of broken bones and head injuries in people who are older than age 65. Take precautions to prevent a fall at home.  Work with your health care provider to learn what changes you can make to improve your health and wellness and to prevent falls. This information is not intended to replace advice given to you by your health care provider. Make sure you discuss any questions you have with your health care provider. Document Revised: 12/04/2018 Document Reviewed: 06/26/2017 Elsevier Patient Education  [employer].   We are committed to keeping you informed about the COVID-19 vaccine.  As the vaccine continues to become available for each phase, we will ensure that patients who meet the criteria receive the information they need to access vaccination opportunities. Continue to check your MyChart account and [email or web address] for updates. Please review the Phase 1b information below.  Following [place]'s guidelines for the distribution of COVID-19 vaccines we are pleased to share our plans to begin offering vaccines to those 65 and older (Phase  1b). Here are details of those plans:  [place] On Tuesday, Jan. 19, the [facility]) and [place] begin large-scale COVID-19 vaccinations at the [place]. The vaccinations are appointment only and for those 37 and older.  Walk-ins will not be accepted.  All appointments are currently filled. Please join our waiting list for the next available appointments. We will contact you when appointments become available. Please do not sign up more than once.  Join Our Waiting List   Other Vaccination Opportunities in [place] We are also working in partnership with county health agencies in our service counties to ensure continuing vaccination availability in the weeks and months ahead. Learn more about each county's vaccination efforts in the website links below:   [place]'s phase 1b vaccination guidelines, prioritizing those 65 and over as the next eligible group to receive the COVID-19 vaccine, are detailed at [email or web address].[email or web address].   Vaccine Safety and Effectiveness Clinical  trials for the [place] COVID-19 vaccine involved 42,000 people and showed that the vaccine is more than 95% effective in preventing COVID-19 with no serious safety concerns. Similar results have been reported for the Moderna COVID-19 vaccine. Side effects reported in the [place] clinical trials include a sore arm at the injection site, fatigue, headache, chills and fever. While side effects from the [place] COVID-19 vaccine are higher than for a typical flu vaccine, they are lower in many ways than side effects from the leading vaccine to prevent shingles. Side effects are signs that a vaccine is working and are related to your immune system being stimulated to produce antibodies against infection. Side effects from vaccination are far less  significant than health impacts from COVID-19.  Staying Informed Pharmacists, infectious disease doctors, critical care nurses and other experts at [facility] continue to speak publicly through media interviews and direct communication with our patients and communities about the safety, effectiveness and importance of vaccines to eliminate COVID-19. In addition, reliable information on vaccine safety, effectiveness, side effects and more is available on the following websites:  [place] Department of Health and Human Services COVID-19 Vaccine Information Website.  U.S. Centers for Disease Control and Prevention [id number] Human resources officer.  Staying Safe We agree with the CDC on what we can do to help our communities get back to normal: Getting "back to normal" is going to take all of our tools. If we use all the tools we have, we stand the best chance of getting our families, communities, schools and workplaces "back to normal" sooner:  Get vaccinated as soon as vaccines become available within the phase of the state's vaccination rollout plan for which you meet the eligibility criteria.  Wear a mask.  Stay 6 feet from others and avoid crowds.  Wash hands often.  For our most current information, please visit [email or web address].

## 2019-10-01 DIAGNOSIS — C672 Malignant neoplasm of lateral wall of bladder: Secondary | ICD-10-CM | POA: Diagnosis not present

## 2019-10-05 ENCOUNTER — Other Ambulatory Visit: Payer: Self-pay | Admitting: Urology

## 2019-10-07 ENCOUNTER — Other Ambulatory Visit: Payer: Self-pay | Admitting: Urology

## 2019-10-07 ENCOUNTER — Other Ambulatory Visit: Payer: Self-pay

## 2019-10-07 ENCOUNTER — Ambulatory Visit: Payer: Medicare PPO | Admitting: Family Medicine

## 2019-10-07 ENCOUNTER — Encounter: Payer: Self-pay | Admitting: Family Medicine

## 2019-10-07 VITALS — BP 136/72 | HR 81 | Temp 98.0°F | Resp 16 | Ht 65.25 in | Wt 173.2 lb

## 2019-10-07 DIAGNOSIS — T148XXA Other injury of unspecified body region, initial encounter: Secondary | ICD-10-CM | POA: Diagnosis not present

## 2019-10-07 MED ORDER — CYCLOBENZAPRINE HCL 5 MG PO TABS: 5.0000 mg | ORAL_TABLET | Freq: Two times a day (BID) | ORAL | 1 refills | Status: DC | PRN

## 2019-10-07 NOTE — Progress Notes (Signed)
This visit occurred during the SARS-CoV-2 public health emergency.  Safety protocols were in place, including screening questions prior to the visit, additional usage of staff PPE, and extensive cleaning of exam room while observing appropriate contact time as indicated for disinfecting solutions.    Christina [name] , 17-Dec-1940, 79 y.o., female MRN: [id number] Patient Care Team    Relationship Specialty Notifications Start End  Ma Hillock, DO PCP - General Family Medicine  09/02/17   Rolm Bookbinder, MD Consulting Physician General Surgery  10/05/16   Monna Fam, MD Consulting Physician Ophthalmology  10/05/16   Garry Heater, [place] Physician Dentistry  10/05/16   Juanita Craver, MD Consulting Physician Gastroenterology  09/02/17   Cordelia Poche, RN Oncology Nurse Navigator  Admissions 05/14/19   Volanda Napoleon, MD Medical Oncologist Oncology  05/14/19     Chief Complaint  Patient presents with  . Shoulder Pain    Pt has bilateral shoulder and back pain. It goes straight across top of shoulder blades from one shoulder to the other. Dull ache and feels like a sore muscle. Started 3 days ago.      Subjective: Pt presents for an OV with complaints of bilateral upper back/shoulder pain that started 3 days ago.  She does not recall any enticing event or injury.  She reports she is always reaching up on a shelf in her kitchen when cooking and had done so the day prior.  She reports she woke up Sunday with pain across to her shoulders.  She states that her to use the muscles to sit up.  The next evening she woke up in the middle the night with discomfort across her shoulders..  She used a topical/muscle relief patch and Tylenol which helped ease some of her symptoms.  She reports it seems to be improving slightly, however she is still having a great deal of discomfort.  She reports pain is worse in her left upper back more than right.  Pain is made worsened by movement.  She  denies any radiation of pain down her extremities.  She has no known history of neck injury/shoulder injury or prior surgeries.  She does have a history of arthritis.  Depression screen [facility] 2/9 09/16/2019 06/05/2018 03/06/2018 12/17/2017 09/02/2017  Decreased Interest 0 0 0 0 0  Down, Depressed, Hopeless 1 0 0 0 0  PHQ - 2 Score 1 0 0 0 0  Altered sleeping - - - - -  Tired, decreased energy - - - - -  Change in appetite - - - - -  Feeling bad or failure about yourself  - - - - -  Trouble concentrating - - - - -  Moving slowly or fidgety/restless - - - - -  Suicidal thoughts - - - - -  PHQ-9 Score - - - - -    Allergies  Allergen Reactions  . Nutrasweet Aspartame [Aspartame] Diarrhea  . Lisinopril Cough   Social History   Social History Narrative   Retired. Lives alone.    Attended some business college.    Former smoker.    Smoke alarm in the home. Wears seat balt.    Wears dentures.    Feels safe in her relationships.       Past Medical History:  Diagnosis Date  . Arthritis   . Asthma    a little?, no problems in several years  . Bladder tumor   . Cervical cancer screening 01/31/2012  .  Chicken pox as a child  . Degenerative tear of acetabular labrum of left hip 02/26/2019  . Depression with anxiety 06/07/2009   Qualifier: Diagnosis of  By: [name] MD, [name]    . Dermatitis of external ear 07/11/2012  . Diverticulosis   . Dysphagia 01/31/2012  . Hiatal hernia with gastroesophageal reflux 10/26/2010   Qualifier: Diagnosis of  By: [name] MD, [name]    . Hyperglycemia 06/21/2013  . Hyperlipidemia, mixed 10/16/2015   pt unaware  . Hypertension   . Left hip pain 07/10/2015  . Lesion of breast 12/03/2016   benign; resolved  . Low back pain 05/29/2015  . Measles as a child  . Mumps as a child  . Neck pain on left side 06/21/2013  . Osteopenia 01/03/2017  . Overweight (BMI 25.0-29.9) 10/07/2008   Qualifier: Diagnosis of  By: [name] MD, [name]    . Palpitations     several years ago, not currently  . Pre-diabetes   . Psoriasis    ears  . RUQ pain 05/29/2015  . Spider veins    Bilateral legs  . Spinal stenosis   . Umbilical hernia   . Urinary frequency 09/28/2011  . Vertigo   . Wears dentures    Upper,   Past Surgical History:  Procedure Laterality Date  . ABDOMINAL SURGERY  1970's  . BREAST BIOPSY Right 1980   BREAST EXCISIONAL BIOPSY  . BREAST BIOPSY Left 1970   BREAST EXCISIONAL BIOPSY  . BREAST CYST ASPIRATION    . CATARACT EXTRACTION, BILATERAL    . COLONOSCOPY    . CYSTECTOMY     abdomen  . CYSTOSCOPY W/ RETROGRADES Bilateral 04/27/2019   Procedure: CYSTOSCOPY WITH RETROGRADE PYELOGRAM;  Surgeon: Cleon Gustin, MD;  [facility];  Service: Urology;  Laterality: Bilateral;  . EYE SURGERY Bilateral    cataract  . INSERTION OF MESH N/A 06/11/2017   Procedure: INSERTION OF MESH;  Surgeon: Rolm Bookbinder, MD;  Location: [place];  Service: General;  Laterality: N/A;  BILATERAL TAP BLOCK  . TRANSURETHRAL RESECTION OF BLADDER TUMOR N/A 04/27/2019   Procedure: TRANSURETHRAL RESECTION OF BLADDER TUMOR (TURBT);  Surgeon: Cleon Gustin, MD;  [facility];  Service: Urology;  Laterality: N/A;  1 HR  . TRANSURETHRAL RESECTION OF BLADDER TUMOR N/A 06/01/2019   Procedure: TRANSURETHRAL RESECTION OF BLADDER TUMOR (TURBT);  Surgeon: Cleon Gustin, MD;  Location: [facility];  Service: Urology;  Laterality: N/A;  1 HR  . TUBAL LIGATION  1970  . VENTRAL HERNIA REPAIR N/A 06/11/2017   Procedure: LAPAROSCOPIC VENTRAL HERNIA REPAIR WITH MESH ERAS PATHWAY;  Surgeon: Rolm Bookbinder, MD;  Location: [place];  Service: General;  Laterality: N/A;  BILATERAL TAP BLOCK   Family History  Problem Relation Age of Onset  . Hypertension Mother   . Anxiety disorder Mother   . Ovarian cancer Mother 38       lung- smoker, ovarian  . Lung cancer Mother        smoker  . Arthritis  Mother   . Hyperlipidemia Mother   . Alcohol abuse Father   . Diabetes Sister   . Hypertension Sister   . Ovarian cancer Sister   . Arthritis Sister   . Depression Sister   . Hyperlipidemia Sister   . Other Maternal Grandmother        pacemaker  . Multiple sclerosis Maternal Grandfather        ?  [name] Arthritis Brother   .  COPD Brother   . Heart attack Brother   . Hyperlipidemia Brother   . Stroke Brother   . Asthma Sister   . Depression Sister   . Hyperlipidemia Sister   . Asthma Sister   . Depression Sister   . Asthma Paternal Uncle    Allergies as of 10/07/2019      Reactions   Nutrasweet Aspartame [aspartame] Diarrhea   Lisinopril Cough      Medication List       Accurate as of October 07, 2019  9:50 AM. If you have any questions, ask your nurse or doctor.        escitalopram 5 MG tablet Commonly known as: Lexapro Take 1 tablet (5 mg total) by mouth daily.   LORazepam 0.5 MG tablet Commonly known as: ATIVAN Take 1 tablet (0.5 mg total) by mouth 2 (two) times daily as needed for anxiety.   losartan 50 MG tablet Commonly known as: COZAAR Take 1.5 tablets (75 mg total) by mouth daily.   meclizine 25 MG tablet Commonly known as: ANTIVERT Take 1 tablet (25 mg total) by mouth 3 (three) times daily as needed for dizziness.   triamcinolone cream 0.1 % Commonly known as: KENALOG Apply topically 2 (two) times daily. What changed:   how much to take  when to take this  reasons to take this       All past medical history, surgical history, allergies, family history, immunizations andmedications were updated in the EMR today and reviewed under the history and medication portions of their EMR.     ROS: Negative, with the exception of above mentioned in HPI   Objective:  BP 136/72 (BP Location: Left Arm, Patient Position: Sitting, Cuff Size: Normal)   Pulse 81   Temp 98 F (36.7 C) (Temporal)   Resp 16   Ht 5' 5.25" (1.657 m)   Wt 173 lb 4 oz (78.6  kg)   SpO2 97%   BMI 28.61 kg/m  Body mass index is 28.61 kg/m. Gen: Afebrile. No acute distress. Nontoxic in appearance, well developed, well nourished.  HENT: AT. [place].  Eyes:Pupils Equal Round Reactive to light, Extraocular movements intact,  Conjunctiva without redness, discharge or icterus. Neck/lymp/endocrine: Supple, no lymphadenopathy CV: RRR  Chest: CTAB, no wheeze or crackles. Good air movement, normal resp effort.  MSK (cervical/thoracic/shoulder): No erythema, no bony tenderness of cervical or thoracic spine.  No bony tenderness of bilateral shoulders.  Ropiness present bilateral paracervical muscle groups and bilateral upper trapezium tenderness to palpation greater on left.  Full range of motion of cervical spine with discomfort in left trap on left side bending and left rotation.  Full range of motion bilateral arms.  Are vastly intact distally. Skin: No rashes, purpura or petechiae.  Neuro:  Normal gait. PERLA. EOMi. Alert. Oriented x3  Psych: Normal affect, dress and demeanor. Normal speech. Normal thought content and judgment.  No exam data present No results found. No results found for this or any previous visit (from the past 24 hour(s)).  Assessment/Plan: AKAI SWEETING is a 79 y.o. female present for OV for  Muscle strain Exam is positive for left trapezium discomfort with range of motion, focal tenderness to palpation and ropiness.  Suspect she either strained herself versus tension from her increased and anxiety. Discussed options with her today.  Encouraged her to continue her Tylenol and topical muscle relief patches. Flexeril twice daily as needed prescribed.  Encouraged her to take 5-2 mg nightly and may use  1 additional time during the day if needed with caution on sedation side effects. Encouraged her to use a heating bed a few times a day.  Patient was instructed on appropriate use. Massage can be helpful. She is still holding onto the Lexapro  prescription and she has not started it yet.  I encouraged her if her tension/anxiety increases she should go ahead and start the medication.  She was assured this medication is well-tolerated and can be easily removed from her regimen if needed in the future. Follow-up as needed   Reviewed expectations re: course of current medical issues.  Discussed self-management of symptoms.  Outlined signs and symptoms indicating need for more acute intervention.  Patient verbalized understanding and all questions were answered.  Patient received an After-Visit Summary.    No orders of the defined types were placed in this encounter.  Meds ordered this encounter  Medications  . cyclobenzaprine (FLEXERIL) 5 MG tablet    Sig: Take 1-2 tablets (5-10 mg total) by mouth 2 (two) times daily as needed for muscle spasms.    Dispense:  60 tablet    Refill:  1      Note is dictated utilizing voice recognition software. Although note has been proof read prior to signing, occasional typographical errors still can be missed. If any questions arise, please do not hesitate to call for verification.   electronically signed by:  Howard Pouch, DO  [place]

## 2019-10-07 NOTE — Patient Instructions (Signed)
Your symptoms appear to be from muscle strain.  Start the cyclobenzaprine 1-2 tabs before bed, you  can take an additional tab in the afternoon if needed and not driving etc.   You can take tylenol and use heat.  Muscle patches are ok as well.  Massage is good too.     Neck Exercises Ask your health care provider which exercises are safe for you. Do exercises exactly as told by your health care provider and adjust them as directed. It is normal to feel mild stretching, pulling, tightness, or discomfort as you do these exercises. Stop right away if you feel sudden pain or your pain gets worse. Do not begin these exercises until told by your health care provider. Neck exercises can be important for many reasons. They can improve strength and maintain flexibility in your neck, which will help your upper back and prevent neck pain. Stretching exercises Rotation neck stretching  1. Sit in a chair or stand up. 2. Place your feet flat on the floor, shoulder width apart. 3. Slowly turn your head (rotate) to the right until a slight stretch is felt. Turn it all the way to the right so you can look over your right shoulder. Do not tilt or tip your head. 4. Hold this position for 10-30 seconds. 5. Slowly turn your head (rotate) to the left until a slight stretch is felt. Turn it all the way to the left so you can look over your left shoulder. Do not tilt or tip your head. 6. Hold this position for 10-30 seconds. Repeat __________ times. Complete this exercise __________ times a day. Neck retraction 1. Sit in a sturdy chair or stand up. 2. Look straight ahead. Do not bend your neck. 3. Use your fingers to push your chin backward (retraction). Do not bend your neck for this movement. Continue to face straight ahead. If you are doing the exercise properly, you will feel a slight sensation in your throat and a stretch at the back of your neck. 4. Hold the stretch for 1-2 seconds. Repeat __________ times.  Complete this exercise __________ times a day. Strengthening exercises Neck press 1. Lie on your back on a firm bed or on the floor with a pillow under your head. 2. Use your neck muscles to push your head down on the pillow and straighten your spine. 3. Hold the position as well as you can. Keep your head facing up (in a neutral position) and your chin tucked. 4. Slowly count to 5 while holding this position. Repeat __________ times. Complete this exercise __________ times a day. Isometrics These are exercises in which you strengthen the muscles in your neck while keeping your neck still (isometrics). 1. Sit in a supportive chair and place your hand on your forehead. 2. Keep your head and face facing straight ahead. Do not flex or extend your neck while doing isometrics. 3. Push forward with your head and neck while pushing back with your hand. Hold for 10 seconds. 4. Do the sequence again, this time putting your hand against the back of your head. Use your head and neck to push backward against the hand pressure. 5. Finally, do the same exercise on either side of your head, pushing sideways against the pressure of your hand. Repeat __________ times. Complete this exercise __________ times a day. Prone head lifts 1. Lie face-down (prone position), resting on your elbows so that your chest and upper back are raised. 2. Start with your head facing  downward, near your chest. Position your chin either on or near your chest. 3. Slowly lift your head upward. Lift until you are looking straight ahead. Then continue lifting your head as far back as you can comfortably stretch. 4. Hold your head up for 5 seconds. Then slowly lower it to your starting position. Repeat __________ times. Complete this exercise __________ times a day. Supine head lifts 1. Lie on your back (supine position), bending your knees to point to the ceiling and keeping your feet flat on the floor. 2. Lift your head slowly off  the floor, raising your chin toward your chest. 3. Hold for 5 seconds. Repeat __________ times. Complete this exercise __________ times a day. Scapular retraction 1. Stand with your arms at your sides. Look straight ahead. 2. Slowly pull both shoulders (scapulae) backward and downward (retraction) until you feel a stretch between your shoulder blades in your upper back. 3. Hold for 10-30 seconds. 4. Relax and repeat. Repeat __________ times. Complete this exercise __________ times a day. Contact a health care provider if:  Your neck pain or discomfort gets much worse when you do an exercise.  Your neck pain or discomfort does not improve within 2 hours after you exercise. If you have any of these problems, stop exercising right away. Do not do the exercises again unless your health care provider says that you can. Get help right away if:  You develop sudden, severe neck pain. If this happens, stop exercising right away. Do not do the exercises again unless your health care provider says that you can. This information is not intended to replace advice given to you by your health care provider. Make sure you discuss any questions you have with your health care provider. Document Revised: 06/11/2018 Document Reviewed: 06/11/2018 Elsevier Patient Education  [place].

## 2019-10-08 ENCOUNTER — Encounter: Payer: Self-pay | Admitting: *Deleted

## 2019-10-14 DIAGNOSIS — M5416 Radiculopathy, lumbar region: Secondary | ICD-10-CM | POA: Diagnosis not present

## 2019-10-15 ENCOUNTER — Encounter (HOSPITAL_BASED_OUTPATIENT_CLINIC_OR_DEPARTMENT_OTHER): Payer: Self-pay | Admitting: Urology

## 2019-10-15 ENCOUNTER — Other Ambulatory Visit: Payer: Self-pay

## 2019-10-15 NOTE — Progress Notes (Signed)
Spoke w/ via phone for pre-op interview---Christina [name] needs dos----       I stat 8, ekg      COVID test ------2-2 am 10-22-2019 NPO after ------midnight Medications to take morning of surgery -----alprazolam prn Diabetic medication -----n/a Patient Special Instructions ----- Pre-Op special Istructions ----- Patient verbalized understanding of instructions that were given at this phone interview. Patient denies shortness of breath, chest pain, fever, cough a this phone interview.

## 2019-10-19 ENCOUNTER — Other Ambulatory Visit (HOSPITAL_COMMUNITY)
Admission: RE | Admit: 2019-10-19 | Discharge: 2019-10-19 | Disposition: A | Discharge status: Home / Self Care | Payer: Medicare PPO | Source: Ambulatory Visit | Attending: Urology | Admitting: Urology

## 2019-10-19 DIAGNOSIS — Z01812 Encounter for preprocedural laboratory examination: Secondary | ICD-10-CM | POA: Insufficient documentation

## 2019-10-19 DIAGNOSIS — Z20822 Contact with and (suspected) exposure to covid-19: Secondary | ICD-10-CM | POA: Insufficient documentation

## 2019-10-19 LAB — SARS CORONAVIRUS 2 (TAT 6-24 HRS): SARS Coronavirus 2: NEGATIVE

## 2019-10-22 ENCOUNTER — Ambulatory Visit (HOSPITAL_BASED_OUTPATIENT_CLINIC_OR_DEPARTMENT_OTHER)
Admission: RE | Admit: 2019-10-22 | Discharge: 2019-10-22 | Disposition: A | Discharge status: Home / Self Care | Payer: Medicare PPO | Attending: Urology | Admitting: Urology

## 2019-10-22 ENCOUNTER — Encounter (HOSPITAL_BASED_OUTPATIENT_CLINIC_OR_DEPARTMENT_OTHER)
Admission: RE | Disposition: A | Discharge status: Home / Self Care | Payer: Self-pay | Source: Home / Self Care | Attending: Urology

## 2019-10-22 ENCOUNTER — Ambulatory Visit (HOSPITAL_BASED_OUTPATIENT_CLINIC_OR_DEPARTMENT_OTHER): Payer: Medicare PPO | Admitting: Anesthesiology

## 2019-10-22 ENCOUNTER — Encounter (HOSPITAL_BASED_OUTPATIENT_CLINIC_OR_DEPARTMENT_OTHER): Payer: Self-pay | Admitting: Urology

## 2019-10-22 DIAGNOSIS — C679 Malignant neoplasm of bladder, unspecified: Secondary | ICD-10-CM | POA: Diagnosis not present

## 2019-10-22 DIAGNOSIS — I10 Essential (primary) hypertension: Secondary | ICD-10-CM | POA: Insufficient documentation

## 2019-10-22 DIAGNOSIS — Z87891 Personal history of nicotine dependence: Secondary | ICD-10-CM | POA: Insufficient documentation

## 2019-10-22 DIAGNOSIS — J45909 Unspecified asthma, uncomplicated: Secondary | ICD-10-CM | POA: Diagnosis not present

## 2019-10-22 DIAGNOSIS — C674 Malignant neoplasm of posterior wall of bladder: Secondary | ICD-10-CM | POA: Insufficient documentation

## 2019-10-22 DIAGNOSIS — N3281 Overactive bladder: Secondary | ICD-10-CM | POA: Diagnosis not present

## 2019-10-22 DIAGNOSIS — D494 Neoplasm of unspecified behavior of bladder: Secondary | ICD-10-CM | POA: Diagnosis not present

## 2019-10-22 DIAGNOSIS — Z8249 Family history of ischemic heart disease and other diseases of the circulatory system: Secondary | ICD-10-CM | POA: Diagnosis not present

## 2019-10-22 DIAGNOSIS — D090 Carcinoma in situ of bladder: Secondary | ICD-10-CM | POA: Diagnosis not present

## 2019-10-22 DIAGNOSIS — Z888 Allergy status to other drugs, medicaments and biological substances status: Secondary | ICD-10-CM | POA: Diagnosis not present

## 2019-10-22 DIAGNOSIS — Z9102 Food additives allergy status: Secondary | ICD-10-CM | POA: Insufficient documentation

## 2019-10-22 DIAGNOSIS — M1990 Unspecified osteoarthritis, unspecified site: Secondary | ICD-10-CM | POA: Diagnosis not present

## 2019-10-22 DIAGNOSIS — M8580 Other specified disorders of bone density and structure, unspecified site: Secondary | ICD-10-CM | POA: Insufficient documentation

## 2019-10-22 DIAGNOSIS — Z8261 Family history of arthritis: Secondary | ICD-10-CM | POA: Insufficient documentation

## 2019-10-22 DIAGNOSIS — N329 Bladder disorder, unspecified: Secondary | ICD-10-CM | POA: Diagnosis not present

## 2019-10-22 HISTORY — PX: TRANSURETHRAL RESECTION OF BLADDER TUMOR: SHX2575

## 2019-10-22 LAB — POCT I-STAT, CHEM 8
BUN: 14 mg/dL (ref 8–23)
Calcium, Ion: 1.41 mmol/L — ABNORMAL HIGH (ref 1.15–1.40)
Chloride: 105 mmol/L (ref 98–111)
Creatinine, Ser: 0.6 mg/dL (ref 0.44–1.00)
Glucose, Bld: 106 mg/dL — ABNORMAL HIGH (ref 70–99)
HCT: 46 % (ref 36.0–46.0)
Hemoglobin: 15.6 g/dL — ABNORMAL HIGH (ref 12.0–15.0)
Potassium: 4 mmol/L (ref 3.5–5.1)
Sodium: 142 mmol/L (ref 135–145)
TCO2: 28 mmol/L (ref 22–32)

## 2019-10-22 SURGERY — TURBT (TRANSURETHRAL RESECTION OF BLADDER TUMOR)
Anesthesia: General | Site: Bladder

## 2019-10-22 MED ORDER — PHENAZOPYRIDINE HCL 100 MG PO TABS: 100.0000 mg | ORAL_TABLET | Freq: Three times a day (TID) | ORAL | 0 refills | Status: DC | PRN

## 2019-10-22 MED ORDER — PROPOFOL 10 MG/ML IV BOLUS
INTRAVENOUS | Status: AC
  Filled 2019-10-22: qty 20

## 2019-10-22 MED ORDER — CEFAZOLIN SODIUM-DEXTROSE 2-4 GM/100ML-% IV SOLN
INTRAVENOUS | Status: AC
  Filled 2019-10-22: qty 100

## 2019-10-22 MED ORDER — LACTATED RINGERS IV SOLN
INTRAVENOUS | Status: DC
  Filled 2019-10-22: qty 1000

## 2019-10-22 MED ORDER — HYDROCODONE-ACETAMINOPHEN 5-325 MG PO TABS: 1.0000 | ORAL_TABLET | ORAL | 0 refills | Status: DC | PRN

## 2019-10-22 MED ORDER — OXYCODONE HCL 5 MG/5ML PO SOLN
5.0000 mg | Freq: Once | ORAL | Status: AC | PRN
  Filled 2019-10-22: qty 5

## 2019-10-22 MED ORDER — ONDANSETRON HCL 4 MG/2ML IJ SOLN
INTRAMUSCULAR | Status: DC | PRN
  Administered 2019-10-22: 4 mg via INTRAVENOUS

## 2019-10-22 MED ORDER — PROPOFOL 10 MG/ML IV BOLUS
INTRAVENOUS | Status: DC | PRN
  Administered 2019-10-22: 160 mg via INTRAVENOUS

## 2019-10-22 MED ORDER — SODIUM CHLORIDE 0.9 % IR SOLN
Status: DC | PRN
  Administered 2019-10-22: 3000 mL

## 2019-10-22 MED ORDER — CEFAZOLIN SODIUM-DEXTROSE 2-4 GM/100ML-% IV SOLN
2.0000 g | INTRAVENOUS | Status: AC
  Administered 2019-10-22: 13:00:00 2 g via INTRAVENOUS
  Filled 2019-10-22: qty 100

## 2019-10-22 MED ORDER — LIDOCAINE 2% (20 MG/ML) 5 ML SYRINGE
INTRAMUSCULAR | Status: DC | PRN
  Administered 2019-10-22: 60 mg via INTRAVENOUS

## 2019-10-22 MED ORDER — OXYCODONE HCL 5 MG PO TABS
5.0000 mg | ORAL_TABLET | Freq: Once | ORAL | Status: AC | PRN
  Administered 2019-10-22: 5 mg via ORAL
  Filled 2019-10-22: qty 1

## 2019-10-22 MED ORDER — DEXAMETHASONE SODIUM PHOSPHATE 10 MG/ML IJ SOLN
INTRAMUSCULAR | Status: DC | PRN
  Administered 2019-10-22: 10 mg via INTRAVENOUS

## 2019-10-22 MED ORDER — DEXAMETHASONE SODIUM PHOSPHATE 10 MG/ML IJ SOLN
INTRAMUSCULAR | Status: AC
  Filled 2019-10-22: qty 1

## 2019-10-22 MED ORDER — LIDOCAINE 2% (20 MG/ML) 5 ML SYRINGE
INTRAMUSCULAR | Status: AC
  Filled 2019-10-22: qty 5

## 2019-10-22 MED ORDER — FENTANYL CITRATE (PF) 100 MCG/2ML IJ SOLN
INTRAMUSCULAR | Status: AC
  Filled 2019-10-22: qty 2

## 2019-10-22 MED ORDER — ONDANSETRON HCL 4 MG/2ML IJ SOLN
4.0000 mg | Freq: Once | INTRAMUSCULAR | Status: DC | PRN
  Filled 2019-10-22: qty 2

## 2019-10-22 MED ORDER — ACETAMINOPHEN 500 MG PO TABS
1000.0000 mg | ORAL_TABLET | Freq: Once | ORAL | Status: AC
  Administered 2019-10-22: 1000 mg via ORAL
  Filled 2019-10-22: qty 2

## 2019-10-22 MED ORDER — ACETAMINOPHEN 500 MG PO TABS
ORAL_TABLET | ORAL | Status: AC
  Filled 2019-10-22: qty 2

## 2019-10-22 MED ORDER — FENTANYL CITRATE (PF) 100 MCG/2ML IJ SOLN
25.0000 ug | INTRAMUSCULAR | Status: DC | PRN
  Administered 2019-10-22: 25 ug via INTRAVENOUS
  Administered 2019-10-22: 50 ug via INTRAVENOUS
  Administered 2019-10-22: 25 ug via INTRAVENOUS
  Filled 2019-10-22: qty 1

## 2019-10-22 MED ORDER — OXYCODONE HCL 5 MG PO TABS
ORAL_TABLET | ORAL | Status: AC
  Filled 2019-10-22: qty 1

## 2019-10-22 MED ORDER — FENTANYL CITRATE (PF) 100 MCG/2ML IJ SOLN
INTRAMUSCULAR | Status: DC | PRN
  Administered 2019-10-22: 50 ug via INTRAVENOUS
  Administered 2019-10-22 (×2): 25 ug via INTRAVENOUS

## 2019-10-22 MED ORDER — OXYBUTYNIN CHLORIDE ER 10 MG PO TB24: 10.0000 mg | ORAL_TABLET | Freq: Every day | ORAL | 2 refills | Status: DC

## 2019-10-22 MED ORDER — ONDANSETRON HCL 4 MG/2ML IJ SOLN
INTRAMUSCULAR | Status: AC
  Filled 2019-10-22: qty 2

## 2019-10-22 SURGICAL SUPPLY — 30 items
BAG DRAIN URO-CYSTO SKYTR STRL (DRAIN) ×3 IMPLANT
BAG DRN RND TRDRP ANRFLXCHMBR (UROLOGICAL SUPPLIES)
BAG DRN UROCATH (DRAIN) ×1
BAG URINE DRAIN 2000ML AR STRL (UROLOGICAL SUPPLIES) IMPLANT
BAG URINE LEG 500ML (DRAIN) ×2 IMPLANT
CATH FOLEY 3WAY 30CC 22F (CATHETERS) ×3 IMPLANT
CLOTH BEACON ORANGE TIMEOUT ST (SAFETY) ×3 IMPLANT
ELECT REM PT RETURN 9FT ADLT (ELECTROSURGICAL)
ELECTRODE REM PT RTRN 9FT ADLT (ELECTROSURGICAL) ×1 IMPLANT
GLOVE BIO SURGEON STRL SZ 6.5 (GLOVE) ×1 IMPLANT
GLOVE BIO SURGEON STRL SZ7.5 (GLOVE) ×2 IMPLANT
GLOVE BIO SURGEON STRL SZ8 (GLOVE) ×3 IMPLANT
GLOVE BIO SURGEONS STRL SZ 6.5 (GLOVE) ×1
GLOVE BIOGEL PI IND STRL 7.0 (GLOVE) IMPLANT
GLOVE BIOGEL PI IND STRL 7.5 (GLOVE) IMPLANT
GLOVE BIOGEL PI INDICATOR 7.0 (GLOVE) ×2
GLOVE BIOGEL PI INDICATOR 7.5 (GLOVE) ×2
GOWN STRL REUS W/TWL LRG LVL3 (GOWN DISPOSABLE) ×2 IMPLANT
GOWN STRL REUS W/TWL XL LVL3 (GOWN DISPOSABLE) ×5 IMPLANT
IV NS IRRIG 3000ML ARTHROMATIC (IV SOLUTION) ×3 IMPLANT
KIT TURNOVER CYSTO (KITS) ×3 IMPLANT
MANIFOLD NEPTUNE II (INSTRUMENTS) ×3 IMPLANT
PACK CYSTO (CUSTOM PROCEDURE TRAY) ×3 IMPLANT
PLUG CATH AND CAP STER (CATHETERS) ×2 IMPLANT
SYR 30ML LL (SYRINGE) ×3 IMPLANT
SYR TOOMEY IRRIG 70ML (MISCELLANEOUS)
SYRINGE TOOMEY IRRIG 70ML (MISCELLANEOUS) IMPLANT
TUBE CONNECTING 12'X1/4 (SUCTIONS) ×1
TUBE CONNECTING 12X1/4 (SUCTIONS) ×2 IMPLANT
TUBING UROLOGY SET (TUBING) ×3 IMPLANT

## 2019-10-22 NOTE — Anesthesia Postprocedure Evaluation (Signed)
Anesthesia Post Note  Patient: Christina [name]  Procedure(s) Performed: TRANSURETHRAL RESECTION OF BLADDER TUMOR (TURBT) (N/A Bladder)     Patient location during evaluation: Phase II Anesthesia Type: General Level of consciousness: awake Pain management: pain level controlled Vital Signs Assessment: post-procedure vital signs reviewed and stable Respiratory status: spontaneous breathing Cardiovascular status: stable Postop Assessment: no apparent nausea or vomiting Anesthetic complications: no    Last Vitals:  Vitals:   10/22/19 1345 10/22/19 1400  BP: (!) 156/79 (!) 154/74  Pulse: 69 73  Resp: 16 12  Temp:    SpO2: 99% 94%    Last Pain:  Vitals:   10/22/19 1330  TempSrc:   PainSc: 5    Pain Goal: Patients Stated Pain Goal: 3 (10/22/19 1053)                 Huston Foley

## 2019-10-22 NOTE — Anesthesia Postprocedure Evaluation (Signed)
Anesthesia Post Note  Patient: Christina [name]  Procedure(s) Performed: TRANSURETHRAL RESECTION OF BLADDER TUMOR (TURBT) (N/A Bladder)     Patient location during evaluation: PACU Anesthesia Type: General Level of consciousness: awake and alert Pain management: pain level controlled Vital Signs Assessment: post-procedure vital signs reviewed and stable Respiratory status: spontaneous breathing, nonlabored ventilation and respiratory function stable Cardiovascular status: blood pressure returned to baseline and stable Postop Assessment: no apparent nausea or vomiting Anesthetic complications: no    Last Vitals:  Vitals:   10/22/19 1345 10/22/19 1400  BP: (!) 156/79 (!) 154/74  Pulse: 69 73  Resp: 16 12  Temp:    SpO2: 99% 94%    Last Pain:  Vitals:   10/22/19 1330  TempSrc:   PainSc: 5                  Catalina Gravel

## 2019-10-22 NOTE — H&P (Signed)
Urology Admission H&P  Chief Complaint: bladder cancer  History of Present Illness: Christina [name] is a 79yo with a hx of high grade bladder cancer s/p BCG who developed a tumor recurrance on office cystoscopy. No worsening LUTS. No hematuria. NO fevers/chills/sweats  Past Medical History:  Diagnosis Date  . Arthritis   . Asthma    a little?, no problems in several years  . Bladder tumor   . Cervical cancer screening 01/31/2012  . Chicken pox as a child  . Degenerative tear of acetabular labrum of left hip 02/26/2019  . Depression with anxiety 06/07/2009   Qualifier: Diagnosis of  By: [name] MD, [name]    . Dermatitis of external ear 07/11/2012  . Diverticulosis   . Hiatal hernia with gastroesophageal reflux 10/26/2010   Qualifier: Diagnosis of  By: [name] MD, [name]    . Hyperglycemia 06/21/2013  . Hyperlipidemia, mixed 10/16/2015   pt unaware  . Hypertension   . Left hip pain 07/10/2015  . Lesion of breast 12/03/2016   benign; resolved  . Low back pain 05/29/2015  . Measles as a child  . Mumps as a child  . Osteopenia 01/03/2017  . Overweight (BMI 25.0-29.9) 10/07/2008   Qualifier: Diagnosis of  By: [name] MD, [name]    . Palpitations    several years ago, not currently  . Pre-diabetes   . Psoriasis    ears  . RUQ pain 05/29/2015  . Spider veins    Bilateral legs  . Spinal stenosis   . Umbilical hernia   . Urinary frequency 09/28/2011  . Vertigo   . Wears dentures    Upper,   Past Surgical History:  Procedure Laterality Date  . ABDOMINAL SURGERY  1970's  . BREAST BIOPSY Right 1980   BREAST EXCISIONAL BIOPSY  . BREAST BIOPSY Left 1970   BREAST EXCISIONAL BIOPSY  . BREAST CYST ASPIRATION    . CATARACT EXTRACTION, BILATERAL    . COLONOSCOPY    . CYSTECTOMY     abdomen  . CYSTOSCOPY W/ RETROGRADES Bilateral 04/27/2019   Procedure: CYSTOSCOPY WITH RETROGRADE PYELOGRAM;  Surgeon: Cleon Gustin, MD;  Location: [facility];  Service: Urology;   Laterality: Bilateral;  . EYE SURGERY Bilateral    cataract  . INSERTION OF MESH N/A 06/11/2017   Procedure: INSERTION OF MESH;  Surgeon: Rolm Bookbinder, MD;  Location: [place];  Service: General;  Laterality: N/A;  BILATERAL TAP BLOCK  . TRANSURETHRAL RESECTION OF BLADDER TUMOR N/A 04/27/2019   Procedure: TRANSURETHRAL RESECTION OF BLADDER TUMOR (TURBT);  Surgeon: Cleon Gustin, MD;  Location: [facility];  Service: Urology;  Laterality: N/A;  1 HR  . TRANSURETHRAL RESECTION OF BLADDER TUMOR N/A 06/01/2019   Procedure: TRANSURETHRAL RESECTION OF BLADDER TUMOR (TURBT);  Surgeon: Cleon Gustin, MD;  Location: [facility];  Service: Urology;  Laterality: N/A;  1 HR  . TUBAL LIGATION  1970  . VENTRAL HERNIA REPAIR N/A 06/11/2017   Procedure: LAPAROSCOPIC VENTRAL HERNIA REPAIR WITH MESH ERAS PATHWAY;  Surgeon: Rolm Bookbinder, MD;  Location: [place];  Service: General;  Laterality: N/A;  BILATERAL TAP BLOCK    Home Medications:  Current Facility-Administered Medications  Medication Dose Route Frequency Provider Last Rate Last Admin  . ceFAZolin (ANCEF) IVPB 2g/100 mL premix  2 g Intravenous 30 min Pre-Op Charmika Macdonnell, Candee Furbish, MD      . lactated ringers infusion   Intravenous Continuous Audry Pili, MD 50 mL/hr at 10/22/19 1101  New Bag at 10/22/19 1101  . sodium chloride irrigation 0.9 %    PRN Cleon Gustin, MD   3,000 mL at 10/22/19 1245   Allergies:  Allergies  Allergen Reactions  . Nutrasweet Aspartame [Aspartame] Diarrhea  . Lisinopril Cough    Family History  Problem Relation Age of Onset  . Hypertension Mother   . Anxiety disorder Mother   . Ovarian cancer Mother 62       lung- smoker, ovarian  . Lung cancer Mother        smoker  . Arthritis Mother   . Hyperlipidemia Mother   . Alcohol abuse Father   . Diabetes Sister   . Hypertension Sister   . Ovarian cancer Sister   . Arthritis Sister   . Depression Sister   .  Hyperlipidemia Sister   . Other Maternal Grandmother        pacemaker  . Multiple sclerosis Maternal Grandfather        ?  [name] Arthritis Brother   . COPD Brother   . Heart attack Brother   . Hyperlipidemia Brother   . Stroke Brother   . Asthma Sister   . Depression Sister   . Hyperlipidemia Sister   . Asthma Sister   . Depression Sister   . Asthma Paternal Uncle    Social History:  reports that she quit smoking about 29 years ago. Her smoking use included cigarettes. She has a 30.00 pack-year smoking history. She has never used smokeless tobacco. She reports that she does not drink alcohol or use drugs.  Review of Systems  All other systems reviewed and are negative.   Physical Exam:  Vital signs in last 24 hours: Temp:  [97.6 F (36.4 C)] 97.6 F (36.4 C) (02/25 1053) Pulse Rate:  [79] 79 (02/25 1053) Resp:  [12] 12 (02/25 1053) BP: (167)/(77) 167/77 (02/25 1053) SpO2:  [97 %] 97 % (02/25 1053) Weight:  [78 kg] 78 kg (02/25 1053) Physical Exam  Constitutional: She is oriented to person, place, and time. She appears well-developed and well-nourished.  HENT:  Head: Normocephalic and atraumatic.  Eyes: Pupils are equal, round, and reactive to light. EOM are normal.  Neck: No thyromegaly present.  Cardiovascular: Normal rate and regular rhythm.  Respiratory: Effort normal. No respiratory distress.  GI: Soft. She exhibits no distension.  Musculoskeletal:        General: No edema. Normal range of motion.     Cervical back: Normal range of motion.  Neurological: She is alert and oriented to person, place, and time.  Skin: Skin is warm and dry.  Psychiatric: She has a normal mood and affect. Her behavior is normal. Judgment and thought content normal.    Laboratory Data:  Results for orders placed or performed during the hospital encounter of 10/22/19 (from the past 24 hour(s))  I-STAT, chem 8     Status: Abnormal   Collection Time: 10/22/19 10:56 AM  Result Value Ref  Range   Sodium 142 135 - 145 mmol/L   Potassium 4.0 3.5 - 5.1 mmol/L   Chloride 105 98 - 111 mmol/L   BUN 14 8 - 23 mg/dL   Creatinine, Ser 0.60 0.44 - 1.00 mg/dL   Glucose, Bld 106 (H) 70 - 99 mg/dL   Calcium, Ion 1.41 (H) 1.15 - 1.40 mmol/L   TCO2 28 22 - 32 mmol/L   Hemoglobin 15.6 (H) 12.0 - 15.0 g/dL   HCT 46.0 36.0 - 46.0 %   Recent  Results (from the past 240 hour(s))  SARS CORONAVIRUS 2 (TAT 6-24 HRS) Nasopharyngeal Nasopharyngeal Swab     Status: None   Collection Time: 10/19/19  1:03 PM   Specimen: Nasopharyngeal Swab  Result Value Ref Range Status   SARS Coronavirus 2 NEGATIVE NEGATIVE Final    Comment: (NOTE) SARS-CoV-2 target nucleic acids are NOT DETECTED. The SARS-CoV-2 RNA is generally detectable in upper and lower respiratory specimens during the acute phase of infection. Negative results do not preclude SARS-CoV-2 infection, do not rule out co-infections with other pathogens, and should not be used as the sole basis for treatment or other patient management decisions. Negative results must be combined with clinical observations, patient history, and epidemiological information. The expected result is Negative. Fact Sheet for Patients: SugarRoll.be Fact Sheet for Healthcare Providers: https://www.woods-mathews.com/ This test is not yet approved or cleared by the [place] FDA and  has been authorized for detection and/or diagnosis of SARS-CoV-2 by FDA under an Emergency Use Authorization (EUA). This EUA will remain  in effect (meaning this test can be used) for the duration of the COVID-19 declaration under Section 56 4(b)(1) of the Act, 21 U.S.C. section 360bbb-3(b)(1), unless the authorization is terminated or revoked sooner. Performed at [facility], [place] [address], [place], [place] [address]    Creatinine: Recent Labs    10/22/19 1056  CREATININE 0.60   Baseline Creatinine:  0.6  Impression/Assessment:  78yo with bladder cancer  Plan:  The risks/benefits/alterantives to TURBT was explained to the patient and she understands and wishes to proceed with surgery  Christina [name] 10/22/2019, 12:19 PM

## 2019-10-22 NOTE — Transfer of Care (Signed)
Immediate Anesthesia Transfer of Care Note  Patient: Christina [name]  Procedure(s) Performed: TRANSURETHRAL RESECTION OF BLADDER TUMOR (TURBT) (N/A Bladder)  Patient Location: PACU  Anesthesia Type:General  Level of Consciousness: awake, alert  and oriented  Airway & Oxygen Therapy: Patient Spontanous Breathing and Patient connected to nasal cannula oxygen  Post-op Assessment: Report given to RN and Post -op Vital signs reviewed and stable  Post vital signs: Reviewed and stable  Last Vitals:  Vitals Value Taken Time  BP    Temp    Pulse    Resp    SpO2      Last Pain:  Vitals:   10/22/19 1053  TempSrc: Oral  PainSc: 0-No pain      Patients Stated Pain Goal: 3 ([id number])  Complications: No apparent anesthesia complications

## 2019-10-22 NOTE — Anesthesia Procedure Notes (Signed)
Procedure Name: LMA Insertion Date/Time: 10/22/2019 12:33 PM Performed by: Angala Hilgers D, CRNA Pre-anesthesia Checklist: Patient identified, Emergency Drugs available, Suction available and Patient being monitored Patient Re-evaluated:Patient Re-evaluated prior to induction Oxygen Delivery Method: Circle system utilized Preoxygenation: Pre-oxygenation with 100% oxygen Induction Type: IV induction Ventilation: Mask ventilation without difficulty LMA Size: 4.0 Tube type: Oral Number of attempts: 1 Placement Confirmation: positive ETCO2 and breath sounds checked- equal and bilateral Tube secured with: Tape Dental Injury: Teeth and Oropharynx as per pre-operative assessment

## 2019-10-22 NOTE — Anesthesia Preprocedure Evaluation (Addendum)
Anesthesia Evaluation  Patient identified by MRN, date of birth, ID band Patient awake    Reviewed: Allergy & Precautions, NPO status , Patient's Chart, lab work & pertinent test results  Airway Mallampati: II  TM Distance: >3 FB Neck ROM: Full    Dental  (+) Upper Dentures   Pulmonary asthma , former smoker,    Pulmonary exam normal breath sounds clear to auscultation       Cardiovascular hypertension, Pt. on medications Normal cardiovascular exam Rhythm:Regular Rate:Normal     Neuro/Psych PSYCHIATRIC DISORDERS Anxiety Depression negative neurological ROS     GI/Hepatic Neg liver ROS, GERD  ,  Endo/Other  negative endocrine ROS  Renal/GU negative Renal ROS   Bladder tumor     Musculoskeletal  (+) Arthritis ,   Abdominal   Peds  Hematology negative hematology ROS (+)   Anesthesia Other Findings Day of surgery medications reviewed with the patient.  Reproductive/Obstetrics                            Anesthesia Physical Anesthesia Plan  ASA: III  Anesthesia Plan: General   Post-op Pain Management:    Induction: Intravenous  PONV Risk Score and Plan: 4 or greater and Dexamethasone, Ondansetron and Treatment may vary due to age or medical condition  Airway Management Planned: LMA and Oral ETT  Additional Equipment:   Intra-op Plan:   Post-operative Plan: Extubation in OR  Informed Consent: I have reviewed the patients History and Physical, chart, labs and discussed the procedure including the risks, benefits and alternatives for the proposed anesthesia with the patient or authorized representative who has indicated his/her understanding and acceptance.     Dental advisory given  Plan Discussed with: CRNA  Anesthesia Plan Comments: ( )       Anesthesia Quick Evaluation

## 2019-10-22 NOTE — Op Note (Signed)
.  Preoperative diagnosis: bladder tumor  Postoperative diagnosis: Same  Procedure: 1 cystoscopy 2. Transurethral resection of bladder tumor, medium  Attending: Rosie Fate  Anesthesia: General  Estimated blood loss: Minimal  Drains: 22 French foley  Specimens: right posterior wall bladder tumor  Antibiotics: ancef  Findings: 3cm sessile right posterior wall tumor.  Ureteral orifices in normal anatomic location.   Indications: Patient is a 79 year old emale with a history of high grade bladder cancer who developed a recurrence.   After discussing treatment options, they decided proceed with transurethral resection of a bladder tumor.  Procedure her in detail: The patient was brought to the operating room and a brief timeout was done to ensure correct patient, correct procedure, correct site.  General anesthesia was administered patient was placed in dorsal lithotomy position.  Their genitalia was then prepped and draped in usual sterile fashion.  A rigid 65 French cystoscope was passed in the urethra and the bladder.  Bladder was inspected and we noted  a 3cm bladder tumor.  the ureteral orifices were in the normal orthotopic locations. Using the bipolar resectoscope we removed the bladder tumor down to the base. A subsequent muscle deep biopsy was then taken. Hemostasis was then obtained with electrocautery. We then removed the bladder tumor chips and sent them for pathology. We then re-inspected the bladder and found no residula bleeding.  the bladder was then drained, a 22 French foley was placed and this concluded the procedure which was well tolerated by patient.  Complications: None  Condition: Stable, extubated, transferred to PACU  Plan: Patient is to be discharged home and followup in 5 days for foley catheter removal and pathology discussion.

## 2019-10-22 NOTE — Discharge Instructions (Signed)
Indwelling Urinary Catheter Care, Adult An indwelling urinary catheter is a thin tube that is put into your bladder. The tube helps to drain pee (urine) out of your body. The tube goes in through your urethra. Your urethra is where pee comes out of your body. Your pee will come out through the catheter, then it will go into a bag (drainage bag). Take good care of your catheter so it will work well. How to wear your catheter and bag Supplies needed  Sticky tape (adhesive tape) or a leg strap.  Alcohol wipe or soap and water (if you use tape).  A clean towel (if you use tape).  Large overnight bag.  Smaller bag (leg bag). Wearing your catheter Attach your catheter to your leg with tape or a leg strap.  Make sure the catheter is not pulled tight.  If a leg strap gets wet, take it off and put on a dry strap.  If you use tape to hold the bag on your leg: 1. Use an alcohol wipe or soap and water to wash your skin where the tape made it sticky before. 2. Use a clean towel to pat-dry that skin. 3. Use new tape to make the bag stay on your leg. Wearing your bags You should have been given a large overnight bag.  You may wear the overnight bag in the day or night.  Always have the overnight bag lower than your bladder.  Do not let the bag touch the floor.  Before you go to sleep, put a clean plastic bag in a wastebasket. Then hang the overnight bag inside the wastebasket. You should also have a smaller leg bag that fits under your clothes.  Always wear the leg bag below your knee.  Do not wear your leg bag at night. How to care for your skin and catheter Supplies needed  A clean washcloth.  Water and mild soap.  A clean towel. Caring for your skin and catheter      Clean the skin around your catheter every day: 1. Wash your hands with soap and water. 2. Wet a clean washcloth in warm water and mild soap. 3. Clean the skin around your urethra.  If you are  female:  Gently spread the folds of skin around your vagina (labia).  With the washcloth in your other hand, wipe the inner side of your labia on each side. Wipe from front to back.  If you are female:  Pull back any skin that covers the end of your penis (foreskin).  With the washcloth in your other hand, wipe your penis in small circles. Start wiping at the tip of your penis, then move away from the catheter.  Move the foreskin back in place, if needed. 4. With your free hand, hold the catheter close to where it goes into your body.  Keep holding the catheter during cleaning so it does not get pulled out. 5. With the washcloth in your other hand, clean the catheter.  Only wipe downward on the catheter.  Do not wipe upward toward your body. Doing this may push germs into your urethra and cause infection. 6. Use a clean towel to pat-dry the catheter and the skin around it. Make sure to wipe off all soap. 7. Wash your hands with soap and water.  Shower every day. Do not take baths.  Do not use cream, ointment, or lotion on the area where the catheter goes into your body, unless your doctor tells you   to.  Do not use powders, sprays, or lotions on your genital area.  Check your skin around the catheter every day for signs of infection. Check for: ? Redness, swelling, or pain. ? Fluid or blood. ? Warmth. ? Pus or a bad smell. How to empty the bag Supplies needed  Rubbing alcohol.  Gauze pad or cotton ball.  Tape or a leg strap. Emptying the bag Pour the pee out of your bag when it is ?- full, or at least 2-3 times a day. Do this for your overnight bag and your leg bag. 1. Wash your hands with soap and water. 2. Separate (detach) the bag from your leg. 3. Hold the bag over the toilet or a clean pail. Keep the bag lower than your hips and bladder. This is so the pee (urine) does not go back into the tube. 4. Open the pour spout. It is at the bottom of the bag. 5. Empty the  pee into the toilet or pail. Do not let the pour spout touch any surface. 6. Put rubbing alcohol on a gauze pad or cotton ball. 7. Use the gauze pad or cotton ball to clean the pour spout. 8. Close the pour spout. 9. Attach the bag to your leg with tape or a leg strap. 10. Wash your hands with soap and water. Follow instructions for cleaning the drainage bag:  From the product maker.  As told by your doctor. How to change the bag Supplies needed  Alcohol wipes.  A clean bag.  Tape or a leg strap. Changing the bag Replace your bag when it starts to leak, smell bad, or look dirty. 1. Wash your hands with soap and water. 2. Separate the dirty bag from your leg. 3. Pinch the catheter with your fingers so that pee does not spill out. 4. Separate the catheter tube from the bag tube where these tubes connect (at the connection valve). Do not let the tubes touch any surface. 5. Clean the end of the catheter tube with an alcohol wipe. Use a different alcohol wipe to clean the end of the bag tube. 6. Connect the catheter tube to the tube of the clean bag. 7. Attach the clean bag to your leg with tape or a leg strap. Do not make the bag tight on your leg. 8. Wash your hands with soap and water. General rules   Never pull on your catheter. Never try to take it out. Doing that can hurt you.  Always wash your hands before and after you touch your catheter or bag. Use a mild, fragrance-free soap. If you do not have soap and water, use hand sanitizer.  Always make sure there are no twists or bends (kinks) in the catheter tube.  Always make sure there are no leaks in the catheter or bag.  Drink enough fluid to keep your pee pale yellow.  Do not take baths, swim, or use a hot tub.  If you are female, wipe from front to back after you poop (have a bowel movement). Contact a doctor if:  Your pee is cloudy.  Your pee smells worse than usual.  Your catheter gets clogged.  Your catheter  leaks.  Your bladder feels full. Get help right away if:  You have redness, swelling, or pain where the catheter goes into your body.  You have fluid, blood, pus, or a bad smell coming from the area where the catheter goes into your body.  Your skin feels warm where   the catheter goes into your body.  You have a fever.  You have pain in your: ? Belly (abdomen). ? Legs. ? Lower back. ? Bladder.  You see blood in the catheter.  Your pee is pink or red.  You feel sick to your stomach (nauseous).  You throw up (vomit).  You have chills.  Your pee is not draining into the bag.  Your catheter gets pulled out. Summary  An indwelling urinary catheter is a thin tube that is placed into the bladder to help drain pee (urine) out of the body.  The catheter is placed into the part of the body that drains pee from the bladder (urethra).  Taking good care of your catheter will keep it working properly and help prevent problems.  Always wash your hands before and after touching your catheter or bag.  Never pull on your catheter or try to take it out. This information is not intended to replace advice given to you by your health care provider. Make sure you discuss any questions you have with your health care provider. Document Revised: 12/05/2018 Document Reviewed: 03/29/2017 Else[employer] Patient Education  2020 [employer]   Post Anesthesia Home Care Instructions  Activity: Get plenty of rest for the remainder of the day. A responsible individual must stay with you for 24 hours following the procedure.  For the next 24 hours, DO NOT: -Drive a car -Operate machinery -Drink alcoholic beverages -Take any medication unless instructed by your physician -Make any legal decisions or sign important papers.  Meals: Start with liquid foods such as gelatin or soup. Progress to regular foods as tolerated. Avoid greasy, spicy, heavy foods. If nausea and/or vomiting occur, drink only  clear liquids until the nausea and/or vomiting subsides. Call your physician if vomiting continues.  Special Instructions/Symptoms: Your throat may feel dry or sore from the anesthesia or the breathing tube placed in your throat during surgery. If this causes discomfort, gargle with warm salt water. The discomfort should disappear within 24 hours.   

## 2019-10-23 LAB — SURGICAL PATHOLOGY

## 2019-10-29 DIAGNOSIS — C672 Malignant neoplasm of lateral wall of bladder: Secondary | ICD-10-CM | POA: Diagnosis not present

## 2019-10-30 ENCOUNTER — Encounter: Payer: Self-pay | Admitting: *Deleted

## 2019-10-30 NOTE — Progress Notes (Signed)
Patient would like to see Dr Marin Olp next week as she has been told she needs bladder removal and she'd like Dr Antonieta Pert opinion first. Spoke to Dr Marin Olp for ok. Message sent to schedulers. Patient knows to expect a call from them to schedule appointment.

## 2019-11-02 ENCOUNTER — Encounter: Payer: Self-pay | Admitting: *Deleted

## 2019-11-04 ENCOUNTER — Inpatient Hospital Stay (HOSPITAL_BASED_OUTPATIENT_CLINIC_OR_DEPARTMENT_OTHER): Payer: Medicare PPO | Admitting: Hematology & Oncology

## 2019-11-04 ENCOUNTER — Other Ambulatory Visit: Payer: Self-pay

## 2019-11-04 ENCOUNTER — Encounter: Payer: Self-pay | Admitting: Hematology & Oncology

## 2019-11-04 ENCOUNTER — Inpatient Hospital Stay: Payer: Medicare PPO | Attending: Hematology & Oncology

## 2019-11-04 VITALS — BP 134/81 | HR 81 | Temp 97.3°F | Resp 20 | Wt 169.4 lb

## 2019-11-04 DIAGNOSIS — Z9221 Personal history of antineoplastic chemotherapy: Secondary | ICD-10-CM | POA: Insufficient documentation

## 2019-11-04 DIAGNOSIS — C670 Malignant neoplasm of trigone of bladder: Secondary | ICD-10-CM | POA: Diagnosis not present

## 2019-11-04 DIAGNOSIS — C673 Malignant neoplasm of anterior wall of bladder: Secondary | ICD-10-CM

## 2019-11-04 DIAGNOSIS — Z923 Personal history of irradiation: Secondary | ICD-10-CM | POA: Insufficient documentation

## 2019-11-04 DIAGNOSIS — Z79899 Other long term (current) drug therapy: Secondary | ICD-10-CM | POA: Diagnosis not present

## 2019-11-04 DIAGNOSIS — C672 Malignant neoplasm of lateral wall of bladder: Secondary | ICD-10-CM | POA: Insufficient documentation

## 2019-11-04 LAB — CBC WITH DIFFERENTIAL (CANCER CENTER ONLY)
Abs Immature Granulocytes: 0.02 10*3/uL (ref 0.00–0.07)
Basophils Absolute: 0.1 10*3/uL (ref 0.0–0.1)
Basophils Relative: 1 %
Eosinophils Absolute: 0.1 10*3/uL (ref 0.0–0.5)
Eosinophils Relative: 1 %
HCT: 42 % (ref 36.0–46.0)
Hemoglobin: 13.5 g/dL (ref 12.0–15.0)
Immature Granulocytes: 0 %
Lymphocytes Relative: 34 %
Lymphs Abs: 2.9 10*3/uL (ref 0.7–4.0)
MCH: 31.3 pg (ref 26.0–34.0)
MCHC: 32.1 g/dL (ref 30.0–36.0)
MCV: 97.4 fL (ref 80.0–100.0)
Monocytes Absolute: 0.7 10*3/uL (ref 0.1–1.0)
Monocytes Relative: 8 %
Neutro Abs: 4.8 10*3/uL (ref 1.7–7.7)
Neutrophils Relative %: 56 %
Platelet Count: 300 10*3/uL (ref 150–400)
RBC: 4.31 MIL/uL (ref 3.87–5.11)
RDW: 12.7 % (ref 11.5–15.5)
WBC Count: 8.6 10*3/uL (ref 4.0–10.5)
nRBC: 0 % (ref 0.0–0.2)

## 2019-11-04 LAB — CMP (CANCER CENTER ONLY)
ALT: 9 U/L (ref 0–44)
AST: 12 U/L — ABNORMAL LOW (ref 15–41)
Albumin: 4.2 g/dL (ref 3.5–5.0)
Alkaline Phosphatase: 72 U/L (ref 38–126)
Anion gap: 6 (ref 5–15)
BUN: 18 mg/dL (ref 8–23)
CO2: 29 mmol/L (ref 22–32)
Calcium: 10.5 mg/dL — ABNORMAL HIGH (ref 8.9–10.3)
Chloride: 105 mmol/L (ref 98–111)
Creatinine: 0.83 mg/dL (ref 0.44–1.00)
GFR, Est AFR Am: >60 mL/min (ref >60)
GFR, Estimated: >60 mL/min (ref >60)
Glucose, Bld: 113 mg/dL — ABNORMAL HIGH (ref 70–99)
Potassium: 4.3 mmol/L (ref 3.5–5.1)
Sodium: 140 mmol/L (ref 135–145)
Total Bilirubin: 0.8 mg/dL (ref 0.3–1.2)
Total Protein: 6.9 g/dL (ref 6.5–8.1)

## 2019-11-04 MED ORDER — PAROXETINE HCL 20 MG PO TABS: 20.0000 mg | ORAL_TABLET | Freq: Every day | ORAL | 3 refills | Status: DC

## 2019-11-04 NOTE — Progress Notes (Signed)
Hematology and Oncology Follow Up Visit  Christina [name] [id number] Jan 18, 1941 79 y.o. 11/04/2019   Principle Diagnosis:   Superficial bladder cancer -- now muscle invasive  Current Therapy:    Intravesicular BCG therapy     Interim History:  Christina [name] is back for an early visit.  Unfortunately, we now have a problem with respect to her superficial bladder cancer.  The bladder cancer is now muscle invasive.  She has been treated aggressively with intravesicular BCG.  She subsequently underwent a another cystoscopy.  This was done a couple weeks or so ago.  The pathology report Christina [name] [id number]) showed muscle invasive high-grade bladder cancer.  There was definitely invasion into the muscularis propria.  I am quite worried about this.  It does not sound like she really responded to the BCG.  I do not think there is any disease elsewhere.  I probably would set her up with a PET scan soon to make sure that there is no disease elsewhere.  Her urologist has recommended a cystectomy.  I think this probably is the best way to treat this.  Again, I worry that since this was not really responsive to therapy, I am not sure how well she would respond to systemic therapy and radiation therapy.  She is quite worried over this.  I told her that it certainly would not be a bad idea to get a second opinion.  She will speak to her urologist about getting a second opinion at [name].  I realize that the surgery is quite extensive.  I realize that she is "mature".  She is in good shape.  She has had no problems with bleeding.  There is no hematuria.  This is very disappointing to me.  Typically, the superficial bladder cancers respond nicely to intravesicular therapy.  I suppose that she could have bladder sparing therapy although I just worry that this would not eradicate this tumor.  Her appetite is okay.  She has had no nausea or vomiting.  There is no pain.  Overall, her performance status is  ECOG 1.  Medications:  Current Outpatient Medications:  .  LORazepam (ATIVAN) 0.5 MG tablet, Take 1 tablet (0.5 mg total) by mouth 2 (two) times daily as needed for anxiety., Disp: 60 tablet, Rfl: 5 .  losartan (COZAAR) 50 MG tablet, Take 1.5 tablets (75 mg total) by mouth daily., Disp: 135 tablet, Rfl: 1 .  meclizine (ANTIVERT) 25 MG tablet, Take 1 tablet (25 mg total) by mouth 3 (three) times daily as needed for dizziness., Disp: 30 tablet, Rfl: 0 .  triamcinolone cream (KENALOG) 0.1 %, Apply topically 2 (two) times daily. (Patient taking differently: Apply 1 application topically 2 (two) times daily as needed (in ears). ), Disp: 80 g, Rfl: 0  Allergies:  Allergies  Allergen Reactions  . Nutrasweet Aspartame [Aspartame] Diarrhea  . Lisinopril Cough    Past Medical History, Surgical history, Social history, and Family History were reviewed and updated.  Review of Systems: Review of Systems  Constitutional: Negative.   HENT:  Negative.   Eyes: Negative.   Respiratory: Negative.   Cardiovascular: Negative.   Gastrointestinal: Negative.   Endocrine: Negative.   Genitourinary: Negative.    Musculoskeletal: Negative.   Skin: Negative.   Neurological: Negative.   Hematological: Negative.   Psychiatric/Behavioral: Negative.     Physical Exam:  weight is 169 lb 6.4 oz (76.8 kg). Her temporal temperature is 97.3 F (36.3 C) (abnormal). Her blood pressure is 134/81 and  her pulse is 81. Her respiration is 20 and oxygen saturation is 96%.   Wt Readings from Last 3 Encounters:  11/04/19 169 lb 6.4 oz (76.8 kg)  10/22/19 171 lb 14.4 oz (78 kg)  10/07/19 173 lb 4 oz (78.6 kg)    Physical Exam Vitals reviewed.  HENT:     Head: Normocephalic and atraumatic.  Eyes:     Pupils: Pupils are equal, round, and reactive to light.  Cardiovascular:     Rate and Rhythm: Normal rate and regular rhythm.     Heart sounds: Normal heart sounds.  Pulmonary:     Effort: Pulmonary effort is  normal.     Breath sounds: Normal breath sounds.  Abdominal:     General: Bowel sounds are normal.     Palpations: Abdomen is soft.  Musculoskeletal:        General: No tenderness or deformity. Normal range of motion.     Cervical back: Normal range of motion.  Lymphadenopathy:     Cervical: No cervical adenopathy.  Skin:    General: Skin is warm and dry.     Findings: No erythema or rash.  Neurological:     Mental Status: She is alert and oriented to person, place, and time.  Psychiatric:        Behavior: Behavior normal.        Thought Content: Thought content normal.        Judgment: Judgment normal.      Lab Results  Component Value Date   WBC 8.6 11/04/2019   HGB 13.5 11/04/2019   HCT 42.0 11/04/2019   MCV 97.4 11/04/2019   PLT 300 11/04/2019     Chemistry      Component Value Date/Time   NA 140 11/04/2019 0958   NA 142 03/04/2017 1016   K 4.3 11/04/2019 0958   CL 105 11/04/2019 0958   CO2 29 11/04/2019 0958   BUN 18 11/04/2019 0958   BUN 13 03/04/2017 1016   CREATININE 0.83 11/04/2019 0958   CREATININE 0.78 05/20/2015 1704      Component Value Date/Time   CALCIUM 10.5 (H) 11/04/2019 0958   ALKPHOS 72 11/04/2019 0958   AST 12 (L) 11/04/2019 0958   ALT 9 11/04/2019 0958   BILITOT 0.8 11/04/2019 0958      Impression and Plan: Christina [name] is a 79 year old white female.  She had superficial bladder cancer.  Now, she has muscle invasive bladder cancer.  This is despite intravesicular therapy with BCG.  Again, I would recommend a cystectomy.  I realize that this is quite aggressive.  I does have a sense that this tumor is also quite aggressive and this might not respond to radiation therapy and chemotherapy.  She is going to seek a second opinion.  She will talk to her urologist regarding this.  We will be glad to see her back after she has definitive surgery.  Again we will order the PET scan on her.  I just do not think that there is going to be a  problem with locally advanced disease.  I spent about 45 minutes or so with her today.  This was very distressing to her.  She is very labile emotionally.  We had a good prayer.  Her faith still remains strong.   Christina Napoleon, MD 3/10/202110:43 AM

## 2019-11-05 ENCOUNTER — Telehealth: Payer: Self-pay | Admitting: Hematology & Oncology

## 2019-11-05 ENCOUNTER — Ambulatory Visit (INDEPENDENT_AMBULATORY_CARE_PROVIDER_SITE_OTHER): Payer: Medicare PPO

## 2019-11-05 DIAGNOSIS — Z1231 Encounter for screening mammogram for malignant neoplasm of breast: Secondary | ICD-10-CM | POA: Diagnosis not present

## 2019-11-05 NOTE — Telephone Encounter (Signed)
No los 3/10

## 2019-11-09 ENCOUNTER — Encounter: Payer: Self-pay | Admitting: Radiation Oncology

## 2019-11-09 NOTE — Progress Notes (Signed)
GU Location of Tumor / Histology: Recurrent high grade bladder ca now invading muscle   Christina [name] had a TURBT and resection of tumor August 2020 by Dr. Alyson Ingles. Patient received BCG under the care of Dr. Alyson Ingles. Patient reports seeing Dr. Margurite Auerbach in consult.Repeat Cysto TURBT 10/22/19 reveal muscular invasion and BCG failure. Radical cystoscopy recommended. Patient seeking a second opinion at [place] with an appointment for 11/25/2019.  PET at [facility] schedule for 11/16/2019.  Past/Anticipated interventions by urology, if any: TURBT, repeat TURBT, referral to Dr. Tammi Klippel for consideration of radical cystectomy, referral to Dr. Tammi Klippel for consideration of bladder sparing tx.    Past/Anticipated interventions by medical oncology, if any: consultation  Weight changes, if any: Yes due to decreased appetite. Reports a ten pound weight loss since diagnosis.   Bowel/Bladder complaints, if any: Reports intermittent dysuria. Denies hematuria. Reports a steady urine stream. Reports intermittently having the sensation of not emptying her bladder completely. Reports urinary leakage. Denies urinary leakage. Denies urinary leakage or incontinence. Reports intermittent constipation.  Nausea/Vomiting, if any: denies  Pain issues, if any:  Related to dysuria.   SAFETY ISSUES:  Prior radiation? denies  Pacemaker/ICD? denies  Possible current pregnancy? no  Is the patient on methotrexate? no  Current Complaints / other details:  79 year old female. Widowed. 2 sons. Retired. Stopped smoking in 1992 after 35 years.

## 2019-11-10 ENCOUNTER — Ambulatory Visit
Admission: RE | Admit: 2019-11-10 | Discharge: 2019-11-10 | Disposition: A | Discharge status: Home / Self Care | Payer: Medicare PPO | Source: Ambulatory Visit | Attending: Radiation Oncology | Admitting: Radiation Oncology

## 2019-11-10 ENCOUNTER — Other Ambulatory Visit: Payer: Self-pay

## 2019-11-10 ENCOUNTER — Encounter: Payer: Self-pay | Admitting: Radiation Oncology

## 2019-11-10 VITALS — Ht 65.0 in | Wt 170.0 lb

## 2019-11-10 DIAGNOSIS — C670 Malignant neoplasm of trigone of bladder: Secondary | ICD-10-CM

## 2019-11-10 DIAGNOSIS — Z9889 Other specified postprocedural states: Secondary | ICD-10-CM | POA: Diagnosis not present

## 2019-11-10 HISTORY — DX: Malignant neoplasm of bladder, unspecified: C67.9

## 2019-11-10 NOTE — Progress Notes (Signed)
See progress note under physician encounter. 

## 2019-11-10 NOTE — Progress Notes (Signed)
Radiation Oncology         ([phone]) [phone] ________________________________  Initial outpatient Consultation - Conducted via telephone due to current COVID-19 concerns for limiting patient exposure  Name: Christina [name] MRN: [id number]  Date of Service: 11/10/2019 DOB: 08-Oct-1940  [id number], Reinaldo Raddle, DO  McKenzie, Candee Furbish, MD   REFERRING PHYSICIAN: Alyson Ingles Candee Furbish, MD  DIAGNOSIS: The encounter diagnosis was Malignant neoplasm of trigone of urinary bladder ([place]).    ICD-10-CM   1. Malignant neoplasm of trigone of urinary bladder (HCC)  C67.0     HISTORY OF PRESENT ILLNESS: Christina [name] is a 79 y.o. female seen at the request of Dr. Alyson Ingles. She developed painless, gross hematuria around 02/28/2019 and was subsequently referred to Dr. Alyson Ingles on 03/17/2019. Urine cytology performed that day was positive, and abdomen/pelvis CT showed what was likely a right lateral bladder wall tumor. Cystoscopy performed on 04/17/2019 confirmed a 1 cm multifocal tumor on the right lateral bladder wall extending into a right lateral wall diverticulum.  She proceeded to TURBT on 04/27/2019. A 3 cm sessile and papillary right lateral wall tumor was removed. Final pathology revealed high-grade, pleomorphic papillary urothelial carcinoma, invading the lamina propria at level above the muscularis mucosa. Detrusor muscle not involved. She self-referred for consult with Dr. Marin Olp on 05/18/19 who agreed with the treatment approach and plan. A PET scan performed on 05/27/2019 showed no evidence of metastatic disease.  Following the initial TURBT, she continued to experience severe urgency and frequency after catheter removal. She proceeded to repeat resection on 06/01/2019 which showed residual invasive high-grade urothelial carcinoma, without definite detrusor muscle identified. She therefore proceeded with a 6 week course of intravesicular BCG therapy which was started on 06/30/19 and completed on 08/11/19.   Repeat in-office cystoscopy on 10/01/2019 unfortunately showed recurrent erythematous mucosa at the previous resection site. In light of this, she proceeded to repeat TURBT on 10/22/2019. Final pathology from this procedure revealed muscle invasive high-grade urothelial carcinoma. In situ carcinoma was also noted.  At this point, Dr. Alyson Ingles has recommended radical cystectomy versus a bladder sparing approach with concurrent chemoradiation. She has kindly been referred to discuss treatment options today and has an appointment for a second opinion at [facility] on 11/25/2019 regarding treatment recommendations.   PREVIOUS RADIATION THERAPY: No  PAST MEDICAL HISTORY:  Past Medical History:  Diagnosis Date  . Arthritis   . Asthma    a little?, no problems in several years  . Bladder cancer ([place])   . Bladder tumor   . Cervical cancer screening 01/31/2012  . Chicken pox as a child  . Degenerative tear of acetabular labrum of left hip 02/26/2019  . Depression with anxiety 06/07/2009   Qualifier: Diagnosis of  By: [name] MD, [name]    . Dermatitis of external ear 07/11/2012  . Diverticulosis   . Hiatal hernia with gastroesophageal reflux 10/26/2010   Qualifier: Diagnosis of  By: [name] MD, [name]    . Hyperglycemia 06/21/2013  . Hyperlipidemia, mixed 10/16/2015   pt unaware  . Hypertension   . Left hip pain 07/10/2015  . Lesion of breast 12/03/2016   benign; resolved  . Low back pain 05/29/2015  . Measles as a child  . Mumps as a child  . Osteopenia 01/03/2017  . Overweight (BMI 25.0-29.9) 10/07/2008   Qualifier: Diagnosis of  By: [name] MD, [name]    . Palpitations    several years ago, not currently  . Pre-diabetes   . Psoriasis  ears  . RUQ pain 05/29/2015  . Spider veins    Bilateral legs  . Spinal stenosis   . Umbilical hernia   . Urinary frequency 09/28/2011  . Vertigo   . Wears dentures    Upper,      PAST SURGICAL HISTORY: Past Surgical History:  Procedure  Laterality Date  . ABDOMINAL SURGERY  1970's  . BREAST BIOPSY Right 1980   BREAST EXCISIONAL BIOPSY  . BREAST BIOPSY Left 1970   BREAST EXCISIONAL BIOPSY  . BREAST CYST ASPIRATION    . CATARACT EXTRACTION, BILATERAL    . COLONOSCOPY    . CYSTECTOMY     abdomen  . CYSTOSCOPY W/ RETROGRADES Bilateral 04/27/2019   Procedure: CYSTOSCOPY WITH RETROGRADE PYELOGRAM;  Surgeon: Cleon Gustin, MD;  Location: [facility];  Service: Urology;  Laterality: Bilateral;  . EYE SURGERY Bilateral    cataract  . INSERTION OF MESH N/A 06/11/2017   Procedure: INSERTION OF MESH;  Surgeon: Rolm Bookbinder, MD;  Location: [place];  Service: General;  Laterality: N/A;  BILATERAL TAP BLOCK  . TRANSURETHRAL RESECTION OF BLADDER TUMOR N/A 04/27/2019   Procedure: TRANSURETHRAL RESECTION OF BLADDER TUMOR (TURBT);  Surgeon: Cleon Gustin, MD;  Location: [facility];  Service: Urology;  Laterality: N/A;  1 HR  . TRANSURETHRAL RESECTION OF BLADDER TUMOR N/A 06/01/2019   Procedure: TRANSURETHRAL RESECTION OF BLADDER TUMOR (TURBT);  Surgeon: Cleon Gustin, MD;  Location: [facility];  Service: Urology;  Laterality: N/A;  1 HR  . TRANSURETHRAL RESECTION OF BLADDER TUMOR N/A 10/22/2019   Procedure: TRANSURETHRAL RESECTION OF BLADDER TUMOR (TURBT);  Surgeon: Cleon Gustin, MD;  Location: [facility];  Service: Urology;  Laterality: N/A;  . TUBAL LIGATION  1970  . VENTRAL HERNIA REPAIR N/A 06/11/2017   Procedure: LAPAROSCOPIC VENTRAL HERNIA REPAIR WITH MESH ERAS PATHWAY;  Surgeon: Rolm Bookbinder, MD;  Location: [place];  Service: General;  Laterality: N/A;  BILATERAL TAP BLOCK    FAMILY HISTORY:  Family History  Problem Relation Age of Onset  . Hypertension Mother   . Anxiety disorder Mother   . Ovarian cancer Mother 68       lung- smoker, ovarian  . Lung cancer Mother        smoker  . Arthritis Mother   . Hyperlipidemia Mother   .  Alcohol abuse Father   . Diabetes Sister   . Hypertension Sister   . Ovarian cancer Sister   . Arthritis Sister   . Depression Sister   . Hyperlipidemia Sister   . Other Maternal Grandmother        pacemaker  . Multiple sclerosis Maternal Grandfather        ?  [name] Arthritis Brother   . COPD Brother   . Heart attack Brother   . Hyperlipidemia Brother   . Stroke Brother   . Asthma Sister   . Depression Sister   . Hyperlipidemia Sister   . Breast cancer Sister   . Asthma Sister   . Depression Sister   . Asthma Paternal Uncle     SOCIAL HISTORY:  Social History   Socioeconomic History  . Marital status: Widowed    Spouse name: Not on file  . Number of children: 2  . Years of education: Not on file  . Highest education level: Not on file  Occupational History  . Occupation: retired  Tobacco Use  . Smoking status: Former Smoker  Packs/day: 1.00    Years: 30.00    Pack years: 30.00    Types: Cigarettes    Quit date: 08/27/1990    Years since quitting: 29.2  . Smokeless tobacco: Never Used  Substance and Sexual Activity  . Alcohol use: No  . Drug use: No  . Sexual activity: Not Currently    Birth control/protection: Post-menopausal    Comment: lives alone, no dietary restrictions  Other Topics Concern  . Not on file  Social History Narrative   Retired. Lives alone.    Attended some business college.    Former smoker.    Smoke alarm in the home. Wears seat balt.    Wears dentures.    Feels safe in her relationships.       Social Determinants of Health   Financial Resource Strain:   . Difficulty of Paying Living Expenses:   Food Insecurity:   . Worried About Charity fundraiser in the Last Year:   . Arboriculturist in the Last Year:   Transportation Needs:   . Film/video editor (Medical):   [name] Lack of Transportation (Non-Medical):   Physical Activity:   . Days of Exercise per Week:   . Minutes of Exercise per Session:   Stress:   . Feeling of Stress  :   Social Connections:   . Frequency of Communication with Friends and Family:   . Frequency of Social Gatherings with Friends and Family:   . Attends Religious Services:   . Active Member of Clubs or Organizations:   . Attends Archivist Meetings:   [name] Marital Status:   Intimate Partner Violence:   . Fear of Current or Ex-Partner:   . Emotionally Abused:   [name] Physically Abused:   . Sexually Abused:     ALLERGIES: Nutrasweet aspartame [aspartame] and Lisinopril  MEDICATIONS:  Current Outpatient Medications  Medication Sig Dispense Refill  . LORazepam (ATIVAN) 0.5 MG tablet Take 1 tablet (0.5 mg total) by mouth 2 (two) times daily as needed for anxiety. 60 tablet 5  . losartan (COZAAR) 50 MG tablet Take 1.5 tablets (75 mg total) by mouth daily. 135 tablet 1  . PARoxetine (PAXIL) 20 MG tablet Take 1 tablet (20 mg total) by mouth daily. 30 tablet 3  . meclizine (ANTIVERT) 25 MG tablet Take 1 tablet (25 mg total) by mouth 3 (three) times daily as needed for dizziness. (Patient not taking: Reported on 11/10/2019) 30 tablet 0  . triamcinolone cream (KENALOG) 0.1 % Apply topically 2 (two) times daily. (Patient not taking: Reported on 11/10/2019) 80 g 0   No current facility-administered medications for this encounter.    REVIEW OF SYSTEMS:  On review of systems, the patient reports that she is doing well overall. She denies any chest pain, shortness of breath, cough, fevers, chills, night sweats. She denies nausea or vomiting. She denies any new musculoskeletal or joint aches or pains. She reports intermittent dysuria, increased frequency, urgency, intermittent sensation of not emptying her bladder completely, and intermittent constipation. She recently tried a trial of Myrbetriq without significant improvement in her LUTS so she stopped taking this. She denies gross hematuria, which she notes she has not seen since July, urinary leakage, and incontinence. She also notes a ten pound  weight loss since diagnosis due to decreased appetite and stress. A complete review of systems is obtained and is otherwise negative.    PHYSICAL EXAM:  Wt Readings from Last 3 Encounters:  11/10/19 170  lb (77.1 kg)  11/04/19 169 lb 6.4 oz (76.8 kg)  10/22/19 171 lb 14.4 oz (78 kg)   Temp Readings from Last 3 Encounters:  11/04/19 (!) 97.3 F (36.3 C) (Temporal)  10/22/19 97.6 F (36.4 C)  10/07/19 98 F (36.7 C) (Temporal)   BP Readings from Last 3 Encounters:  11/04/19 134/81  10/22/19 (!) 168/75  10/07/19 136/72   Pulse Readings from Last 3 Encounters:  11/04/19 81  10/22/19 73  10/07/19 81   Pain Assessment Pain Score: 0-No pain/10  Physical exam not performed in light of telephone consult visit format.   KPS = 90  100 - Normal; no complaints; no evidence of disease. 90   - Able to carry on normal activity; minor signs or symptoms of disease. 80   - Normal activity with effort; some signs or symptoms of disease. 52   - Cares for self; unable to carry on normal activity or to do active work. 60   - Requires occasional assistance, but is able to care for most of his personal needs. 50   - Requires considerable assistance and frequent medical care. 50   - Disabled; requires special care and assistance. 44   - Severely disabled; hospital admission is indicated although death not imminent. 45   - Very sick; hospital admission necessary; active supportive treatment necessary. 10   - Moribund; fatal processes progressing rapidly. 0     - Dead  Karnofsky DA, Abelmann [place], Craver LS and Burchenal JH [phone]) The use of the nitrogen mustards in the palliative treatment of carcinoma: with particular reference to bronchogenic carcinoma Cancer 1 634-56  LABORATORY DATA:  Lab Results  Component Value Date   WBC 8.6 11/04/2019   HGB 13.5 11/04/2019   HCT 42.0 11/04/2019   MCV 97.4 11/04/2019   PLT 300 11/04/2019   Lab Results  Component Value Date   NA 140 11/04/2019    K 4.3 11/04/2019   CL 105 11/04/2019   CO2 29 11/04/2019   Lab Results  Component Value Date   ALT 9 11/04/2019   AST 12 (L) 11/04/2019   ALKPHOS 72 11/04/2019   BILITOT 0.8 11/04/2019     RADIOGRAPHY: MM 3D SCREEN BREAST BILATERAL  Result Date: 11/05/2019 CLINICAL DATA:  Screening. EXAM: DIGITAL SCREENING BILATERAL MAMMOGRAM WITH TOMO AND CAD COMPARISON:  Previous exam(s). ACR Breast Density Category c: The breast tissue is heterogeneously dense, which may obscure small masses. FINDINGS: There are no findings suspicious for malignancy. Images were processed with CAD. IMPRESSION: No mammographic evidence of malignancy. A result letter of this screening mammogram will be mailed directly to the patient. RECOMMENDATION: Screening mammogram in one year. (Code:SM-B-01Y) BI-RADS CATEGORY  1: Negative. Electronically Signed   By: Lajean Manes M.D.   On: 11/05/2019 15:40      IMPRESSION/PLAN: This visit was conducted via telephone to spare the patient unnecessary potential exposure in the healthcare setting during the current COVID-19 pandemic. 1. 79 y.o. woman with T2, high grade, muscle-invasive bladder cancer. Today, we talked to the patient and her daughter-in-law, Christina [name], about the findings and workup thus far. We discussed the natural history of muscle invasive, high grade urothelial carcinoma and general treatment, highlighting the role of radiotherapy in the management. We discussed that while radical cystectomy still remains the gold-standard of treatment in this setting, there are several studies that have shown the bladder sparing approach with concurrent chemoradiation to be nearly as effective. We discussed the available radiation techniques, and focused  on the details and logistics of delivery. We would anticipate a 36 fraction course of daily radiation concurrent with weekly chemotherapy. She is undecided as to whether she would prefer to continue her care with Dr. Marin Olp in [facility]  for systemic therapy or possibly meet with Dr. Alen Blew and keep all of her cancer care here in [facility] if she elects to proceed with chemoradiation. We reviewed the anticipated acute and late sequelae associated with radiation in this setting. She expressed preference for a bladder sparing treatment approach and thus has also sought a second opinion at [facility], which is scheduled for 11/25/2019. She is also scheduled for repeat PET scan on 11/16/19 and a consult visit with Dr. Tresa Moore on 11/17/19 to further discuss radical cystectomy. The patient and her daughter-in-law, Christina [name] were encouraged to ask questions that were answered to their stated satisfaction.  At the end of our conversation, the patient and her daughter-in-law Christina [name] are leaning towards concurrent chemoradiation. They will proceed with their consultation with Dr. Tresa Moore on 11/17/19 to further discuss radical cystectomy and a second opinion at [facility] before making a final decision. We provided our direct contact information and advised that we are happy to answer any additional questions or concerns that may arise. We would be happy to continue to participate in her care and look forward to following her progress.  Given current concerns for patient exposure during the COVID-19 pandemic, this encounter was conducted via telephone. The patient was notified in advance and was offered a [place] meeting to allow for face to face communication but unfortunately reported that he did not have the appropriate resources/technology to support such a visit and instead preferred to proceed with telephone consult. The patient has given verbal consent for this type of encounter. The time spent during this encounter was 50 minutes. The attendants for this meeting include Tyler Pita MD, Ashlyn Bruning PA-C, [place], patient, Christina [name] and her daughter-in-law Christina [name]. During the encounter, Tyler Pita MD, Ashlyn Bruning PA-C, and  scribe, Wilburn Mylar were located at [place].  Patient, Christina [name] and Christina [name] were located at home.    Nicholos Johns, PA-C    Tyler Pita, MD  [facility] Direct Dial: [phone]  Fax: [phone] [email or web address]  Skype  [email or web address]   This document serves as a record of services personally performed by Tyler Pita, MD and Freeman Caldron, PA-C. It was created on their behalf by Wilburn Mylar, a trained medical scribe. The creation of this record is based on the scribe's personal observations and the provider's statements to them. This document has been checked and approved by the attending provider.

## 2019-11-16 ENCOUNTER — Other Ambulatory Visit: Payer: Self-pay

## 2019-11-16 ENCOUNTER — Encounter (HOSPITAL_COMMUNITY)
Admission: RE | Admit: 2019-11-16 | Discharge: 2019-11-16 | Disposition: A | Discharge status: Home / Self Care | Payer: Medicare PPO | Source: Ambulatory Visit | Attending: Hematology & Oncology | Admitting: Hematology & Oncology

## 2019-11-16 ENCOUNTER — Telehealth: Payer: Self-pay | Admitting: *Deleted

## 2019-11-16 DIAGNOSIS — N3289 Other specified disorders of bladder: Secondary | ICD-10-CM | POA: Insufficient documentation

## 2019-11-16 DIAGNOSIS — N323 Diverticulum of bladder: Secondary | ICD-10-CM | POA: Diagnosis not present

## 2019-11-16 DIAGNOSIS — D3501 Benign neoplasm of right adrenal gland: Secondary | ICD-10-CM | POA: Diagnosis not present

## 2019-11-16 DIAGNOSIS — I700 Atherosclerosis of aorta: Secondary | ICD-10-CM | POA: Diagnosis not present

## 2019-11-16 DIAGNOSIS — C670 Malignant neoplasm of trigone of bladder: Secondary | ICD-10-CM | POA: Insufficient documentation

## 2019-11-16 DIAGNOSIS — Z79899 Other long term (current) drug therapy: Secondary | ICD-10-CM | POA: Insufficient documentation

## 2019-11-16 LAB — GLUCOSE, CAPILLARY: Glucose-Capillary: 115 mg/dL — ABNORMAL HIGH (ref 70–99)

## 2019-11-16 MED ORDER — FLUDEOXYGLUCOSE F - 18 (FDG) INJECTION
8.0000 | Freq: Once | INTRAVENOUS | Status: AC | PRN
  Administered 2019-11-16: 8 via INTRAVENOUS

## 2019-11-16 NOTE — Telephone Encounter (Signed)
Notified pt of PET Scan results. Pt verified understanding. No further concerns.

## 2019-11-16 NOTE — Telephone Encounter (Signed)
-----   Message from Volanda Napoleon, MD sent at 11/16/2019 11:12 AM EDT ----- Call - the PET scan does not show any obvious cancer spread!!!  As such, everything is located in the bladder!!  Laurey Arrow

## 2019-11-17 DIAGNOSIS — C672 Malignant neoplasm of lateral wall of bladder: Secondary | ICD-10-CM | POA: Diagnosis not present

## 2019-11-17 DIAGNOSIS — R300 Dysuria: Secondary | ICD-10-CM | POA: Diagnosis not present

## 2019-11-17 DIAGNOSIS — R310 Gross hematuria: Secondary | ICD-10-CM | POA: Diagnosis not present

## 2019-11-23 DIAGNOSIS — C678 Malignant neoplasm of overlapping sites of bladder: Secondary | ICD-10-CM | POA: Diagnosis not present

## 2019-11-23 DIAGNOSIS — C679 Malignant neoplasm of bladder, unspecified: Secondary | ICD-10-CM | POA: Diagnosis not present

## 2019-11-25 DIAGNOSIS — C678 Malignant neoplasm of overlapping sites of bladder: Secondary | ICD-10-CM | POA: Diagnosis not present

## 2019-11-25 DIAGNOSIS — R3989 Other symptoms and signs involving the genitourinary system: Secondary | ICD-10-CM | POA: Diagnosis not present

## 2019-11-25 DIAGNOSIS — Z79899 Other long term (current) drug therapy: Secondary | ICD-10-CM | POA: Diagnosis not present

## 2019-11-25 DIAGNOSIS — Z5181 Encounter for therapeutic drug level monitoring: Secondary | ICD-10-CM | POA: Diagnosis not present

## 2019-11-25 DIAGNOSIS — Z87891 Personal history of nicotine dependence: Secondary | ICD-10-CM | POA: Diagnosis not present

## 2019-11-25 DIAGNOSIS — C679 Malignant neoplasm of bladder, unspecified: Secondary | ICD-10-CM | POA: Diagnosis not present

## 2019-11-26 ENCOUNTER — Other Ambulatory Visit: Payer: Self-pay | Admitting: Hematology & Oncology

## 2019-11-26 DIAGNOSIS — C672 Malignant neoplasm of lateral wall of bladder: Secondary | ICD-10-CM | POA: Diagnosis not present

## 2019-12-02 ENCOUNTER — Ambulatory Visit: Payer: Medicare PPO | Admitting: Hematology & Oncology

## 2019-12-02 ENCOUNTER — Other Ambulatory Visit: Payer: Medicare PPO

## 2019-12-08 DIAGNOSIS — I517 Cardiomegaly: Secondary | ICD-10-CM | POA: Diagnosis not present

## 2019-12-08 DIAGNOSIS — C679 Malignant neoplasm of bladder, unspecified: Secondary | ICD-10-CM | POA: Diagnosis not present

## 2019-12-09 ENCOUNTER — Telehealth: Payer: Self-pay

## 2019-12-09 DIAGNOSIS — C672 Malignant neoplasm of lateral wall of bladder: Secondary | ICD-10-CM | POA: Diagnosis not present

## 2019-12-09 NOTE — Telephone Encounter (Signed)
Per [facility] notes they faxed lab orders to [facility] to have drawn at their location today. Pt was called back and she said [facility] called her this morning and said that she needed to go to her PCP to have these done. Pt does not know what to do and said she spoke with [facility] nurse and she was going to fax order to [place] and call us about getting labs done here. Pt was told we do not draw labs from other physicians and pt got upset and said she did not know what to do if nobody will draw the labs and she cannot drive to [facility] today. Pt was going to call [facility] back and she would call me back. I told her I would send message to Dr Raoul Pitch to let her know what was happening.

## 2019-12-09 NOTE — Telephone Encounter (Signed)
Received faxed lab order from Dr Silva Bandy at [place] [facility]. Placed on Dr Dierdre Highman desk to review.   Fax # for Dr Silva Bandy [phone] or [phone].

## 2019-12-09 NOTE — Telephone Encounter (Signed)
Pt returned call and said [facility] MD agreed for patient to have labs drawn at their office. Pt advised to go ahead and get the labs done at [facility] since Dr Raoul Pitch has not answered/seeing patients and she needs labs completed today. Pt agreed.

## 2019-12-09 NOTE — Telephone Encounter (Signed)
Noted. We are not a lab draw station here. With limited staff and resources we are only able to draw labs for patients beng seen by our providers.  Glad she was able to get them at [facility].

## 2019-12-09 NOTE — Telephone Encounter (Signed)
Patient called in stating she was seen by [facility] on 12/08/19 and is needing blood work done. States the office should be receiving something in fax for her to get blood work done if Dr approves. Can call patient for better understanding   Please advise

## 2019-12-10 ENCOUNTER — Encounter: Payer: Self-pay | Admitting: Urology

## 2019-12-10 NOTE — Progress Notes (Signed)
Per communication from Dr. Alyson Ingles- this patient met with Dr. Tresa Moore and Dr. Judieth Keens at [facility] and has decided to enroll in a clinical trial for cystectomy and chemotherapy.  Nicholos Johns, MMS, PA-C [place] at [place]: [phone]  Fax: [phone]

## 2019-12-14 DIAGNOSIS — C678 Malignant neoplasm of overlapping sites of bladder: Secondary | ICD-10-CM | POA: Diagnosis not present

## 2019-12-21 DIAGNOSIS — C678 Malignant neoplasm of overlapping sites of bladder: Secondary | ICD-10-CM | POA: Diagnosis not present

## 2019-12-23 DIAGNOSIS — Z5111 Encounter for antineoplastic chemotherapy: Secondary | ICD-10-CM | POA: Diagnosis not present

## 2019-12-23 DIAGNOSIS — M1990 Unspecified osteoarthritis, unspecified site: Secondary | ICD-10-CM | POA: Diagnosis not present

## 2019-12-23 DIAGNOSIS — Z86018 Personal history of other benign neoplasm: Secondary | ICD-10-CM | POA: Diagnosis not present

## 2019-12-23 DIAGNOSIS — N200 Calculus of kidney: Secondary | ICD-10-CM | POA: Diagnosis not present

## 2019-12-23 DIAGNOSIS — Z79899 Other long term (current) drug therapy: Secondary | ICD-10-CM | POA: Diagnosis not present

## 2019-12-23 DIAGNOSIS — K5790 Diverticulosis of intestine, part unspecified, without perforation or abscess without bleeding: Secondary | ICD-10-CM | POA: Diagnosis not present

## 2019-12-23 DIAGNOSIS — K219 Gastro-esophageal reflux disease without esophagitis: Secondary | ICD-10-CM | POA: Diagnosis not present

## 2019-12-23 DIAGNOSIS — C678 Malignant neoplasm of overlapping sites of bladder: Secondary | ICD-10-CM | POA: Diagnosis not present

## 2019-12-23 DIAGNOSIS — I10 Essential (primary) hypertension: Secondary | ICD-10-CM | POA: Diagnosis not present

## 2019-12-30 DIAGNOSIS — C678 Malignant neoplasm of overlapping sites of bladder: Secondary | ICD-10-CM | POA: Diagnosis not present

## 2019-12-30 IMAGING — MG DIGITAL SCREENING BILATERAL MAMMOGRAM WITH TOMO AND CAD
2 series · 3 of 6 positions shown · non-contrast
Comparison: Previous exam(s).

CLINICAL DATA: Screening.

EXAM:
DIGITAL SCREENING BILATERAL MAMMOGRAM WITH TOMO AND CAD

[L CC synth-2D]
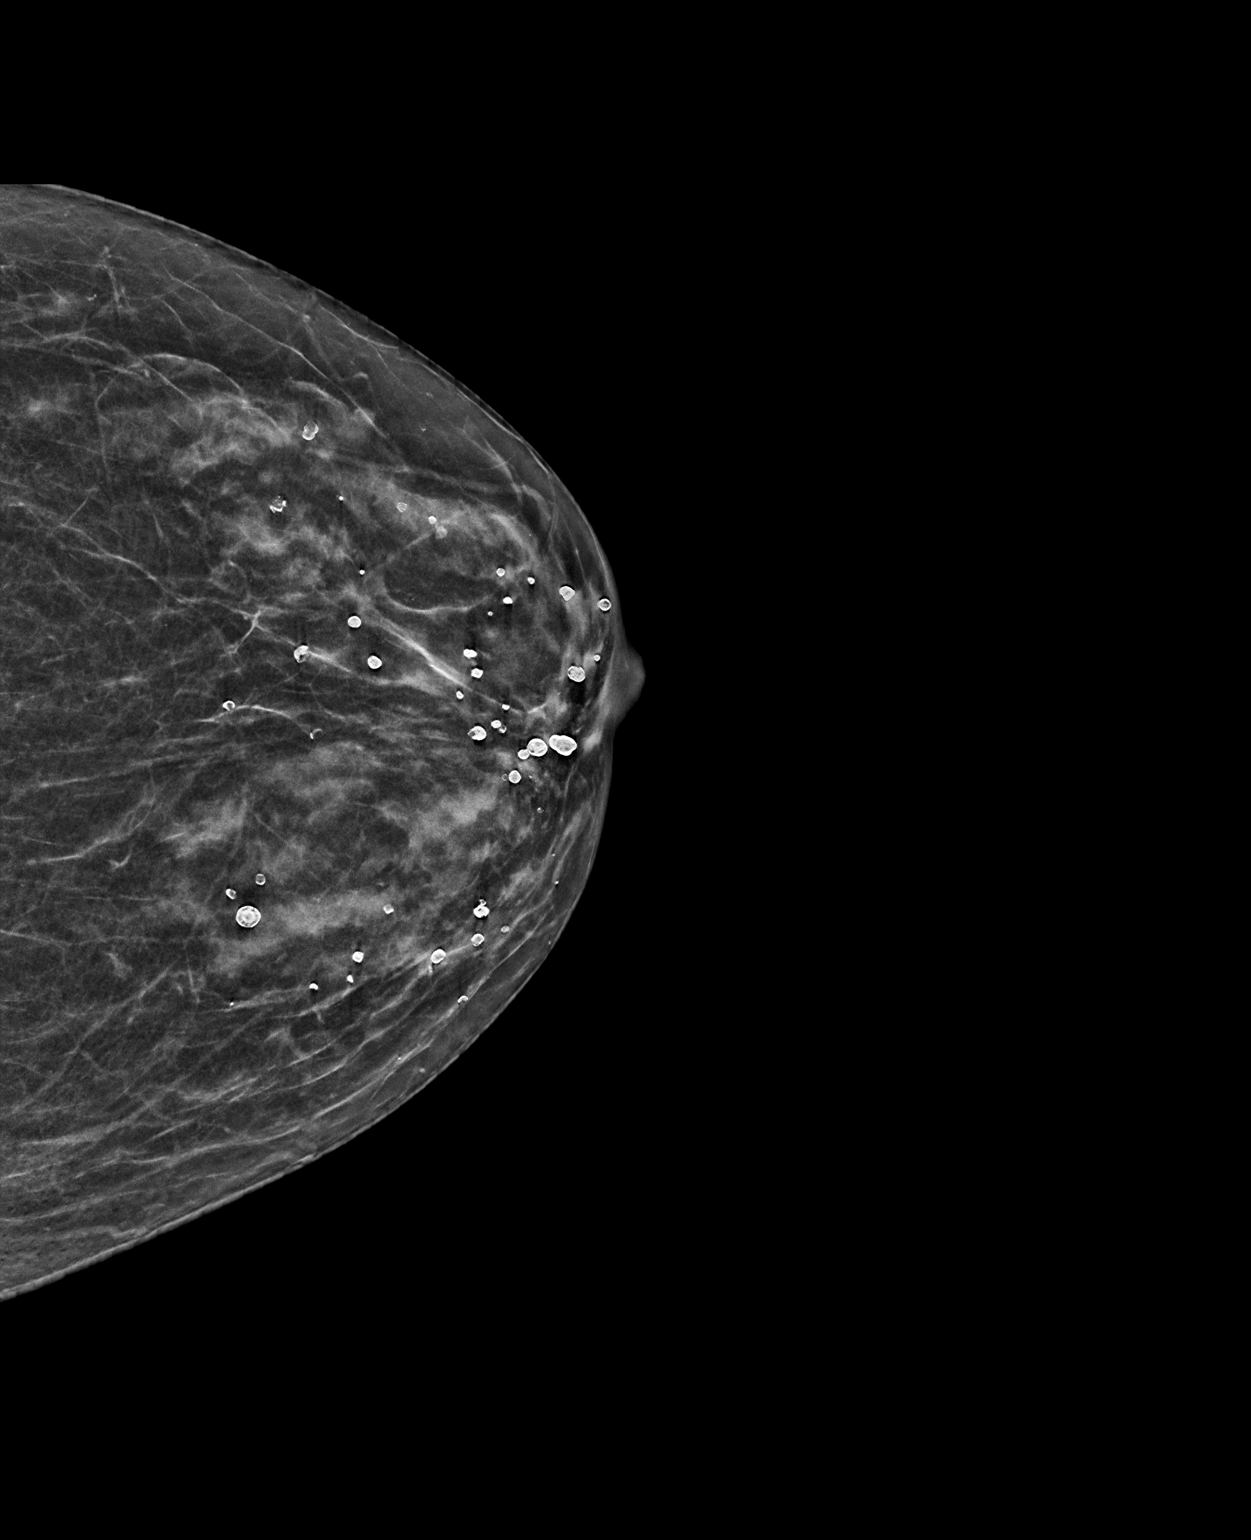

[L CC tomo · 2 of 48 frames shown]
[frame 16/48]
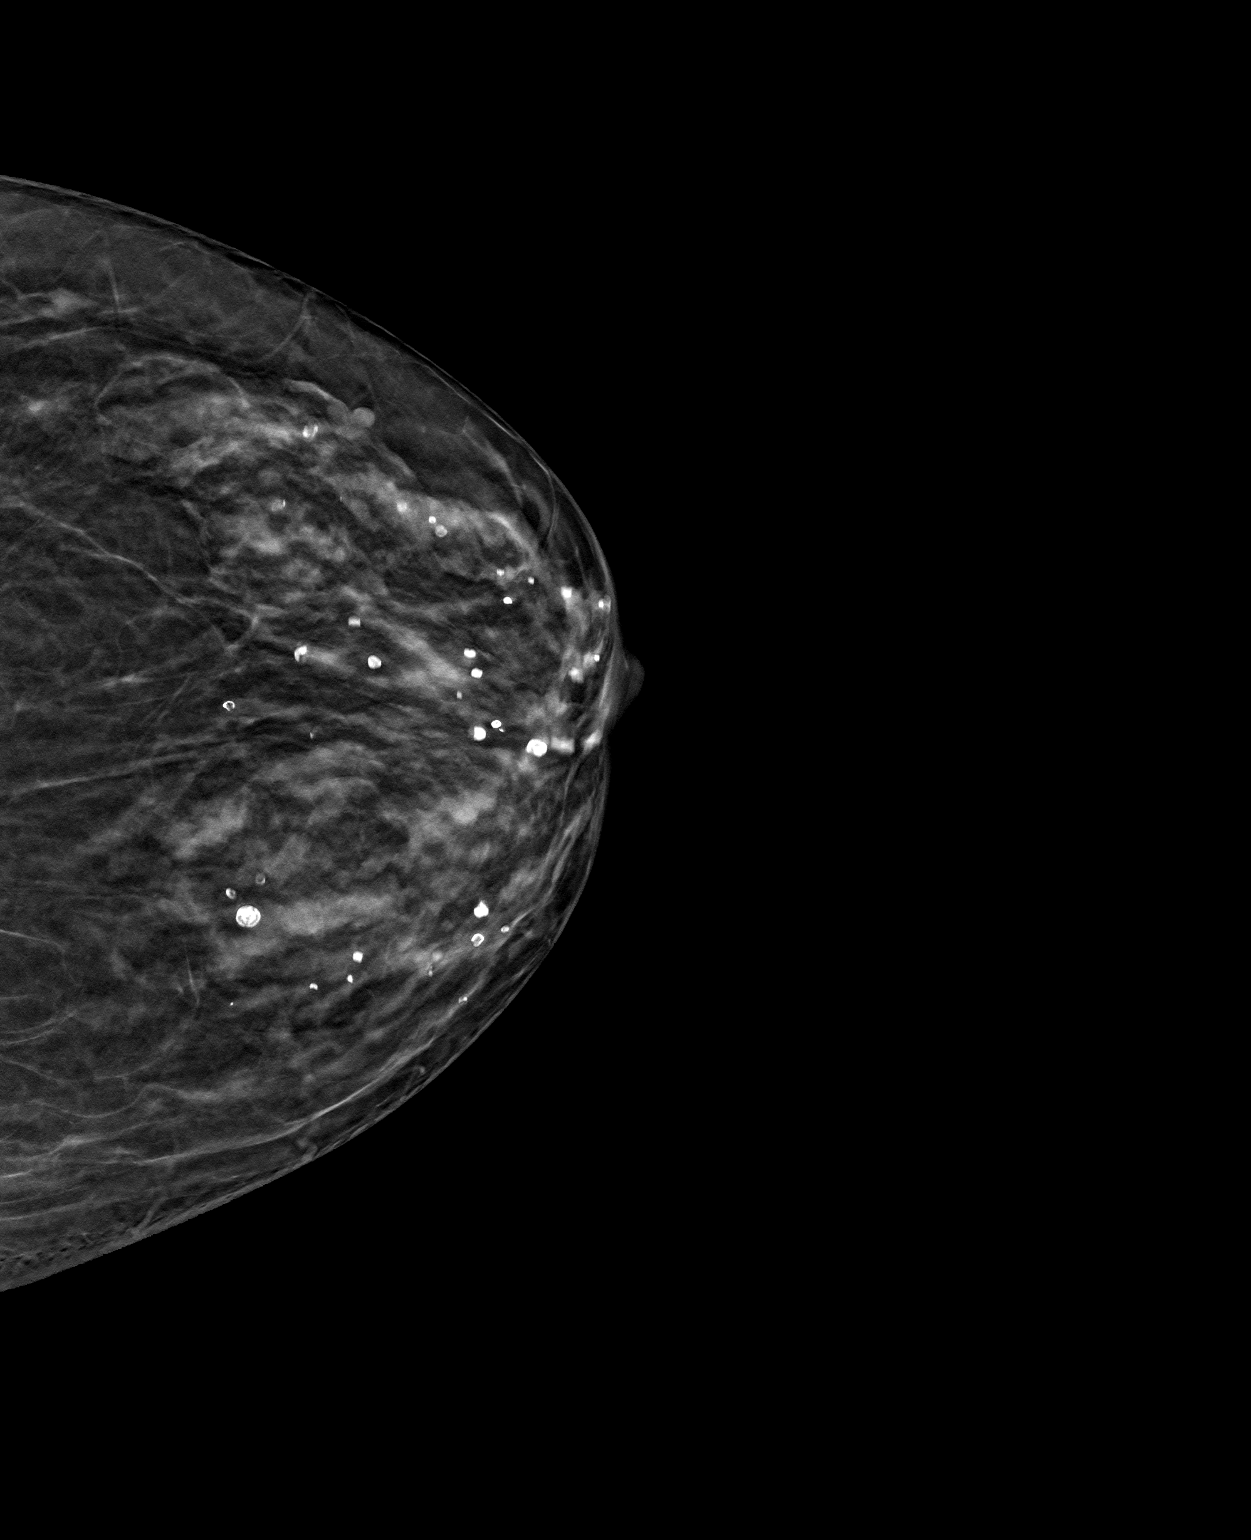
[frame 25/48]
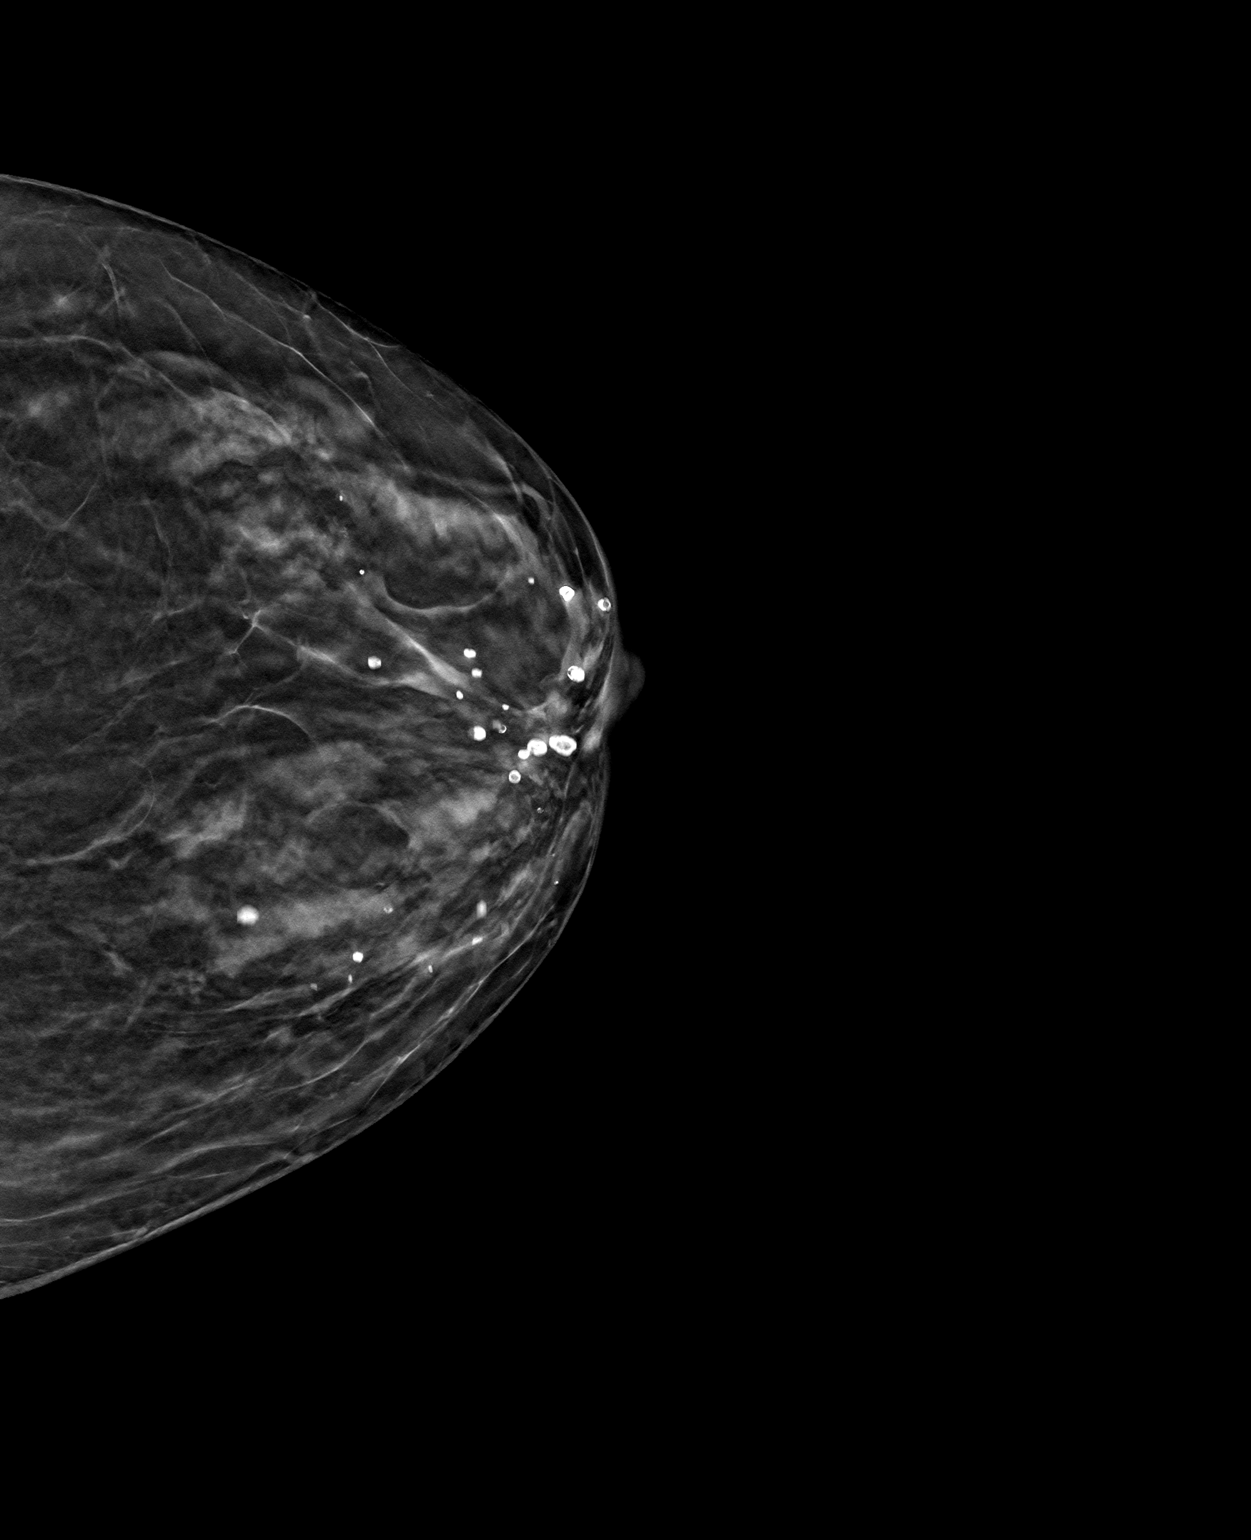

[3 of 6 positions shown; findings below may reference images not displayed]

ACR Breast Density Category c: The breast tissue is heterogeneously
dense, which may obscure small masses.
FINDINGS: There are no findings suspicious for malignancy. Images were
processed with CAD.
IMPRESSION: No mammographic evidence of malignancy. A result letter of this
screening mammogram will be mailed directly to the patient.

RECOMMENDATION:
Screening mammogram in one year. (Code:[id number])

BI-RADS CATEGORY  1: Negative.

## 2019-12-31 ENCOUNTER — Encounter: Payer: Self-pay | Admitting: *Deleted

## 2019-12-31 NOTE — Progress Notes (Signed)
Patient calling to coordinate care with this office. She currently is treated every two week at [facility]. In between treatments she needs IVFs and lab work. She would like to know if she can get this at this location. Confirmed with patient that we would be happy to help her. Gave her our fax number and asked that she have Dr Silva Bandy office send [place] instructions on what they would like done, and we can schedule her here. Her first treatment is due 01/13/20.   Sent messages to Dr Marin Olp, Desk RN and Scheduler.

## 2020-01-05 ENCOUNTER — Encounter: Payer: Self-pay | Admitting: *Deleted

## 2020-01-05 NOTE — Progress Notes (Signed)
Patient following up with her request to get IVF at this location every other Wednesday between treatments. We still haven't heard from [facility] therefor patient had not yet been scheduled.  Received a call from [place]. They are requesting that we take the lead with her every other Wednesday visits. They haven't done anything other than 1L NS with a CBC and CMP.  Spoke to Dr Marin Olp. We will proceed with scheduling patient every other Wednesday for lab check and 1L NS starting 01/13/20. Message sent to scheduler.

## 2020-01-06 DIAGNOSIS — Z87891 Personal history of nicotine dependence: Secondary | ICD-10-CM | POA: Diagnosis not present

## 2020-01-06 DIAGNOSIS — C678 Malignant neoplasm of overlapping sites of bladder: Secondary | ICD-10-CM | POA: Diagnosis not present

## 2020-01-06 DIAGNOSIS — R519 Headache, unspecified: Secondary | ICD-10-CM | POA: Diagnosis not present

## 2020-01-06 DIAGNOSIS — Z5111 Encounter for antineoplastic chemotherapy: Secondary | ICD-10-CM | POA: Diagnosis not present

## 2020-01-08 ENCOUNTER — Other Ambulatory Visit: Payer: Self-pay | Admitting: Hematology and Oncology

## 2020-01-08 ENCOUNTER — Ambulatory Visit: Payer: Medicare PPO | Admitting: Family Medicine

## 2020-01-08 ENCOUNTER — Encounter: Payer: Self-pay | Admitting: *Deleted

## 2020-01-08 DIAGNOSIS — R519 Headache, unspecified: Secondary | ICD-10-CM

## 2020-01-08 NOTE — Progress Notes (Signed)
Patient is cancelling her 01/13/20 IVF appointment. She has been scheduled for an ECHO at [facility] on Wednesday and since she will be at that location, she will get her fluids there.   Appointment cancelled. She will keep [place] updated to need on further appointments.

## 2020-01-12 ENCOUNTER — Other Ambulatory Visit: Payer: Self-pay | Admitting: *Deleted

## 2020-01-12 DIAGNOSIS — C670 Malignant neoplasm of trigone of bladder: Secondary | ICD-10-CM

## 2020-01-12 DIAGNOSIS — C673 Malignant neoplasm of anterior wall of bladder: Secondary | ICD-10-CM

## 2020-01-13 ENCOUNTER — Other Ambulatory Visit: Payer: Self-pay | Admitting: Family

## 2020-01-13 ENCOUNTER — Inpatient Hospital Stay: Payer: Medicare PPO

## 2020-01-13 ENCOUNTER — Telehealth: Payer: Self-pay | Admitting: *Deleted

## 2020-01-13 ENCOUNTER — Inpatient Hospital Stay: Payer: Medicare PPO | Attending: Hematology & Oncology

## 2020-01-13 ENCOUNTER — Other Ambulatory Visit: Payer: Self-pay

## 2020-01-13 VITALS — BP 111/59 | HR 77 | Temp 97.5°F | Resp 18

## 2020-01-13 DIAGNOSIS — E876 Hypokalemia: Secondary | ICD-10-CM | POA: Diagnosis not present

## 2020-01-13 DIAGNOSIS — Z95828 Presence of other vascular implants and grafts: Secondary | ICD-10-CM

## 2020-01-13 DIAGNOSIS — I517 Cardiomegaly: Secondary | ICD-10-CM | POA: Diagnosis not present

## 2020-01-13 DIAGNOSIS — C672 Malignant neoplasm of lateral wall of bladder: Secondary | ICD-10-CM | POA: Insufficient documentation

## 2020-01-13 DIAGNOSIS — E860 Dehydration: Secondary | ICD-10-CM | POA: Insufficient documentation

## 2020-01-13 DIAGNOSIS — C670 Malignant neoplasm of trigone of bladder: Secondary | ICD-10-CM

## 2020-01-13 DIAGNOSIS — C673 Malignant neoplasm of anterior wall of bladder: Secondary | ICD-10-CM

## 2020-01-13 DIAGNOSIS — C678 Malignant neoplasm of overlapping sites of bladder: Secondary | ICD-10-CM | POA: Diagnosis not present

## 2020-01-13 DIAGNOSIS — Z79899 Other long term (current) drug therapy: Secondary | ICD-10-CM | POA: Diagnosis not present

## 2020-01-13 LAB — CBC WITH DIFFERENTIAL (CANCER CENTER ONLY)
Abs Immature Granulocytes: 0 10*3/uL (ref 0.00–0.07)
Basophils Absolute: 0 10*3/uL (ref 0.0–0.1)
Basophils Relative: 0 %
Eosinophils Absolute: 0 10*3/uL (ref 0.0–0.5)
Eosinophils Relative: 0 %
HCT: 30.7 % — ABNORMAL LOW (ref 36.0–46.0)
Hemoglobin: 10.4 g/dL — ABNORMAL LOW (ref 12.0–15.0)
Lymphocytes Relative: 75 %
Lymphs Abs: 0.8 10*3/uL (ref 0.7–4.0)
MCH: 32.1 pg (ref 26.0–34.0)
MCHC: 33.9 g/dL (ref 30.0–36.0)
MCV: 94.8 fL (ref 80.0–100.0)
Monocytes Absolute: 0 10*3/uL — ABNORMAL LOW (ref 0.1–1.0)
Monocytes Relative: 1 %
Neutro Abs: 0.2 10*3/uL — CL (ref 1.7–7.7)
Neutrophils Relative %: 24 %
Platelet Count: 69 10*3/uL — ABNORMAL LOW (ref 150–400)
RBC: 3.24 MIL/uL — ABNORMAL LOW (ref 3.87–5.11)
RDW: 12.8 % (ref 11.5–15.5)
WBC Count: 1 10*3/uL — ABNORMAL LOW (ref 4.0–10.5)
nRBC: 0 % (ref 0.0–0.2)

## 2020-01-13 LAB — CMP (CANCER CENTER ONLY)
ALT: 19 U/L (ref 0–44)
AST: 15 U/L (ref 15–41)
Albumin: 3.6 g/dL (ref 3.5–5.0)
Alkaline Phosphatase: 73 U/L (ref 38–126)
Anion gap: 9 (ref 5–15)
BUN: 25 mg/dL — ABNORMAL HIGH (ref 8–23)
CO2: 31 mmol/L (ref 22–32)
Calcium: 9.2 mg/dL (ref 8.9–10.3)
Chloride: 98 mmol/L (ref 98–111)
Creatinine: 0.98 mg/dL (ref 0.44–1.00)
GFR, Est AFR Am: >60 mL/min (ref >60)
GFR, Estimated: 55 mL/min — ABNORMAL LOW (ref >60)
Glucose, Bld: 151 mg/dL — ABNORMAL HIGH (ref 70–99)
Potassium: 3 mmol/L — CL (ref 3.5–5.1)
Sodium: 138 mmol/L (ref 135–145)
Total Bilirubin: 1.2 mg/dL (ref 0.3–1.2)
Total Protein: 5.8 g/dL — ABNORMAL LOW (ref 6.5–8.1)

## 2020-01-13 MED ORDER — POTASSIUM CHLORIDE CRYS ER 20 MEQ PO TBCR: 40.0000 meq | EXTENDED_RELEASE_TABLET | Freq: Every day | ORAL | 0 refills | Status: DC

## 2020-01-13 MED ORDER — HEPARIN SOD (PORK) LOCK FLUSH 100 UNIT/ML IV SOLN
500.0000 [IU] | Freq: Once | INTRAVENOUS | Status: AC
  Administered 2020-01-13: 500 [IU] via INTRAVENOUS
  Filled 2020-01-13: qty 5

## 2020-01-13 MED ORDER — SODIUM CHLORIDE 0.9 % IV SOLN
1000.0000 mL | Freq: Once | INTRAVENOUS | Status: DC
  Administered 2020-01-13: 1000 mL via INTRAVENOUS
  Filled 2020-01-13: qty 1000

## 2020-01-13 MED ORDER — SODIUM CHLORIDE 0.9% FLUSH
10.0000 mL | INTRAVENOUS | Status: DC | PRN
  Administered 2020-01-13: 10 mL via INTRAVENOUS
  Filled 2020-01-13: qty 10

## 2020-01-13 NOTE — Patient Instructions (Signed)

## 2020-01-13 NOTE — Patient Instructions (Signed)
Dehydration, Adult Dehydration is a condition in which there is not enough water or other fluids in the body. This happens when a person loses more fluids than he or she takes in. Important organs, such as the kidneys, brain, and heart, cannot function without a proper amount of fluids. Any loss of fluids from the body can lead to dehydration. Dehydration can be mild, moderate, or severe. It should be treated right away to prevent it from becoming severe. What are the causes? Dehydration may be caused by:  Conditions that cause loss of water or other fluids, such as diarrhea, vomiting, or sweating or urinating a lot.  Not drinking enough fluids, especially when you are ill or doing activities that require a lot of energy.  Other illnesses and conditions, such as fever or infection.  Certain medicines, such as medicines that remove excess fluid from the body (diuretics).  Lack of safe drinking water.  Not being able to get enough water and food. What increases the risk? The following factors may make you more likely to develop this condition:  Having a long-term (chronic) illness that has not been treated properly, such as diabetes, heart disease, or kidney disease.  Being 65 years of age or older.  Having a disability.  Living in a place that is high in altitude, where thinner, drier air causes more fluid loss.  Doing exercises that put stress on your body for a long time (endurance sports). What are the signs or symptoms? Symptoms of dehydration depend on how severe it is. Mild or moderate dehydration  Thirst.  Dry lips or dry mouth.  Dizziness or light-headedness, especially when standing up from a seated position.  Muscle cramps.  Dark urine. Urine may be the color of tea.  Less urine or tears produced than usual.  Headache. Severe dehydration  Changes in skin. Your skin may be cold and clammy, blotchy, or pale. Your skin also may not return to normal after being  lightly pinched and released.  Little or no tears, urine, or sweat.  Changes in vital signs, such as rapid breathing and low blood pressure. Your pulse may be weak or may be faster than 100 beats a minute when you are sitting still.  Other changes, such as: ? Feeling very thirsty. ? Sunken eyes. ? Cold hands and feet. ? Confusion. ? Being very tired (lethargic) or having trouble waking from sleep. ? Short-term weight loss. ? Loss of consciousness. How is this diagnosed? This condition is diagnosed based on your symptoms and a physical exam. You may have blood and urine tests to help confirm the diagnosis. How is this treated? Treatment for this condition depends on how severe it is. Treatment should be started right away. Do not wait until dehydration becomes severe. Severe dehydration is an emergency and needs to be treated in a hospital.  Mild or moderate dehydration can be treated at home. You may be asked to: ? Drink more fluids. ? Drink an oral rehydration solution (ORS). This drink helps restore proper amounts of fluids and salts and minerals in the blood (electrolytes).  Severe dehydration can be treated: ? With IV fluids. ? By correcting abnormal levels of electrolytes. This is often done by giving electrolytes through a tube that is passed through your nose and into your stomach (nasogastric tube, or NG tube). ? By treating the underlying cause of dehydration. Follow these instructions at home: Oral rehydration solution If told by your health care provider, drink an ORS:  Make   an ORS by following instructions on the package.  Start by drinking small amounts, about  cup (120 mL) every 5-10 minutes.  Slowly increase how much you drink until you have taken the amount recommended by your health care provider. Eating and drinking         Drink enough clear fluid to keep your urine pale yellow. If you were told to drink an ORS, finish the ORS first and then start slowly  drinking other clear fluids. Drink fluids such as: ? Water. Do not drink only water. Doing that can lead to hyponatremia, which is having too little salt (sodium) in the body. ? Water from ice chips you suck on. ? Fruit juice that you have added water to (diluted fruit juice). ? Low-calorie sports drinks.  Eat foods that contain a healthy balance of electrolytes, such as bananas, oranges, potatoes, tomatoes, and spinach.  Do not drink alcohol.  Avoid the following: ? Drinks that contain a lot of sugar. These include high-calorie sports drinks, fruit juice that is not diluted, and soda. ? Caffeine. ? Foods that are greasy or contain a lot of fat or sugar. General instructions  Take over-the-counter and prescription medicines only as told by your health care provider.  Do not take sodium tablets. Doing that can lead to having too much sodium in the body (hypernatremia).  Return to your normal activities as told by your health care provider. Ask your health care provider what activities are safe for you.  Keep all follow-up visits as told by your health care provider. This is important. Contact a health care provider if:  You have muscle cramps, pain, or discomfort, such as: ? Pain in your abdomen and the pain gets worse or stays in one area (localizes). ? Stiff neck.  You have a rash.  You are more irritable than usual.  You are sleepier or have a harder time waking than usual.  You feel weak or dizzy.  You feel very thirsty. Get help right away if you have:  Any symptoms of severe dehydration.  Symptoms of vomiting, such as: ? You cannot eat or drink without vomiting. ? Vomiting gets worse or does not go away. ? Vomit includes blood or green matter (bile).  Symptoms that get worse with treatment.  A fever.  A severe headache.  Problems with urination or bowel movements, such as: ? Diarrhea that gets worse or does not go away. ? Blood in your stool (feces). This  may cause stool to look black and tarry. ? Not urinating, or urinating only a small amount of very dark urine, within 6-8 hours.  Trouble breathing. These symptoms may represent a serious problem that is an emergency. Do not wait to see if the symptoms will go away. Get medical help right away. Call your local emergency services (911 in the [place] Do not drive yourself to the hospital. Summary  Dehydration is a condition in which there is not enough water or other fluids in the body. This happens when a person loses more fluids than he or she takes in.  Treatment for this condition depends on how severe it is. Treatment should be started right away. Do not wait until dehydration becomes severe.  Drink enough clear fluid to keep your urine pale yellow. If you were told to drink an oral rehydration solution (ORS), finish the ORS first and then start slowly drinking other clear fluids.  Take over-the-counter and prescription medicines only as told by your health care   provider.  Get help right away if you have any symptoms of severe dehydration. This information is not intended to replace advice given to you by your health care provider. Make sure you discuss any questions you have with your health care provider. Document Revised: 03/26/2019 Document Reviewed: 03/26/2019 Elsevier Patient Education  2020 [employer] Inc[employer]   

## 2020-01-13 NOTE — Telephone Encounter (Signed)
Critical potassium of 3.0  And ANC of .2 reported by Anderson Malta in lab.  Laverna Peace NP notified.

## 2020-01-14 ENCOUNTER — Telehealth (INDEPENDENT_AMBULATORY_CARE_PROVIDER_SITE_OTHER): Payer: Medicare PPO | Admitting: Family Medicine

## 2020-01-14 ENCOUNTER — Encounter: Payer: Self-pay | Admitting: Family Medicine

## 2020-01-14 ENCOUNTER — Other Ambulatory Visit (HOSPITAL_BASED_OUTPATIENT_CLINIC_OR_DEPARTMENT_OTHER): Payer: Self-pay | Admitting: Hematology and Oncology

## 2020-01-14 VITALS — Ht 65.25 in

## 2020-01-14 DIAGNOSIS — F418 Other specified anxiety disorders: Secondary | ICD-10-CM | POA: Diagnosis not present

## 2020-01-14 DIAGNOSIS — R519 Headache, unspecified: Secondary | ICD-10-CM

## 2020-01-14 MED ORDER — LORAZEPAM 0.5 MG PO TABS: 0.5000 mg | ORAL_TABLET | Freq: Two times a day (BID) | ORAL | 5 refills | Status: DC | PRN

## 2020-01-14 MED ORDER — LOSARTAN POTASSIUM 50 MG PO TABS: 75.0000 mg | ORAL_TABLET | Freq: Every day | ORAL | 1 refills | Status: DC

## 2020-01-14 NOTE — Progress Notes (Signed)
Telephone VISIT  Pt was scheduled virtually and did not feel she would be capable of a virtual visit.  I connected with [name] on 01/14/20 at 11:00 AM EDT by telephone and verified that I am speaking with the correct person using two identifiers. Location patient: Home Location provider: [facility], Office Persons participating in the virtual visit: Patient, Dr. Raoul Pitch and R.Baker, LPN  I discussed the limitations of evaluation and management by telemedicine and the availability of in person appointments. The patient expressed understanding and agreed to proceed.    Patient ID: TAFFIE Christina [name], female  DOB: 04/24/1941, 79 y.o.   MRN: [id number] Patient Care Team    Relationship Specialty Notifications Start End  Ma Hillock, DO PCP - General Family Medicine  09/02/17   Rolm Bookbinder, MD Consulting Physician General Surgery  10/05/16   Monna Fam, MD Consulting Physician Ophthalmology  10/05/16   Garry Heater, DDS Consulting Physician Dentistry  10/05/16   Juanita Craver, MD Consulting Physician Gastroenterology  09/02/17   Volanda Napoleon, MD Medical Oncologist Oncology  05/14/19     Chief Complaint  Patient presents with  . Hypertension  . Anxiety    Subjective: NIJAY MCAMIS is a 79 y.o.  Female  present for [facility] Situational anxiety:  Pt presents for an OV with complaints of increased anxiety secondary to new diagnosis of bladder cancer.    She has been using the Ativan twice daily as needed and feels it is helpful.  However she feels she has more of a constant anxiety now and is agreeable to try an everyday medicine.    Hypertension/overweight/HLD: Pt reportscompliancewith losartan 50 mg QD. Blood pressures ranges at home are not routinely checked. Patient denies chest pain, shortness of breath, dizziness or lower extremity edema. .  Pt does not take a daily baby ASA. Pt is not prescribed statin. She does take fish oil supplement. Mild  hyperlipidemia 2015. Reviewed cbc and CMP from heme-onc.  TSH: 02/2018 WNL A1c: 5.7 02/2018 Lipid: 09/04/2017 total cholesterol 183, HDL 64, LDL 103, triglycerides 81 Diet: low sodium Exercise: routine RF: HTN, mild HLD, BMI > 25 former smoker   Depression screen [facility] 2/9 01/14/2020 09/16/2019 06/05/2018 03/06/2018 12/17/2017  Decreased Interest 0 0 0 0 0  Down, Depressed, Hopeless 0 1 0 0 0  PHQ - 2 Score 0 1 0 0 0  Altered sleeping - - - - -  Tired, decreased energy - - - - -  Change in appetite - - - - -  Feeling bad or failure about yourself  - - - - -  Trouble concentrating - - - - -  Moving slowly or fidgety/restless - - - - -  Suicidal thoughts - - - - -  PHQ-9 Score - - - - -   GAD 7 : Generalized Anxiety Score 01/14/2020 09/16/2019  Nervous, Anxious, on Edge 0 0  Control/stop worrying 0 3  Worry too much - different things 0 3  Trouble relaxing 0 0  Restless 0 3  Easily annoyed or irritable 0 0  Afraid - awful might happen 0 0  Total GAD 7 Score 0 9  Anxiety Difficulty Not difficult at all Not difficult at all   Immunization History  Administered Date(s) Administered  . Influenza Whole 06/07/2009, 05/28/2011, 05/28/2012  . Influenza, High Dose Seasonal PF 05/20/2015  . Influenza,inj,Quad PF,6+ Mos 05/26/2013, 05/31/2014  . Moderna SARS-COVID-2 Vaccination 12/11/2019  . Pneumococcal Conjugate-13 05/31/2014  .  Pneumococcal Polysaccharide-23 06/07/2009  . Td 01/31/2003  . Tdap 09/28/2011    Past Medical History:  Diagnosis Date  . Arthritis   . Asthma    a little?, no problems in several years  . Bladder cancer ([place])   . Bladder tumor   . Cervical cancer screening 01/31/2012  . Chicken pox as a child  . Degenerative tear of acetabular labrum of left hip 02/26/2019  . Depression with anxiety 06/07/2009   Qualifier: Diagnosis of  By: [name] MD, [name]    . Dermatitis of external ear 07/11/2012  . Diverticulosis   . Hiatal hernia with gastroesophageal reflux  10/26/2010   Qualifier: Diagnosis of  By: [name] MD, [name]    . Hyperglycemia 06/21/2013  . Hyperlipidemia, mixed 10/16/2015   pt unaware  . Hypertension   . Left hip pain 07/10/2015  . Lesion of breast 12/03/2016   benign; resolved  . Low back pain 05/29/2015  . Measles as a child  . Mumps as a child  . Osteopenia 01/03/2017  . Overweight (BMI 25.0-29.9) 10/07/2008   Qualifier: Diagnosis of  By: [name] MD, [name]    . Palpitations    several years ago, not currently  . Pre-diabetes   . Psoriasis    ears  . RUQ pain 05/29/2015  . Spider veins    Bilateral legs  . Spinal stenosis   . Umbilical hernia   . Urinary frequency 09/28/2011  . Vertigo   . Wears dentures    Upper,   Allergies  Allergen Reactions  . Nutrasweet Aspartame [Aspartame] Diarrhea  . Lisinopril Cough   Past Surgical History:  Procedure Laterality Date  . ABDOMINAL SURGERY  1970's  . BREAST BIOPSY Right 1980   BREAST EXCISIONAL BIOPSY  . BREAST BIOPSY Left 1970   BREAST EXCISIONAL BIOPSY  . BREAST CYST ASPIRATION    . CATARACT EXTRACTION, BILATERAL    . COLONOSCOPY    . CYSTECTOMY     abdomen  . CYSTOSCOPY W/ RETROGRADES Bilateral 04/27/2019   Procedure: CYSTOSCOPY WITH RETROGRADE PYELOGRAM;  Surgeon: Cleon Gustin, MD;  Location: [facility];  Service: Urology;  Laterality: Bilateral;  . EYE SURGERY Bilateral    cataract  . INSERTION OF MESH N/A 06/11/2017   Procedure: INSERTION OF MESH;  Surgeon: Rolm Bookbinder, MD;  Location: [facility];  Service: General;  Laterality: N/A;  BILATERAL TAP BLOCK  . TRANSURETHRAL RESECTION OF BLADDER TUMOR N/A 04/27/2019   Procedure: TRANSURETHRAL RESECTION OF BLADDER TUMOR (TURBT);  Surgeon: Cleon Gustin, MD;  Location: [facility];  Service: Urology;  Laterality: N/A;  1 HR  . TRANSURETHRAL RESECTION OF BLADDER TUMOR N/A 06/01/2019   Procedure: TRANSURETHRAL RESECTION OF BLADDER TUMOR (TURBT);  Surgeon: Cleon Gustin, MD;  Location: [facility];  Service: Urology;  Laterality: N/A;  1 HR  . TRANSURETHRAL RESECTION OF BLADDER TUMOR N/A 10/22/2019   Procedure: TRANSURETHRAL RESECTION OF BLADDER TUMOR (TURBT);  Surgeon: Cleon Gustin, MD;  Location: [facility];  Service: Urology;  Laterality: N/A;  . TUBAL LIGATION  1970  . VENTRAL HERNIA REPAIR N/A 06/11/2017   Procedure: LAPAROSCOPIC VENTRAL HERNIA REPAIR WITH MESH ERAS PATHWAY;  Surgeon: Rolm Bookbinder, MD;  Location: [place];  Service: General;  Laterality: N/A;  BILATERAL TAP BLOCK   Family History  Problem Relation Age of Onset  . Hypertension Mother   . Anxiety disorder Mother   . Ovarian cancer Mother 11  lung- smoker, ovarian  . Lung cancer Mother        smoker  . Arthritis Mother   . Hyperlipidemia Mother   . Alcohol abuse Father   . Diabetes Sister   . Hypertension Sister   . Ovarian cancer Sister   . Arthritis Sister   . Depression Sister   . Hyperlipidemia Sister   . Other Maternal Grandmother        pacemaker  . Multiple sclerosis Maternal Grandfather        ?  [name] Arthritis Brother   . COPD Brother   . Heart attack Brother   . Hyperlipidemia Brother   . Stroke Brother   . Asthma Sister   . Depression Sister   . Hyperlipidemia Sister   . Breast cancer Sister   . Asthma Sister   . Depression Sister   . Asthma Paternal Uncle    Social History   Social History Narrative   Retired. Lives alone.    Attended some business college.    Former smoker.    Smoke alarm in the home. Wears seat balt.    Wears dentures.    Feels safe in her relationships.        Allergies as of 01/14/2020      Reactions   Nutrasweet Aspartame [aspartame] Diarrhea   Lisinopril Cough      Medication List       Accurate as of Jan 14, 2020 11:27 AM. If you have any questions, ask your nurse or doctor.        dexamethasone 4 MG tablet Commonly known as: DECADRON Take 2 tablets (8 mg) by  mouth daily for 3 days following chemotherapy.   First-Mouthwash BLM Susp Swish and spit 5 mLs every 6 (six) hours as needed   LORazepam 0.5 MG tablet Commonly known as: ATIVAN Take 1 tablet (0.5 mg total) by mouth 2 (two) times daily as needed for anxiety.   losartan 50 MG tablet Commonly known as: COZAAR Take 1.5 tablets (75 mg total) by mouth daily.   meclizine 25 MG tablet Commonly known as: ANTIVERT Take 1 tablet (25 mg total) by mouth 3 (three) times daily as needed for dizziness.   ondansetron 8 MG tablet Commonly known as: ZOFRAN Take 1 tablet BID for 3 days following each chemotherapy treatment and then every 8 hours as needed for nausea/vomiting   oxyCODONE 5 MG immediate release tablet Commonly known as: Oxy IR/ROXICODONE Take by mouth.   PARoxetine 20 MG tablet Commonly known as: PAXIL TAKE 1 TABLET BY MOUTH EVERY DAY   potassium chloride SA 20 MEQ tablet Commonly known as: KLOR-CON Take 2 tablets (40 mEq total) by mouth daily.   triamcinolone cream 0.1 % Commonly known as: KENALOG Apply topically 2 (two) times daily.      All past medical history, surgical history, allergies, family history, immunizations andmedications were updated in the EMR today and reviewed under the history and medication portions of their EMR.     No results found.  ROS: 14 pt review of systems performed and negative (unless mentioned in an HPI)  Objective: Ht 5' 5.25" (1.657 m)   BMI 28.07 kg/m  Gen: Afebrile.  hyromegaly Chest: no cough. No hoarseness.  Neuro: Alert. Oriented.  Psych: Normal affect, dress and demeanor. Normal speech. Normal thought content and judgment..   No exam data present  Assessment/plan: CALII MCMANIGLE is a 79 y.o. female present for CPE Essential hypertension, benign/Overweight (BMI 25.0-29.9) - stable per EMR  review. Pt is not feeling well today after her chemo 2 days ago and low potassium.  - continue losartan 75 mg. I have her  monitoring her BP and told her to hold losartan if BP < 110 -f/u 2-3 mos in person.   Malignant neoplasm of trigone of urinary bladder (HCC) Under the care of urology/oncology. Undergoing chemo. Last chemo next week. Currently not feeling well after her last chemo 2 days ago. She is in touch with her oncologist and they have started potassium supplement. Her potassium was 3.0. Until today she has reports she has tolerated her treatments rather well.  Reviewed CBC completed by oncology.  Situational anxiety She feels her medications are working well. Continue paxil- prescribed by Onc. > certainly can take after refills for her when needed.   Continue Ativan twice daily as needed refilled for her.   [place] controlled substance database reviewed 01/14/20.   Follow up about 2-3 months in person- gives her time to heal and complete treatments.   No orders of the defined types were placed in this encounter.  Meds ordered this encounter  Medications  . losartan (COZAAR) 50 MG tablet    Sig: Take 1.5 tablets (75 mg total) by mouth daily.    Dispense:  135 tablet    Refill:  1  . LORazepam (ATIVAN) 0.5 MG tablet    Sig: Take 1 tablet (0.5 mg total) by mouth 2 (two) times daily as needed for anxiety.    Dispense:  60 tablet    Refill:  5    15 minutes spent on telephone encounter.   Electronically signed by: Howard Pouch, DO [place]

## 2020-01-16 ENCOUNTER — Ambulatory Visit (HOSPITAL_BASED_OUTPATIENT_CLINIC_OR_DEPARTMENT_OTHER)
Admission: RE | Admit: 2020-01-16 | Discharge: 2020-01-16 | Disposition: A | Discharge status: Home / Self Care | Payer: Medicare PPO | Source: Ambulatory Visit | Attending: Hematology and Oncology | Admitting: Hematology and Oncology

## 2020-01-16 ENCOUNTER — Other Ambulatory Visit: Payer: Self-pay

## 2020-01-16 DIAGNOSIS — R519 Headache, unspecified: Secondary | ICD-10-CM | POA: Insufficient documentation

## 2020-01-16 DIAGNOSIS — R930 Abnormal findings on diagnostic imaging of skull and head, not elsewhere classified: Secondary | ICD-10-CM | POA: Insufficient documentation

## 2020-01-16 MED ORDER — GADOBUTROL 1 MMOL/ML IV SOLN
7.5000 mL | Freq: Once | INTRAVENOUS | Status: AC | PRN
  Administered 2020-01-16: 7.5 mL via INTRAVENOUS

## 2020-01-19 DIAGNOSIS — D630 Anemia in neoplastic disease: Secondary | ICD-10-CM | POA: Diagnosis not present

## 2020-01-19 DIAGNOSIS — T451X5A Adverse effect of antineoplastic and immunosuppressive drugs, initial encounter: Secondary | ICD-10-CM | POA: Diagnosis not present

## 2020-01-19 DIAGNOSIS — E860 Dehydration: Secondary | ICD-10-CM | POA: Insufficient documentation

## 2020-01-19 DIAGNOSIS — K1231 Oral mucositis (ulcerative) due to antineoplastic therapy: Secondary | ICD-10-CM | POA: Diagnosis not present

## 2020-01-19 DIAGNOSIS — Z79899 Other long term (current) drug therapy: Secondary | ICD-10-CM | POA: Diagnosis not present

## 2020-01-19 DIAGNOSIS — R519 Headache, unspecified: Secondary | ICD-10-CM | POA: Diagnosis not present

## 2020-01-19 DIAGNOSIS — C678 Malignant neoplasm of overlapping sites of bladder: Secondary | ICD-10-CM | POA: Diagnosis not present

## 2020-01-19 DIAGNOSIS — E876 Hypokalemia: Secondary | ICD-10-CM | POA: Diagnosis not present

## 2020-01-19 DIAGNOSIS — K1230 Oral mucositis (ulcerative), unspecified: Secondary | ICD-10-CM | POA: Diagnosis not present

## 2020-01-19 DIAGNOSIS — C679 Malignant neoplasm of bladder, unspecified: Secondary | ICD-10-CM | POA: Diagnosis not present

## 2020-01-19 DIAGNOSIS — Z5111 Encounter for antineoplastic chemotherapy: Secondary | ICD-10-CM | POA: Diagnosis not present

## 2020-01-19 HISTORY — DX: Dehydration: E86.0

## 2020-01-26 ENCOUNTER — Other Ambulatory Visit: Payer: Self-pay | Admitting: *Deleted

## 2020-01-26 DIAGNOSIS — E43 Unspecified severe protein-calorie malnutrition: Secondary | ICD-10-CM | POA: Diagnosis not present

## 2020-01-26 DIAGNOSIS — D61810 Antineoplastic chemotherapy induced pancytopenia: Secondary | ICD-10-CM | POA: Diagnosis not present

## 2020-01-26 DIAGNOSIS — K1231 Oral mucositis (ulcerative) due to antineoplastic therapy: Secondary | ICD-10-CM | POA: Diagnosis not present

## 2020-01-26 DIAGNOSIS — B9620 Unspecified Escherichia coli [E. coli] as the cause of diseases classified elsewhere: Secondary | ICD-10-CM | POA: Diagnosis not present

## 2020-01-26 DIAGNOSIS — R638 Other symptoms and signs concerning food and fluid intake: Secondary | ICD-10-CM | POA: Diagnosis not present

## 2020-01-26 DIAGNOSIS — R918 Other nonspecific abnormal finding of lung field: Secondary | ICD-10-CM | POA: Diagnosis not present

## 2020-01-26 DIAGNOSIS — R1312 Dysphagia, oropharyngeal phase: Secondary | ICD-10-CM | POA: Diagnosis not present

## 2020-01-26 DIAGNOSIS — R7881 Bacteremia: Secondary | ICD-10-CM | POA: Diagnosis not present

## 2020-01-26 DIAGNOSIS — F329 Major depressive disorder, single episode, unspecified: Secondary | ICD-10-CM | POA: Diagnosis not present

## 2020-01-26 DIAGNOSIS — C679 Malignant neoplasm of bladder, unspecified: Secondary | ICD-10-CM | POA: Diagnosis not present

## 2020-01-26 DIAGNOSIS — R509 Fever, unspecified: Secondary | ICD-10-CM | POA: Diagnosis not present

## 2020-01-26 DIAGNOSIS — D61818 Other pancytopenia: Secondary | ICD-10-CM | POA: Diagnosis not present

## 2020-01-26 DIAGNOSIS — C678 Malignant neoplasm of overlapping sites of bladder: Secondary | ICD-10-CM | POA: Diagnosis not present

## 2020-01-26 DIAGNOSIS — D6959 Other secondary thrombocytopenia: Secondary | ICD-10-CM | POA: Diagnosis not present

## 2020-01-26 DIAGNOSIS — E876 Hypokalemia: Secondary | ICD-10-CM | POA: Diagnosis not present

## 2020-01-26 DIAGNOSIS — K1379 Other lesions of oral mucosa: Secondary | ICD-10-CM | POA: Diagnosis not present

## 2020-01-26 DIAGNOSIS — T451X5A Adverse effect of antineoplastic and immunosuppressive drugs, initial encounter: Secondary | ICD-10-CM | POA: Diagnosis not present

## 2020-01-26 DIAGNOSIS — D701 Agranulocytosis secondary to cancer chemotherapy: Secondary | ICD-10-CM | POA: Diagnosis not present

## 2020-01-26 DIAGNOSIS — N390 Urinary tract infection, site not specified: Secondary | ICD-10-CM | POA: Diagnosis not present

## 2020-01-26 DIAGNOSIS — E878 Other disorders of electrolyte and fluid balance, not elsewhere classified: Secondary | ICD-10-CM | POA: Diagnosis not present

## 2020-01-26 DIAGNOSIS — R42 Dizziness and giddiness: Secondary | ICD-10-CM | POA: Diagnosis not present

## 2020-01-26 DIAGNOSIS — D6481 Anemia due to antineoplastic chemotherapy: Secondary | ICD-10-CM | POA: Insufficient documentation

## 2020-01-26 DIAGNOSIS — R443 Hallucinations, unspecified: Secondary | ICD-10-CM | POA: Diagnosis not present

## 2020-01-27 ENCOUNTER — Other Ambulatory Visit: Payer: Medicare PPO

## 2020-01-27 ENCOUNTER — Inpatient Hospital Stay: Payer: Medicare PPO

## 2020-01-27 ENCOUNTER — Ambulatory Visit: Payer: Medicare PPO

## 2020-02-01 MED ORDER — ACETAMINOPHEN 325 MG PO TABS
650.00 | ORAL_TABLET | ORAL | Status: DC
Start: ? — End: 2020-02-01

## 2020-02-01 MED ORDER — FIRST-MOUTHWASH BLM MT SUSP
5.00 | OROMUCOSAL | Status: DC
Start: ? — End: 2020-02-01

## 2020-02-01 MED ORDER — SENNOSIDES 8.8 MG/5ML PO SYRP
10.00 | ORAL_SOLUTION | ORAL | Status: DC
Start: 2020-02-01 — End: 2020-02-01

## 2020-02-01 MED ORDER — LIDOCAINE HCL 1 % IJ SOLN
0.50 | INTRAMUSCULAR | Status: DC
Start: ? — End: 2020-02-01

## 2020-02-01 MED ORDER — POLYETHYLENE GLYCOL 3350 17 GM/SCOOP PO POWD
17.00 | ORAL | Status: DC
Start: 2020-02-02 — End: 2020-02-01

## 2020-02-01 MED ORDER — GENERIC EXTERNAL MEDICATION
40.00 | Status: DC
Start: 2020-02-01 — End: 2020-02-01

## 2020-02-01 MED ORDER — ONDANSETRON HCL 4 MG/2ML IJ SOLN
4.00 | INTRAMUSCULAR | Status: DC
Start: ? — End: 2020-02-01

## 2020-02-01 MED ORDER — FAMOTIDINE 20 MG/2ML IV SOLN
20.00 | INTRAVENOUS | Status: DC
Start: 2020-02-01 — End: 2020-02-01

## 2020-02-01 MED ORDER — OXYCODONE HCL 5 MG/5ML PO SOLN
10.00 | ORAL | Status: DC
Start: ? — End: 2020-02-01

## 2020-02-01 MED ORDER — HYDROMORPHONE HCL 1 MG/ML IJ SOLN
0.50 | INTRAMUSCULAR | Status: DC
Start: ? — End: 2020-02-01

## 2020-02-01 MED ORDER — LACTULOSE 10 GM/15ML PO SOLN
30.00 | ORAL | Status: DC
Start: ? — End: 2020-02-01

## 2020-02-01 MED ORDER — CIPROFLOXACIN 250 MG/5ML (5%) PO SUSR
750.00 | ORAL | Status: DC
Start: 2020-02-01 — End: 2020-02-01

## 2020-02-01 MED ORDER — LOSARTAN POTASSIUM 50 MG PO TABS
100.00 | ORAL_TABLET | ORAL | Status: DC
Start: 2020-02-02 — End: 2020-02-01

## 2020-02-01 MED ORDER — PAROXETINE HCL 20 MG PO TABS
20.00 | ORAL_TABLET | ORAL | Status: DC
Start: 2020-02-02 — End: 2020-02-01

## 2020-02-01 MED ORDER — LORAZEPAM 0.5 MG PO TABS
0.50 | ORAL_TABLET | ORAL | Status: DC
Start: ? — End: 2020-02-01

## 2020-02-01 MED ORDER — PROCHLORPERAZINE EDISYLATE 10 MG/2ML IJ SOLN
5.00 | INTRAMUSCULAR | Status: DC
Start: ? — End: 2020-02-01

## 2020-02-01 MED ORDER — AMLODIPINE BESYLATE 5 MG PO TABS
5.00 | ORAL_TABLET | ORAL | Status: DC
Start: 2020-02-02 — End: 2020-02-01

## 2020-02-08 DIAGNOSIS — E876 Hypokalemia: Secondary | ICD-10-CM | POA: Diagnosis not present

## 2020-02-08 DIAGNOSIS — R42 Dizziness and giddiness: Secondary | ICD-10-CM | POA: Diagnosis not present

## 2020-02-08 DIAGNOSIS — T451X5S Adverse effect of antineoplastic and immunosuppressive drugs, sequela: Secondary | ICD-10-CM | POA: Diagnosis not present

## 2020-02-08 DIAGNOSIS — K1231 Oral mucositis (ulcerative) due to antineoplastic therapy: Secondary | ICD-10-CM | POA: Diagnosis not present

## 2020-02-08 DIAGNOSIS — D61810 Antineoplastic chemotherapy induced pancytopenia: Secondary | ICD-10-CM | POA: Diagnosis not present

## 2020-02-08 DIAGNOSIS — C678 Malignant neoplasm of overlapping sites of bladder: Secondary | ICD-10-CM | POA: Diagnosis not present

## 2020-02-08 DIAGNOSIS — G893 Neoplasm related pain (acute) (chronic): Secondary | ICD-10-CM | POA: Diagnosis not present

## 2020-02-08 DIAGNOSIS — R911 Solitary pulmonary nodule: Secondary | ICD-10-CM | POA: Diagnosis not present

## 2020-02-10 ENCOUNTER — Inpatient Hospital Stay: Payer: Medicare PPO

## 2020-02-10 ENCOUNTER — Encounter: Payer: Self-pay | Admitting: *Deleted

## 2020-02-10 DIAGNOSIS — Z79899 Other long term (current) drug therapy: Secondary | ICD-10-CM | POA: Diagnosis not present

## 2020-02-10 DIAGNOSIS — Z87891 Personal history of nicotine dependence: Secondary | ICD-10-CM | POA: Diagnosis not present

## 2020-02-10 DIAGNOSIS — Z5181 Encounter for therapeutic drug level monitoring: Secondary | ICD-10-CM | POA: Diagnosis not present

## 2020-02-10 DIAGNOSIS — C678 Malignant neoplasm of overlapping sites of bladder: Secondary | ICD-10-CM | POA: Diagnosis not present

## 2020-02-10 DIAGNOSIS — E876 Hypokalemia: Secondary | ICD-10-CM | POA: Diagnosis not present

## 2020-02-10 NOTE — Progress Notes (Signed)
Patient has been a no-show to her last two appointments. These appointments where for supportive care between her treatments at [facility].   Spoke to patient who states she called the after hours line one evening and told them to cancel appointments. She's recently been hospitalized at [facility] and is seeing them for follow up. Cancelled the appointments for 6/16.  Patient states she is done with treatment and is scheduled for surgery 03/03/20. She does not need the appointment that is currently scheduled for 02/24/20. Cancelled those appointments.   Patient is doing well, but is weak after a hospitalization for mucositis. She states she is drinking well, but still has some struggles with food. She is under close care from the team at [facility]. She will contact us in the future for follow up and potential symptom management closer to home.

## 2020-02-11 DIAGNOSIS — C678 Malignant neoplasm of overlapping sites of bladder: Secondary | ICD-10-CM | POA: Diagnosis not present

## 2020-02-11 DIAGNOSIS — G893 Neoplasm related pain (acute) (chronic): Secondary | ICD-10-CM | POA: Diagnosis not present

## 2020-02-11 DIAGNOSIS — K1231 Oral mucositis (ulcerative) due to antineoplastic therapy: Secondary | ICD-10-CM | POA: Diagnosis not present

## 2020-02-11 DIAGNOSIS — R911 Solitary pulmonary nodule: Secondary | ICD-10-CM | POA: Diagnosis not present

## 2020-02-11 DIAGNOSIS — E876 Hypokalemia: Secondary | ICD-10-CM | POA: Diagnosis not present

## 2020-02-11 DIAGNOSIS — T451X5S Adverse effect of antineoplastic and immunosuppressive drugs, sequela: Secondary | ICD-10-CM | POA: Diagnosis not present

## 2020-02-11 DIAGNOSIS — R42 Dizziness and giddiness: Secondary | ICD-10-CM | POA: Diagnosis not present

## 2020-02-11 DIAGNOSIS — D61810 Antineoplastic chemotherapy induced pancytopenia: Secondary | ICD-10-CM | POA: Diagnosis not present

## 2020-02-15 DIAGNOSIS — G893 Neoplasm related pain (acute) (chronic): Secondary | ICD-10-CM | POA: Diagnosis not present

## 2020-02-15 DIAGNOSIS — R911 Solitary pulmonary nodule: Secondary | ICD-10-CM | POA: Diagnosis not present

## 2020-02-15 DIAGNOSIS — R42 Dizziness and giddiness: Secondary | ICD-10-CM | POA: Diagnosis not present

## 2020-02-15 DIAGNOSIS — C678 Malignant neoplasm of overlapping sites of bladder: Secondary | ICD-10-CM | POA: Diagnosis not present

## 2020-02-15 DIAGNOSIS — D61810 Antineoplastic chemotherapy induced pancytopenia: Secondary | ICD-10-CM | POA: Diagnosis not present

## 2020-02-15 DIAGNOSIS — T451X5S Adverse effect of antineoplastic and immunosuppressive drugs, sequela: Secondary | ICD-10-CM | POA: Diagnosis not present

## 2020-02-15 DIAGNOSIS — K1231 Oral mucositis (ulcerative) due to antineoplastic therapy: Secondary | ICD-10-CM | POA: Diagnosis not present

## 2020-02-15 DIAGNOSIS — E876 Hypokalemia: Secondary | ICD-10-CM | POA: Diagnosis not present

## 2020-02-18 DIAGNOSIS — T451X5S Adverse effect of antineoplastic and immunosuppressive drugs, sequela: Secondary | ICD-10-CM | POA: Diagnosis not present

## 2020-02-18 DIAGNOSIS — C678 Malignant neoplasm of overlapping sites of bladder: Secondary | ICD-10-CM | POA: Diagnosis not present

## 2020-02-18 DIAGNOSIS — E876 Hypokalemia: Secondary | ICD-10-CM | POA: Diagnosis not present

## 2020-02-18 DIAGNOSIS — D61810 Antineoplastic chemotherapy induced pancytopenia: Secondary | ICD-10-CM | POA: Diagnosis not present

## 2020-02-18 DIAGNOSIS — K1231 Oral mucositis (ulcerative) due to antineoplastic therapy: Secondary | ICD-10-CM | POA: Diagnosis not present

## 2020-02-18 DIAGNOSIS — R911 Solitary pulmonary nodule: Secondary | ICD-10-CM | POA: Diagnosis not present

## 2020-02-18 DIAGNOSIS — G893 Neoplasm related pain (acute) (chronic): Secondary | ICD-10-CM | POA: Diagnosis not present

## 2020-02-18 DIAGNOSIS — R42 Dizziness and giddiness: Secondary | ICD-10-CM | POA: Diagnosis not present

## 2020-02-19 DIAGNOSIS — F418 Other specified anxiety disorders: Secondary | ICD-10-CM | POA: Diagnosis not present

## 2020-02-19 DIAGNOSIS — K449 Diaphragmatic hernia without obstruction or gangrene: Secondary | ICD-10-CM | POA: Diagnosis not present

## 2020-02-19 DIAGNOSIS — J45909 Unspecified asthma, uncomplicated: Secondary | ICD-10-CM | POA: Diagnosis not present

## 2020-02-19 DIAGNOSIS — C678 Malignant neoplasm of overlapping sites of bladder: Secondary | ICD-10-CM | POA: Diagnosis not present

## 2020-02-19 DIAGNOSIS — Z79899 Other long term (current) drug therapy: Secondary | ICD-10-CM | POA: Diagnosis not present

## 2020-02-19 DIAGNOSIS — I10 Essential (primary) hypertension: Secondary | ICD-10-CM | POA: Diagnosis not present

## 2020-02-19 DIAGNOSIS — M1990 Unspecified osteoarthritis, unspecified site: Secondary | ICD-10-CM | POA: Diagnosis not present

## 2020-02-19 DIAGNOSIS — R1310 Dysphagia, unspecified: Secondary | ICD-10-CM | POA: Diagnosis not present

## 2020-02-19 DIAGNOSIS — D61818 Other pancytopenia: Secondary | ICD-10-CM | POA: Diagnosis not present

## 2020-02-19 DIAGNOSIS — Z01818 Encounter for other preprocedural examination: Secondary | ICD-10-CM | POA: Diagnosis not present

## 2020-02-19 DIAGNOSIS — E876 Hypokalemia: Secondary | ICD-10-CM | POA: Diagnosis not present

## 2020-02-19 DIAGNOSIS — K5790 Diverticulosis of intestine, part unspecified, without perforation or abscess without bleeding: Secondary | ICD-10-CM | POA: Diagnosis not present

## 2020-02-19 DIAGNOSIS — L409 Psoriasis, unspecified: Secondary | ICD-10-CM | POA: Diagnosis not present

## 2020-02-19 DIAGNOSIS — K219 Gastro-esophageal reflux disease without esophagitis: Secondary | ICD-10-CM | POA: Diagnosis not present

## 2020-02-19 DIAGNOSIS — R600 Localized edema: Secondary | ICD-10-CM | POA: Diagnosis not present

## 2020-02-22 DIAGNOSIS — D61810 Antineoplastic chemotherapy induced pancytopenia: Secondary | ICD-10-CM | POA: Diagnosis not present

## 2020-02-22 DIAGNOSIS — K1231 Oral mucositis (ulcerative) due to antineoplastic therapy: Secondary | ICD-10-CM | POA: Diagnosis not present

## 2020-02-22 DIAGNOSIS — T451X5S Adverse effect of antineoplastic and immunosuppressive drugs, sequela: Secondary | ICD-10-CM | POA: Diagnosis not present

## 2020-02-22 DIAGNOSIS — G893 Neoplasm related pain (acute) (chronic): Secondary | ICD-10-CM | POA: Diagnosis not present

## 2020-02-22 DIAGNOSIS — C678 Malignant neoplasm of overlapping sites of bladder: Secondary | ICD-10-CM | POA: Diagnosis not present

## 2020-02-22 DIAGNOSIS — R911 Solitary pulmonary nodule: Secondary | ICD-10-CM | POA: Diagnosis not present

## 2020-02-22 DIAGNOSIS — E876 Hypokalemia: Secondary | ICD-10-CM | POA: Diagnosis not present

## 2020-02-22 DIAGNOSIS — R42 Dizziness and giddiness: Secondary | ICD-10-CM | POA: Diagnosis not present

## 2020-02-24 ENCOUNTER — Other Ambulatory Visit: Payer: Medicare PPO

## 2020-02-24 ENCOUNTER — Ambulatory Visit: Payer: Medicare PPO

## 2020-02-26 DIAGNOSIS — R911 Solitary pulmonary nodule: Secondary | ICD-10-CM | POA: Diagnosis not present

## 2020-02-26 DIAGNOSIS — E876 Hypokalemia: Secondary | ICD-10-CM | POA: Diagnosis not present

## 2020-02-26 DIAGNOSIS — T451X5S Adverse effect of antineoplastic and immunosuppressive drugs, sequela: Secondary | ICD-10-CM | POA: Diagnosis not present

## 2020-02-26 DIAGNOSIS — K1231 Oral mucositis (ulcerative) due to antineoplastic therapy: Secondary | ICD-10-CM | POA: Diagnosis not present

## 2020-02-26 DIAGNOSIS — R42 Dizziness and giddiness: Secondary | ICD-10-CM | POA: Diagnosis not present

## 2020-02-26 DIAGNOSIS — C678 Malignant neoplasm of overlapping sites of bladder: Secondary | ICD-10-CM | POA: Diagnosis not present

## 2020-02-26 DIAGNOSIS — G893 Neoplasm related pain (acute) (chronic): Secondary | ICD-10-CM | POA: Diagnosis not present

## 2020-02-26 DIAGNOSIS — D61810 Antineoplastic chemotherapy induced pancytopenia: Secondary | ICD-10-CM | POA: Diagnosis not present

## 2020-03-01 DIAGNOSIS — C678 Malignant neoplasm of overlapping sites of bladder: Secondary | ICD-10-CM | POA: Diagnosis not present

## 2020-03-01 DIAGNOSIS — Z20822 Contact with and (suspected) exposure to covid-19: Secondary | ICD-10-CM | POA: Diagnosis not present

## 2020-03-03 DIAGNOSIS — Z7189 Other specified counseling: Secondary | ICD-10-CM | POA: Diagnosis not present

## 2020-03-03 DIAGNOSIS — T451X5A Adverse effect of antineoplastic and immunosuppressive drugs, initial encounter: Secondary | ICD-10-CM | POA: Diagnosis not present

## 2020-03-03 DIAGNOSIS — C775 Secondary and unspecified malignant neoplasm of intrapelvic lymph nodes: Secondary | ICD-10-CM | POA: Diagnosis not present

## 2020-03-03 DIAGNOSIS — G4701 Insomnia due to medical condition: Secondary | ICD-10-CM | POA: Diagnosis not present

## 2020-03-03 DIAGNOSIS — E785 Hyperlipidemia, unspecified: Secondary | ICD-10-CM | POA: Diagnosis not present

## 2020-03-03 DIAGNOSIS — C679 Malignant neoplasm of bladder, unspecified: Secondary | ICD-10-CM | POA: Diagnosis not present

## 2020-03-03 DIAGNOSIS — D62 Acute posthemorrhagic anemia: Secondary | ICD-10-CM | POA: Diagnosis not present

## 2020-03-03 DIAGNOSIS — D6481 Anemia due to antineoplastic chemotherapy: Secondary | ICD-10-CM | POA: Diagnosis not present

## 2020-03-03 DIAGNOSIS — R443 Hallucinations, unspecified: Secondary | ICD-10-CM | POA: Diagnosis not present

## 2020-03-03 DIAGNOSIS — I10 Essential (primary) hypertension: Secondary | ICD-10-CM | POA: Diagnosis not present

## 2020-03-03 DIAGNOSIS — R911 Solitary pulmonary nodule: Secondary | ICD-10-CM | POA: Diagnosis not present

## 2020-03-03 DIAGNOSIS — R140 Abdominal distension (gaseous): Secondary | ICD-10-CM | POA: Diagnosis not present

## 2020-03-03 DIAGNOSIS — G8918 Other acute postprocedural pain: Secondary | ICD-10-CM | POA: Diagnosis not present

## 2020-03-03 DIAGNOSIS — F4323 Adjustment disorder with mixed anxiety and depressed mood: Secondary | ICD-10-CM | POA: Diagnosis not present

## 2020-03-03 DIAGNOSIS — G893 Neoplasm related pain (acute) (chronic): Secondary | ICD-10-CM | POA: Diagnosis not present

## 2020-03-03 DIAGNOSIS — D6959 Other secondary thrombocytopenia: Secondary | ICD-10-CM | POA: Diagnosis not present

## 2020-03-03 DIAGNOSIS — D259 Leiomyoma of uterus, unspecified: Secondary | ICD-10-CM | POA: Diagnosis not present

## 2020-03-03 DIAGNOSIS — K1231 Oral mucositis (ulcerative) due to antineoplastic therapy: Secondary | ICD-10-CM | POA: Diagnosis not present

## 2020-03-03 DIAGNOSIS — T451X5S Adverse effect of antineoplastic and immunosuppressive drugs, sequela: Secondary | ICD-10-CM | POA: Diagnosis not present

## 2020-03-03 DIAGNOSIS — C678 Malignant neoplasm of overlapping sites of bladder: Secondary | ICD-10-CM | POA: Diagnosis not present

## 2020-03-03 DIAGNOSIS — R42 Dizziness and giddiness: Secondary | ICD-10-CM | POA: Diagnosis not present

## 2020-03-03 DIAGNOSIS — D61818 Other pancytopenia: Secondary | ICD-10-CM | POA: Diagnosis not present

## 2020-03-03 DIAGNOSIS — Z9189 Other specified personal risk factors, not elsewhere classified: Secondary | ICD-10-CM | POA: Diagnosis not present

## 2020-03-03 DIAGNOSIS — E876 Hypokalemia: Secondary | ICD-10-CM | POA: Diagnosis not present

## 2020-03-03 DIAGNOSIS — D61810 Antineoplastic chemotherapy induced pancytopenia: Secondary | ICD-10-CM | POA: Diagnosis not present

## 2020-03-09 DIAGNOSIS — R42 Dizziness and giddiness: Secondary | ICD-10-CM | POA: Diagnosis not present

## 2020-03-09 DIAGNOSIS — D61810 Antineoplastic chemotherapy induced pancytopenia: Secondary | ICD-10-CM | POA: Diagnosis not present

## 2020-03-09 DIAGNOSIS — C678 Malignant neoplasm of overlapping sites of bladder: Secondary | ICD-10-CM | POA: Diagnosis not present

## 2020-03-09 DIAGNOSIS — G893 Neoplasm related pain (acute) (chronic): Secondary | ICD-10-CM | POA: Diagnosis not present

## 2020-03-09 DIAGNOSIS — E876 Hypokalemia: Secondary | ICD-10-CM | POA: Diagnosis not present

## 2020-03-09 DIAGNOSIS — T451X5S Adverse effect of antineoplastic and immunosuppressive drugs, sequela: Secondary | ICD-10-CM | POA: Diagnosis not present

## 2020-03-09 DIAGNOSIS — K1231 Oral mucositis (ulcerative) due to antineoplastic therapy: Secondary | ICD-10-CM | POA: Diagnosis not present

## 2020-03-09 DIAGNOSIS — R911 Solitary pulmonary nodule: Secondary | ICD-10-CM | POA: Diagnosis not present

## 2020-03-10 DIAGNOSIS — R42 Dizziness and giddiness: Secondary | ICD-10-CM | POA: Diagnosis not present

## 2020-03-10 DIAGNOSIS — T451X5S Adverse effect of antineoplastic and immunosuppressive drugs, sequela: Secondary | ICD-10-CM | POA: Diagnosis not present

## 2020-03-10 DIAGNOSIS — R911 Solitary pulmonary nodule: Secondary | ICD-10-CM | POA: Diagnosis not present

## 2020-03-10 DIAGNOSIS — G893 Neoplasm related pain (acute) (chronic): Secondary | ICD-10-CM | POA: Diagnosis not present

## 2020-03-10 DIAGNOSIS — C678 Malignant neoplasm of overlapping sites of bladder: Secondary | ICD-10-CM | POA: Diagnosis not present

## 2020-03-10 DIAGNOSIS — E876 Hypokalemia: Secondary | ICD-10-CM | POA: Diagnosis not present

## 2020-03-10 DIAGNOSIS — D61810 Antineoplastic chemotherapy induced pancytopenia: Secondary | ICD-10-CM | POA: Diagnosis not present

## 2020-03-10 DIAGNOSIS — K1231 Oral mucositis (ulcerative) due to antineoplastic therapy: Secondary | ICD-10-CM | POA: Diagnosis not present

## 2020-03-14 DIAGNOSIS — R42 Dizziness and giddiness: Secondary | ICD-10-CM | POA: Diagnosis not present

## 2020-03-14 DIAGNOSIS — G893 Neoplasm related pain (acute) (chronic): Secondary | ICD-10-CM | POA: Diagnosis not present

## 2020-03-14 DIAGNOSIS — R911 Solitary pulmonary nodule: Secondary | ICD-10-CM | POA: Diagnosis not present

## 2020-03-14 DIAGNOSIS — C678 Malignant neoplasm of overlapping sites of bladder: Secondary | ICD-10-CM | POA: Diagnosis not present

## 2020-03-14 DIAGNOSIS — D61810 Antineoplastic chemotherapy induced pancytopenia: Secondary | ICD-10-CM | POA: Diagnosis not present

## 2020-03-14 DIAGNOSIS — T451X5S Adverse effect of antineoplastic and immunosuppressive drugs, sequela: Secondary | ICD-10-CM | POA: Diagnosis not present

## 2020-03-14 DIAGNOSIS — K1231 Oral mucositis (ulcerative) due to antineoplastic therapy: Secondary | ICD-10-CM | POA: Diagnosis not present

## 2020-03-14 DIAGNOSIS — E876 Hypokalemia: Secondary | ICD-10-CM | POA: Diagnosis not present

## 2020-03-15 DIAGNOSIS — R911 Solitary pulmonary nodule: Secondary | ICD-10-CM | POA: Diagnosis not present

## 2020-03-15 DIAGNOSIS — T451X5S Adverse effect of antineoplastic and immunosuppressive drugs, sequela: Secondary | ICD-10-CM | POA: Diagnosis not present

## 2020-03-15 DIAGNOSIS — C678 Malignant neoplasm of overlapping sites of bladder: Secondary | ICD-10-CM | POA: Diagnosis not present

## 2020-03-15 DIAGNOSIS — G893 Neoplasm related pain (acute) (chronic): Secondary | ICD-10-CM | POA: Diagnosis not present

## 2020-03-15 DIAGNOSIS — R42 Dizziness and giddiness: Secondary | ICD-10-CM | POA: Diagnosis not present

## 2020-03-15 DIAGNOSIS — K1231 Oral mucositis (ulcerative) due to antineoplastic therapy: Secondary | ICD-10-CM | POA: Diagnosis not present

## 2020-03-15 DIAGNOSIS — E876 Hypokalemia: Secondary | ICD-10-CM | POA: Diagnosis not present

## 2020-03-15 DIAGNOSIS — D61810 Antineoplastic chemotherapy induced pancytopenia: Secondary | ICD-10-CM | POA: Diagnosis not present

## 2020-03-16 ENCOUNTER — Ambulatory Visit: Payer: Medicare PPO | Admitting: Family Medicine

## 2020-03-16 DIAGNOSIS — C678 Malignant neoplasm of overlapping sites of bladder: Secondary | ICD-10-CM | POA: Diagnosis not present

## 2020-03-16 DIAGNOSIS — R918 Other nonspecific abnormal finding of lung field: Secondary | ICD-10-CM | POA: Diagnosis not present

## 2020-03-16 DIAGNOSIS — Z87891 Personal history of nicotine dependence: Secondary | ICD-10-CM | POA: Diagnosis not present

## 2020-03-16 DIAGNOSIS — R519 Headache, unspecified: Secondary | ICD-10-CM | POA: Diagnosis not present

## 2020-03-16 DIAGNOSIS — T451X5A Adverse effect of antineoplastic and immunosuppressive drugs, initial encounter: Secondary | ICD-10-CM | POA: Diagnosis not present

## 2020-03-16 DIAGNOSIS — D6481 Anemia due to antineoplastic chemotherapy: Secondary | ICD-10-CM | POA: Diagnosis not present

## 2020-03-16 DIAGNOSIS — D3501 Benign neoplasm of right adrenal gland: Secondary | ICD-10-CM | POA: Diagnosis not present

## 2020-03-16 DIAGNOSIS — C779 Secondary and unspecified malignant neoplasm of lymph node, unspecified: Secondary | ICD-10-CM | POA: Diagnosis not present

## 2020-03-16 DIAGNOSIS — Z90722 Acquired absence of ovaries, bilateral: Secondary | ICD-10-CM | POA: Diagnosis not present

## 2020-03-17 DIAGNOSIS — T451X5S Adverse effect of antineoplastic and immunosuppressive drugs, sequela: Secondary | ICD-10-CM | POA: Diagnosis not present

## 2020-03-17 DIAGNOSIS — E876 Hypokalemia: Secondary | ICD-10-CM | POA: Diagnosis not present

## 2020-03-17 DIAGNOSIS — D61810 Antineoplastic chemotherapy induced pancytopenia: Secondary | ICD-10-CM | POA: Diagnosis not present

## 2020-03-17 DIAGNOSIS — R911 Solitary pulmonary nodule: Secondary | ICD-10-CM | POA: Diagnosis not present

## 2020-03-17 DIAGNOSIS — G893 Neoplasm related pain (acute) (chronic): Secondary | ICD-10-CM | POA: Diagnosis not present

## 2020-03-17 DIAGNOSIS — R42 Dizziness and giddiness: Secondary | ICD-10-CM | POA: Diagnosis not present

## 2020-03-17 DIAGNOSIS — C678 Malignant neoplasm of overlapping sites of bladder: Secondary | ICD-10-CM | POA: Diagnosis not present

## 2020-03-17 DIAGNOSIS — K1231 Oral mucositis (ulcerative) due to antineoplastic therapy: Secondary | ICD-10-CM | POA: Diagnosis not present

## 2020-03-18 ENCOUNTER — Telehealth: Payer: Self-pay | Admitting: Family Medicine

## 2020-03-18 DIAGNOSIS — R42 Dizziness and giddiness: Secondary | ICD-10-CM | POA: Diagnosis not present

## 2020-03-18 DIAGNOSIS — C678 Malignant neoplasm of overlapping sites of bladder: Secondary | ICD-10-CM | POA: Diagnosis not present

## 2020-03-18 DIAGNOSIS — E876 Hypokalemia: Secondary | ICD-10-CM | POA: Diagnosis not present

## 2020-03-18 DIAGNOSIS — T451X5S Adverse effect of antineoplastic and immunosuppressive drugs, sequela: Secondary | ICD-10-CM | POA: Diagnosis not present

## 2020-03-18 DIAGNOSIS — K1231 Oral mucositis (ulcerative) due to antineoplastic therapy: Secondary | ICD-10-CM | POA: Diagnosis not present

## 2020-03-18 DIAGNOSIS — R911 Solitary pulmonary nodule: Secondary | ICD-10-CM | POA: Diagnosis not present

## 2020-03-18 DIAGNOSIS — G893 Neoplasm related pain (acute) (chronic): Secondary | ICD-10-CM | POA: Diagnosis not present

## 2020-03-18 DIAGNOSIS — D61810 Antineoplastic chemotherapy induced pancytopenia: Secondary | ICD-10-CM | POA: Diagnosis not present

## 2020-03-18 NOTE — Telephone Encounter (Signed)
Spoke to office told them that she needs to be sent to oncology we have not followed up with patient.

## 2020-03-18 NOTE — Telephone Encounter (Signed)
I have received home health orders to sign yesterday and today on Christina [name]. I am unable to sign these orders until she is seen. If [facility] needs these signed ASAP then they should reach out to her oncologist which is treating her for this condition.   Thanks.

## 2020-03-21 ENCOUNTER — Other Ambulatory Visit: Payer: Self-pay

## 2020-03-21 ENCOUNTER — Encounter: Payer: Self-pay | Admitting: Family Medicine

## 2020-03-21 ENCOUNTER — Ambulatory Visit: Payer: Medicare PPO | Admitting: Family Medicine

## 2020-03-21 VITALS — BP 82/55 | HR 87 | Temp 98.0°F | Resp 18 | Ht 65.0 in | Wt 150.0 lb

## 2020-03-21 DIAGNOSIS — I959 Hypotension, unspecified: Secondary | ICD-10-CM

## 2020-03-21 DIAGNOSIS — E876 Hypokalemia: Secondary | ICD-10-CM | POA: Diagnosis not present

## 2020-03-21 DIAGNOSIS — C678 Malignant neoplasm of overlapping sites of bladder: Secondary | ICD-10-CM

## 2020-03-21 DIAGNOSIS — I10 Essential (primary) hypertension: Secondary | ICD-10-CM

## 2020-03-21 DIAGNOSIS — T451X5S Adverse effect of antineoplastic and immunosuppressive drugs, sequela: Secondary | ICD-10-CM | POA: Diagnosis not present

## 2020-03-21 DIAGNOSIS — F418 Other specified anxiety disorders: Secondary | ICD-10-CM

## 2020-03-21 DIAGNOSIS — R42 Dizziness and giddiness: Secondary | ICD-10-CM | POA: Diagnosis not present

## 2020-03-21 DIAGNOSIS — R911 Solitary pulmonary nodule: Secondary | ICD-10-CM | POA: Diagnosis not present

## 2020-03-21 DIAGNOSIS — K1231 Oral mucositis (ulcerative) due to antineoplastic therapy: Secondary | ICD-10-CM | POA: Diagnosis not present

## 2020-03-21 DIAGNOSIS — D61810 Antineoplastic chemotherapy induced pancytopenia: Secondary | ICD-10-CM | POA: Diagnosis not present

## 2020-03-21 DIAGNOSIS — G893 Neoplasm related pain (acute) (chronic): Secondary | ICD-10-CM | POA: Diagnosis not present

## 2020-03-21 LAB — CBC WITH DIFFERENTIAL/PLATELET
Basophils Absolute: 0 10*3/uL (ref 0.0–0.1)
Basophils Relative: 0.9 % (ref 0.0–3.0)
Eosinophils Absolute: 0.2 10*3/uL (ref 0.0–0.7)
Eosinophils Relative: 3.4 % (ref 0.0–5.0)
HCT: 32.5 % — ABNORMAL LOW (ref 36.0–46.0)
Hemoglobin: 10.8 g/dL — ABNORMAL LOW (ref 12.0–15.0)
Lymphocytes Relative: 23.5 % (ref 12.0–46.0)
Lymphs Abs: 1.2 10*3/uL (ref 0.7–4.0)
MCHC: 33.4 g/dL (ref 30.0–36.0)
MCV: 101.1 fl — ABNORMAL HIGH (ref 78.0–100.0)
Monocytes Absolute: 0.5 10*3/uL (ref 0.1–1.0)
Monocytes Relative: 10.4 % (ref 3.0–12.0)
Neutro Abs: 3.1 10*3/uL (ref 1.4–7.7)
Neutrophils Relative %: 61.8 % (ref 43.0–77.0)
Platelets: 281 10*3/uL (ref 150.0–400.0)
RBC: 3.21 Mil/uL — ABNORMAL LOW (ref 3.87–5.11)
RDW: 17.9 % — ABNORMAL HIGH (ref 11.5–15.5)
WBC: 5 10*3/uL (ref 4.0–10.5)

## 2020-03-21 MED ORDER — SERTRALINE HCL 25 MG PO TABS
25.0000 mg | ORAL_TABLET | Freq: Every day | ORAL | 1 refills | Status: DC
Start: 2020-03-21 — End: 2020-05-30

## 2020-03-21 NOTE — Progress Notes (Signed)
Christina [name] , [birth year], 79 y.o., female MRN: [id number] Patient Care Team    Relationship Specialty Notifications Start End  Ma Hillock, DO PCP - General Family Medicine  09/02/17   Rolm Bookbinder, MD Consulting Physician General Surgery  10/05/16   Monna Fam, MD Consulting Physician Ophthalmology  10/05/16   Garry Heater, DDS Consulting Physician Dentistry  10/05/16   Juanita Craver, MD Consulting Physician Gastroenterology  09/02/17   Volanda Napoleon, MD Medical Oncologist Oncology  05/14/19     Chief Complaint  Patient presents with  . Bladder Cancer  . Hospitalization Follow-up    Pt has had a lot of vertigo.      Subjective:  Christina [name]  is a 79 y.o. female presents today with fatigue, dizziness and hypotension. She was admitted to the hospital 03/03/2020 and discharged 03/09/2020 under the [facility] urology service with the admission diagnosis of malignant neoplasm of overlapping sites of bladder. Patient underwent a radical cystectomy with ileal conduit and hysterectomy with bilateral salpingectomy oophorectomy. Patient reports overall she feels like she was adjusting well to the ostomy and surgery. She has been rather fatigued and feeling very dizzy. She denies any nausea, vomit or abdominal pain. She reports she had some mild constipation that has resolved and she has had a bowel movement this morning that was normal. She is tolerating p.o. She did take meclizine today secondary to her being dizzy. Her blood pressure regimen has also changed initially it was losartan 75 mg daily. She is now reportedly taking losartan 100 mg daily and amlodipine 5 mg daily. Reviewed recent lab work collected resulted with stable CBC and normal BMP.  Situational anxiety:  Was started on Paxil by her oncology team at the onset of diagnosis of bladder cancer. She does feel that it has been helpful along with the Ativan 0.5 mg twice daily. She reports she had a provider tell her that  Zoloft is better for her than Paxil. She is wondering if she should switch medications today.    Recent Labs  Lab 03/21/20 1219  HGB 10.8*  HCT 32.5*  WBC 5.0  PLT 281.0   CMP Latest Ref Rng & Units 01/13/2020 11/04/2019 10/22/2019  Glucose 70 - 99 mg/dL 151(H) 113(H) 106(H)  BUN 8 - 23 mg/dL 25(H) 18 14  Creatinine 0.44 - 1.00 mg/dL 0.98 0.83 0.60  Sodium 135 - 145 mmol/L 138 140 142  Potassium 3.5 - 5.1 mmol/L 3.0(LL) 4.3 4.0  Chloride 98 - 111 mmol/L 98 105 105  CO2 22 - 32 mmol/L 31 29 -  Calcium 8.9 - 10.3 mg/dL 9.2 10.5(H) -  Total Protein 6.5 - 8.1 g/dL 5.8(L) 6.9 -  Total Bilirubin 0.3 - 1.2 mg/dL 1.2 0.8 -  Alkaline Phos 38 - 126 U/L 73 72 -  AST 15 - 41 U/L 15 12(L) -  ALT 0 - 44 U/L 19 9 -    No results found.   Depression screen [facility] 2/9 01/14/2020 09/16/2019 06/05/2018 03/06/2018 12/17/2017  Decreased Interest 0 0 0 0 0  Down, Depressed, Hopeless 0 1 0 0 0  PHQ - 2 Score 0 1 0 0 0  Altered sleeping - - - - -  Tired, decreased energy - - - - -  Change in appetite - - - - -  Feeling bad or failure about yourself  - - - - -  Trouble concentrating - - - - -  Moving slowly or fidgety/restless - - - - -  Suicidal thoughts - - - - -  PHQ-9 Score - - - - -  Some recent data might be hidden    Allergies  Allergen Reactions  . Nutrasweet Aspartame [Aspartame] Diarrhea  . Lisinopril Cough   Social History   Tobacco Use  . Smoking status: Former Smoker    Packs/day: 1.00    Years: 30.00    Pack years: 30.00    Types: Cigarettes    Quit date: 08/27/1990    Years since quitting: 29.5  . Smokeless tobacco: Never Used  Substance Use Topics  . Alcohol use: No   Past Medical History:  Diagnosis Date  . Arthritis   . Asthma    a little?, no problems in several years  . Bladder cancer ([place])   . Bladder tumor   . Cervical cancer screening 01/31/2012  . Chicken pox as a child  . Degenerative tear of acetabular labrum of left hip 02/26/2019  . Depression with  anxiety 06/07/2009   Qualifier: Diagnosis of  By: [name] MD, [name]    . Dermatitis of external ear 07/11/2012  . Diverticulosis   . Ganglion cyst 02/26/2019   Right elbow  . Hiatal hernia with gastroesophageal reflux 10/26/2010   Qualifier: Diagnosis of  By: [name] MD, [name]    . Hyperglycemia 06/21/2013  . Hyperlipidemia, mixed 10/16/2015   pt unaware  . Hypertension   . Left hip pain 07/10/2015  . Lesion of breast 12/03/2016   benign; resolved  . Low back pain 05/29/2015  . Measles as a child  . Mumps as a child  . Osteopenia 01/03/2017  . Overweight (BMI 25.0-29.9) 10/07/2008   Qualifier: Diagnosis of  By: [name] MD, [name]    . Palpitations    several years ago, not currently  . Pre-diabetes   . Psoriasis    ears  . RUQ pain 05/29/2015  . Spider veins    Bilateral legs  . Spinal stenosis   . Tailor's bunionette, left 09/02/2017  . Umbilical hernia   . Urinary frequency 09/28/2011  . Vertigo   . Wears dentures    Upper,   Past Surgical History:  Procedure Laterality Date  . ABDOMINAL SURGERY  1970's  . BREAST BIOPSY Right 1980   BREAST EXCISIONAL BIOPSY  . BREAST BIOPSY Left 1970   BREAST EXCISIONAL BIOPSY  . BREAST CYST ASPIRATION    . CATARACT EXTRACTION, BILATERAL    . COLONOSCOPY    . CYSTECTOMY     abdomen  . CYSTOSCOPY W/ RETROGRADES Bilateral 04/27/2019   Procedure: CYSTOSCOPY WITH RETROGRADE PYELOGRAM;  Surgeon: Cleon Gustin, MD;  Location: [facility];  Service: Urology;  Laterality: Bilateral;  . EYE SURGERY Bilateral    cataract  . INSERTION OF MESH N/A 06/11/2017   Procedure: INSERTION OF MESH;  Surgeon: Rolm Bookbinder, MD;  Location: [place];  Service: General;  Laterality: N/A;  BILATERAL TAP BLOCK  . TRANSURETHRAL RESECTION OF BLADDER TUMOR N/A 04/27/2019   Procedure: TRANSURETHRAL RESECTION OF BLADDER TUMOR (TURBT);  Surgeon: Cleon Gustin, MD;  Location: [facility];  Service: Urology;   Laterality: N/A;  1 HR  . TRANSURETHRAL RESECTION OF BLADDER TUMOR N/A 06/01/2019   Procedure: TRANSURETHRAL RESECTION OF BLADDER TUMOR (TURBT);  Surgeon: Cleon Gustin, MD;  Location: [facility];  Service: Urology;  Laterality: N/A;  1 HR  . TRANSURETHRAL RESECTION OF BLADDER TUMOR N/A 10/22/2019   Procedure: TRANSURETHRAL RESECTION OF BLADDER TUMOR (TURBT);  Surgeon: Alyson Ingles,  Candee Furbish, MD;  Location[facility];  Service: Urology;  Laterality: N/A;  . TUBAL LIGATION  1970  . VENTRAL HERNIA REPAIR N/A 06/11/2017   Procedure: LAPAROSCOPIC VENTRAL HERNIA REPAIR WITH MESH ERAS PATHWAY;  Surgeon: Rolm Bookbinder, MD;  Location: [place];  Service: General;  Laterality: N/A;  BILATERAL TAP BLOCK   Family History  Problem Relation Age of Onset  . Hypertension Mother   . Anxiety disorder Mother   . Ovarian cancer Mother 73       lung- smoker, ovarian  . Lung cancer Mother        smoker  . Arthritis Mother   . Hyperlipidemia Mother   . Alcohol abuse Father   . Diabetes Sister   . Hypertension Sister   . Ovarian cancer Sister   . Arthritis Sister   . Depression Sister   . Hyperlipidemia Sister   . Other Maternal Grandmother        pacemaker  . Multiple sclerosis Maternal Grandfather        ?  [name] Arthritis Brother   . COPD Brother   . Heart attack Brother   . Hyperlipidemia Brother   . Stroke Brother   . Asthma Sister   . Depression Sister   . Hyperlipidemia Sister   . Breast cancer Sister   . Asthma Sister   . Depression Sister   . Asthma Paternal Uncle    Allergies as of 03/21/2020      Reactions   Nutrasweet Aspartame [aspartame] Diarrhea   Lisinopril Cough      Medication List       Accurate as of March 21, 2020 11:59 PM. If you have any questions, ask your nurse or doctor.        STOP taking these medications   dexamethasone 4 MG tablet Commonly known as: DECADRON Stopped by: Howard Pouch, DO   ondansetron 8 MG tablet  Commonly known as: ZOFRAN Stopped by: Howard Pouch, DO   oxyCODONE 5 MG immediate release tablet Commonly known as: Oxy IR/ROXICODONE Stopped by: Howard Pouch, DO   PARoxetine 20 MG tablet Commonly known as: PAXIL Stopped by: Howard Pouch, DO     TAKE these medications   First-Mouthwash BLM Susp Swish and spit 5 mLs every 6 (six) hours as needed   LORazepam 0.5 MG tablet Commonly known as: ATIVAN Take 1 tablet (0.5 mg total) by mouth 2 (two) times daily as needed for anxiety.   losartan 50 MG tablet Commonly known as: COZAAR Take 1.5 tablets (75 mg total) by mouth daily.   meclizine 25 MG tablet Commonly known as: ANTIVERT Take 1 tablet (25 mg total) by mouth 3 (three) times daily as needed for dizziness.   potassium chloride SA 20 MEQ tablet Commonly known as: KLOR-CON Take 2 tablets (40 mEq total) by mouth daily.   sertraline 25 MG tablet Commonly known as: Zoloft Take 1 tablet (25 mg total) by mouth daily. Started by: Howard Pouch, DO   triamcinolone cream 0.1 % Commonly known as: KENALOG Apply topically 2 (two) times daily.       All past medical history, surgical history, allergies, family history, immunizations and medications were updated in the EMR today and reviewed under the history and medication portions of their EMR.      ROS: Negative, with the exception of above mentioned in HPI   Objective:  BP (!) 82/55 (BP Location: Right Arm, Patient Position: Sitting, Cuff Size: Normal)   Pulse 87  Temp 98 F (36.7 C) (Temporal)   Resp 18   Ht 5\' 5"  (1.651 m)   Wt 150 lb (68 kg)   SpO2 97%   BMI 24.96 kg/m  Body mass index is 24.96 kg/m. Gen: Afebrile. No acute distress. Appears very fatigued.  HENT: AT. [place].  Eyes:Pupils Equal Round Reactive to light, Extraocular movements intact,  Conjunctiva without redness, discharge or icterus. CV: RRR no murmur, no edema Chest: CTAB, no wheeze or crackles. Good air movement, normal resp effort.  Abd: Soft.  Ostomy bag in place. Ostomy without erythema or drainage, and healing very nicely. NTND. BS present.  MSK: No CVA tenderness Skin: No rashes, purpura or petechiae.  Neuro:  Normal gait. PERLA. EOMi. Alert. Oriented x3  Psych: Normal affect, dress and demeanor. Normal speech. Normal thought content and judgment.  Assessment/Plan: Christina [name] is a 79 y.o. female present for OV for Hospital discharge follow up Malignant neoplasm of overlapping sites of bladder [facility]) Continue following with urology/oncology. - Urine Culture  Situational anxiety Patient had testing to bladder cancer diagnosis and radical cystectomy. Okay to switch Paxil to Zoloft 25 mg daily.  Continue Ativan 0.5 mg twice daily as needed as needed.  Essential hypertension, benign> hypotensive today Prior to surgery patient was prescribed losartan 75 mg daily. During chemo she was having some lower blood pressures and not feeling well and she was told to hold medication if blood pressures less than 110. Since that time her blood pressure regimen has been increased by another team and she is now taking amlodipine 5 mg daily and losartan 100 mg daily. Patient is extremely hypotensive today and symptomatic. Rule out infection by urine culture. CBC with differential. Low blood pressure may be also related to her use of meclizine this morning. Patient was encouraged to not take blood pressure medications, until after checking her blood pressure in the morning If < 140/90 do not take medication. Recheck at lunch and if elevated you can take 1 tab of losartan then. Update: Patient was called the next day and she has been feeling much improved. "Best she has felt in a long time "since holding the blood pressure regimen and had not  needed any of her blood pressure meds. She was then provided with instructions on how to take losartan depending upon her blood pressure regimen. Close follow-up is arranged.   Reviewed expectations re:  course of current medical issues.  Discussed self-management of symptoms.  Outlined signs and symptoms indicating need for more acute intervention.  Patient verbalized understanding and all questions were answered.  Patient received an After-Visit Summary.  Any changes in medications were reviewed and patient was provided with updated med list with their AVS.      Orders Placed This Encounter  Procedures  . Urine Culture  . CBC w/Diff   > 45 Minutes was dedicated to this patient's encounter to include pre-visit review of chart, face-to-face time with patient and post-visit work- which include documentation and prescribing medications and/or ordering test when necessary.     Note is dictated utilizing voice recognition software. Although note has been proof read prior to signing, occasional typographical errors still can be missed. If any questions arise, please do not hesitate to call for verification.   electronically signed by:  Howard Pouch, DO  [place]

## 2020-03-21 NOTE — Patient Instructions (Signed)
Check BP in the morning before taking BP medications. If < 140/90 do not take medication. Recheck at lunch and if elevated you can take meds then.   I believe your lower BP today is from the meclizine. However we need to make sure.   We are testing for anemia, infection and testing your urine today.  If BP remains < 100/60 >>> please call in.   If dizziness worsens and BP remains low>> go to ED   Stop paxil and start zoloft in its place.

## 2020-03-22 ENCOUNTER — Telehealth: Payer: Self-pay

## 2020-03-22 LAB — URINE CULTURE
MICRO NUMBER:: 10749178
SPECIMEN QUALITY:: ADEQUATE

## 2020-03-22 NOTE — Telephone Encounter (Signed)
Pt was called back and she said her BP was 120/78 and then she took with another BP cuff she had and it was 129/84. Pt advised to use one BP cuff, the newest and the one with newer batteries. Pt advised to not keep checking BP every 30 mins and to just check in the morning and at lunch unless she is having symptoms. She agreed and would call back if needed. She was advised those BP are good and she did not need to take BP med this AM.

## 2020-03-22 NOTE — Telephone Encounter (Signed)
Patient requests call back. She said she spoke with Christina [name] at her appointment and she has another question.

## 2020-03-23 ENCOUNTER — Telehealth: Payer: Self-pay | Admitting: Family Medicine

## 2020-03-23 NOTE — Telephone Encounter (Signed)
Pt was called and she said she has felt the best she has felt in a long time. Pt has not taken Meclizine or BP meds.today. Pt asked for my chart message to be sent with instructions so she could print and read them. This was sent to her and she was advised to call back with any questions or concerns.

## 2020-03-23 NOTE — Telephone Encounter (Signed)
Good to hear

## 2020-03-23 NOTE — Telephone Encounter (Signed)
Her urine culture did not grow a specific bacteria. No abx needed at this time as long as she is feeling better and not dizzy.  Glad to see her BP has returned to normal range.  Please ask her if she is feeling better, less dizzy etc? Continue to take BP morning and lunchtime, if < 140/90 do not take BP meds.     - if 141-150/>90 take 1/2 of losartan tab (25 mg).     - if 150-160/>90 take 1 losartan tab (50 mg)    - if > 160/>90 take 1.5 losartan tabs (75 mg) Please ask her to write down her BP daily and if she needed to take BP med and at what dose.   F/U 1 mo for recheck w/ BP logs- virtual appt please.   Lastly, did she get the 2nd moderna covid vaccine? It would have been due mid-May?  Thanks

## 2020-03-24 DIAGNOSIS — L409 Psoriasis, unspecified: Secondary | ICD-10-CM | POA: Insufficient documentation

## 2020-03-24 DIAGNOSIS — G893 Neoplasm related pain (acute) (chronic): Secondary | ICD-10-CM | POA: Diagnosis not present

## 2020-03-24 DIAGNOSIS — C678 Malignant neoplasm of overlapping sites of bladder: Secondary | ICD-10-CM | POA: Diagnosis not present

## 2020-03-24 DIAGNOSIS — R42 Dizziness and giddiness: Secondary | ICD-10-CM | POA: Diagnosis not present

## 2020-03-24 DIAGNOSIS — T451X5S Adverse effect of antineoplastic and immunosuppressive drugs, sequela: Secondary | ICD-10-CM | POA: Diagnosis not present

## 2020-03-24 DIAGNOSIS — M4800 Spinal stenosis, site unspecified: Secondary | ICD-10-CM | POA: Insufficient documentation

## 2020-03-24 DIAGNOSIS — K1231 Oral mucositis (ulcerative) due to antineoplastic therapy: Secondary | ICD-10-CM | POA: Diagnosis not present

## 2020-03-24 DIAGNOSIS — R911 Solitary pulmonary nodule: Secondary | ICD-10-CM | POA: Diagnosis not present

## 2020-03-24 DIAGNOSIS — E876 Hypokalemia: Secondary | ICD-10-CM | POA: Diagnosis not present

## 2020-03-24 DIAGNOSIS — D61810 Antineoplastic chemotherapy induced pancytopenia: Secondary | ICD-10-CM | POA: Diagnosis not present

## 2020-03-25 DIAGNOSIS — E876 Hypokalemia: Secondary | ICD-10-CM | POA: Diagnosis not present

## 2020-03-25 DIAGNOSIS — T451X5S Adverse effect of antineoplastic and immunosuppressive drugs, sequela: Secondary | ICD-10-CM | POA: Diagnosis not present

## 2020-03-25 DIAGNOSIS — R911 Solitary pulmonary nodule: Secondary | ICD-10-CM | POA: Diagnosis not present

## 2020-03-25 DIAGNOSIS — K1231 Oral mucositis (ulcerative) due to antineoplastic therapy: Secondary | ICD-10-CM | POA: Diagnosis not present

## 2020-03-25 DIAGNOSIS — R42 Dizziness and giddiness: Secondary | ICD-10-CM | POA: Diagnosis not present

## 2020-03-25 DIAGNOSIS — G893 Neoplasm related pain (acute) (chronic): Secondary | ICD-10-CM | POA: Diagnosis not present

## 2020-03-25 DIAGNOSIS — C678 Malignant neoplasm of overlapping sites of bladder: Secondary | ICD-10-CM | POA: Diagnosis not present

## 2020-03-25 DIAGNOSIS — D61810 Antineoplastic chemotherapy induced pancytopenia: Secondary | ICD-10-CM | POA: Diagnosis not present

## 2020-03-30 DIAGNOSIS — K1231 Oral mucositis (ulcerative) due to antineoplastic therapy: Secondary | ICD-10-CM | POA: Diagnosis not present

## 2020-03-30 DIAGNOSIS — G893 Neoplasm related pain (acute) (chronic): Secondary | ICD-10-CM | POA: Diagnosis not present

## 2020-03-30 DIAGNOSIS — R911 Solitary pulmonary nodule: Secondary | ICD-10-CM | POA: Diagnosis not present

## 2020-03-30 DIAGNOSIS — D61810 Antineoplastic chemotherapy induced pancytopenia: Secondary | ICD-10-CM | POA: Diagnosis not present

## 2020-03-30 DIAGNOSIS — T451X5S Adverse effect of antineoplastic and immunosuppressive drugs, sequela: Secondary | ICD-10-CM | POA: Diagnosis not present

## 2020-03-30 DIAGNOSIS — C678 Malignant neoplasm of overlapping sites of bladder: Secondary | ICD-10-CM | POA: Diagnosis not present

## 2020-03-30 DIAGNOSIS — E876 Hypokalemia: Secondary | ICD-10-CM | POA: Diagnosis not present

## 2020-03-30 DIAGNOSIS — R42 Dizziness and giddiness: Secondary | ICD-10-CM | POA: Diagnosis not present

## 2020-03-31 ENCOUNTER — Telehealth: Payer: Self-pay | Admitting: Family Medicine

## 2020-03-31 NOTE — Telephone Encounter (Signed)
Received home health orders x2 for patient 03/31/2020.  Both forms were completed and returned to nursing station work area.

## 2020-04-01 DIAGNOSIS — E876 Hypokalemia: Secondary | ICD-10-CM | POA: Diagnosis not present

## 2020-04-01 DIAGNOSIS — R911 Solitary pulmonary nodule: Secondary | ICD-10-CM | POA: Diagnosis not present

## 2020-04-01 DIAGNOSIS — G893 Neoplasm related pain (acute) (chronic): Secondary | ICD-10-CM | POA: Diagnosis not present

## 2020-04-01 DIAGNOSIS — T451X5S Adverse effect of antineoplastic and immunosuppressive drugs, sequela: Secondary | ICD-10-CM | POA: Diagnosis not present

## 2020-04-01 DIAGNOSIS — R42 Dizziness and giddiness: Secondary | ICD-10-CM | POA: Diagnosis not present

## 2020-04-01 DIAGNOSIS — K1231 Oral mucositis (ulcerative) due to antineoplastic therapy: Secondary | ICD-10-CM | POA: Diagnosis not present

## 2020-04-01 DIAGNOSIS — C678 Malignant neoplasm of overlapping sites of bladder: Secondary | ICD-10-CM | POA: Diagnosis not present

## 2020-04-01 DIAGNOSIS — D61810 Antineoplastic chemotherapy induced pancytopenia: Secondary | ICD-10-CM | POA: Diagnosis not present

## 2020-04-05 DIAGNOSIS — C678 Malignant neoplasm of overlapping sites of bladder: Secondary | ICD-10-CM | POA: Diagnosis not present

## 2020-04-05 DIAGNOSIS — T451X5S Adverse effect of antineoplastic and immunosuppressive drugs, sequela: Secondary | ICD-10-CM | POA: Diagnosis not present

## 2020-04-05 DIAGNOSIS — K1231 Oral mucositis (ulcerative) due to antineoplastic therapy: Secondary | ICD-10-CM | POA: Diagnosis not present

## 2020-04-05 DIAGNOSIS — R42 Dizziness and giddiness: Secondary | ICD-10-CM | POA: Diagnosis not present

## 2020-04-05 DIAGNOSIS — G893 Neoplasm related pain (acute) (chronic): Secondary | ICD-10-CM | POA: Diagnosis not present

## 2020-04-05 DIAGNOSIS — R911 Solitary pulmonary nodule: Secondary | ICD-10-CM | POA: Diagnosis not present

## 2020-04-05 DIAGNOSIS — D61810 Antineoplastic chemotherapy induced pancytopenia: Secondary | ICD-10-CM | POA: Diagnosis not present

## 2020-04-05 DIAGNOSIS — E876 Hypokalemia: Secondary | ICD-10-CM | POA: Diagnosis not present

## 2020-04-06 DIAGNOSIS — C678 Malignant neoplasm of overlapping sites of bladder: Secondary | ICD-10-CM | POA: Diagnosis not present

## 2020-04-06 DIAGNOSIS — R42 Dizziness and giddiness: Secondary | ICD-10-CM | POA: Diagnosis not present

## 2020-04-06 DIAGNOSIS — T451X5S Adverse effect of antineoplastic and immunosuppressive drugs, sequela: Secondary | ICD-10-CM | POA: Diagnosis not present

## 2020-04-06 DIAGNOSIS — R911 Solitary pulmonary nodule: Secondary | ICD-10-CM | POA: Diagnosis not present

## 2020-04-06 DIAGNOSIS — D61810 Antineoplastic chemotherapy induced pancytopenia: Secondary | ICD-10-CM | POA: Diagnosis not present

## 2020-04-06 DIAGNOSIS — G893 Neoplasm related pain (acute) (chronic): Secondary | ICD-10-CM | POA: Diagnosis not present

## 2020-04-06 DIAGNOSIS — K1231 Oral mucositis (ulcerative) due to antineoplastic therapy: Secondary | ICD-10-CM | POA: Diagnosis not present

## 2020-04-06 DIAGNOSIS — E876 Hypokalemia: Secondary | ICD-10-CM | POA: Diagnosis not present

## 2020-04-13 DIAGNOSIS — Z90710 Acquired absence of both cervix and uterus: Secondary | ICD-10-CM | POA: Diagnosis not present

## 2020-04-13 DIAGNOSIS — Z8551 Personal history of malignant neoplasm of bladder: Secondary | ICD-10-CM | POA: Diagnosis not present

## 2020-04-13 DIAGNOSIS — N63 Unspecified lump in unspecified breast: Secondary | ICD-10-CM | POA: Diagnosis not present

## 2020-04-13 DIAGNOSIS — R911 Solitary pulmonary nodule: Secondary | ICD-10-CM | POA: Diagnosis not present

## 2020-04-13 DIAGNOSIS — C678 Malignant neoplasm of overlapping sites of bladder: Secondary | ICD-10-CM | POA: Diagnosis not present

## 2020-04-13 DIAGNOSIS — R188 Other ascites: Secondary | ICD-10-CM | POA: Diagnosis not present

## 2020-04-15 ENCOUNTER — Encounter: Payer: Self-pay | Admitting: *Deleted

## 2020-04-15 NOTE — Progress Notes (Signed)
Patient has completed her treatment and surgery at [facility]. She would like to return to Dr Marin Olp for follow up and observation. She has some order from her [place] that will need to be completed in November. She has a port that will need to be flushed at the end of September.   Recommended that she come in a see Dr Marin Olp at the end of September when she needs her port flushed and she can discuss future plan with him at that time. She is to bring the orders at that visit to review with Dr Marin Olp. She is in agreement with this plan.   Message sent to schedulers.

## 2020-04-21 ENCOUNTER — Other Ambulatory Visit: Payer: Self-pay | Admitting: Family

## 2020-04-28 ENCOUNTER — Telehealth: Payer: Self-pay

## 2020-04-28 NOTE — Telephone Encounter (Signed)
Received an MD phone message on this patient.  If it was the accident please close out phone note or provide information. Thanks

## 2020-05-03 IMAGING — DX LEFT FEMUR 2 VIEWS
4 series · 4 of 4 positions shown · non-contrast
Comparison: None.

CLINICAL DATA: Leg pain for several months, no known injury,
initial encounter

EXAM:
LEFT FEMUR 2 VIEWS

[femur ap]
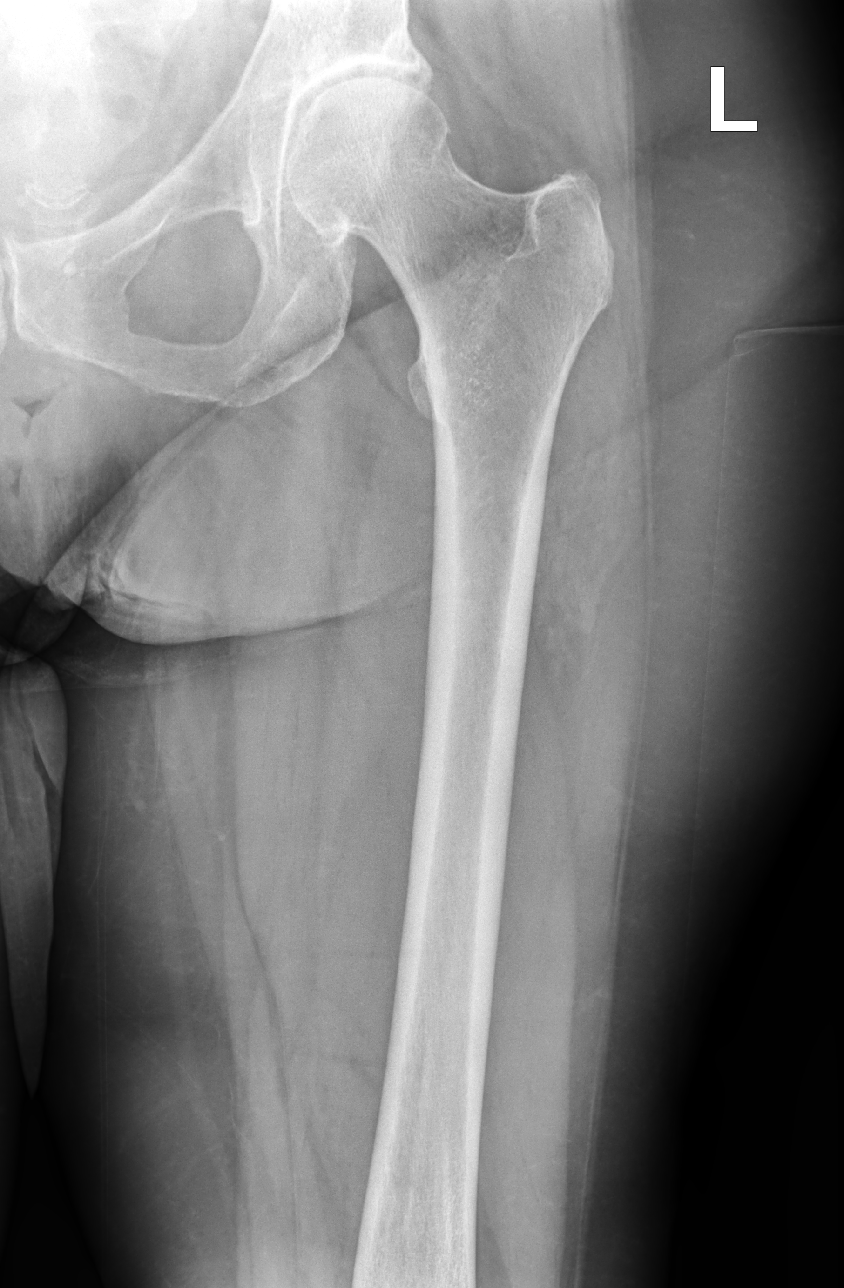

[femur lat]
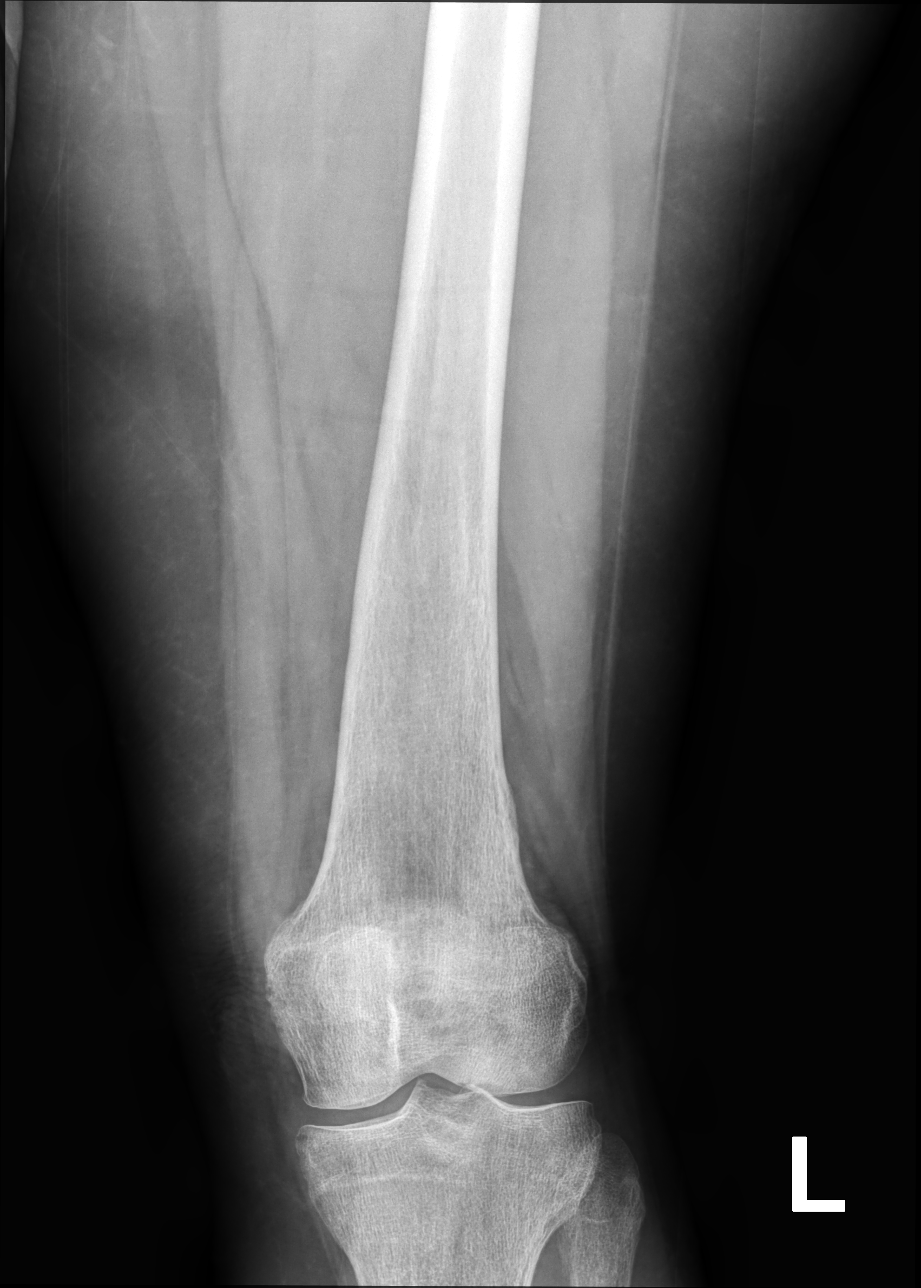

[hip joint ap]
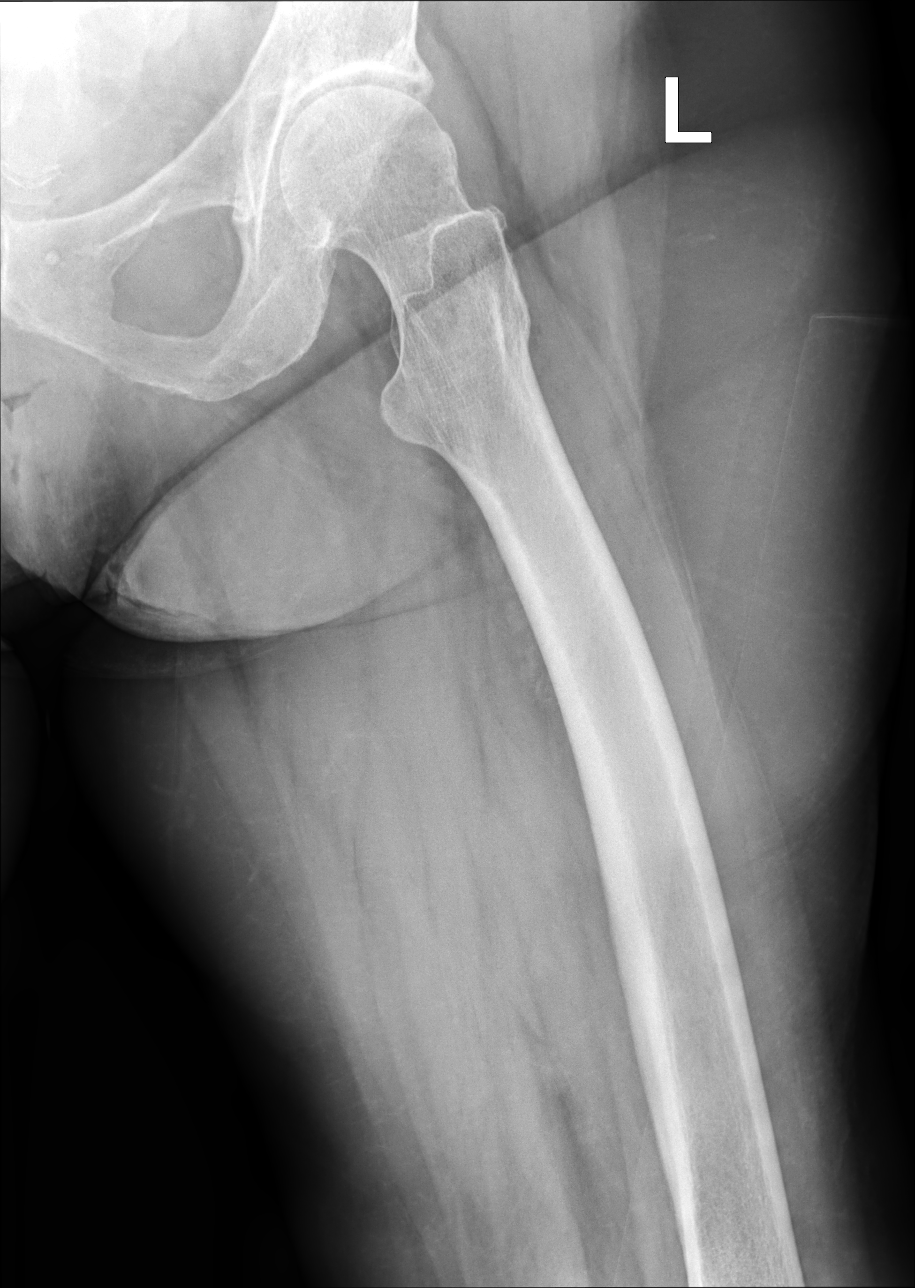

[hip cross table lat]
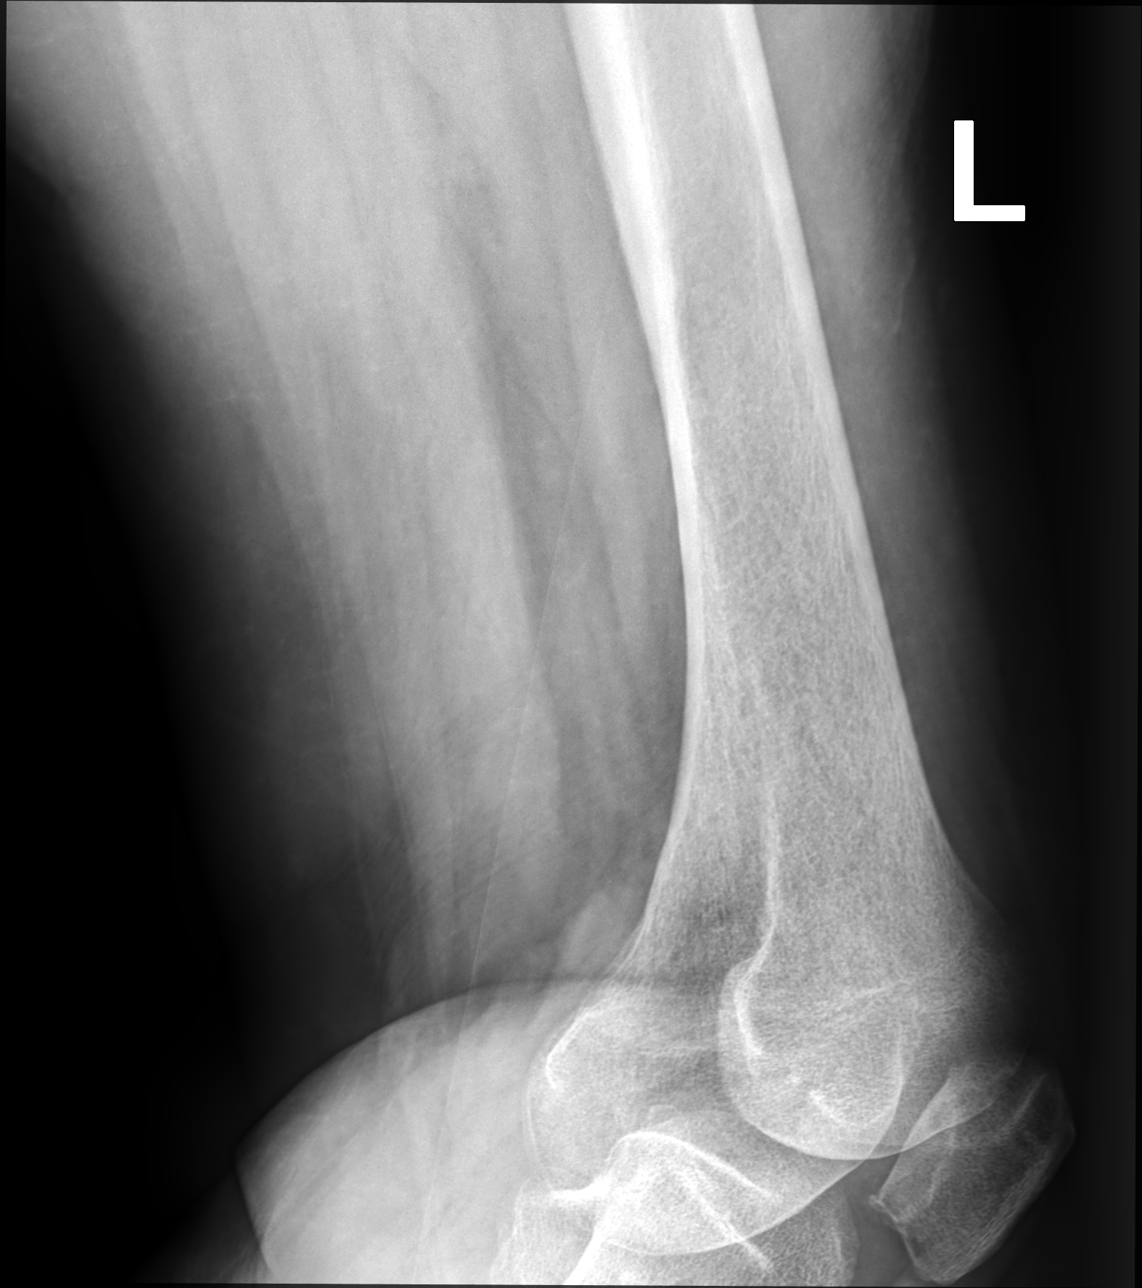

[4 of 4 positions shown; findings below may reference images not displayed]

FINDINGS: No acute fracture or dislocation is noted. Degenerative changes of
left hip joint are seen. No acute bony abnormality is noted.
IMPRESSION: Degenerative change without acute abnormality

## 2020-05-05 DIAGNOSIS — Z936 Other artificial openings of urinary tract status: Secondary | ICD-10-CM | POA: Diagnosis not present

## 2020-05-05 DIAGNOSIS — K9409 Other complications of colostomy: Secondary | ICD-10-CM | POA: Diagnosis not present

## 2020-05-19 ENCOUNTER — Other Ambulatory Visit: Payer: Self-pay | Admitting: Hematology and Oncology

## 2020-05-19 DIAGNOSIS — C678 Malignant neoplasm of overlapping sites of bladder: Secondary | ICD-10-CM

## 2020-05-26 ENCOUNTER — Inpatient Hospital Stay: Payer: Medicare PPO

## 2020-05-26 ENCOUNTER — Encounter: Payer: Self-pay | Admitting: *Deleted

## 2020-05-26 ENCOUNTER — Other Ambulatory Visit: Payer: Self-pay

## 2020-05-26 ENCOUNTER — Telehealth: Payer: Self-pay | Admitting: Hematology & Oncology

## 2020-05-26 ENCOUNTER — Inpatient Hospital Stay: Payer: Medicare PPO | Attending: Hematology & Oncology | Admitting: Hematology & Oncology

## 2020-05-26 ENCOUNTER — Encounter: Payer: Self-pay | Admitting: Hematology & Oncology

## 2020-05-26 VITALS — BP 100/61 | HR 61 | Temp 97.9°F | Resp 18 | Ht 65.0 in | Wt 143.0 lb

## 2020-05-26 DIAGNOSIS — Z7901 Long term (current) use of anticoagulants: Secondary | ICD-10-CM | POA: Insufficient documentation

## 2020-05-26 DIAGNOSIS — Z452 Encounter for adjustment and management of vascular access device: Secondary | ICD-10-CM | POA: Diagnosis not present

## 2020-05-26 DIAGNOSIS — C679 Malignant neoplasm of bladder, unspecified: Secondary | ICD-10-CM | POA: Diagnosis not present

## 2020-05-26 DIAGNOSIS — C670 Malignant neoplasm of trigone of bladder: Secondary | ICD-10-CM | POA: Diagnosis not present

## 2020-05-26 DIAGNOSIS — Z9221 Personal history of antineoplastic chemotherapy: Secondary | ICD-10-CM | POA: Diagnosis not present

## 2020-05-26 DIAGNOSIS — Z79899 Other long term (current) drug therapy: Secondary | ICD-10-CM | POA: Diagnosis not present

## 2020-05-26 DIAGNOSIS — E876 Hypokalemia: Secondary | ICD-10-CM

## 2020-05-26 DIAGNOSIS — Z95828 Presence of other vascular implants and grafts: Secondary | ICD-10-CM

## 2020-05-26 LAB — CMP (CANCER CENTER ONLY)
ALT: 9 U/L (ref 0–44)
AST: 16 U/L (ref 15–41)
Albumin: 3.9 g/dL (ref 3.5–5.0)
Alkaline Phosphatase: 61 U/L (ref 38–126)
Anion gap: 6 (ref 5–15)
BUN: 18 mg/dL (ref 8–23)
CO2: 28 mmol/L (ref 22–32)
Calcium: 10.5 mg/dL — ABNORMAL HIGH (ref 8.9–10.3)
Chloride: 107 mmol/L (ref 98–111)
Creatinine: 0.94 mg/dL (ref 0.44–1.00)
GFR, Est AFR Am: >60 mL/min (ref >60)
GFR, Estimated: 58 mL/min — ABNORMAL LOW (ref >60)
Glucose, Bld: 93 mg/dL (ref 70–99)
Potassium: 4.2 mmol/L (ref 3.5–5.1)
Sodium: 141 mmol/L (ref 135–145)
Total Bilirubin: 0.6 mg/dL (ref 0.3–1.2)
Total Protein: 6.4 g/dL — ABNORMAL LOW (ref 6.5–8.1)

## 2020-05-26 LAB — CBC WITH DIFFERENTIAL (CANCER CENTER ONLY)
Abs Immature Granulocytes: 0.01 10*3/uL (ref 0.00–0.07)
Basophils Absolute: 0 10*3/uL (ref 0.0–0.1)
Basophils Relative: 1 %
Eosinophils Absolute: 0.1 10*3/uL (ref 0.0–0.5)
Eosinophils Relative: 2 %
HCT: 36.5 % (ref 36.0–46.0)
Hemoglobin: 12 g/dL (ref 12.0–15.0)
Immature Granulocytes: 0 %
Lymphocytes Relative: 33 %
Lymphs Abs: 1.8 10*3/uL (ref 0.7–4.0)
MCH: 33.9 pg (ref 26.0–34.0)
MCHC: 32.9 g/dL (ref 30.0–36.0)
MCV: 103.1 fL — ABNORMAL HIGH (ref 80.0–100.0)
Monocytes Absolute: 0.5 10*3/uL (ref 0.1–1.0)
Monocytes Relative: 9 %
Neutro Abs: 3 10*3/uL (ref 1.7–7.7)
Neutrophils Relative %: 55 %
Platelet Count: 178 10*3/uL (ref 150–400)
RBC: 3.54 MIL/uL — ABNORMAL LOW (ref 3.87–5.11)
RDW: 12.8 % (ref 11.5–15.5)
WBC Count: 5.3 10*3/uL (ref 4.0–10.5)
nRBC: 0 % (ref 0.0–0.2)

## 2020-05-26 MED ORDER — SODIUM CHLORIDE 0.9% FLUSH
10.0000 mL | Freq: Once | INTRAVENOUS | Status: AC
  Administered 2020-05-26: 10 mL via INTRAVENOUS
  Filled 2020-05-26: qty 10

## 2020-05-26 MED ORDER — HEPARIN SOD (PORK) LOCK FLUSH 100 UNIT/ML IV SOLN
500.0000 [IU] | Freq: Once | INTRAVENOUS | Status: AC
  Administered 2020-05-26: 500 [IU] via INTRAVENOUS
  Filled 2020-05-26: qty 5

## 2020-05-26 NOTE — Telephone Encounter (Signed)
Patient will get contrast from Imaging Department for scans to be done in October 2 days before MD visit per 9/30 los

## 2020-05-26 NOTE — Telephone Encounter (Signed)
Appointments scheduled calendar printed per 9/30 los

## 2020-05-26 NOTE — Progress Notes (Signed)
Hematology and Oncology Follow Up Visit  Christina [name] [id number] [birth year] 79 y.o. 05/26/2020   Principle Diagnosis:   Stage IIIB (T1N3M0) invasive urothelial carcinoma of the bladder   Current Therapy:    Status post neoadjuvant chemotherapy with ddMVAC  Radical cystectomy on 03/03/2020      Interim History:  Christina [name] is back for follow-up.  Unfortunately, she has had quite a story since we last saw her.  She did have muscle invasive bladder cancer.  She was referred out to [facility].  She had neoadjuvant chemotherapy with dose dense MVAC.  I think she had 3 or 4 cycles of this.  She was hospitalized with the third cycle because of toxicity and cytopenias.  She then underwent surgical resection with radical cystectomy.  This was on 03/03/2020.  The pathology report ([id number]) showed no residual carcinoma in the bladder.  However, she had multiple lymph nodes that were positive.  I think she had 12/21 lymph nodes that were positive.  She had involvement of common iliac lymph nodes.  As such, she was a stage IIIB ([id number]) bladder cancer.  This is incredibly worrisome.  After having neoadjuvant chemotherapy which was incredibly aggressive she subsequently still had 12 lymph nodes that were positive.  I think the chance of her having recurrent disease is going to be quite high.  She does look quite good.  Obviously she has had great care out at [facility].  She has a urostomy.  This seems to be working pretty well.  Her appetite is doing okay.  She has had no nausea or vomiting.  She has had no problems with her bowels.  She has had no bleeding.  She has had no cough or shortness of breath.  There is been no rashes.  She has had no leg swelling.  There has been no headache.  Overall, her performance status is ECOG 1.   Medications:  Current Outpatient Medications:  .  DPH-Lido-AlHydr-MgHydr-Simeth (FIRST-MOUTHWASH BLM) SUSP, Swish and spit 5 mLs every 6 (six) hours as needed, Disp:  , Rfl:  .  enoxaparin (LOVENOX) 40 MG/0.4ML injection, , Disp: , Rfl:  .  famotidine (PEPCID) 20 MG tablet, Take by mouth., Disp: , Rfl:  .  fentaNYL (DURAGESIC) 12 MCG/HR, , Disp: , Rfl:  .  furosemide (LASIX) 20 MG tablet, , Disp: , Rfl:  .  lactulose (CHRONULAC) 10 GM/15ML solution, Take by mouth., Disp: , Rfl:  .  LORazepam (ATIVAN) 0.5 MG tablet, Take 1 tablet (0.5 mg total) by mouth 2 (two) times daily as needed for anxiety., Disp: 60 tablet, Rfl: 5 .  losartan (COZAAR) 50 MG tablet, Take 1.5 tablets (75 mg total) by mouth daily., Disp: 135 tablet, Rfl: 1 .  magnesium oxide (MAG-OX) 400 MG tablet, Take by mouth., Disp: , Rfl:  .  meclizine (ANTIVERT) 25 MG tablet, Take 1 tablet (25 mg total) by mouth 3 (three) times daily as needed for dizziness., Disp: 30 tablet, Rfl: 0 .  potassium chloride SA (KLOR-CON) 20 MEQ tablet, Take 2 tablets (40 mEq total) by mouth daily., Disp: 14 tablet, Rfl: 0 .  sertraline (ZOLOFT) 25 MG tablet, Take 1 tablet (25 mg total) by mouth daily., Disp: 90 tablet, Rfl: 1 .  triamcinolone cream (KENALOG) 0.1 %, Apply topically 2 (two) times daily., Disp: 80 g, Rfl: 0  Allergies:  Allergies  Allergen Reactions  . Nutrasweet Aspartame [Aspartame] Diarrhea  . Lisinopril Cough    Past Medical History, Surgical history, Social history, and  Family History were reviewed and updated.  Review of Systems: Review of Systems  Constitutional: Negative.   HENT:  Negative.   Eyes: Negative.   Respiratory: Negative.   Cardiovascular: Negative.   Gastrointestinal: Negative.   Endocrine: Negative.   Genitourinary: Negative.    Musculoskeletal: Negative.   Skin: Negative.   Neurological: Negative.   Hematological: Negative.   Psychiatric/Behavioral: Negative.     Physical Exam:  height is 5\' 5"  (1.651 m) and weight is 143 lb (64.9 kg). Her oral temperature is 97.9 F (36.6 C). Her blood pressure is 100/61 and her pulse is 61. Her respiration is 18 and oxygen  saturation is 100%.   Wt Readings from Last 3 Encounters:  05/26/20 143 lb (64.9 kg)  03/21/20 150 lb (68 kg)  11/10/19 170 lb (77.1 kg)    Physical Exam Vitals reviewed.  HENT:     Head: Normocephalic and atraumatic.  Eyes:     Pupils: Pupils are equal, round, and reactive to light.  Cardiovascular:     Rate and Rhythm: Normal rate and regular rhythm.     Heart sounds: Normal heart sounds.  Pulmonary:     Effort: Pulmonary effort is normal.     Breath sounds: Normal breath sounds.  Abdominal:     General: Bowel sounds are normal.     Palpations: Abdomen is soft.     Comments: Abdominal exam shows the healed laparotomy scar in the midline.  She has some firmness in the middle of the laparotomy scar.  She has the urostomy bag which is well-positioned.  Urine is clear.  There is no fluid wave in the abdomen.  Bowel sounds are present.  She has no palpable liver or spleen tip.  Musculoskeletal:        General: No tenderness or deformity. Normal range of motion.     Cervical back: Normal range of motion.  Lymphadenopathy:     Cervical: No cervical adenopathy.  Skin:    General: Skin is warm and dry.     Findings: No erythema or rash.  Neurological:     Mental Status: She is alert and oriented to person, place, and time.  Psychiatric:        Behavior: Behavior normal.        Thought Content: Thought content normal.        Judgment: Judgment normal.     Lab Results  Component Value Date   WBC 5.3 05/26/2020   HGB 12.0 05/26/2020   HCT 36.5 05/26/2020   MCV 103.1 (H) 05/26/2020   PLT 178 05/26/2020     Chemistry      Component Value Date/Time   NA 138 01/13/2020 1007   NA 142 03/04/2017 1016   K 3.0 (LL) 01/13/2020 1007   CL 98 01/13/2020 1007   CO2 31 01/13/2020 1007   BUN 25 (H) 01/13/2020 1007   BUN 13 03/04/2017 1016   CREATININE 0.98 01/13/2020 1007   CREATININE 0.78 05/20/2015 1704      Component Value Date/Time   CALCIUM 9.2 01/13/2020 1007   ALKPHOS 73  01/13/2020 1007   AST 15 01/13/2020 1007   ALT 19 01/13/2020 1007   BILITOT 1.2 01/13/2020 1007      Impression and Plan: Christina [name] is a 79 year old white female.  She had superficial bladder cancer.  She was treated with intravesicular therapy.  However, she has had had muscle invasive cancer which was poorly differentiated.  She then underwent neoadjuvant chemotherapy followed by  radical cystectomy.  She still is found to have stage IIIB disease.  She definitely has a high risk of recurrence.  She needs to be followed with CAT scans every 3 months.  We will see about getting her set up with a CAT scan in October.  Maybe she can have some CAT scans done locally since for only 10 minutes from her house.  She will see her oncologist at [facility] in January.  She will have a CT scan done there at the time.  I just feel bad for her.  I know she is doing all that she can do.  I would like to think that at [facility], she had molecular markers done on her cancer.  This might help determine what type of therapy she might benefit from if she does have recurrent disease.    I spent about 45 minutes or so with her today.  This was very distressing to her.   We had a good prayer.  Her faith still remains strong.   Volanda Napoleon, MD 9/30/202110:34 AM

## 2020-05-26 NOTE — Patient Instructions (Signed)

## 2020-05-26 NOTE — Progress Notes (Signed)
Oncology Nurse Navigator Documentation  Oncology Nurse Navigator Flowsheets 05/26/2020  Abnormal Finding Date -  Confirmed Diagnosis Date -  Diagnosis Status -  Navigator Follow Up Date: 06/24/2020  Navigator Follow Up Reason: Follow-up Appointment  Navigation Complete Date: -  Post Navigation: Continue to Follow Patient? -  Reason Not Navigating Patient: -  Navigator Location [facility]  Referral Date to RadOnc/MedOnc -  Navigator Encounter Type Treatment  Telephone -  Patient Visit Type MedOnc  Treatment Phase Post-Tx Follow-up  Barriers/Navigation Needs No Barriers At This Time  Interventions Psycho-Social Support  Acuity Level 1-No Barriers  Coordination of Care -  Support Groups/Services Friends and Family  Time Spent with Patient 15

## 2020-05-30 ENCOUNTER — Ambulatory Visit (INDEPENDENT_AMBULATORY_CARE_PROVIDER_SITE_OTHER): Payer: Medicare PPO

## 2020-05-30 ENCOUNTER — Ambulatory Visit (INDEPENDENT_AMBULATORY_CARE_PROVIDER_SITE_OTHER): Payer: Medicare PPO | Admitting: Family Medicine

## 2020-05-30 ENCOUNTER — Other Ambulatory Visit: Payer: Self-pay

## 2020-05-30 ENCOUNTER — Encounter: Payer: Self-pay | Admitting: Family Medicine

## 2020-05-30 VITALS — BP 119/66 | HR 77 | Temp 98.2°F | Ht 65.0 in | Wt 144.0 lb

## 2020-05-30 DIAGNOSIS — F418 Other specified anxiety disorders: Secondary | ICD-10-CM | POA: Diagnosis not present

## 2020-05-30 DIAGNOSIS — M47816 Spondylosis without myelopathy or radiculopathy, lumbar region: Secondary | ICD-10-CM | POA: Diagnosis not present

## 2020-05-30 DIAGNOSIS — I10 Essential (primary) hypertension: Secondary | ICD-10-CM

## 2020-05-30 DIAGNOSIS — M533 Sacrococcygeal disorders, not elsewhere classified: Secondary | ICD-10-CM

## 2020-05-30 MED ORDER — PAROXETINE HCL 20 MG PO TABS: 20.0000 mg | ORAL_TABLET | Freq: Every day | ORAL | 1 refills | Status: DC

## 2020-05-30 MED ORDER — LORAZEPAM 0.5 MG PO TABS: 0.5000 mg | ORAL_TABLET | Freq: Two times a day (BID) | ORAL | 5 refills | Status: DC | PRN

## 2020-05-30 NOTE — Progress Notes (Signed)
Christina [name] , Mar 31, 1941, 79 y.o., female MRN: [id number] Patient Care Team    Relationship Specialty Notifications Start End  Ma Hillock, DO PCP - General Family Medicine  09/02/17   Rolm Bookbinder, MD Consulting Physician General Surgery  10/05/16   Monna Fam, MD Consulting Physician Ophthalmology  10/05/16   Garry Heater, DDS Consulting Physician Dentistry  10/05/16   Juanita Craver, MD Consulting Physician Gastroenterology  09/02/17   Volanda Napoleon, MD Medical Oncologist Oncology  05/14/19   Cordelia Poche, RN Oncology Nurse Navigator   05/26/20     Chief Complaint  Patient presents with  . Hypotension    pt brought BP readings; no complaints  . Back Pain    pt c/o low back pain x 2 weeks     Subjective:  Christina [name]  is a 79 y.o. female presents today for follow up [facility] with acute concern  Situational anxiety:  Was started on Paxil by her oncology team at the onset of diagnosis of bladder cancer. She does feel that it has been helpful along with the Ativan 0.5 mg twice daily. She reports she had a provider tell her that Zoloft is better for her than Paxil and she wanted to try zoloft. Switch was made last appt and she feels paxil was a better fit for her. She does have trouble sleeping some nights. She reports ativan  Mg qhs is usually helpful but sometimes she requires additional coverage.   Hypertension:  Pt reports her BP readings have been in normal range without needing any bp medication. She brings readings in with her today. She has lost a good deal of weight.   Tailbone pain: pt reports new onset tailbone pain of 2 weeks. Pain with sitting only. No fever, chills or skin break down noted. Recent surgical procedure and cancer treatment for bladder cancer.   Recent Labs  Lab 05/26/20 0955  HGB 12.0  HCT 36.5  WBC 5.3  PLT 178   CMP Latest Ref Rng & Units 05/26/2020 01/13/2020 11/04/2019  Glucose 70 - 99 mg/dL 93 151(H) 113(H)  BUN 8 - 23 mg/dL  18 25(H) 18  Creatinine 0.44 - 1.00 mg/dL 0.94 0.98 0.83  Sodium 135 - 145 mmol/L 141 138 140  Potassium 3.5 - 5.1 mmol/L 4.2 3.0(LL) 4.3  Chloride 98 - 111 mmol/L 107 98 105  CO2 22 - 32 mmol/L 28 31 29   Calcium 8.9 - 10.3 mg/dL 10.5(H) 9.2 10.5(H)  Total Protein 6.5 - 8.1 g/dL 6.4(L) 5.8(L) 6.9  Total Bilirubin 0.3 - 1.2 mg/dL 0.6 1.2 0.8  Alkaline Phos 38 - 126 U/L 61 73 72  AST 15 - 41 U/L 16 15 12(L)  ALT 0 - 44 U/L 9 19 9     No results found.   Depression screen [facility] 2/9 05/30/2020 01/14/2020 09/16/2019 06/05/2018 03/06/2018  Decreased Interest 0 0 0 0 0  Down, Depressed, Hopeless 1 0 1 0 0  PHQ - 2 Score 1 0 1 0 0  Altered sleeping 0 - - - -  Tired, decreased energy 1 - - - -  Change in appetite 0 - - - -  Feeling bad or failure about yourself  0 - - - -  Trouble concentrating 0 - - - -  Moving slowly or fidgety/restless 0 - - - -  Suicidal thoughts 0 - - - -  PHQ-9 Score 2 - - - -  Some recent data might be hidden  Allergies  Allergen Reactions  . Nutrasweet Aspartame [Aspartame] Diarrhea  . Lisinopril Cough   Social History   Tobacco Use  . Smoking status: Former Smoker    Packs/day: 1.00    Years: 30.00    Pack years: 30.00    Types: Cigarettes    Quit date: 08/27/1990    Years since quitting: 29.7  . Smokeless tobacco: Never Used  Substance Use Topics  . Alcohol use: No   Past Medical History:  Diagnosis Date  . Arthritis   . Asthma    a little?, no problems in several years  . Bladder cancer ([place])   . Bladder tumor   . Cervical cancer screening 01/31/2012  . Chicken pox as a child  . Degenerative tear of acetabular labrum of left hip 02/26/2019  . Depression with anxiety 06/07/2009   Qualifier: Diagnosis of  By: [name] MD, [name]    . Dermatitis of external ear 07/11/2012  . Diverticulosis   . Ganglion cyst 02/26/2019   Right elbow  . Hiatal hernia with gastroesophageal reflux 10/26/2010   Qualifier: Diagnosis of  By: [name] MD, [name]     . Hyperglycemia 06/21/2013  . Hyperlipidemia, mixed 10/16/2015   pt unaware  . Hypertension   . Left hip pain 07/10/2015  . Lesion of breast 12/03/2016   benign; resolved  . Low back pain 05/29/2015  . Measles as a child  . Mumps as a child  . Osteopenia 01/03/2017  . Overweight (BMI 25.0-29.9) 10/07/2008   Qualifier: Diagnosis of  By: [name] MD, [name]    . Palpitations    several years ago, not currently  . Pre-diabetes   . Psoriasis    ears  . RUQ pain 05/29/2015  . Spider veins    Bilateral legs  . Spinal stenosis   . Tailor's bunionette, left 09/02/2017  . Umbilical hernia   . Urinary frequency 09/28/2011  . Vertigo   . Wears dentures    Upper,   Past Surgical History:  Procedure Laterality Date  . ABDOMINAL SURGERY  1970's  . BREAST BIOPSY Right 1980   BREAST EXCISIONAL BIOPSY  . BREAST BIOPSY Left 1970   BREAST EXCISIONAL BIOPSY  . BREAST CYST ASPIRATION    . CATARACT EXTRACTION, BILATERAL    . COLONOSCOPY    . CYSTECTOMY     abdomen  . CYSTOSCOPY W/ RETROGRADES Bilateral 04/27/2019   Procedure: CYSTOSCOPY WITH RETROGRADE PYELOGRAM;  Surgeon: Cleon Gustin, MD;  Location: [facility];  Service: Urology;  Laterality: Bilateral;  . EYE SURGERY Bilateral    cataract  . INSERTION OF MESH N/A 06/11/2017   Procedure: INSERTION OF MESH;  Surgeon: Rolm Bookbinder, MD;  Location: [place];  Service: General;  Laterality: N/A;  BILATERAL TAP BLOCK  . TRANSURETHRAL RESECTION OF BLADDER TUMOR N/A 04/27/2019   Procedure: TRANSURETHRAL RESECTION OF BLADDER TUMOR (TURBT);  Surgeon: Cleon Gustin, MD;  Location: [facility];  Service: Urology;  Laterality: N/A;  1 HR  . TRANSURETHRAL RESECTION OF BLADDER TUMOR N/A 06/01/2019   Procedure: TRANSURETHRAL RESECTION OF BLADDER TUMOR (TURBT);  Surgeon: Cleon Gustin, MD;  Location: [facility];  Service: Urology;  Laterality: N/A;  1 HR  . TRANSURETHRAL RESECTION OF  BLADDER TUMOR N/A 10/22/2019   Procedure: TRANSURETHRAL RESECTION OF BLADDER TUMOR (TURBT);  Surgeon: Cleon Gustin, MD;  Location: [facility];  Service: Urology;  Laterality: N/A;  . TUBAL LIGATION  1970  . VENTRAL HERNIA  REPAIR N/A 06/11/2017   Procedure: LAPAROSCOPIC VENTRAL HERNIA REPAIR WITH MESH ERAS PATHWAY;  Surgeon: Rolm Bookbinder, MD;  Location: [place];  Service: General;  Laterality: N/A;  BILATERAL TAP BLOCK   Family History  Problem Relation Age of Onset  . Hypertension Mother   . Anxiety disorder Mother   . Ovarian cancer Mother 42       lung- smoker, ovarian  . Lung cancer Mother        smoker  . Arthritis Mother   . Hyperlipidemia Mother   . Alcohol abuse Father   . Diabetes Sister   . Hypertension Sister   . Ovarian cancer Sister   . Arthritis Sister   . Depression Sister   . Hyperlipidemia Sister   . Other Maternal Grandmother        pacemaker  . Multiple sclerosis Maternal Grandfather        ?  [name] Arthritis Brother   . COPD Brother   . Heart attack Brother   . Hyperlipidemia Brother   . Stroke Brother   . Asthma Sister   . Depression Sister   . Hyperlipidemia Sister   . Breast cancer Sister   . Asthma Sister   . Depression Sister   . Asthma Paternal Uncle    Allergies as of 05/30/2020      Reactions   Nutrasweet Aspartame [aspartame] Diarrhea   Lisinopril Cough      Medication List       Accurate as of May 30, 2020 12:32 PM. If you have any questions, ask your nurse or doctor.        STOP taking these medications   enoxaparin 40 MG/0.4ML injection Commonly known as: LOVENOX Stopped by: Howard Pouch, DO   fentaNYL 12 MCG/HR Commonly known as: DURAGESIC Stopped by: Howard Pouch, DO   furosemide 20 MG tablet Commonly known as: LASIX Stopped by: Howard Pouch, DO   losartan 50 MG tablet Commonly known as: COZAAR Stopped by: Howard Pouch, DO   meclizine 25 MG tablet Commonly known as: ANTIVERT Stopped by:  Howard Pouch, DO   sertraline 25 MG tablet Commonly known as: Zoloft Stopped by: Howard Pouch, DO   triamcinolone cream 0.1 % Commonly known as: KENALOG Stopped by: Howard Pouch, DO     TAKE these medications   famotidine 20 MG tablet Commonly known as: PEPCID Take by mouth.   First-Mouthwash BLM Susp Swish and spit 5 mLs every 6 (six) hours as needed   lactulose 10 GM/15ML solution Commonly known as: CHRONULAC Take by mouth.   LORazepam 0.5 MG tablet Commonly known as: ATIVAN Take 1 tablet (0.5 mg total) by mouth 2 (two) times daily as needed for anxiety.   magnesium oxide 400 MG tablet Commonly known as: MAG-OX Take by mouth.   PARoxetine 20 MG tablet Commonly known as: Paxil Take 1 tablet (20 mg total) by mouth daily. Started by: Howard Pouch, DO   potassium chloride SA 20 MEQ tablet Commonly known as: KLOR-CON Take 2 tablets (40 mEq total) by mouth daily.       All past medical history, surgical history, allergies, family history, immunizations and medications were updated in the EMR today and reviewed under the history and medication portions of their EMR.      ROS: Negative, with the exception of above mentioned in HPI   Objective:  BP 119/66   Pulse 77   Temp 98.2 F (36.8 C) (Oral)   Ht 5\' 5"  (1.651 m)   Wt  144 lb (65.3 kg)   SpO2 97%   BMI 23.96 kg/m  Body mass index is 23.96 kg/m. Gen: Afebrile. No acute distress. Very pleasant female.  HENT: AT. .  Eyes:Pupils Equal Round Reactive to light, Extraocular movements intact,  Conjunctiva without redness, discharge or icterus. CV: RRR no murmur, no edema, +2/4 P posterior tibialis pulses Chest: CTAB, no wheeze or crackles  Skin: Mild TTP tip of coccyx. No skin breakdown noted. Very mild redness over area without skin sensitivity. No drainage.  Neuro:  Normal gait. PERLA. EOMi. Alert. Oriented X3  Psych: Normal affect, dress and demeanor. Normal speech. Normal thought content and judgment..     Assessment/Plan: Christina [name] is a 79 y.o. female present for OV for Hospital discharge follow up Essential hypertension, benign Stable- off all medications. No need to restart regimen.   Coccyx pain Encouraged her to purchase a large "donut" to off set  pressure to area when sitting. Encouraged monitoring of location daily to ensure no skin breakdown occurs.  Xray ordered today.   - DG Sacrum/Coccyx; Future  Situational anxiety Patient had bladder cancer diagnosis and radical cystectomy. Switched back to paxil today per pt preference.  Continue  Ativan 0.5 mg-1 mg qhs  daily as needed (can be qhs if needed- must days 0.5 mg works well enough for her).  Next appt 5.5 months unless needed sooner.      Reviewed expectations re: course of current medical issues.  Discussed self-management of symptoms.  Outlined signs and symptoms indicating need for more acute intervention.  Patient verbalized understanding and all questions were answered.  Patient received an After-Visit Summary.  Any changes in medications were reviewed and patient was provided with updated med list with their AVS.      Orders Placed This Encounter  Procedures  . DG Sacrum/Coccyx   Meds ordered this encounter  Medications  . PARoxetine (PAXIL) 20 MG tablet    Sig: Take 1 tablet (20 mg total) by mouth daily.    Dispense:  90 tablet    Refill:  1    DC zoloft, med change  . LORazepam (ATIVAN) 0.5 MG tablet    Sig: Take 1 tablet (0.5 mg total) by mouth 2 (two) times daily as needed for anxiety.    Dispense:  60 tablet    Refill:  5      Note is dictated utilizing voice recognition software. Although note has been proof read prior to signing, occasional typographical errors still can be missed. If any questions arise, please do not hesitate to call for verification.   electronically signed by:  Howard Pouch, DO  [place]

## 2020-05-30 NOTE — Patient Instructions (Addendum)
Please have xray of tailbone.  Relieve pressure from tail bone with a large "donut" or position to ensure to break down of skin.   You do not need to start any of your BP pills- it looks great without.   Discontinue from zoloft and restart paxil.     Tailbone Injury The tailbone is the small bone at the lower end of the backbone (spine). The tailbone can become injured from:  A fall.  Sitting to row or bike for a long time.  Having a baby. This type of injury can be painful. Most tailbone injuries get better on their own in 4-6 weeks. Follow these instructions at home: Activity  Avoid sitting in one place for a long time.  Wear proper pads and gear when riding a bike or rowing.  Increase your activity as the pain allows.  Do exercises as told by your doctor or physical therapist. Managing pain, stiffness, and swelling  To lessen pain: ? Sit on a large, rubber or inflated ring or cushion. ? Lean forward when you sit.  If told, apply ice to the injured area. ? Put ice in a plastic bag. ? Place a towel between your skin and the bag. ? Leave the ice on for 20 minutes, 2-3 times per day. Do this for the first 1-2 days.  If told, put heat on the injured area. Do this as often as told by your doctor. Use the heat source that your doctor recommends, such as a moist heat pack or a heating pad. ? Place a towel between your skin and the heat source. ? Leave the heat on for 20-30 minutes. ? Remove the heat if your skin turns bright red. This is very important if you are unable to feel pain, heat, or cold. You may have a greater risk of getting burned. General instructions  Take over-the-counter and prescription medicines only as told by your doctor.  To prevent or treat trouble pooping (constipation) or pain when pooping, your doctor may suggest that you: ? Drink enough fluid to keep your pee (urine) pale yellow. ? Eat foods that are high in fiber. These include fresh fruits and  vegetables, whole grains, and beans. ? Limit foods that are high in fat and sugar. These include fried and sweet foods. ? Take an over-the-counter or prescription medicine to treat trouble pooping.  Keep all follow-up visits as told by your doctor. This is important. Contact a doctor if:  Your pain gets worse.  Pooping causes you pain.  You cannot poop after 4 days.  You have pain during sex. Summary  A tailbone injury can happen from a fall, from sitting for a long time to row or bike, or after having a baby.  These injuries can be painful. Most tailbone injuries get better on their own in 4-6 weeks.  Sit on a large, rubber or inflated ring or cushion to lessen pain.  Avoid sitting in one place for a long time.  Follow your doctor's suggestions to prevent or treat trouble pooping. This information is not intended to replace advice given to you by your health care provider. Make sure you discuss any questions you have with your health care provider. Document Revised: 09/10/2017 Document Reviewed: 09/10/2017 Elsevier Patient Education  2020 [employer].

## 2020-06-03 ENCOUNTER — Ambulatory Visit
Admission: RE | Admit: 2020-06-03 | Discharge: 2020-06-03 | Disposition: A | Discharge status: Home / Self Care | Payer: Medicare PPO | Source: Ambulatory Visit | Attending: Hematology and Oncology | Admitting: Hematology and Oncology

## 2020-06-03 ENCOUNTER — Telehealth: Payer: Self-pay | Admitting: Family Medicine

## 2020-06-03 ENCOUNTER — Other Ambulatory Visit: Payer: Self-pay

## 2020-06-03 DIAGNOSIS — K9409 Other complications of colostomy: Secondary | ICD-10-CM | POA: Diagnosis not present

## 2020-06-03 DIAGNOSIS — C678 Malignant neoplasm of overlapping sites of bladder: Secondary | ICD-10-CM

## 2020-06-03 DIAGNOSIS — Z936 Other artificial openings of urinary tract status: Secondary | ICD-10-CM | POA: Diagnosis not present

## 2020-06-03 DIAGNOSIS — N6001 Solitary cyst of right breast: Secondary | ICD-10-CM | POA: Diagnosis not present

## 2020-06-03 DIAGNOSIS — R922 Inconclusive mammogram: Secondary | ICD-10-CM | POA: Diagnosis not present

## 2020-06-03 NOTE — Telephone Encounter (Signed)
Left message for patient to schedule Annual Wellness Visit.  Please schedule with Nurse Health Advisor Martha Stanley, RN at [place]  °

## 2020-06-04 IMAGING — DX LUMBAR SPINE - COMPLETE 4+ VIEW
5 series · 5 of 5 positions shown · non-contrast
Comparison: Plain films lumbar spine 05/20/2015.

CLINICAL DATA: Left upper leg pain since July 2018. No known
injury.

EXAM:
LUMBAR SPINE - COMPLETE 4+ VIEW

[l-spine ap]
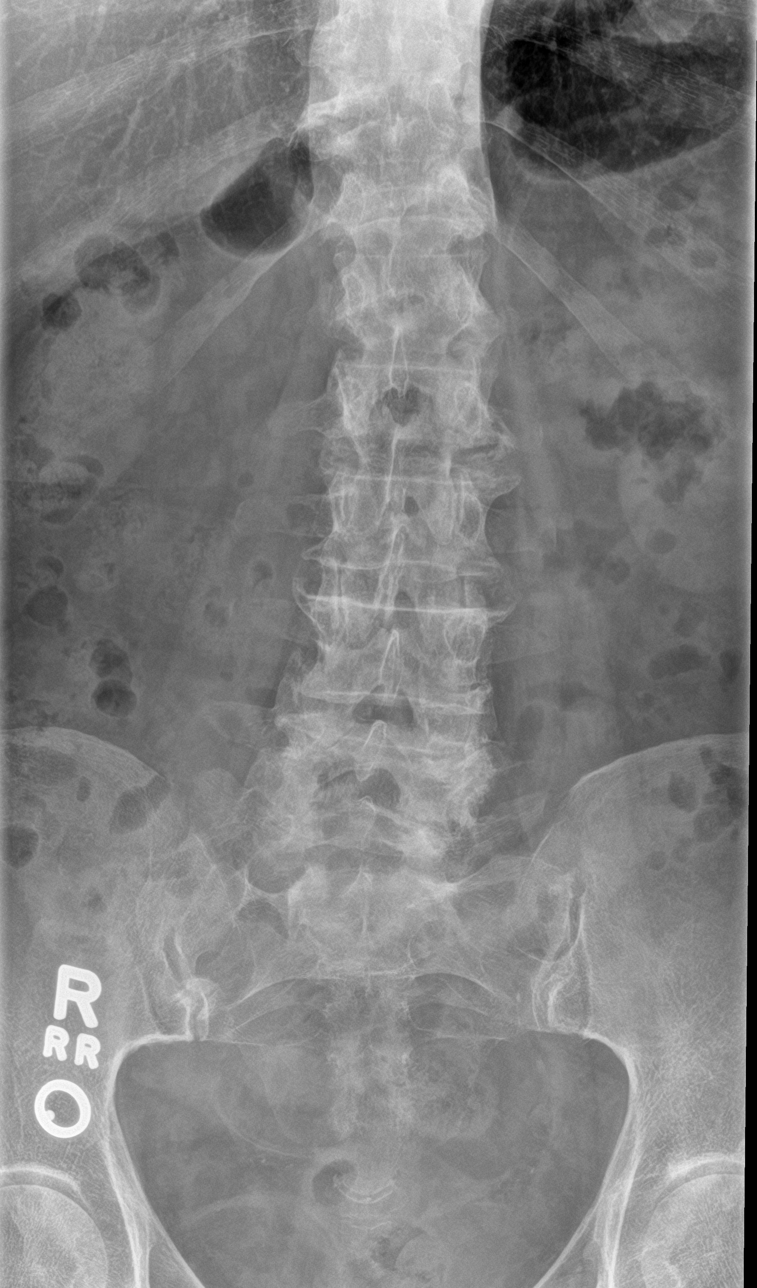

[l-spine obl (1 of 2)]
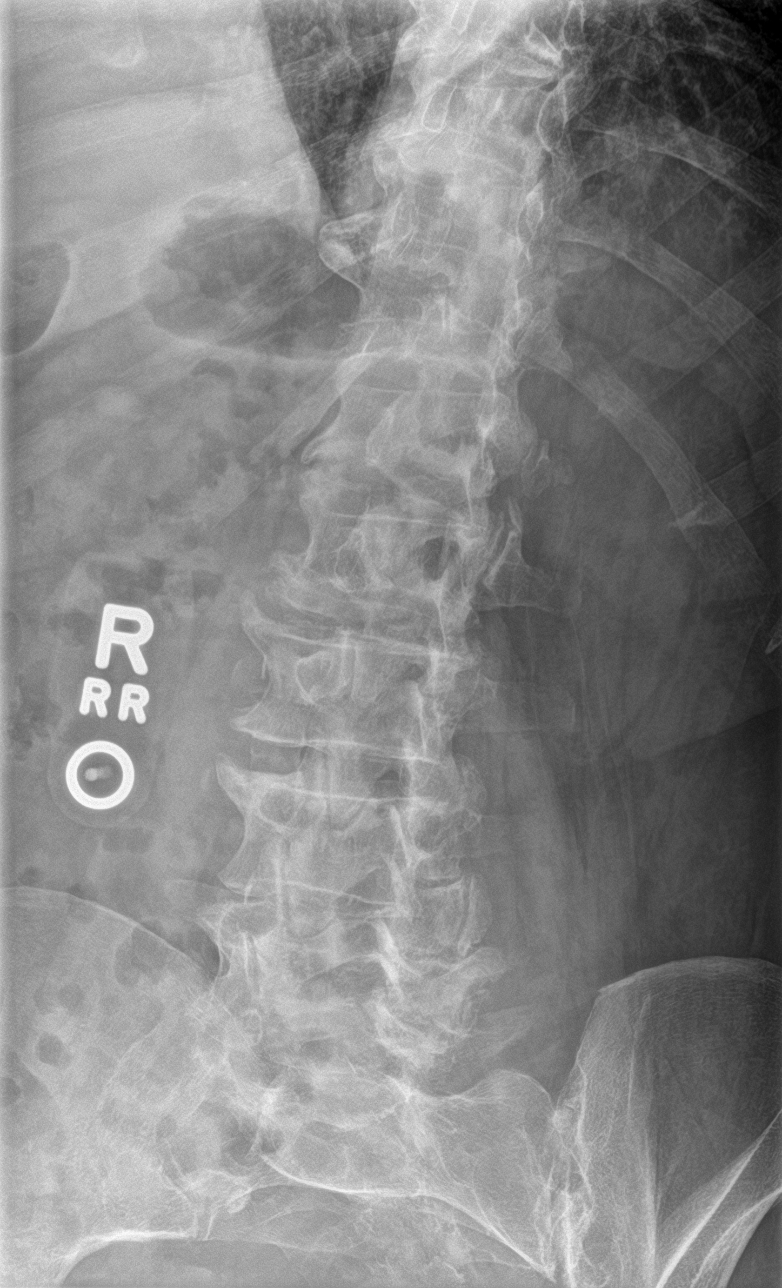

[l-spine obl (2 of 2)]
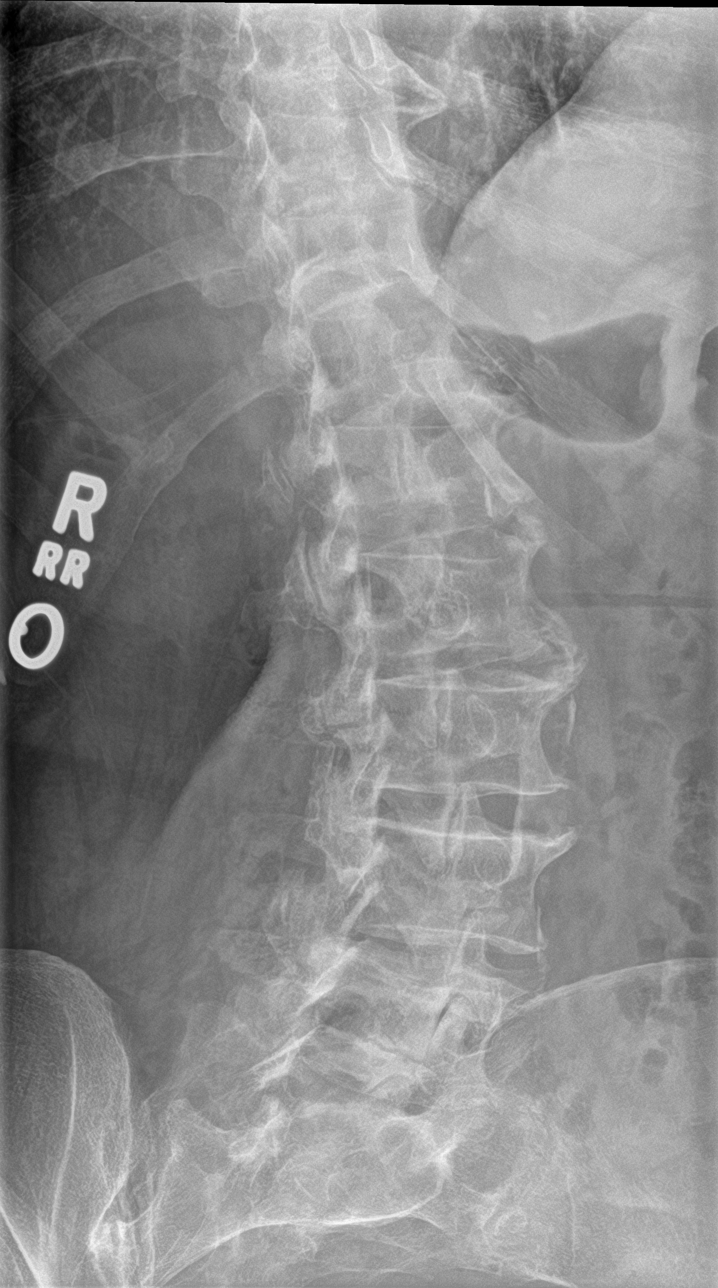

[l-spine lat]
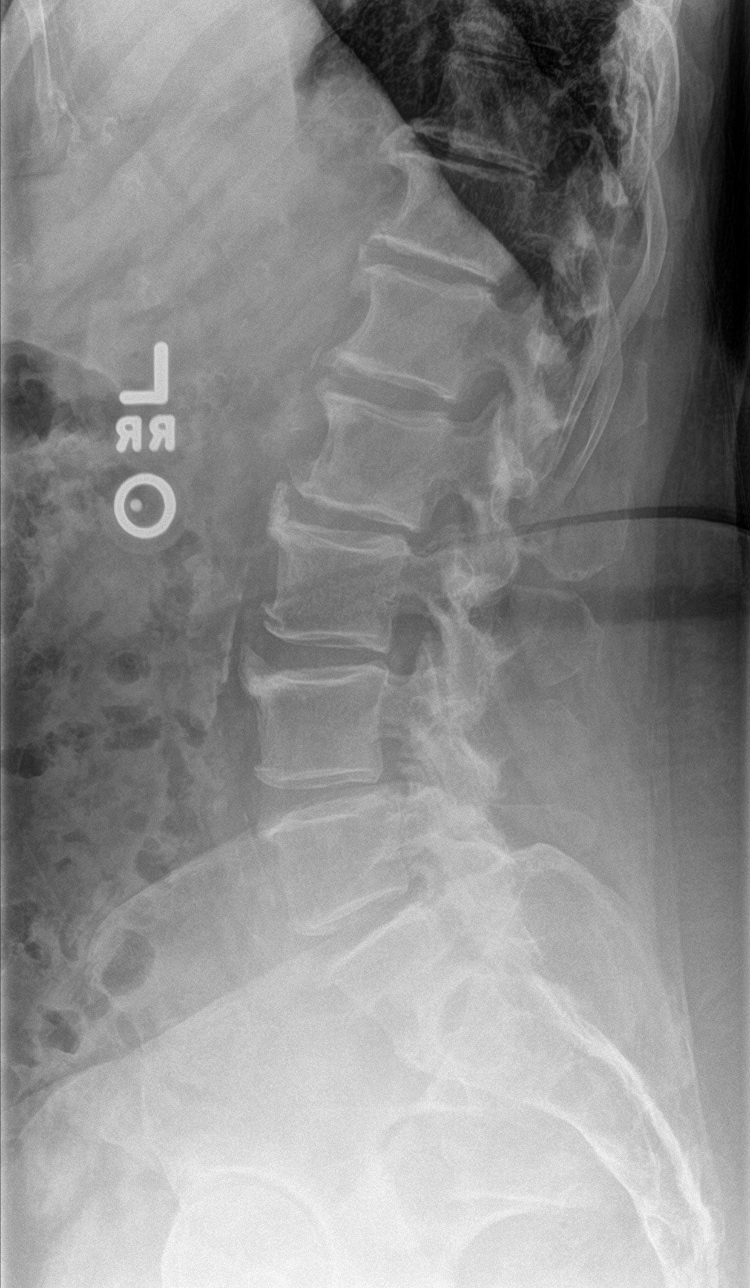

[l-spine spot]
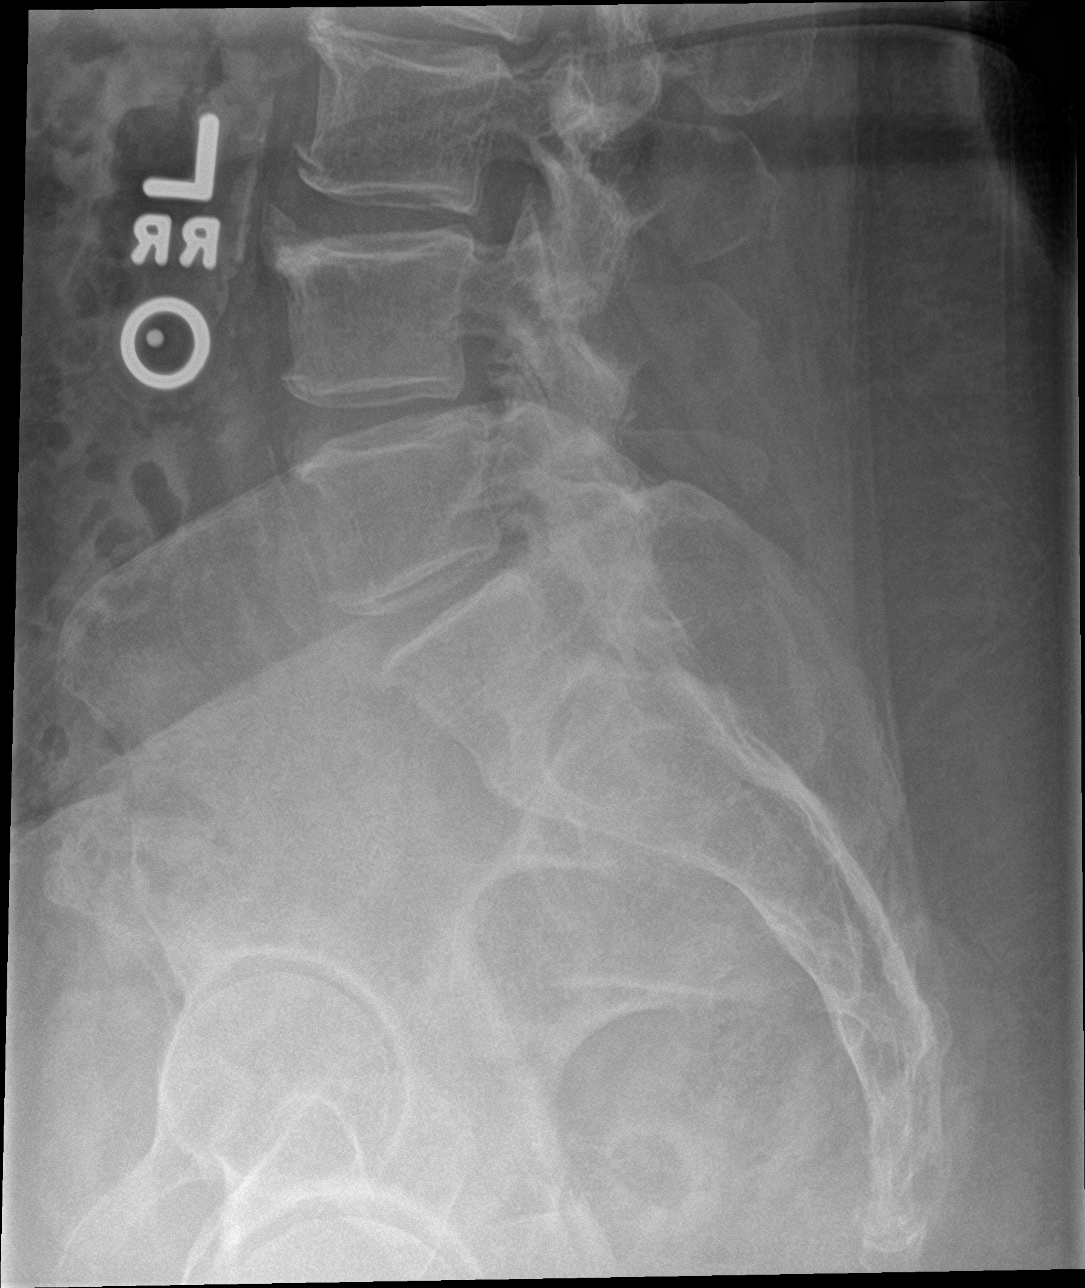

[5 of 5 positions shown; findings below may reference images not displayed]

FINDINGS: Vertebral body height and alignment are maintained. Intervertebral
disc space height is normal. Facet degenerative disease L3-4, L4-5
and L5-S1 appears unchanged. Scattered mild anterior endplate
spurring is noted. Paraspinous structures demonstrate
atherosclerosis.
IMPRESSION: No acute abnormality.

No change in lower lumbar facet degenerative disease.

## 2020-06-09 ENCOUNTER — Telehealth: Payer: Self-pay | Admitting: *Deleted

## 2020-06-09 ENCOUNTER — Encounter: Payer: Self-pay | Admitting: *Deleted

## 2020-06-09 ENCOUNTER — Inpatient Hospital Stay: Payer: Medicare PPO | Attending: Hematology & Oncology

## 2020-06-09 ENCOUNTER — Encounter (HOSPITAL_BASED_OUTPATIENT_CLINIC_OR_DEPARTMENT_OTHER): Payer: Self-pay

## 2020-06-09 ENCOUNTER — Ambulatory Visit (HOSPITAL_BASED_OUTPATIENT_CLINIC_OR_DEPARTMENT_OTHER)
Admission: RE | Admit: 2020-06-09 | Discharge: 2020-06-09 | Disposition: A | Discharge status: Home / Self Care | Payer: Medicare PPO | Source: Ambulatory Visit | Attending: Hematology & Oncology | Admitting: Hematology & Oncology

## 2020-06-09 ENCOUNTER — Other Ambulatory Visit: Payer: Self-pay

## 2020-06-09 VITALS — BP 121/68 | HR 74 | Temp 97.9°F | Resp 18

## 2020-06-09 DIAGNOSIS — M47816 Spondylosis without myelopathy or radiculopathy, lumbar region: Secondary | ICD-10-CM | POA: Diagnosis not present

## 2020-06-09 DIAGNOSIS — Z9221 Personal history of antineoplastic chemotherapy: Secondary | ICD-10-CM | POA: Diagnosis not present

## 2020-06-09 DIAGNOSIS — M47814 Spondylosis without myelopathy or radiculopathy, thoracic region: Secondary | ICD-10-CM | POA: Diagnosis not present

## 2020-06-09 DIAGNOSIS — C679 Malignant neoplasm of bladder, unspecified: Secondary | ICD-10-CM | POA: Diagnosis not present

## 2020-06-09 DIAGNOSIS — Z452 Encounter for adjustment and management of vascular access device: Secondary | ICD-10-CM | POA: Insufficient documentation

## 2020-06-09 DIAGNOSIS — Z79899 Other long term (current) drug therapy: Secondary | ICD-10-CM | POA: Insufficient documentation

## 2020-06-09 DIAGNOSIS — Z95828 Presence of other vascular implants and grafts: Secondary | ICD-10-CM

## 2020-06-09 DIAGNOSIS — I700 Atherosclerosis of aorta: Secondary | ICD-10-CM | POA: Diagnosis not present

## 2020-06-09 DIAGNOSIS — D3500 Benign neoplasm of unspecified adrenal gland: Secondary | ICD-10-CM | POA: Diagnosis not present

## 2020-06-09 DIAGNOSIS — K5730 Diverticulosis of large intestine without perforation or abscess without bleeding: Secondary | ICD-10-CM | POA: Diagnosis not present

## 2020-06-09 DIAGNOSIS — C670 Malignant neoplasm of trigone of bladder: Secondary | ICD-10-CM

## 2020-06-09 DIAGNOSIS — J432 Centrilobular emphysema: Secondary | ICD-10-CM | POA: Diagnosis not present

## 2020-06-09 MED ORDER — HEPARIN SOD (PORK) LOCK FLUSH 100 UNIT/ML IV SOLN
500.0000 [IU] | Freq: Once | INTRAVENOUS | Status: AC
  Administered 2020-06-09: 500 [IU] via INTRAVENOUS
  Filled 2020-06-09: qty 5

## 2020-06-09 MED ORDER — SODIUM CHLORIDE 0.9% FLUSH
10.0000 mL | Freq: Once | INTRAVENOUS | Status: AC
  Administered 2020-06-09: 10 mL via INTRAVENOUS
  Filled 2020-06-09: qty 10

## 2020-06-09 MED ORDER — IOHEXOL 300 MG/ML  SOLN
100.0000 mL | Freq: Once | INTRAMUSCULAR | Status: AC | PRN
  Administered 2020-06-09: 100 mL via INTRAVENOUS

## 2020-06-09 NOTE — Progress Notes (Signed)
Oncology Nurse Navigator Documentation  Oncology Nurse Navigator Flowsheets 06/09/2020  Abnormal Finding Date -  Confirmed Diagnosis Date -  Diagnosis Status -  Navigator Follow Up Date: 06/24/2020  Navigator Follow Up Reason: Follow-up Appointment  Navigation Complete Date: -  Post Navigation: Continue to Follow Patient? -  Reason Not Navigating Patient: -  Navigator Location [facility]  Referral Date to RadOnc/MedOnc -  Navigator Encounter Type Scan Review  Telephone -  Patient Visit Type MedOnc  Treatment Phase Post-Tx Follow-up  Barriers/Navigation Needs No Barriers At This Time  Interventions None Required  Acuity Level 1-No Barriers  Coordination of Care -  Support Groups/Services Friends and Family  Time Spent with Patient 15

## 2020-06-09 NOTE — Telephone Encounter (Signed)
-----   Message from Volanda Napoleon, MD sent at 06/09/2020 12:42 PM EDT ----- Call - the CT scan does not show any obvious cancer.  Laurey Arrow

## 2020-06-09 NOTE — Telephone Encounter (Signed)
Message left on patient's private home number to let her know per order of Dr. Marin Olp that "the CT scan does not show any obvious cancer. Pete"  Instructed pt to call office back with any questions or concerns.

## 2020-06-09 NOTE — Patient Instructions (Signed)
Tunneled Central Venous Catheter Flushing Guide  It is important to flush your tunneled central venous catheter each time you use it, both before and after you use it. Flushing your catheter will help prevent it from clogging. What are the risks? Risks may include:  Infection.  Air getting into the catheter and bloodstream. Supplies needed:  A clean pair of gloves.  A disinfecting wipe. Use an alcohol wipe, chlorhexidine wipe, or iodine wipe as told by your health care provider.  A 10 mL syringe that has been prefilled with saline solution.  An empty 10 mL syringe, if a substance called heparin was injected into your catheter. How to flush your catheter When you flush your catheter, make sure you follow any specific instructions from your health care provider or the manufacturer. These are general guidelines. Flushing your catheter before use If there is heparin in your catheter: 1. Wash your hands with soap and water. 2. Put on gloves. 3. Scrub the injection cap for a minimum of 15 seconds with a disinfecting wipe. 4. Unclamp the catheter. 5. Attach the empty syringe to the injection cap. 6. Pull the syringe plunger back and withdraw 10 mL of blood. 7. Place the syringe into an appropriate waste container. 8. Scrub the injection cap for 15 seconds with a disinfecting wipe. 9. Attach the prefilled syringe to the injection cap. 10. Flush the catheter by pushing the plunger forward until all the liquid from the syringe is in the catheter. 11. Remove the syringe from the injection cap. 12. Clamp the catheter. If there is no heparin in your catheter: 1. Wash your hands with soap and water. 2. Put on gloves. 3. Scrub the injection cap for 15 seconds with a disinfecting wipe. 4. Unclamp the catheter. 5. Attach the prefilled syringe to the injection cap. 6. Flush the catheter by pushing the plunger forward until 5 mL of the liquid from the syringe is in the catheter. 7. Pull back on  the syringe until you see blood in the catheter. 8. If you have been asked to collect any blood, follow your health care provider's instructions. Otherwise, flush the catheter with the rest of the solution from the syringe. 9. Remove the syringe from the injection cap. 10. Clamp the catheter.  Flushing your catheter after use 1. Wash your hands with soap and water. 2. Put on gloves. 3. Scrub the injection cap for 15 seconds with a disinfecting wipe. 4. Unclamp the catheter. 5. Attach the prefilled syringe to the injection cap. 6. Flush the catheter by pushing the plunger forward until all of the liquid from the syringe is in the catheter. 7. Remove the syringe from the injection cap. 8. Clamp the catheter. Problems and solutions  If blood cannot be completely cleared from the injection cap, you may need to have the injection cap replaced.  If the catheter is difficult to flush, use the pulsing method. The pulsing method involves pushing only a few milliliters of solution into the catheter at a time and pausing between pushes.  If you do not see blood in the catheter when you pull back on the syringe, change your body position, such as by raising your arms above your head. Take a deep breath and cough. Then, pull back on the syringe. If you still do not see blood, flush the catheter with a small amount of solution. Then, change positions again and take a breath or cough. Pull back on the syringe again. If you still do not see   blood, finish flushing the catheter and contact your health care provider. Do not use your catheter until your health care provider says it is okay. General tips  Have someone help you flush your catheter, if possible.  Do not force fluid through your catheter.  Do not use a syringe that is larger or smaller than 10 mL. Using a smaller syringe can make the catheter burst.  Do not use your catheter without flushing it first if it has heparin in it. Contact a health  care provider if:  You cannot see any blood in the catheter when you flush it before using it.  Your catheter is difficult to flush. Get help right away if:  You cannot flush the catheter.  The catheter leaks when you flush it or when there is fluid in it.  There are cracks or breaks in the catheter. Summary  It is important to flush your tunneled central venous catheter each time you use it, both before and after you use it.  Scrub the injection cap for 15 seconds with a disinfecting wipe before and after you flush it.  When you flush your catheter, make sure you follow any specific instructions from your health care provider or the manufacturer.  Get help right away if you cannot flush the catheter. This information is not intended to replace advice given to you by your health care provider. Make sure you discuss any questions you have with your health care provider. Document Revised: 05/08/2019 Document Reviewed: 10/29/2018 Elsevier Patient Education  2020 [employer]  

## 2020-06-18 IMAGING — MR MRI OF THE LEFT HIP WITH CONTRAST
5 series · 40 of 40 positions shown · IV contrast (agent unspecified)
Comparison: Radiographs dated 01/08/2019

CLINICAL DATA: Left thigh pain.  Chronic hip pain.

EXAM:
MRI OF THE LEFT HIP WITH CONTRAST (MR Arthrogram)
TECHNIQUE: Multiplanar, multisequence MR imaging of the hip was performed
immediately following contrast injection into the hip joint under
fluoroscopic guidance. No intravenous contrast was administered.

[Series 6: T1 · coronal · 4.0mm · 0.85mm/px · 10 of 39 slices shown]
[im 1/39]
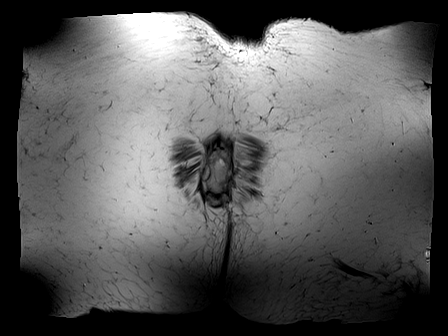
[im 5/39]
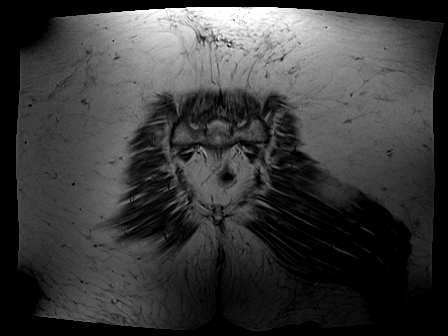
[im 9/39]
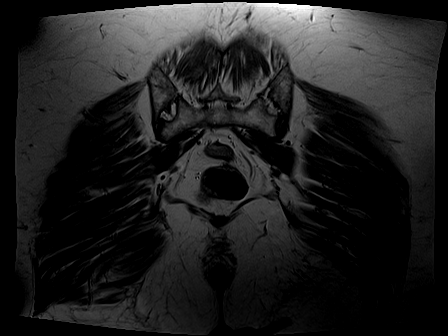
[im 13/39]
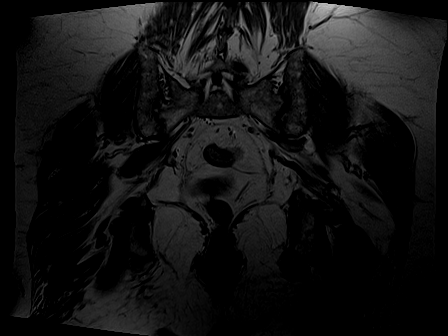
[im 17/39]
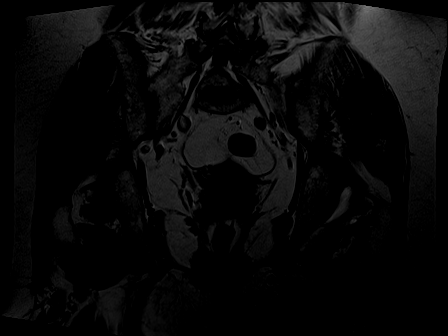
[im 22/39]
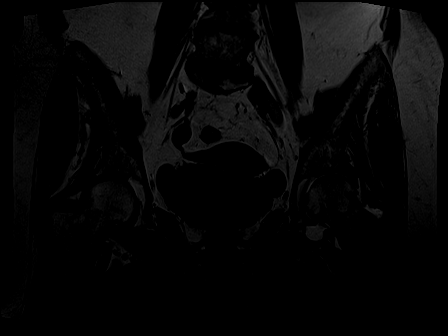
[im 26/39]
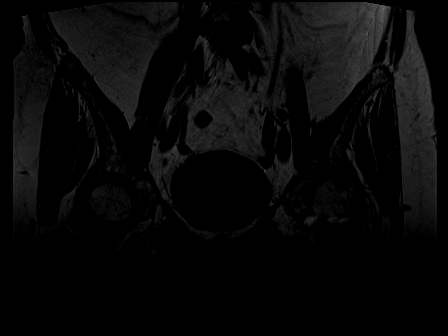
[im 30/39]
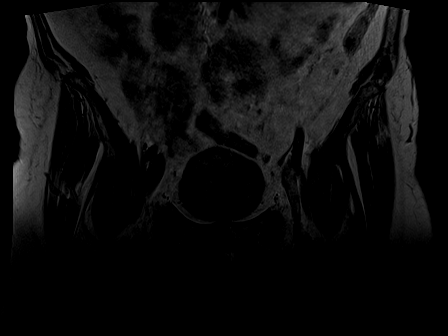
[im 34/39]
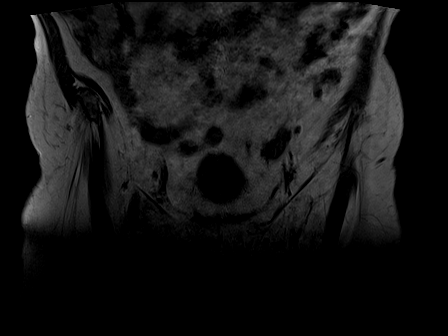
[im 39/39]
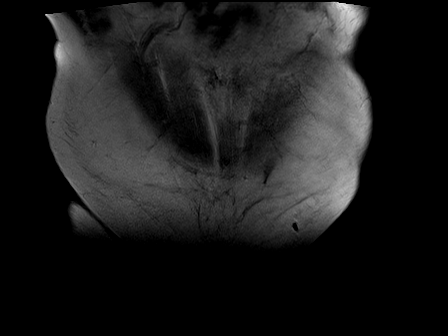

[Series 7: T1 fat-sat · oblique · 4.0mm · 0.70mm/px · 5 of 20 slices shown (1 of 3)]
[im 1/20]
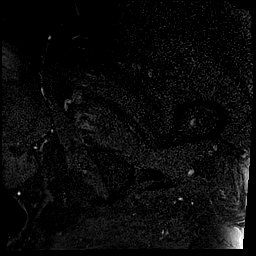
[im 5/20]
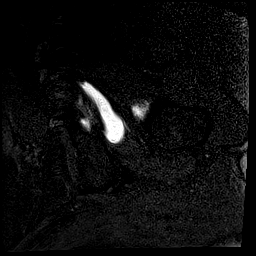
[im 10/20]
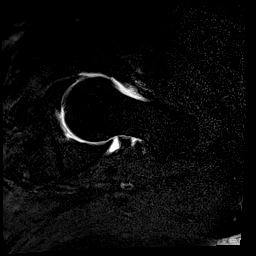
[im 15/20]
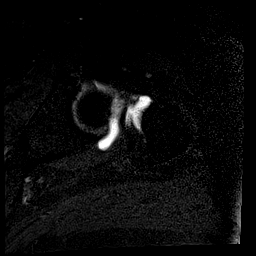
[im 20/20]
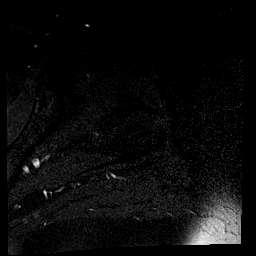

[Series 8: T2 fat-sat · coronal · 4.0mm · 0.99mm/px · 11 of 39 slices shown]
[im 1/39]
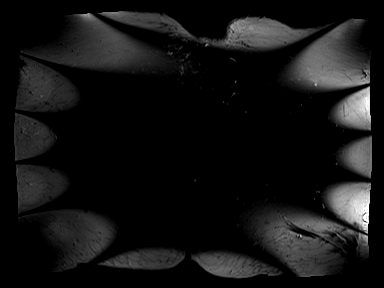
[im 4/39]
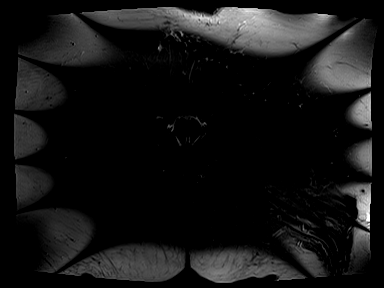
[im 8/39]
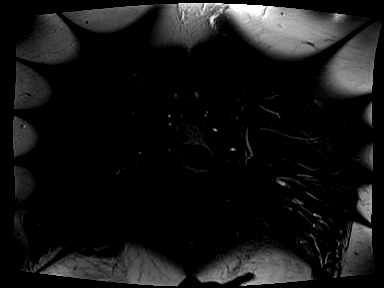
[im 12/39]
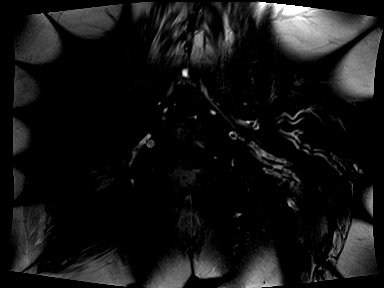
[im 16/39]
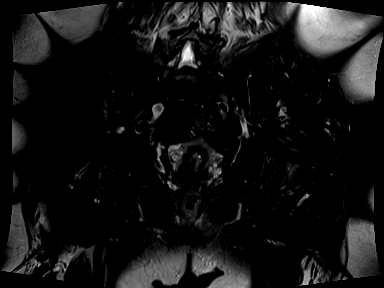
[im 20/39]
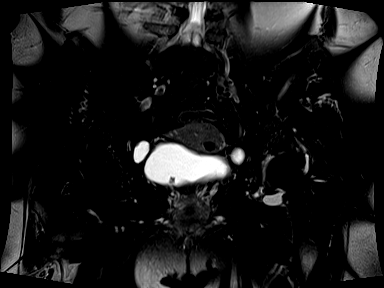
[im 23/39]
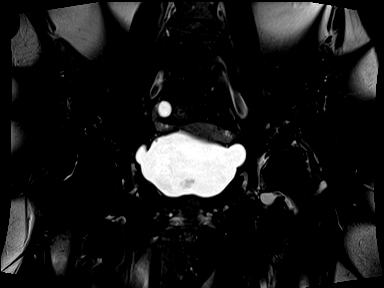
[im 27/39]
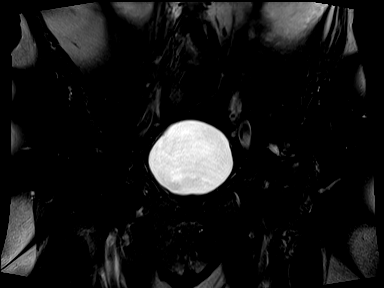
[im 31/39]
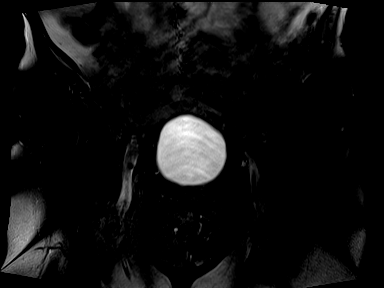
[im 35/39]
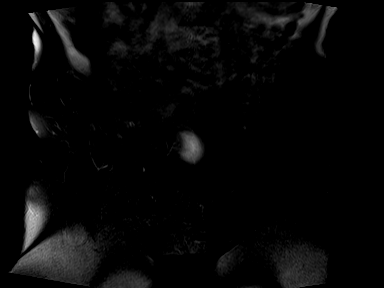
[im 39/39]
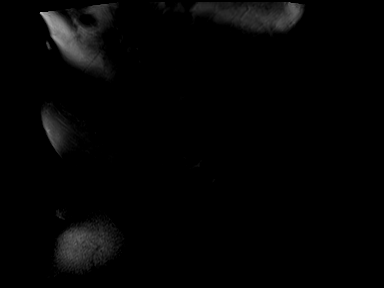

[Series 11: T1 fat-sat · sagittal · 4.0mm · 0.56mm/px · 7 of 25 slices shown (2 of 3)]
[im 1/25]
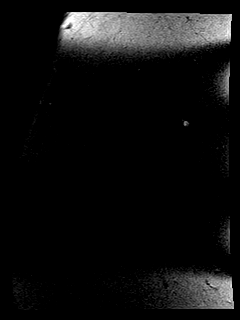
[im 5/25]
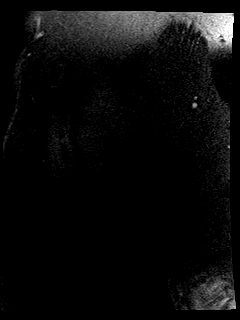
[im 9/25]
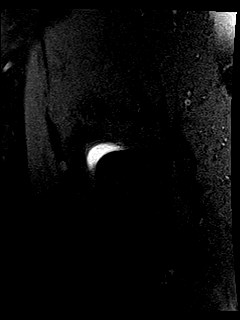
[im 13/25]
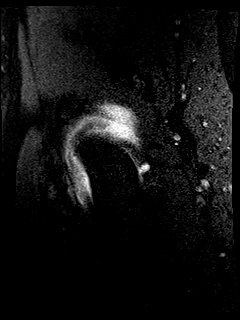
[im 17/25]
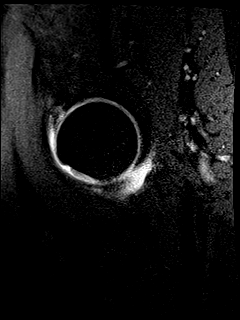
[im 21/25]
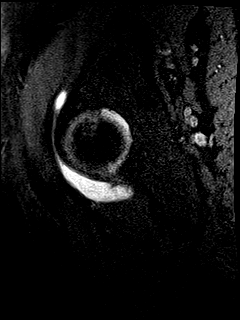
[im 25/25]
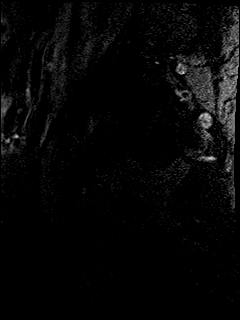

[Series 12: T1 fat-sat · coronal · 4.0mm · 0.70mm/px · 7 of 25 slices shown (3 of 3)]
[im 1/25]
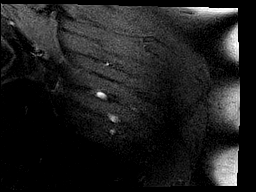
[im 5/25]
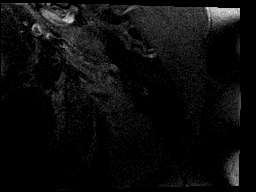
[im 9/25]
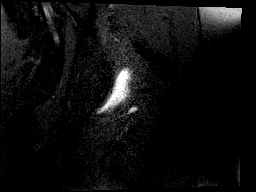
[im 13/25]
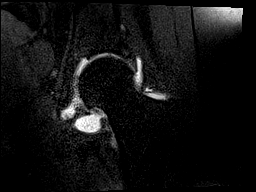
[im 17/25]
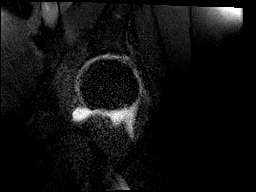
[im 21/25]
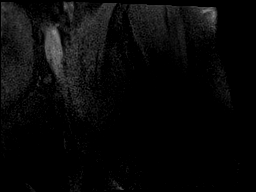
[im 25/25]
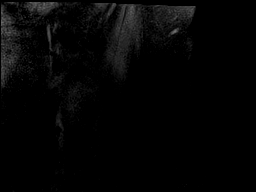

[40 of 40 positions shown; findings below may reference images not displayed]

FINDINGS: Labrum: There is a focal tear of the anterior aspect of the labrum
best seen on image 19 of series 11. Contrast extends through the
tear deep to the iliopsoas tendon. There is degeneration of the
superolateral aspect of the acetabulum without a tear in that area.

Bones: There are focal small cystic degenerative changes of the
anterior and superolateral aspects of the left acetabulum.

Note is made of moderate bilateral facet arthritis at L5-S1 and
L4-5.

Cartilage: There is slight thinning of the articular cartilage in
the anterior superior aspect of the acetabulum.

Muscles and tendons: Normal.

Soft tissues: No evidence of bursitis.
IMPRESSION: 1. Focal tear of the anterior aspect of the labrum of the left hip.
2. Arthritic changes of the anterior and superolateral aspects of
the left acetabulum with partial thickness cartilage loss.

## 2020-06-22 DIAGNOSIS — Z936 Other artificial openings of urinary tract status: Secondary | ICD-10-CM | POA: Diagnosis not present

## 2020-06-22 DIAGNOSIS — K9409 Other complications of colostomy: Secondary | ICD-10-CM | POA: Diagnosis not present

## 2020-06-24 ENCOUNTER — Inpatient Hospital Stay: Payer: Medicare PPO

## 2020-06-24 ENCOUNTER — Encounter: Payer: Self-pay | Admitting: *Deleted

## 2020-06-24 ENCOUNTER — Inpatient Hospital Stay (HOSPITAL_BASED_OUTPATIENT_CLINIC_OR_DEPARTMENT_OTHER): Payer: Medicare PPO | Admitting: Hematology & Oncology

## 2020-06-24 ENCOUNTER — Other Ambulatory Visit: Payer: Self-pay

## 2020-06-24 ENCOUNTER — Telehealth: Payer: Self-pay | Admitting: Hematology & Oncology

## 2020-06-24 ENCOUNTER — Encounter: Payer: Self-pay | Admitting: Hematology & Oncology

## 2020-06-24 VITALS — BP 126/65 | HR 81 | Temp 97.8°F | Resp 18 | Ht 65.0 in | Wt 144.0 lb

## 2020-06-24 DIAGNOSIS — C679 Malignant neoplasm of bladder, unspecified: Secondary | ICD-10-CM | POA: Diagnosis not present

## 2020-06-24 DIAGNOSIS — Z79899 Other long term (current) drug therapy: Secondary | ICD-10-CM | POA: Diagnosis not present

## 2020-06-24 DIAGNOSIS — C671 Malignant neoplasm of dome of bladder: Secondary | ICD-10-CM

## 2020-06-24 DIAGNOSIS — Z452 Encounter for adjustment and management of vascular access device: Secondary | ICD-10-CM | POA: Diagnosis not present

## 2020-06-24 DIAGNOSIS — Z9221 Personal history of antineoplastic chemotherapy: Secondary | ICD-10-CM | POA: Diagnosis not present

## 2020-06-24 DIAGNOSIS — Z95828 Presence of other vascular implants and grafts: Secondary | ICD-10-CM

## 2020-06-24 DIAGNOSIS — C670 Malignant neoplasm of trigone of bladder: Secondary | ICD-10-CM

## 2020-06-24 LAB — CBC WITH DIFFERENTIAL (CANCER CENTER ONLY)
Abs Immature Granulocytes: 0.01 10*3/uL (ref 0.00–0.07)
Basophils Absolute: 0 10*3/uL (ref 0.0–0.1)
Basophils Relative: 1 %
Eosinophils Absolute: 0.1 10*3/uL (ref 0.0–0.5)
Eosinophils Relative: 2 %
HCT: 35.8 % — ABNORMAL LOW (ref 36.0–46.0)
Hemoglobin: 11.7 g/dL — ABNORMAL LOW (ref 12.0–15.0)
Immature Granulocytes: 0 %
Lymphocytes Relative: 32 %
Lymphs Abs: 1.4 10*3/uL (ref 0.7–4.0)
MCH: 33.2 pg (ref 26.0–34.0)
MCHC: 32.7 g/dL (ref 30.0–36.0)
MCV: 101.7 fL — ABNORMAL HIGH (ref 80.0–100.0)
Monocytes Absolute: 0.4 10*3/uL (ref 0.1–1.0)
Monocytes Relative: 10 %
Neutro Abs: 2.5 10*3/uL (ref 1.7–7.7)
Neutrophils Relative %: 55 %
Platelet Count: 180 10*3/uL (ref 150–400)
RBC: 3.52 MIL/uL — ABNORMAL LOW (ref 3.87–5.11)
RDW: 12.1 % (ref 11.5–15.5)
WBC Count: 4.4 10*3/uL (ref 4.0–10.5)
nRBC: 0 % (ref 0.0–0.2)

## 2020-06-24 LAB — CMP (CANCER CENTER ONLY)
ALT: 7 U/L (ref 0–44)
AST: 13 U/L — ABNORMAL LOW (ref 15–41)
Albumin: 3.8 g/dL (ref 3.5–5.0)
Alkaline Phosphatase: 66 U/L (ref 38–126)
Anion gap: 6 (ref 5–15)
BUN: 23 mg/dL (ref 8–23)
CO2: 26 mmol/L (ref 22–32)
Calcium: 10.6 mg/dL — ABNORMAL HIGH (ref 8.9–10.3)
Chloride: 105 mmol/L (ref 98–111)
Creatinine: 1.02 mg/dL — ABNORMAL HIGH (ref 0.44–1.00)
GFR, Estimated: 56 mL/min — ABNORMAL LOW (ref >60)
Glucose, Bld: 126 mg/dL — ABNORMAL HIGH (ref 70–99)
Potassium: 4.2 mmol/L (ref 3.5–5.1)
Sodium: 137 mmol/L (ref 135–145)
Total Bilirubin: 0.6 mg/dL (ref 0.3–1.2)
Total Protein: 6.3 g/dL — ABNORMAL LOW (ref 6.5–8.1)

## 2020-06-24 LAB — LACTATE DEHYDROGENASE: LDH: 119 U/L (ref 98–192)

## 2020-06-24 IMAGING — MR MRI LUMBAR SPINE WITHOUT CONTRAST
4 of 5 series · 26 of 48 positions shown · non-contrast
Comparison: Radiography 02/09/2019

CLINICAL DATA: Left leg pain since injury in [REDACTED] of last year
after falling down steps.

EXAM:
MRI LUMBAR SPINE WITHOUT CONTRAST
TECHNIQUE: Multiplanar, multisequence MR imaging of the lumbar spine was
performed. No intravenous contrast was administered.

[Series 2: T2 · sagittal · 4.0mm · 0.81mm/px · 6 of 15 slices shown (1 of 2)]
[im 1/15]
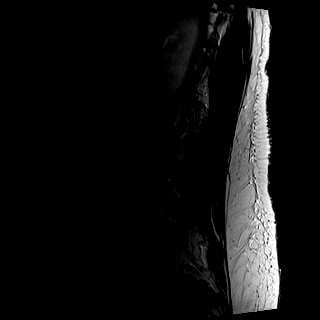
[im 3/15]
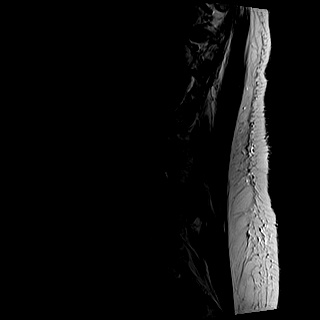
[im 6/15]
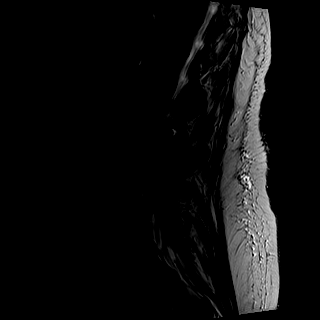
[im 9/15]
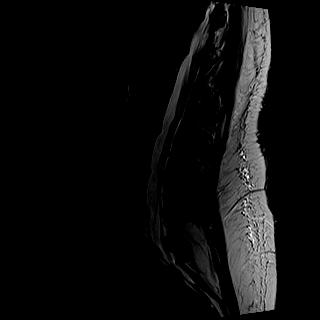
[im 12/15]
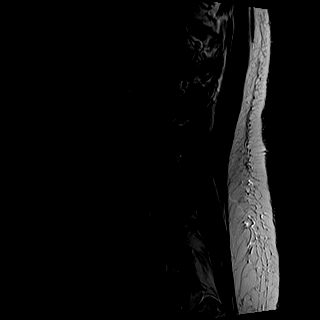
[im 15/15]
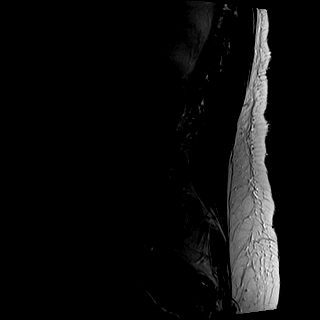

[Series 3: T1 · sagittal · 4.0mm · 0.41mm/px · 6 of 15 slices shown (1 of 2)]
[im 1/15]
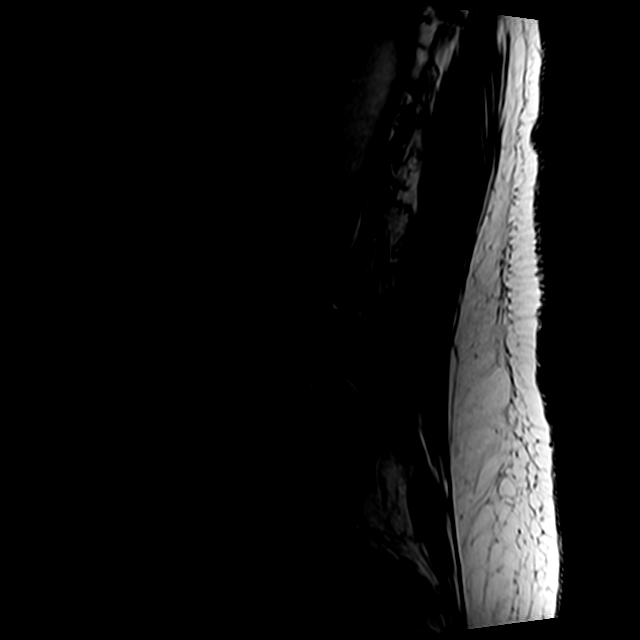
[im 3/15]
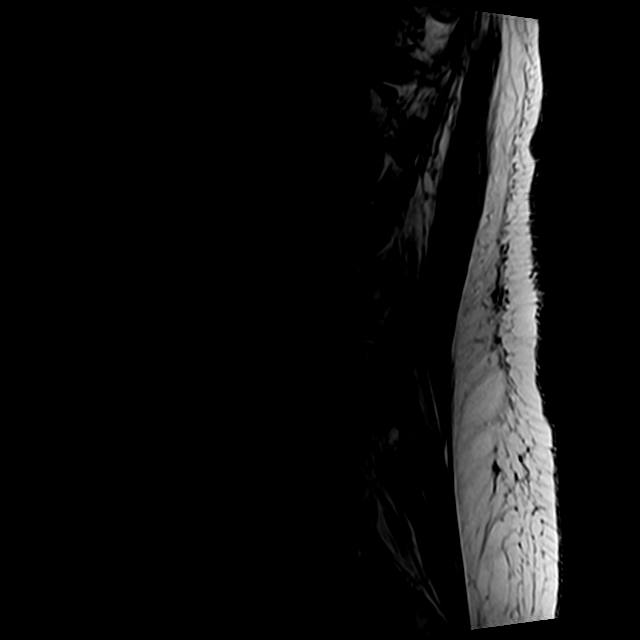
[im 6/15]
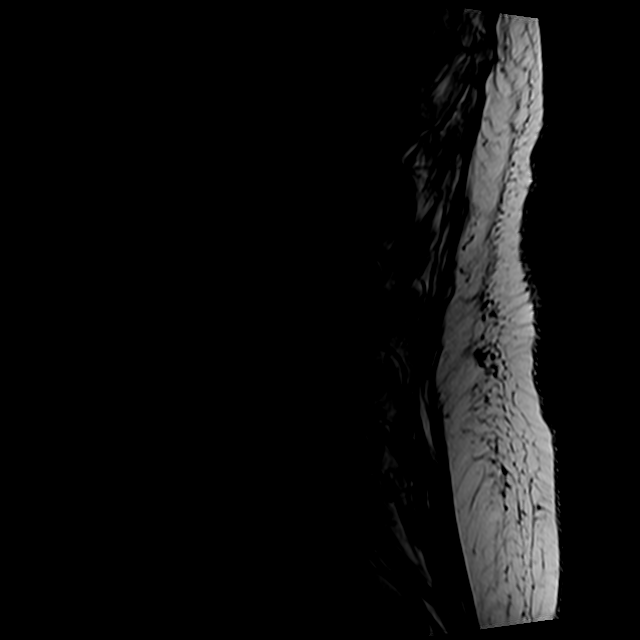
[im 9/15]
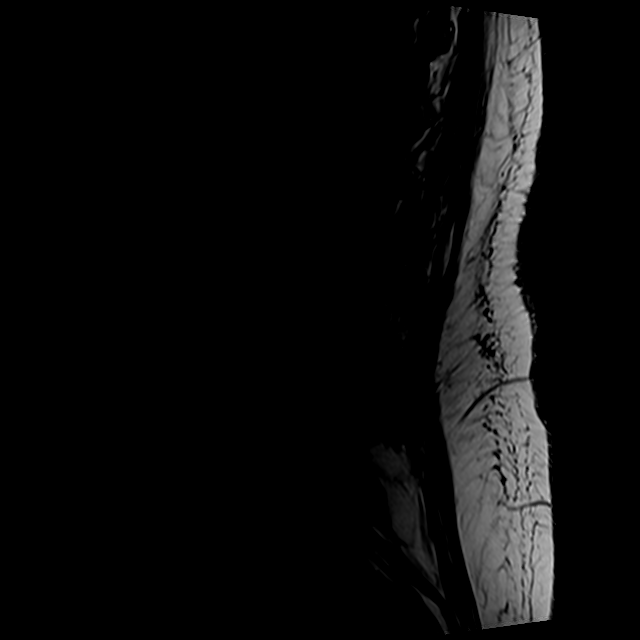
[im 12/15]
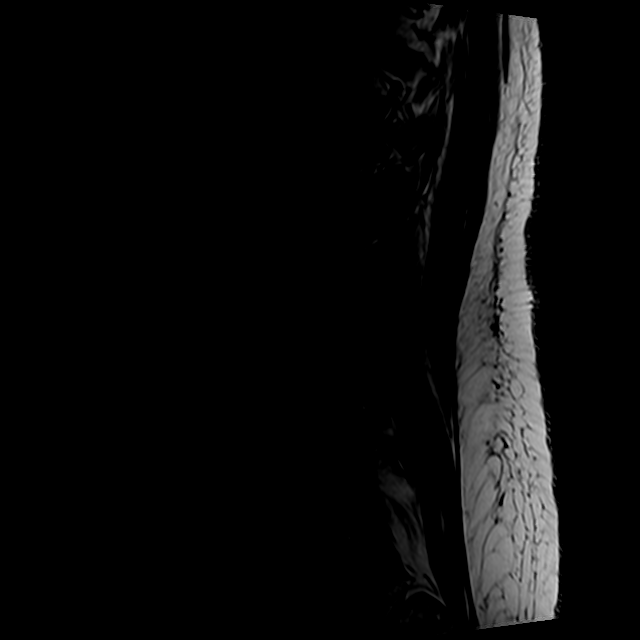
[im 15/15]
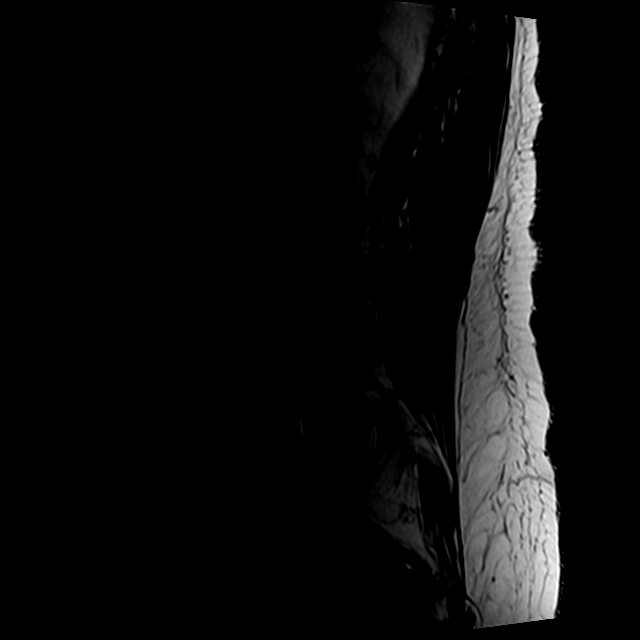

[Series 5: T2 · axial · 4.0mm · 0.78mm/px · z∈[-195,+15]mm · 9 of 38 slices shown (2 of 2)]
[im 1/38]
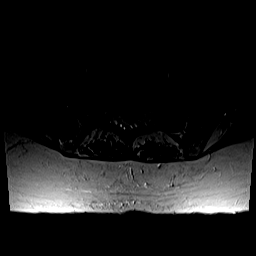
[im 6/38]
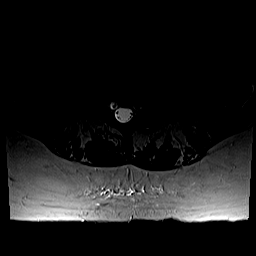
[im 11/38]
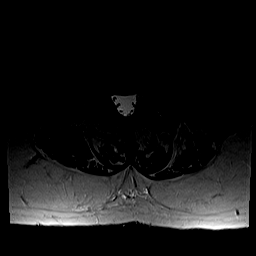
[im 16/38]
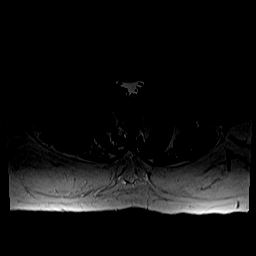
[im 19/38]
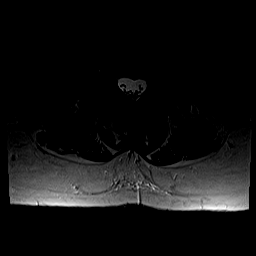
[im 22/38]
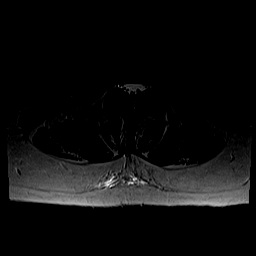
[im 27/38]
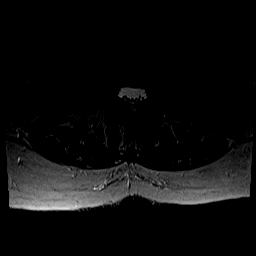
[im 32/38]
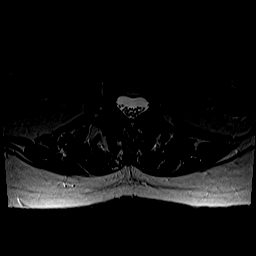
[im 38/38]
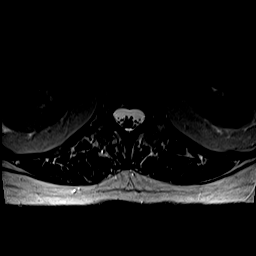

[Series 6: T1 · axial · 4.0mm · 0.39mm/px · z∈[-195,-15]mm · 5 of 38 slices shown (2 of 2)]
[im 1/38]
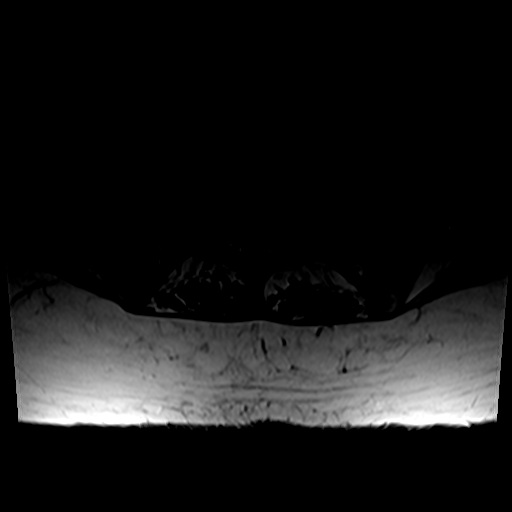
[im 6/38]
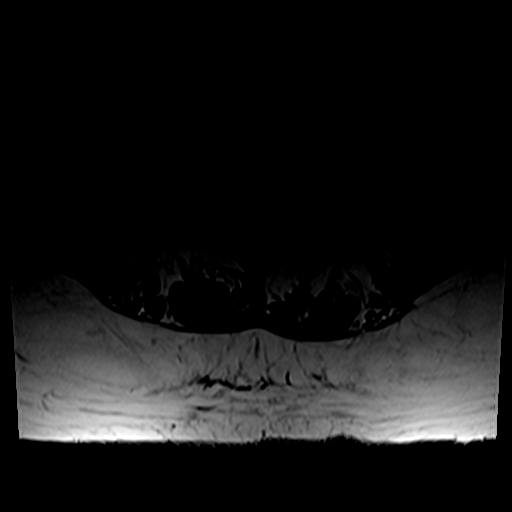
[im 11/38]
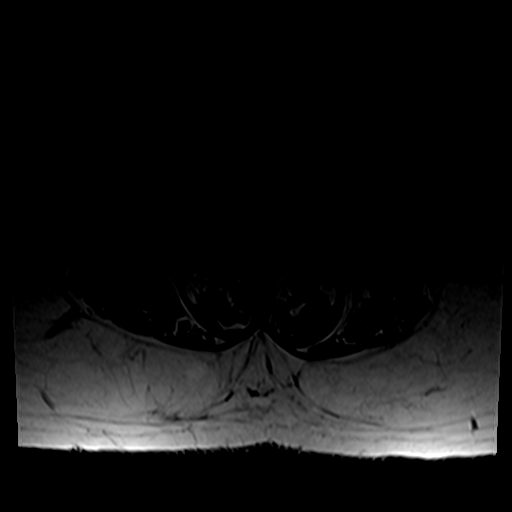
[im 19/38]
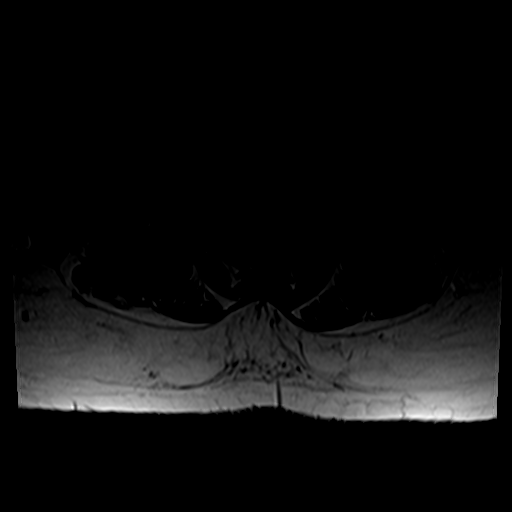
[im 32/38]
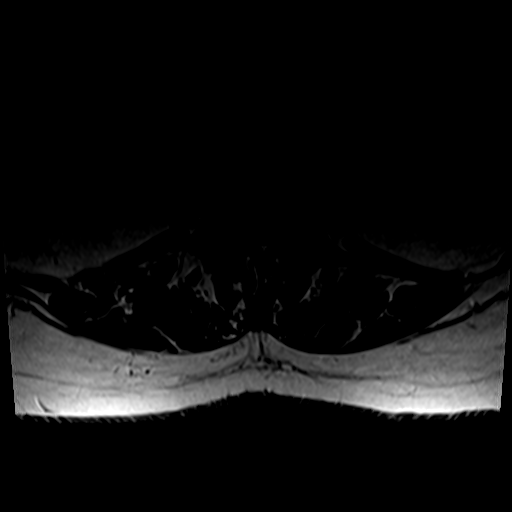

[26 of 48 positions shown; findings below may reference images not displayed]

FINDINGS: Segmentation: 5 lumbar type vertebral bodies. S1 is somewhat
transitional.

Alignment: Mild curvature convex to the left in the lumbar region. 2
mm anterolisthesis L4-5.

Vertebrae:  No fracture or primary bone lesion.

Conus medullaris and cauda equina: Conus extends to the L1 level.
Conus and cauda equina appear normal.

Paraspinal and other soft tissues: Negative

Disc levels:

No significant finding at T12-L1 or L1-2.

L2-3: Mild bulging of the disc. Mild facet hypertrophy. No
compressive stenosis.

L3-4: Mild bulging of the disc. Mild facet hypertrophy. No
compressive stenosis.

L4-5: Bilateral facet osteoarthritis with 2 mm of anterolisthesis.
Mild bulging of the disc. Mild narrowing of the lateral recesses but
without visible neural compression.

L5-S1: Mild bulging of the disc. Facet osteoarthritis on the left.
No central canal stenosis. Mild foraminal encroachment on the left
that could possibly affect the left L5 nerve.
IMPRESSION: S1 is a transitional vertebra.

L2-3 and L3-4: Mild disc bulges and facet hypertrophy.  No stenosis.

L4-5: Facet osteoarthritis with 2 mm of anterolisthesis. Mild
bulging of the disc. No compressive stenosis.

L5-S1: Bulging of the disc. Facet osteoarthritis worse on the left.
Left foraminal encroachment could possibly affect the left L5 nerve,
though gross compression is not demonstrated. The facet arthropathy
could certainly be a cause of low back pain or referred facet
syndrome pain.

## 2020-06-24 MED ORDER — SODIUM CHLORIDE 0.9% FLUSH
10.0000 mL | Freq: Once | INTRAVENOUS | Status: AC
  Administered 2020-06-24: 10 mL via INTRAVENOUS
  Filled 2020-06-24: qty 10

## 2020-06-24 MED ORDER — HEPARIN SOD (PORK) LOCK FLUSH 100 UNIT/ML IV SOLN
500.0000 [IU] | Freq: Once | INTRAVENOUS | Status: AC
  Administered 2020-06-24: 500 [IU] via INTRAVENOUS
  Filled 2020-06-24: qty 5

## 2020-06-24 NOTE — Addendum Note (Signed)
Addended by: Lucile Crater on: 06/24/2020 11:08 AM   Modules accepted: Orders

## 2020-06-24 NOTE — Telephone Encounter (Signed)
Appointments scheduled calendar printed per 10/29 los 

## 2020-06-24 NOTE — Progress Notes (Signed)
Hematology and Oncology Follow Up Visit  Christina [name] [id number] Jun 02, 1941 79 y.o. 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  She looks quite good.  We did go ahead and do a CT scan on her.  This was a follow-up to make sure that there is no problems with respect to her bladder cancer recurrent.  The CT scan was done on October 14.  Thankfully, there is no evidence of recurrent disease.  However, she is clearly at significant risk for recurrence given that she had 12+ lymph nodes.  Her urostomy is working quite nicely.  She has had no fever.  There has been no cough.  She has had no nausea or vomiting.  She has had no leg swelling.  There has been no rashes.  Currently, her performance status is ECOG 1.   Medications:  Current Outpatient Medications:  .  DPH-Lido-AlHydr-MgHydr-Simeth (FIRST-MOUTHWASH BLM) SUSP, Swish and spit 5 mLs every 6 (six) hours as needed, Disp: , Rfl:  .  famotidine (PEPCID) 20 MG tablet, Take by mouth., Disp: , Rfl:  .  lactulose (CHRONULAC) 10 GM/15ML solution, Take by mouth., Disp: , Rfl:  .  LORazepam (ATIVAN) 0.5 MG tablet, Take 1 tablet (0.5 mg total) by mouth 2 (two) times daily as needed for anxiety., Disp: 60 tablet, Rfl: 5 .  magnesium oxide (MAG-OX) 400 MG tablet, Take by mouth., Disp: , Rfl:  .  PARoxetine (PAXIL) 20 MG tablet, Take 1 tablet (20 mg total) by mouth daily., Disp: 90 tablet, Rfl: 1 .  potassium chloride SA (KLOR-CON) 20 MEQ tablet, Take 2 tablets (40 mEq total) by mouth daily., Disp: 14 tablet, Rfl: 0  Allergies:  Allergies  Allergen Reactions  . Nutrasweet Aspartame [Aspartame] Diarrhea  . Lisinopril Cough    Past Medical History, Surgical history, Social history, and Family History were reviewed and  updated.  Review of Systems: Review of Systems  Constitutional: Negative.   HENT:  Negative.   Eyes: Negative.   Respiratory: Negative.   Cardiovascular: Negative.   Gastrointestinal: Negative.   Endocrine: Negative.   Genitourinary: Negative.    Musculoskeletal: Negative.   Skin: Negative.   Neurological: Negative.   Hematological: Negative.   Psychiatric/Behavioral: Negative.     Physical Exam:  height is 5\' 5"  (1.651 m) and weight is 144 lb (65.3 kg). Her oral temperature is 97.8 F (36.6 C). Her blood pressure is 126/65 and her pulse is 81. Her respiration is 18 and oxygen saturation is 97%.   Wt Readings from Last 3 Encounters:  06/24/20 144 lb (65.3 kg)  05/30/20 144 lb (65.3 kg)  05/26/20 143 lb (64.9 kg)    Physical Exam Vitals reviewed.  HENT:     Head: Normocephalic and atraumatic.  Eyes:     Pupils: Pupils are equal, round, and reactive to light.  Cardiovascular:     Rate and Rhythm: Normal rate and regular rhythm.     Heart sounds: Normal heart sounds.  Pulmonary:     Effort: Pulmonary effort is normal.     Breath sounds: Normal breath sounds.  Abdominal:     General: Bowel sounds are normal.     Palpations: Abdomen is soft.     Comments: Abdominal exam shows the healed laparotomy scar  in the midline.  She has some firmness in the middle of the laparotomy scar.  She has the urostomy bag which is well-positioned.  Urine is clear.  There is no fluid wave in the abdomen.  Bowel sounds are present.  She has no palpable liver or spleen tip.  Musculoskeletal:        General: No tenderness or deformity. Normal range of motion.     Cervical back: Normal range of motion.  Lymphadenopathy:     Cervical: No cervical adenopathy.  Skin:    General: Skin is warm and dry.     Findings: No erythema or rash.  Neurological:     Mental Status: She is alert and oriented to person, place, and time.  Psychiatric:        Behavior: Behavior normal.        Thought Content:  Thought content normal.        Judgment: Judgment normal.     Lab Results  Component Value Date   WBC 5.3 05/26/2020   HGB 12.0 05/26/2020   HCT 36.5 05/26/2020   MCV 103.1 (H) 05/26/2020   PLT 178 05/26/2020     Chemistry      Component Value Date/Time   NA 141 05/26/2020 0955   NA 142 03/04/2017 1016   K 4.2 05/26/2020 0955   CL 107 05/26/2020 0955   CO2 28 05/26/2020 0955   BUN 18 05/26/2020 0955   BUN 13 03/04/2017 1016   CREATININE 0.94 05/26/2020 0955   CREATININE 0.78 05/20/2015 1704      Component Value Date/Time   CALCIUM 10.5 (H) 05/26/2020 0955   ALKPHOS 61 05/26/2020 0955   AST 16 05/26/2020 0955   ALT 9 05/26/2020 0955   BILITOT 0.6 05/26/2020 0955      Impression and Plan: Christina [name] is a 79 year old white female.  She had superficial bladder cancer.  She was treated with intravesicular therapy.  However, she has had had muscle invasive cancer which was poorly differentiated.  She then underwent neoadjuvant chemotherapy followed by radical cystectomy.  She still is found to have stage IIIB disease.  She definitely has a high risk of recurrence.  She needs to be followed with CAT scans every 3 months.  She will see her doctor at [facility] in January.  They will do a CT scan at that time.  She really would like to keep her follow-up with [place].  We are [place] 10 minutes where she lives.  He would just be somewhat easier for her that we could do her surveillance and do scans on her here.  She will talk to her oncologist at [facility] about this.  I would like to see her back in February.  This will be after she sees her oncologist at [facility].  Again, CT scan will be done.  If she has recurrent disease, I feel that we could treat her here.  I would like to think that we would be able to utilize targeted therapy or maybe immunotherapy.    Volanda Napoleon, MD 10/29/202110:35 AM

## 2020-06-24 NOTE — Progress Notes (Signed)
Oncology Nurse Navigator Documentation  Oncology Nurse Navigator Flowsheets 06/24/2020  Abnormal Finding Date -  Confirmed Diagnosis Date -  Diagnosis Status -  Navigator Follow Up Date: -  Navigator Follow Up Reason: -  Navigation Complete Date: 06/24/2020  Post Navigation: Continue to Follow Patient? No  Reason Not Navigating Patient: No Treatment, Observation Only  Navigator Location [facility]  Referral Date to RadOnc/MedOnc -  Navigator Encounter Type Treatment;Appt/Treatment Plan Review  Telephone -  Patient Visit Type MedOnc  Treatment Phase Post-Tx Follow-up  Barriers/Navigation Needs No Barriers At This Time  Interventions Psycho-Social Support  Acuity Level 1-No Barriers  Coordination of Care -  Support Groups/Services Friends and Family  Time Spent with Patient 15

## 2020-06-24 NOTE — Progress Notes (Signed)
Pt discharged in no apparent distress. Pt left ambulatory without assistance. Pt aware of discharge instructions and verbalized understanding and had no further questions.  

## 2020-06-24 NOTE — Patient Instructions (Signed)

## 2020-07-07 DIAGNOSIS — M546 Pain in thoracic spine: Secondary | ICD-10-CM | POA: Diagnosis not present

## 2020-07-07 DIAGNOSIS — M5416 Radiculopathy, lumbar region: Secondary | ICD-10-CM | POA: Diagnosis not present

## 2020-07-12 DIAGNOSIS — K9409 Other complications of colostomy: Secondary | ICD-10-CM | POA: Diagnosis not present

## 2020-07-12 DIAGNOSIS — Z936 Other artificial openings of urinary tract status: Secondary | ICD-10-CM | POA: Diagnosis not present

## 2020-07-19 DIAGNOSIS — K9409 Other complications of colostomy: Secondary | ICD-10-CM | POA: Diagnosis not present

## 2020-07-19 DIAGNOSIS — Z936 Other artificial openings of urinary tract status: Secondary | ICD-10-CM | POA: Diagnosis not present

## 2020-08-01 DIAGNOSIS — C672 Malignant neoplasm of lateral wall of bladder: Secondary | ICD-10-CM | POA: Diagnosis not present

## 2020-08-11 DIAGNOSIS — M5416 Radiculopathy, lumbar region: Secondary | ICD-10-CM | POA: Diagnosis not present

## 2020-09-07 DIAGNOSIS — Z936 Other artificial openings of urinary tract status: Secondary | ICD-10-CM | POA: Diagnosis not present

## 2020-09-07 DIAGNOSIS — K9409 Other complications of colostomy: Secondary | ICD-10-CM | POA: Diagnosis not present

## 2020-09-10 DIAGNOSIS — K9409 Other complications of colostomy: Secondary | ICD-10-CM | POA: Diagnosis not present

## 2020-09-10 DIAGNOSIS — Z936 Other artificial openings of urinary tract status: Secondary | ICD-10-CM | POA: Diagnosis not present

## 2020-09-19 IMAGING — CT NM PET TUM IMG INITIAL (PI) SKULL BASE T - THIGH
1 of 7 series · 1 of 25 positions shown · non-contrast
Comparison: CT abdomen pelvis 04/17/2019.

CLINICAL DATA: Initial treatment strategy for bladder cancer.

EXAM:
NUCLEAR MEDICINE PET SKULL BASE TO THIGH
TECHNIQUE: 8.6 mCi F-18 FDG was injected intravenously. Full-ring PET imaging
was performed from the skull base to thigh after the radiotracer. CT
data was obtained and used for attenuation correction and anatomic
localization.
Fasting blood glucose: 98 mg/dl

[Series 4: ct sk_thigh 5.0 b31f · axial · 5.0mm · 0.98mm/px · 1 of 234 slices shown]
[im 234/234  brain]
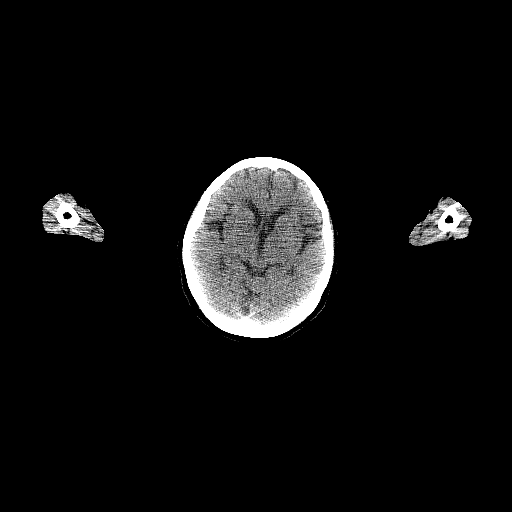

[1 of 25 positions shown; findings below may reference images not displayed]

FINDINGS: Mediastinal blood pool activity: SUV max

Liver activity: SUV max NA

NECK: No abnormal hypermetabolism.

Incidental CT findings: None.

CHEST: No hypermetabolic mediastinal, hilar or axillary lymph nodes.
No hypermetabolic pulmonary nodules.

Incidental CT findings: Atherosclerotic calcification of the aorta.
Heart size within normal limits. No pericardial or pleural effusion.
Mild centrilobular emphysema.

ABDOMEN/PELVIS: No abnormal hypermetabolism in the liver, adrenal
glands, spleen or pancreas. No hypermetabolic lymph nodes.

Incidental CT findings: Liver and gallbladder are unremarkable.
Fluid density nodule in the body of the right adrenal gland measures
1.8 cm. Left adrenal gland, kidneys, spleen, pancreas, stomach and
bowel are unremarkable. Bladder wall thickening with bilateral
diverticula. Atherosclerotic calcification of the aorta without
aneurysm. Umbilical hernia contains fat. No free fluid.

SKELETON: No abnormal osseous hypermetabolism.

Incidental CT findings: Degenerative changes in the spine.
IMPRESSION: 1. No evidence of metastatic disease. No abnormal hypermetabolism in
the neck, chest, abdomen or pelvis.
2. Right adrenal adenoma.
3.  Aortic atherosclerosis (STJ7M-170.0).
4.  Emphysema (STJ7M-MRO.E).

## 2020-09-20 DIAGNOSIS — M5416 Radiculopathy, lumbar region: Secondary | ICD-10-CM | POA: Diagnosis not present

## 2020-09-26 DIAGNOSIS — Z8551 Personal history of malignant neoplasm of bladder: Secondary | ICD-10-CM | POA: Diagnosis not present

## 2020-09-26 DIAGNOSIS — Z86018 Personal history of other benign neoplasm: Secondary | ICD-10-CM | POA: Diagnosis not present

## 2020-09-26 DIAGNOSIS — C678 Malignant neoplasm of overlapping sites of bladder: Secondary | ICD-10-CM | POA: Diagnosis not present

## 2020-09-26 DIAGNOSIS — M1990 Unspecified osteoarthritis, unspecified site: Secondary | ICD-10-CM | POA: Diagnosis not present

## 2020-09-26 DIAGNOSIS — K219 Gastro-esophageal reflux disease without esophagitis: Secondary | ICD-10-CM | POA: Diagnosis not present

## 2020-09-26 DIAGNOSIS — I10 Essential (primary) hypertension: Secondary | ICD-10-CM | POA: Diagnosis not present

## 2020-09-26 DIAGNOSIS — N6310 Unspecified lump in the right breast, unspecified quadrant: Secondary | ICD-10-CM | POA: Diagnosis not present

## 2020-09-26 DIAGNOSIS — Z08 Encounter for follow-up examination after completed treatment for malignant neoplasm: Secondary | ICD-10-CM | POA: Diagnosis not present

## 2020-09-26 DIAGNOSIS — R918 Other nonspecific abnormal finding of lung field: Secondary | ICD-10-CM | POA: Diagnosis not present

## 2020-09-26 DIAGNOSIS — R600 Localized edema: Secondary | ICD-10-CM | POA: Diagnosis not present

## 2020-10-05 DIAGNOSIS — M5416 Radiculopathy, lumbar region: Secondary | ICD-10-CM | POA: Diagnosis not present

## 2020-10-14 ENCOUNTER — Inpatient Hospital Stay: Payer: Medicare PPO | Admitting: Hematology & Oncology

## 2020-10-14 ENCOUNTER — Inpatient Hospital Stay: Payer: Medicare PPO

## 2020-10-26 ENCOUNTER — Inpatient Hospital Stay: Payer: Medicare PPO | Attending: Hematology & Oncology

## 2020-10-26 ENCOUNTER — Inpatient Hospital Stay: Payer: Medicare PPO

## 2020-10-26 ENCOUNTER — Other Ambulatory Visit: Payer: Self-pay

## 2020-10-26 ENCOUNTER — Encounter: Payer: Self-pay | Admitting: Hematology & Oncology

## 2020-10-26 ENCOUNTER — Inpatient Hospital Stay (HOSPITAL_BASED_OUTPATIENT_CLINIC_OR_DEPARTMENT_OTHER): Payer: Medicare PPO | Admitting: Hematology & Oncology

## 2020-10-26 VITALS — BP 139/66 | HR 74 | Temp 97.8°F | Resp 18 | Ht 65.0 in | Wt 150.0 lb

## 2020-10-26 VITALS — BP 139/66 | HR 74 | Temp 97.8°F | Resp 18

## 2020-10-26 DIAGNOSIS — Z79899 Other long term (current) drug therapy: Secondary | ICD-10-CM | POA: Diagnosis not present

## 2020-10-26 DIAGNOSIS — C679 Malignant neoplasm of bladder, unspecified: Secondary | ICD-10-CM | POA: Diagnosis not present

## 2020-10-26 DIAGNOSIS — C670 Malignant neoplasm of trigone of bladder: Secondary | ICD-10-CM

## 2020-10-26 DIAGNOSIS — Z9221 Personal history of antineoplastic chemotherapy: Secondary | ICD-10-CM | POA: Diagnosis not present

## 2020-10-26 DIAGNOSIS — C671 Malignant neoplasm of dome of bladder: Secondary | ICD-10-CM

## 2020-10-26 LAB — CMP (CANCER CENTER ONLY)
ALT: 6 U/L (ref 0–44)
AST: 13 U/L — ABNORMAL LOW (ref 15–41)
Albumin: 3.9 g/dL (ref 3.5–5.0)
Alkaline Phosphatase: 65 U/L (ref 38–126)
Anion gap: 5 (ref 5–15)
BUN: 20 mg/dL (ref 8–23)
CO2: 27 mmol/L (ref 22–32)
Calcium: 10.5 mg/dL — ABNORMAL HIGH (ref 8.9–10.3)
Chloride: 105 mmol/L (ref 98–111)
Creatinine: 1 mg/dL (ref 0.44–1.00)
GFR, Estimated: 58 mL/min — ABNORMAL LOW (ref >60)
Glucose, Bld: 101 mg/dL — ABNORMAL HIGH (ref 70–99)
Potassium: 4.5 mmol/L (ref 3.5–5.1)
Sodium: 137 mmol/L (ref 135–145)
Total Bilirubin: 0.7 mg/dL (ref 0.3–1.2)
Total Protein: 6.2 g/dL — ABNORMAL LOW (ref 6.5–8.1)

## 2020-10-26 LAB — CBC WITH DIFFERENTIAL (CANCER CENTER ONLY)
Abs Immature Granulocytes: 0.01 10*3/uL (ref 0.00–0.07)
Basophils Absolute: 0 10*3/uL (ref 0.0–0.1)
Basophils Relative: 1 %
Eosinophils Absolute: 0 10*3/uL (ref 0.0–0.5)
Eosinophils Relative: 1 %
HCT: 37.4 % (ref 36.0–46.0)
Hemoglobin: 12.1 g/dL (ref 12.0–15.0)
Immature Granulocytes: 0 %
Lymphocytes Relative: 32 %
Lymphs Abs: 2 10*3/uL (ref 0.7–4.0)
MCH: 32.4 pg (ref 26.0–34.0)
MCHC: 32.4 g/dL (ref 30.0–36.0)
MCV: 100 fL (ref 80.0–100.0)
Monocytes Absolute: 0.6 10*3/uL (ref 0.1–1.0)
Monocytes Relative: 10 %
Neutro Abs: 3.7 10*3/uL (ref 1.7–7.7)
Neutrophils Relative %: 56 %
Platelet Count: 167 10*3/uL (ref 150–400)
RBC: 3.74 MIL/uL — ABNORMAL LOW (ref 3.87–5.11)
RDW: 13 % (ref 11.5–15.5)
WBC Count: 6.4 10*3/uL (ref 4.0–10.5)
nRBC: 0 % (ref 0.0–0.2)

## 2020-10-26 LAB — LACTATE DEHYDROGENASE: LDH: 136 U/L (ref 98–192)

## 2020-10-26 MED ORDER — HEPARIN SOD (PORK) LOCK FLUSH 100 UNIT/ML IV SOLN
500.0000 [IU] | Freq: Once | INTRAVENOUS | Status: AC
  Administered 2020-10-26: 500 [IU] via INTRAVENOUS
  Filled 2020-10-26: qty 5

## 2020-10-26 MED ORDER — SODIUM CHLORIDE 0.9% FLUSH
10.0000 mL | INTRAVENOUS | Status: DC | PRN
  Administered 2020-10-26: 10 mL via INTRAVENOUS
  Filled 2020-10-26: qty 10

## 2020-10-26 NOTE — Patient Instructions (Signed)

## 2020-10-26 NOTE — Addendum Note (Signed)
Addended by: Randolm Idol on: 10/26/2020 12:58 PM   Modules accepted: Orders, SmartSet

## 2020-10-26 NOTE — Progress Notes (Signed)
Hematology and Oncology Follow Up Visit  Christina [name] [id number] [birth year] 80 y.o. 10/26/2020   Principle Diagnosis:   Stage IIIB (T1N3M0) invasive urothelial carcinoma of the bladder   Current Therapy:    Status post neoadjuvant chemotherapy with ddMVAC  Radical cystectomy on 03/03/2020      Interim History:  Christina [name] is back for follow-up.  She is doing quite well.  She really has no specific complaints.  She is happy that she is getting all of her care locally.  She was last seen at [facility] in January.  She had a CT scan done at that time.  There was nothing on the CT scan that look like recurrent disease.  Her oncologist at [facility] wants her to have a blood test to look for circulating tumor cells.  This might be part of a clinical trial that they are looking at.  She has had no problems with her urostomy.  There is been no problems with nausea or vomiting.  She has had no cough or shortness of breath.  There has been no bleeding.  She has had no fever.  There has been no leg swelling.  She would like to have her Port-A-Cath taken out.  We will see about getting this taken out.  Hopefully, radiology will do this for his locally.  Overall, I would say her performance status is probably ECOG 1.     Medications:  Current Outpatient Medications:  .  famotidine (PEPCID) 20 MG tablet, Take by mouth., Disp: , Rfl:  .  LORazepam (ATIVAN) 0.5 MG tablet, Take 1 tablet (0.5 mg total) by mouth 2 (two) times daily as needed for anxiety., Disp: 60 tablet, Rfl: 5 .  magnesium oxide (MAG-OX) 400 MG tablet, Take by mouth., Disp: , Rfl:  .  PARoxetine (PAXIL) 20 MG tablet, Take 1 tablet (20 mg total) by mouth daily., Disp: 90 tablet, Rfl: 1 .  potassium chloride SA (KLOR-CON) 20 MEQ tablet, Take 2 tablets (40 mEq total) by mouth daily., Disp: 14 tablet, Rfl: 0 .  DPH-Lido-AlHydr-MgHydr-Simeth (FIRST-MOUTHWASH BLM) SUSP, Swish and spit 5 mLs every 6 (six) hours as needed (Patient not  taking: Reported on 10/26/2020), Disp: , Rfl:  .  lactulose (CHRONULAC) 10 GM/15ML solution, Take by mouth. (Patient not taking: Reported on 10/26/2020), Disp: , Rfl:  No current facility-administered medications for this visit.  Facility-Administered Medications Ordered in Other Visits:  .  sodium chloride flush (NS) 0.9 % injection 10 mL, 10 mL, Intravenous, PRN, Volanda Napoleon, MD, 10 mL at 10/26/20 1255  Allergies:  Allergies  Allergen Reactions  . Nutrasweet Aspartame [Aspartame] Diarrhea  . Lisinopril Cough    Past Medical History, Surgical history, Social history, and Family History were reviewed and updated.  Review of Systems: Review of Systems  Constitutional: Negative.   HENT:  Negative.   Eyes: Negative.   Respiratory: Negative.   Cardiovascular: Negative.   Gastrointestinal: Negative.   Endocrine: Negative.   Genitourinary: Negative.    Musculoskeletal: Negative.   Skin: Negative.   Neurological: Negative.   Hematological: Negative.   Psychiatric/Behavioral: Negative.     Physical Exam:  height is 5\' 5"  (1.651 m) and weight is 150 lb (68 kg). Her oral temperature is 97.8 F (36.6 C). Her blood pressure is 139/66 and her pulse is 74. Her respiration is 18 and oxygen saturation is 97%.   Wt Readings from Last 3 Encounters:  10/26/20 150 lb (68 kg)  06/24/20 144 lb (65.3 kg)  05/30/20 144 lb (65.3 kg)    Physical Exam Vitals reviewed.  HENT:     Head: Normocephalic and atraumatic.  Eyes:     Pupils: Pupils are equal, round, and reactive to light.  Cardiovascular:     Rate and Rhythm: Normal rate and regular rhythm.     Heart sounds: Normal heart sounds.  Pulmonary:     Effort: Pulmonary effort is normal.     Breath sounds: Normal breath sounds.  Abdominal:     General: Bowel sounds are normal.     Palpations: Abdomen is soft.     Comments: Abdominal exam shows the healed laparotomy scar in the midline.  She has some firmness in the middle of the  laparotomy scar.  She has the urostomy bag which is well-positioned.  Urine is clear.  There is no fluid wave in the abdomen.  Bowel sounds are present.  She has no palpable liver or spleen tip.  Musculoskeletal:        General: No tenderness or deformity. Normal range of motion.     Cervical back: Normal range of motion.  Lymphadenopathy:     Cervical: No cervical adenopathy.  Skin:    General: Skin is warm and dry.     Findings: No erythema or rash.  Neurological:     Mental Status: She is alert and oriented to person, place, and time.  Psychiatric:        Behavior: Behavior normal.        Thought Content: Thought content normal.        Judgment: Judgment normal.     Lab Results  Component Value Date   WBC 6.4 10/26/2020   HGB 12.1 10/26/2020   HCT 37.4 10/26/2020   MCV 100.0 10/26/2020   PLT 167 10/26/2020     Chemistry      Component Value Date/Time   NA 137 10/26/2020 1215   NA 142 03/04/2017 1016   K 4.5 10/26/2020 1215   CL 105 10/26/2020 1215   CO2 27 10/26/2020 1215   BUN 20 10/26/2020 1215   BUN 13 03/04/2017 1016   CREATININE 1.00 10/26/2020 1215   CREATININE 0.78 05/20/2015 1704      Component Value Date/Time   CALCIUM 10.5 (H) 10/26/2020 1215   ALKPHOS 65 10/26/2020 1215   AST 13 (L) 10/26/2020 1215   ALT 6 10/26/2020 1215   BILITOT 0.7 10/26/2020 1215      Impression and Plan: Christina [name] is a 80 year old white female.  She had superficial bladder cancer.  She was treated with intravesicular therapy.  However, she has had had muscle invasive cancer which was poorly differentiated.  She then underwent neoadjuvant chemotherapy followed by radical cystectomy.  She still is found to have stage IIIB disease.  She definitely has a high risk of recurrence.  She needs to be followed with CAT scans every 3 months.  We will set her skin up in April.  We will be interesting to see what this circulating tumor cell study shows.  I know this is being done for  other malignancies.  I know this can be prognostic, particularly for breast cancer and colon cancer.  We will get her back in April.  We will do the CT scan the same day that we see her.    Volanda Napoleon, MD 3/2/20221:58 PM

## 2020-11-03 DIAGNOSIS — Z936 Other artificial openings of urinary tract status: Secondary | ICD-10-CM | POA: Diagnosis not present

## 2020-11-03 DIAGNOSIS — K9409 Other complications of colostomy: Secondary | ICD-10-CM | POA: Diagnosis not present

## 2020-11-03 DIAGNOSIS — C7911 Secondary malignant neoplasm of bladder: Secondary | ICD-10-CM | POA: Diagnosis not present

## 2020-11-08 ENCOUNTER — Other Ambulatory Visit: Payer: Self-pay | Admitting: Student

## 2020-11-09 ENCOUNTER — Ambulatory Visit (HOSPITAL_COMMUNITY)
Admission: RE | Admit: 2020-11-09 | Discharge: 2020-11-09 | Disposition: A | Discharge status: Home / Self Care | Payer: Medicare PPO | Source: Ambulatory Visit | Attending: Hematology & Oncology | Admitting: Hematology & Oncology

## 2020-11-09 ENCOUNTER — Encounter (HOSPITAL_COMMUNITY): Payer: Self-pay

## 2020-11-09 ENCOUNTER — Other Ambulatory Visit: Payer: Self-pay

## 2020-11-09 DIAGNOSIS — Z79899 Other long term (current) drug therapy: Secondary | ICD-10-CM | POA: Insufficient documentation

## 2020-11-09 DIAGNOSIS — Z8551 Personal history of malignant neoplasm of bladder: Secondary | ICD-10-CM | POA: Diagnosis not present

## 2020-11-09 DIAGNOSIS — Z452 Encounter for adjustment and management of vascular access device: Secondary | ICD-10-CM | POA: Insufficient documentation

## 2020-11-09 DIAGNOSIS — C670 Malignant neoplasm of trigone of bladder: Secondary | ICD-10-CM | POA: Insufficient documentation

## 2020-11-09 DIAGNOSIS — Z87891 Personal history of nicotine dependence: Secondary | ICD-10-CM | POA: Diagnosis not present

## 2020-11-09 HISTORY — PX: IR REMOVAL TUN ACCESS W/ PORT W/O FL MOD SED: IMG2290

## 2020-11-09 MED ORDER — FENTANYL CITRATE (PF) 100 MCG/2ML IJ SOLN
INTRAMUSCULAR | Status: AC | PRN
  Administered 2020-11-09: 50 ug via INTRAVENOUS
  Administered 2020-11-09 (×2): 25 ug via INTRAVENOUS

## 2020-11-09 MED ORDER — MIDAZOLAM HCL 2 MG/2ML IJ SOLN
INTRAMUSCULAR | Status: AC
  Filled 2020-11-09: qty 2

## 2020-11-09 MED ORDER — MIDAZOLAM HCL 2 MG/2ML IJ SOLN
INTRAMUSCULAR | Status: AC | PRN
  Administered 2020-11-09: 1 mg via INTRAVENOUS
  Administered 2020-11-09 (×2): 0.5 mg via INTRAVENOUS

## 2020-11-09 MED ORDER — LIDOCAINE-EPINEPHRINE 1 %-1:100000 IJ SOLN
INTRAMUSCULAR | Status: AC
  Filled 2020-11-09: qty 1

## 2020-11-09 MED ORDER — FENTANYL CITRATE (PF) 100 MCG/2ML IJ SOLN
INTRAMUSCULAR | Status: AC
  Filled 2020-11-09: qty 2

## 2020-11-09 MED ORDER — SODIUM CHLORIDE 0.9 % IV SOLN: INTRAVENOUS | Status: DC

## 2020-11-09 MED ORDER — LIDOCAINE-EPINEPHRINE 1 %-1:100000 IJ SOLN
INTRAMUSCULAR | Status: AC | PRN
  Administered 2020-11-09: 10 mL via INTRADERMAL

## 2020-11-09 NOTE — Discharge Instructions (Signed)
Please call Interventional Radiology clinic [phone] with any questions or concerns.  You may remove your dressing and shower tomorrow.   Implanted Port Removal, Care After This sheet gives you information about how to care for yourself after your procedure. Your health care provider may also give you more specific instructions. If you have problems or questions, contact your health care provider. What can I expect after the procedure? After the procedure, it is common to have: Soreness or pain near your incision. Some swelling or bruising near your incision. Follow these instructions at home: Medicines Take over-the-counter and prescription medicines only as told by your health care provider. If you were prescribed an antibiotic medicine, take it as told by your health care provider. Do not stop taking the antibiotic even if you start to feel better. Bathing Do not take baths, swim, or use a hot tub until your health care provider approves. Ask your health care provider if you can take showers. You may only be allowed to take sponge baths. Incision care Follow instructions from your health care provider about how to take care of your incision. Make sure you: Wash your hands with soap and water before you change your bandage (dressing). If soap and water are not available, use hand sanitizer. Change your dressing as told by your health care provider. Keep your dressing dry. Leave stitches (sutures), skin glue, or adhesive strips in place. These skin closures may need to stay in place for 2 weeks or longer. If adhesive strip edges start to loosen and curl up, you may trim the loose edges. Do not remove adhesive strips completely unless your health care provider tells you to do that. Check your incision area every day for signs of infection. Check for: More redness, swelling, or pain. More fluid or blood. Warmth. Pus or a bad smell.    Driving Do not drive for 24 hours if you were  given a medicine to help you relax (sedative) during your procedure. If you did not receive a sedative, ask your health care provider when it is safe to drive.    Activity Return to your normal activities as told by your health care provider. Ask your health care provider what activities are safe for you. Do not lift anything that is heavier than 10 lb (4.5 kg), or the limit that you are told, until your health care provider says that it is safe. Do not do activities that involve lifting your arms over your head. General instructions Do not use any products that contain nicotine or tobacco, such as cigarettes and e-cigarettes. These can delay healing. If you need help quitting, ask your health care provider. Keep all follow-up visits as told by your health care provider. This is important. Contact a health care provider if: You have more redness, swelling, or pain around your incision. You have more fluid or blood coming from your incision. Your incision feels warm to the touch. You have pus or a bad smell coming from your incision. You have pain that is not relieved by your pain medicine. Get help right away if you have: A fever or chills. Chest pain. Difficulty breathing. Summary After the procedure, it is common to have pain, soreness, swelling, or bruising near your incision. If you were prescribed an antibiotic medicine, take it as told by your health care provider. Do not stop taking the antibiotic even if you start to feel better. Do not drive for 24 hours if you were given a sedative during   your procedure. Return to your normal activities as told by your health care provider. Ask your health care provider what activities are safe for you. This information is not intended to replace advice given to you by your health care provider. Make sure you discuss any questions you have with your health care provider. Document Revised: 09/26/2017 Document Reviewed: 09/26/2017 Elsevier Patient  Education  2021 Elsevier Inc.   Moderate Conscious Sedation, Adult, Care After This sheet gives you information about how to care for yourself after your procedure. Your health care provider may also give you more specific instructions. If you have problems or questions, contact your health care provider. What can I expect after the procedure? After the procedure, it is common to have: Sleepiness for several hours. Impaired judgment for several hours. Difficulty with balance. Vomiting if you eat too soon. Follow these instructions at home: For the time period you were told by your health care provider: Rest. Do not participate in activities where you could fall or become injured. Do not drive or use machinery. Do not drink alcohol. Do not take sleeping pills or medicines that cause drowsiness. Do not make important decisions or sign legal documents. Do not take care of children on your own.        Eating and drinking Follow the diet recommended by your health care provider. Drink enough fluid to keep your urine pale yellow. If you vomit: Drink water, juice, or soup when you can drink without vomiting. Make sure you have little or no nausea before eating solid foods.    General instructions Take over-the-counter and prescription medicines only as told by your health care provider. Have a responsible adult stay with you for the time you are told. It is important to have someone help care for you until you are awake and alert. Do not smoke. Keep all follow-up visits as told by your health care provider. This is important. Contact a health care provider if: You are still sleepy or having trouble with balance after 24 hours. You feel light-headed. You keep feeling nauseous or you keep vomiting. You develop a rash. You have a fever. You have redness or swelling around the IV site. Get help right away if: You have trouble breathing. You have new-onset confusion at  home. Summary After the procedure, it is common to feel sleepy, have impaired judgment, or feel nauseous if you eat too soon. Rest after you get home. Know the things you should not do after the procedure. Follow the diet recommended by your health care provider and drink enough fluid to keep your urine pale yellow. Get help right away if you have trouble breathing or new-onset confusion at home. This information is not intended to replace advice given to you by your health care provider. Make sure you discuss any questions you have with your health care provider. Document Revised: 12/11/2019 Document Reviewed: 07/09/2019 Elsevier Patient Education  2021 Elsevier Inc.   

## 2020-11-09 NOTE — Procedures (Signed)
Pre Procedural Dx: Poor venous access Post Procedural Dx: Same  Successful removal of anterior chest wall port-a-cath.  EBL: Trace No immediate post procedural complications.   Jay Urbano Milhouse, MD Pager #: [phone]  

## 2020-11-09 NOTE — H&P (Signed)
Chief Complaint: Patient was seen in consultation today for bladder cancer  Referring Physician(s): Ennever,Peter R  Supervising Physician: Sandi Mariscal  Patient Status: [facility] - Out-pt  History of Present Illness: Christina [name] is a 80 y.o. female with past medical history of arthritis, asthma, HTN, diverticulosis, and stage IIIB bladder cancer s/p chemotherapy via Port-A-Cath placed at IR at [facility] 12/16/19 and surgical resection.  She has achieved disease stability without signs of recurrence since completion of therapy in August 2021.  She desires to have her Port-A-Cath removed.   Christina [name] presents to [facility] Radiology today in her usual state of health.  She understands that she has a high risk of recurrence. She has no desire to resume chemotherapy at this time should disease recurrence occur and restates her desire to have Port-A-Cath removed. She has been NPO.   Past Medical History:  Diagnosis Date  . Arthritis   . Asthma    a little?, no problems in several years  . Bladder cancer ([place])   . Bladder tumor   . Cervical cancer screening 01/31/2012  . Chicken pox as a child  . Degenerative tear of acetabular labrum of left hip 02/26/2019  . Depression with anxiety 06/07/2009   Qualifier: Diagnosis of  By: [name] MD, [name]    . Dermatitis of external ear 07/11/2012  . Diverticulosis   . Ganglion cyst 02/26/2019   Right elbow  . Hiatal hernia with gastroesophageal reflux 10/26/2010   Qualifier: Diagnosis of  By: [name] MD, [name]    . Hyperglycemia 06/21/2013  . Hyperlipidemia, mixed 10/16/2015   pt unaware  . Hypertension   . Left hip pain 07/10/2015  . Lesion of breast 12/03/2016   benign; resolved  . Low back pain 05/29/2015  . Measles as a child  . Mumps as a child  . Osteopenia 01/03/2017  . Overweight (BMI 25.0-29.9) 10/07/2008   Qualifier: Diagnosis of  By: [name] MD, [name]    . Palpitations    several years ago, not currently  .  Pre-diabetes   . Psoriasis    ears  . RUQ pain 05/29/2015  . Spider veins    Bilateral legs  . Spinal stenosis   . Tailor's bunionette, left 09/02/2017  . Umbilical hernia   . Urinary frequency 09/28/2011  . Vertigo   . Wears dentures    Upper,    Past Surgical History:  Procedure Laterality Date  . ABDOMINAL SURGERY  1970's  . BREAST BIOPSY Right 1980   BREAST EXCISIONAL BIOPSY  . BREAST BIOPSY Left 1970   BREAST EXCISIONAL BIOPSY  . BREAST CYST ASPIRATION    . CATARACT EXTRACTION, BILATERAL    . COLONOSCOPY    . CYSTECTOMY     abdomen  . CYSTOSCOPY W/ RETROGRADES Bilateral 04/27/2019   Procedure: CYSTOSCOPY WITH RETROGRADE PYELOGRAM;  Surgeon: Cleon Gustin, MD;  Location: [facility];  Service: Urology;  Laterality: Bilateral;  . EYE SURGERY Bilateral    cataract  . INSERTION OF MESH N/A 06/11/2017   Procedure: INSERTION OF MESH;  Surgeon: Rolm Bookbinder, MD;  Location: [place];  Service: General;  Laterality: N/A;  BILATERAL TAP BLOCK  . TRANSURETHRAL RESECTION OF BLADDER TUMOR N/A 04/27/2019   Procedure: TRANSURETHRAL RESECTION OF BLADDER TUMOR (TURBT);  Surgeon: Cleon Gustin, MD;  Location: [facility];  Service: Urology;  Laterality: N/A;  1 HR  . TRANSURETHRAL RESECTION OF BLADDER TUMOR N/A 06/01/2019   Procedure: TRANSURETHRAL RESECTION OF BLADDER  TUMOR (TURBT);  Surgeon: Cleon Gustin, MD;  Location: [facility];  Service: Urology;  Laterality: N/A;  1 HR  . TRANSURETHRAL RESECTION OF BLADDER TUMOR N/A 10/22/2019   Procedure: TRANSURETHRAL RESECTION OF BLADDER TUMOR (TURBT);  Surgeon: Cleon Gustin, MD;  Location: [facility];  Service: Urology;  Laterality: N/A;  . TUBAL LIGATION  1970  . VENTRAL HERNIA REPAIR N/A 06/11/2017   Procedure: LAPAROSCOPIC VENTRAL HERNIA REPAIR WITH MESH ERAS PATHWAY;  Surgeon: Rolm Bookbinder, MD;  Location: [place];  Service: General;  Laterality: N/A;   BILATERAL TAP BLOCK    Allergies: Nutrasweet aspartame [aspartame] and Lisinopril  Medications: Prior to Admission medications   Medication Sig Start Date End Date Taking? Authorizing Provider  LORazepam (ATIVAN) 0.5 MG tablet Take 1 tablet (0.5 mg total) by mouth 2 (two) times daily as needed for anxiety. 05/30/20  Yes Kuneff, Renee A, DO  magnesium oxide (MAG-OX) 400 MG tablet Take by mouth. 02/10/20  Yes [provider]  PARoxetine (PAXIL) 20 MG tablet Take 1 tablet (20 mg total) by mouth daily. 05/30/20  Yes Kuneff, Renee A, DO  potassium chloride SA (KLOR-CON) 20 MEQ tablet Take 2 tablets (40 mEq total) by mouth daily. 01/13/20  Yes [place], Holli Humbles, NP  DPH-Lido-AlHydr-MgHydr-Simeth (FIRST-MOUTHWASH BLM) SUSP Swish and spit 5 mLs every 6 (six) hours as needed Patient not taking: Reported on 10/26/2020 01/13/20   [provider]  famotidine (PEPCID) 20 MG tablet Take by mouth. 02/04/20 02/03/21  [provider]  lactulose (CHRONULAC) 10 GM/15ML solution Take by mouth. Patient not taking: Reported on 10/26/2020 02/04/20   [provider]     Family History  Problem Relation Age of Onset  . Hypertension Mother   . Anxiety disorder Mother   . Ovarian cancer Mother 26       lung- smoker, ovarian  . Lung cancer Mother        smoker  . Arthritis Mother   . Hyperlipidemia Mother   . Alcohol abuse Father   . Diabetes Sister   . Hypertension Sister   . Ovarian cancer Sister   . Arthritis Sister   . Depression Sister   . Hyperlipidemia Sister   . Other Maternal Grandmother        pacemaker  . Multiple sclerosis Maternal Grandfather        ?  [name] Arthritis Brother   . COPD Brother   . Heart attack Brother   . Hyperlipidemia Brother   . Stroke Brother   . Asthma Sister   . Depression Sister   . Hyperlipidemia Sister   . Breast cancer Sister   . Asthma Sister   . Depression Sister   . Asthma Paternal Uncle     Social History   Socioeconomic  History  . Marital status: Widowed    Spouse name: Not on file  . Number of children: 2  . Years of education: Not on file  . Highest education level: Not on file  Occupational History  . Occupation: retired  Tobacco Use  . Smoking status: Former Smoker    Packs/day: 1.00    Years: 30.00    Pack years: 30.00    Types: Cigarettes    Quit date: 08/27/1990    Years since quitting: 30.2  . Smokeless tobacco: Never Used  Vaping Use  . Vaping Use: Never used  Substance and Sexual Activity  . Alcohol use: No  . Drug use: No  . Sexual  activity: Not Currently    Birth control/protection: Post-menopausal    Comment: lives alone, no dietary restrictions  Other Topics Concern  . Not on file  Social History Narrative   Retired. Lives alone.    Attended some business college.    Former smoker.    Smoke alarm in the home. Wears seat balt.    Wears dentures.    Feels safe in her relationships.       Social Determinants of Health   Financial Resource Strain: Not on file  Food Insecurity: Not on file  Transportation Needs: Not on file  Physical Activity: Not on file  Stress: Not on file  Social Connections: Not on file     Review of Systems: A 12 point ROS discussed and pertinent positives are indicated in the HPI above.  All other systems are negative.  Review of Systems  Constitutional: Negative for fatigue and fever.  Respiratory: Negative for cough and shortness of breath.   Cardiovascular: Negative for chest pain.  Gastrointestinal: Negative for abdominal pain, nausea and vomiting.  Genitourinary: Negative for dysuria.  Musculoskeletal: Negative for back pain.  Psychiatric/Behavioral: Negative for behavioral problems and confusion.    Vital Signs: BP 136/71   Pulse 77   Temp 98.5 F (36.9 C) (Oral)   Resp 16   SpO2 97%   Physical Exam Vitals and nursing note reviewed.  Constitutional:      General: She is not in acute distress.    Appearance: Normal appearance.  She is not ill-appearing.  HENT:     Mouth/Throat:     Mouth: Mucous membranes are moist.     Pharynx: Oropharynx is clear.  Neck:     Comments: R IJ Port-A-Cath in place.  Site intact.  Scar well-healed. Cardiovascular:     Rate and Rhythm: Normal rate and regular rhythm.  Pulmonary:     Effort: Pulmonary effort is normal. No respiratory distress.     Breath sounds: Normal breath sounds.  Abdominal:     General: Abdomen is flat.     Palpations: Abdomen is soft.  Musculoskeletal:     Cervical back: Normal range of motion and neck supple.  Skin:    General: Skin is warm and dry.  Neurological:     General: No focal deficit present.     Mental Status: She is alert. Mental status is at baseline.  Psychiatric:        Mood and Affect: Mood normal.        Behavior: Behavior normal.        Thought Content: Thought content normal.        Judgment: Judgment normal.      MD Evaluation Airway: WNL Heart: WNL Abdomen: WNL Chest/ Lungs: WNL ASA  Classification: 3 Mallampati/Airway Score: One   Imaging: No results found.  Labs:  CBC: Recent Labs    03/21/20 1219 05/26/20 0955 06/24/20 1034 10/26/20 1215  WBC 5.0 5.3 4.4 6.4  HGB 10.8* 12.0 11.7* 12.1  HCT 32.5* 36.5 35.8* 37.4  PLT 281.0 178 180 167    COAGS: No results for input(s): INR, APTT in the last 8760 hours.  BMP: Recent Labs    01/13/20 1007 05/26/20 0955 06/24/20 1034 10/26/20 1215  NA 138 141 137 137  K 3.0* 4.2 4.2 4.5  CL 98 107 105 105  CO2 31 28 26 27   GLUCOSE 151* 93 126* 101*  BUN 25* 18 23 20   CALCIUM 9.2 10.5* 10.6* 10.5*  CREATININE 0.98  0.94 1.02* 1.00  GFRNONAA 55* 58* 56* 58*  GFRAA >60 >60  --   --     LIVER FUNCTION TESTS: Recent Labs    01/13/20 1007 05/26/20 0955 06/24/20 1034 10/26/20 1215  BILITOT 1.2 0.6 0.6 0.7  AST 15 16 13* 13*  ALT 19 9 7 6   ALKPHOS 73 61 66 65  PROT 5.8* 6.4* 6.3* 6.2*  ALBUMIN 3.6 3.9 3.8 3.9    TUMOR MARKERS: No results for  input(s): AFPTM, CEA, CA199, CHROMGRNA in the last 8760 hours.  Assessment and Plan: Patient with past medical history of Stage IIIB bladder cancer presents at the completion of therapy for Port-A-Cath removal.  IR consulted for removal at the request of Dr. Marin Olp and patient preference although it is noted the patient had Port placed at [facility]. Case reviewed by Dr. Pascal Lux who approves patient for procedure.  Patient presents today in their usual state of health.  She has been NPO and is not currently on blood thinners.    Risks and benefits of image guided port-a-catheter removal was discussed with the patient including, but not limited to bleeding, infection, pneumothorax, or need for additional procedures.  All of the patient's questions were answered, patient is agreeable to proceed. Consent signed and in chart.  Thank you for this interesting consult.  I greatly enjoyed meeting Christina [name] and look forward to participating in their care.  A copy of this report was sent to the requesting provider on this date.  Electronically Signed: Docia Barrier, PA 11/09/2020, 3:11 PM   I spent a total of  30 Minutes   in face to face in clinical consultation, greater than 50% of which was counseling/coordinating care for bladder cancer.

## 2020-11-10 ENCOUNTER — Other Ambulatory Visit: Payer: Self-pay | Admitting: Family Medicine

## 2020-11-10 DIAGNOSIS — Z1231 Encounter for screening mammogram for malignant neoplasm of breast: Secondary | ICD-10-CM

## 2020-11-17 ENCOUNTER — Other Ambulatory Visit: Payer: Self-pay

## 2020-11-17 ENCOUNTER — Ambulatory Visit (INDEPENDENT_AMBULATORY_CARE_PROVIDER_SITE_OTHER): Payer: Medicare PPO

## 2020-11-17 DIAGNOSIS — Z1231 Encounter for screening mammogram for malignant neoplasm of breast: Secondary | ICD-10-CM | POA: Diagnosis not present

## 2020-12-14 ENCOUNTER — Ambulatory Visit: Payer: Medicare PPO

## 2020-12-15 ENCOUNTER — Ambulatory Visit (HOSPITAL_BASED_OUTPATIENT_CLINIC_OR_DEPARTMENT_OTHER)
Admission: RE | Admit: 2020-12-15 | Discharge: 2020-12-15 | Disposition: A | Discharge status: Home / Self Care | Payer: Medicare PPO | Source: Ambulatory Visit | Attending: Hematology & Oncology | Admitting: Hematology & Oncology

## 2020-12-15 ENCOUNTER — Telehealth: Payer: Self-pay | Admitting: *Deleted

## 2020-12-15 ENCOUNTER — Inpatient Hospital Stay: Payer: Medicare PPO | Attending: Hematology & Oncology

## 2020-12-15 ENCOUNTER — Encounter (HOSPITAL_BASED_OUTPATIENT_CLINIC_OR_DEPARTMENT_OTHER): Payer: Self-pay

## 2020-12-15 ENCOUNTER — Encounter: Payer: Self-pay | Admitting: Hematology & Oncology

## 2020-12-15 ENCOUNTER — Other Ambulatory Visit: Payer: Self-pay

## 2020-12-15 ENCOUNTER — Inpatient Hospital Stay: Payer: Medicare PPO

## 2020-12-15 ENCOUNTER — Inpatient Hospital Stay (HOSPITAL_BASED_OUTPATIENT_CLINIC_OR_DEPARTMENT_OTHER): Payer: Medicare PPO | Admitting: Hematology & Oncology

## 2020-12-15 VITALS — BP 116/70 | HR 75 | Temp 98.2°F | Resp 18 | Ht 65.0 in | Wt 154.0 lb

## 2020-12-15 DIAGNOSIS — C670 Malignant neoplasm of trigone of bladder: Secondary | ICD-10-CM | POA: Insufficient documentation

## 2020-12-15 DIAGNOSIS — Z79899 Other long term (current) drug therapy: Secondary | ICD-10-CM | POA: Insufficient documentation

## 2020-12-15 DIAGNOSIS — Z9221 Personal history of antineoplastic chemotherapy: Secondary | ICD-10-CM | POA: Diagnosis not present

## 2020-12-15 DIAGNOSIS — C679 Malignant neoplasm of bladder, unspecified: Secondary | ICD-10-CM | POA: Diagnosis not present

## 2020-12-15 DIAGNOSIS — M160 Bilateral primary osteoarthritis of hip: Secondary | ICD-10-CM | POA: Diagnosis not present

## 2020-12-15 DIAGNOSIS — I700 Atherosclerosis of aorta: Secondary | ICD-10-CM | POA: Diagnosis not present

## 2020-12-15 DIAGNOSIS — K5750 Diverticulosis of both small and large intestine without perforation or abscess without bleeding: Secondary | ICD-10-CM | POA: Diagnosis not present

## 2020-12-15 DIAGNOSIS — C671 Malignant neoplasm of dome of bladder: Secondary | ICD-10-CM | POA: Diagnosis not present

## 2020-12-15 DIAGNOSIS — D3501 Benign neoplasm of right adrenal gland: Secondary | ICD-10-CM | POA: Diagnosis not present

## 2020-12-15 DIAGNOSIS — M47814 Spondylosis without myelopathy or radiculopathy, thoracic region: Secondary | ICD-10-CM | POA: Diagnosis not present

## 2020-12-15 LAB — CBC WITH DIFFERENTIAL (CANCER CENTER ONLY)
Abs Immature Granulocytes: 0.01 10*3/uL (ref 0.00–0.07)
Basophils Absolute: 0 10*3/uL (ref 0.0–0.1)
Basophils Relative: 1 %
Eosinophils Absolute: 0.1 10*3/uL (ref 0.0–0.5)
Eosinophils Relative: 1 %
HCT: 40.1 % (ref 36.0–46.0)
Hemoglobin: 13.1 g/dL (ref 12.0–15.0)
Immature Granulocytes: 0 %
Lymphocytes Relative: 31 %
Lymphs Abs: 1.8 10*3/uL (ref 0.7–4.0)
MCH: 32.8 pg (ref 26.0–34.0)
MCHC: 32.7 g/dL (ref 30.0–36.0)
MCV: 100.5 fL — ABNORMAL HIGH (ref 80.0–100.0)
Monocytes Absolute: 0.5 10*3/uL (ref 0.1–1.0)
Monocytes Relative: 8 %
Neutro Abs: 3.5 10*3/uL (ref 1.7–7.7)
Neutrophils Relative %: 59 %
Platelet Count: 190 10*3/uL (ref 150–400)
RBC: 3.99 MIL/uL (ref 3.87–5.11)
RDW: 13.1 % (ref 11.5–15.5)
WBC Count: 5.8 10*3/uL (ref 4.0–10.5)
nRBC: 0 % (ref 0.0–0.2)

## 2020-12-15 LAB — CMP (CANCER CENTER ONLY)
ALT: 7 U/L (ref 0–44)
AST: 14 U/L — ABNORMAL LOW (ref 15–41)
Albumin: 4.1 g/dL (ref 3.5–5.0)
Alkaline Phosphatase: 70 U/L (ref 38–126)
Anion gap: 7 (ref 5–15)
BUN: 25 mg/dL — ABNORMAL HIGH (ref 8–23)
CO2: 29 mmol/L (ref 22–32)
Calcium: 10.8 mg/dL — ABNORMAL HIGH (ref 8.9–10.3)
Chloride: 104 mmol/L (ref 98–111)
Creatinine: 1.25 mg/dL — ABNORMAL HIGH (ref 0.44–1.00)
GFR, Estimated: 44 mL/min — ABNORMAL LOW (ref >60)
Glucose, Bld: 134 mg/dL — ABNORMAL HIGH (ref 70–99)
Potassium: 4.4 mmol/L (ref 3.5–5.1)
Sodium: 140 mmol/L (ref 135–145)
Total Bilirubin: 0.8 mg/dL (ref 0.3–1.2)
Total Protein: 6.6 g/dL (ref 6.5–8.1)

## 2020-12-15 LAB — LACTATE DEHYDROGENASE: LDH: 134 U/L (ref 98–192)

## 2020-12-15 MED ORDER — IOHEXOL 300 MG/ML  SOLN
100.0000 mL | Freq: Once | INTRAMUSCULAR | Status: AC | PRN
  Administered 2020-12-15: 80 mL via INTRAVENOUS

## 2020-12-15 NOTE — Progress Notes (Signed)
Hematology and Oncology Follow Up Visit  ALEGRIA DOMINIQUE [id number] [birth year] 80 y.o. 12/15/2020   Principle Diagnosis:   Stage IIIB (T1N3M0) invasive urothelial carcinoma of the bladder   Current Therapy:    Status post neoadjuvant chemotherapy with ddMVAC  Radical cystectomy on 03/03/2020      Interim History:  Ms. Bilyeu is back for follow-up.  She is doing quite well.  We saw her about 3 months ago.  She is doing nicely.  She had very nice Easter with her family.  She did have a CT scan done today.  The CT scan did not show any evidence of recurrent disease.  She has had no problems with her urostomy.  Urine is nice and clear today.  Has had no problems with bowels.  She has had no issues with leg swelling.  She has had no rashes.  There has been no cough or shortness of breath.  She has had no headache.  Overall, I would say performance status is ECOG 1.      Medications:  Current Outpatient Medications:  .  famotidine (PEPCID) 20 MG tablet, Take by mouth., Disp: , Rfl:  .  lactulose (CHRONULAC) 10 GM/15ML solution, Take by mouth., Disp: , Rfl:  .  magnesium oxide (MAG-OX) 400 MG tablet, Take by mouth., Disp: , Rfl:  .  PARoxetine (PAXIL) 20 MG tablet, Take 1 tablet (20 mg total) by mouth daily., Disp: 90 tablet, Rfl: 1 .  potassium chloride SA (KLOR-CON) 20 MEQ tablet, Take 2 tablets (40 mEq total) by mouth daily., Disp: 14 tablet, Rfl: 0 .  DPH-Lido-AlHydr-MgHydr-Simeth (FIRST-MOUTHWASH BLM) SUSP, Swish and spit 5 mLs every 6 (six) hours as needed (Patient not taking: No sig reported), Disp: , Rfl:  .  LORazepam (ATIVAN) 0.5 MG tablet, Take 1 tablet (0.5 mg total) by mouth 2 (two) times daily as needed for anxiety. (Patient not taking: Reported on 12/15/2020), Disp: 60 tablet, Rfl: 5 No current facility-administered medications for this visit.  Facility-Administered Medications Ordered in Other Visits:  .  sodium chloride flush (NS) 0.9 % injection 10 mL, 10 mL,  Intravenous, PRN, Volanda Napoleon, MD, 10 mL at 10/26/20 1255  Allergies:  Allergies  Allergen Reactions  . Nutrasweet Aspartame [Aspartame] Diarrhea  . Lisinopril Cough    Past Medical History, Surgical history, Social history, and Family History were reviewed and updated.  Review of Systems: Review of Systems  Constitutional: Negative.   HENT:  Negative.   Eyes: Negative.   Respiratory: Negative.   Cardiovascular: Negative.   Gastrointestinal: Negative.   Endocrine: Negative.   Genitourinary: Negative.    Musculoskeletal: Negative.   Skin: Negative.   Neurological: Negative.   Hematological: Negative.   Psychiatric/Behavioral: Negative.     Physical Exam:  height is 5\' 5"  (1.651 m) and weight is 154 lb (69.9 kg). Her oral temperature is 98.2 F (36.8 C). Her blood pressure is 116/70 and her pulse is 75. Her respiration is 18 and oxygen saturation is 100%.   Wt Readings from Last 3 Encounters:  12/15/20 154 lb (69.9 kg)  10/26/20 150 lb (68 kg)  06/24/20 144 lb (65.3 kg)    Physical Exam Vitals reviewed.  HENT:     Head: Normocephalic and atraumatic.  Eyes:     Pupils: Pupils are equal, round, and reactive to light.  Cardiovascular:     Rate and Rhythm: Normal rate and regular rhythm.     Heart sounds: Normal heart sounds.  Pulmonary:  Effort: Pulmonary effort is normal.     Breath sounds: Normal breath sounds.  Abdominal:     General: Bowel sounds are normal.     Palpations: Abdomen is soft.     Comments: Abdominal exam shows the healed laparotomy scar in the midline.  She has some firmness in the middle of the laparotomy scar.  She has the urostomy bag which is well-positioned.  Urine is clear.  There is no fluid wave in the abdomen.  Bowel sounds are present.  She has no palpable liver or spleen tip.  Musculoskeletal:        General: No tenderness or deformity. Normal range of motion.     Cervical back: Normal range of motion.  Lymphadenopathy:      Cervical: No cervical adenopathy.  Skin:    General: Skin is warm and dry.     Findings: No erythema or rash.  Neurological:     Mental Status: She is alert and oriented to person, place, and time.  Psychiatric:        Behavior: Behavior normal.        Thought Content: Thought content normal.        Judgment: Judgment normal.     Lab Results  Component Value Date   WBC 5.8 12/15/2020   HGB 13.1 12/15/2020   HCT 40.1 12/15/2020   MCV 100.5 (H) 12/15/2020   PLT 190 12/15/2020     Chemistry      Component Value Date/Time   NA 140 12/15/2020 1058   NA 142 03/04/2017 1016   K 4.4 12/15/2020 1058   CL 104 12/15/2020 1058   CO2 29 12/15/2020 1058   BUN 25 (H) 12/15/2020 1058   BUN 13 03/04/2017 1016   CREATININE 1.25 (H) 12/15/2020 1058   CREATININE 0.78 05/20/2015 1704      Component Value Date/Time   CALCIUM 10.8 (H) 12/15/2020 1058   ALKPHOS 70 12/15/2020 1058   AST 14 (L) 12/15/2020 1058   ALT 7 12/15/2020 1058   BILITOT 0.8 12/15/2020 1058      Impression and Plan: Ms. Eimer is a 80 year old white female.  She had superficial bladder cancer.  She was treated with intravesicular therapy.  However, she has had had muscle invasive cancer which was poorly differentiated.  She then underwent neoadjuvant chemotherapy followed by radical cystectomy.  She still is found to have stage IIIB disease.  She definitely has a high risk of recurrence.  She needs to be followed with CAT scans every 3 months.  We will set her skin up in July..  I will plan to see her back in July.  We will do the CT scan the same day that we see her.   Volanda Napoleon, MD 4/21/20221:15 PM

## 2020-12-15 NOTE — Telephone Encounter (Signed)
Per 12/15/20 los gave patient upcoming appointments

## 2020-12-22 DIAGNOSIS — K9409 Other complications of colostomy: Secondary | ICD-10-CM | POA: Diagnosis not present

## 2020-12-22 DIAGNOSIS — C7911 Secondary malignant neoplasm of bladder: Secondary | ICD-10-CM | POA: Diagnosis not present

## 2020-12-22 DIAGNOSIS — Z936 Other artificial openings of urinary tract status: Secondary | ICD-10-CM | POA: Diagnosis not present

## 2020-12-28 ENCOUNTER — Other Ambulatory Visit: Payer: Self-pay

## 2020-12-28 ENCOUNTER — Ambulatory Visit (INDEPENDENT_AMBULATORY_CARE_PROVIDER_SITE_OTHER): Payer: Medicare PPO

## 2020-12-28 VITALS — BP 122/68 | HR 76 | Temp 97.7°F | Resp 16 | Ht 65.0 in | Wt 154.8 lb

## 2020-12-28 DIAGNOSIS — Z0000 Encounter for general adult medical examination without abnormal findings: Secondary | ICD-10-CM

## 2020-12-28 NOTE — Progress Notes (Signed)
Subjective:   Christina [name] is a 80 y.o. female who presents for Medicare Annual (Subsequent) preventive examination.  Review of Systems     Cardiac Risk Factors include: advanced age (>28men, >59 women);dyslipidemia;hypertension     Objective:    Today's Vitals   12/28/20 1329  BP: 122/68  Pulse: 76  Resp: 16  Temp: 97.7 F (36.5 C)  TempSrc: Temporal  SpO2: 97%  Weight: 154 lb 12.8 oz (70.2 kg)  Height: 5\' 5"  (1.651 m)   Body mass index is 25.76 kg/m.  Advanced Directives 12/28/2020 12/15/2020 11/09/2020 10/26/2020 06/24/2020 05/26/2020 11/10/2019  Does Patient Have a Medical Advance Directive? Yes Yes Yes Yes Yes Yes Yes  Type of Paramedic of [place];Living will [place];Living will Living will;Healthcare Power of Attorney Living will;Healthcare Power of Attorney Living will;Healthcare Power of Attorney Living will;Healthcare Power of [place];Living will  Does patient want to make changes to medical advance directive? - No - Patient declined No - Patient declined No - Patient declined - No - Patient declined No - Patient declined  Copy of [place] in Chart? No - copy requested No - copy requested No - copy requested No - copy requested - - No - copy requested  Would patient like information on creating a medical advance directive? - - - - - - -    Current Medications (verified) Outpatient Encounter Medications as of 12/28/2020  Medication Sig  . famotidine (PEPCID) 20 MG tablet Take by mouth.  . lactulose (CHRONULAC) 10 GM/15ML solution Take by mouth.  . magnesium oxide (MAG-OX) 400 MG tablet Take by mouth.  [name] PARoxetine (PAXIL) 20 MG tablet Take 1 tablet (20 mg total) by mouth daily.  . potassium chloride SA (KLOR-CON) 20 MEQ tablet Take 2 tablets (40 mEq total) by mouth daily.  . DPH-Lido-AlHydr-MgHydr-Simeth (FIRST-MOUTHWASH BLM) SUSP Swish and spit 5 mLs every 6 (six) hours  as needed (Patient not taking: No sig reported)  . LORazepam (ATIVAN) 0.5 MG tablet Take 1 tablet (0.5 mg total) by mouth 2 (two) times daily as needed for anxiety. (Patient not taking: No sig reported)   Facility-Administered Encounter Medications as of 12/28/2020  Medication  . sodium chloride flush (NS) 0.9 % injection 10 mL    Allergies (verified) Nutrasweet aspartame [aspartame] and Lisinopril   History: Past Medical History:  Diagnosis Date  . Arthritis   . Asthma    a little?, no problems in several years  . Bladder cancer ([place])   . Bladder tumor   . Cervical cancer screening 01/31/2012  . Chicken pox as a child  . Degenerative tear of acetabular labrum of left hip 02/26/2019  . Depression with anxiety 06/07/2009   Qualifier: Diagnosis of  By: [name] MD, [name]    . Dermatitis of external ear 07/11/2012  . Diverticulosis   . Ganglion cyst 02/26/2019   Right elbow  . Hiatal hernia with gastroesophageal reflux 10/26/2010   Qualifier: Diagnosis of  By: [name] MD, [name]    . Hyperglycemia 06/21/2013  . Hyperlipidemia, mixed 10/16/2015   pt unaware  . Hypertension   . Left hip pain 07/10/2015  . Lesion of breast 12/03/2016   benign; resolved  . Low back pain 05/29/2015  . Measles as a child  . Mumps as a child  . Osteopenia 01/03/2017  . Overweight (BMI 25.0-29.9) 10/07/2008   Qualifier: Diagnosis of  By: [name] MD, [name]    . Palpitations  several years ago, not currently  . Pre-diabetes   . Psoriasis    ears  . RUQ pain 05/29/2015  . Spider veins    Bilateral legs  . Spinal stenosis   . Tailor's bunionette, left 09/02/2017  . Umbilical hernia   . Urinary frequency 09/28/2011  . Vertigo   . Wears dentures    Upper,   Past Surgical History:  Procedure Laterality Date  . ABDOMINAL SURGERY  1970's  . BREAST BIOPSY Right 1980   BREAST EXCISIONAL BIOPSY  . BREAST BIOPSY Left 1970   BREAST EXCISIONAL BIOPSY  . BREAST CYST ASPIRATION    . CATARACT  EXTRACTION, BILATERAL    . COLONOSCOPY    . CYSTECTOMY     abdomen  . CYSTOSCOPY W/ RETROGRADES Bilateral 04/27/2019   Procedure: CYSTOSCOPY WITH RETROGRADE PYELOGRAM;  Surgeon: Cleon Gustin, MD;  Location[facility];  Service: Urology;  Laterality: Bilateral;  . EYE SURGERY Bilateral    cataract  . INSERTION OF MESH N/A 06/11/2017   Procedure: INSERTION OF MESH;  Surgeon: Rolm Bookbinder, MD;  Location: [place];  Service: General;  Laterality: N/A;  BILATERAL TAP BLOCK  . IR REMOVAL TUN ACCESS W/ PORT W/O FL MOD SED  11/09/2020  . TRANSURETHRAL RESECTION OF BLADDER TUMOR N/A 04/27/2019   Procedure: TRANSURETHRAL RESECTION OF BLADDER TUMOR (TURBT);  Surgeon: Cleon Gustin, MD;  Location[facility];  Service: Urology;  Laterality: N/A;  1 HR  . TRANSURETHRAL RESECTION OF BLADDER TUMOR N/A 06/01/2019   Procedure: TRANSURETHRAL RESECTION OF BLADDER TUMOR (TURBT);  Surgeon: Cleon Gustin, MD;  Location: [facility];  Service: Urology;  Laterality: N/A;  1 HR  . TRANSURETHRAL RESECTION OF BLADDER TUMOR N/A 10/22/2019   Procedure: TRANSURETHRAL RESECTION OF BLADDER TUMOR (TURBT);  Surgeon: Cleon Gustin, MD;  Location: [facility];  Service: Urology;  Laterality: N/A;  . TUBAL LIGATION  1970  . VENTRAL HERNIA REPAIR N/A 06/11/2017   Procedure: LAPAROSCOPIC VENTRAL HERNIA REPAIR WITH MESH ERAS PATHWAY;  Surgeon: Rolm Bookbinder, MD;  Location: [place];  Service: General;  Laterality: N/A;  BILATERAL TAP BLOCK   Family History  Problem Relation Age of Onset  . Hypertension Mother   . Anxiety disorder Mother   . Ovarian cancer Mother 37       lung- smoker, ovarian  . Lung cancer Mother        smoker  . Arthritis Mother   . Hyperlipidemia Mother   . Alcohol abuse Father   . Diabetes Sister   . Hypertension Sister   . Ovarian cancer Sister   . Arthritis Sister   . Depression Sister   . Hyperlipidemia  Sister   . Other Maternal Grandmother        pacemaker  . Multiple sclerosis Maternal Grandfather        ?  [name] Arthritis Brother   . COPD Brother   . Heart attack Brother   . Hyperlipidemia Brother   . Stroke Brother   . Asthma Sister   . Depression Sister   . Hyperlipidemia Sister   . Breast cancer Sister   . Asthma Sister   . Depression Sister   . Asthma Paternal Uncle    Social History   Socioeconomic History  . Marital status: Widowed    Spouse name: Not on file  . Number of children: 2  . Years of education: Not on file  . Highest education level: Not  on file  Occupational History  . Occupation: retired  Tobacco Use  . Smoking status: Former Smoker    Packs/day: 1.00    Years: 30.00    Pack years: 30.00    Types: Cigarettes    Quit date: 08/27/1990    Years since quitting: 30.3  . Smokeless tobacco: Never Used  Vaping Use  . Vaping Use: Never used  Substance and Sexual Activity  . Alcohol use: No  . Drug use: No  . Sexual activity: Not Currently    Birth control/protection: Post-menopausal    Comment: lives alone, no dietary restrictions  Other Topics Concern  . Not on file  Social History Narrative   Retired. Lives alone.    Attended some business college.    Former smoker.    Smoke alarm in the home. Wears seat balt.    Wears dentures.    Feels safe in her relationships.       Social Determinants of Health   Financial Resource Strain: Low Risk   . Difficulty of Paying Living Expenses: Not hard at all  Food Insecurity: No Food Insecurity  . Worried About Charity fundraiser in the Last Year: Never true  . Ran Out of Food in the Last Year: Never true  Transportation Needs: No Transportation Needs  . Lack of Transportation (Medical): No  . Lack of Transportation (Non-Medical): No  Physical Activity: Insufficiently Active  . Days of Exercise per Week: 2 days  . Minutes of Exercise per Session: 50 min  Stress: No Stress Concern Present  . Feeling  of Stress : Not at all  Social Connections: Moderately Integrated  . Frequency of Communication with Friends and Family: More than three times a week  . Frequency of Social Gatherings with Friends and Family: More than three times a week  . Attends Religious Services: More than 4 times per year  . Active Member of Clubs or Organizations: Yes  . Attends Archivist Meetings: More than 4 times per year  . Marital Status: Widowed    Tobacco Counseling Counseling given: Not Answered   Clinical Intake:  Pre-visit preparation completed: Yes  Pain : No/denies pain     Nutritional Status: BMI 25 -29 Overweight Nutritional Risks: None Diabetes: No  How often do you need to have someone help you when you read instructions, pamphlets, or other written materials from your doctor or pharmacy?: 1 - Never  Diabetic?No  Interpreter Needed?: No  Information entered by :: Caroleen Hamman LPN   Activities of Daily Living In your present state of health, do you have any difficulty performing the following activities: 12/28/2020  Hearing? N  Vision? N  Difficulty concentrating or making decisions? Y  Comment patient states she has some memory loss due to Chemo  Walking or climbing stairs? N  Dressing or bathing? N  Doing errands, shopping? N  Preparing Food and eating ? N  Using the Toilet? N  In the past six months, have you accidently leaked urine? N  Do you have problems with loss of bowel control? N  Managing your Medications? N  Managing your Finances? N  Housekeeping or managing your Housekeeping? N  Some recent data might be hidden    Patient Care Team: Ma Hillock, DO as PCP - General (Family Medicine) Rolm Bookbinder, MD as Consulting Physician (General Surgery) Monna Fam, MD as Consulting Physician (Ophthalmology) Garry Heater, DDS as Consulting Physician (Dentistry) Juanita Craver, MD as Consulting Physician (Gastroenterology) Burney Gauze  R, MD  as Medical Oncologist (Oncology)  Indicate any recent Medical Services you may have received from other than [facility] providers in the past year (date may be approximate).     Assessment:   This is a routine wellness examination for Sherlyne.  Hearing/Vision screen  Hearing Screening   125Hz  250Hz  500Hz  1000Hz  2000Hz  3000Hz  4000Hz  6000Hz  8000Hz   Right ear:           Left ear:           Comments: No issues  Vision Screening Comments: Last eye exam-04/2020  Dietary issues and exercise activities discussed: Current Exercise Habits: Home exercise routine, Type of exercise: walking (yard work), Time (Minutes): 45, Frequency (Times/Week): 2, Weekly Exercise (Minutes/Week): 90, Intensity: Mild, Exercise limited by: None identified  Goals Addressed            This Visit's Progress   . Patient Stated       Eating healthier & drink more water      Depression Screen PHQ 2/9 Scores 12/28/2020 05/30/2020 01/14/2020 09/16/2019 06/05/2018 03/06/2018 12/17/2017  PHQ - 2 Score 0 1 0 1 0 0 0  PHQ- 9 Score - 2 - - - - -    Fall Risk Fall Risk  12/28/2020 05/30/2020 02/09/2019 03/06/2018 12/17/2017  Falls in the past year? 0 0 - No No  Number falls in past yr: 0 0 - - -  Injury with Fall? 0 0 - - -  Follow up Falls prevention discussed Falls evaluation completed Education provided - -    FALL RISK PREVENTION PERTAINING TO THE HOME:  Any stairs in or around the home? Yes  If so, are there any without handrails? No  Home free of loose throw rugs in walkways, pet beds, electrical cords, etc? Yes  Adequate lighting in your home to reduce risk of falls? Yes   ASSISTIVE DEVICES UTILIZED TO PREVENT FALLS:  Life alert? No  Use of a cane, walker or w/c? No  Grab bars in the bathroom? Yes  Shower chair or bench in shower? Yes  Elevated toilet seat or a handicapped toilet? No   TIMED UP AND GO:  Was the test performed? Yes .  Length of time to ambulate 10 feet: 10 sec.   Gait steady and fast without  use of assistive device  Cognitive Function: MMSE - Mini Mental State Exam 10/05/2016  Orientation to time 5  Orientation to Place 5  Registration 3  Attention/ Calculation 5  Recall 3  Language- name 2 objects 2  Language- repeat 1  Language- follow 3 step command 3  Language- read & follow direction 1  Write a sentence 1  Copy design 1  Total score 30     6CIT Screen 12/28/2020  What Year? 0 points  What month? 0 points  What time? 0 points  Count back from 20 0 points  Months in reverse 2 points  Repeat phrase 0 points  Total Score 2    Immunizations Immunization History  Administered Date(s) Administered  . Influenza Whole 06/07/2009, 05/28/2011, 05/28/2012  . Influenza, High Dose Seasonal PF 05/20/2015  . Influenza,inj,Quad PF,6+ Mos 05/26/2013, 05/31/2014  . Moderna Sars-Covid-2 Vaccination 12/11/2019, 02/12/2020, 09/27/2020  . Pneumococcal Conjugate-13 05/31/2014  . Pneumococcal Polysaccharide-23 06/07/2009  . Td 01/31/2003  . Tdap 09/28/2011    TDAP status: Up to date  Flu Vaccine status: Declined, Education has been provided regarding the importance of this vaccine but patient still declined. Advised may receive this vaccine  at local pharmacy or Health Dept. Aware to provide a copy of the vaccination record if obtained from local pharmacy or Health Dept. Verbalized acceptance and understanding.  Pneumococcal vaccine status: Up to date  Covid-19 vaccine status: Completed vaccines  Qualifies for Shingles Vaccine? Yes   Zostavax completed No   Shingrix Completed?: No.    Education has been provided regarding the importance of this vaccine. Patient has been advised to call insurance company to determine out of pocket expense if they have not yet received this vaccine. Advised may also receive vaccine at local pharmacy or Health Dept. Verbalized acceptance and understanding.  Screening Tests Health Maintenance  Topic Date Due  . Hepatitis C Screening  Never  done  . COVID-19 Vaccine (4 - Booster for Moderna series) 03/27/2021  . DEXA SCAN  09/05/2021  . TETANUS/TDAP  09/27/2021  . MAMMOGRAM  11/18/2022  . PNA vac Low Risk Adult  Completed  . HPV VACCINES  Aged Out  . INFLUENZA VACCINE  Discontinued    Health Maintenance  Health Maintenance Due  Topic Date Due  . Hepatitis C Screening  Never done    Colorectal cancer screening: No longer required.   Mammogram status: Completed Bilateral 11/17/2020. Repeat every year  Bone Density status:Due-Declined today  Lung Cancer Screening: (Low Dose CT Chest recommended if Age 63-80 years, 30 pack-year currently smoking OR have quit w/in 15years.) does not qualify.     Additional Screening:  Hepatitis C Screening: does not qualify  Vision Screening: Recommended annual ophthalmology exams for early detection of glaucoma and other disorders of the eye. Is the patient up to date with their annual eye exam?  Yes  Who is the provider or what is the name of the office in which the patient attends annual eye exams? Unsure of name   Dental Screening: Recommended annual dental exams for proper oral hygiene  Community Resource Referral / Chronic Care Management: CRR required this visit?  No   CCM required this visit?  No      Plan:     I have personally reviewed and noted the following in the patient's chart:   . Medical and social history . Use of alcohol, tobacco or illicit drugs  . Current medications and supplements including opioid prescriptions.  . Functional ability and status . Nutritional status . Physical activity . Advanced directives . List of other physicians . Hospitalizations, surgeries, and ER visits in previous 12 months . Vitals . Screenings to include cognitive, depression, and falls . Referrals and appointments  In addition, I have reviewed and discussed with patient certain preventive protocols, quality metrics, and best practice recommendations. A written  personalized care plan for preventive services as well as general preventive health recommendations were provided to patient.   Patient to access avs on mychart  Marta Antu, [place]   09/01/3844  Nurse Health Advisor  Nurse Notes: None

## 2020-12-28 NOTE — Patient Instructions (Signed)
Christina [name] , Thank you for taking time to come for your Medicare Wellness Visit. I appreciate your ongoing commitment to your health goals. Please review the following plan we discussed and let me know if I can assist you in the future.   Screening recommendations/referrals: Colonoscopy: No longer required Mammogram: Completed 11/17/2020-Due 11/17/2021 Bone Density: Due- declined today Recommended yearly ophthalmology/optometry visit for glaucoma screening and checkup Recommended yearly dental visit for hygiene and checkup  Vaccinations: Influenza vaccine: Declined Pneumococcal vaccine: Completed vaccines Tdap vaccine: Up to date-Due-09/27/2021 Shingles vaccine: Discuss with pharmacy   Covid-19:Up to date  Advanced directives: Please bring a copy for your chart  Conditions/risks identified: See problem list  Next appointment: Follow up in one year for your annual wellness visit    Preventive Care 65 Years and Older, Female Preventive care refers to lifestyle choices and visits with your health care provider that can promote health and wellness. What does preventive care include?  A yearly physical exam. This is also called an annual well check.  Dental exams once or twice a year.  Routine eye exams. Ask your health care provider how often you should have your eyes checked.  Personal lifestyle choices, including:  Daily care of your teeth and gums.  Regular physical activity.  Eating a healthy diet.  Avoiding tobacco and drug use.  Limiting alcohol use.  Practicing safe sex.  Taking low-dose aspirin every day.  Taking vitamin and mineral supplements as recommended by your health care provider. What happens during an annual well check? The services and screenings done by your health care provider during your annual well check will depend on your age, overall health, lifestyle risk factors, and family history of disease. Counseling  Your health care provider may ask  you questions about your:  Alcohol use.  Tobacco use.  Drug use.  Emotional well-being.  Home and relationship well-being.  Sexual activity.  Eating habits.  History of falls.  Memory and ability to understand (cognition).  Work and work Statistician.  Reproductive health. Screening  You may have the following tests or measurements:  Height, weight, and BMI.  Blood pressure.  Lipid and cholesterol levels. These may be checked every 5 years, or more frequently if you are over 67 years old.  Skin check.  Lung cancer screening. You may have this screening every year starting at age 31 if you have a 30-pack-year history of smoking and currently smoke or have quit within the past 15 years.  Fecal occult blood test (FOBT) of the stool. You may have this test every year starting at age 12.  Flexible sigmoidoscopy or colonoscopy. You may have a sigmoidoscopy every 5 years or a colonoscopy every 10 years starting at age 50.  Hepatitis C blood test.  Hepatitis B blood test.  Sexually transmitted disease (STD) testing.  Diabetes screening. This is done by checking your blood sugar (glucose) after you have not eaten for a while (fasting). You may have this done every 1-3 years.  Bone density scan. This is done to screen for osteoporosis. You may have this done starting at age 82.  Mammogram. This may be done every 1-2 years. Talk to your health care provider about how often you should have regular mammograms. Talk with your health care provider about your test results, treatment options, and if necessary, the need for more tests. Vaccines  Your health care provider may recommend certain vaccines, such as:  Influenza vaccine. This is recommended every year.  Tetanus, diphtheria,  and acellular pertussis (Tdap, Td) vaccine. You may need a Td booster every 10 years.  Zoster vaccine. You may need this after age 74.  Pneumococcal 13-valent conjugate (PCV13) vaccine. One dose  is recommended after age 22.  Pneumococcal polysaccharide (PPSV23) vaccine. One dose is recommended after age 50. Talk to your health care provider about which screenings and vaccines you need and how often you need them. This information is not intended to replace advice given to you by your health care provider. Make sure you discuss any questions you have with your health care provider. Document Released: 09/09/2015 Document Revised: 05/02/2016 Document Reviewed: 06/14/2015 Elsevier Interactive Patient Education  2017 [place] Prevention in the Home Falls can cause injuries. They can happen to people of all ages. There are many things you can do to make your home safe and to help prevent falls. What can I do on the outside of my home?  Regularly fix the edges of walkways and driveways and fix any cracks.  Remove anything that might make you trip as you walk through a door, such as a raised step or threshold.  Trim any bushes or trees on the path to your home.  Use bright outdoor lighting.  Clear any walking paths of anything that might make someone trip, such as rocks or tools.  Regularly check to see if handrails are loose or broken. Make sure that both sides of any steps have handrails.  Any raised decks and porches should have guardrails on the edges.  Have any leaves, snow, or ice cleared regularly.  Use sand or salt on walking paths during winter.  Clean up any spills in your garage right away. This includes oil or grease spills. What can I do in the bathroom?  Use night lights.  Install grab bars by the toilet and in the tub and shower. Do not use towel bars as grab bars.  Use non-skid mats or decals in the tub or shower.  If you need to sit down in the shower, use a plastic, non-slip stool.  Keep the floor dry. Clean up any water that spills on the floor as soon as it happens.  Remove soap buildup in the tub or shower regularly.  Attach bath mats  securely with double-sided non-slip rug tape.  Do not have throw rugs and other things on the floor that can make you trip. What can I do in the bedroom?  Use night lights.  Make sure that you have a light by your bed that is easy to reach.  Do not use any sheets or blankets that are too big for your bed. They should not hang down onto the floor.  Have a firm chair that has side arms. You can use this for support while you get dressed.  Do not have throw rugs and other things on the floor that can make you trip. What can I do in the kitchen?  Clean up any spills right away.  Avoid walking on wet floors.  Keep items that you use a lot in easy-to-reach places.  If you need to reach something above you, use a strong step stool that has a grab bar.  Keep electrical cords out of the way.  Do not use floor polish or wax that makes floors slippery. If you must use wax, use non-skid floor wax.  Do not have throw rugs and other things on the floor that can make you trip. What can I do with my  stairs?  Do not leave any items on the stairs.  Make sure that there are handrails on both sides of the stairs and use them. Fix handrails that are broken or loose. Make sure that handrails are as long as the stairways.  Check any carpeting to make sure that it is firmly attached to the stairs. Fix any carpet that is loose or worn.  Avoid having throw rugs at the top or bottom of the stairs. If you do have throw rugs, attach them to the floor with carpet tape.  Make sure that you have a light switch at the top of the stairs and the bottom of the stairs. If you do not have them, ask someone to add them for you. What else can I do to help prevent falls?  Wear shoes that:  Do not have high heels.  Have rubber bottoms.  Are comfortable and fit you well.  Are closed at the toe. Do not wear sandals.  If you use a stepladder:  Make sure that it is fully opened. Do not climb a closed  stepladder.  Make sure that both sides of the stepladder are locked into place.  Ask someone to hold it for you, if possible.  Clearly mark and make sure that you can see:  Any grab bars or handrails.  First and last steps.  Where the edge of each step is.  Use tools that help you move around (mobility aids) if they are needed. These include:  Canes.  Walkers.  Scooters.  Crutches.  Turn on the lights when you go into a dark area. Replace any light bulbs as soon as they burn out.  Set up your furniture so you have a clear path. Avoid moving your furniture around.  If any of your floors are uneven, fix them.  If there are any pets around you, be aware of where they are.  Review your medicines with your doctor. Some medicines can make you feel dizzy. This can increase your chance of falling. Ask your doctor what other things that you can do to help prevent falls. This information is not intended to replace advice given to you by your health care provider. Make sure you discuss any questions you have with your health care provider. Document Released: 06/09/2009 Document Revised: 01/19/2016 Document Reviewed: 09/17/2014 Elsevier Interactive Patient Education  2017 [employer].

## 2021-01-03 ENCOUNTER — Ambulatory Visit: Payer: Medicare PPO | Admitting: Family Medicine

## 2021-01-11 ENCOUNTER — Other Ambulatory Visit: Payer: Self-pay

## 2021-01-11 ENCOUNTER — Encounter: Payer: Self-pay | Admitting: Family Medicine

## 2021-01-11 ENCOUNTER — Ambulatory Visit: Payer: Medicare PPO | Admitting: Family Medicine

## 2021-01-11 VITALS — BP 113/64 | HR 74 | Temp 98.3°F | Ht 65.0 in | Wt 155.0 lb

## 2021-01-11 DIAGNOSIS — M533 Sacrococcygeal disorders, not elsewhere classified: Secondary | ICD-10-CM

## 2021-01-11 DIAGNOSIS — J209 Acute bronchitis, unspecified: Secondary | ICD-10-CM

## 2021-01-11 DIAGNOSIS — M47817 Spondylosis without myelopathy or radiculopathy, lumbosacral region: Secondary | ICD-10-CM

## 2021-01-11 DIAGNOSIS — M5416 Radiculopathy, lumbar region: Secondary | ICD-10-CM | POA: Diagnosis not present

## 2021-01-11 DIAGNOSIS — M24152 Other articular cartilage disorders, left hip: Secondary | ICD-10-CM | POA: Diagnosis not present

## 2021-01-11 DIAGNOSIS — M4310 Spondylolisthesis, site unspecified: Secondary | ICD-10-CM

## 2021-01-11 MED ORDER — BENZONATATE 200 MG PO CAPS: 200.0000 mg | ORAL_CAPSULE | Freq: Two times a day (BID) | ORAL | 0 refills | Status: DC | PRN

## 2021-01-11 MED ORDER — DOXYCYCLINE HYCLATE 100 MG PO TABS: 100.0000 mg | ORAL_TABLET | Freq: Two times a day (BID) | ORAL | 0 refills | Status: DC

## 2021-01-11 NOTE — Patient Instructions (Addendum)
I will refer you to Dr. Charlann Boxer, DO for Osteopathic treatment.  They will call you to schedule.   Sacroiliac Joint Dysfunction  Sacroiliac joint dysfunction is a condition that causes inflammation on one or both sides of the sacroiliac (SI) joint. The SI joint is the joint between two bones of the pelvis called the sacrum and the ilium. The sacrum is the bone at the base of the spine. The ilium is the large bone that forms the hip. This condition causes deep aching or burning pain in the low back. In some cases, the pain may also spread into one or both buttocks, hips, or thighs. What are the causes? This condition may be caused by:  Pregnancy. During pregnancy, extra stress is put on the SI joints because the pelvis widens.  Injury, such as: ? Injuries from car crashes. ? Sports-related injuries. ? Work-related injuries.  Having one leg that is shorter than the other.  Conditions that affect the joints, such as: ? Rheumatoid arthritis. ? Gout. ? Psoriatic arthritis. ? Joint infection (septic arthritis). Sometimes, the cause of SI joint dysfunction is not known. What are the signs or symptoms? Symptoms of this condition include:  Aching or burning pain in the lower back. The pain may also spread to other areas, such as: ? Buttocks. ? Groin. ? Thighs.  Muscle spasms in or around the painful areas.  Increased pain when standing, walking, running, stair climbing, bending, or lifting. How is this diagnosed? This condition is diagnosed with a physical exam and your medical history. During the exam, the health care provider may move one or both of your legs to different positions to check for pain. Various tests may be done to confirm the diagnosis, including:  Imaging tests to look for other causes of pain. These may include: ? MRI. ? CT scan. ? Bone scan.  Diagnostic injection. A numbing medicine is injected into the SI joint using a needle. If your pain is temporarily  improved or stopped after the injection, this can indicate that SI joint dysfunction is the problem. How is this treated? Treatment depends on the cause and severity of your condition. Treatment options can be noninvasive and may include:  Ice or heat applied to the lower back area after an injury. This may help reduce pain and muscle spasms.  Medicines to relieve pain or inflammation or to relax the muscles.  Wearing a back brace (sacroiliac brace) to help support the joint while your back is healing.  Physical therapy to increase muscle strength around the joint and flexibility at the joint. This may also involve learning proper body positions and ways of moving to relieve stress on the joint.  Direct manipulation of the SI joint.  Use of a device that provides electrical stimulation to help reduce pain at the joint. Other treatments may include:  Injections of steroid medicine into the joint to reduce pain and swelling.  Radiofrequency ablation. This treatment uses heat to burn away nerves that are carrying pain messages from the joint.  Surgery to put in screws and plates that limit or prevent joint motion. This is rare. Follow these instructions at home: Medicines  Take over-the-counter and prescription medicines only as told by your health care provider.  Ask your health care provider if the medicine prescribed to you: ? Requires you to avoid driving or using machinery. ? Can cause constipation. You may need to take these actions to prevent or treat constipation:  Drink enough fluid to keep  your urine pale yellow.  Take over-the-counter or prescription medicines.  Eat foods that are high in fiber, such as beans, whole grains, and fresh fruits and vegetables.  Limit foods that are high in fat and processed sugars, such as fried or sweet foods. If you have a brace:  Wear the brace as told by your health care provider. Remove it only as told by your health care  provider.  Keep the brace clean.  If the brace is not waterproof: ? Do not let it get wet. ? Cover it with a watertight covering when you take a bath or a shower. Managing pain, stiffness, and swelling  Icing can help with pain and swelling. Heat may help with muscle tension or spasms. Ask your health care provider if you should use ice or heat.  If directed, put ice on the affected area: ? If you have a removable brace, remove it as told by your health care provider. ? Put ice in a plastic bag. ? Place a towel between your skin and the bag. ? Leave the ice on for 20 minutes, 2-3 times a day. ? Remove the ice if your skin turns bright red. This is very important. If you cannot feel pain, heat, or cold, you have a greater risk of damage to the area.  If directed, apply heat to the affected area as often as told by your health care provider. Use the heat source that your health care provider recommends, such as a moist heat pack or a heating pad. ? Place a towel between your skin and the heat source. ? Leave the heat on for 20-30 minutes. ? Remove the heat if your skin turns bright red. This is especially important if you are unable to feel pain, heat, or cold. You may have a greater risk of getting burned.      General instructions  Rest as needed. Return to your normal activities as told by your health care provider. Ask your health care provider what activities are safe for you.  Do exercises as told by your health care provider or physical therapist.  Keep all follow-up visits. This is important. Contact a health care provider if:  Your pain is not controlled with medicine.  You have a fever.  Your pain is getting worse. Get help right away if:  You have weakness, numbness, or tingling in your legs or feet.  You lose control of your bladder or bowels. Summary  Sacroiliac (SI) joint dysfunction is a condition that causes inflammation on one or both sides of the SI  joint.  This condition causes deep aching or burning pain in the low back. In some cases, the pain may also spread into one or both buttocks, hips, or thighs.  Treatment depends on the cause and severity of your condition. It may include medicines to reduce pain and swelling or to relax muscles. This information is not intended to replace advice given to you by your health care provider. Make sure you discuss any questions you have with your health care provider. Document Revised: 12/24/2019 Document Reviewed: 12/24/2019 Elsevier Patient Education  [place].    Start Mucinex DM. Start doxycyline every 12 hours for 10 days.   Tessalon perles also prescribed for cough.

## 2021-01-11 NOTE — Progress Notes (Signed)
This visit occurred during the SARS-CoV-2 public health emergency.  Safety protocols were in place, including screening questions prior to the visit, additional usage of staff PPE, and extensive cleaning of exam room while observing appropriate contact time as indicated for disinfecting solutions.    Christina [name] , 09/11/1940, 80 y.o., female MRN: [id number] Patient Care Team    Relationship Specialty Notifications Start End  Ma Hillock, DO PCP - General Family Medicine  09/02/17   Rolm Bookbinder, MD Consulting Physician General Surgery  10/05/16   Monna Fam, MD Consulting Physician Ophthalmology  10/05/16   Garry Heater, [place] Physician Dentistry  10/05/16   Juanita Craver, MD Consulting Physician Gastroenterology  09/02/17   Volanda Napoleon, MD Medical Oncologist Oncology  05/14/19   Cleon Gustin, MD Consulting Physician Urology  01/12/21   Suella Broad, MD Consulting Physician Physical Medicine and Rehabilitation  01/12/21   Lyndal Pulley, DO Consulting Physician Sports Medicine  01/12/21     Chief Complaint  Patient presents with  . Leg Pain    Pt c/o L leg pain starting in low back radiate to knee x 2 years; pt states that then pain has started causing n/v within the last month     Subjective: Pt presents for an OV with complaints of continued left lower back pain that radiates down her leg to her knee.  She states sometimes the pain wraps around high up on her thigh from her hip, and sometimes it radiates down towards her knee.  Pain has been persistent greater than 2 years with occasional exacerbation when she is out in her garden or increased activity.  She reports it has been more notable within the last month and she has had occasional sharp pains in her lower back that caused her to be nauseous.  She states that she stops what she is doing and rest the pain goes away.  She reports she will take a Tylenol if needed and she prefers not to be  prescribed medication for the pain.  She is wondering if there is something else that could be considered.  She had had injections by Dr. Herma Mering and she states the first injection went well and she did receive relief from her pain, later injections were not helpful.  She has also seen Dr. Lynne Leader in the past (when he was at [facility]), she does not desire to return. She is more interested in osteopathic manipulation therapy to help her with her symptoms.  Cough: Patient reports greater than 2 weeks history of a cough and sinus congestion.  She reports she has been battling the symptoms for greater than 2 weeks.  She reports she was COVID-negative.  She denies fever, chills, shortness of breath, decreased sense of taste or smell.  She has tried over-the-counter supportive therapy meds without much improvement.   MRI lumbar spine 03/01/2019: IMPRESSION: S1 is a transitional vertebra. L2-3 and L3-4: Mild disc bulges and facet hypertrophy.  No stenosis. L4-5: Facet osteoarthritis with 2 mm of anterolisthesis. Mild bulging of the disc. No compressive stenosis. L5-S1: Bulging of the disc. Facet osteoarthritis worse on the left. Left foraminal encroachment could possibly affect the left L5 nerve, though gross compression is not demonstrated. The facet arthropathy could certainly be a cause of low back pain or referred facet syndrome pain.  MRI left hip 02/23/2019 IMPRESSION: 1. Focal tear of the anterior aspect of the labrum of the left hip. 2. Arthritic changes of  the anterior and superolateral aspects of the left acetabulum with partial thickness cartilage loss.  Depression screen [facility] 2/9 12/28/2020 05/30/2020 01/14/2020 09/16/2019 06/05/2018  Decreased Interest 0 0 0 0 0  Down, Depressed, Hopeless 0 1 0 1 0  PHQ - 2 Score 0 1 0 1 0  Altered sleeping - 0 - - -  Tired, decreased energy - 1 - - -  Change in appetite - 0 - - -  Feeling bad or failure about yourself  - 0 - - -  Trouble  concentrating - 0 - - -  Moving slowly or fidgety/restless - 0 - - -  Suicidal thoughts - 0 - - -  PHQ-9 Score - 2 - - -  Some recent data might be hidden    Allergies  Allergen Reactions  . Nutrasweet Aspartame [Aspartame] Diarrhea  . Lisinopril Cough   Social History   Social History Narrative   Retired. Lives alone.    Attended some business college.    Former smoker.    Smoke alarm in the home. Wears seat balt.    Wears dentures.    Feels safe in her relationships.       Past Medical History:  Diagnosis Date  . Arthritis   . Asthma    a little?, no problems in several years  . Bladder cancer ([place])   . Bladder tumor   . Cervical cancer screening 01/31/2012  . Chicken pox as a child  . Degenerative tear of acetabular labrum of left hip 02/26/2019  . Depression with anxiety 06/07/2009   Qualifier: Diagnosis of  By: [name] MD, [name]    . Dermatitis of external ear 07/11/2012  . Diverticulosis   . Ganglion cyst 02/26/2019   Right elbow  . Hiatal hernia with gastroesophageal reflux 10/26/2010   Qualifier: Diagnosis of  By: [name] MD, [name]    . Hyperglycemia 06/21/2013  . Hyperlipidemia, mixed 10/16/2015   pt unaware  . Hypertension   . Left hip pain 07/10/2015  . Lesion of breast 12/03/2016   benign; resolved  . Low back pain 05/29/2015  . Measles as a child  . Mumps as a child  . Osteopenia 01/03/2017  . Overweight (BMI 25.0-29.9) 10/07/2008   Qualifier: Diagnosis of  By: [name] MD, [name]    . Palpitations    several years ago, not currently  . Pre-diabetes   . Psoriasis    ears  . RUQ pain 05/29/2015  . Spider veins    Bilateral legs  . Spinal stenosis   . Tailor's bunionette, left 09/02/2017  . Umbilical hernia   . Urinary frequency 09/28/2011  . Vertigo   . Wears dentures    Upper,   Past Surgical History:  Procedure Laterality Date  . ABDOMINAL SURGERY  1970's  . BREAST BIOPSY Right 1980   BREAST EXCISIONAL BIOPSY  . BREAST BIOPSY Left  1970   BREAST EXCISIONAL BIOPSY  . BREAST CYST ASPIRATION    . CATARACT EXTRACTION, BILATERAL    . COLONOSCOPY    . CYSTECTOMY     abdomen  . CYSTOSCOPY W/ RETROGRADES Bilateral 04/27/2019   Procedure: CYSTOSCOPY WITH RETROGRADE PYELOGRAM;  Surgeon: Cleon Gustin, MD;  Location[facility];  Service: Urology;  Laterality: Bilateral;  . EYE SURGERY Bilateral    cataract  . INSERTION OF MESH N/A 06/11/2017   Procedure: INSERTION OF MESH;  Surgeon: Rolm Bookbinder, MD;  Location: [place];  Service: General;  Laterality: N/A;  BILATERAL TAP  BLOCK  . IR REMOVAL TUN ACCESS W/ PORT W/O FL MOD SED  11/09/2020  . TRANSURETHRAL RESECTION OF BLADDER TUMOR N/A 04/27/2019   Procedure: TRANSURETHRAL RESECTION OF BLADDER TUMOR (TURBT);  Surgeon: Cleon Gustin, MD;  Location[facility];  Service: Urology;  Laterality: N/A;  1 HR  . TRANSURETHRAL RESECTION OF BLADDER TUMOR N/A 06/01/2019   Procedure: TRANSURETHRAL RESECTION OF BLADDER TUMOR (TURBT);  Surgeon: Cleon Gustin, MD;  Location: [facility];  Service: Urology;  Laterality: N/A;  1 HR  . TRANSURETHRAL RESECTION OF BLADDER TUMOR N/A 10/22/2019   Procedure: TRANSURETHRAL RESECTION OF BLADDER TUMOR (TURBT);  Surgeon: Cleon Gustin, MD;  Location[facility];  Service: Urology;  Laterality: N/A;  . TUBAL LIGATION  1970  . VENTRAL HERNIA REPAIR N/A 06/11/2017   Procedure: LAPAROSCOPIC VENTRAL HERNIA REPAIR WITH MESH ERAS PATHWAY;  Surgeon: Rolm Bookbinder, MD;  Location: [place];  Service: General;  Laterality: N/A;  BILATERAL TAP BLOCK   Family History  Problem Relation Age of Onset  . Hypertension Mother   . Anxiety disorder Mother   . Ovarian cancer Mother 34       lung- smoker, ovarian  . Lung cancer Mother        smoker  . Arthritis Mother   . Hyperlipidemia Mother   . Alcohol abuse Father   . Diabetes Sister   . Hypertension Sister   . Ovarian cancer  Sister   . Arthritis Sister   . Depression Sister   . Hyperlipidemia Sister   . Other Maternal Grandmother        pacemaker  . Multiple sclerosis Maternal Grandfather        ?  [name] Arthritis Brother   . COPD Brother   . Heart attack Brother   . Hyperlipidemia Brother   . Stroke Brother   . Asthma Sister   . Depression Sister   . Hyperlipidemia Sister   . Breast cancer Sister   . Asthma Sister   . Depression Sister   . Asthma Paternal Uncle    Allergies as of 01/11/2021      Reactions   Nutrasweet Aspartame [aspartame] Diarrhea   Lisinopril Cough      Medication List       Accurate as of Jan 11, 2021 11:59 PM. If you have any questions, ask your nurse or doctor.        STOP taking these medications   First-Mouthwash BLM Susp Stopped by: Howard Pouch, DO   lactulose 10 GM/15ML solution Commonly known as: CHRONULAC Stopped by: Howard Pouch, DO     TAKE these medications   benzonatate 200 MG capsule Commonly known as: TESSALON Take 1 capsule (200 mg total) by mouth 2 (two) times daily as needed for cough. Started by: Howard Pouch, DO   doxycycline 100 MG tablet Commonly known as: VIBRA-TABS Take 1 tablet (100 mg total) by mouth 2 (two) times daily. Started by: Howard Pouch, DO   famotidine 20 MG tablet Commonly known as: PEPCID Take by mouth.   LORazepam 0.5 MG tablet Commonly known as: ATIVAN Take 1 tablet (0.5 mg total) by mouth 2 (two) times daily as needed for anxiety.   magnesium oxide 400 MG tablet Commonly known as: MAG-OX Take by mouth.   PARoxetine 20 MG tablet Commonly known as: Paxil Take 1 tablet (20 mg total) by mouth daily.   potassium chloride SA 20 MEQ tablet Commonly known as: KLOR-CON Take 2 tablets (  40 mEq total) by mouth daily.       All past medical history, surgical history, allergies, family history, immunizations andmedications were updated in the EMR today and reviewed under the history and medication portions of their  EMR.     ROS: Negative, with the exception of above mentioned in HPI   Objective:  BP 113/64   Pulse 74   Temp 98.3 F (36.8 C) (Oral)   Ht 5\' 5"  (1.651 m)   Wt 155 lb (70.3 kg)   SpO2 99%   BMI 25.79 kg/m  Body mass index is 25.79 kg/m. Gen: Afebrile. No acute distress. Nontoxic in appearance, well developed, well nourished.  HENT: AT. [place]. Bilateral TM visualized without erythema. MMM, no oral lesions. Bilateral nares with erythema, drainage and swelling. Throat without erythema or exudates.  Bronchial cough present.  No hoarseness Eyes:Pupils Equal Round Reactive to light, Extraocular movements intact,  Conjunctiva without redness, discharge or icterus. Neck/lymp/endocrine: Supple, no lymphadenopathy CV: RRR, no edema Chest: CTAB, no wheeze or crackles. Good air movement, normal resp effort.  MSK:  Lumbar spine: No erythema, no soft tissue swelling.  No bony tenderness over lumbar spine.  Tender to palpation left SI joint.  Right paraspinal lumbar muscles with spasms.  Guarded gait, especially with step up and down off of table step.  Mild decrease in muscle strength 4+/5 left hip flexion.  DTRs equal bilaterally Skin: No rashes, purpura or petechiae.  Neuro: Guarded gait with stepping up and down off step.  Psych: Normal affect, dress and demeanor. Normal speech. Normal thought content and judgment.  No exam data present No results found. No results found for this or any previous visit (from the past 24 hour(s)).  Assessment/Plan: KEONNA FROMAN is a 80 y.o. female present for OV for  Acute bronchitis with symptoms > 10 days Rest, hydrate.  +/- flonase, mucinex (DM if cough), nettie pot or nasal saline.  Doxycycline prescribed, take until completed.  Tessalon Perles for cough If cough present it can last up to 6-8 weeks.  F/U 2 weeks if not improved.    SI (sacroiliac) joint dysfunction/lumbar radiculopathy/Osteoarthritis of facet joint at L5-S1 level of lumbosacral  spine/ Anterolisthesis Lengthy discussion today review of prior MRI results from 2020 both her hip and lumbar spine.  She reports Ortho told her her hip was not an issue.  She did have injections by Dr. Nelva Bush, but after the first injection she did not find much benefit. She is not interested in medications to help control her pain.  She states is not that bad.   -she would like to proceed with referral to sports med for OMT. - Ambulatory referral to Sports Medicine  Degenerative tear of acetabular labrum of left hip Some of her unsteadiness and pain I believe is coming from her hip.  However the majority of her discomfort today seems to be surrounding her left SI joint. - Ambulatory referral to Sports Medicine   Reviewed expectations re: course of current medical issues.  Discussed self-management of symptoms.  Outlined signs and symptoms indicating need for more acute intervention.  Patient verbalized understanding and all questions were answered.  Patient received an After-Visit Summary.    Orders Placed This Encounter  Procedures  . Ambulatory referral to Sports Medicine   Meds ordered this encounter  Medications  . doxycycline (VIBRA-TABS) 100 MG tablet    Sig: Take 1 tablet (100 mg total) by mouth 2 (two) times daily.    Dispense:  20 tablet    Refill:  0  . benzonatate (TESSALON) 200 MG capsule    Sig: Take 1 capsule (200 mg total) by mouth 2 (two) times daily as needed for cough.    Dispense:  20 capsule    Refill:  0    Referral Orders     Ambulatory referral to Sports Medicine   Note is dictated utilizing voice recognition software. Although note has been proof read prior to signing, occasional typographical errors still can be missed. If any questions arise, please do not hesitate to call for verification.   electronically signed by:  Howard Pouch, DO  [place]

## 2021-01-12 DIAGNOSIS — M4310 Spondylolisthesis, site unspecified: Secondary | ICD-10-CM | POA: Insufficient documentation

## 2021-01-12 DIAGNOSIS — M5416 Radiculopathy, lumbar region: Secondary | ICD-10-CM | POA: Insufficient documentation

## 2021-01-12 DIAGNOSIS — M47817 Spondylosis without myelopathy or radiculopathy, lumbosacral region: Secondary | ICD-10-CM | POA: Insufficient documentation

## 2021-02-07 DIAGNOSIS — C678 Malignant neoplasm of overlapping sites of bladder: Secondary | ICD-10-CM | POA: Diagnosis not present

## 2021-02-15 ENCOUNTER — Other Ambulatory Visit: Payer: Self-pay

## 2021-02-15 ENCOUNTER — Encounter: Payer: Self-pay | Admitting: Family Medicine

## 2021-02-15 ENCOUNTER — Ambulatory Visit (INDEPENDENT_AMBULATORY_CARE_PROVIDER_SITE_OTHER): Payer: Medicare PPO | Admitting: Family Medicine

## 2021-02-15 DIAGNOSIS — M24152 Other articular cartilage disorders, left hip: Secondary | ICD-10-CM | POA: Diagnosis not present

## 2021-02-15 DIAGNOSIS — M47817 Spondylosis without myelopathy or radiculopathy, lumbosacral region: Secondary | ICD-10-CM

## 2021-02-15 NOTE — Patient Instructions (Signed)
Vit D 2000IU daily Tart Cherry Extract 1200mg  at night See me again in 6-8 weeks

## 2021-02-15 NOTE — Progress Notes (Signed)
[place] Phone: [phone] Subjective:   Fontaine No, am serving as a scribe for Dr. Hulan Saas. This visit occurred during the SARS-CoV-2 public health emergency.  Safety protocols were in place, including screening questions prior to the visit, additional usage of staff PPE, and extensive cleaning of exam room while observing appropriate contact time as indicated for disinfecting solutions.   I'm seeing this patient by the request  of:  Kuneff, Renee A, DO  CC: ip pain  [id number]  Christina [name] is a 80 y.o. female coming in with complaint of hip  pain. Patient states that 2 years ago she was carrying Christmas tree up stairs and felt pull in L groin. Was told it was her back that was causing pain. Saw Dr. Nelva Bush and got 5 injections.  We do not have the notes to know what injections.  Last 3 injections did not help with her pain with the last one on February 9th, 2022. Pain is increasing with walking and working outside. Patient will have burning sensation after activity which will make her vomit.  Patient only had this 1 time when she was in the yard.  Uses Tylenol and heat for pain. Will have pain that travels through quad into knee.  Never past the knee.  Patient denies it is happening again and denies any bowel or bladder incontinence recently.  Past medical history is positive for bladder cancer and patient is concerned.  Saw Dr. Georgina Snell for L hip pain in 2020.   MRI L hip 2020 IMPRESSION: 1. Focal tear of the anterior aspect of the labrum of the left hip. 2. Arthritic changes of the anterior and superolateral aspects of the left acetabulum with partial thickness cartilage loss.    MRI lumbar 2020 IMPRESSION: S1 is a transitional vertebra.   L2-3 and L3-4: Mild disc bulges and facet hypertrophy.  No stenosis.   L4-5: Facet osteoarthritis with 2 mm of anterolisthesis. Mild bulging of the disc.  No compressive stenosis.   L5-S1: Bulging of the disc. Facet osteoarthritis worse on the left. Left foraminal encroachment could possibly affect the left L5 nerve, though gross compression is not demonstrated. The facet arthropathy could certainly be a cause of low back pain or referred facet syndrome pain.  Reviewed patient's most recent CT chest abdomen and pelvis that was on December 16, 2018 musculoskeletal findings show that patient does have fairly significant degenerative disc disease of the lower lumbar spine.  Do not see anything except moderate arthritic changes of the hip joint  Patient also had a PET scan in March 2021 that did not show any metastatic disease.  Past Medical History:  Diagnosis Date   Arthritis    Asthma    a little?, no problems in several years   Bladder cancer [facility])    Bladder tumor    Cervical cancer screening 01/31/2012   Chicken pox as a child   Degenerative tear of acetabular labrum of left hip 02/26/2019   Depression with anxiety 06/07/2009   Qualifier: Diagnosis of  By: [name] MD, Catherine     Dermatitis of external ear 07/11/2012   Diverticulosis    Ganglion cyst 02/26/2019   Right elbow   Hiatal hernia with gastroesophageal reflux 10/26/2010   Qualifier: Diagnosis of  By: [name] MD, Catherine     Hyperglycemia 06/21/2013   Hyperlipidemia, mixed 10/16/2015   pt unaware   Hypertension    Left hip  pain 07/10/2015   Lesion of breast 12/03/2016   benign; resolved   Low back pain 05/29/2015   Measles as a child   Mumps as a child   Osteopenia 01/03/2017   Overweight (BMI 25.0-29.9) 10/07/2008   Qualifier: Diagnosis of  By: [name] MD, [name]     Palpitations    several years ago, not currently   Pre-diabetes    Psoriasis    ears   RUQ pain 05/29/2015   Spider veins    Bilateral legs   Spinal stenosis    Tailor's bunionette, left 03/28/4234   Umbilical hernia    Urinary frequency 09/28/2011   Vertigo    Wears dentures    Upper,   Past  Surgical History:  Procedure Laterality Date   ABDOMINAL SURGERY  1970's   BREAST BIOPSY Right 1980   BREAST EXCISIONAL BIOPSY   BREAST BIOPSY Left 1970   BREAST EXCISIONAL BIOPSY   BREAST CYST ASPIRATION     CATARACT EXTRACTION, BILATERAL     COLONOSCOPY     CYSTECTOMY     abdomen   CYSTOSCOPY W/ RETROGRADES Bilateral 04/27/2019   Procedure: CYSTOSCOPY WITH RETROGRADE PYELOGRAM;  Surgeon: Cleon Gustin, MD;  Location: [facility];  Service: Urology;  Laterality: Bilateral;   EYE SURGERY Bilateral    cataract   INSERTION OF MESH N/A 06/11/2017   Procedure: INSERTION OF MESH;  Surgeon: Rolm Bookbinder, MD;  Location: [facility];  Service: General;  Laterality: N/A;  BILATERAL TAP BLOCK   IR REMOVAL TUN ACCESS W/ PORT W/O FL MOD SED  11/09/2020   TRANSURETHRAL RESECTION OF BLADDER TUMOR N/A 04/27/2019   Procedure: TRANSURETHRAL RESECTION OF BLADDER TUMOR (TURBT);  Surgeon: Cleon Gustin, MD;  Location: [facility];  Service: Urology;  Laterality: N/A;  1 HR   TRANSURETHRAL RESECTION OF BLADDER TUMOR N/A 06/01/2019   Procedure: TRANSURETHRAL RESECTION OF BLADDER TUMOR (TURBT);  Surgeon: Cleon Gustin, MD;  Location[facility];  Service: Urology;  Laterality: N/A;  1 HR   TRANSURETHRAL RESECTION OF BLADDER TUMOR N/A 10/22/2019   Procedure: TRANSURETHRAL RESECTION OF BLADDER TUMOR (TURBT);  Surgeon: Cleon Gustin, MD;  Location[facility];  Service: Urology;  Laterality: N/A;   TUBAL LIGATION  1970   VENTRAL HERNIA REPAIR N/A 06/11/2017   Procedure: LAPAROSCOPIC VENTRAL HERNIA REPAIR WITH MESH ERAS PATHWAY;  Surgeon: Rolm Bookbinder, MD;  Location: [place];  Service: General;  Laterality: N/A;  BILATERAL TAP BLOCK   Social History   Socioeconomic History   Marital status: Widowed    Spouse name: Not on file   Number of children: 2   Years of education: Not on file   Highest education level: Not on file   Occupational History   Occupation: retired  Tobacco Use   Smoking status: Former    Packs/day: 1.00    Years: 30.00    Pack years: 30.00    Types: Cigarettes    Quit date: 08/27/1990    Years since quitting: 30.4   Smokeless tobacco: Never  Vaping Use   Vaping Use: Never used  Substance and Sexual Activity   Alcohol use: No   Drug use: No   Sexual activity: Not Currently    Birth control/protection: Post-menopausal    Comment: lives alone, no dietary restrictions  Other Topics Concern   Not on file  Social History Narrative   Retired. Lives alone.    Attended some business college.  Former smoker.    Smoke alarm in the home. Wears seat balt.    Wears dentures.    Feels safe in her relationships.       Social Determinants of Health   Financial Resource Strain: Low Risk    Difficulty of Paying Living Expenses: Not hard at all  Food Insecurity: No Food Insecurity   Worried About Charity fundraiser in the Last Year: Never true   [place] in the Last Year: Never true  Transportation Needs: No Transportation Needs   Lack of Transportation (Medical): No   Lack of Transportation (Non-Medical): No  Physical Activity: Insufficiently Active   Days of Exercise per Week: 2 days   Minutes of Exercise per Session: 50 min  Stress: No Stress Concern Present   Feeling of Stress : Not at all  Social Connections: Moderately Integrated   Frequency of Communication with Friends and Family: More than three times a week   Frequency of Social Gatherings with Friends and Family: More than three times a week   Attends Religious Services: More than 4 times per year   Active Member of [employer] or Organizations: Yes   Attends Archivist Meetings: More than 4 times per year   Marital Status: Widowed   Allergies  Allergen Reactions   Nutrasweet Aspartame [Aspartame] Diarrhea   Lisinopril Cough   Family History  Problem Relation Age of Onset   Hypertension Mother     Anxiety disorder Mother    Ovarian cancer Mother 37       lung- smoker, ovarian   Lung cancer Mother        smoker   Arthritis Mother    Hyperlipidemia Mother    Alcohol abuse Father    Diabetes Sister    Hypertension Sister    Ovarian cancer Sister    Arthritis Sister    Depression Sister    Hyperlipidemia Sister    Other Maternal Grandmother        pacemaker   Multiple sclerosis Maternal Grandfather        ?   Arthritis Brother    COPD Brother    Heart attack Brother    Hyperlipidemia Brother    Stroke Brother    Asthma Sister    Depression Sister    Hyperlipidemia Sister    Breast cancer Sister    Asthma Sister    Depression Sister    Asthma Paternal Uncle         Current Outpatient Medications (Respiratory):    benzonatate (TESSALON) 200 MG capsule, Take 1 capsule (200 mg total) by mouth 2 (two) times daily as needed for cough.       Current Outpatient Medications (Other):    doxycycline (VIBRA-TABS) 100 MG tablet, Take 1 tablet (100 mg total) by mouth 2 (two) times daily.   LORazepam (ATIVAN) 0.5 MG tablet, Take 1 tablet (0.5 mg total) by mouth 2 (two) times daily as needed for anxiety.   magnesium oxide (MAG-OX) 400 MG tablet, Take by mouth.   PARoxetine (PAXIL) 20 MG tablet, Take 1 tablet (20 mg total) by mouth daily.   potassium chloride SA (KLOR-CON) 20 MEQ tablet, Take 2 tablets (40 mEq total) by mouth daily.   Facility-Administered Medications Ordered in Other Visits (Other):    sodium chloride flush (NS) 0.9 % injection 10 mL No current facility-administered medications for this visit.   Reviewed prior external information including notes and imaging from  primary care provider As well  as notes that were available from care everywhere and other healthcare systems.  Past medical history, social, surgical and family history all reviewed in electronic medical record.  No pertanent information unless stated regarding to the chief complaint.    Review of Systems:  No headache, visual changes,, , diarrhea, constipation, dizziness, abdominal pain, skin , fevers, chills, night sweats, weight loss, swollen lymph nodes,  joint swelling, chest pain, shortness of breath, mood changes. POSITIVE muscle aches, intermittent body aches 1 episode of nausea and vomiting with pain  Objective  Blood pressure 110/70, pulse 78, height 5\' 5"  (1.651 m), weight 157 lb (71.2 kg), SpO2 96 %.   General: No apparent distress alert and oriented x3 mood and affect normal, dressed appropriately.  HEENT: Pupils equal, extraocular movements intact  Respiratory: Patient's speak in full sentences and does not appear short of breath  Cardiovascular: No lower extremity edema, non tender, no erythema  Gait very mildly antalgic Left hip exam though does not show any significant gross abnormality.  Patient has decent range of motion with some mild decrease in internal rotation.  Patient does have some mild pain with flexion noted of the knee against resistance patient has mild positive straight leg test still with tightness of the hamstring and 35 degrees of forward flexion.  Neurovascularly intact distally.  Patient does have tenderness to palpation in the paraspinal musculature of the lumbar spine as well.  [id number]; 15 additional minutes spent for Therapeutic exercises as stated in above notes.  This included exercises focusing on stretching, strengthening, with significant focus on eccentric aspects.   Long term goals include an improvement in range of motion, strength, endurance as well as avoiding reinjury. Patient's frequency would include in 1-2 times a day, 3-5 times a week for a duration of 6-12 weeks.  Hip strengthening exercises which included:  Pelvic tilt/bracing to help with proper recruitment of the lower abs and pelvic floor muscles  Glute strengthening to properly contract glutes without over-engaging low back and hamstrings - prone hip extension and glute  bridge exercises Proper stretching techniques to increase effectiveness for the hip flexors, groin, quads, piriformic and low back when appropriate  Proper technique shown and discussed handout in great detail with ATC.  All questions were discussed and answered.     Impression and Recommendations:     The above documentation has been reviewed and is accurate and complete Lyndal Pulley, DO

## 2021-02-15 NOTE — Assessment & Plan Note (Signed)
Patient has known arthritic changes of the back noted with facet arthropathy.  No longer responding to what it sounds to be facet injections from an outside facility but will get labs.  Patient has had an elevated calcium level previously and with a past medical history significant for cancer somewhat of a concern.  Patient is being followed by oncology and has a repeat CT scan coming up in the next month.  We will monitor as well.  Discussed with patient that the potential other injections could be beneficial but will start with home exercises and over-the-counter medications including vitamin D supplementation.  Discussed the potential for osteopathic manipulation but when we did discuss the possibility with patient as bone health she would like to hold on that at the moment but we will consider it again at follow-up.  Follow-up with me again in 6 to 8 weeks

## 2021-02-16 ENCOUNTER — Encounter: Payer: Self-pay | Admitting: Family Medicine

## 2021-02-16 ENCOUNTER — Other Ambulatory Visit: Payer: Self-pay

## 2021-02-16 DIAGNOSIS — M25552 Pain in left hip: Secondary | ICD-10-CM

## 2021-02-16 DIAGNOSIS — M545 Low back pain, unspecified: Secondary | ICD-10-CM

## 2021-02-16 NOTE — Assessment & Plan Note (Signed)
Patient is having the left hip pain.  Concerned that patient may be having worsening of the degenerative tear of the acetabular labrum.  In addition of this though there is still a good chance that this could be more secondary to lumbar radiculopathy.  Patient has had chemotherapy recently as well as does have a bladder cancer.  We will get repeat imaging to make sure there is no bony abnormalities.  Patient is highly anxious about this and may consider the possibility as well of advanced imaging especially if pain seems to worsen.  We will attempt to get notes from outside facility first so we are not repeating anything.  In addition to this patient would like to avoid significant number of medications and we discussed over-the-counter and natural supplementations that I think will be helpful.  Patient given home exercises and we discussed the potential for physical therapy but patient would like to do the home exercises first follow-up with me again in 4 to 8 weeks.  We did discuss the possibility of osteopathic manipulation which patient declined but we will keep as an option in future office appointments.

## 2021-02-20 ENCOUNTER — Telehealth: Payer: Medicare PPO | Admitting: Family Medicine

## 2021-02-20 ENCOUNTER — Telehealth: Payer: Self-pay | Admitting: Family Medicine

## 2021-02-20 DIAGNOSIS — U071 COVID-19: Secondary | ICD-10-CM | POA: Diagnosis not present

## 2021-02-20 NOTE — Telephone Encounter (Signed)
Spoke with pt and informed her that she will need an VV. Pt aware she must have camera and mic access.   Please assist pt in the next avail slot

## 2021-02-20 NOTE — Telephone Encounter (Signed)
Pt called and said that she has had a cough for over 4 weeks and runny nose. She wanted to know if she needs appt or what she should take or do. She said she thinks it might be allergies. She said she mentioned the cough last time she was in here and Dr Raoul Pitch gave her little yellow pills for it but it didn't help

## 2021-02-24 DIAGNOSIS — K9409 Other complications of colostomy: Secondary | ICD-10-CM | POA: Diagnosis not present

## 2021-02-24 DIAGNOSIS — C7911 Secondary malignant neoplasm of bladder: Secondary | ICD-10-CM | POA: Diagnosis not present

## 2021-02-24 DIAGNOSIS — Z936 Other artificial openings of urinary tract status: Secondary | ICD-10-CM | POA: Diagnosis not present

## 2021-02-28 IMAGING — MG DIGITAL SCREENING BILAT W/ TOMO W/ CAD
8 series · 8 of 24 positions shown · non-contrast
Comparison: Previous exam(s).

CLINICAL DATA: Screening.

EXAM:
DIGITAL SCREENING BILATERAL MAMMOGRAM WITH TOMO AND CAD

[R CC synth-2D]
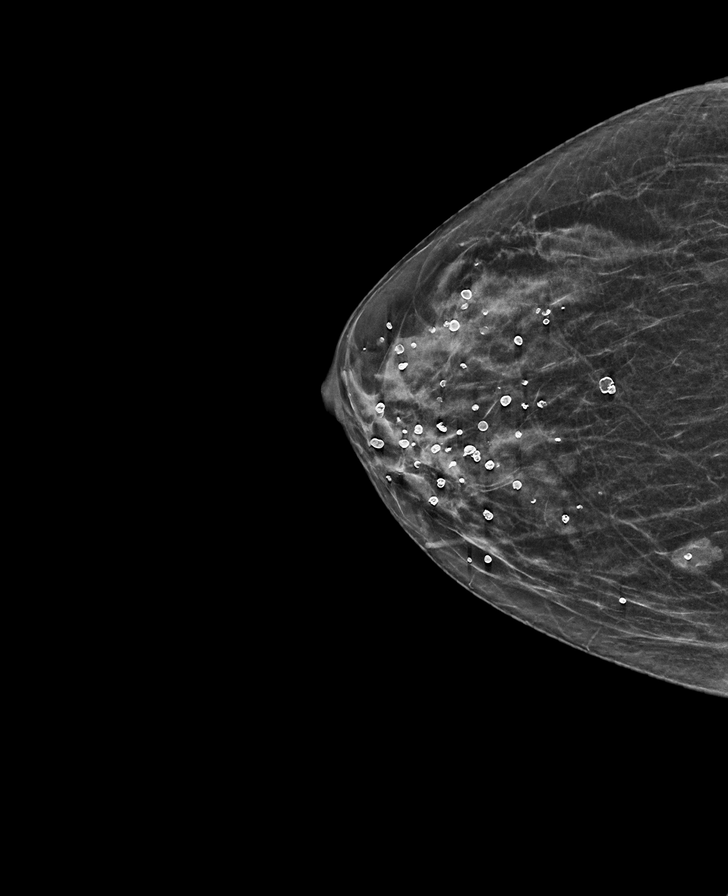

[R MLO synth-2D]
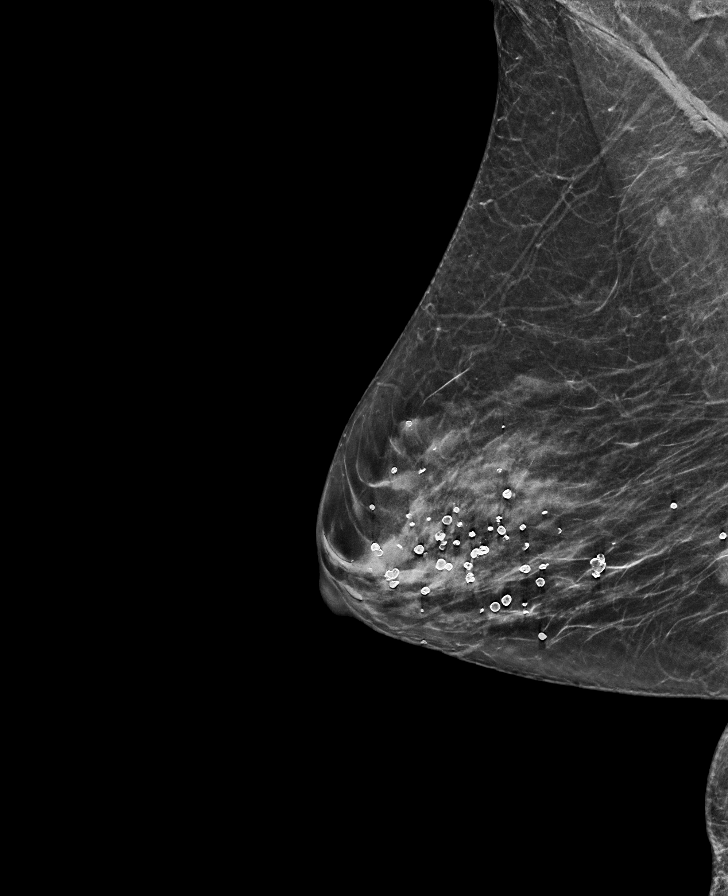

[L MLO synth-2D]
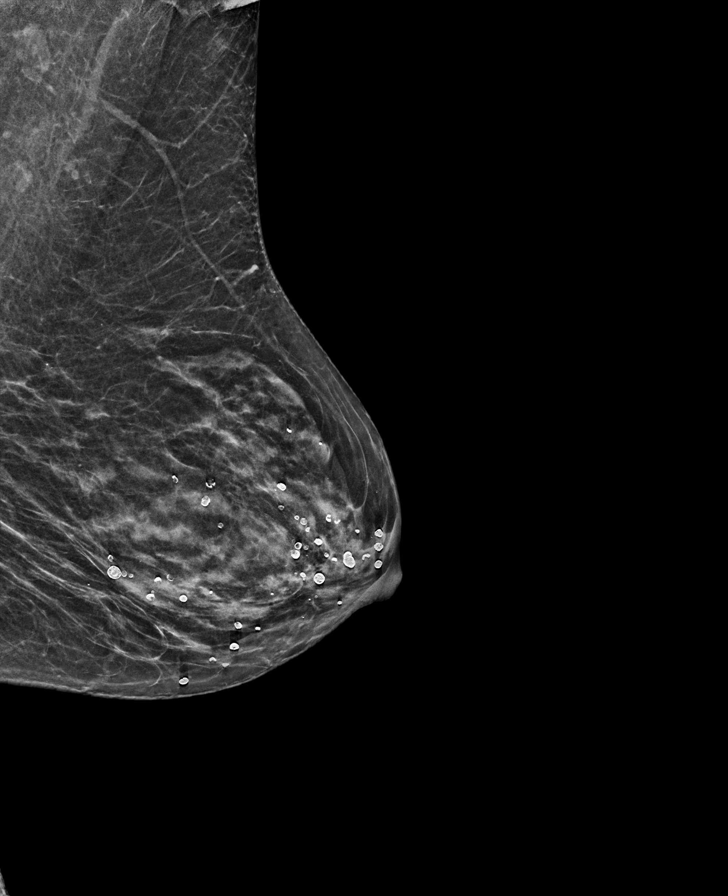

[L CC synth-2D]
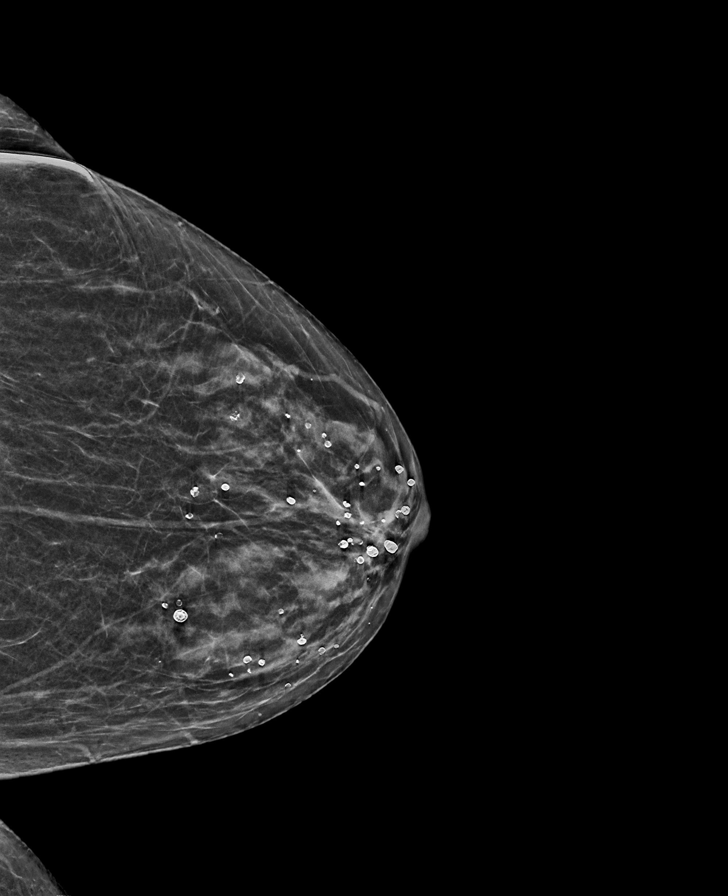

[L MLO tomo · tomo slice 25/50.0]
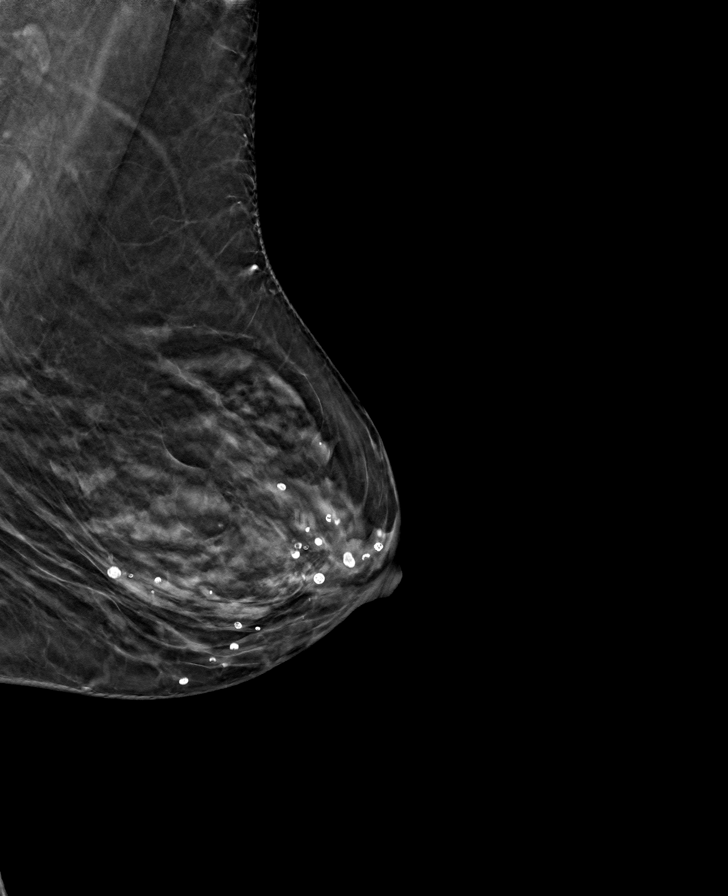

[L CC tomo · tomo slice 25/49.0]
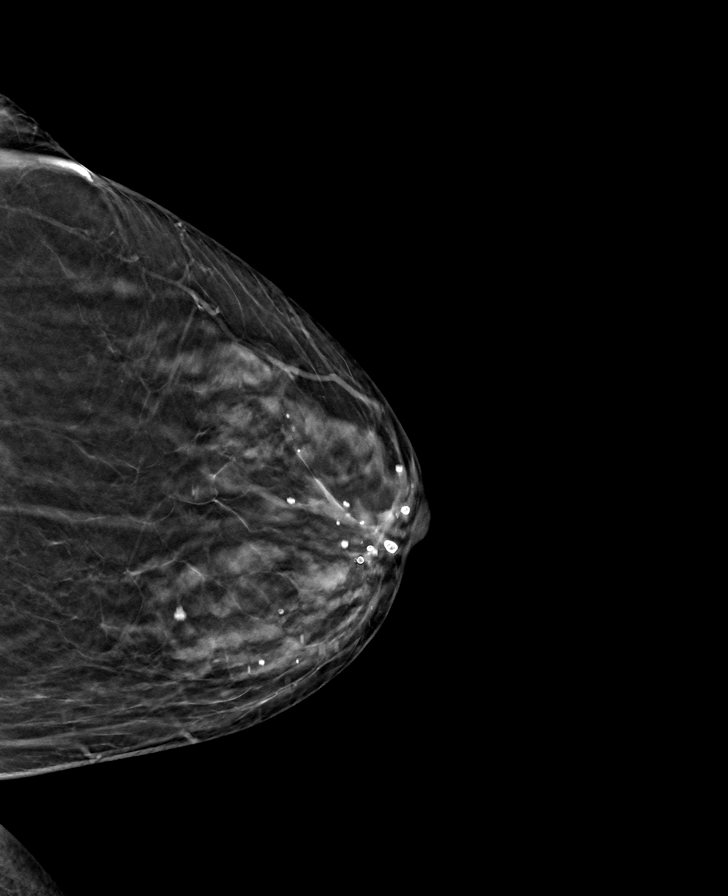

[R CC tomo · tomo slice 26/51.0]
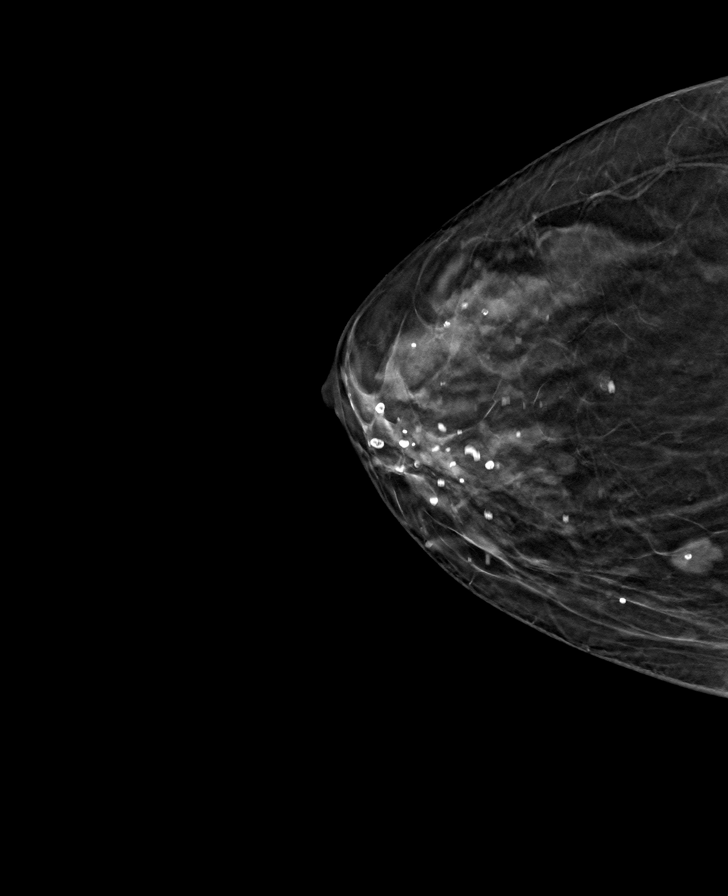

[R MLO tomo · tomo slice 27/54.0]
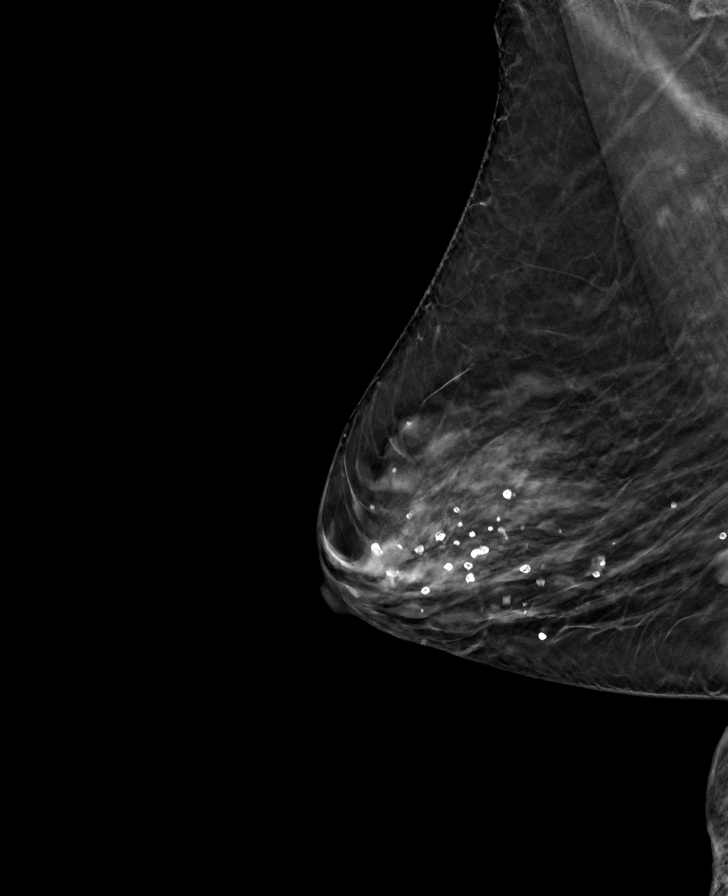

[8 of 24 positions shown; findings below may reference images not displayed]

ACR Breast Density Category c: The breast tissue is heterogeneously
dense, which may obscure small masses.
FINDINGS: There are no findings suspicious for malignancy. Images were
processed with CAD.
IMPRESSION: No mammographic evidence of malignancy. A result letter of this
screening mammogram will be mailed directly to the patient.

RECOMMENDATION:
Screening mammogram in one year. (Code:[id number]-[id number])

BI-RADS CATEGORY  1: Negative.

## 2021-03-07 ENCOUNTER — Ambulatory Visit (INDEPENDENT_AMBULATORY_CARE_PROVIDER_SITE_OTHER): Payer: Medicare PPO

## 2021-03-07 DIAGNOSIS — M545 Low back pain, unspecified: Secondary | ICD-10-CM | POA: Diagnosis not present

## 2021-03-07 DIAGNOSIS — M25552 Pain in left hip: Secondary | ICD-10-CM

## 2021-03-11 IMAGING — CT NM PET TUM IMG RESTAG (PS) SKULL BASE T - THIGH
1 of 7 series · 1 of 25 positions shown · non-contrast
Comparison: PET-CT 05/27/2019.  Abdominopelvic CT 05/06/2017

CLINICAL DATA: Subsequent treatment strategy for invasive bladder
cancer.

EXAM:
NUCLEAR MEDICINE PET SKULL BASE TO THIGH
TECHNIQUE: 8.0 mCi F-18 FDG was injected intravenously. Full-ring PET imaging
was performed from the skull base to thigh after the radiotracer. CT
data was obtained and used for attenuation correction and anatomic
localization.
Fasting blood glucose: 115 mg/dl

[Series 4: ct sk_thigh 5.0 b31f · axial · 5.0mm · 0.96mm/px · 1 of 218 slices shown]
[im 218/218  brain]
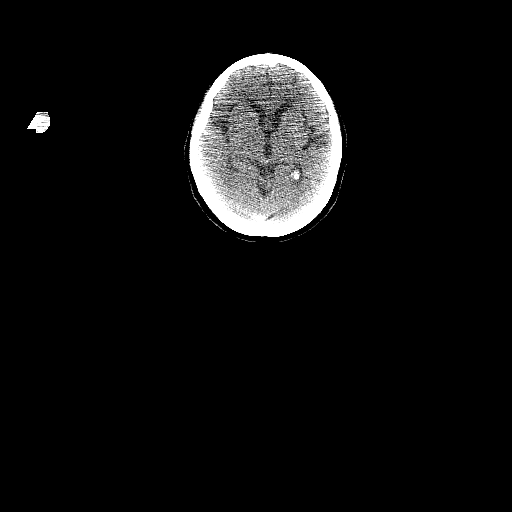

[1 of 25 positions shown; findings below may reference images not displayed]

FINDINGS: Mediastinal blood pool activity: SUV max

NECK:

No hypermetabolic cervical lymph nodes are identified.There are no
lesions of the pharyngeal mucosal space.

Incidental CT findings: Left carotid atherosclerosis.

CHEST:

There are no hypermetabolic mediastinal, hilar or axillary lymph
nodes. No suspicious pulmonary activity or nodularity.

Incidental CT findings: Atherosclerosis of the aorta, great vessels
and coronary arteries. Grossly stable bilateral breast
calcifications.

ABDOMEN/PELVIS:

There is no hypermetabolic activity within the liver, adrenal
glands, spleen or pancreas. There is no hypermetabolic nodal
activity.

Incidental CT findings: Stable 1.8 cm low-density right adrenal
nodule (image 109/4), consistent with an incidental adenoma. Mild
aortic and branch vessel atherosclerosis. Bilateral bladder
diverticula, larger on the left. Stable small umbilical hernia
containing only fat.

SKELETON:

There is no hypermetabolic activity to suggest osseous metastatic
disease.

Incidental CT findings: none
IMPRESSION: 1. Stable PET-CT.  No evidence of metastatic disease.
2. Grossly stable appearance of the bladder with trabeculation and
diverticula. No hydronephrosis.
3. Stable incidental findings including a small right adrenal
adenoma and aortic atherosclerosis ([id number]).

## 2021-03-16 DIAGNOSIS — K9409 Other complications of colostomy: Secondary | ICD-10-CM | POA: Diagnosis not present

## 2021-03-16 DIAGNOSIS — Z936 Other artificial openings of urinary tract status: Secondary | ICD-10-CM | POA: Diagnosis not present

## 2021-03-16 DIAGNOSIS — C7911 Secondary malignant neoplasm of bladder: Secondary | ICD-10-CM | POA: Diagnosis not present

## 2021-03-24 ENCOUNTER — Other Ambulatory Visit: Payer: Self-pay

## 2021-03-24 ENCOUNTER — Ambulatory Visit (HOSPITAL_BASED_OUTPATIENT_CLINIC_OR_DEPARTMENT_OTHER)
Admission: RE | Admit: 2021-03-24 | Discharge: 2021-03-24 | Disposition: A | Discharge status: Home / Self Care | Payer: Medicare PPO | Source: Ambulatory Visit | Attending: Hematology & Oncology | Admitting: Hematology & Oncology

## 2021-03-24 ENCOUNTER — Inpatient Hospital Stay: Payer: Medicare PPO | Admitting: Hematology & Oncology

## 2021-03-24 ENCOUNTER — Encounter: Payer: Self-pay | Admitting: Hematology & Oncology

## 2021-03-24 ENCOUNTER — Telehealth: Payer: Self-pay | Admitting: *Deleted

## 2021-03-24 ENCOUNTER — Inpatient Hospital Stay: Payer: Medicare PPO | Attending: Hematology & Oncology

## 2021-03-24 VITALS — BP 139/65 | HR 71 | Temp 98.2°F | Resp 20 | Wt 159.1 lb

## 2021-03-24 DIAGNOSIS — C671 Malignant neoplasm of dome of bladder: Secondary | ICD-10-CM

## 2021-03-24 DIAGNOSIS — Z79899 Other long term (current) drug therapy: Secondary | ICD-10-CM | POA: Insufficient documentation

## 2021-03-24 DIAGNOSIS — I2510 Atherosclerotic heart disease of native coronary artery without angina pectoris: Secondary | ICD-10-CM | POA: Diagnosis not present

## 2021-03-24 DIAGNOSIS — C679 Malignant neoplasm of bladder, unspecified: Secondary | ICD-10-CM | POA: Diagnosis not present

## 2021-03-24 DIAGNOSIS — J432 Centrilobular emphysema: Secondary | ICD-10-CM | POA: Diagnosis not present

## 2021-03-24 DIAGNOSIS — Z9221 Personal history of antineoplastic chemotherapy: Secondary | ICD-10-CM | POA: Insufficient documentation

## 2021-03-24 DIAGNOSIS — N6310 Unspecified lump in the right breast, unspecified quadrant: Secondary | ICD-10-CM | POA: Diagnosis not present

## 2021-03-24 DIAGNOSIS — I700 Atherosclerosis of aorta: Secondary | ICD-10-CM | POA: Diagnosis not present

## 2021-03-24 LAB — CBC WITH DIFFERENTIAL (CANCER CENTER ONLY)
Abs Immature Granulocytes: 0.01 10*3/uL (ref 0.00–0.07)
Basophils Absolute: 0 10*3/uL (ref 0.0–0.1)
Basophils Relative: 1 %
Eosinophils Absolute: 0.1 10*3/uL (ref 0.0–0.5)
Eosinophils Relative: 3 %
HCT: 38.7 % (ref 36.0–46.0)
Hemoglobin: 12.5 g/dL (ref 12.0–15.0)
Immature Granulocytes: 0 %
Lymphocytes Relative: 40 %
Lymphs Abs: 1.8 10*3/uL (ref 0.7–4.0)
MCH: 33.2 pg (ref 26.0–34.0)
MCHC: 32.3 g/dL (ref 30.0–36.0)
MCV: 102.7 fL — ABNORMAL HIGH (ref 80.0–100.0)
Monocytes Absolute: 0.5 10*3/uL (ref 0.1–1.0)
Monocytes Relative: 10 %
Neutro Abs: 2 10*3/uL (ref 1.7–7.7)
Neutrophils Relative %: 46 %
Platelet Count: 227 10*3/uL (ref 150–400)
RBC: 3.77 MIL/uL — ABNORMAL LOW (ref 3.87–5.11)
RDW: 14.2 % (ref 11.5–15.5)
WBC Count: 4.4 10*3/uL (ref 4.0–10.5)
nRBC: 0 % (ref 0.0–0.2)

## 2021-03-24 LAB — CMP (CANCER CENTER ONLY)
ALT: 8 U/L (ref 0–44)
AST: 16 U/L (ref 15–41)
Albumin: 3.8 g/dL (ref 3.5–5.0)
Alkaline Phosphatase: 71 U/L (ref 38–126)
Anion gap: 5 (ref 5–15)
BUN: 20 mg/dL (ref 8–23)
CO2: 28 mmol/L (ref 22–32)
Calcium: 10.3 mg/dL (ref 8.9–10.3)
Chloride: 107 mmol/L (ref 98–111)
Creatinine: 1.07 mg/dL — ABNORMAL HIGH (ref 0.44–1.00)
GFR, Estimated: 53 mL/min — ABNORMAL LOW (ref >60)
Glucose, Bld: 108 mg/dL — ABNORMAL HIGH (ref 70–99)
Potassium: 4.7 mmol/L (ref 3.5–5.1)
Sodium: 140 mmol/L (ref 135–145)
Total Bilirubin: 0.8 mg/dL (ref 0.3–1.2)
Total Protein: 6.3 g/dL — ABNORMAL LOW (ref 6.5–8.1)

## 2021-03-24 LAB — LACTATE DEHYDROGENASE: LDH: 148 U/L (ref 98–192)

## 2021-03-24 MED ORDER — IOHEXOL 300 MG/ML  SOLN
100.0000 mL | Freq: Once | INTRAMUSCULAR | Status: AC | PRN
  Administered 2021-03-24: 100 mL via INTRAVENOUS

## 2021-03-24 NOTE — Progress Notes (Signed)
Hematology and Oncology Follow Up Visit  [name] [id number] 09-Nov-1940 80 y.o. 03/24/2021   Principle Diagnosis:  Stage IIIB (T1N3M0) invasive urothelial carcinoma of the bladder   Current Therapy:   Status post neoadjuvant chemotherapy with ddMVAC Radical cystectomy on 03/03/2020      Interim History:  Ms. Christina [name] is back for follow-up.  She will have her CT scan done today..  She is doing well.  Her urostomy is functioning okay.  On occasion, there is some leakage..  She has had no problems with nausea or vomiting.  She has had no issues with bleeding.  There is no fever.  She has had no cough.  There is been no leg swelling.  She has had no rashes.  She is outside a lot.  She is quite tanned.  I think she has a garden.  Sound like she might go to the beach later on this summer.  Overall, her performance status is ECOG 1.   Medications:  Current Outpatient Medications:    magnesium oxide (MAG-OX) 400 MG tablet, Take by mouth., Disp: , Rfl:    PARoxetine (PAXIL) 20 MG tablet, Take 1 tablet (20 mg total) by mouth daily., Disp: 90 tablet, Rfl: 1   potassium chloride SA (KLOR-CON) 20 MEQ tablet, Take 2 tablets (40 mEq total) by mouth daily. (Patient taking differently: Take 20 mEq by mouth daily.), Disp: 14 tablet, Rfl: 0   LORazepam (ATIVAN) 0.5 MG tablet, Take 1 tablet (0.5 mg total) by mouth 2 (two) times daily as needed for anxiety. (Patient not taking: Reported on 03/24/2021), Disp: 60 tablet, Rfl: 5 No current facility-administered medications for this visit.  Facility-Administered Medications Ordered in Other Visits:    sodium chloride flush (NS) 0.9 % injection 10 mL, 10 mL, Intravenous, PRN, Volanda Napoleon, MD, 10 mL at 10/26/20 1255  Allergies:  Allergies  Allergen Reactions   Nutrasweet Aspartame [Aspartame] Diarrhea   Lisinopril Cough    Past Medical History, Surgical history, Social history, and Family History were reviewed and updated.  Review of  Systems: Review of Systems  Constitutional: Negative.   HENT:  Negative.    Eyes: Negative.   Respiratory: Negative.    Cardiovascular: Negative.   Gastrointestinal: Negative.   Endocrine: Negative.   Genitourinary: Negative.    Musculoskeletal: Negative.   Skin: Negative.   Neurological: Negative.   Hematological: Negative.   Psychiatric/Behavioral: Negative.     Physical Exam:  weight is 159 lb 1.9 oz (72.2 kg). Her oral temperature is 98.2 F (36.8 C). Her blood pressure is 139/65 and her pulse is 71. Her respiration is 20 and oxygen saturation is 96%.   Wt Readings from Last 3 Encounters:  03/24/21 159 lb 1.9 oz (72.2 kg)  02/15/21 157 lb (71.2 kg)  01/11/21 155 lb (70.3 kg)    Physical Exam Vitals reviewed.  HENT:     Head: Normocephalic and atraumatic.  Eyes:     Pupils: Pupils are equal, round, and reactive to light.  Cardiovascular:     Rate and Rhythm: Normal rate and regular rhythm.     Heart sounds: Normal heart sounds.  Pulmonary:     Effort: Pulmonary effort is normal.     Breath sounds: Normal breath sounds.  Abdominal:     General: Bowel sounds are normal.     Palpations: Abdomen is soft.     Comments: Abdominal exam shows the healed laparotomy scar in the midline.  She has some firmness in the middle  of the laparotomy scar.  She has the urostomy bag which is well-positioned.  Urine is clear.  There is no fluid wave in the abdomen.  Bowel sounds are present.  She has no palpable liver or spleen tip.  Musculoskeletal:        General: No tenderness or deformity. Normal range of motion.     Cervical back: Normal range of motion.  Lymphadenopathy:     Cervical: No cervical adenopathy.  Skin:    General: Skin is warm and dry.     Findings: No erythema or rash.  Neurological:     Mental Status: She is alert and oriented to person, place, and time.  Psychiatric:        Behavior: Behavior normal.        Thought Content: Thought content normal.         Judgment: Judgment normal.    Lab Results  Component Value Date   WBC 4.4 03/24/2021   HGB 12.5 03/24/2021   HCT 38.7 03/24/2021   MCV 102.7 (H) 03/24/2021   PLT 227 03/24/2021     Chemistry      Component Value Date/Time   NA 140 03/24/2021 1025   NA 142 03/04/2017 1016   K 4.7 03/24/2021 1025   CL 107 03/24/2021 1025   CO2 28 03/24/2021 1025   BUN 20 03/24/2021 1025   BUN 13 03/04/2017 1016   CREATININE 1.07 (H) 03/24/2021 1025   CREATININE 0.78 05/20/2015 1704      Component Value Date/Time   CALCIUM 10.3 03/24/2021 1025   ALKPHOS 71 03/24/2021 1025   AST 16 03/24/2021 1025   ALT 8 03/24/2021 1025   BILITOT 0.8 03/24/2021 1025      Impression and Plan: Ms. Christina [name] is a 80 year old white female.  She had superficial bladder cancer.  She was treated with intravesicular therapy.  However, she has had had muscle invasive cancer which was poorly differentiated.  She then underwent neoadjuvant chemotherapy followed by radical cystectomy.  She still is found to have stage IIIB disease.  She definitely has a high risk of recurrence.  She needs to be followed with CAT scans every 3 months.   We will set her up with another scan in about 3 more months.  I would we will try to do this in early November.  I know that she is at risk for recurrence.  This is why we have to make sure we follow her closely.    Volanda Napoleon, MD 7/29/202211:59 AM

## 2021-03-24 NOTE — Telephone Encounter (Signed)
-----   Message from Volanda Napoleon, MD sent at 03/24/2021  3:09 PM EDT ----- Call -- NO cancer on the CT scan!!  [facility]

## 2021-03-24 NOTE — Telephone Encounter (Signed)
As noted below by Dr. Marin Olp, I informed the patient that there is NO cancer on the CT scan. She verbalized understanding.

## 2021-03-25 ENCOUNTER — Other Ambulatory Visit: Payer: Self-pay | Admitting: Family Medicine

## 2021-04-04 NOTE — Progress Notes (Signed)
[facility] [place] Phone: [phone] Subjective:   Rito Ehrlich, am serving as a scribe for Dr. Hulan Saas.  I'm seeing this patient by the request  of:  Kuneff, Renee A, DO  CC: Neck and low back pain  [id number]  02/15/2021 Patient is having the left hip pain.  Concerned that patient may be having worsening of the degenerative tear of the acetabular labrum.  In addition of this though there is still a good chance that this could be more secondary to lumbar radiculopathy.  Patient has had chemotherapy recently as well as does have a bladder cancer.  We will get repeat imaging to make sure there is no bony abnormalities.  Patient is highly anxious about this and may consider the possibility as well of advanced imaging especially if pain seems to worsen.  We will attempt to get notes from outside facility first so we are not repeating anything.  In addition to this patient would like to avoid significant number of medications and we discussed over-the-counter and natural supplementations that I think will be helpful.  Patient given home exercises and we discussed the potential for physical therapy but patient would like to do the home exercises first follow-up with me again in 4 to 8 weeks.  We did discuss the possibility of osteopathic manipulation which patient declined but we will keep as an option in future office appointments.  Patient has known arthritic changes of the back noted with facet arthropathy.  No longer responding to what it sounds to be facet injections from an outside facility but will get labs.  Patient has had an elevated calcium level previously and with a past medical history significant for cancer somewhat of a concern.  Patient is being followed by oncology and has a repeat CT scan coming up in the next month.  We will monitor as well.  Discussed with patient that the potential other injections could be  beneficial but will start with home exercises and over-the-counter medications including vitamin D supplementation.  Discussed the potential for osteopathic manipulation but when we did discuss the possibility with patient as bone health she would like to hold on that at the moment but we will consider it again at follow-up.  Follow-up with me again in 6 to 8 weeks  Update 04/05/2021 ESME NEGRO is a 80 y.o. female coming in with complaint of L hip pain. Patient states that her hip is in a lot of pain and could barely walk at all on Saturday. Patient states that the pain is located lateral thigh and front of thigh.    Patient did have a x-rays taken last exam.  X-rays of the lumbar spine degenerative changes as and severe facet arthropathy patient also had x-rays of the left hip showing advanced left hip arthropathy that has near bone-on-bone arthritic changes.  MRI lumbar 2020 IMPRESSION: S1 is a transitional vertebra.   L2-3 and L3-4: Mild disc bulges and facet hypertrophy.  No stenosis.   L4-5: Facet osteoarthritis with 2 mm of anterolisthesis. Mild bulging of the disc. No compressive stenosis.   L5-S1: Bulging of the disc. Facet osteoarthritis worse on the left. Left foraminal encroachment could possibly affect the left L5 nerve, though gross compression is not demonstrated. The facet arthropathy could certainly be a cause of low back pain or referred facet syndrome pain.   Reviewed patient's most recent CT chest abdomen and pelvis that was on December 16, 2018 musculoskeletal findings show that patient does have fairly significant degenerative disc disease of the lower lumbar spine.  Do not see anything except moderate arthritic changes of the hip joint   Patient also had a PET scan in March 2021 that did not show any metastatic disease.  Past Medical History:  Diagnosis Date   Arthritis    Asthma    a little?, no problems in several years   Bladder cancer ([place])    Bladder  tumor    Cervical cancer screening 01/31/2012   Chicken pox as a child   Degenerative tear of acetabular labrum of left hip 02/26/2019   Depression with anxiety 06/07/2009   Qualifier: Diagnosis of  By: [name] MD, Catherine     Dermatitis of external ear 07/11/2012   Diverticulosis    Ganglion cyst 02/26/2019   Right elbow   Hiatal hernia with gastroesophageal reflux 10/26/2010   Qualifier: Diagnosis of  By: [name] MD, Catherine     Hyperglycemia 06/21/2013   Hyperlipidemia, mixed 10/16/2015   pt unaware   Hypertension    Left hip pain 07/10/2015   Lesion of breast 12/03/2016   benign; resolved   Low back pain 05/29/2015   Measles as a child   Mumps as a child   Osteopenia 01/03/2017   Overweight (BMI 25.0-29.9) 10/07/2008   Qualifier: Diagnosis of  By: [name] MD, [name]     Palpitations    several years ago, not currently   Pre-diabetes    Psoriasis    ears   RUQ pain 05/29/2015   Spider veins    Bilateral legs   Spinal stenosis    Tailor's bunionette, left [id number]   Umbilical hernia    Urinary frequency 09/28/2011   Vertigo    Wears dentures    Upper,   Past Surgical History:  Procedure Laterality Date   ABDOMINAL SURGERY  1970's   BREAST BIOPSY Right 1980   BREAST EXCISIONAL BIOPSY   BREAST BIOPSY Left 1970   BREAST EXCISIONAL BIOPSY   BREAST CYST ASPIRATION     CATARACT EXTRACTION, BILATERAL     COLONOSCOPY     CYSTECTOMY     abdomen   CYSTOSCOPY W/ RETROGRADES Bilateral 04/27/2019   Procedure: CYSTOSCOPY WITH RETROGRADE PYELOGRAM;  Surgeon: Cleon Gustin, MD;  Location: [facility];  Service: Urology;  Laterality: Bilateral;   EYE SURGERY Bilateral    cataract   INSERTION OF MESH N/A 06/11/2017   Procedure: INSERTION OF MESH;  Surgeon: Rolm Bookbinder, MD;  Location: [place];  Service: General;  Laterality: N/A;  BILATERAL TAP BLOCK   IR REMOVAL TUN ACCESS W/ PORT W/O FL MOD SED  11/09/2020   TRANSURETHRAL RESECTION OF BLADDER TUMOR N/A  04/27/2019   Procedure: TRANSURETHRAL RESECTION OF BLADDER TUMOR (TURBT);  Surgeon: Cleon Gustin, MD;  Location: [facility];  Service: Urology;  Laterality: N/A;  1 HR   TRANSURETHRAL RESECTION OF BLADDER TUMOR N/A 06/01/2019   Procedure: TRANSURETHRAL RESECTION OF BLADDER TUMOR (TURBT);  Surgeon: Cleon Gustin, MD;  Location: [facility];  Service: Urology;  Laterality: N/A;  1 HR   TRANSURETHRAL RESECTION OF BLADDER TUMOR N/A 10/22/2019   Procedure: TRANSURETHRAL RESECTION OF BLADDER TUMOR (TURBT);  Surgeon: Cleon Gustin, MD;  Location: [facility];  Service: Urology;  Laterality: N/A;   TUBAL LIGATION  1970   VENTRAL HERNIA REPAIR N/A 06/11/2017   Procedure: LAPAROSCOPIC VENTRAL HERNIA REPAIR WITH MESH ERAS PATHWAY;  Surgeon: Rolm Bookbinder, MD;  Location: [place];  Service: General;  Laterality: N/A;  BILATERAL TAP BLOCK   Social History   Socioeconomic History   Marital status: Widowed    Spouse name: Not on file   Number of children: 2   Years of education: Not on file   Highest education level: Not on file  Occupational History   Occupation: retired  Tobacco Use   Smoking status: Former    Packs/day: 1.00    Years: 30.00    Pack years: 30.00    Types: Cigarettes    Quit date: 08/27/1990    Years since quitting: 30.6   Smokeless tobacco: Never  Vaping Use   Vaping Use: Never used  Substance and Sexual Activity   Alcohol use: No   Drug use: No   Sexual activity: Not Currently    Birth control/protection: Post-menopausal    Comment: lives alone, no dietary restrictions  Other Topics Concern   Not on file  Social History Narrative   Retired. Lives alone.    Attended some business college.    Former smoker.    Smoke alarm in the home. Wears seat balt.    Wears dentures.    Feels safe in her relationships.       Social Determinants of Health   Financial Resource Strain: Low Risk    Difficulty of Paying  Living Expenses: Not hard at all  Food Insecurity: No Food Insecurity   Worried About Charity fundraiser in the Last Year: Never true   [place] in the Last Year: Never true  Transportation Needs: No Transportation Needs   Lack of Transportation (Medical): No   Lack of Transportation (Non-Medical): No  Physical Activity: Insufficiently Active   Days of Exercise per Week: 2 days   Minutes of Exercise per Session: 50 min  Stress: No Stress Concern Present   Feeling of Stress : Not at all  Social Connections: Moderately Integrated   Frequency of Communication with Friends and Family: More than three times a week   Frequency of Social Gatherings with Friends and Family: More than three times a week   Attends Religious Services: More than 4 times per year   Active Member of [employer] or Organizations: Yes   Attends Archivist Meetings: More than 4 times per year   Marital Status: Widowed   Allergies  Allergen Reactions   Nutrasweet Aspartame [Aspartame] Diarrhea   Lisinopril Cough   Family History  Problem Relation Age of Onset   Hypertension Mother    Anxiety disorder Mother    Ovarian cancer Mother 47       lung- smoker, ovarian   Lung cancer Mother        smoker   Arthritis Mother    Hyperlipidemia Mother    Alcohol abuse Father    Diabetes Sister    Hypertension Sister    Ovarian cancer Sister    Arthritis Sister    Depression Sister    Hyperlipidemia Sister    Other Maternal Grandmother        pacemaker   Multiple sclerosis Maternal Grandfather        ?   Arthritis Brother    COPD Brother    Heart attack Brother    Hyperlipidemia Brother    Stroke Brother    Asthma Sister    Depression Sister    Hyperlipidemia Sister    Breast cancer Sister    Asthma Sister  Depression Sister    Asthma Paternal Uncle               Current Outpatient Medications (Other):    magnesium oxide (MAG-OX) 400 MG tablet, Take by mouth.   PARoxetine  (PAXIL) 20 MG tablet, TAKE 1 TABLET BY MOUTH EVERY DAY   potassium chloride SA (KLOR-CON) 20 MEQ tablet, Take 2 tablets (40 mEq total) by mouth daily. (Patient taking differently: Take 20 mEq by mouth daily.)   LORazepam (ATIVAN) 0.5 MG tablet, Take 1 tablet (0.5 mg total) by mouth 2 (two) times daily as needed for anxiety. (Patient not taking: No sig reported)   Facility-Administered Medications Ordered in Other Visits (Other):    sodium chloride flush (NS) 0.9 % injection 10 mL No current facility-administered medications for this visit.   Reviewed prior external information including notes and imaging from  primary care provider As well as notes that were available from care everywhere and other healthcare systems.  Past medical history, social, surgical and family history all reviewed in electronic medical record.  No pertanent information unless stated regarding to the chief complaint.   Review of Systems:  No headache, visual changes, nausea, vomiting, diarrhea, constipation, dizziness, abdominal pain, skin rash, fevers, chills, night sweats, weight loss, swollen lymph nodes, body aches, joint swelling, chest pain, shortness of breath, mood changes. POSITIVE muscle aches  Objective  Blood pressure 120/70, pulse 80, height '5\' 5"'$  (1.651 m), weight 155 lb (70.3 kg), SpO2 98 %.   General: No apparent distress alert and oriented x3 mood and affect normal, dressed appropriately.  HEENT: Pupils equal, extraocular movements intact  Respiratory: Patient's speak in full sentences and does not appear short of breath  Cardiovascular: No lower extremity edema, non tender, no erythema  Severely antalgic. Patient is less than 5 degrees of internal rotation of the left hip noted.  Only 25 degrees of external range of motion.  Patient does have mild crepitus noted with range of motion.  4-5 strength of the hip compared to the contralateral side.    Impression and Recommendations:     The above  documentation has been reviewed and is accurate and complete Lyndal Pulley, DO

## 2021-04-05 ENCOUNTER — Ambulatory Visit: Payer: Medicare PPO | Admitting: Family Medicine

## 2021-04-05 ENCOUNTER — Other Ambulatory Visit: Payer: Self-pay

## 2021-04-05 DIAGNOSIS — M1612 Unilateral primary osteoarthritis, left hip: Secondary | ICD-10-CM | POA: Diagnosis not present

## 2021-04-05 NOTE — Patient Instructions (Addendum)
Good to see you  [facility] Dr. Rhona Raider referral placed for hip replacement  [phone] to call and schedule [place]. I am here for any questions or concerns

## 2021-04-05 NOTE — Assessment & Plan Note (Signed)
Patient has significant amount of arthritic changes.  Starting to have increasing instability noted as well.  Patient has failed all other conservative therapy at this point and I do feel that surgical intervention is necessary.  I do think the patient will be a surgical candidate for total hip replacement.  Patient is now here for any other questions encouraged her to follow-up with me as needed.

## 2021-04-18 ENCOUNTER — Other Ambulatory Visit: Payer: Self-pay | Admitting: Family Medicine

## 2021-04-25 ENCOUNTER — Other Ambulatory Visit: Payer: Self-pay

## 2021-04-25 ENCOUNTER — Ambulatory Visit (INDEPENDENT_AMBULATORY_CARE_PROVIDER_SITE_OTHER): Payer: Medicare PPO | Admitting: Family Medicine

## 2021-04-25 ENCOUNTER — Encounter: Payer: Self-pay | Admitting: Family Medicine

## 2021-04-25 VITALS — BP 92/59 | HR 90 | Temp 97.3°F | Ht 65.0 in | Wt 161.0 lb

## 2021-04-25 DIAGNOSIS — C679 Malignant neoplasm of bladder, unspecified: Secondary | ICD-10-CM

## 2021-04-25 DIAGNOSIS — F418 Other specified anxiety disorders: Secondary | ICD-10-CM

## 2021-04-25 MED ORDER — PAROXETINE HCL 20 MG PO TABS: 20.0000 mg | ORAL_TABLET | Freq: Every day | ORAL | 1 refills | Status: DC

## 2021-04-25 MED ORDER — LORAZEPAM 0.5 MG PO TABS: 0.5000 mg | ORAL_TABLET | Freq: Two times a day (BID) | ORAL | 5 refills | Status: DC | PRN

## 2021-04-25 NOTE — Patient Instructions (Signed)
Great to see you today.  I have refilled the medication(s) we provide.   If labs were collected, we will inform you of lab results once received either by echart message or telephone call.   - echart message- for normal results that have been seen by the patient already.   - telephone call: abnormal results or if patient has not viewed results in their echart.   Next appt in February for physical and fasting labs with chronic conditions.

## 2021-04-25 NOTE — Progress Notes (Signed)
Christina [name] , 09-Nov-1940, 80 y.o., female MRN: [id number] Patient Care Team    Relationship Specialty Notifications Start End  Ma Hillock, DO PCP - General Family Medicine  09/02/17   Rolm Bookbinder, MD Consulting Physician General Surgery  10/05/16   Monna Fam, MD Consulting Physician Ophthalmology  10/05/16   Garry Heater, DDS Consulting Physician Dentistry  10/05/16   Juanita Craver, MD Consulting Physician Gastroenterology  09/02/17   Volanda Napoleon, MD Medical Oncologist Oncology  05/14/19   Cleon Gustin, MD Consulting Physician Urology  01/12/21   Suella Broad, MD Consulting Physician Physical Medicine and Rehabilitation  01/12/21   Lyndal Pulley, DO Consulting Physician Sports Medicine  01/12/21     Chief Complaint  Patient presents with   Anxiety    Christina [name]; pt is no fasting     Subjective:  Christina [name]  is a 80 y.o. female presents today for follow up [facility] Situational anxiety:  Pt reports she is feeling well on paxil 20 mg daily and ativan. She does not use ativan often and per [facility] she had only filled once in Oct 2021. She reports she does need refills on both meds today. Prior note: Was started on Paxil by her oncology team at the onset of diagnosis of bladder cancer. She does feel that it has been helpful along with the Ativan 0.5 mg twice daily. She reports she had a provider tell her that Zoloft is better for her than Paxil and she wanted to try zoloft. Switch was made last appt and she feels paxil was a better fit for her. She does have trouble sleeping some nights. She reports ativan  Mg qhs is usually helpful but sometimes she requires additional coverage.    No results for input(s): HGB, HCT, WBC, PLT in the last 168 hours.  CMP Latest Ref Rng & Units 03/24/2021 12/15/2020 10/26/2020  Glucose 70 - 99 mg/dL 108(H) 134(H) 101(H)  BUN 8 - 23 mg/dL 20 25(H) 20  Creatinine 0.44 - 1.00 mg/dL 1.07(H) 1.25(H) 1.00  Sodium 135 - 145 mmol/L 140 140  137  Potassium 3.5 - 5.1 mmol/L 4.7 4.4 4.5  Chloride 98 - 111 mmol/L 107 104 105  CO2 22 - 32 mmol/L '28 29 27  '$ Calcium 8.9 - 10.3 mg/dL 10.3 10.8(H) 10.5(H)  Total Protein 6.5 - 8.1 g/dL 6.3(L) 6.6 6.2(L)  Total Bilirubin 0.3 - 1.2 mg/dL 0.8 0.8 0.7  Alkaline Phos 38 - 126 U/L 71 70 65  AST 15 - 41 U/L 16 14(L) 13(L)  ALT 0 - 44 U/L '8 7 6    '$ No results found.   Depression screen [facility] 2/9 04/25/2021 12/28/2020 05/30/2020 01/14/2020 09/16/2019  Decreased Interest 1 0 0 0 0  Down, Depressed, Hopeless 0 0 1 0 1  PHQ - 2 Score 1 0 1 0 1  Altered sleeping 1 - 0 - -  Tired, decreased energy 1 - 1 - -  Change in appetite 0 - 0 - -  Feeling bad or failure about yourself  0 - 0 - -  Trouble concentrating 0 - 0 - -  Moving slowly or fidgety/restless 0 - 0 - -  Suicidal thoughts 0 - 0 - -  PHQ-9 Score 3 - 2 - -  Some recent data might be hidden   GAD 7 : Generalized Anxiety Score 04/25/2021 05/30/2020 01/14/2020 09/16/2019  Nervous, Anxious, on Edge 1 1 0 0  Control/stop worrying 0 1 0 3  Worry too much - different things 0 1 0 3  Trouble relaxing 0 0 0 0  Restless 0 0 0 3  Easily annoyed or irritable 1 0 0 0  Afraid - awful might happen 1 0 0 0  Total GAD 7 Score 3 3 0 9  Anxiety Difficulty - - Not difficult at all Not difficult at all     Allergies  Allergen Reactions   Nutrasweet Aspartame [Aspartame] Diarrhea   Lisinopril Cough   Social History   Tobacco Use   Smoking status: Former    Packs/day: 1.00    Years: 30.00    Pack years: 30.00    Types: Cigarettes    Quit date: 08/27/1990    Years since quitting: 30.6   Smokeless tobacco: Never  Substance Use Topics   Alcohol use: No   Past Medical History:  Diagnosis Date   Arthritis    Asthma    a little?, no problems in several years   Bladder cancer ([place])    Bladder tumor    Cervical cancer screening 01/31/2012   Chicken pox as a child   Degenerative tear of acetabular labrum of left hip 02/26/2019   Dehydration 01/19/2020    Depression with anxiety 06/07/2009   Qualifier: Diagnosis of  By: [name] MD, Catherine     Dermatitis of external ear 07/11/2012   Diverticulosis    Essential hypertension, benign 10/13/2007   Qualifier: Diagnosis of  By: [name] MD, Catherine     Ganglion cyst 02/26/2019   Right elbow   Hiatal hernia with gastroesophageal reflux 10/26/2010   Qualifier: Diagnosis of  By: [name] MD, Catherine     Hyperglycemia 06/21/2013   Hyperlipidemia, mixed 10/16/2015   pt unaware   Hypertension    Left hip pain 07/10/2015   Lesion of breast 12/03/2016   benign; resolved   Low back pain 05/29/2015   Measles as a child   Mumps as a child   Osteopenia 01/03/2017   Overweight (BMI 25.0-29.9) 10/07/2008   Qualifier: Diagnosis of  By: [name] MD, [name]     Palpitations    several years ago, not currently   Pre-diabetes    Psoriasis    ears   RUQ pain 05/29/2015   Spider veins    Bilateral legs   Spinal stenosis    Tailor's bunionette, left [id number]   Umbilical hernia    Urinary frequency 09/28/2011   Vertigo    Wears dentures    Upper,   Past Surgical History:  Procedure Laterality Date   ABDOMINAL SURGERY  1970's   BREAST BIOPSY Right 1980   BREAST EXCISIONAL BIOPSY   BREAST BIOPSY Left 1970   BREAST EXCISIONAL BIOPSY   BREAST CYST ASPIRATION     CATARACT EXTRACTION, BILATERAL     COLONOSCOPY     CYSTECTOMY     abdomen   CYSTOSCOPY W/ RETROGRADES Bilateral 04/27/2019   Procedure: CYSTOSCOPY WITH RETROGRADE PYELOGRAM;  Surgeon: Cleon Gustin, MD;  Location: [facility];  Service: Urology;  Laterality: Bilateral;   EYE SURGERY Bilateral    cataract   INSERTION OF MESH N/A 06/11/2017   Procedure: INSERTION OF MESH;  Surgeon: Rolm Bookbinder, MD;  Location: [place];  Service: General;  Laterality: N/A;  BILATERAL TAP BLOCK   IR REMOVAL TUN ACCESS W/ PORT W/O FL MOD SED  11/09/2020   TRANSURETHRAL RESECTION OF BLADDER TUMOR N/A 04/27/2019   Procedure:  TRANSURETHRAL RESECTION OF BLADDER TUMOR (TURBT);  Surgeon: Nicolette Bang  L, MD;  Location: [place];  Service: Urology;  Laterality: N/A;  1 HR   TRANSURETHRAL RESECTION OF BLADDER TUMOR N/A 06/01/2019   Procedure: TRANSURETHRAL RESECTION OF BLADDER TUMOR (TURBT);  Surgeon: Cleon Gustin, MD;  Location: [facility];  Service: Urology;  Laterality: N/A;  1 HR   TRANSURETHRAL RESECTION OF BLADDER TUMOR N/A 10/22/2019   Procedure: TRANSURETHRAL RESECTION OF BLADDER TUMOR (TURBT);  Surgeon: Cleon Gustin, MD;  Location: [facility];  Service: Urology;  Laterality: N/A;   TUBAL LIGATION  1970   VENTRAL HERNIA REPAIR N/A 06/11/2017   Procedure: LAPAROSCOPIC VENTRAL HERNIA REPAIR WITH MESH ERAS PATHWAY;  Surgeon: Rolm Bookbinder, MD;  Location: [place];  Service: General;  Laterality: N/A;  BILATERAL TAP BLOCK   Family History  Problem Relation Age of Onset   Hypertension Mother    Anxiety disorder Mother    Ovarian cancer Mother 47       lung- smoker, ovarian   Lung cancer Mother        smoker   Arthritis Mother    Hyperlipidemia Mother    Alcohol abuse Father    Diabetes Sister    Hypertension Sister    Ovarian cancer Sister    Arthritis Sister    Depression Sister    Hyperlipidemia Sister    Other Maternal Grandmother        pacemaker   Multiple sclerosis Maternal Grandfather        ?   Arthritis Brother    COPD Brother    Heart attack Brother    Hyperlipidemia Brother    Stroke Brother    Asthma Sister    Depression Sister    Hyperlipidemia Sister    Breast cancer Sister    Asthma Sister    Depression Sister    Asthma Paternal Uncle    Allergies as of 04/25/2021       Reactions   Nutrasweet Aspartame [aspartame] Diarrhea   Lisinopril Cough        Medication List        Accurate as of April 25, 2021 11:51 AM. If you have any questions, ask your nurse or doctor.          STOP taking these  medications    potassium chloride SA 20 MEQ tablet Commonly known as: KLOR-CON Stopped by: Howard Pouch, DO       TAKE these medications    LORazepam 0.5 MG tablet Commonly known as: ATIVAN Take 1 tablet (0.5 mg total) by mouth 2 (two) times daily as needed for anxiety.   magnesium oxide 400 MG tablet Commonly known as: MAG-OX Take by mouth.   PARoxetine 20 MG tablet Commonly known as: PAXIL TAKE 1 TABLET BY MOUTH EVERY DAY        All past medical history, surgical history, allergies, family history, immunizations and medications were updated in the EMR today and reviewed under the history and medication portions of their EMR.      ROS: Negative, with the exception of above mentioned in HPI   Objective:  BP (!) 92/59   Pulse 90   Temp (!) 97.3 F (36.3 C) (Oral)   Ht '5\' 5"'$  (1.651 m)   Wt 161 lb (73 kg)   SpO2 98%   BMI 26.79 kg/m  Body mass index is 26.79 kg/m. Gen: Afebrile. No acute distress.  HENT: AT. [place].  Eyes:Pupils Equal Round Reactive to light, Extraocular movements intact,  Conjunctiva without  redness, discharge or icterus. CV: RRR no murmur, no edema Chest: CTAB, no wheeze or crackles Neuro: Normal gait. PERLA. EOMi. Alert. Orientedx3 Psych: Normal affect, dress and demeanor. Normal speech. Normal thought content and judgment.   Assessment/Plan: ARELIA PAS is a 80 y.o. female present for [facility] Situational anxiety/bladder cancer Patient had bladder cancer diagnosis and radical cystectomy. -Continue paxil 20 mg today per pt preference.  -Continue Ativan 0.5 mg-1 mg qhs  daily as needed (can be qhs if needed- must days 0.5 mg works well enough for her). [place] reviewed.  Next appt 5.5 months unless needed sooner.     Reviewed expectations re: course of current medical issues. Discussed self-management of symptoms. Outlined signs and symptoms indicating need for more acute intervention. Patient verbalized understanding and all  questions were answered. Patient received an After-Visit Summary. Any changes in medications were reviewed and patient was provided with updated med list with their AVS.     No orders of the defined types were placed in this encounter.  No orders of the defined types were placed in this encounter.     Note is dictated utilizing voice recognition software. Although note has been proof read prior to signing, occasional typographical errors still can be missed. If any questions arise, please do not hesitate to call for verification.   electronically signed by:  Howard Pouch, DO  [place]

## 2021-04-26 DIAGNOSIS — M1612 Unilateral primary osteoarthritis, left hip: Secondary | ICD-10-CM | POA: Diagnosis not present

## 2021-05-04 ENCOUNTER — Telehealth: Payer: Self-pay | Admitting: Family Medicine

## 2021-05-04 NOTE — Telephone Encounter (Signed)
Received faxed surgical clearance form from [place]. Placed in Dr. Lucita Lora front office inbox.

## 2021-05-05 NOTE — Telephone Encounter (Signed)
Appt moved to 05/09/21 due to cancellation.

## 2021-05-05 NOTE — Telephone Encounter (Signed)
Surgical clearance forms received on 05/05/21. Patient has been scheduled on 05/23/21 to surgical clearance appt. Forms have been placed on PCP desk on 05/05/21.  Please advise if pt can be seen before scheduled appt

## 2021-05-09 ENCOUNTER — Ambulatory Visit: Payer: Medicare PPO | Admitting: Family Medicine

## 2021-05-09 ENCOUNTER — Encounter: Payer: Self-pay | Admitting: Family Medicine

## 2021-05-09 ENCOUNTER — Telehealth: Payer: Self-pay | Admitting: Family Medicine

## 2021-05-09 ENCOUNTER — Other Ambulatory Visit: Payer: Self-pay

## 2021-05-09 VITALS — BP 118/78 | HR 70 | Temp 98.2°F | Wt 161.2 lb

## 2021-05-09 DIAGNOSIS — N1831 Chronic kidney disease, stage 3a: Secondary | ICD-10-CM

## 2021-05-09 DIAGNOSIS — T451X5A Adverse effect of antineoplastic and immunosuppressive drugs, initial encounter: Secondary | ICD-10-CM

## 2021-05-09 DIAGNOSIS — Z01818 Encounter for other preprocedural examination: Secondary | ICD-10-CM

## 2021-05-09 DIAGNOSIS — Z6826 Body mass index (BMI) 26.0-26.9, adult: Secondary | ICD-10-CM

## 2021-05-09 DIAGNOSIS — Z936 Other artificial openings of urinary tract status: Secondary | ICD-10-CM | POA: Diagnosis not present

## 2021-05-09 DIAGNOSIS — R7309 Other abnormal glucose: Secondary | ICD-10-CM | POA: Diagnosis not present

## 2021-05-09 DIAGNOSIS — D6481 Anemia due to antineoplastic chemotherapy: Secondary | ICD-10-CM

## 2021-05-09 DIAGNOSIS — Z6828 Body mass index (BMI) 28.0-28.9, adult: Secondary | ICD-10-CM | POA: Insufficient documentation

## 2021-05-09 DIAGNOSIS — Z8551 Personal history of malignant neoplasm of bladder: Secondary | ICD-10-CM

## 2021-05-09 DIAGNOSIS — M1612 Unilateral primary osteoarthritis, left hip: Secondary | ICD-10-CM | POA: Diagnosis not present

## 2021-05-09 DIAGNOSIS — M24152 Other articular cartilage disorders, left hip: Secondary | ICD-10-CM

## 2021-05-09 LAB — CBC WITH DIFFERENTIAL/PLATELET
Basophils Absolute: 0 10*3/uL (ref 0.0–0.1)
Basophils Relative: 0.7 % (ref 0.0–3.0)
Eosinophils Absolute: 0.1 10*3/uL (ref 0.0–0.7)
Eosinophils Relative: 2.1 % (ref 0.0–5.0)
HCT: 39.5 % (ref 36.0–46.0)
Hemoglobin: 13 g/dL (ref 12.0–15.0)
Lymphocytes Relative: 38.2 % (ref 12.0–46.0)
Lymphs Abs: 1.4 10*3/uL (ref 0.7–4.0)
MCHC: 32.9 g/dL (ref 30.0–36.0)
MCV: 100 fl (ref 78.0–100.0)
Monocytes Absolute: 0.3 10*3/uL (ref 0.1–1.0)
Monocytes Relative: 7.8 % (ref 3.0–12.0)
Neutro Abs: 1.9 10*3/uL (ref 1.4–7.7)
Neutrophils Relative %: 51.2 % (ref 43.0–77.0)
Platelets: 171 10*3/uL (ref 150.0–400.0)
RBC: 3.95 Mil/uL (ref 3.87–5.11)
RDW: 13.7 % (ref 11.5–15.5)
WBC: 3.7 10*3/uL — ABNORMAL LOW (ref 4.0–10.5)

## 2021-05-09 LAB — COMPREHENSIVE METABOLIC PANEL
ALT: 8 U/L (ref 0–35)
AST: 15 U/L (ref 0–37)
Albumin: 3.8 g/dL (ref 3.5–5.2)
Alkaline Phosphatase: 65 U/L (ref 39–117)
BUN: 17 mg/dL (ref 6–23)
CO2: 25 mEq/L (ref 19–32)
Calcium: 10.1 mg/dL (ref 8.4–10.5)
Chloride: 108 mEq/L (ref 96–112)
Creatinine, Ser: 1.03 mg/dL (ref 0.40–1.20)
GFR: 51.76 mL/min — ABNORMAL LOW (ref >60.00)
Glucose, Bld: 103 mg/dL — ABNORMAL HIGH (ref 70–99)
Potassium: 4.4 mEq/L (ref 3.5–5.1)
Sodium: 140 mEq/L (ref 135–145)
Total Bilirubin: 0.8 mg/dL (ref 0.2–1.2)
Total Protein: 6.1 g/dL (ref 6.0–8.3)

## 2021-05-09 LAB — PROTIME-INR
INR: 1 ratio (ref 0.8–1.0)
Prothrombin Time: 10.6 s (ref 9.6–13.1)

## 2021-05-09 LAB — HEMOGLOBIN A1C: Hgb A1c MFr Bld: 5.8 % (ref 4.6–6.5)

## 2021-05-09 NOTE — Telephone Encounter (Signed)
Forms faxed and copy of clearance mailed to pt

## 2021-05-09 NOTE — Progress Notes (Signed)
This visit occurred during the SARS-CoV-2 public health emergency.  Safety protocols were in place, including screening questions prior to the visit, additional usage of staff PPE, and extensive cleaning of exam room while observing appropriate contact time as indicated for disinfecting solutions.    Christina [name] , July 27, 1941, 80 y.o., female MRN: [id number] Patient Care Team    Relationship Specialty Notifications Start End  Ma Hillock, DO PCP - General Family Medicine  09/02/17   Rolm Bookbinder, MD Consulting Physician General Surgery  10/05/16   Monna Fam, MD Consulting Physician Ophthalmology  10/05/16   Garry Heater, [place] Physician Dentistry  10/05/16   Juanita Craver, MD Consulting Physician Gastroenterology  09/02/17   Volanda Napoleon, MD Medical Oncologist Oncology  05/14/19   Cleon Gustin, MD Consulting Physician Urology  01/12/21   Suella Broad, MD Consulting Physician Physical Medicine and Rehabilitation  01/12/21   Lyndal Pulley, DO Consulting Physician Sports Medicine  01/12/21     Chief Complaint  Patient presents with   surgical clearance    Pt is fasting     Subjective: Pt presents for an OV for preoperative risk assessment by request of her orthopedic Dr. Rhona Raider Procedure:left Hip Anterior Hip Arthroplasty Indication:Arthritis - degenerative labrum left hip.  Anesthesia:spinal Surgery type risk:   - Intermediate risk= orthopedics Prior anesthesia complications: Pt denies prior complications.  Family history of prior anesthesia complications: Pt denies complications.  Cardiac: Pt denies prior history of CBD, PAD, stroke, MI, aortic stenosis.   - METs: > 4 METS Pulmonary: FORMER smoker (quit 1992).   Endocrine: no reported history.  Obesity:Body mass index is 26.83 kg/m. Chronic kidney disease:prior CKD3 GFR 50 Chronic med that needs to be continued: paxil Anticoagulation: not anticoagulated  Depression screen [facility] 2/9  04/25/2021 12/28/2020 05/30/2020 01/14/2020 09/16/2019  Decreased Interest 1 0 0 0 0  Down, Depressed, Hopeless 0 0 1 0 1  PHQ - 2 Score 1 0 1 0 1  Altered sleeping 1 - 0 - -  Tired, decreased energy 1 - 1 - -  Change in appetite 0 - 0 - -  Feeling bad or failure about yourself  0 - 0 - -  Trouble concentrating 0 - 0 - -  Moving slowly or fidgety/restless 0 - 0 - -  Suicidal thoughts 0 - 0 - -  PHQ-9 Score 3 - 2 - -  Some recent data might be hidden    Allergies  Allergen Reactions   Nutrasweet Aspartame [Aspartame] Diarrhea   Lisinopril Cough   Social History   Social History Narrative   Retired. Lives alone.    Attended some business college.    Former smoker.    Smoke alarm in the home. Wears seat balt.    Wears dentures.    Feels safe in her relationships.       Past Medical History:  Diagnosis Date   Arthritis    Asthma    a little?, no problems in several years   Bladder cancer [facility])    Bladder tumor    Cervical cancer screening 01/31/2012   Chicken pox as a child   Degenerative tear of acetabular labrum of left hip 02/26/2019   Dehydration 01/19/2020   Depression with anxiety 06/07/2009   Qualifier: Diagnosis of  By: [name] MD, Catherine     Dermatitis of external ear 07/11/2012   Diverticulosis    Essential hypertension, benign 10/13/2007   Qualifier: Diagnosis of  By:  [place] MD, [name]     Ganglion cyst 02/26/2019   Right elbow   Hiatal hernia with gastroesophageal reflux 10/26/2010   Qualifier: Diagnosis of  By: [name] MD, Catherine     Hyperglycemia 06/21/2013   Hyperlipidemia, mixed 10/16/2015   pt unaware   Hypertension    Left hip pain 07/10/2015   Lesion of breast 12/03/2016   benign; resolved   Low back pain 05/29/2015   Measles as a child   Mumps as a child   Osteopenia 01/03/2017   Overweight (BMI 25.0-29.9) 10/07/2008   Qualifier: Diagnosis of  By: [name] MD, [name]     Palpitations    several years ago, not currently   Pre-diabetes     Psoriasis    ears   RUQ pain 05/29/2015   Spider veins    Bilateral legs   Spinal stenosis    Tailor's bunionette, left 0/10/5007   Umbilical hernia    Urinary frequency 09/28/2011   Vertigo    Wears dentures    Upper,   Past Surgical History:  Procedure Laterality Date   ABDOMINAL SURGERY  1970's   BREAST BIOPSY Right 1980   BREAST EXCISIONAL BIOPSY   BREAST BIOPSY Left 1970   BREAST EXCISIONAL BIOPSY   BREAST CYST ASPIRATION     CATARACT EXTRACTION, BILATERAL     COLONOSCOPY     CYSTECTOMY     abdomen   CYSTOSCOPY W/ RETROGRADES Bilateral 04/27/2019   Procedure: CYSTOSCOPY WITH RETROGRADE PYELOGRAM;  Surgeon: Cleon Gustin, MD;  Location: [facility];  Service: Urology;  Laterality: Bilateral;   EYE SURGERY Bilateral    cataract   INSERTION OF MESH N/A 06/11/2017   Procedure: INSERTION OF MESH;  Surgeon: Rolm Bookbinder, MD;  Location: [place];  Service: General;  Laterality: N/A;  BILATERAL TAP BLOCK   IR REMOVAL TUN ACCESS W/ PORT W/O FL MOD SED  11/09/2020   TRANSURETHRAL RESECTION OF BLADDER TUMOR N/A 04/27/2019   Procedure: TRANSURETHRAL RESECTION OF BLADDER TUMOR (TURBT);  Surgeon: Cleon Gustin, MD;  Location: [facility];  Service: Urology;  Laterality: N/A;  1 HR   TRANSURETHRAL RESECTION OF BLADDER TUMOR N/A 06/01/2019   Procedure: TRANSURETHRAL RESECTION OF BLADDER TUMOR (TURBT);  Surgeon: Cleon Gustin, MD;  Location[facility];  Service: Urology;  Laterality: N/A;  1 HR   TRANSURETHRAL RESECTION OF BLADDER TUMOR N/A 10/22/2019   Procedure: TRANSURETHRAL RESECTION OF BLADDER TUMOR (TURBT);  Surgeon: Cleon Gustin, MD;  Location[facility];  Service: Urology;  Laterality: N/A;   TUBAL LIGATION  1970   VENTRAL HERNIA REPAIR N/A 06/11/2017   Procedure: LAPAROSCOPIC VENTRAL HERNIA REPAIR WITH MESH ERAS PATHWAY;  Surgeon: Rolm Bookbinder, MD;  Location: [place];  Service: General;   Laterality: N/A;  BILATERAL TAP BLOCK   Family History  Problem Relation Age of Onset   Hypertension Mother    Anxiety disorder Mother    Ovarian cancer Mother 11       lung- smoker, ovarian   Lung cancer Mother        smoker   Arthritis Mother    Hyperlipidemia Mother    Alcohol abuse Father    Diabetes Sister    Hypertension Sister    Ovarian cancer Sister    Arthritis Sister    Depression Sister    Hyperlipidemia Sister    Other Maternal Grandmother        pacemaker   Multiple sclerosis Maternal  Grandfather        ?   Arthritis Brother    COPD Brother    Heart attack Brother    Hyperlipidemia Brother    Stroke Brother    Asthma Sister    Depression Sister    Hyperlipidemia Sister    Breast cancer Sister    Asthma Sister    Depression Sister    Asthma Paternal Uncle    Allergies as of 05/09/2021       Reactions   Nutrasweet Aspartame [aspartame] Diarrhea   Lisinopril Cough        Medication List        Accurate as of May 09, 2021  4:39 PM. If you have any questions, ask your nurse or doctor.          KLOR-CON PO Take 40 mEq by mouth daily.   LORazepam 0.5 MG tablet Commonly known as: ATIVAN Take 1 tablet (0.5 mg total) by mouth 2 (two) times daily as needed for anxiety.   magnesium oxide 400 MG tablet Commonly known as: MAG-OX Take by mouth.   PARoxetine 20 MG tablet Commonly known as: PAXIL Take 1 tablet (20 mg total) by mouth daily.   VITAMIN D PO Take by mouth.        All past medical history, surgical history, allergies, family history, immunizations andmedications were updated in the EMR today and reviewed under the history and medication portions of their EMR.     ROS: Negative, with the exception of above mentioned in HPI   Objective:  BP 118/78   Pulse 70   Temp 98.2 F (36.8 C) (Oral)   Wt 161 lb 3.2 oz (73.1 kg)   SpO2 96%   BMI 26.83 kg/m  Body mass index is 26.83 kg/m. Gen: Afebrile. No acute distress.  Nontoxic in appearance, well developed, well nourished.  HENT: AT. [place]. Bilateral TM visualized without erythema or bulging. MMM, no oral lesions. Bilateral nares without erythema, drainage or swelling. Throat without erythema or exudates.  No cough.  No hoarseness. Eyes:Pupils Equal Round Reactive to light, Extraocular movements intact,  Conjunctiva without redness, discharge or icterus. Neck/lymp/endocrine: Supple, no lymphadenopathy CV: RRR no murmur, no edema Chest: CTAB, no wheeze or crackles. Good air movement, normal resp effort.  Abd: Soft.  Urostomy bag in place, stoma appears well, no erythema.. NTND. BS present.  No masses palpated. No rebound or guarding.  Skin: No rashes, purpura or petechiae.  Neuro: Normal gait. PERLA. EOMi. Alert. Oriented x3  Psych: Normal affect, dress and demeanor. Normal speech. Normal thought content and judgment.  No results found. No results found. Results for orders placed or performed in visit on 05/09/21 (from the past 24 hour(s))  Protime-INR     Status: None   Collection Time: 05/09/21 11:00 AM  Result Value Ref Range   INR 1.0 0.8 - 1.0 ratio   Prothrombin Time 10.6 9.6 - 13.1 sec  CBC w/Diff     Status: Abnormal   Collection Time: 05/09/21 11:20 AM  Result Value Ref Range   WBC 3.7 (L) 4.0 - 10.5 K/uL   RBC 3.95 3.87 - 5.11 Mil/uL   Hemoglobin 13.0 12.0 - 15.0 g/dL   HCT 39.5 36.0 - 46.0 %   MCV 100.0 78.0 - 100.0 fl   MCHC 32.9 30.0 - 36.0 g/dL   RDW 13.7 11.5 - 15.5 %   Platelets 171.0 150.0 - 400.0 K/uL   Neutrophils Relative % 51.2 43.0 - 77.0 %  Lymphocytes Relative 38.2 12.0 - 46.0 %   Monocytes Relative 7.8 3.0 - 12.0 %   Eosinophils Relative 2.1 0.0 - 5.0 %   Basophils Relative 0.7 0.0 - 3.0 %   Neutro Abs 1.9 1.4 - 7.7 K/uL   Lymphs Abs 1.4 0.7 - 4.0 K/uL   Monocytes Absolute 0.3 0.1 - 1.0 K/uL   Eosinophils Absolute 0.1 0.0 - 0.7 K/uL   Basophils Absolute 0.0 0.0 - 0.1 K/uL  Comp Met (CMET)     Status: Abnormal    Collection Time: 05/09/21 11:20 AM  Result Value Ref Range   Sodium 140 135 - 145 mEq/L   Potassium 4.4 3.5 - 5.1 mEq/L   Chloride 108 96 - 112 mEq/L   CO2 25 19 - 32 mEq/L   Glucose, Bld 103 (H) 70 - 99 mg/dL   BUN 17 6 - 23 mg/dL   Creatinine, Ser 1.03 0.40 - 1.20 mg/dL   Total Bilirubin 0.8 0.2 - 1.2 mg/dL   Alkaline Phosphatase 65 39 - 117 U/L   AST 15 0 - 37 U/L   ALT 8 0 - 35 U/L   Total Protein 6.1 6.0 - 8.3 g/dL   Albumin 3.8 3.5 - 5.2 g/dL   GFR 51.76 (L) >60.00 mL/min   Calcium 10.1 8.4 - 10.5 mg/dL  Hemoglobin A1c     Status: None   Collection Time: 05/09/21 11:20 AM  Result Value Ref Range   Hgb A1c MFr Bld 5.8 4.6 - 6.5 %    Assessment/Plan: ROSHAWN LACINA is a 80 y.o. female present for OV for  Pre-operative general physical examination/Arthritis of left hip/Degenerative tear of acetabular labrum of left hip To the best of my knowledge and per patients reported PMH, there is not a medical contraindication for undergoing elective surgery.  She is of low/average risk for medical/cardiac complications at [facility].  Avoid NSAID prior to procedure, per orthopedic team instruction.  Patient understands the purpose of preoperative visit is to attempt to minimize surgical complications and communicate to surgical team chronic conditions and management. No patient is free of risk when undergoing a procedure. The decision about whether to proceed with the operation belongs to the surgeon and the patient. Patient's chronic conditions have been stable.  She is not anemic.  She is not a diabetic.   - Protime-INR> WNL - Hemoglobin A1c> normal.  - CBC w/Diff> WNL, no signs of anemia or pancytopenia.   BMI 26.0-26.9,adult Stage 3a chronic kidney disease (HCC) - Comp Met (CMET)> GFR stable approximately 50-55, with creatinine 1.03-1.1. Avoid nephrotoxic agents when able. appropriate hydration Renally dose medications if appropriate  Presence of urostomy (HCC)/History of  bladder cancer Caution with patient's urostomy care/bag.  Ensure protection of bag and stoma during surgical procedure/hospital stay. Patient also lives alone.  Her son is going to spend the night with her if able.  May need to consider 24-hour observation after surgery versus same-day surgery given age.   Reviewed expectations re: course of current medical issues. Discussed self-management of symptoms. Outlined signs and symptoms indicating need for more acute intervention. Patient verbalized understanding and all questions were answered. Patient received an After-Visit Summary.    Orders Placed This Encounter  Procedures   CBC w/Diff   Comp Met (CMET)   Protime-INR   Hemoglobin A1c   No orders of the defined types were placed in this encounter.  Referral Orders  No referral(s) requested today     Note is dictated utilizing voice  recognition software. Although note has been proof read prior to signing, occasional typographical errors still can be missed. If any questions arise, please do not hesitate to call for verification.   electronically signed by:  Howard Pouch, DO  [place]

## 2021-05-09 NOTE — Telephone Encounter (Signed)
Please inform patient her labs are all normal.  Please fax orthopedic team, Dr. Rhona Raider copies of preoperative office visit note, labs and the completed preoperative risk assessment form that was placed in the [place] work basket.  Thanks.

## 2021-05-09 NOTE — Patient Instructions (Signed)
Great to see you today.  Good luck with your surgery   Preparing for Hip Replacement Preparing before hip replacement surgery can make recovery easier and more comfortable. The information below gives some tips and guidelines that will help to prepare for this surgery. Talk with your health care provider so you can learn what to expect before, during, and after surgery. Ask questions if you do not understand something. Tell a health care provider about: Any allergies you have. All medicines you are taking, including vitamins, herbs, eye drops, creams, and over-the-counter medicines. Any problems you or family members have had with anesthetic medicines. Any blood disorders you have. Any surgeries you have had. Any medical conditions you have. Whether you are pregnant or may be pregnant. What happens before the procedure? Health care provider visits You will need to have a physical exam before you are scheduled for surgery (preoperative exam) to make sure it is safe for you to have surgery. This may require you to have more tests. When you go to the preoperative exam, bring a list of all the medicines, vitamins, herbs, and supplements that you take. Have dental care and routine cleanings done before surgery. Germs from anywhere in your body, including your mouth, can travel to your new joint and infect it. Tell your dentist that you plan to have hip replacement surgery. Surgery costs To find out how much surgery will cost, call your insurance company as soon as you decide to have surgery. Ask questions such as: How much of the surgery and hospital stay will be covered? What will be covered for the following items? Medical equipment. Rehabilitation facilities. Home care. Medicines. Preparing your home Pick a recovery spot that is not your bed. During recovery, it is better that you sit more upright than you can in your bed. Here are some tips for choosing a recovery spot: Choose a chair with  arms and a high, firm seat that will not allow you to sink down into it. Chairs and sofas that are too soft can allow your hip to bend at an angle greater than 90 degrees. This could put you at risk for moving your new hip joint out of place (dislocating it). Place items that you often use on a small table next to your chair. These items may include the TV remote control, a mobile phone, a book, a laptop computer, and a water glass. You may be given a walker to use at home. Check if you will have enough room to use a walker. Move around your home with your hands out about 6 inches (15 cm) from your sides. You will have enough room if you do not hit anything with your hands as you do this. Walk from: Your recovery spot to your kitchen and bathroom. Your bed to the bathroom. Apply these general tips: Move the items you use most often to shelves and drawers at countertop height. Do this in your kitchen, bathroom, and bedroom. Prepare some meals to freeze and reheat later. Home safety   Remove all clutter and throw rugs from your floors. Doing this will help you avoid tripping. Consider getting safety equipment that will be helpful during your recovery, such as: Grab bars in the shower and near the toilet. A raised toilet seat. This will help you get on and off the toilet more easily. A tub or shower bench. Preparing your body If you smoke, quit as soon as you can before surgery. If there is time, it is  best to quit several months before surgery. Tell your surgeon if you use any products that contain nicotine or tobacco, such as cigarettes, e-cigarettes, and chewing tobacco. These can delay healing. If you need help quitting, ask your health care provider. Maintain a healthy diet. Do not change your diet before surgery unless your health care provider tells you to do that. Do not drink any alcohol for at least 48 hours before surgery. Talk to your health care provider about doing exercises before your  surgery. Be sure to follow the exercise program given by your health care provider. Doing these exercises in the weeks before your surgery may help reduce pain and improve function after surgery. Recovery planning In the first couple of weeks after surgery, it will be harder for you to do some of your regular activities. You may get tired easily, and you may have limited movement in your leg. To make sure you have all the help you need after your surgery: Plan to have a responsible adult take you home from the hospital. Your health care provider will tell you how many days you can expect to be in the hospital. Cancel all of your work, caregiving, and volunteer responsibilities for at least 4-6 weeks after surgery. Plan to have a responsible adult stay with you both day and night for the first week. This person should be someone you are comfortable with. You may need this person to help you with your exercises and personal care, such as bathing and using the toilet. If you live alone, arrange for someone to take care of your home and pets for the first 4-6 weeks after surgery. You may not be able to drive for 4-6 weeks. Plan to have someone drive you on errands and to appointments. Consider applying for a disability parking permit. To get an application, call your local Department of Motor Vehicles [facility]) or your health care provider's office. Summary Getting prepared before hip replacement surgery can make your recovery easier and more comfortable. Keep all of your preoperative appointments to make sure that you are ready for your surgery. Prepare your home and arrange for help at home. Plan to have a responsible adult take you home from the hospital and stay with you day and night for the first week. This information is not intended to replace advice given to you by your health care provider. Make sure you discuss any questions you have with your health care provider. Document Revised: 01/07/2020  Document Reviewed: 01/07/2020 Elsevier Patient Education  [place].

## 2021-05-11 IMAGING — MR MR HEAD WO/W CM
9 of 11 series · 35 of 48 positions shown · IV contrast (gadavist)
Comparison: PET-CT 05/27/2019.

CLINICAL DATA: 79-year-old female diagnosed with bladder cancer in
March 2019. Chronic headaches which became worse 2 months ago. No
known injury.

EXAM:
MRI HEAD WITHOUT AND WITH CONTRAST
TECHNIQUE: Multiplanar, multiecho pulse sequences of the brain and surrounding
structures were obtained without and with intravenous contrast.
CONTRAST:  7.5mL GADAVIST GADOBUTROL 1 MMOL/ML IV SOLN

[Series 2: T1 · sagittal · 5.0mm · 0.45mm/px · 1 of 25 slices shown]
[im 1/25]
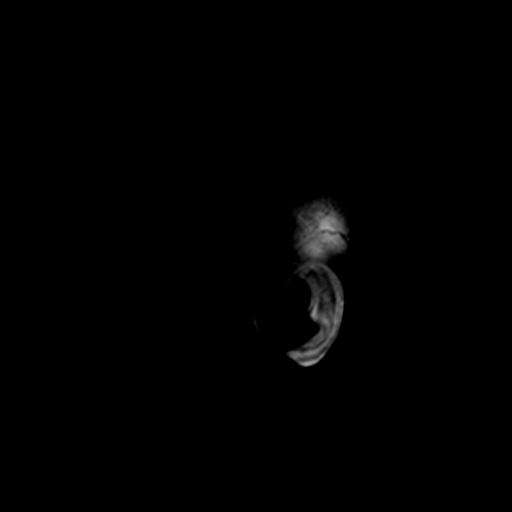

[Series 3: DWI · axial · 3.0mm · 2.19mm/px · z∈[-68,+94]mm · 11 of 100 slices shown (1 of 2)]
[im 1/100]
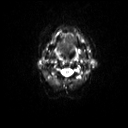
[im 10/100]
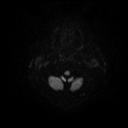
[im 20/100]
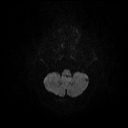
[im 30/100]
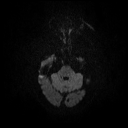
[im 40/100]
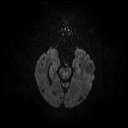
[im 50/100]
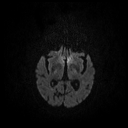
[im 60/100]
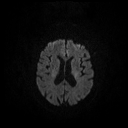
[im 70/100]
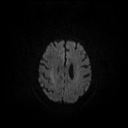
[im 80/100]
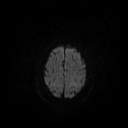
[im 90/100]
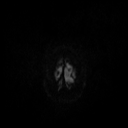
[im 100/100]
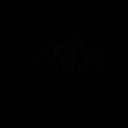

[Series 4: DWI · axial · 3.0mm · 2.19mm/px · z∈[-68,+91]mm · 5 of 49 slices shown (2 of 2)]
[im 1/49]
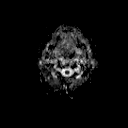
[im 13/49]
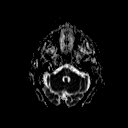
[im 25/49]
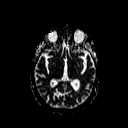
[im 37/49]
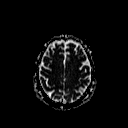
[im 49/49]
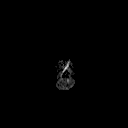

[Series 5: T2 · axial · 5.0mm · 0.45mm/px · z∈[-51,+121]mm · 3 of 26 slices shown (1 of 2)]
[im 1/26]
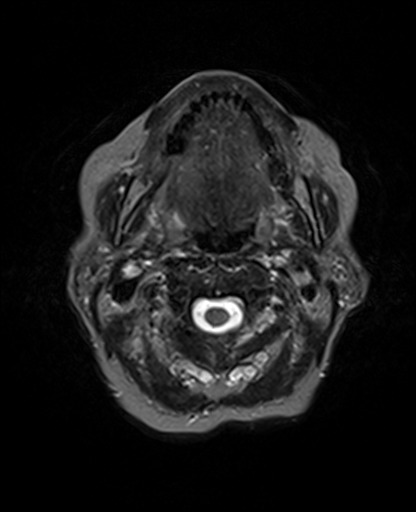
[im 13/26]
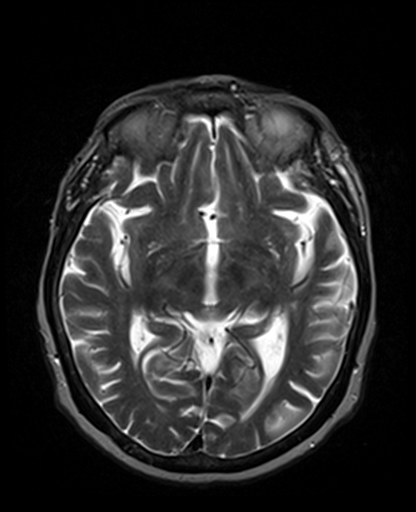
[im 26/26]
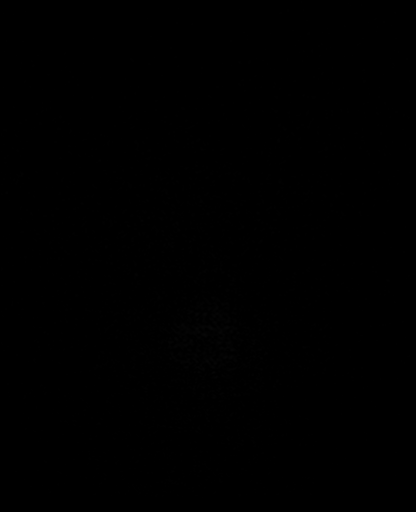

[Series 6: T2 · axial · 5.0mm · 0.45mm/px · z∈[-51,+120]mm · 3 of 26 slices shown (2 of 2)]
[im 1/26]
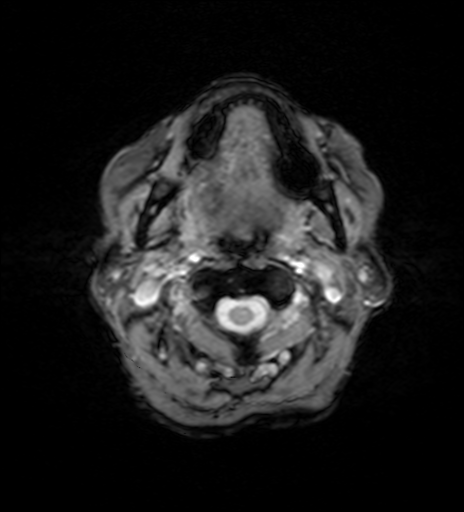
[im 13/26]
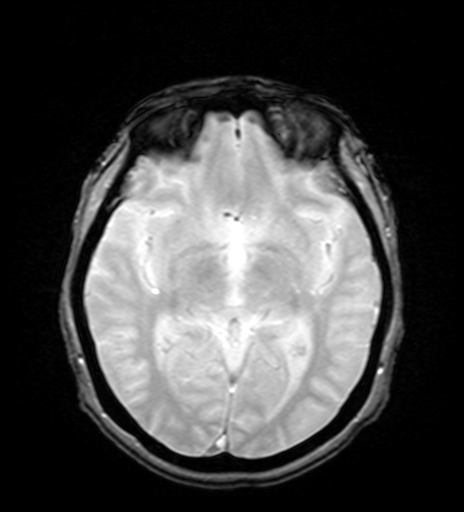
[im 26/26]
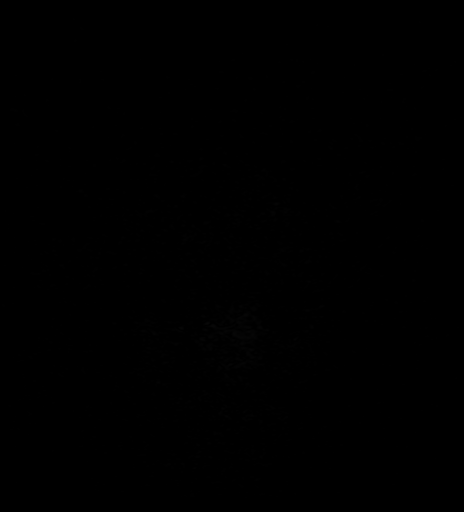

[Series 7: FLAIR · axial · 5.0mm · 0.45mm/px · z∈[-51,+120]mm · 3 of 26 slices shown]
[im 1/26]
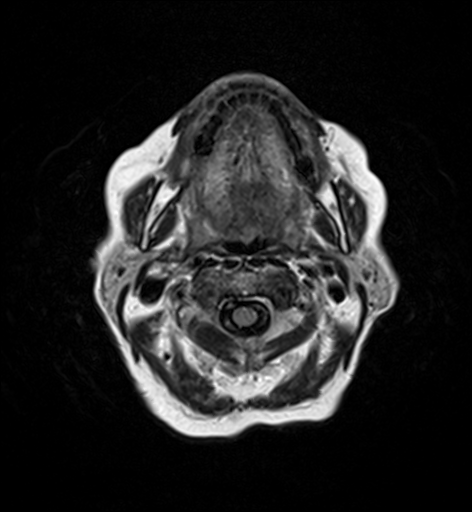
[im 13/26]
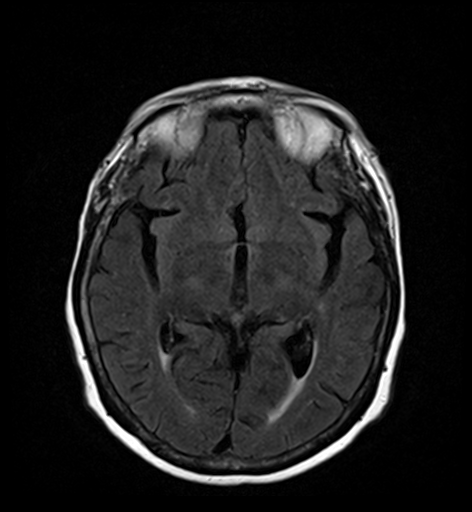
[im 26/26]
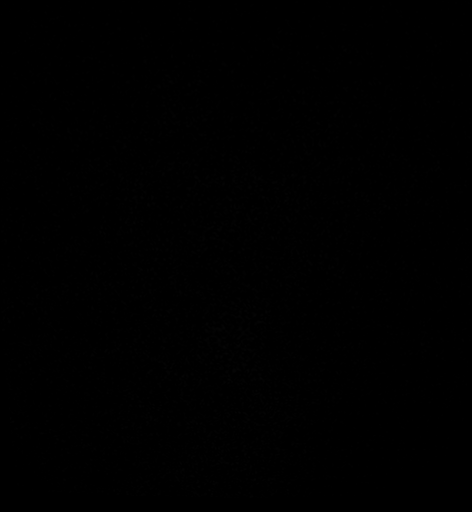

[Series 9: T2 post-contrast · coronal · 5.0mm · 0.45mm/px · 3 of 27 slices shown]
[im 1/27]
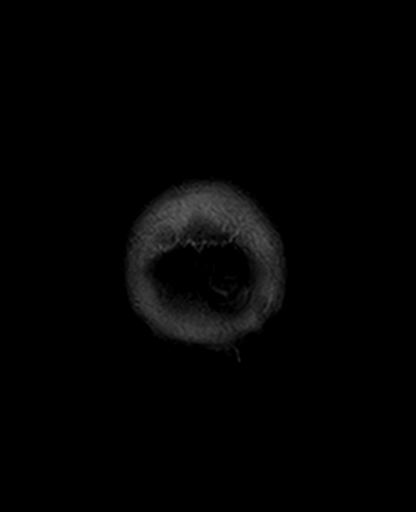
[im 14/27]
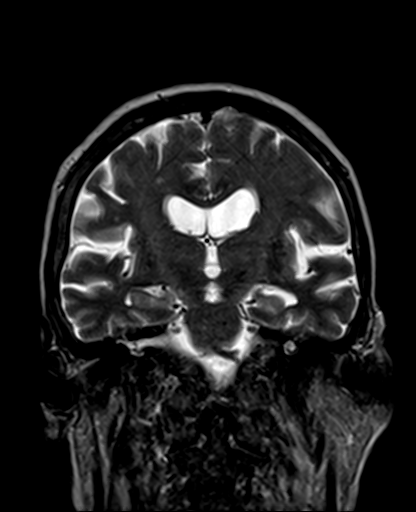
[im 27/27]
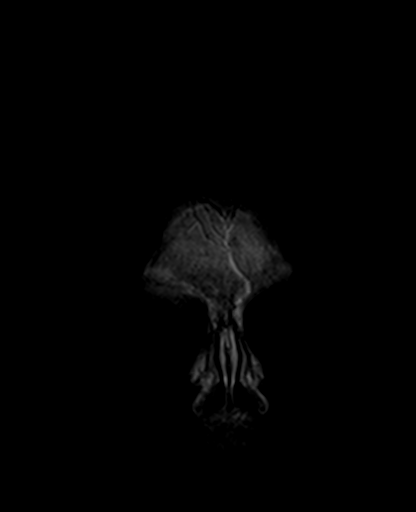

[Series 11: T1 post-contrast · coronal · 5.0mm · 0.45mm/px · 3 of 27 slices shown (1 of 2)]
[im 1/27]
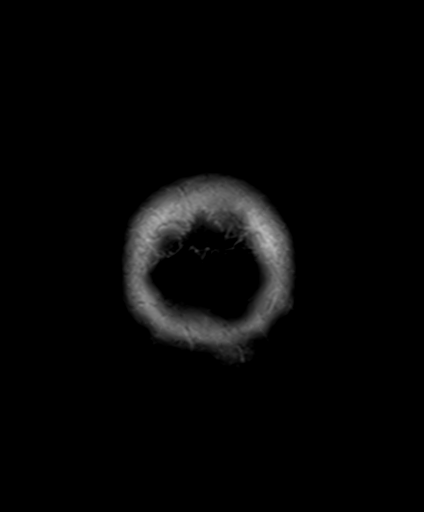
[im 14/27]
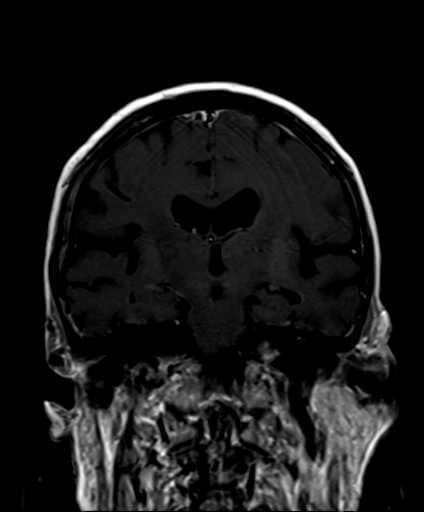
[im 27/27]
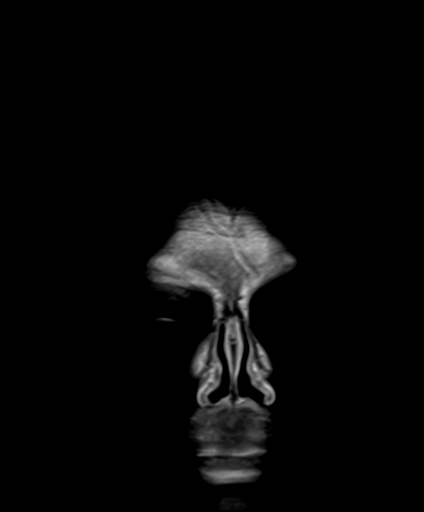

[Series 12: T1 post-contrast · sagittal · 5.0mm · 0.45mm/px · 3 of 25 slices shown (2 of 2)]
[im 1/25]
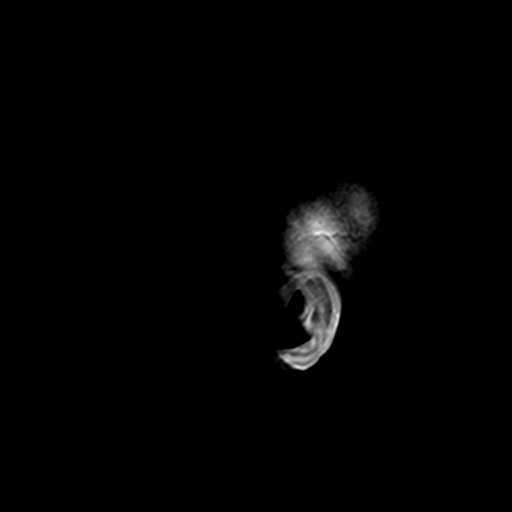
[im 13/25]
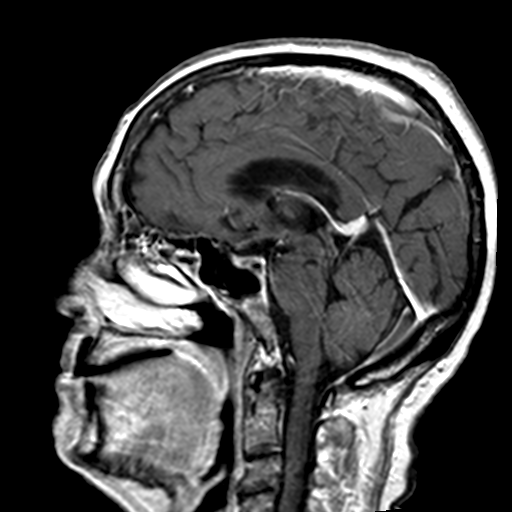
[im 25/25]
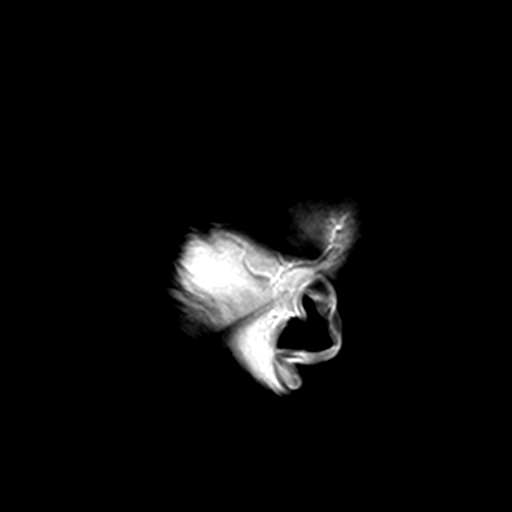

[35 of 48 positions shown; findings below may reference images not displayed]

FINDINGS: Brain: Cerebral volume is within normal limits for age. No
restricted diffusion to suggest acute infarction. No midline shift,
mass effect, evidence of mass lesion, ventriculomegaly, extra-axial
collection or acute intracranial hemorrhage. Cervicomedullary
junction and pituitary are within normal limits.

Scattered periventricular and other mild for age nonspecific
cerebral white matter T2 and FLAIR hyperintensity. No cortical
encephalomalacia or chronic cerebral blood products.

No abnormal enhancement identified. No dural thickening.

Vascular: Major intracranial vascular flow voids are preserved. The
major dural venous sinuses are enhancing and appear to be patent.

Skull and upper cervical spine: Mild for age degenerative changes in
the visible cervical spine. Negative visible spinal cord. Visualized
bone marrow signal is within normal limits.

Sinuses/Orbits: Postoperative changes to both globes. Otherwise
negative orbits. Trace paranasal sinus mucosal thickening.

Other: Mastoids are clear. Visible internal auditory structures
appear normal. Scalp and face soft tissues appear negative.
IMPRESSION: 1. No metastatic disease or acute intracranial abnormality.
2. Mild for age nonspecific cerebral white matter signal changes,
most commonly due to chronic small vessel disease.

## 2021-05-11 IMAGING — MR MR MRV HEAD W/O CM
1 series · 48 of 48 positions shown · non-contrast
Comparison: Brain MRI without and with contrast today reported
separately.

CLINICAL DATA: 79-year-old female diagnosed with bladder cancer in
March 2019. Chronic headaches which became worse 2 months ago. No
known injury.

EXAM:
MR VENOGRAM OF THE HEAD WITHOUT CONTRAST
TECHNIQUE: Angiographic images of the intracranial venous structures were
obtained using MRV technique without intravenous contrast.

[Series 3: tof_2d_veins · coronal · 3.0mm · 0.98mm/px · 48 of 108 slices shown]
[im 1/108]
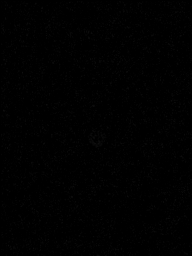
[im 3/108]
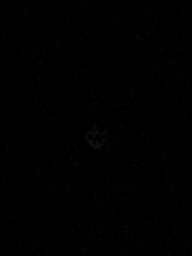
[im 5/108]
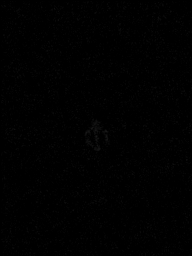
[im 7/108]
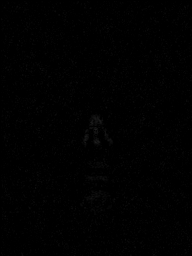
[im 10/108]
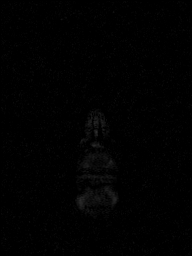
[im 12/108]
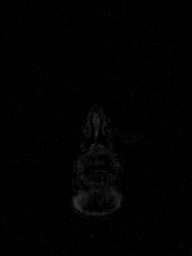
[im 14/108]
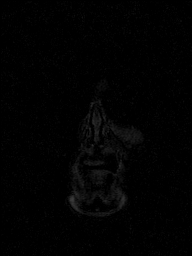
[im 16/108]
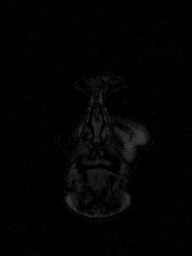
[im 19/108]
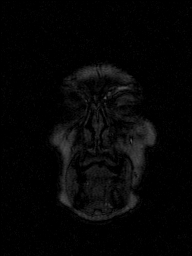
[im 21/108]
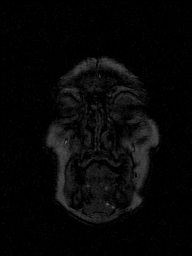
[im 23/108]
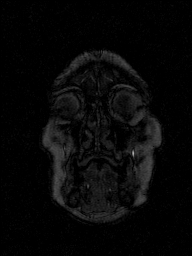
[im 26/108]
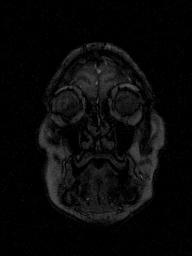
[im 28/108]
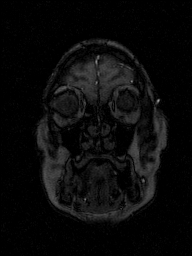
[im 30/108]
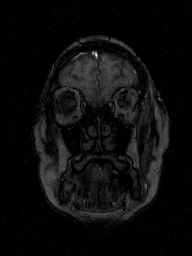
[im 32/108]
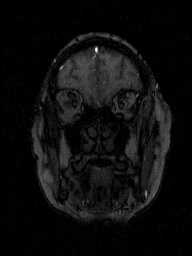
[im 35/108]
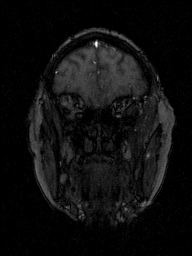
[im 37/108]
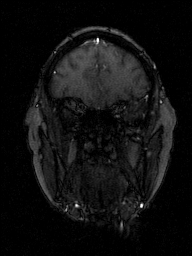
[im 39/108]
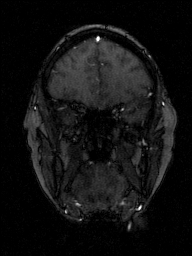
[im 41/108]
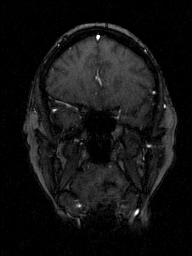
[im 44/108]
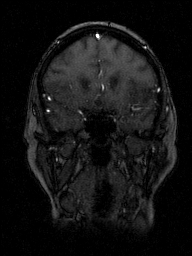
[im 46/108]
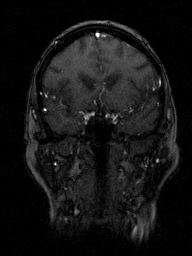
[im 48/108]
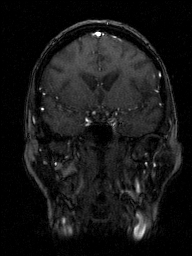
[im 51/108]
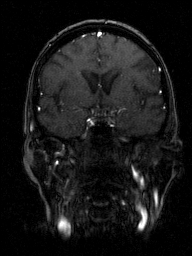
[im 53/108]
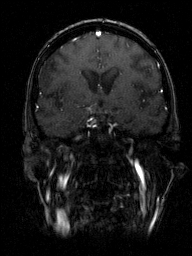
[im 55/108]
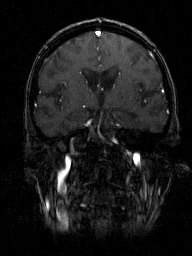
[im 57/108]
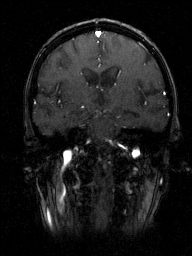
[im 60/108]
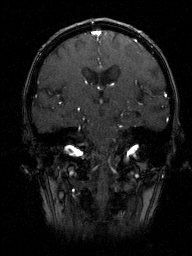
[im 62/108]
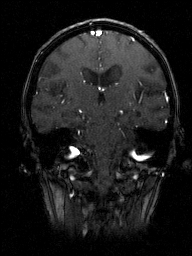
[im 64/108]
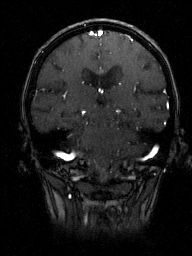
[im 67/108]
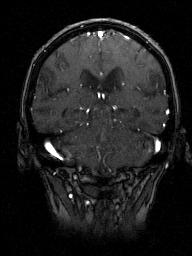
[im 69/108]
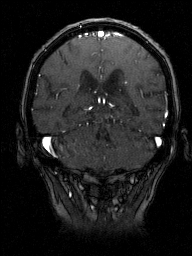
[im 71/108]
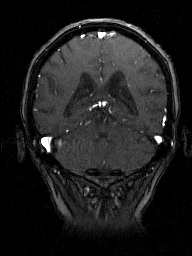
[im 73/108]
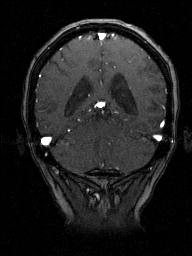
[im 76/108]
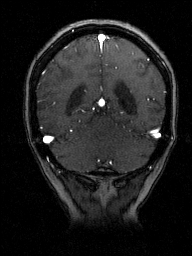
[im 78/108]
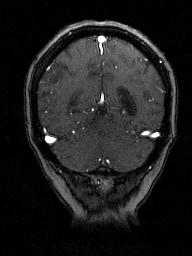
[im 80/108]
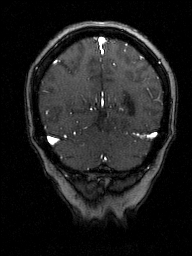
[im 82/108]
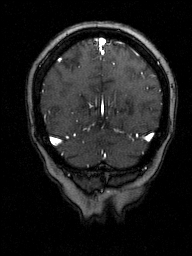
[im 85/108]
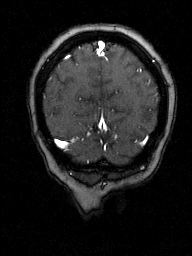
[im 87/108]
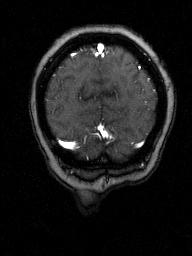
[im 89/108]
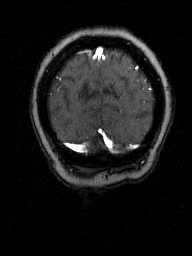
[im 92/108]
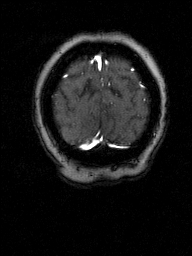
[im 94/108]
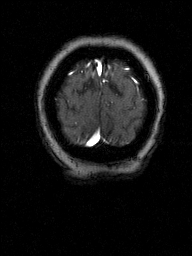
[im 96/108]
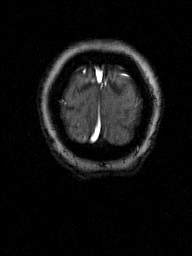
[im 98/108]
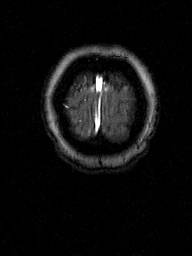
[im 101/108]
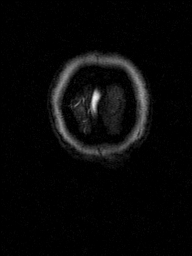
[im 103/108]
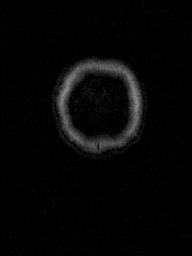
[im 105/108]
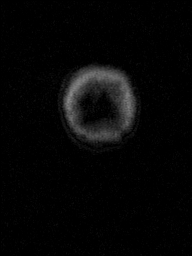
[im 108/108]
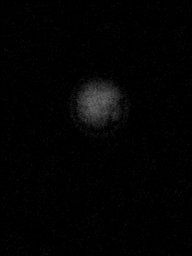

[48 of 48 positions shown; findings below may reference images not displayed]

FINDINGS: Time-of-flight images demonstrate preserved flow signal in the
superior sagittal sinus, the torcula, straight sinus, vein of [place],
internal cerebral veins, basal veins of [place], bilateral
transverse sinuses, bilateral sigmoid sinuses and IJ bulbs. Flow
signal also appears preserved in the major draining cortical veins
including [place] and [place].

On the comparison MRI today the major dural venous sinuses were
normally enhancing.
IMPRESSION: Normal intracranial MRV, negative for dural sinus thrombosis.

## 2021-05-15 DIAGNOSIS — M25552 Pain in left hip: Secondary | ICD-10-CM | POA: Diagnosis not present

## 2021-05-17 DIAGNOSIS — Z6826 Body mass index (BMI) 26.0-26.9, adult: Secondary | ICD-10-CM | POA: Diagnosis not present

## 2021-05-17 DIAGNOSIS — K219 Gastro-esophageal reflux disease without esophagitis: Secondary | ICD-10-CM | POA: Diagnosis not present

## 2021-05-17 DIAGNOSIS — Z936 Other artificial openings of urinary tract status: Secondary | ICD-10-CM | POA: Diagnosis not present

## 2021-05-17 DIAGNOSIS — Z8249 Family history of ischemic heart disease and other diseases of the circulatory system: Secondary | ICD-10-CM | POA: Diagnosis not present

## 2021-05-17 DIAGNOSIS — F419 Anxiety disorder, unspecified: Secondary | ICD-10-CM | POA: Diagnosis not present

## 2021-05-17 DIAGNOSIS — Z803 Family history of malignant neoplasm of breast: Secondary | ICD-10-CM | POA: Diagnosis not present

## 2021-05-17 DIAGNOSIS — Z823 Family history of stroke: Secondary | ICD-10-CM | POA: Diagnosis not present

## 2021-05-17 DIAGNOSIS — F324 Major depressive disorder, single episode, in partial remission: Secondary | ICD-10-CM | POA: Diagnosis not present

## 2021-05-17 DIAGNOSIS — E663 Overweight: Secondary | ICD-10-CM | POA: Diagnosis not present

## 2021-05-23 ENCOUNTER — Ambulatory Visit: Payer: Medicare PPO | Admitting: Family Medicine

## 2021-05-25 DIAGNOSIS — M1612 Unilateral primary osteoarthritis, left hip: Secondary | ICD-10-CM | POA: Diagnosis not present

## 2021-05-29 DIAGNOSIS — M25552 Pain in left hip: Secondary | ICD-10-CM | POA: Diagnosis not present

## 2021-05-29 DIAGNOSIS — R262 Difficulty in walking, not elsewhere classified: Secondary | ICD-10-CM | POA: Diagnosis not present

## 2021-05-29 DIAGNOSIS — M25652 Stiffness of left hip, not elsewhere classified: Secondary | ICD-10-CM | POA: Diagnosis not present

## 2021-05-29 DIAGNOSIS — Z4789 Encounter for other orthopedic aftercare: Secondary | ICD-10-CM | POA: Diagnosis not present

## 2021-05-31 DIAGNOSIS — M25552 Pain in left hip: Secondary | ICD-10-CM | POA: Diagnosis not present

## 2021-05-31 DIAGNOSIS — R262 Difficulty in walking, not elsewhere classified: Secondary | ICD-10-CM | POA: Diagnosis not present

## 2021-05-31 DIAGNOSIS — Z4789 Encounter for other orthopedic aftercare: Secondary | ICD-10-CM | POA: Diagnosis not present

## 2021-05-31 DIAGNOSIS — M25652 Stiffness of left hip, not elsewhere classified: Secondary | ICD-10-CM | POA: Diagnosis not present

## 2021-06-05 DIAGNOSIS — Z471 Aftercare following joint replacement surgery: Secondary | ICD-10-CM | POA: Diagnosis not present

## 2021-06-05 DIAGNOSIS — Z96642 Presence of left artificial hip joint: Secondary | ICD-10-CM | POA: Diagnosis not present

## 2021-06-06 DIAGNOSIS — R262 Difficulty in walking, not elsewhere classified: Secondary | ICD-10-CM | POA: Diagnosis not present

## 2021-06-06 DIAGNOSIS — M25652 Stiffness of left hip, not elsewhere classified: Secondary | ICD-10-CM | POA: Diagnosis not present

## 2021-06-06 DIAGNOSIS — M25552 Pain in left hip: Secondary | ICD-10-CM | POA: Diagnosis not present

## 2021-06-06 DIAGNOSIS — Z4789 Encounter for other orthopedic aftercare: Secondary | ICD-10-CM | POA: Diagnosis not present

## 2021-06-09 DIAGNOSIS — M25552 Pain in left hip: Secondary | ICD-10-CM | POA: Diagnosis not present

## 2021-06-09 DIAGNOSIS — M25652 Stiffness of left hip, not elsewhere classified: Secondary | ICD-10-CM | POA: Diagnosis not present

## 2021-06-09 DIAGNOSIS — Z4789 Encounter for other orthopedic aftercare: Secondary | ICD-10-CM | POA: Diagnosis not present

## 2021-06-09 DIAGNOSIS — R262 Difficulty in walking, not elsewhere classified: Secondary | ICD-10-CM | POA: Diagnosis not present

## 2021-06-13 DIAGNOSIS — R262 Difficulty in walking, not elsewhere classified: Secondary | ICD-10-CM | POA: Diagnosis not present

## 2021-06-13 DIAGNOSIS — M25652 Stiffness of left hip, not elsewhere classified: Secondary | ICD-10-CM | POA: Diagnosis not present

## 2021-06-13 DIAGNOSIS — Z4789 Encounter for other orthopedic aftercare: Secondary | ICD-10-CM | POA: Diagnosis not present

## 2021-06-13 DIAGNOSIS — M25552 Pain in left hip: Secondary | ICD-10-CM | POA: Diagnosis not present

## 2021-06-16 DIAGNOSIS — R262 Difficulty in walking, not elsewhere classified: Secondary | ICD-10-CM | POA: Diagnosis not present

## 2021-06-16 DIAGNOSIS — Z4789 Encounter for other orthopedic aftercare: Secondary | ICD-10-CM | POA: Diagnosis not present

## 2021-06-16 DIAGNOSIS — M25552 Pain in left hip: Secondary | ICD-10-CM | POA: Diagnosis not present

## 2021-06-16 DIAGNOSIS — M25652 Stiffness of left hip, not elsewhere classified: Secondary | ICD-10-CM | POA: Diagnosis not present

## 2021-06-20 DIAGNOSIS — Z4789 Encounter for other orthopedic aftercare: Secondary | ICD-10-CM | POA: Diagnosis not present

## 2021-06-20 DIAGNOSIS — M25552 Pain in left hip: Secondary | ICD-10-CM | POA: Diagnosis not present

## 2021-06-20 DIAGNOSIS — R262 Difficulty in walking, not elsewhere classified: Secondary | ICD-10-CM | POA: Diagnosis not present

## 2021-06-20 DIAGNOSIS — M25652 Stiffness of left hip, not elsewhere classified: Secondary | ICD-10-CM | POA: Diagnosis not present

## 2021-06-22 DIAGNOSIS — M25652 Stiffness of left hip, not elsewhere classified: Secondary | ICD-10-CM | POA: Diagnosis not present

## 2021-06-22 DIAGNOSIS — Z4789 Encounter for other orthopedic aftercare: Secondary | ICD-10-CM | POA: Diagnosis not present

## 2021-06-22 DIAGNOSIS — R262 Difficulty in walking, not elsewhere classified: Secondary | ICD-10-CM | POA: Diagnosis not present

## 2021-06-22 DIAGNOSIS — M25552 Pain in left hip: Secondary | ICD-10-CM | POA: Diagnosis not present

## 2021-06-27 DIAGNOSIS — M25652 Stiffness of left hip, not elsewhere classified: Secondary | ICD-10-CM | POA: Diagnosis not present

## 2021-06-27 DIAGNOSIS — R262 Difficulty in walking, not elsewhere classified: Secondary | ICD-10-CM | POA: Diagnosis not present

## 2021-06-27 DIAGNOSIS — M25552 Pain in left hip: Secondary | ICD-10-CM | POA: Diagnosis not present

## 2021-06-27 DIAGNOSIS — Z4789 Encounter for other orthopedic aftercare: Secondary | ICD-10-CM | POA: Diagnosis not present

## 2021-06-30 ENCOUNTER — Encounter: Payer: Self-pay | Admitting: Hematology & Oncology

## 2021-06-30 ENCOUNTER — Other Ambulatory Visit: Payer: Self-pay

## 2021-06-30 ENCOUNTER — Inpatient Hospital Stay: Payer: Medicare PPO | Attending: Hematology & Oncology

## 2021-06-30 ENCOUNTER — Inpatient Hospital Stay: Payer: Medicare PPO | Admitting: Hematology & Oncology

## 2021-06-30 ENCOUNTER — Encounter (HOSPITAL_BASED_OUTPATIENT_CLINIC_OR_DEPARTMENT_OTHER): Payer: Self-pay

## 2021-06-30 ENCOUNTER — Ambulatory Visit (HOSPITAL_BASED_OUTPATIENT_CLINIC_OR_DEPARTMENT_OTHER)
Admission: RE | Admit: 2021-06-30 | Discharge: 2021-06-30 | Disposition: A | Discharge status: Home / Self Care | Payer: Medicare PPO | Source: Ambulatory Visit | Attending: Hematology & Oncology | Admitting: Hematology & Oncology

## 2021-06-30 VITALS — BP 161/67 | HR 71 | Temp 97.8°F | Resp 20 | Wt 161.0 lb

## 2021-06-30 DIAGNOSIS — K429 Umbilical hernia without obstruction or gangrene: Secondary | ICD-10-CM | POA: Diagnosis not present

## 2021-06-30 DIAGNOSIS — I2693 Single subsegmental pulmonary embolism without acute cor pulmonale: Secondary | ICD-10-CM | POA: Insufficient documentation

## 2021-06-30 DIAGNOSIS — C671 Malignant neoplasm of dome of bladder: Secondary | ICD-10-CM

## 2021-06-30 DIAGNOSIS — C679 Malignant neoplasm of bladder, unspecified: Secondary | ICD-10-CM | POA: Diagnosis not present

## 2021-06-30 DIAGNOSIS — I2699 Other pulmonary embolism without acute cor pulmonale: Secondary | ICD-10-CM | POA: Diagnosis not present

## 2021-06-30 DIAGNOSIS — K5790 Diverticulosis of intestine, part unspecified, without perforation or abscess without bleeding: Secondary | ICD-10-CM | POA: Diagnosis not present

## 2021-06-30 DIAGNOSIS — Z5111 Encounter for antineoplastic chemotherapy: Secondary | ICD-10-CM | POA: Diagnosis not present

## 2021-06-30 DIAGNOSIS — I700 Atherosclerosis of aorta: Secondary | ICD-10-CM | POA: Diagnosis not present

## 2021-06-30 LAB — CBC WITH DIFFERENTIAL (CANCER CENTER ONLY)
Abs Immature Granulocytes: 0.02 10*3/uL (ref 0.00–0.07)
Basophils Absolute: 0 10*3/uL (ref 0.0–0.1)
Basophils Relative: 1 %
Eosinophils Absolute: 0.1 10*3/uL (ref 0.0–0.5)
Eosinophils Relative: 2 %
HCT: 38.7 % (ref 36.0–46.0)
Hemoglobin: 12.3 g/dL (ref 12.0–15.0)
Immature Granulocytes: 0 %
Lymphocytes Relative: 38 %
Lymphs Abs: 1.8 10*3/uL (ref 0.7–4.0)
MCH: 32.6 pg (ref 26.0–34.0)
MCHC: 31.8 g/dL (ref 30.0–36.0)
MCV: 102.7 fL — ABNORMAL HIGH (ref 80.0–100.0)
Monocytes Absolute: 0.4 10*3/uL (ref 0.1–1.0)
Monocytes Relative: 9 %
Neutro Abs: 2.4 10*3/uL (ref 1.7–7.7)
Neutrophils Relative %: 50 %
Platelet Count: 223 10*3/uL (ref 150–400)
RBC: 3.77 MIL/uL — ABNORMAL LOW (ref 3.87–5.11)
RDW: 12.7 % (ref 11.5–15.5)
WBC Count: 4.7 10*3/uL (ref 4.0–10.5)
nRBC: 0 % (ref 0.0–0.2)

## 2021-06-30 LAB — CMP (CANCER CENTER ONLY)
ALT: 6 U/L (ref 0–44)
AST: 14 U/L — ABNORMAL LOW (ref 15–41)
Albumin: 4.1 g/dL (ref 3.5–5.0)
Alkaline Phosphatase: 104 U/L (ref 38–126)
Anion gap: 7 (ref 5–15)
BUN: 14 mg/dL (ref 8–23)
CO2: 30 mmol/L (ref 22–32)
Calcium: 11 mg/dL — ABNORMAL HIGH (ref 8.9–10.3)
Chloride: 104 mmol/L (ref 98–111)
Creatinine: 1.08 mg/dL — ABNORMAL HIGH (ref 0.44–1.00)
GFR, Estimated: 52 mL/min — ABNORMAL LOW (ref >60)
Glucose, Bld: 111 mg/dL — ABNORMAL HIGH (ref 70–99)
Potassium: 4.6 mmol/L (ref 3.5–5.1)
Sodium: 141 mmol/L (ref 135–145)
Total Bilirubin: 0.7 mg/dL (ref 0.3–1.2)
Total Protein: 6.7 g/dL (ref 6.5–8.1)

## 2021-06-30 LAB — LACTATE DEHYDROGENASE: LDH: 131 U/L (ref 98–192)

## 2021-06-30 MED ORDER — IOHEXOL 300 MG/ML  SOLN
100.0000 mL | Freq: Once | INTRAMUSCULAR | Status: AC | PRN
  Administered 2021-06-30: 100 mL via INTRAVENOUS

## 2021-06-30 MED ORDER — RIVAROXABAN (XARELTO) VTE STARTER PACK (15 & 20 MG): ORAL_TABLET | ORAL | 0 refills | Status: DC

## 2021-06-30 NOTE — Progress Notes (Signed)
Hematology and Oncology Follow Up Visit  Christina [name] [id number] 13-Jun-1941 80 y.o. 06/30/2021   Principle Diagnosis:  Stage IIIB (T1N3M0) invasive urothelial carcinoma of the bladder  Subsegmental blood clot in right lower lung  Current Therapy:   Status post neoadjuvant chemotherapy with ddMVAC Radical cystectomy on 03/03/2020  Xarelto 20 mg p.o. daily-6 months of therapy to start on 06/30/2021     Interim History:  Christina [name] is back for follow-up.  Surprise enough, we found that Christina [name] now has a blood clot in Christina [name] lung.  Christina [name] had a CT scan done for surveillance for Christina [name] bladder cancer.  There is no evidence of cancer recurrence.  However, Christina [name] was found to have a subsegmental blood clot in the right lower lung..  Christina [name] is totally asymptomatic.  Of interest is that Christina [name] had hip surgery for the left hip 4 weeks ago.  I have to believe that there might be a blood clot in Christina [name] leg.  We will get Dopplers of Christina [name] leg today.  Christina [name] has not noted any leg swelling.  Is been no leg pain.  Christina [name] says sometimes at night, there is some pain in the left leg.  This is where Christina [name] had Christina [name] hip surgery  Otherwise Christina [name] is doing nicely.  Christina [name] has had no problems with nausea or vomiting.  Christina [name] has had no chest wall pain.  There is been no fever.  Christina [name] has had no issues with COVID.  Christina [name] has had a good summer.  Christina [name] had no problems over the summertime.  Currently, I would say performance status is ECOG 1.     Medications:  Current Outpatient Medications:    LORazepam (ATIVAN) 0.5 MG tablet, Take 1 tablet (0.5 mg total) by mouth 2 (two) times daily as needed for anxiety., Disp: 60 tablet, Rfl: 5   magnesium oxide (MAG-OX) 400 MG tablet, Take by mouth daily., Disp: , Rfl:    PARoxetine (PAXIL) 20 MG tablet, Take 1 tablet (20 mg total) by mouth daily., Disp: 90 tablet, Rfl: 1   Potassium Chloride (KLOR-CON PO), Take 40 mEq by mouth daily., Disp: , Rfl:    VITAMIN D PO, Take by mouth., Disp: , Rfl:   Allergies:   Allergies  Allergen Reactions   Nutrasweet Aspartame [Aspartame] Diarrhea   Lisinopril Cough    Past Medical History, Surgical history, Social history, and Family History were reviewed and updated.  Review of Systems: Review of Systems  Constitutional: Negative.   HENT:  Negative.    Eyes: Negative.   Respiratory: Negative.    Cardiovascular: Negative.   Gastrointestinal: Negative.   Endocrine: Negative.   Genitourinary: Negative.    Musculoskeletal: Negative.   Skin: Negative.   Neurological: Negative.   Hematological: Negative.   Psychiatric/Behavioral: Negative.     Physical Exam:  weight is 161 lb (73 kg). Christina [name] oral temperature is 97.8 F (36.6 C). Christina [name] blood pressure is 161/67 (abnormal) and Christina [name] pulse is 71. Christina [name] respiration is 20 and oxygen saturation is 98%.   Wt Readings from Last 3 Encounters:  06/30/21 161 lb (73 kg)  05/09/21 161 lb 3.2 oz (73.1 kg)  04/25/21 161 lb (73 kg)    Physical Exam Vitals reviewed.  HENT:     Head: Normocephalic and atraumatic.  Eyes:     Pupils: Pupils are equal, round, and reactive to light.  Cardiovascular:     Rate and Rhythm: Normal rate and regular rhythm.     Heart sounds: Normal heart sounds.  Pulmonary:     Effort: Pulmonary effort is normal.     Breath sounds: Normal breath sounds.  Abdominal:     General: Bowel sounds are normal.     Palpations: Abdomen is soft.     Comments: Abdominal exam shows the healed laparotomy scar in the midline.  Christina [name] has some firmness in the middle of the laparotomy scar.  Christina [name] has the urostomy bag which is well-positioned.  Urine is clear.  There is no fluid wave in the abdomen.  Bowel sounds are present.  Christina [name] has no palpable liver or spleen tip.  Musculoskeletal:        General: No tenderness or deformity. Normal range of motion.     Cervical back: Normal range of motion.  Lymphadenopathy:     Cervical: No cervical adenopathy.  Skin:    General: Skin is warm and dry.     Findings: No  erythema or rash.  Neurological:     Mental Status: Christina [name] is alert and oriented to person, place, and time.  Psychiatric:        Behavior: Behavior normal.        Thought Content: Thought content normal.        Judgment: Judgment normal.    Lab Results  Component Value Date   WBC 4.7 06/30/2021   HGB 12.3 06/30/2021   HCT 38.7 06/30/2021   MCV 102.7 (H) 06/30/2021   PLT 223 06/30/2021     Chemistry      Component Value Date/Time   NA 141 06/30/2021 1001   NA 142 03/04/2017 1016   K 4.6 06/30/2021 1001   CL 104 06/30/2021 1001   CO2 30 06/30/2021 1001   BUN 14 06/30/2021 1001   BUN 13 03/04/2017 1016   CREATININE 1.08 (H) 06/30/2021 1001   CREATININE 0.78 05/20/2015 1704      Component Value Date/Time   CALCIUM 11.0 (H) 06/30/2021 1001   ALKPHOS 104 06/30/2021 1001   AST 14 (L) 06/30/2021 1001   ALT 6 06/30/2021 1001   BILITOT 0.7 06/30/2021 1001      Impression and Plan: Christina [name] is a 80 year old white female.  Christina [name] had superficial bladder cancer.  Christina [name] was treated with intravesicular therapy.  However, Christina [name] has had had muscle invasive cancer which was poorly differentiated.  Christina [name] then underwent neoadjuvant chemotherapy followed by radical cystectomy.  Christina [name] is found to have stage IIIB disease.  Christina [name] definitely has a high risk of recurrence.  Christina [name] needs to be followed with CAT scans every 3 months.   I am so glad that we did a CT scan on Christina [name].  In this way, we had found the blood clot before it became symptomatic with Christina [name].  We will get the Dopplers of Christina [name] lower legs today.  We will put Christina [name] on Xarelto.  I will start Christina [name] on the starter pack of Xarelto.  Christina [name] 15 mg p.o. twice daily for 21 days.  I will then put Christina [name] on 20 mg a day for 6 months therapeutic and then 6 months for maintenance at 10 mg daily.  Again I am not sure as to why Christina [name] would have the blood clot outside of the hip surgery.  That is the 1 factor that I can think of.  I am not going to run hypercoagulable studies  on Christina [name] given Christina [name] age.  We will have Christina [name] come back to see [place] in about 3 or 4 weeks just to make sure Christina [name] is doing okay.  I would not plan for another CT of Christina [name] chest, which will be a CT angiogram for another couple months.    Volanda Napoleon, MD 11/4/202212:48 PM

## 2021-07-12 DIAGNOSIS — C44622 Squamous cell carcinoma of skin of right upper limb, including shoulder: Secondary | ICD-10-CM | POA: Diagnosis not present

## 2021-07-12 DIAGNOSIS — L814 Other melanin hyperpigmentation: Secondary | ICD-10-CM | POA: Diagnosis not present

## 2021-07-12 DIAGNOSIS — X32XXXS Exposure to sunlight, sequela: Secondary | ICD-10-CM | POA: Diagnosis not present

## 2021-07-12 DIAGNOSIS — D1801 Hemangioma of skin and subcutaneous tissue: Secondary | ICD-10-CM | POA: Diagnosis not present

## 2021-07-24 ENCOUNTER — Other Ambulatory Visit: Payer: Self-pay | Admitting: *Deleted

## 2021-07-24 MED ORDER — RIVAROXABAN 20 MG PO TABS: 20.0000 mg | ORAL_TABLET | Freq: Every day | ORAL | 5 refills | Status: DC

## 2021-07-31 DIAGNOSIS — H40013 Open angle with borderline findings, low risk, bilateral: Secondary | ICD-10-CM | POA: Diagnosis not present

## 2021-07-31 DIAGNOSIS — H04123 Dry eye syndrome of bilateral lacrimal glands: Secondary | ICD-10-CM | POA: Diagnosis not present

## 2021-07-31 DIAGNOSIS — H5213 Myopia, bilateral: Secondary | ICD-10-CM | POA: Diagnosis not present

## 2021-07-31 DIAGNOSIS — H43813 Vitreous degeneration, bilateral: Secondary | ICD-10-CM | POA: Diagnosis not present

## 2021-07-31 DIAGNOSIS — Z961 Presence of intraocular lens: Secondary | ICD-10-CM | POA: Diagnosis not present

## 2021-08-01 DIAGNOSIS — K5904 Chronic idiopathic constipation: Secondary | ICD-10-CM | POA: Diagnosis not present

## 2021-08-01 DIAGNOSIS — R140 Abdominal distension (gaseous): Secondary | ICD-10-CM | POA: Diagnosis not present

## 2021-08-01 DIAGNOSIS — K602 Anal fissure, unspecified: Secondary | ICD-10-CM | POA: Diagnosis not present

## 2021-08-01 DIAGNOSIS — K625 Hemorrhage of anus and rectum: Secondary | ICD-10-CM | POA: Diagnosis not present

## 2021-08-01 DIAGNOSIS — Z1211 Encounter for screening for malignant neoplasm of colon: Secondary | ICD-10-CM | POA: Diagnosis not present

## 2021-08-03 ENCOUNTER — Other Ambulatory Visit: Payer: Self-pay

## 2021-08-03 ENCOUNTER — Encounter: Payer: Self-pay | Admitting: Hematology & Oncology

## 2021-08-03 ENCOUNTER — Telehealth: Payer: Self-pay | Admitting: *Deleted

## 2021-08-03 ENCOUNTER — Inpatient Hospital Stay: Payer: Medicare PPO | Admitting: Hematology & Oncology

## 2021-08-03 ENCOUNTER — Ambulatory Visit (HOSPITAL_BASED_OUTPATIENT_CLINIC_OR_DEPARTMENT_OTHER)
Admission: RE | Admit: 2021-08-03 | Discharge: 2021-08-03 | Disposition: A | Discharge status: Home / Self Care | Payer: Medicare PPO | Source: Ambulatory Visit | Attending: Hematology & Oncology | Admitting: Hematology & Oncology

## 2021-08-03 ENCOUNTER — Inpatient Hospital Stay: Payer: Medicare PPO | Attending: Hematology & Oncology

## 2021-08-03 VITALS — BP 148/57 | HR 75 | Temp 98.4°F | Resp 18 | Wt 162.0 lb

## 2021-08-03 DIAGNOSIS — I2609 Other pulmonary embolism with acute cor pulmonale: Secondary | ICD-10-CM

## 2021-08-03 DIAGNOSIS — Z79899 Other long term (current) drug therapy: Secondary | ICD-10-CM | POA: Insufficient documentation

## 2021-08-03 DIAGNOSIS — C679 Malignant neoplasm of bladder, unspecified: Secondary | ICD-10-CM | POA: Insufficient documentation

## 2021-08-03 DIAGNOSIS — R079 Chest pain, unspecified: Secondary | ICD-10-CM

## 2021-08-03 DIAGNOSIS — R911 Solitary pulmonary nodule: Secondary | ICD-10-CM | POA: Diagnosis not present

## 2021-08-03 DIAGNOSIS — K922 Gastrointestinal hemorrhage, unspecified: Secondary | ICD-10-CM | POA: Insufficient documentation

## 2021-08-03 DIAGNOSIS — Z86711 Personal history of pulmonary embolism: Secondary | ICD-10-CM | POA: Insufficient documentation

## 2021-08-03 DIAGNOSIS — I700 Atherosclerosis of aorta: Secondary | ICD-10-CM | POA: Diagnosis not present

## 2021-08-03 DIAGNOSIS — Z9221 Personal history of antineoplastic chemotherapy: Secondary | ICD-10-CM | POA: Insufficient documentation

## 2021-08-03 DIAGNOSIS — Z7901 Long term (current) use of anticoagulants: Secondary | ICD-10-CM | POA: Insufficient documentation

## 2021-08-03 DIAGNOSIS — Z936 Other artificial openings of urinary tract status: Secondary | ICD-10-CM

## 2021-08-03 DIAGNOSIS — I2693 Single subsegmental pulmonary embolism without acute cor pulmonale: Secondary | ICD-10-CM

## 2021-08-03 LAB — CBC WITH DIFFERENTIAL (CANCER CENTER ONLY)
Abs Immature Granulocytes: 0.01 10*3/uL (ref 0.00–0.07)
Basophils Absolute: 0 10*3/uL (ref 0.0–0.1)
Basophils Relative: 1 %
Eosinophils Absolute: 0.1 10*3/uL (ref 0.0–0.5)
Eosinophils Relative: 1 %
HCT: 38.7 % (ref 36.0–46.0)
Hemoglobin: 12.5 g/dL (ref 12.0–15.0)
Immature Granulocytes: 0 %
Lymphocytes Relative: 36 %
Lymphs Abs: 1.9 10*3/uL (ref 0.7–4.0)
MCH: 32.7 pg (ref 26.0–34.0)
MCHC: 32.3 g/dL (ref 30.0–36.0)
MCV: 101.3 fL — ABNORMAL HIGH (ref 80.0–100.0)
Monocytes Absolute: 0.4 10*3/uL (ref 0.1–1.0)
Monocytes Relative: 8 %
Neutro Abs: 2.9 10*3/uL (ref 1.7–7.7)
Neutrophils Relative %: 54 %
Platelet Count: 217 10*3/uL (ref 150–400)
RBC: 3.82 MIL/uL — ABNORMAL LOW (ref 3.87–5.11)
RDW: 12.8 % (ref 11.5–15.5)
WBC Count: 5.4 10*3/uL (ref 4.0–10.5)
nRBC: 0 % (ref 0.0–0.2)

## 2021-08-03 LAB — CMP (CANCER CENTER ONLY)
ALT: 7 U/L (ref 0–44)
AST: 14 U/L — ABNORMAL LOW (ref 15–41)
Albumin: 4.1 g/dL (ref 3.5–5.0)
Alkaline Phosphatase: 79 U/L (ref 38–126)
Anion gap: 5 (ref 5–15)
BUN: 17 mg/dL (ref 8–23)
CO2: 29 mmol/L (ref 22–32)
Calcium: 10.5 mg/dL — ABNORMAL HIGH (ref 8.9–10.3)
Chloride: 104 mmol/L (ref 98–111)
Creatinine: 1.01 mg/dL — ABNORMAL HIGH (ref 0.44–1.00)
GFR, Estimated: 57 mL/min — ABNORMAL LOW (ref >60)
Glucose, Bld: 103 mg/dL — ABNORMAL HIGH (ref 70–99)
Potassium: 4.2 mmol/L (ref 3.5–5.1)
Sodium: 138 mmol/L (ref 135–145)
Total Bilirubin: 0.8 mg/dL (ref 0.3–1.2)
Total Protein: 6.4 g/dL — ABNORMAL LOW (ref 6.5–8.1)

## 2021-08-03 LAB — D-DIMER, QUANTITATIVE: D-Dimer, Quant: 0.53 ug/mL-FEU — ABNORMAL HIGH (ref 0.00–0.50)

## 2021-08-03 MED ORDER — IOHEXOL 350 MG/ML SOLN
100.0000 mL | Freq: Once | INTRAVENOUS | Status: AC | PRN
  Administered 2021-08-03: 100 mL via INTRAVENOUS

## 2021-08-03 NOTE — Telephone Encounter (Signed)
Pt notified per order of Dr. Marin Olp that the [place] shows that there is "still a little bit of blood clot in her lung, to continue to hold Xarelto and restart it on Monday, 08/07/21 and to not go on her trip this weekend with her church group."  Teach back done.  Pt is appreciative of call and has no questions at this time.

## 2021-08-03 NOTE — Progress Notes (Signed)
Hematology and Oncology Follow Up Visit  Christina [name] [id number] Apr 22, 1941 80 y.o. 08/03/2021   Principle Diagnosis:  Stage IIIB (T1N3M0) invasive urothelial carcinoma of the bladder  Subsegmental blood clot in right lower lung  Current Therapy:   Status post neoadjuvant chemotherapy with ddMVAC Radical cystectomy on 03/03/2020  Xarelto 20 mg p.o. daily-6 months of therapy to start on 06/30/2021     Interim History:  Christina [name] is back for follow-up.  Unfortunately, she now has had some issues with rectal bleeding.  She has seen upon Christina [name] [facility] G[facility].  She thought that this  Had a rectal tear.  She is now off the Xarelto.  She stopped this couple days ago.  Is been no bleeding.  We will go ahead and do a CT angiogram of the chest to see if the pulmonary embolus is gone.  We did do the CT angiogram.  She still has some residual thrombus in her lung.  She has been on Xarelto for about a month now.  I would like to think that I will be gone.  I told her to hold the Xarelto through the weekend and then restart on Monday.  Otherwise, she is doing okay.  She has had no abdominal pain.  Has been no problems with her bladder.  She has had no hematuria.  She has had no cough or shortness of breath.  Is been no leg swelling.  She has had no rashes.  Has been no fever.  Overall, I would say her performance status is ECOG 1.     Medications:  Current Outpatient Medications:    LORazepam (ATIVAN) 0.5 MG tablet, Take 1 tablet (0.5 mg total) by mouth 2 (two) times daily as needed for anxiety., Disp: 60 tablet, Rfl: 5   magnesium oxide (MAG-OX) 400 MG tablet, Take by mouth daily., Disp: , Rfl:    PARoxetine (PAXIL) 20 MG tablet, Take 1 tablet (20 mg total) by mouth daily., Disp: 90 tablet, Rfl: 1   Potassium Chloride (KLOR-CON PO), Take 40 mEq by mouth daily., Disp: , Rfl:    rivaroxaban (XARELTO) 20 MG TABS tablet, Take 1 tablet (20 mg total) by mouth daily with supper., Disp:  30 tablet, Rfl: 5   RIVAROXABAN (XARELTO) VTE STARTER PACK (15 & 20 MG), Follow package directions: Take one 15mg  tablet by mouth twice a day. On day 22, switch to one 20mg  tablet once a day. Take with food., Disp: 51 each, Rfl: 0   VITAMIN D PO, Take by mouth., Disp: , Rfl:   Allergies:  Allergies  Allergen Reactions   Nutrasweet Aspartame [Aspartame] Diarrhea   Lisinopril Cough    Past Medical History, Surgical history, Social history, and Family History were reviewed and updated.  Review of Systems: Review of Systems  Constitutional: Negative.   HENT:  Negative.    Eyes: Negative.   Respiratory: Negative.    Cardiovascular: Negative.   Gastrointestinal: Negative.   Endocrine: Negative.   Genitourinary: Negative.    Musculoskeletal: Negative.   Skin: Negative.   Neurological: Negative.   Hematological: Negative.   Psychiatric/Behavioral: Negative.     Physical Exam:  weight is 162 lb (73.5 kg). Her oral temperature is 98.4 F (36.9 C). Her blood pressure is 148/57 (abnormal) and her pulse is 75. Her respiration is 18 and oxygen saturation is 98%.   Wt Readings from Last 3 Encounters:  08/03/21 162 lb (73.5 kg)  06/30/21 161 lb (73 kg)  05/09/21 161 lb 3.2  oz (73.1 kg)    Physical Exam Vitals reviewed.  HENT:     Head: Normocephalic and atraumatic.  Eyes:     Pupils: Pupils are equal, round, and reactive to light.  Cardiovascular:     Rate and Rhythm: Normal rate and regular rhythm.     Heart sounds: Normal heart sounds.  Pulmonary:     Effort: Pulmonary effort is normal.     Breath sounds: Normal breath sounds.  Abdominal:     General: Bowel sounds are normal.     Palpations: Abdomen is soft.     Comments: Abdominal exam shows the healed laparotomy scar in the midline.  She has some firmness in the middle of the laparotomy scar.  She has the urostomy bag which is well-positioned.  Urine is clear.  There is no fluid wave in the abdomen.  Bowel sounds are  present.  She has no palpable liver or spleen tip.  Musculoskeletal:        General: No tenderness or deformity. Normal range of motion.     Cervical back: Normal range of motion.  Lymphadenopathy:     Cervical: No cervical adenopathy.  Skin:    General: Skin is warm and dry.     Findings: No erythema or rash.  Neurological:     Mental Status: She is alert and oriented to person, place, and time.  Psychiatric:        Behavior: Behavior normal.        Thought Content: Thought content normal.        Judgment: Judgment normal.    Lab Results  Component Value Date   WBC 5.4 08/03/2021   HGB 12.5 08/03/2021   HCT 38.7 08/03/2021   MCV 101.3 (H) 08/03/2021   PLT 217 08/03/2021     Chemistry      Component Value Date/Time   NA 138 08/03/2021 1025   NA 142 03/04/2017 1016   K 4.2 08/03/2021 1025   CL 104 08/03/2021 1025   CO2 29 08/03/2021 1025   BUN 17 08/03/2021 1025   BUN 13 03/04/2017 1016   CREATININE 1.01 (H) 08/03/2021 1025   CREATININE 0.78 05/20/2015 1704      Component Value Date/Time   CALCIUM 10.5 (H) 08/03/2021 1025   ALKPHOS 79 08/03/2021 1025   AST 14 (L) 08/03/2021 1025   ALT 7 08/03/2021 1025   BILITOT 0.8 08/03/2021 1025      Impression and Plan: Christina [name] is a 80 year old white female.  She had superficial bladder cancer.  She was treated with intravesicular therapy.  However, she has had had muscle invasive cancer which was poorly differentiated.  She then underwent neoadjuvant chemotherapy followed by radical cystectomy.  She is found to have stage IIIB disease.  She definitely has a high risk of recurrence.  She needs to be followed with CAT scans every 3 months.   I am not sure why she would have this rectal bleeding.  Hopefully this is just a tear.  She still has a residual thrombus.  I told her that he probably would not want [place] with [place].  I just think we probably need to have her stay around says she will  be off Xarelto.  I will have to repeat the CT angiogram of her chest when we follow-up with a message number bladder cancer.  If I have to do a CT angio of the chest and then a regular CT of  the abdomen and pelvis.  If there are any issues, she can always give [place] a call. [name]    Volanda Napoleon, MD 12/8/202211:42 AM

## 2021-08-31 ENCOUNTER — Telehealth: Payer: Self-pay | Admitting: *Deleted

## 2021-08-31 NOTE — Telephone Encounter (Signed)
This nurse called patient regarding questions about Xarelto. She stated,"the dentist didn't do any procedures on me yesterday. They are going to fix my bridges starting February 1st, and finishing up in early March. When can I start my Xarelto and when do I need to stop it before February 1st? Per Dr. Marin Olp, she can go ahead and start it back today. She needs to stop it two days prior to dental procedure, January 29th will be the last dose before procedure. I told her to keep her appointment with Dr. Marin Olp on January 31st so, she can ask him questions at that visit. She verbalized understanding.

## 2021-09-08 DIAGNOSIS — M7062 Trochanteric bursitis, left hip: Secondary | ICD-10-CM | POA: Diagnosis not present

## 2021-09-23 IMAGING — DX DG SACRUM/COCCYX 2+V
3 series · 3 of 3 positions shown · non-contrast
Comparison: None.

CLINICAL DATA: Tenderness over the coccyx

EXAM:
SACRUM AND COCCYX - 2+ VIEW

[coccyx ap]
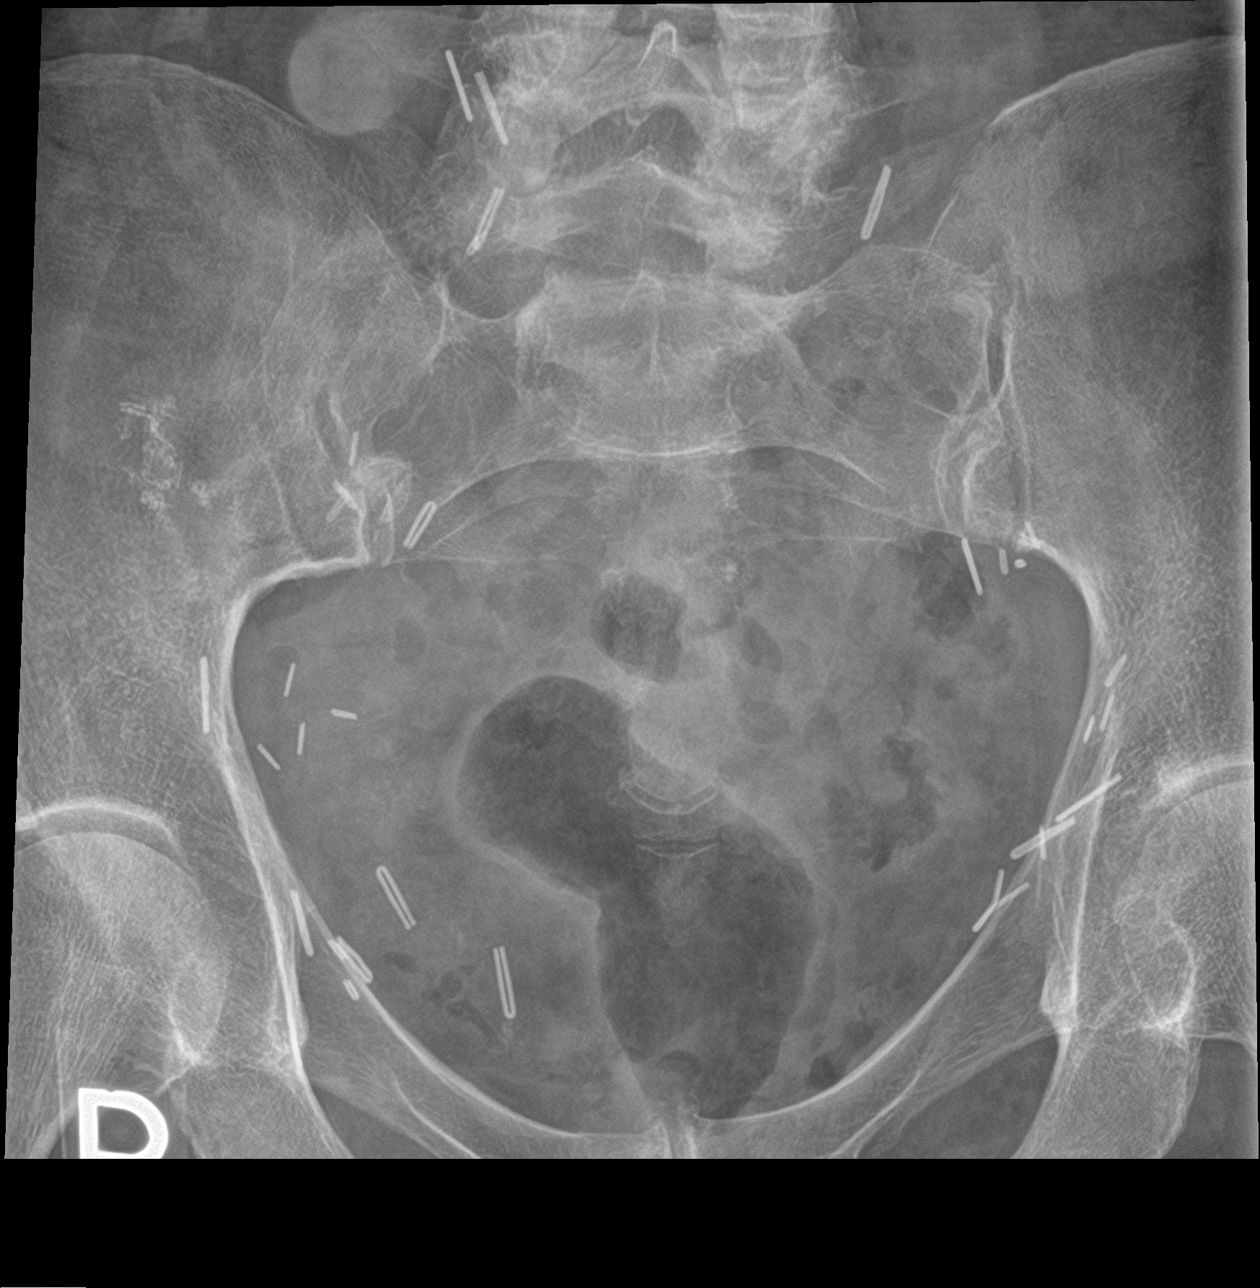

[sacrum ap]
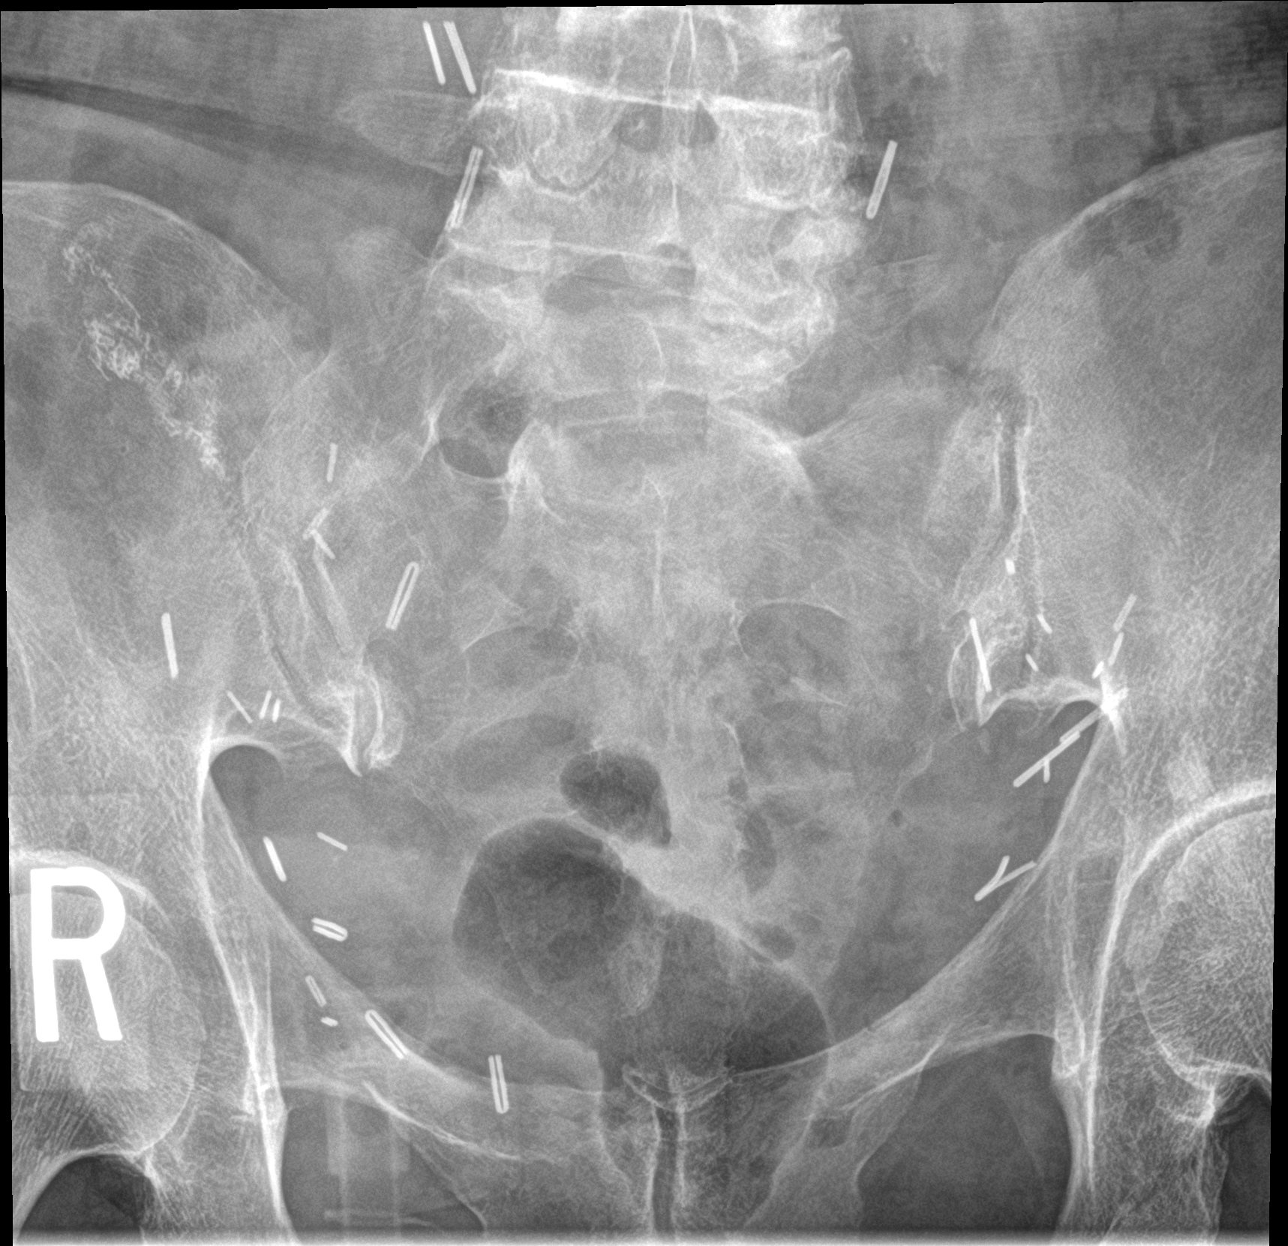

[sacrum lat]
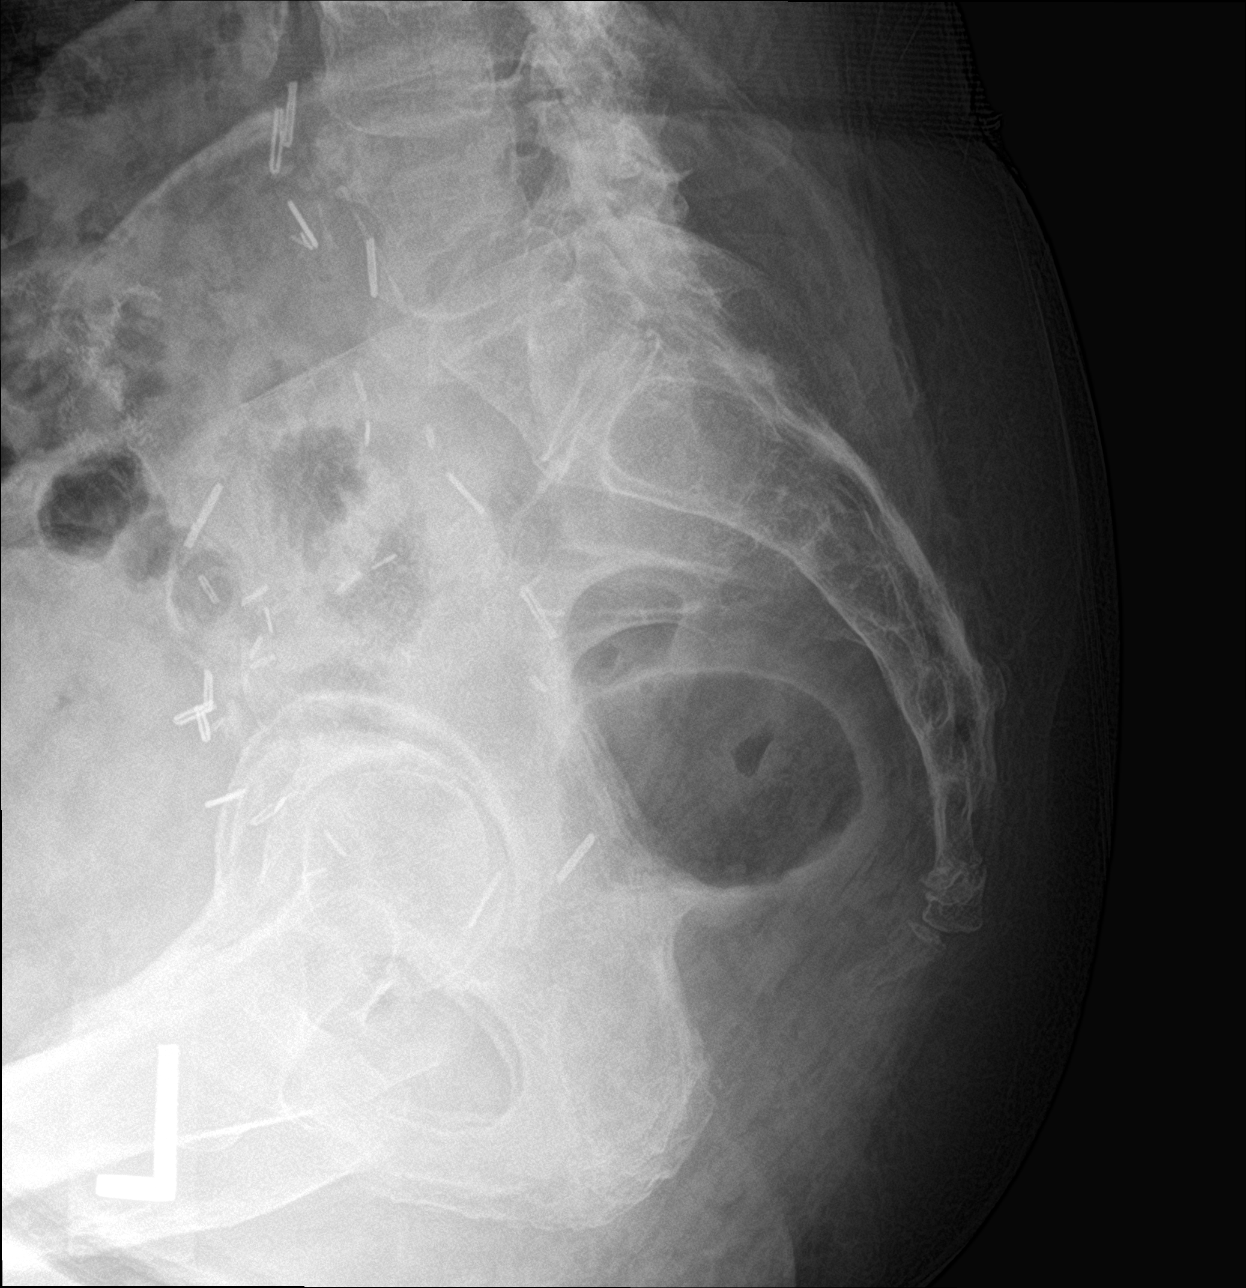

[3 of 3 positions shown; findings below may reference images not displayed]

FINDINGS: Pelvic ring is intact. Postsurgical changes are noted consistent
with the prior history of bladder carcinoma sacral ala are intact.
No bony destruction or acute fracture is seen. Degenerative changes
in the lower lumbar spine are seen.
IMPRESSION: No acute abnormality noted.

## 2021-09-26 ENCOUNTER — Ambulatory Visit (HOSPITAL_BASED_OUTPATIENT_CLINIC_OR_DEPARTMENT_OTHER)
Admission: RE | Admit: 2021-09-26 | Discharge: 2021-09-26 | Disposition: A | Discharge status: Home / Self Care | Payer: Medicare PPO | Source: Ambulatory Visit | Attending: Hematology & Oncology | Admitting: Hematology & Oncology

## 2021-09-26 ENCOUNTER — Inpatient Hospital Stay: Payer: Medicare PPO | Admitting: Hematology & Oncology

## 2021-09-26 ENCOUNTER — Inpatient Hospital Stay: Payer: Medicare PPO | Attending: Hematology & Oncology

## 2021-09-26 ENCOUNTER — Encounter (HOSPITAL_BASED_OUTPATIENT_CLINIC_OR_DEPARTMENT_OTHER): Payer: Self-pay

## 2021-09-26 ENCOUNTER — Other Ambulatory Visit: Payer: Self-pay

## 2021-09-26 ENCOUNTER — Encounter: Payer: Self-pay | Admitting: Hematology & Oncology

## 2021-09-26 VITALS — BP 155/80 | HR 70 | Temp 97.9°F | Resp 17 | Wt 166.1 lb

## 2021-09-26 DIAGNOSIS — C679 Malignant neoplasm of bladder, unspecified: Secondary | ICD-10-CM | POA: Insufficient documentation

## 2021-09-26 DIAGNOSIS — Z936 Other artificial openings of urinary tract status: Secondary | ICD-10-CM | POA: Diagnosis not present

## 2021-09-26 DIAGNOSIS — K5730 Diverticulosis of large intestine without perforation or abscess without bleeding: Secondary | ICD-10-CM | POA: Diagnosis not present

## 2021-09-26 DIAGNOSIS — J9811 Atelectasis: Secondary | ICD-10-CM | POA: Diagnosis not present

## 2021-09-26 DIAGNOSIS — Z9221 Personal history of antineoplastic chemotherapy: Secondary | ICD-10-CM | POA: Insufficient documentation

## 2021-09-26 DIAGNOSIS — Z8551 Personal history of malignant neoplasm of bladder: Secondary | ICD-10-CM

## 2021-09-26 DIAGNOSIS — E278 Other specified disorders of adrenal gland: Secondary | ICD-10-CM | POA: Diagnosis not present

## 2021-09-26 DIAGNOSIS — R911 Solitary pulmonary nodule: Secondary | ICD-10-CM | POA: Diagnosis not present

## 2021-09-26 DIAGNOSIS — I2609 Other pulmonary embolism with acute cor pulmonale: Secondary | ICD-10-CM | POA: Insufficient documentation

## 2021-09-26 DIAGNOSIS — I2699 Other pulmonary embolism without acute cor pulmonale: Secondary | ICD-10-CM | POA: Diagnosis not present

## 2021-09-26 DIAGNOSIS — Z7901 Long term (current) use of anticoagulants: Secondary | ICD-10-CM | POA: Diagnosis not present

## 2021-09-26 DIAGNOSIS — Z79899 Other long term (current) drug therapy: Secondary | ICD-10-CM | POA: Insufficient documentation

## 2021-09-26 DIAGNOSIS — I700 Atherosclerosis of aorta: Secondary | ICD-10-CM | POA: Diagnosis not present

## 2021-09-26 LAB — CMP (CANCER CENTER ONLY)
ALT: 8 U/L (ref 0–44)
AST: 16 U/L (ref 15–41)
Albumin: 4.1 g/dL (ref 3.5–5.0)
Alkaline Phosphatase: 68 U/L (ref 38–126)
Anion gap: 6 (ref 5–15)
BUN: 20 mg/dL (ref 8–23)
CO2: 29 mmol/L (ref 22–32)
Calcium: 10.7 mg/dL — ABNORMAL HIGH (ref 8.9–10.3)
Chloride: 105 mmol/L (ref 98–111)
Creatinine: 1.16 mg/dL — ABNORMAL HIGH (ref 0.44–1.00)
GFR, Estimated: 48 mL/min — ABNORMAL LOW (ref >60)
Glucose, Bld: 125 mg/dL — ABNORMAL HIGH (ref 70–99)
Potassium: 4.2 mmol/L (ref 3.5–5.1)
Sodium: 140 mmol/L (ref 135–145)
Total Bilirubin: 1 mg/dL (ref 0.3–1.2)
Total Protein: 6.4 g/dL — ABNORMAL LOW (ref 6.5–8.1)

## 2021-09-26 LAB — CBC WITH DIFFERENTIAL (CANCER CENTER ONLY)
Abs Immature Granulocytes: 0.01 10*3/uL (ref 0.00–0.07)
Basophils Absolute: 0.1 10*3/uL (ref 0.0–0.1)
Basophils Relative: 1 %
Eosinophils Absolute: 0.1 10*3/uL (ref 0.0–0.5)
Eosinophils Relative: 2 %
HCT: 42.1 % (ref 36.0–46.0)
Hemoglobin: 13.6 g/dL (ref 12.0–15.0)
Immature Granulocytes: 0 %
Lymphocytes Relative: 41 %
Lymphs Abs: 1.9 10*3/uL (ref 0.7–4.0)
MCH: 32 pg (ref 26.0–34.0)
MCHC: 32.3 g/dL (ref 30.0–36.0)
MCV: 99.1 fL (ref 80.0–100.0)
Monocytes Absolute: 0.3 10*3/uL (ref 0.1–1.0)
Monocytes Relative: 7 %
Neutro Abs: 2.3 10*3/uL (ref 1.7–7.7)
Neutrophils Relative %: 49 %
Platelet Count: 197 10*3/uL (ref 150–400)
RBC: 4.25 MIL/uL (ref 3.87–5.11)
RDW: 13.5 % (ref 11.5–15.5)
WBC Count: 4.6 10*3/uL (ref 4.0–10.5)
nRBC: 0 % (ref 0.0–0.2)

## 2021-09-26 MED ORDER — IOHEXOL 350 MG/ML SOLN
100.0000 mL | Freq: Once | INTRAVENOUS | Status: AC | PRN
  Administered 2021-09-26: 100 mL via INTRAVENOUS

## 2021-09-26 NOTE — Progress Notes (Signed)
Hematology and Oncology Follow Up Visit  Christina [name] [id number] 20-May-1941 81 y.o. 09/26/2021   Principle Diagnosis:  Stage IIIB (T1N3M0) invasive urothelial carcinoma of the bladder  Subsegmental blood clot in right lower lung  Current Therapy:   Status post neoadjuvant chemotherapy with ddMVAC Radical cystectomy on 03/03/2020  Xarelto 20 mg p.o. daily-6 months of therapy to start on 06/30/2021     Interim History:  Christina [name] is back for follow-up.  She looks great.  She did have her CT angiogram of the chest today.  Thankfully, there is no blood clot seen in the lungs now.  She had a CT of the abdomen and pelvis.  Again there is no evidence of recurrent bladder cancer.  She is feeling well.  She has had no problems with her urostomy bag.  She has had good appetite.  She is gained a little bit of weight.  She is not too happy about that.  She had a very nice Christmas with her family.  She has had no issues with cough or shortness of breath.  She has had no problems with COVID.  She is on Xarelto.  We will continue her on full dose Xarelto for a total of 6 months and then think about switch her to a maintenance dose.  She has had no problems with rashes.  There is been no leg swelling.  Overall, I would say her performance status is ECOG 1.  .     Medications:  Current Outpatient Medications:    LORazepam (ATIVAN) 0.5 MG tablet, Take 1 tablet (0.5 mg total) by mouth 2 (two) times daily as needed for anxiety., Disp: 60 tablet, Rfl: 5   magnesium oxide (MAG-OX) 400 MG tablet, Take by mouth daily., Disp: , Rfl:    PARoxetine (PAXIL) 20 MG tablet, Take 1 tablet (20 mg total) by mouth daily., Disp: 90 tablet, Rfl: 1   Potassium Chloride (KLOR-CON PO), Take 40 mEq by mouth daily., Disp: , Rfl:    rivaroxaban (XARELTO) 20 MG TABS tablet, Take 1 tablet (20 mg total) by mouth daily with supper., Disp: 30 tablet, Rfl: 5   VITAMIN D PO, Take by mouth., Disp: , Rfl:     RIVAROXABAN (XARELTO) VTE STARTER PACK (15 & 20 MG), Follow package directions: Take one 15mg  tablet by mouth twice a day. On day 22, switch to one 20mg  tablet once a day. Take with food. (Patient not taking: Reported on 09/26/2021), Disp: 51 each, Rfl: 0  Allergies:  Allergies  Allergen Reactions   Nutrasweet Aspartame [Aspartame] Diarrhea   Lisinopril Cough    Past Medical History, Surgical history, Social history, and Family History were reviewed and updated.  Review of Systems: Review of Systems  Constitutional: Negative.   HENT:  Negative.    Eyes: Negative.   Respiratory: Negative.    Cardiovascular: Negative.   Gastrointestinal: Negative.   Endocrine: Negative.   Genitourinary: Negative.    Musculoskeletal: Negative.   Skin: Negative.   Neurological: Negative.   Hematological: Negative.   Psychiatric/Behavioral: Negative.     Physical Exam:  weight is 166 lb 1.3 oz (75.3 kg). Her oral temperature is 97.9 F (36.6 C). Her blood pressure is 155/80 (abnormal) and her pulse is 70. Her respiration is 17 and oxygen saturation is 98%.   Wt Readings from Last 3 Encounters:  09/26/21 166 lb 1.3 oz (75.3 kg)  08/03/21 162 lb (73.5 kg)  06/30/21 161 lb (73 kg)    Physical Exam Vitals  reviewed.  HENT:     Head: Normocephalic and atraumatic.  Eyes:     Pupils: Pupils are equal, round, and reactive to light.  Cardiovascular:     Rate and Rhythm: Normal rate and regular rhythm.     Heart sounds: Normal heart sounds.  Pulmonary:     Effort: Pulmonary effort is normal.     Breath sounds: Normal breath sounds.  Abdominal:     General: Bowel sounds are normal.     Palpations: Abdomen is soft.     Comments: Abdominal exam shows the healed laparotomy scar in the midline.  She has some firmness in the middle of the laparotomy scar.  She has the urostomy bag which is well-positioned.  Urine is clear.  There is no fluid wave in the abdomen.  Bowel sounds are present.  She has no  palpable liver or spleen tip.  Musculoskeletal:        General: No tenderness or deformity. Normal range of motion.     Cervical back: Normal range of motion.  Lymphadenopathy:     Cervical: No cervical adenopathy.  Skin:    General: Skin is warm and dry.     Findings: No erythema or rash.  Neurological:     Mental Status: She is alert and oriented to person, place, and time.  Psychiatric:        Behavior: Behavior normal.        Thought Content: Thought content normal.        Judgment: Judgment normal.    Lab Results  Component Value Date   WBC 4.6 09/26/2021   HGB 13.6 09/26/2021   HCT 42.1 09/26/2021   MCV 99.1 09/26/2021   PLT 197 09/26/2021     Chemistry      Component Value Date/Time   NA 140 09/26/2021 1030   NA 142 03/04/2017 1016   K 4.2 09/26/2021 1030   CL 105 09/26/2021 1030   CO2 29 09/26/2021 1030   BUN 20 09/26/2021 1030   BUN 13 03/04/2017 1016   CREATININE 1.16 (H) 09/26/2021 1030   CREATININE 0.78 05/20/2015 1704      Component Value Date/Time   CALCIUM 10.7 (H) 09/26/2021 1030   ALKPHOS 68 09/26/2021 1030   AST 16 09/26/2021 1030   ALT 8 09/26/2021 1030   BILITOT 1.0 09/26/2021 1030      Impression and Plan: Christina [name] is a 81 year old white female.  She had superficial bladder cancer.  She was treated with intravesicular therapy.  However, she has had had muscle invasive cancer which was poorly differentiated.  She then underwent neoadjuvant chemotherapy followed by radical cystectomy.  She is found to have stage IIIB disease.  She definitely has a high risk of recurrence.  She needs to be followed with CAT scans every 3 months.   I am glad that she has no pulmonary embolus now.  Again we will keep her on full dose Lovenox until we get her back in April.  She probably does not need another scan probably for good 4 months or so.  I am just happy that she is doing so well.  I know that the urostomy can be a little bit of a challenge for  her.  I would like to see her back in a couple months. Volanda Napoleon, MD 1/31/202312:11 PM

## 2021-09-27 IMAGING — US US BREAST*R* LIMITED INC AXILLA
1 series · 8 of 8 positions shown · non-contrast
Comparison: CT of the chest abdomen and pelvis on 04/13/2020
performed at [facility], mammogram 11/05/2019 and earlier

CLINICAL DATA: Patient presents for further evaluation of the RIGHT
breast. Patient had CT of the chest abdomen and pelvis 04/13/2020 at
[facility], showing a mass in the MEDIAL portion of the RIGHT breast.
History of bladder cancer.

EXAM:
DIGITAL DIAGNOSTIC RIGHT MAMMOGRAM WITH CAD AND TOMO
ULTRASOUND RIGHT BREAST

[Series 1: us breast*right* limited inc axilla · 0.06mm/px · 8 of 8 slices shown]
[im 1/8]
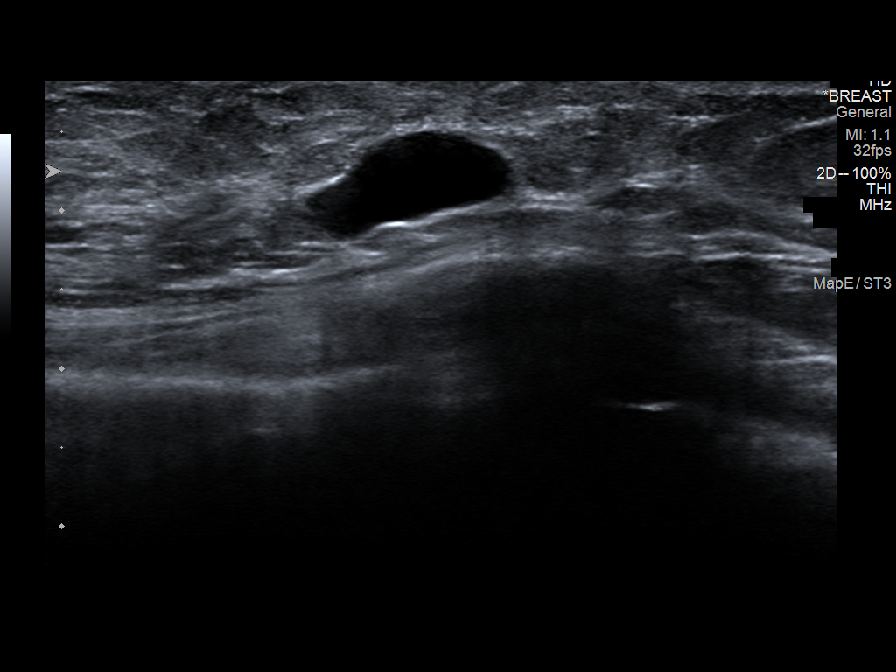
[im 2/8]
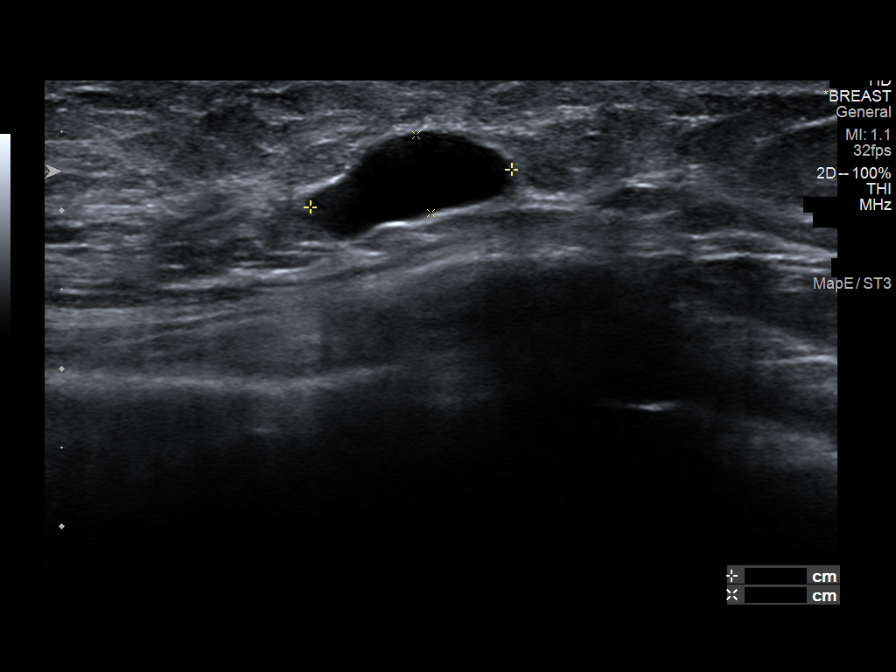
[im 3/8]
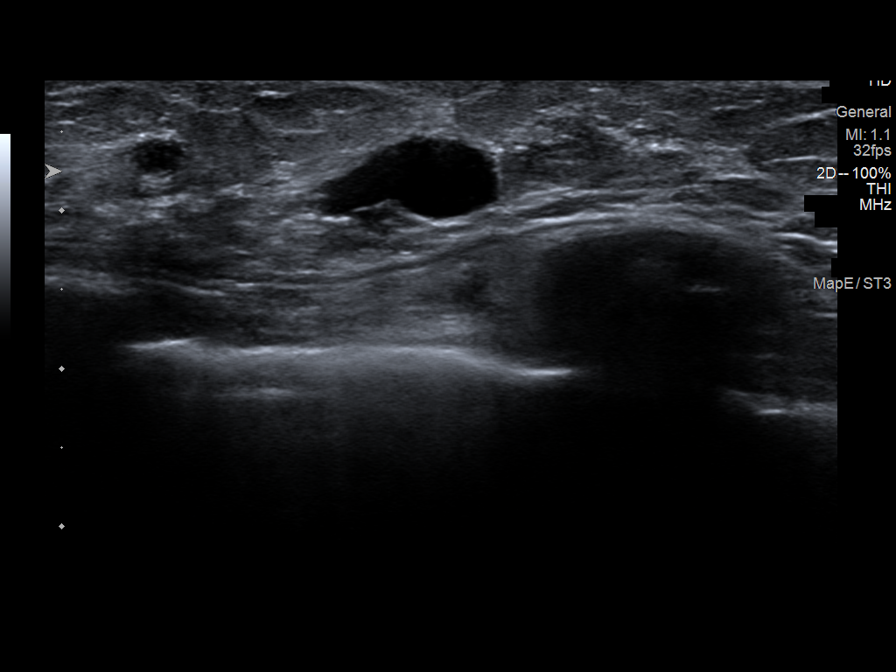
[im 4/8]
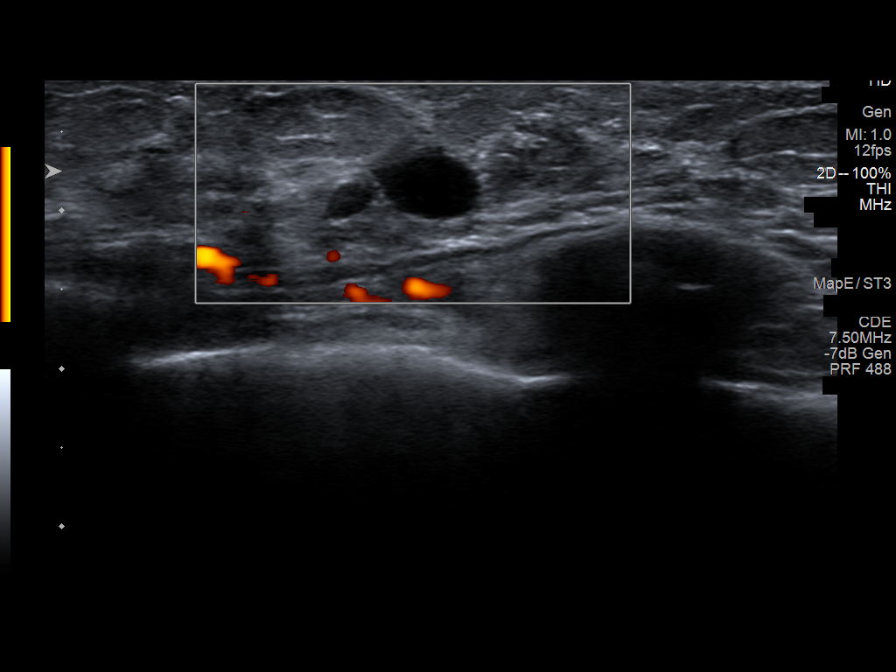
[im 5/8]
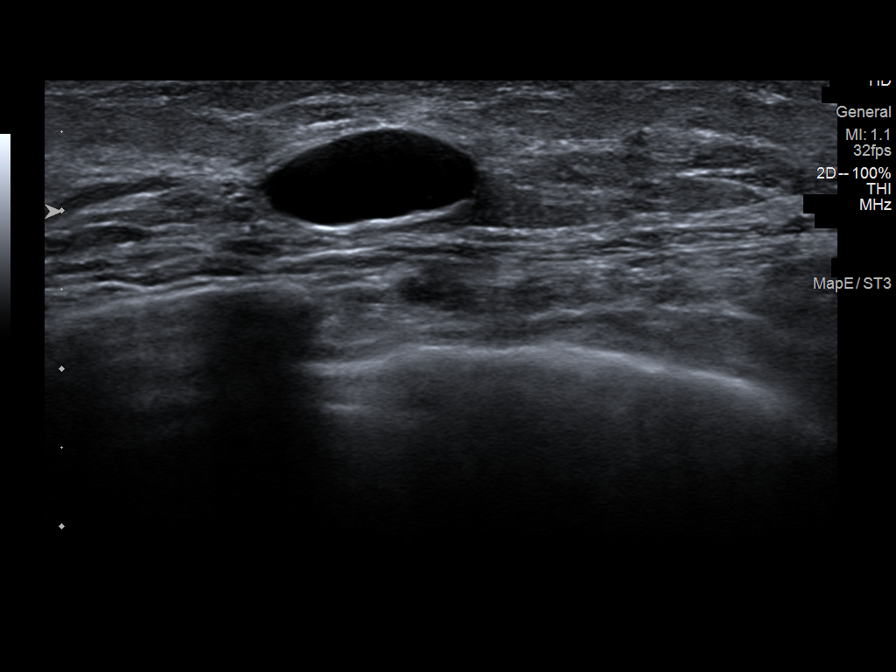
[im 6/8]
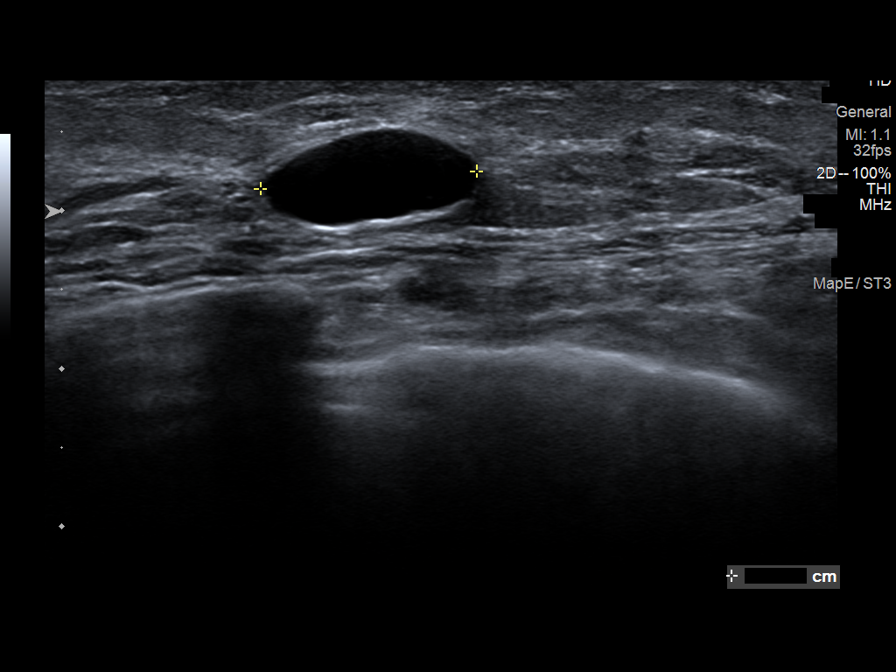
[im 7/8]
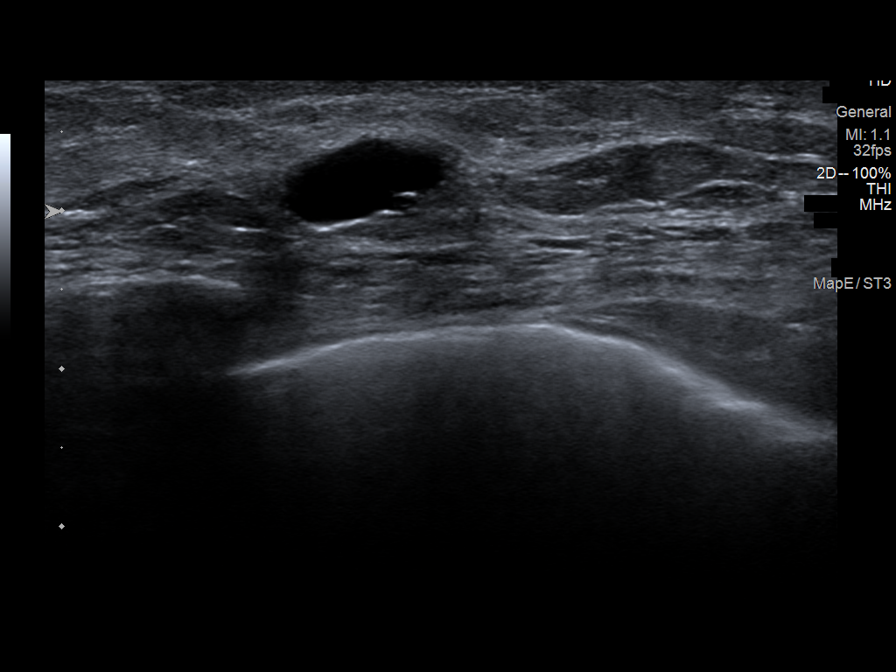
[im 8/8]
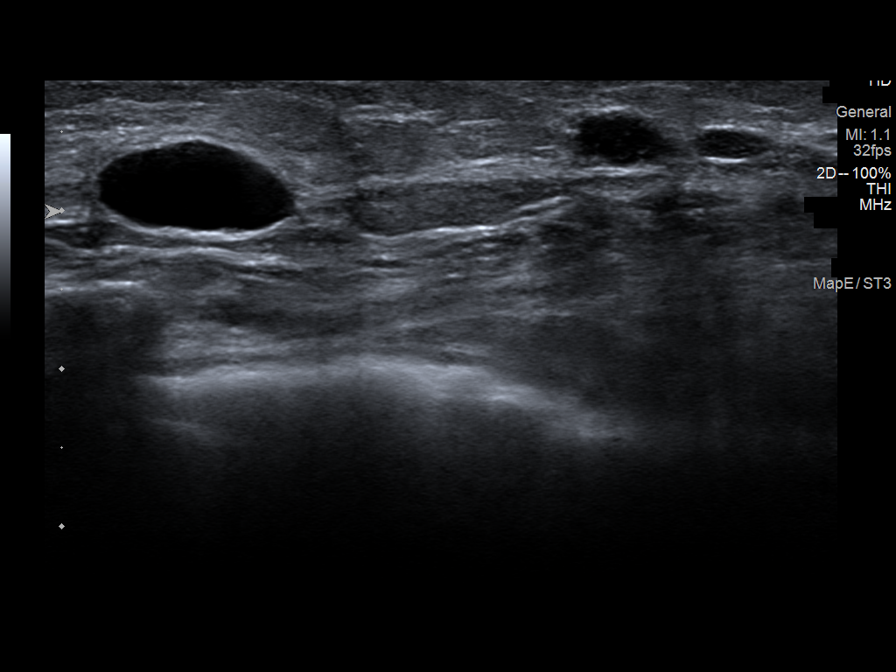

[8 of 8 positions shown; findings below may reference images not displayed]

ACR Breast Density Category c: The breast tissue is heterogeneously
dense, which may obscure small masses.
FINDINGS: A circumscribed oval mass is identified in the MEDIAL aspect of the
RIGHT breast. Mass measures 1.4 centimeters. Scattered benign
calcifications are identified throughout the RIGHT breast.

Mammographic images were processed with CAD.

Targeted ultrasound is performed, showing a simple cyst in the RIGHT
breast, 3 o'clock location 6 centimeters from the nipple. Cyst is
1.3 x 0.5 x 1.4 centimeters. Scattered sub centimeters cysts are
also identified in the MEDIAL aspect of the RIGHT breast.
IMPRESSION: Benign cyst in the MEDIAL portion of the RIGHT breast, accounting
for the recent CT abnormality. No mammographic or ultrasound
evidence for malignancy.

RECOMMENDATION:
Recommend bilateral screening mammogram in October 2020.

I have discussed the findings and recommendations with the patient.
If applicable, a reminder letter will be sent to the patient
regarding the next appointment.

BI-RADS CATEGORY  2: Benign.

## 2021-09-27 IMAGING — MG MM DIGITAL DIAGNOSTIC UNILAT*R* W/ TOMO W/ CAD
6 series · 6 of 18 positions shown · non-contrast
Comparison: CT of the chest abdomen and pelvis on 04/13/2020
performed at [facility], mammogram 11/05/2019 and earlier

CLINICAL DATA: Patient presents for further evaluation of the RIGHT
breast. Patient had CT of the chest abdomen and pelvis 04/13/2020 at
[facility], showing a mass in the MEDIAL portion of the RIGHT breast.
History of bladder cancer.

EXAM:
DIGITAL DIAGNOSTIC RIGHT MAMMOGRAM WITH CAD AND TOMO
ULTRASOUND RIGHT BREAST

[R MLO synth-2D (1 of 2)]
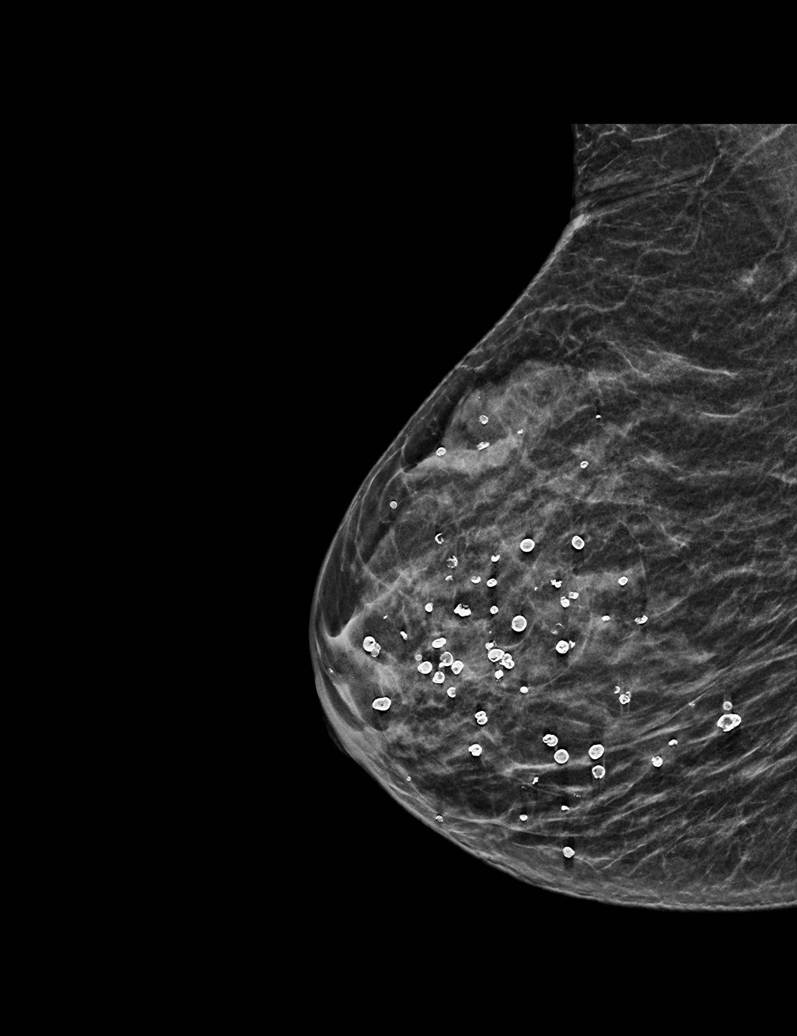

[R MLO synth-2D (2 of 2)]
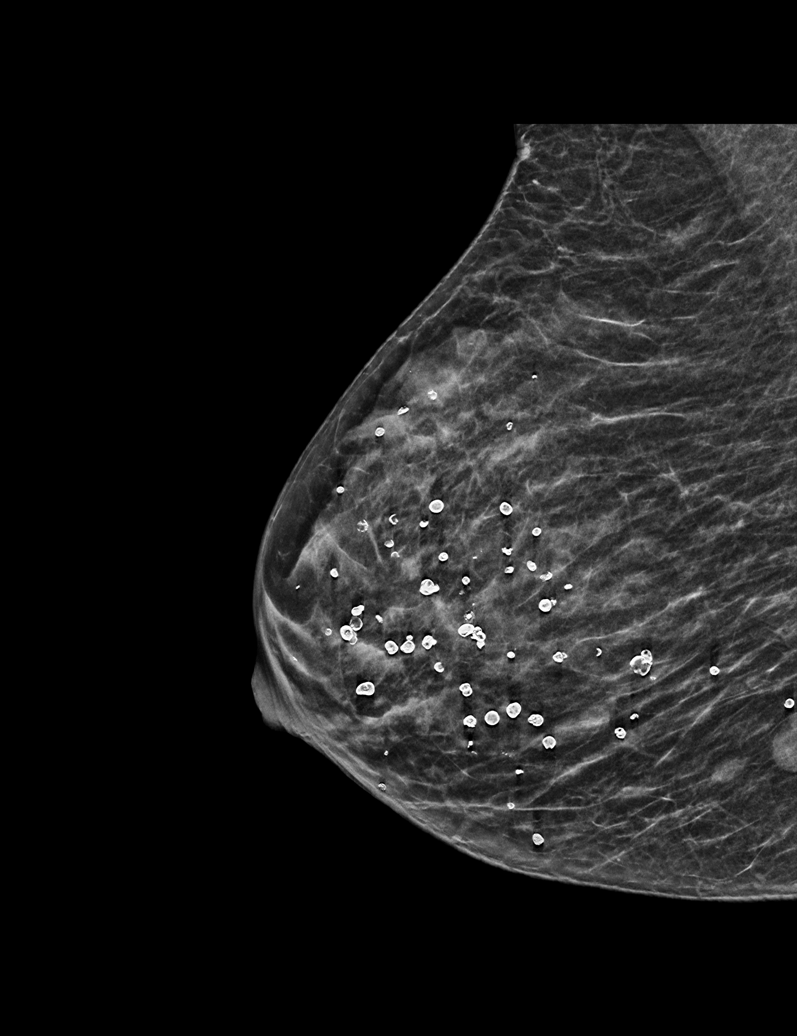

[R CC synth-2D]
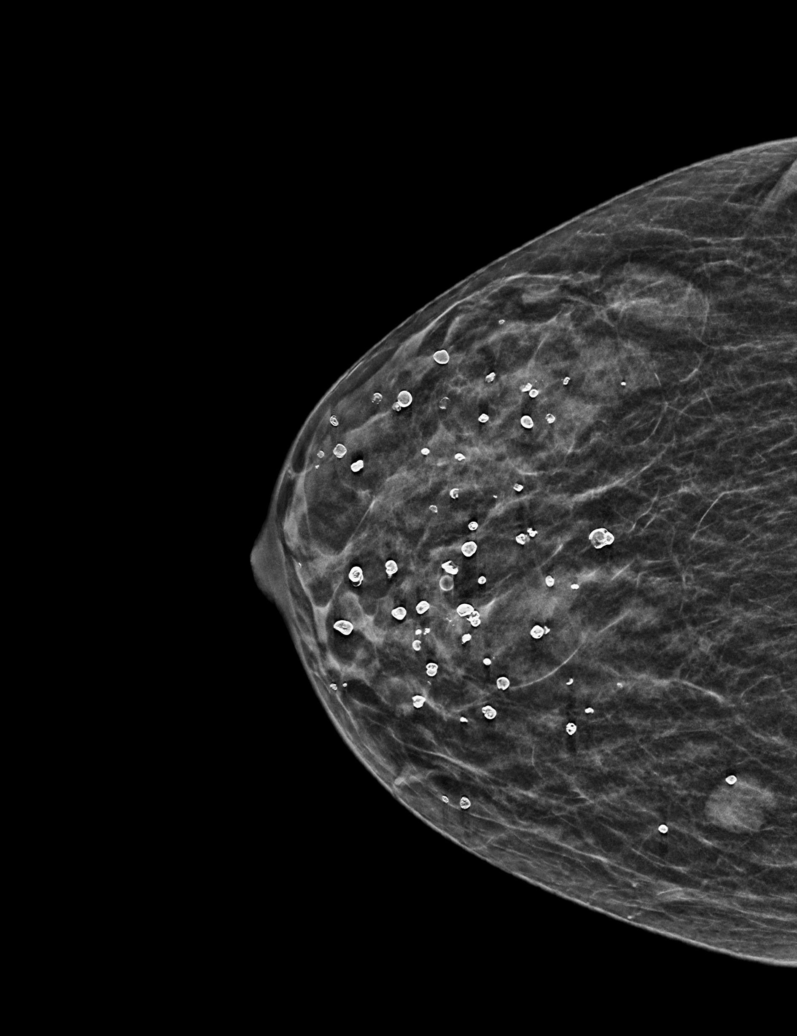

[R CC tomo · tomo slice 17/34.0]
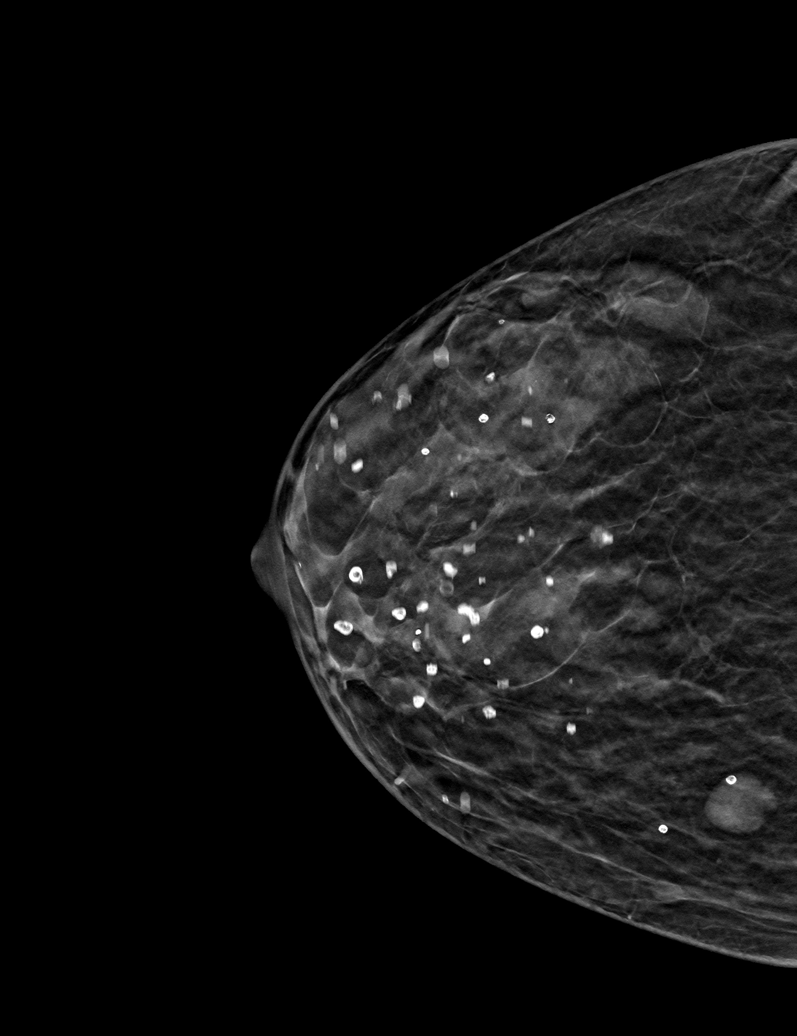

[R MLO tomo (1 of 2) · tomo slice 18/35.0]
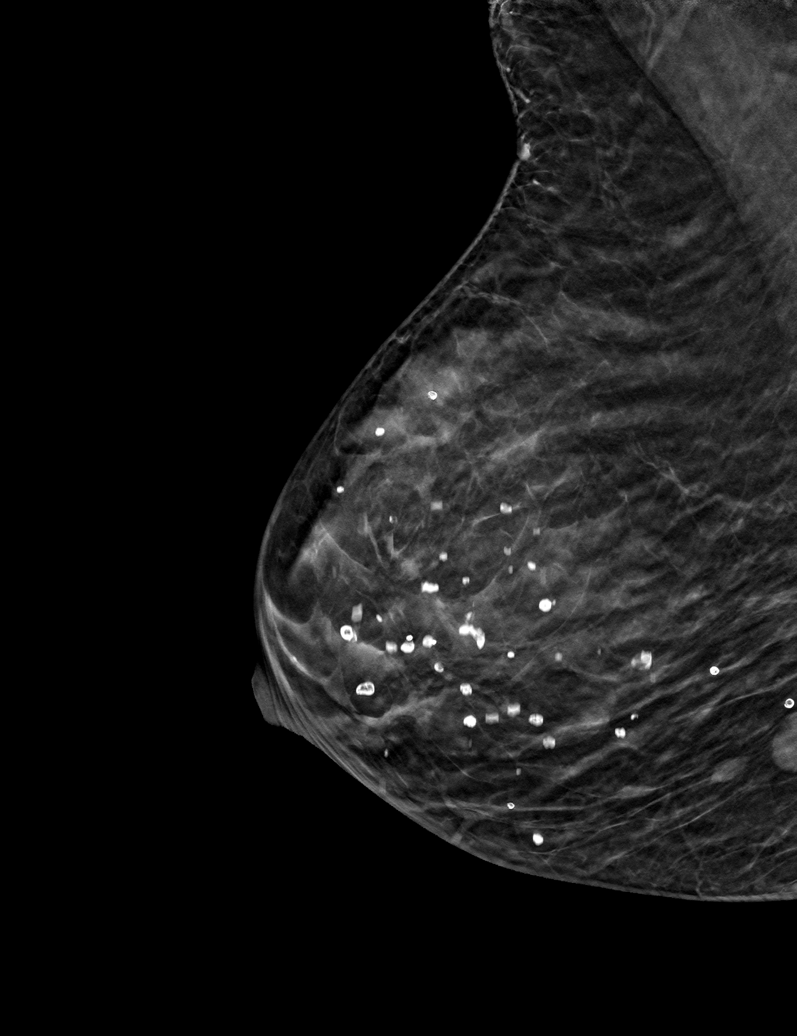

[R MLO tomo (2 of 2) · tomo slice 18/35.0]
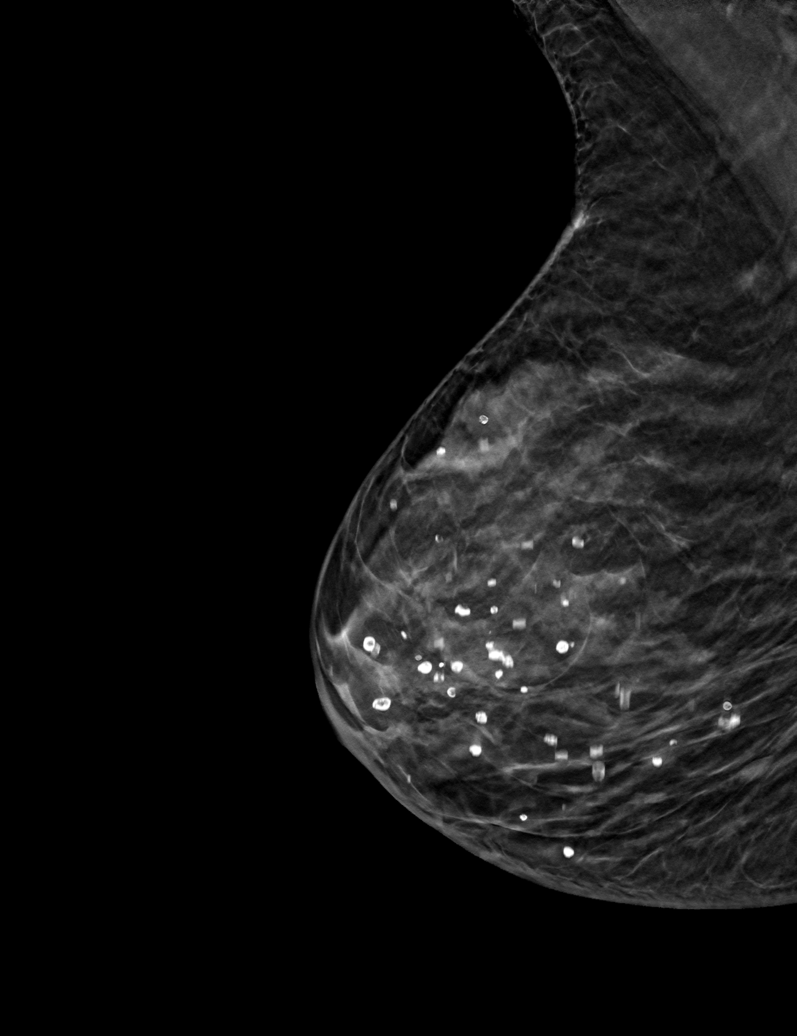

[6 of 18 positions shown; findings below may reference images not displayed]

ACR Breast Density Category c: The breast tissue is heterogeneously
dense, which may obscure small masses.
FINDINGS: A circumscribed oval mass is identified in the MEDIAL aspect of the
RIGHT breast. Mass measures 1.4 centimeters. Scattered benign
calcifications are identified throughout the RIGHT breast.

Mammographic images were processed with CAD.

Targeted ultrasound is performed, showing a simple cyst in the RIGHT
breast, 3 o'clock location 6 centimeters from the nipple. Cyst is
1.3 x 0.5 x 1.4 centimeters. Scattered sub centimeters cysts are
also identified in the MEDIAL aspect of the RIGHT breast.
IMPRESSION: Benign cyst in the MEDIAL portion of the RIGHT breast, accounting
for the recent CT abnormality. No mammographic or ultrasound
evidence for malignancy.

RECOMMENDATION:
Recommend bilateral screening mammogram in October 2020.

I have discussed the findings and recommendations with the patient.
If applicable, a reminder letter will be sent to the patient
regarding the next appointment.

BI-RADS CATEGORY  2: Benign.

## 2021-10-03 IMAGING — CT CT CHEST-ABD-PELV W/ CM
2 of 6 series · 12 of 36 positions shown, 14 images · IV contrast (Omnipaque)
Comparison: 11/16/2019 PET-CT. 05/06/2017 CT abdomen/pelvis.
04/13/2020 outside CT chest, abdomen and pelvis.

CLINICAL DATA: Invasive bladder cancer treated surgically.
Chemotherapy discontinued due to side effects. Restaging.

EXAM:
CT CHEST, ABDOMEN, AND PELVIS WITH CONTRAST
TECHNIQUE: Multidetector CT imaging of the chest, abdomen and pelvis was
performed following the standard protocol during bolus
administration of intravenous contrast.
CONTRAST:  100mL OMNIPAQUE IOHEXOL 300 MG/ML  SOLN

[Series 2: cap with 2 · axial · 0.89mm/px · z∈[-615,-85]mm · 9 of 134 slices shown, 11 images]
[im 14/134  mediastinal]
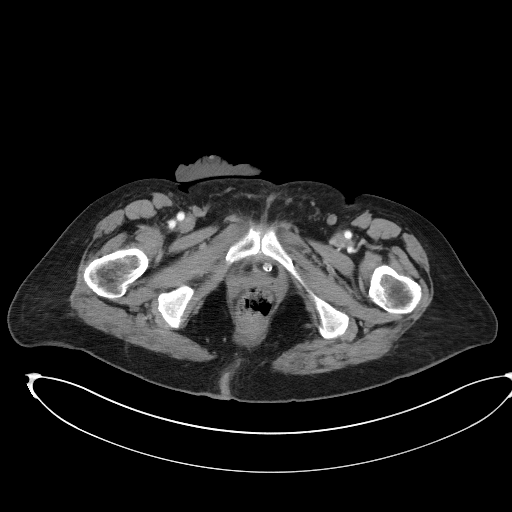
[im 14/134  bone]
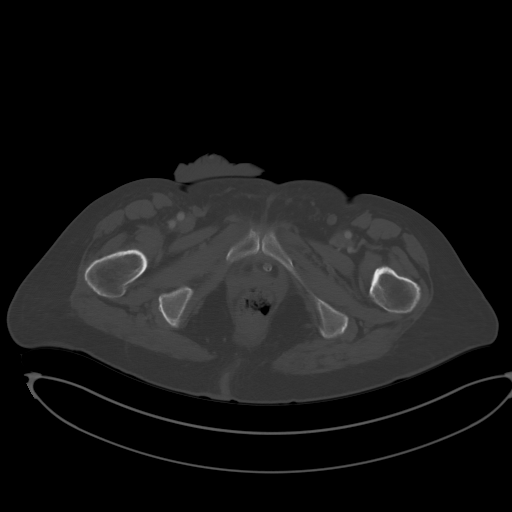
[im 27/134  mediastinal]
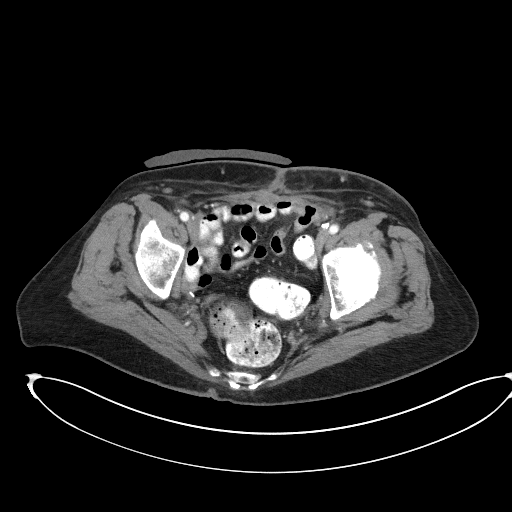
[im 40/134  mediastinal]
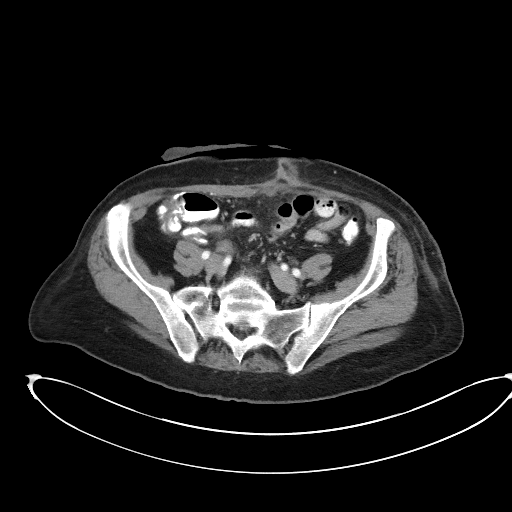
[im 54/134  mediastinal]
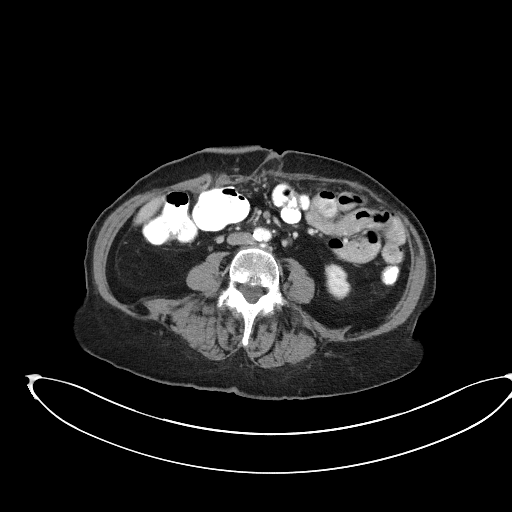
[im 67/134  mediastinal]
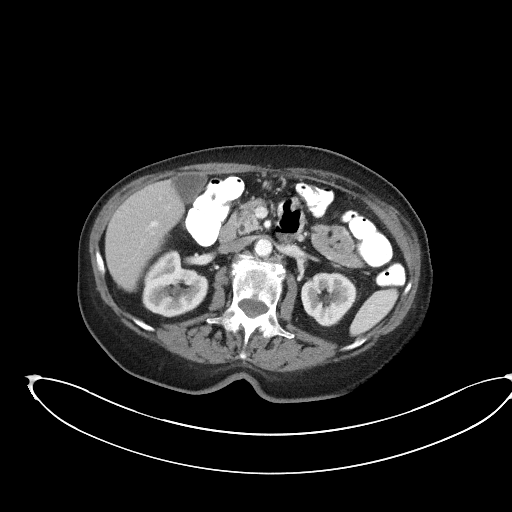
[im 80/134  mediastinal]
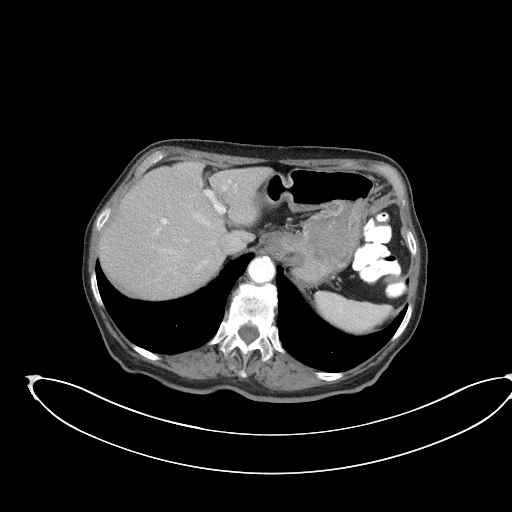
[im 94/134  mediastinal]
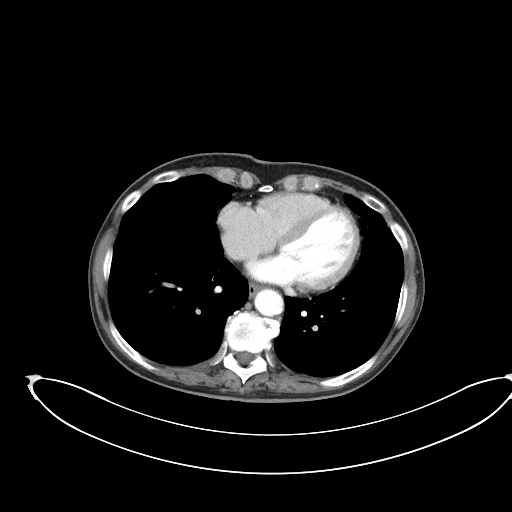
[im 107/134  mediastinal]
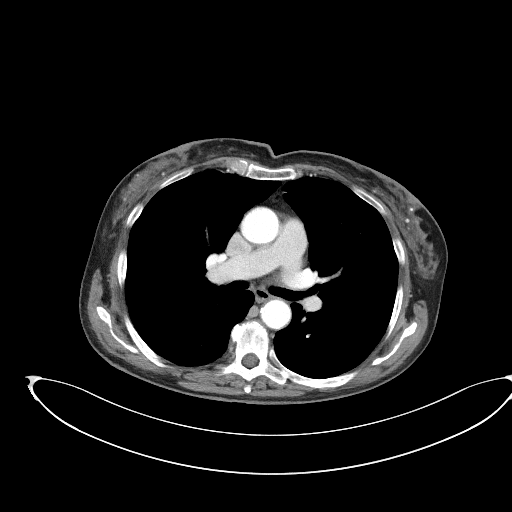
[im 120/134  mediastinal]
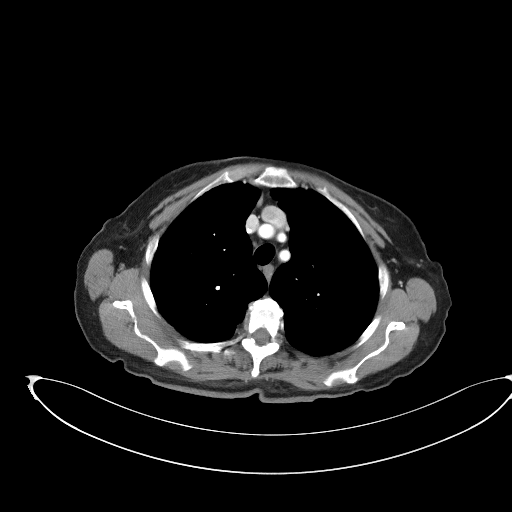
[im 120/134  bone]
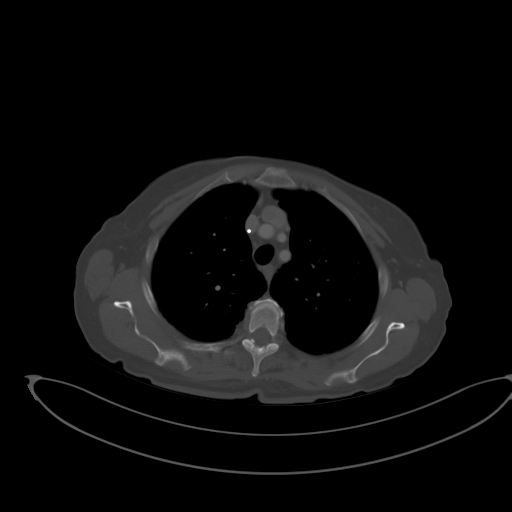

[Series 5: coronals · coronal · 0.80mm/px · 3 of 119 slices shown]
[im 24/119  mediastinal]
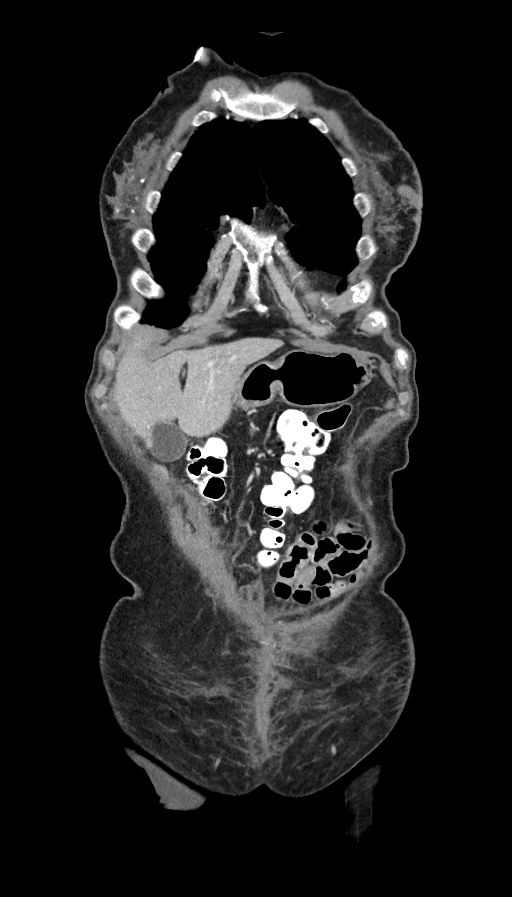
[im 48/119  mediastinal]
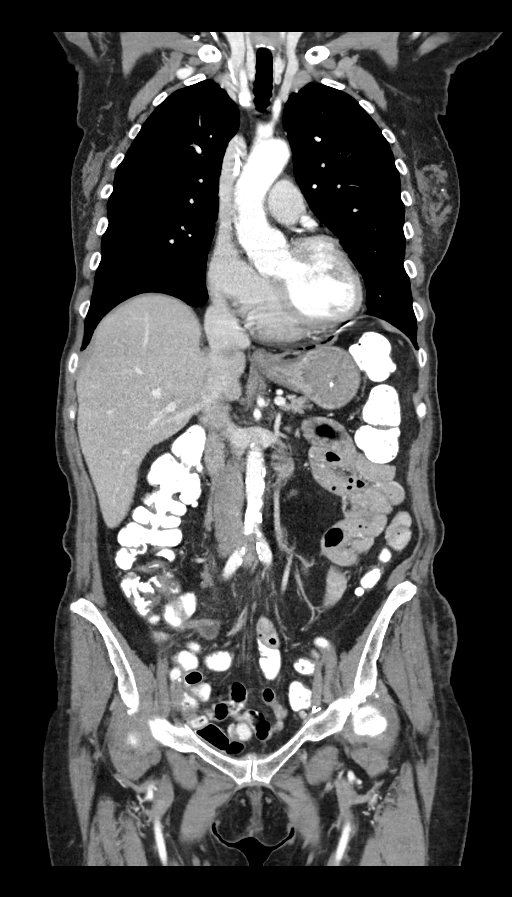
[im 71/119  mediastinal]
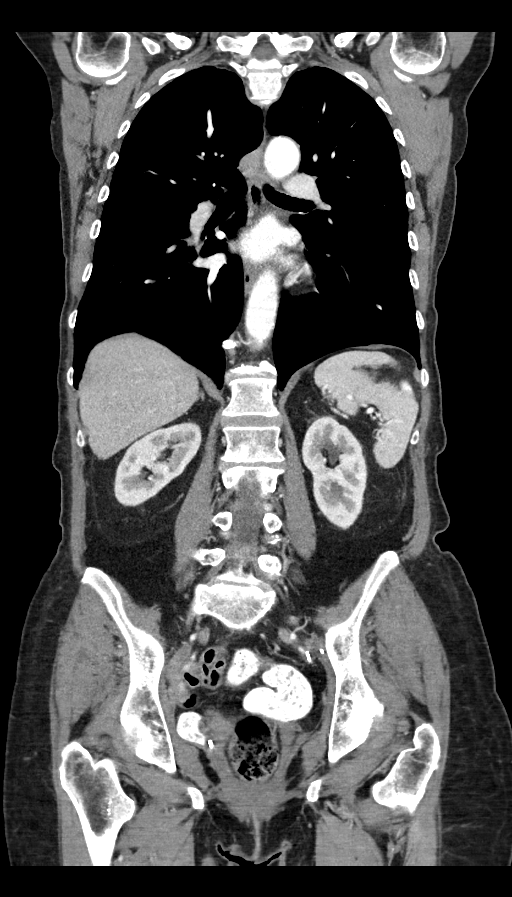

[12 of 36 positions shown; findings below may reference images not displayed]

FINDINGS: CT CHEST FINDINGS

Cardiovascular: Normal heart size. No significant pericardial
effusion/thickening. Right internal jugular Port-A-Cath terminates
in the upper third of the SVC. Atherosclerotic nonaneurysmal
thoracic aorta. Normal caliber pulmonary arteries. No central
pulmonary emboli.

Mediastinum/Nodes: No discrete thyroid nodules. Unremarkable
esophagus. No pathologically enlarged axillary, mediastinal or hilar
lymph nodes.

Lungs/Pleura: No pneumothorax. No pleural effusion. Mild
centrilobular emphysema. No acute consolidative airspace disease or
lung masses. A few scattered tiny solid pulmonary nodules, largest 3
mm in the left lower lobe (series 4/image 116), all stable. No new
significant pulmonary nodules.

Musculoskeletal: No aggressive appearing focal osseous lesions. Mild
thoracic spondylosis.

CT ABDOMEN PELVIS FINDINGS

Hepatobiliary: Normal liver with no liver mass. Normal gallbladder
with no radiopaque cholelithiasis. No biliary ductal dilatation.

Pancreas: Normal, with no mass or duct dilation.

Spleen: Normal size. No mass.

Adrenals/Urinary Tract: Stable 1.5 cm right adrenal nodule
compatible with a benign adenoma as seen on prior PET-CT. No left
adrenal nodule. Normal kidneys with no hydronephrosis and no renal
mass. Postsurgical changes from cystectomy with ileal conduit
urinary diversion in the ventral right abdominal wall. Normal
caliber ureters. Ileal conduit is collapsed and grossly normal.

Stomach/Bowel: Normal non-distended stomach. Enteroenterostomy in
the right lower quadrant. No unexpected small bowel dilatation. No
small bowel wall thickening. Oral contrast transits to the colon.
Appendix not discretely visualized. Mild left colonic
diverticulosis. No large bowel wall thickening or significant
pericolonic fat stranding.

Vascular/Lymphatic: Atherosclerotic nonaneurysmal abdominal aorta.
Patent portal, splenic, hepatic and renal veins. No pathologically
enlarged lymph nodes in the abdomen or pelvis.

Reproductive: Status post hysterectomy, with no abnormal findings at
the vaginal cuff. No adnexal mass.

Other: No pneumoperitoneum, ascites or focal fluid collection.
Scarring in midline ventral lower abdominal wall from laparotomy. No
discrete peritoneal nodularity.

Musculoskeletal: No aggressive appearing focal osseous lesions. Mild
lumbar spondylosis.
IMPRESSION: 1. No findings suspicious for metastatic disease in the chest,
abdomen or pelvis. Tiny bilateral pulmonary nodules are all stable
and warrant continued CT follow-up.
2. Expected postsurgical changes from cystectomy with ileal conduit
urinary diversion. No hydronephrosis.
3. Stable right adrenal adenoma.
4. Mild left colonic diverticulosis.
5. Aortic Atherosclerosis ([id number].Y) and Emphysema ([id number]).

## 2021-10-24 ENCOUNTER — Ambulatory Visit (INDEPENDENT_AMBULATORY_CARE_PROVIDER_SITE_OTHER): Payer: Medicare PPO | Admitting: Family Medicine

## 2021-10-24 ENCOUNTER — Other Ambulatory Visit: Payer: Self-pay

## 2021-10-24 ENCOUNTER — Encounter: Payer: Self-pay | Admitting: Family Medicine

## 2021-10-24 VITALS — BP 94/60 | HR 74 | Temp 97.7°F | Ht 65.0 in | Wt 169.0 lb

## 2021-10-24 DIAGNOSIS — K625 Hemorrhage of anus and rectum: Secondary | ICD-10-CM | POA: Insufficient documentation

## 2021-10-24 DIAGNOSIS — Z0000 Encounter for general adult medical examination without abnormal findings: Secondary | ICD-10-CM

## 2021-10-24 DIAGNOSIS — Z1231 Encounter for screening mammogram for malignant neoplasm of breast: Secondary | ICD-10-CM | POA: Diagnosis not present

## 2021-10-24 DIAGNOSIS — E559 Vitamin D deficiency, unspecified: Secondary | ICD-10-CM | POA: Diagnosis not present

## 2021-10-24 DIAGNOSIS — F418 Other specified anxiety disorders: Secondary | ICD-10-CM | POA: Diagnosis not present

## 2021-10-24 DIAGNOSIS — M8580 Other specified disorders of bone density and structure, unspecified site: Secondary | ICD-10-CM

## 2021-10-24 DIAGNOSIS — Z8551 Personal history of malignant neoplasm of bladder: Secondary | ICD-10-CM | POA: Diagnosis not present

## 2021-10-24 DIAGNOSIS — Z6828 Body mass index (BMI) 28.0-28.9, adult: Secondary | ICD-10-CM | POA: Diagnosis not present

## 2021-10-24 DIAGNOSIS — R7309 Other abnormal glucose: Secondary | ICD-10-CM | POA: Diagnosis not present

## 2021-10-24 DIAGNOSIS — K5904 Chronic idiopathic constipation: Secondary | ICD-10-CM

## 2021-10-24 DIAGNOSIS — K602 Anal fissure, unspecified: Secondary | ICD-10-CM | POA: Insufficient documentation

## 2021-10-24 DIAGNOSIS — I10 Essential (primary) hypertension: Secondary | ICD-10-CM | POA: Diagnosis not present

## 2021-10-24 HISTORY — DX: Anal fissure, unspecified: K60.2

## 2021-10-24 HISTORY — DX: Hemorrhage of anus and rectum: K62.5

## 2021-10-24 HISTORY — DX: Chronic idiopathic constipation: K59.04

## 2021-10-24 LAB — LIPID PANEL
Cholesterol: 190 mg/dL (ref 0–200)
HDL: 61.6 mg/dL (ref >39.00)
LDL Cholesterol: 109 mg/dL — ABNORMAL HIGH (ref 0–99)
NonHDL: 128.88
Total CHOL/HDL Ratio: 3
Triglycerides: 97 mg/dL (ref 0.0–149.0)
VLDL: 19.4 mg/dL (ref 0.0–40.0)

## 2021-10-24 LAB — TSH: TSH: 1.73 u[IU]/mL (ref 0.35–5.50)

## 2021-10-24 LAB — VITAMIN D 25 HYDROXY (VIT D DEFICIENCY, FRACTURES): VITD: 41.46 ng/mL (ref 30.00–100.00)

## 2021-10-24 LAB — HEMOGLOBIN A1C: Hgb A1c MFr Bld: 6 % (ref 4.6–6.5)

## 2021-10-24 MED ORDER — TETANUS-DIPHTH-ACELL PERTUSSIS 5-2.5-18.5 LF-MCG/0.5 IM SUSP: 0.5000 mL | Freq: Once | INTRAMUSCULAR | 0 refills | Status: AC

## 2021-10-24 MED ORDER — PAROXETINE HCL 20 MG PO TABS
20.0000 mg | ORAL_TABLET | Freq: Every day | ORAL | 1 refills | Status: DC
Start: 2021-10-24 — End: 2022-04-09

## 2021-10-24 MED ORDER — LORAZEPAM 0.5 MG PO TABS
0.5000 mg | ORAL_TABLET | Freq: Two times a day (BID) | ORAL | 5 refills | Status: DC | PRN
Start: 2021-10-24 — End: 2022-04-10

## 2021-10-24 NOTE — Patient Instructions (Signed)
°Great to see you today.  °I have refilled the medication(s) we provide.  ° °If labs were collected, we will inform you of lab results once received either by echart message or telephone call.  ° - echart message- for normal results that have been seen by the patient already.  ° - telephone call: abnormal results or if patient has not viewed results in their echart. ° °Health Maintenance, Female °Adopting a healthy lifestyle and getting preventive care are important in promoting health and wellness. Ask your health care provider about: °The right schedule for you to have regular tests and exams. °Things you can do on your own to prevent diseases and keep yourself healthy. °What should I know about diet, weight, and exercise? °Eat a healthy diet ° °Eat a diet that includes plenty of vegetables, fruits, low-fat dairy products, and lean protein. °Do not eat a lot of foods that are high in solid fats, added sugars, or sodium. °Maintain a healthy weight °Body mass index (BMI) is used to identify weight problems. It estimates body fat based on height and weight. Your health care provider can help determine your BMI and help you achieve or maintain a healthy weight. °Get regular exercise °Get regular exercise. This is one of the most important things you can do for your health. Most adults should: °Exercise for at least 150 minutes each week. The exercise should increase your heart rate and make you sweat (moderate-intensity exercise). °Do strengthening exercises at least twice a week. This is in addition to the moderate-intensity exercise. °Spend less time sitting. Even light physical activity can be beneficial. °Watch cholesterol and blood lipids °Have your blood tested for lipids and cholesterol at 80 years of age, then have this test every 5 years. °Have your cholesterol levels checked more often if: °Your lipid or cholesterol levels are high. °You are older than 80 years of age. °You are at high risk for heart  disease. °What should I know about cancer screening? °Depending on your health history and family history, you may need to have cancer screening at various ages. This may include screening for: °Breast cancer. °Cervical cancer. °Colorectal cancer. °Skin cancer. °Lung cancer. °What should I know about heart disease, diabetes, and high blood pressure? °Blood pressure and heart disease °High blood pressure causes heart disease and increases the risk of stroke. This is more likely to develop in people who have high blood pressure readings or are overweight. °Have your blood pressure checked: °Every 3-5 years if you are 18-39 years of age. °Every year if you are 40 years old or older. °Diabetes °Have regular diabetes screenings. This checks your fasting blood sugar level. Have the screening done: °Once every three years after age 40 if you are at a normal weight and have a low risk for diabetes. °More often and at a younger age if you are overweight or have a high risk for diabetes. °What should I know about preventing infection? °Hepatitis B °If you have a higher risk for hepatitis B, you should be screened for this virus. Talk with your health care provider to find out if you are at risk for hepatitis B infection. °Hepatitis C °Testing is recommended for: °Everyone born from 1945 through 1965. °Anyone with known risk factors for hepatitis C. °Sexually transmitted infections (STIs) °Get screened for STIs, including gonorrhea and chlamydia, if: °You are sexually active and are younger than 80 years of age. °You are older than 80 years of age and your health care provider   tells you that you are at risk for this type of infection. °Your sexual activity has changed since you were last screened, and you are at increased risk for chlamydia or gonorrhea. Ask your health care provider if you are at risk. °Ask your health care provider about whether you are at high risk for HIV. Your health care provider may recommend a  prescription medicine to help prevent HIV infection. If you choose to take medicine to prevent HIV, you should first get tested for HIV. You should then be tested every 3 months for as long as you are taking the medicine. °Pregnancy °If you are about to stop having your period (premenopausal) and you may become pregnant, seek counseling before you get pregnant. °Take 400 to 800 micrograms (mcg) of folic acid every day if you become pregnant. °Ask for birth control (contraception) if you want to prevent pregnancy. °Osteoporosis and menopause °Osteoporosis is a disease in which the bones lose minerals and strength with aging. This can result in bone fractures. If you are 65 years old or older, or if you are at risk for osteoporosis and fractures, ask your health care provider if you should: °Be screened for bone loss. °Take a calcium or vitamin D supplement to lower your risk of fractures. °Be given hormone replacement therapy (HRT) to treat symptoms of menopause. °Follow these instructions at home: °Alcohol use °Do not drink alcohol if: °Your health care provider tells you not to drink. °You are pregnant, may be pregnant, or are planning to become pregnant. °If you drink alcohol: °Limit how much you have to: °0-1 drink a day. °Know how much alcohol is in your drink. In the [place] one drink equals one 12 oz bottle of beer (355 mL), one 5 oz glass of wine (148 mL), or one 1½ oz glass of hard liquor (44 mL). °Lifestyle °Do not use any products that contain nicotine or tobacco. These products include cigarettes, chewing tobacco, and vaping devices, such as e-cigarettes. If you need help quitting, ask your health care provider. °Do not use street drugs. °Do not share needles. °Ask your health care provider for help if you need support or information about quitting drugs. °General instructions °Schedule regular health, dental, and eye exams. °Stay current with your vaccines. °Tell your health care provider if: °You often  feel depressed. °You have ever been abused or do not feel safe at home. °Summary °Adopting a healthy lifestyle and getting preventive care are important in promoting health and wellness. °Follow your health care provider's instructions about healthy diet, exercising, and getting tested or screened for diseases. °Follow your health care provider's instructions on monitoring your cholesterol and blood pressure. °This information is not intended to replace advice given to you by your health care provider. Make sure you discuss any questions you have with your health care provider. °Document Revised: 01/02/2021 Document Reviewed: 01/02/2021 °[employer] Patient Education © 2022 [employer] ° °

## 2021-10-24 NOTE — Progress Notes (Signed)
This visit occurred during the SARS-CoV-2 public health emergency.  Safety protocols were in place, including screening questions prior to the visit, additional usage of staff PPE, and extensive cleaning of exam room while observing appropriate contact time as indicated for disinfecting solutions.    Patient ID: Christina [name], female  DOB: [birth year], 81 y.o.   MRN: [id number] Patient Care Team    Relationship Specialty Notifications Start End  Ma Hillock, DO PCP - General Family Medicine  09/02/17   Rolm Bookbinder, MD Consulting Physician General Surgery  10/05/16   Monna Fam, MD Consulting Physician Ophthalmology  10/05/16   Garry Heater, DDS Consulting Physician Dentistry  10/05/16   Juanita Craver, MD Consulting Physician Gastroenterology  09/02/17   Volanda Napoleon, MD Medical Oncologist Oncology  05/14/19   Cleon Gustin, MD Consulting Physician Urology  01/12/21   Suella Broad, MD Consulting Physician Physical Medicine and Rehabilitation  01/12/21   Lyndal Pulley, DO Consulting Physician Sports Medicine  01/12/21     Chief Complaint  Patient presents with   Annual Exam    Pt is not fasting    Subjective: Christina [name] is a 81 y.o. female present for [place] All past medical history, surgical history, allergies, family history, immunizations, medications and social history were updated in the electronic medical record today. All recent labs, ED visits and hospitalizations within the last year were reviewed.  Health maintenance:  Colonoscopy: > 75 n/a Mammogram: Completed 11/17/2020 within normal limits.[facility]> ordered Cervical cancer screening: > 65, N/A Immunizations: Tetanus due  printed., declined flu shot.  pneumonia series completed.  declined shingrix.  Covid completed Infectious disease screening: N/A  DEXA: 08/2018 -2.2 > ordered Assistive device: none Oxygen PYP:PJKD Patient has a Dental home. Hospitalizations/ED visits:  reviewed  Situational anxiety:  Patient report she is doing ok.She has some stressors, but overall feels she is doing well and would like to keep her paxil 20 mg and ativan the same.  She rarely uses the ativan- only if she can not sleep. Has filled one 30d script in 6 mos and has some left in bottle.  Prior note: Was started on Paxil by her oncology team at the onset of diagnosis of bladder cancer. She does feel that it has been helpful along with the Ativan 0.5 mg twice daily. She reports she had a provider tell her that Zoloft is better for her than Paxil and she wanted to try zoloft. Switch was made last appt and she feels paxil was a better fit for her. She does have trouble sleeping some nights. She reports ativan  Mg qhs is usually helpful but sometimes she requires additional coverage.    Depression screen [facility] 2/9 04/25/2021 12/28/2020 05/30/2020 01/14/2020 09/16/2019  Decreased Interest 1 0 0 0 0  Down, Depressed, Hopeless 0 0 1 0 1  PHQ - 2 Score 1 0 1 0 1  Altered sleeping 1 - 0 - -  Tired, decreased energy 1 - 1 - -  Change in appetite 0 - 0 - -  Feeling bad or failure about yourself  0 - 0 - -  Trouble concentrating 0 - 0 - -  Moving slowly or fidgety/restless 0 - 0 - -  Suicidal thoughts 0 - 0 - -  PHQ-9 Score 3 - 2 - -  Some recent data might be hidden   GAD 7 : Generalized Anxiety Score 04/25/2021 05/30/2020 01/14/2020 09/16/2019  Nervous, Anxious, on Edge 1 1 0 0  Control/stop worrying 0 1 0 3  Worry too much - different things 0 1 0 3  Trouble relaxing 0 0 0 0  Restless 0 0 0 3  Easily annoyed or irritable 1 0 0 0  Afraid - awful might happen 1 0 0 0  Total GAD 7 Score 3 3 0 9  Anxiety Difficulty - - Not difficult at all Not difficult at all       Fall Risk  10/24/2021 12/28/2020 05/30/2020 02/09/2019 03/06/2018  Falls in the past year? 1 0 0 - No  Number falls in past yr: 0 0 0 - -  Injury with Fall? 0 0 0 - -  Risk for fall due to : No Fall Risks - - - -  Follow up Falls  evaluation completed Falls prevention discussed Falls evaluation completed Education provided -   Immunization History  Administered Date(s) Administered   Influenza Whole 06/07/2009, 05/28/2011, 05/28/2012   Influenza, High Dose Seasonal PF 05/20/2015   Influenza,inj,Quad PF,6+ Mos 05/26/2013, 05/31/2014   Moderna Sars-Covid-2 Vaccination 12/11/2019, 02/12/2020, 09/27/2020   Pneumococcal Conjugate-13 05/31/2014   Pneumococcal Polysaccharide-23 06/07/2009   Td 01/31/2003   Tdap 09/28/2011   Past Medical History:  Diagnosis Date   Arthritis    Asthma    a little?, no problems in several years   Bladder cancer ([place])    Bladder tumor    Cervical cancer screening 01/31/2012   Chicken pox as a child   Degenerative tear of acetabular labrum of left hip 02/26/2019   Dehydration 01/19/2020   Depression with anxiety 06/07/2009   Qualifier: Diagnosis of  By: [name] MD, Catherine     Dermatitis of external ear 07/11/2012   Diverticulosis    Essential hypertension, benign 10/13/2007   Qualifier: Diagnosis of  By: [name] MD, Catherine     Ganglion cyst 02/26/2019   Right elbow   Hiatal hernia with gastroesophageal reflux 10/26/2010   Qualifier: Diagnosis of  By: [name] MD, Catherine     Hyperglycemia 06/21/2013   Hyperlipidemia, mixed 10/16/2015   pt unaware   Hypertension    Left hip pain 07/10/2015   Lesion of breast 12/03/2016   benign; resolved   Low back pain 05/29/2015   Measles as a child   Mumps as a child   Osteopenia 01/03/2017   Overweight (BMI 25.0-29.9) 10/07/2008   Qualifier: Diagnosis of  By: [name] MD, [name]     Palpitations    several years ago, not currently   Pre-diabetes    Psoriasis    ears   RUQ pain 05/29/2015   Spider veins    Bilateral legs   Spinal stenosis    Tailor's bunionette, left 09/01/9676   Umbilical hernia    Urinary frequency 09/28/2011   Vertigo    Wears dentures    Upper,   Allergies  Allergen Reactions   Nutrasweet Aspartame  [Aspartame] Diarrhea   Lisinopril Cough   Past Surgical History:  Procedure Laterality Date   ABDOMINAL SURGERY  1970's   BREAST BIOPSY Right 1980   BREAST EXCISIONAL BIOPSY   BREAST BIOPSY Left 1970   BREAST EXCISIONAL BIOPSY   BREAST CYST ASPIRATION     CATARACT EXTRACTION, BILATERAL     COLONOSCOPY     CYSTECTOMY     abdomen   CYSTOSCOPY W/ RETROGRADES Bilateral 04/27/2019   Procedure: CYSTOSCOPY WITH RETROGRADE PYELOGRAM;  Surgeon: Cleon Gustin, MD;  Location: [facility];  Service: Urology;  Laterality: Bilateral;   EYE  SURGERY Bilateral    cataract   INSERTION OF MESH N/A 06/11/2017   Procedure: INSERTION OF MESH;  Surgeon: Rolm Bookbinder, MD;  Location: [place];  Service: General;  Laterality: N/A;  BILATERAL TAP BLOCK   IR REMOVAL TUN ACCESS W/ PORT W/O FL MOD SED  11/09/2020   TRANSURETHRAL RESECTION OF BLADDER TUMOR N/A 04/27/2019   Procedure: TRANSURETHRAL RESECTION OF BLADDER TUMOR (TURBT);  Surgeon: Cleon Gustin, MD;  Location: [facility];  Service: Urology;  Laterality: N/A;  1 HR   TRANSURETHRAL RESECTION OF BLADDER TUMOR N/A 06/01/2019   Procedure: TRANSURETHRAL RESECTION OF BLADDER TUMOR (TURBT);  Surgeon: Cleon Gustin, MD;  Location: [facility];  Service: Urology;  Laterality: N/A;  1 HR   TRANSURETHRAL RESECTION OF BLADDER TUMOR N/A 10/22/2019   Procedure: TRANSURETHRAL RESECTION OF BLADDER TUMOR (TURBT);  Surgeon: Cleon Gustin, MD;  Location: [facility];  Service: Urology;  Laterality: N/A;   TUBAL LIGATION  1970   VENTRAL HERNIA REPAIR N/A 06/11/2017   Procedure: LAPAROSCOPIC VENTRAL HERNIA REPAIR WITH MESH ERAS PATHWAY;  Surgeon: Rolm Bookbinder, MD;  Location: [place];  Service: General;  Laterality: N/A;  BILATERAL TAP BLOCK   Family History  Problem Relation Age of Onset   Hypertension Mother    Anxiety disorder Mother    Ovarian cancer Mother 84       lung- smoker,  ovarian   Lung cancer Mother        smoker   Arthritis Mother    Hyperlipidemia Mother    Alcohol abuse Father    Diabetes Sister    Hypertension Sister    Ovarian cancer Sister    Arthritis Sister    Depression Sister    Hyperlipidemia Sister    Other Maternal Grandmother        pacemaker   Multiple sclerosis Maternal Grandfather        ?   Arthritis Brother    COPD Brother    Heart attack Brother    Hyperlipidemia Brother    Stroke Brother    Asthma Sister    Depression Sister    Hyperlipidemia Sister    Breast cancer Sister    Asthma Sister    Depression Sister    Asthma Paternal Uncle    Social History   Social History Narrative   Retired. Lives alone.    Attended some business college.    Former smoker.    Smoke alarm in the home. Wears seat balt.    Wears dentures.    Feels safe in her relationships.        Allergies as of 10/24/2021       Reactions   Nutrasweet Aspartame [aspartame] Diarrhea   Lisinopril Cough        Medication List        Accurate as of October 24, 2021 10:47 AM. If you have any questions, ask your nurse or doctor.          KLOR-CON PO Take 40 mEq by mouth daily.   LORazepam 0.5 MG tablet Commonly known as: ATIVAN Take 1 tablet (0.5 mg total) by mouth 2 (two) times daily as needed for anxiety.   magnesium oxide 400 MG tablet Commonly known as: MAG-OX Take by mouth daily.   PARoxetine 20 MG tablet Commonly known as: PAXIL Take 1 tablet (20 mg total) by mouth daily.   rivaroxaban 20 MG Tabs tablet Commonly known as: XARELTO Take 1 tablet (20  mg total) by mouth daily with supper. What changed: Another medication with the same name was removed. Continue taking this medication, and follow the directions you see here. Changed by: Howard Pouch, DO   Tdap 5-2.5-18.5 LF-MCG/0.5 injection Commonly known as: BOOSTRIX Inject 0.5 mLs into the muscle once for 1 dose. Started by: Howard Pouch, DO   VITAMIN D PO Take by  mouth.       All past medical history, surgical history, allergies, family history, immunizations andmedications were updated in the EMR today and reviewed under the history and medication portions of their EMR.       ROS 14 pt review of systems performed and negative (unless mentioned in an HPI)  Objective:  BP 94/60    Pulse 74    Temp 97.7 F (36.5 C) (Oral)    Ht 5\' 5"  (1.651 m)    Wt 169 lb (76.7 kg)    SpO2 96%    BMI 28.12 kg/m  Physical Exam Vitals and nursing note reviewed.  Constitutional:      General: She is not in acute distress.    Appearance: Normal appearance. She is not ill-appearing or toxic-appearing.  HENT:     Head: Normocephalic and atraumatic.     Right Ear: Tympanic membrane, ear canal and external ear normal. There is no impacted cerumen.     Left Ear: Tympanic membrane, ear canal and external ear normal. There is no impacted cerumen.     Nose: No congestion or rhinorrhea.     Mouth/Throat:     Mouth: Mucous membranes are moist.     Pharynx: Oropharynx is clear. No oropharyngeal exudate or posterior oropharyngeal erythema.  Eyes:     General: No scleral icterus.       Right eye: No discharge.        Left eye: No discharge.     Extraocular Movements: Extraocular movements intact.     Conjunctiva/sclera: Conjunctivae normal.     Pupils: Pupils are equal, round, and reactive to light.  Cardiovascular:     Rate and Rhythm: Normal rate and regular rhythm.     Pulses: Normal pulses.     Heart sounds: Normal heart sounds. No murmur heard.   No friction rub. No gallop.  Pulmonary:     Effort: Pulmonary effort is normal. No respiratory distress.     Breath sounds: Normal breath sounds. No stridor. No wheezing, rhonchi or rales.  Chest:     Chest wall: No tenderness.  Abdominal:     General: Abdomen is flat. Bowel sounds are normal. There is no distension.     Palpations: Abdomen is soft. There is no mass.     Tenderness: There is no abdominal  tenderness. There is no right CVA tenderness, left CVA tenderness, guarding or rebound.     Hernia: No hernia is present.  Musculoskeletal:        General: No swelling, tenderness or deformity. Normal range of motion.     Cervical back: Normal range of motion and neck supple. No rigidity or tenderness.     Right lower leg: No edema.     Left lower leg: No edema.  Lymphadenopathy:     Cervical: No cervical adenopathy.  Skin:    General: Skin is warm and dry.     Coloration: Skin is not jaundiced or pale.     Findings: No bruising, erythema, lesion or rash.  Neurological:     General: No focal deficit present.  Mental Status: She is alert and oriented to person, place, and time. Mental status is at baseline.     Cranial Nerves: No cranial nerve deficit.     Sensory: No sensory deficit.     Motor: No weakness.     Coordination: Coordination normal.     Gait: Gait normal.     Deep Tendon Reflexes: Reflexes normal.  Psychiatric:        Mood and Affect: Mood normal.        Behavior: Behavior normal.        Thought Content: Thought content normal.        Judgment: Judgment normal.    No results found.  Assessment/plan: Christina [name] is a 81 y.o. female present for CPE Osteopenia, unspecified location - VITAMIN D 25 Hydroxy (Vit-D Deficiency, Fractures) - DG Bone Density; Future Vitamin D deficiency - VITAMIN D 25 Hydroxy (Vit-D Deficiency, Fractures) - DG Bone Density; Future Elevated hemoglobin A1c/BMI 28.0-28.9,adult - Lipid panel - Hemoglobin A1c - TSH Situational anxiety Stable.  Continue paxil 20 mg qd/  Ativan qhs prn- NCCs database reviewed.  F/u 5.5 mos.   History of bladder cancer Stable. Following with onc Breast cancer screening by mammogram - MM 3D SCREEN BREAST BILATERAL; Future Encounter for Preventive Health care exam: Mammogram: Completed 11/17/2020 within normal limits.[facility]> ordered Cervical cancer screening: > 65, N/A Immunizations: Tetanus  due  printed., declined flu shot.  pneumonia series completed.  declined shingrix.  Covid completed Infectious disease screening: N/A  DEXA: 08/2018 -2.2 > ordered Patient was encouraged to exercise greater than 150 minutes a week. Patient was encouraged to choose a diet filled with fresh fruits and vegetables, and lean meats. AVS provided to patient today for education/recommendation on gender specific health and safety maintenance. Return in about 24 weeks (around 04/10/2022) for [facility] (30 min).  Orders Placed This Encounter  Procedures   MM 3D SCREEN BREAST BILATERAL   DG Bone Density   Lipid panel   Hemoglobin A1c   TSH   VITAMIN D 25 Hydroxy (Vit-D Deficiency, Fractures)    Meds ordered this encounter  Medications   Tdap (BOOSTRIX) 5-2.5-18.5 LF-MCG/0.5 injection    Sig: Inject 0.5 mLs into the muscle once for 1 dose.    Dispense:  0.5 mL    Refill:  0   Referral Orders  No referral(s) requested today     Note is dictated utilizing voice recognition software. Although note has been proof read prior to signing, occasional typographical errors still can be missed. If any questions arise, please do not hesitate to call for verification.  Electronically signed by: Howard Pouch, DO [place]

## 2021-11-06 DIAGNOSIS — H40013 Open angle with borderline findings, low risk, bilateral: Secondary | ICD-10-CM | POA: Diagnosis not present

## 2021-11-21 DIAGNOSIS — H40013 Open angle with borderline findings, low risk, bilateral: Secondary | ICD-10-CM | POA: Diagnosis not present

## 2021-11-21 DIAGNOSIS — H18593 Other hereditary corneal dystrophies, bilateral: Secondary | ICD-10-CM | POA: Diagnosis not present

## 2021-11-22 ENCOUNTER — Other Ambulatory Visit: Payer: Medicare PPO

## 2021-11-23 ENCOUNTER — Ambulatory Visit (INDEPENDENT_AMBULATORY_CARE_PROVIDER_SITE_OTHER): Payer: Medicare PPO

## 2021-11-23 DIAGNOSIS — Z1231 Encounter for screening mammogram for malignant neoplasm of breast: Secondary | ICD-10-CM

## 2021-11-28 ENCOUNTER — Telehealth: Payer: Self-pay

## 2021-11-28 ENCOUNTER — Encounter: Payer: Self-pay | Admitting: *Deleted

## 2021-11-28 DIAGNOSIS — Z8551 Personal history of malignant neoplasm of bladder: Secondary | ICD-10-CM

## 2021-11-28 NOTE — Telephone Encounter (Signed)
Pt called to confirm if she was getting a CT scan this month, called pt back and informed her the order was placed. Just waiting on insurance to approve it and then they will call her to schedule it. Pt  confirmed and denies any other questions at this time ?

## 2021-11-28 NOTE — Progress Notes (Signed)
Patient calling because she went to pick up CT contrast for her upcoming CT and was told there were no orders. ? ?Spoke to Dr Marin Olp and he would like patient to have a scan prior to her next appointment. Order placed. Patient notified that there is now an order and she can schedule.  ? ?Oncology Nurse Navigator Documentation ? ? ?  11/28/2021  ? 12:00 PM  ?Oncology Nurse Navigator Flowsheets  ?Navigator Location [facility]  ?Navigator Encounter Type Telephone  ?Telephone Appt Confirmation/Clarification;Incoming Call  ?Patient Visit Type MedOnc  ?Treatment Phase Post-Tx Follow-up  ?Barriers/Navigation Needs Coordination of Care  ?Interventions Coordination of Care  ?Acuity Level 2-Minimal Needs (1-2 Barriers Identified)  ?Coordination of Care Radiology  ?Support Groups/Services Friends and Family  ?Time Spent with Patient 15  ? ? ? ? ?

## 2021-11-29 ENCOUNTER — Ambulatory Visit (INDEPENDENT_AMBULATORY_CARE_PROVIDER_SITE_OTHER): Payer: Medicare PPO

## 2021-11-29 DIAGNOSIS — E559 Vitamin D deficiency, unspecified: Secondary | ICD-10-CM | POA: Diagnosis not present

## 2021-11-29 DIAGNOSIS — M8580 Other specified disorders of bone density and structure, unspecified site: Secondary | ICD-10-CM | POA: Diagnosis not present

## 2021-11-29 DIAGNOSIS — Z780 Asymptomatic menopausal state: Secondary | ICD-10-CM | POA: Diagnosis not present

## 2021-11-29 DIAGNOSIS — M810 Age-related osteoporosis without current pathological fracture: Secondary | ICD-10-CM | POA: Diagnosis not present

## 2021-11-29 DIAGNOSIS — M85832 Other specified disorders of bone density and structure, left forearm: Secondary | ICD-10-CM | POA: Diagnosis not present

## 2021-11-30 ENCOUNTER — Telehealth: Payer: Self-pay | Admitting: Family Medicine

## 2021-11-30 NOTE — Telephone Encounter (Signed)
Spoke with patient regarding results/recommendations. Pt declined starting medication at this time ?

## 2021-11-30 NOTE — Telephone Encounter (Signed)
Please inform patient her bone density is now in the osteoporotic range. ?There has been a mild decrease in the density from -2.2 last time, now is -2.7. ? ?In the past she had declined starting of medication called Fosamax.  If she would like to reconsider and start this medication we can call this in for her.  It is a once weekly medication that must be taken on an empty stomach with a full glass of water once weekly. ?

## 2021-12-26 ENCOUNTER — Encounter (HOSPITAL_BASED_OUTPATIENT_CLINIC_OR_DEPARTMENT_OTHER): Payer: Self-pay

## 2021-12-26 ENCOUNTER — Inpatient Hospital Stay: Payer: Medicare PPO | Attending: Hematology & Oncology | Admitting: Hematology & Oncology

## 2021-12-26 ENCOUNTER — Inpatient Hospital Stay: Payer: Medicare PPO

## 2021-12-26 ENCOUNTER — Telehealth: Payer: Self-pay | Admitting: *Deleted

## 2021-12-26 ENCOUNTER — Other Ambulatory Visit: Payer: Medicare PPO

## 2021-12-26 ENCOUNTER — Ambulatory Visit (HOSPITAL_BASED_OUTPATIENT_CLINIC_OR_DEPARTMENT_OTHER)
Admission: RE | Admit: 2021-12-26 | Discharge: 2021-12-26 | Disposition: A | Discharge status: Home / Self Care | Payer: Medicare PPO | Source: Ambulatory Visit | Attending: Hematology & Oncology | Admitting: Hematology & Oncology

## 2021-12-26 ENCOUNTER — Encounter: Payer: Self-pay | Admitting: Hematology & Oncology

## 2021-12-26 VITALS — BP 176/82 | HR 73 | Temp 97.9°F | Resp 18 | Ht 65.0 in | Wt 172.0 lb

## 2021-12-26 DIAGNOSIS — I2510 Atherosclerotic heart disease of native coronary artery without angina pectoris: Secondary | ICD-10-CM | POA: Diagnosis not present

## 2021-12-26 DIAGNOSIS — Z8551 Personal history of malignant neoplasm of bladder: Secondary | ICD-10-CM

## 2021-12-26 DIAGNOSIS — C679 Malignant neoplasm of bladder, unspecified: Secondary | ICD-10-CM | POA: Diagnosis not present

## 2021-12-26 DIAGNOSIS — I700 Atherosclerosis of aorta: Secondary | ICD-10-CM | POA: Insufficient documentation

## 2021-12-26 DIAGNOSIS — Z906 Acquired absence of other parts of urinary tract: Secondary | ICD-10-CM | POA: Diagnosis not present

## 2021-12-26 DIAGNOSIS — D3500 Benign neoplasm of unspecified adrenal gland: Secondary | ICD-10-CM | POA: Diagnosis not present

## 2021-12-26 LAB — CBC WITH DIFFERENTIAL (CANCER CENTER ONLY)
Abs Immature Granulocytes: 0 10*3/uL (ref 0.00–0.07)
Basophils Absolute: 0 10*3/uL (ref 0.0–0.1)
Basophils Relative: 0 %
Eosinophils Absolute: 0.1 10*3/uL (ref 0.0–0.5)
Eosinophils Relative: 2 %
HCT: 39.5 % (ref 36.0–46.0)
Hemoglobin: 12.9 g/dL (ref 12.0–15.0)
Immature Granulocytes: 0 %
Lymphocytes Relative: 42 %
Lymphs Abs: 2.2 10*3/uL (ref 0.7–4.0)
MCH: 32.4 pg (ref 26.0–34.0)
MCHC: 32.7 g/dL (ref 30.0–36.0)
MCV: 99.2 fL (ref 80.0–100.0)
Monocytes Absolute: 0.5 10*3/uL (ref 0.1–1.0)
Monocytes Relative: 9 %
Neutro Abs: 2.5 10*3/uL (ref 1.7–7.7)
Neutrophils Relative %: 47 %
Platelet Count: 193 10*3/uL (ref 150–400)
RBC: 3.98 MIL/uL (ref 3.87–5.11)
RDW: 13.2 % (ref 11.5–15.5)
WBC Count: 5.3 10*3/uL (ref 4.0–10.5)
nRBC: 0 % (ref 0.0–0.2)

## 2021-12-26 LAB — CMP (CANCER CENTER ONLY)
ALT: 8 U/L (ref 0–44)
AST: 15 U/L (ref 15–41)
Albumin: 4 g/dL (ref 3.5–5.0)
Alkaline Phosphatase: 67 U/L (ref 38–126)
Anion gap: 6 (ref 5–15)
BUN: 17 mg/dL (ref 8–23)
CO2: 29 mmol/L (ref 22–32)
Calcium: 10.2 mg/dL (ref 8.9–10.3)
Chloride: 105 mmol/L (ref 98–111)
Creatinine: 1.07 mg/dL — ABNORMAL HIGH (ref 0.44–1.00)
GFR, Estimated: 53 mL/min — ABNORMAL LOW (ref >60)
Glucose, Bld: 118 mg/dL — ABNORMAL HIGH (ref 70–99)
Potassium: 5 mmol/L (ref 3.5–5.1)
Sodium: 140 mmol/L (ref 135–145)
Total Bilirubin: 0.7 mg/dL (ref 0.3–1.2)
Total Protein: 6.4 g/dL — ABNORMAL LOW (ref 6.5–8.1)

## 2021-12-26 MED ORDER — IOHEXOL 300 MG/ML  SOLN
100.0000 mL | Freq: Once | INTRAMUSCULAR | Status: AC | PRN
  Administered 2021-12-26: 100 mL via INTRAVENOUS

## 2021-12-26 MED ORDER — RIVAROXABAN 10 MG PO TABS: 10.0000 mg | ORAL_TABLET | Freq: Every day | ORAL | 3 refills | Status: DC

## 2021-12-26 NOTE — Telephone Encounter (Signed)
Per 12/26/21 los - gave upcoming appointments - confirmed ?

## 2021-12-26 NOTE — Progress Notes (Signed)
?Hematology and Oncology Follow Up Visit ? ?TOMIA ENLOW ?[id number] ?04/19/1941 81 y.o. ?12/26/2021 ? ? ?Principle Diagnosis:  ?Stage IIIB ([id number]) invasive urothelial carcinoma of the bladder  ?Subsegmental blood clot in right lower lung ? ?Current Therapy:   ?Status post neoadjuvant chemotherapy with ddMVAC ?Radical cystectomy on 03/03/2020  ?Xarelto 20 mg p.o. daily-6 months of therapy to start on 06/30/2021 -  changed to Xarelto 10 mg po q day on 12/26/2021 ?    ?Interim History:  Ms. Teagarden is back for follow-up.  Her main problem has been the left hip area.  She had the surgery for this last year.  She says that the pain is far worse now than when she had the arthritis.  She is going to see Dr. Rhona Raider of Orthopedic Surgery to see if he can help. ? ?She had a CT scan done today.  Unfortunately, we do not have the results back as of yet. ? ?She is on Xarelto.  We will put her on 10 mg of Xarelto as a maintenance for 1 year now. ? ?She had a birthday and over the weekend.  She had a wonderful birthday.  It was a surprise party. ? ?She has had no cough or shortness of breath.  She has had no change in bowel or bladder habits.  She has had no leg swelling.  There is been no rashes. ? ?Overall, I would say performance status is ECOG 1.    ? ?Medications:  ?Current Outpatient Medications:  ?  magnesium oxide (MAG-OX) 400 MG tablet, Take by mouth daily., Disp: , Rfl:  ?  PARoxetine (PAXIL) 20 MG tablet, Take 1 tablet (20 mg total) by mouth daily., Disp: 90 tablet, Rfl: 1 ?  Potassium Chloride (KLOR-CON PO), Take 40 mEq by mouth daily., Disp: , Rfl:  ?  rivaroxaban (XARELTO) 20 MG TABS tablet, Take 1 tablet (20 mg total) by mouth daily with supper., Disp: 30 tablet, Rfl: 5 ?  VITAMIN D PO, Take by mouth., Disp: , Rfl:  ?  LORazepam (ATIVAN) 0.5 MG tablet, Take 1 tablet (0.5 mg total) by mouth 2 (two) times daily as needed for anxiety. (Patient not taking: Reported on 12/26/2021), Disp: 60 tablet, Rfl: 5 ? ?Allergies:   ?Allergies  ?Allergen Reactions  ? Nutrasweet Aspartame [Aspartame] Diarrhea  ? Lisinopril Cough  ? ? ?Past Medical History, Surgical history, Social history, and Family History were reviewed and updated. ? ?Review of Systems: ?Review of Systems  ?Constitutional: Negative.   ?HENT:  Negative.    ?Eyes: Negative.   ?Respiratory: Negative.    ?Cardiovascular: Negative.   ?Gastrointestinal: Negative.   ?Endocrine: Negative.   ?Genitourinary: Negative.    ?Musculoskeletal: Negative.   ?Skin: Negative.   ?Neurological: Negative.   ?Hematological: Negative.   ?Psychiatric/Behavioral: Negative.    ? ?Physical Exam: ? height is '5\' 5"'$  (1.651 m) and weight is 172 lb (78 kg). Her oral temperature is 97.9 ?F (36.6 ?C). Her blood pressure is 176/82 (abnormal) and her pulse is 73. Her respiration is 18 and oxygen saturation is 100%.  ? ?Wt Readings from Last 3 Encounters:  ?12/26/21 172 lb (78 kg)  ?10/24/21 169 lb (76.7 kg)  ?09/26/21 166 lb 1.3 oz (75.3 kg)  ? ? ?Physical Exam ?Vitals reviewed.  ?HENT:  ?   Head: Normocephalic and atraumatic.  ?Eyes:  ?   Pupils: Pupils are equal, round, and reactive to light.  ?Cardiovascular:  ?   Rate and Rhythm: Normal rate and regular  rhythm.  ?   Heart sounds: Normal heart sounds.  ?Pulmonary:  ?   Effort: Pulmonary effort is normal.  ?   Breath sounds: Normal breath sounds.  ?Abdominal:  ?   General: Bowel sounds are normal.  ?   Palpations: Abdomen is soft.  ?   Comments: Abdominal exam shows the healed laparotomy scar in the midline.  She has some firmness in the middle of the laparotomy scar.  She has the urostomy bag which is well-positioned.  Urine is clear.  There is no fluid wave in the abdomen.  Bowel sounds are present.  She has no palpable liver or spleen tip.  ?Musculoskeletal:     ?   General: No tenderness or deformity. Normal range of motion.  ?   Cervical back: Normal range of motion.  ?Lymphadenopathy:  ?   Cervical: No cervical adenopathy.  ?Skin: ?   General: Skin is  warm and dry.  ?   Findings: No erythema or rash.  ?Neurological:  ?   Mental Status: She is alert and oriented to person, place, and time.  ?Psychiatric:     ?   Behavior: Behavior normal.     ?   Thought Content: Thought content normal.     ?   Judgment: Judgment normal.  ? ? ?Lab Results  ?Component Value Date  ? WBC 5.3 12/26/2021  ? HGB 12.9 12/26/2021  ? HCT 39.5 12/26/2021  ? MCV 99.2 12/26/2021  ? PLT 193 12/26/2021  ? ?  Chemistry   ?   ?Component Value Date/Time  ? NA 140 12/26/2021 1057  ? NA 142 03/04/2017 1016  ? K 5.0 12/26/2021 1057  ? CL 105 12/26/2021 1057  ? CO2 29 12/26/2021 1057  ? BUN 17 12/26/2021 1057  ? BUN 13 03/04/2017 1016  ? CREATININE 1.07 (H) 12/26/2021 1057  ? CREATININE 0.78 05/20/2015 1704  ?    ?Component Value Date/Time  ? CALCIUM 10.2 12/26/2021 1057  ? ALKPHOS 67 12/26/2021 1057  ? AST 15 12/26/2021 1057  ? ALT 8 12/26/2021 1057  ? BILITOT 0.7 12/26/2021 1057  ?  ? ? ?Impression and Plan: ?Ms. Casebier is a 81 year old white female.  She had superficial bladder cancer.  She was treated with intravesicular therapy.  However, she has had had muscle invasive cancer which was poorly differentiated.  She then underwent neoadjuvant chemotherapy followed by radical cystectomy.  She is found to have stage IIIB disease. ? ?She definitely has a high risk of recurrence.  We will see what the CT scan shows.  The CT scan does not show any evidence of recurrent disease, then maybe we can move her scans out to every 4 months or so. ? ?We will switch over to maintenance Xarelto now.  I will put her on 10 mg a day.  She will need this for 1 year. ? ?I will let have her come back to see me in 3 months.  If there is similar on the CAT scan that is troublesome, we will get her back sooner.  .   ? ?Volanda Napoleon, MD ?5/2/202312:40 PM ?

## 2021-12-27 ENCOUNTER — Telehealth: Payer: Self-pay

## 2021-12-27 NOTE — Telephone Encounter (Signed)
-----   Message from Volanda Napoleon, MD sent at 12/26/2021  4:16 PM EDT ----- ?Call - the CT scan is normal!!  No cancer!!  Laurey Arrow ?

## 2021-12-27 NOTE — Telephone Encounter (Signed)
Per Dr. Marin Olp request pt was called and told that her CT scan was normal. Pt aware that she has no cancer at this time. Pt very appreciative of call. Pt aware to call with any questions or concerns. Pt verbalized understanding and had no further questions.  ?

## 2022-01-03 ENCOUNTER — Ambulatory Visit (INDEPENDENT_AMBULATORY_CARE_PROVIDER_SITE_OTHER): Payer: Medicare PPO

## 2022-01-03 VITALS — BP 122/62 | HR 80 | Temp 98.1°F | Wt 171.8 lb

## 2022-01-03 DIAGNOSIS — Z0000 Encounter for general adult medical examination without abnormal findings: Secondary | ICD-10-CM | POA: Diagnosis not present

## 2022-01-03 NOTE — Patient Instructions (Signed)
Christina [name] , ?Thank you for taking time to come for your Medicare Wellness Visit. I appreciate your ongoing commitment to your health goals. Please review the following plan we discussed and let me know if I can assist you in the future.  ? ?Screening recommendations/referrals: ?Colonoscopy: No longer required  ?Mammogram: Done 11/23/21 repeat every year  ?Bone Density: Done 11/29/21 repeat every 2 years  ?Recommended yearly ophthalmology/optometry visit for glaucoma screening and checkup ?Recommended yearly dental visit for hygiene and checkup ? ?Vaccinations ?Influenza vaccine: Declined and  discussed  ?Pneumococcal vaccine: Up to date ?Tdap vaccine: Done 10/24/21 repeat every 10 years ?Shingles vaccine: Shingrix discussed. Please contact your pharmacy for coverage information.    ?Covid-19:Completed 4/16, 02/12/20 & 09/27/20 ? ?Advanced directives: Please bring a copy of your health care power of attorney and living will to the office at your convenience. ? ?Conditions/risks identified: Get rid of pain  ? ?Next appointment: Follow up in one year for your annual wellness visit  ? ? ?Preventive Care 80 Years and Older, Female ?Preventive care refers to lifestyle choices and visits with your health care provider that can promote health and wellness. ?What does preventive care include? ?A yearly physical exam. This is also called an annual well check. ?Dental exams once or twice a year. ?Routine eye exams. Ask your health care provider how often you should have your eyes checked. ?Personal lifestyle choices, including: ?Daily care of your teeth and gums. ?Regular physical activity. ?Eating a healthy diet. ?Avoiding tobacco and drug use. ?Limiting alcohol use. ?Practicing safe sex. ?Taking low-dose aspirin every day. ?Taking vitamin and mineral supplements as recommended by your health care provider. ?What happens during an annual well check? ?The services and screenings done by your health care provider during your annual  well check will depend on your age, overall health, lifestyle risk factors, and family history of disease. ?Counseling  ?Your health care provider may ask you questions about your: ?Alcohol use. ?Tobacco use. ?Drug use. ?Emotional well-being. ?Home and relationship well-being. ?Sexual activity. ?Eating habits. ?History of falls. ?Memory and ability to understand (cognition). ?Work and work Statistician. ?Reproductive health. ?Screening  ?You may have the following tests or measurements: ?Height, weight, and BMI. ?Blood pressure. ?Lipid and cholesterol levels. These may be checked every 5 years, or more frequently if you are over 50 years old. ?Skin check. ?Lung cancer screening. You may have this screening every year starting at age 58 if you have a 30-pack-year history of smoking and currently smoke or have quit within the past 15 years. ?Fecal occult blood test (FOBT) of the stool. You may have this test every year starting at age 35. ?Flexible sigmoidoscopy or colonoscopy. You may have a sigmoidoscopy every 5 years or a colonoscopy every 10 years starting at age 43. ?Hepatitis C blood test. ?Hepatitis B blood test. ?Sexually transmitted disease (STD) testing. ?Diabetes screening. This is done by checking your blood sugar (glucose) after you have not eaten for a while (fasting). You may have this done every 1-3 years. ?Bone density scan. This is done to screen for osteoporosis. You may have this done starting at age 50. ?Mammogram. This may be done every 1-2 years. Talk to your health care provider about how often you should have regular mammograms. ?Talk with your health care provider about your test results, treatment options, and if necessary, the need for more tests. ?Vaccines  ?Your health care provider may recommend certain vaccines, such as: ?Influenza vaccine. This is recommended every year. ?  Tetanus, diphtheria, and acellular pertussis (Tdap, Td) vaccine. You may need a Td booster every 10 years. ?Zoster  vaccine. You may need this after age 39. ?Pneumococcal 13-valent conjugate (PCV13) vaccine. One dose is recommended after age 96. ?Pneumococcal polysaccharide (PPSV23) vaccine. One dose is recommended after age 72. ?Talk to your health care provider about which screenings and vaccines you need and how often you need them. ?This information is not intended to replace advice given to you by your health care provider. Make sure you discuss any questions you have with your health care provider. ?Document Released: 09/09/2015 Document Revised: 05/02/2016 Document Reviewed: 06/14/2015 ?Elsevier Interactive Patient Education ? 2017 [place]. ? ?Fall Prevention in the Home ?Falls can cause injuries. They can happen to people of all ages. There are many things you can do to make your home safe and to help prevent falls. ?What can I do on the outside of my home? ?Regularly fix the edges of walkways and driveways and fix any cracks. ?Remove anything that might make you trip as you walk through a door, such as a raised step or threshold. ?Trim any bushes or trees on the path to your home. ?Use bright outdoor lighting. ?Clear any walking paths of anything that might make someone trip, such as rocks or tools. ?Regularly check to see if handrails are loose or broken. Make sure that both sides of any steps have handrails. ?Any raised decks and porches should have guardrails on the edges. ?Have any leaves, snow, or ice cleared regularly. ?Use sand or salt on walking paths during winter. ?Clean up any spills in your garage right away. This includes oil or grease spills. ?What can I do in the bathroom? ?Use night lights. ?Install grab bars by the toilet and in the tub and shower. Do not use towel bars as grab bars. ?Use non-skid mats or decals in the tub or shower. ?If you need to sit down in the shower, use a plastic, non-slip stool. ?Keep the floor dry. Clean up any water that spills on the floor as soon as it happens. ?Remove  soap buildup in the tub or shower regularly. ?Attach bath mats securely with double-sided non-slip rug tape. ?Do not have throw rugs and other things on the floor that can make you trip. ?What can I do in the bedroom? ?Use night lights. ?Make sure that you have a light by your bed that is easy to reach. ?Do not use any sheets or blankets that are too big for your bed. They should not hang down onto the floor. ?Have a firm chair that has side arms. You can use this for support while you get dressed. ?Do not have throw rugs and other things on the floor that can make you trip. ?What can I do in the kitchen? ?Clean up any spills right away. ?Avoid walking on wet floors. ?Keep items that you use a lot in easy-to-reach places. ?If you need to reach something above you, use a strong step stool that has a grab bar. ?Keep electrical cords out of the way. ?Do not use floor polish or wax that makes floors slippery. If you must use wax, use non-skid floor wax. ?Do not have throw rugs and other things on the floor that can make you trip. ?What can I do with my stairs? ?Do not leave any items on the stairs. ?Make sure that there are handrails on both sides of the stairs and use them. Fix handrails that are broken or loose.  Make sure that handrails are as long as the stairways. ?Check any carpeting to make sure that it is firmly attached to the stairs. Fix any carpet that is loose or worn. ?Avoid having throw rugs at the top or bottom of the stairs. If you do have throw rugs, attach them to the floor with carpet tape. ?Make sure that you have a light switch at the top of the stairs and the bottom of the stairs. If you do not have them, ask someone to add them for you. ?What else can I do to help prevent falls? ?Wear shoes that: ?Do not have high heels. ?Have rubber bottoms. ?Are comfortable and fit you well. ?Are closed at the toe. Do not wear sandals. ?If you use a stepladder: ?Make sure that it is fully opened. Do not climb a  closed stepladder. ?Make sure that both sides of the stepladder are locked into place. ?Ask someone to hold it for you, if possible. ?Clearly mark and make sure that you can see: ?Any grab bars or handra

## 2022-01-03 NOTE — Progress Notes (Signed)
? ?Subjective:  ? Christina [name] is a 81 y.o. female who presents for Medicare Annual (Subsequent) preventive examination. ? ?Review of Systems    ? ?Cardiac Risk Factors include: advanced age (>72mn, >>33women) ? ?   ?Objective:  ?  ?Today's Vitals  ? 01/03/22 1333  ?BP: 122/62  ?Pulse: 80  ?Temp: 98.1 ?F (36.7 ?C)  ?SpO2: 96%  ?Weight: 171 lb 12.8 oz (77.9 kg)  ?PainSc: 7   ? ?Body mass index is 28.59 kg/m?. ? ? ?  01/03/2022  ?  1:40 PM 12/26/2021  ? 12:20 PM 09/26/2021  ? 11:49 AM 06/30/2021  ? 11:32 AM 03/24/2021  ? 10:36 AM 12/28/2020  ?  1:38 PM 12/15/2020  ? 12:17 PM  ?Advanced Directives  ?Does Patient Have a Medical Advance Directive? Yes Yes Yes Yes Yes Yes Yes  ?Type of AParamedicof ASimsbury CenterLiving will HTaborLiving will HOostburgLiving will H[place]burgLiving will HRennerdaleLiving will HG[place]Living will HEmersonLiving will  ?Does patient want to make changes to medical advance directive?  No - Patient declined     No - Patient declined  ?Copy of [place] Fallsburgin Chart? No - copy requested No - copy requested    No - copy requested No - copy requested  ? ? ?Current Medications (verified) ?Outpatient Encounter Medications as of 01/03/2022  ?Medication Sig  ? LORazepam (ATIVAN) 0.5 MG tablet Take 1 tablet (0.5 mg total) by mouth 2 (two) times daily as needed for anxiety.  ? magnesium oxide (MAG-OX) 400 MG tablet Take by mouth daily.  ? PARoxetine (PAXIL) 20 MG tablet Take 1 tablet (20 mg total) by mouth daily.  ? Potassium Chloride (KLOR-CON PO) Take 40 mEq by mouth daily.  ? rivaroxaban (XARELTO) 10 MG TABS tablet Take 1 tablet (10 mg total) by mouth daily.  ? VITAMIN D PO Take by mouth.  ? ?No facility-administered encounter medications on file as of 01/03/2022.  ? ? ?Allergies (verified) ?Nutrasweet aspartame [aspartame] and Lisinopril   ? ?History: ?Past Medical History:  ?Diagnosis Date  ? Arthritis   ? Asthma   ? a little?, no problems in several years  ? [place]   ? Bladder tumor   ? Cervical cancer screening 01/31/2012  ? Chicken pox as a child  ? Degenerative tear of acetabular labrum of left hip 02/26/2019  ? Dehydration 01/19/2020  ? Depression with anxiety 06/07/2009  ? Qualifier: Diagnosis of  By: MMadilyn FiremanMD, CBarnetta Chapel   ? Dermatitis of external ear 07/11/2012  ? Diverticulosis   ? Essential hypertension, benign 10/13/2007  ? Qualifier: Diagnosis of  By: MMadilyn FiremanMD, CBarnetta Chapel   ? Ganglion cyst 02/26/2019  ? Right elbow  ? Hiatal hernia with gastroesophageal reflux 10/26/2010  ? Qualifier: Diagnosis of  By: MMadilyn FiremanMD, CBarnetta Chapel   ? Hyperglycemia 06/21/2013  ? Hyperlipidemia, mixed 10/16/2015  ? pt unaware  ? Hypertension   ? Left hip pain 07/10/2015  ? Lesion of breast 12/03/2016  ? benign; resolved  ? Low back pain 05/29/2015  ? Measles as a child  ? Mumps as a child  ? Osteopenia 01/03/2017  ? Overweight (BMI 25.0-29.9) 10/07/2008  ? Qualifier: Diagnosis of  By: MMadilyn FiremanMD, CBarnetta Chapel   ? Palpitations   ? several years ago, not currently  ? Pre-diabetes   ? Psoriasis   ? ears  ?  RUQ pain 05/29/2015  ? Spider veins   ? Bilateral legs  ? Spinal stenosis   ? Tailor's bunionette, left 09/02/2017  ? Umbilical hernia   ? Urinary frequency 09/28/2011  ? Vertigo   ? Wears dentures   ? Upper,  ? ?Past Surgical History:  ?Procedure Laterality Date  ? ABDOMINAL SURGERY  1970's  ? BREAST BIOPSY Right 1980  ? BREAST EXCISIONAL BIOPSY  ? BREAST BIOPSY Left 1970  ? BREAST EXCISIONAL BIOPSY  ? BREAST CYST ASPIRATION    ? CATARACT EXTRACTION, BILATERAL    ? COLONOSCOPY    ? CYSTECTOMY    ? abdomen  ? CYSTOSCOPY W/ RETROGRADES Bilateral 04/27/2019  ? Procedure: CYSTOSCOPY WITH RETROGRADE PYELOGRAM;  Surgeon: Cleon Gustin, MD;  Location: [facility];  Service: Urology;  Laterality: Bilateral;  ? EYE SURGERY Bilateral   ? cataract  ?  INSERTION OF MESH N/A 06/11/2017  ? Procedure: INSERTION OF MESH;  Surgeon: Rolm Bookbinder, MD;  Location: [place];  Service: General;  Laterality: N/A;  BILATERAL TAP BLOCK  ? IR REMOVAL TUN ACCESS W/ PORT W/O FL MOD SED  11/09/2020  ? TRANSURETHRAL RESECTION OF BLADDER TUMOR N/A 04/27/2019  ? Procedure: TRANSURETHRAL RESECTION OF BLADDER TUMOR (TURBT);  Surgeon: Cleon Gustin, MD;  Location[facility];  Service: Urology;  Laterality: N/A;  1 HR  ? TRANSURETHRAL RESECTION OF BLADDER TUMOR N/A 06/01/2019  ? Procedure: TRANSURETHRAL RESECTION OF BLADDER TUMOR (TURBT);  Surgeon: Cleon Gustin, MD;  Location: [facility];  Service: Urology;  Laterality: N/A;  1 HR  ? TRANSURETHRAL RESECTION OF BLADDER TUMOR N/A 10/22/2019  ? Procedure: TRANSURETHRAL RESECTION OF BLADDER TUMOR (TURBT);  Surgeon: Cleon Gustin, MD;  Location: [facility];  Service: Urology;  Laterality: N/A;  ? TUBAL LIGATION  1970  ? VENTRAL HERNIA REPAIR N/A 06/11/2017  ? Procedure: LAPAROSCOPIC VENTRAL HERNIA REPAIR WITH MESH ERAS PATHWAY;  Surgeon: Rolm Bookbinder, MD;  Location: [place];  Service: General;  Laterality: N/A;  BILATERAL TAP BLOCK  ? ?Family History  ?Problem Relation Age of Onset  ? Hypertension Mother   ? Anxiety disorder Mother   ? Ovarian cancer Mother 72  ?     lung- smoker, ovarian  ? Lung cancer Mother   ?     smoker  ? Arthritis Mother   ? Hyperlipidemia Mother   ? Alcohol abuse Father   ? Diabetes Sister   ? Hypertension Sister   ? Ovarian cancer Sister   ? Arthritis Sister   ? Depression Sister   ? Hyperlipidemia Sister   ? Other Maternal Grandmother   ?     pacemaker  ? Multiple sclerosis Maternal Grandfather   ?     ?  ? Arthritis Brother   ? COPD Brother   ? Heart attack Brother   ? Hyperlipidemia Brother   ? Stroke Brother   ? Asthma Sister   ? Depression Sister   ? Hyperlipidemia Sister   ? Breast cancer Sister   ? Asthma Sister   ? Depression Sister   ?  Asthma Paternal Uncle   ? ?Social History  ? ?Socioeconomic History  ? Marital status: Widowed  ?  Spouse name: Not on file  ? Number of children: 2  ? Years of education: Not on file  ? Highest education level: Not on file  ?Occupational History  ? Occupation: retired  ?Tobacco Use  ? Smoking status: Former  ?  Packs/day: 1.00  ?  Years: 30.00  ?  Pack years: 30.00  ?  Types: Cigarettes  ?  Quit date: 08/27/1990  ?  Years since quitting: 31.3  ? Smokeless tobacco: Never  ?Vaping Use  ? Vaping Use: Never used  ?Substance and Sexual Activity  ? Alcohol use: No  ? Drug use: No  ? Sexual activity: Not Currently  ?  Birth control/protection: Post-menopausal  ?  Comment: lives alone, no dietary restrictions  ?Other Topics Concern  ? Not on file  ?Social History Narrative  ? Retired. Lives alone.   ? Attended some business college.   ? Former smoker.   ? Smoke alarm in the home. Wears seat balt.   ? Wears dentures.   ? Feels safe in her relationships.   ?   ? ?Social Determinants of Health  ? ?Financial Resource Strain: Low Risk   ? Difficulty of Paying Living Expenses: Not hard at all  ?Food Insecurity: No Food Insecurity  ? Worried About Charity fundraiser in the Last Year: Never true  ? Ran Out of Food in the Last Year: Never true  ?Transportation Needs: No Transportation Needs  ? Lack of Transportation (Medical): No  ? Lack of Transportation (Non-Medical): No  ?Physical Activity: Insufficiently Active  ? Days of Exercise per Week: 2 days  ? Minutes of Exercise per Session: 50 min  ?Stress: No Stress Concern Present  ? Feeling of Stress : Not at all  ?Social Connections: Moderately Integrated  ? Frequency of Communication with Friends and Family: More than three times a week  ? Frequency of Social Gatherings with Friends and Family: More than three times a week  ? Attends Religious Services: More than 4 times per year  ? Active Member of Clubs or Organizations: Yes  ? Attends Archivist Meetings: 1 to 4 times  per year  ? Marital Status: Widowed  ? ? ?Tobacco Counseling ?Counseling given: Not Answered ? ? ?Clinical Intake: ? ?Pre-visit preparation completed: Yes ? ?Pain : 0-10 ?Pain Score: 7  ?Pain Type: Chronic pain ?Pa

## 2022-01-08 ENCOUNTER — Encounter: Payer: Self-pay | Admitting: Family Medicine

## 2022-01-08 ENCOUNTER — Ambulatory Visit: Payer: Medicare PPO | Admitting: Family Medicine

## 2022-01-08 VITALS — BP 126/76 | HR 70 | Temp 98.2°F | Wt 171.0 lb

## 2022-01-08 DIAGNOSIS — R600 Localized edema: Secondary | ICD-10-CM | POA: Diagnosis not present

## 2022-01-08 NOTE — Progress Notes (Signed)
? ? ? ? ? ?Christina [name] , [birth year], 81 y.o., female ?MRN: [id number] ?Patient Care Team  ?  Relationship Specialty Notifications Start End  ?Ma Hillock, DO PCP - General Family Medicine  09/02/17   ?Rolm Bookbinder, MD Consulting Physician General Surgery  10/05/16   ?Monna Fam, MD Consulting Physician Ophthalmology  10/05/16   ?Garry Heater, DDS Consulting Physician Dentistry  10/05/16   ?Juanita Craver, MD Consulting Physician Gastroenterology  09/02/17   ?Volanda Napoleon, MD Medical Oncologist Oncology  05/14/19   ?McKenzie, Candee Furbish, MD Consulting Physician Urology  01/12/21   ?Suella Broad, MD Consulting Physician Physical Medicine and Rehabilitation  01/12/21   ?Lyndal Pulley, DO Consulting Physician Sports Medicine  01/12/21   ? ? ?Chief Complaint  ?Patient presents with  ? Leg Swelling  ?  Going on 3 weeks   ? ?  ?Subjective: Pt presents for an OV with complaints of swelling in her extremities that started about 2-3 weeks ago.  She reports she has been sitting on her small stool out in her yard pulling weeds.  She denies any shortness of breath or chest pain.  She denies any change in her diet that could reflect a higher sodium diet.  However she does admit she eats out frequently.  She denies any dizziness.  She reports her fingers feel tight in the morning over the last week as well.  But she believes that is from pulling weeds and they feel better when she starts moving them. ? ? ?  01/08/2022  ?  3:05 PM 01/03/2022  ?  1:38 PM 10/24/2021  ? 10:54 AM 04/25/2021  ? 11:37 AM 12/28/2020  ?  1:40 PM  ?Depression screen PHQ 2/9  ?Decreased Interest 0 0 1 1 0  ?Down, Depressed, Hopeless 1 0 0 0 0  ?PHQ - 2 Score 1 0 1 1 0  ?Altered sleeping   3 1   ?Tired, decreased energy   2 1   ?Change in appetite   2 0   ?Feeling bad or failure about yourself    0 0   ?Trouble concentrating   1 0   ?Moving slowly or fidgety/restless   0 0   ?Suicidal thoughts   0 0   ?PHQ-9 Score   9 3   ? ? ?Allergies  ?Allergen  Reactions  ? Nutrasweet Aspartame [Aspartame] Diarrhea  ? Lisinopril Cough  ? ?Social History  ? ?Social History Narrative  ? Retired. Lives alone.   ? Attended some business college.   ? Former smoker.   ? Smoke alarm in the home. Wears seat balt.   ? Wears dentures.   ? Feels safe in her relationships.   ?   ? ?Past Medical History:  ?Diagnosis Date  ? Arthritis   ? Asthma   ? a little?, no problems in several years  ? Bladder cancer ([place])   ? Bladder tumor   ? Cervical cancer screening 01/31/2012  ? Chicken pox as a child  ? Degenerative tear of acetabular labrum of left hip 02/26/2019  ? Dehydration 01/19/2020  ? Depression with anxiety 06/07/2009  ? Qualifier: Diagnosis of  By: [name] MD, [name]    ? Dermatitis of external ear 07/11/2012  ? Diverticulosis   ? Essential hypertension, benign 10/13/2007  ? Qualifier: Diagnosis of  By: [name] MD, [name]    ? Ganglion cyst 02/26/2019  ? Right elbow  ? Hiatal hernia with gastroesophageal reflux 10/26/2010  ?  Qualifier: Diagnosis of  By: [name] MD, [name]    ? Hyperglycemia 06/21/2013  ? Hyperlipidemia, mixed 10/16/2015  ? pt unaware  ? Hypertension   ? Left hip pain 07/10/2015  ? Lesion of breast 12/03/2016  ? benign; resolved  ? Low back pain 05/29/2015  ? Measles as a child  ? Mumps as a child  ? Osteopenia 01/03/2017  ? Overweight (BMI 25.0-29.9) 10/07/2008  ? Qualifier: Diagnosis of  By: [name] MD, [name]    ? Palpitations   ? several years ago, not currently  ? Pre-diabetes   ? Psoriasis   ? ears  ? RUQ pain 05/29/2015  ? Spider veins   ? Bilateral legs  ? Spinal stenosis   ? Tailor's bunionette, left 09/02/2017  ? Umbilical hernia   ? Urinary frequency 09/28/2011  ? Vertigo   ? Wears dentures   ? Upper,  ? ?Past Surgical History:  ?Procedure Laterality Date  ? ABDOMINAL SURGERY  1970's  ? BREAST BIOPSY Right 1980  ? BREAST EXCISIONAL BIOPSY  ? BREAST BIOPSY Left 1970  ? BREAST EXCISIONAL BIOPSY  ? BREAST CYST ASPIRATION    ? CATARACT EXTRACTION, BILATERAL     ? COLONOSCOPY    ? CYSTECTOMY    ? abdomen  ? CYSTOSCOPY W/ RETROGRADES Bilateral 04/27/2019  ? Procedure: CYSTOSCOPY WITH RETROGRADE PYELOGRAM;  Surgeon: Cleon Gustin, MD;  Location: [facility];  Service: Urology;  Laterality: Bilateral;  ? EYE SURGERY Bilateral   ? cataract  ? INSERTION OF MESH N/A 06/11/2017  ? Procedure: INSERTION OF MESH;  Surgeon: Rolm Bookbinder, MD;  Location: [place];  Service: General;  Laterality: N/A;  BILATERAL TAP BLOCK  ? IR REMOVAL TUN ACCESS W/ PORT W/O FL MOD SED  11/09/2020  ? TRANSURETHRAL RESECTION OF BLADDER TUMOR N/A 04/27/2019  ? Procedure: TRANSURETHRAL RESECTION OF BLADDER TUMOR (TURBT);  Surgeon: Cleon Gustin, MD;  Location: [facility];  Service: Urology;  Laterality: N/A;  1 HR  ? TRANSURETHRAL RESECTION OF BLADDER TUMOR N/A 06/01/2019  ? Procedure: TRANSURETHRAL RESECTION OF BLADDER TUMOR (TURBT);  Surgeon: Cleon Gustin, MD;  Location: [facility];  Service: Urology;  Laterality: N/A;  1 HR  ? TRANSURETHRAL RESECTION OF BLADDER TUMOR N/A 10/22/2019  ? Procedure: TRANSURETHRAL RESECTION OF BLADDER TUMOR (TURBT);  Surgeon: Cleon Gustin, MD;  Location: [facility];  Service: Urology;  Laterality: N/A;  ? TUBAL LIGATION  1970  ? VENTRAL HERNIA REPAIR N/A 06/11/2017  ? Procedure: LAPAROSCOPIC VENTRAL HERNIA REPAIR WITH MESH ERAS PATHWAY;  Surgeon: Rolm Bookbinder, MD;  Location: [place];  Service: General;  Laterality: N/A;  BILATERAL TAP BLOCK  ? ?Family History  ?Problem Relation Age of Onset  ? Hypertension Mother   ? Anxiety disorder Mother   ? Ovarian cancer Mother 92  ?     lung- smoker, ovarian  ? Lung cancer Mother   ?     smoker  ? Arthritis Mother   ? Hyperlipidemia Mother   ? Alcohol abuse Father   ? Diabetes Sister   ? Hypertension Sister   ? Ovarian cancer Sister   ? Arthritis Sister   ? Depression Sister   ? Hyperlipidemia Sister   ? Other Maternal Grandmother   ?      pacemaker  ? Multiple sclerosis Maternal Grandfather   ?     ?  ? Arthritis Brother   ? COPD Brother   ? Heart attack Brother   ?  Hyperlipidemia Brother   ? Stroke Brother   ? Asthma Sister   ? Depression Sister   ? Hyperlipidemia Sister   ? Breast cancer Sister   ? Asthma Sister   ? Depression Sister   ? Asthma Paternal Uncle   ? ?Allergies as of 01/08/2022   ? ?   Reactions  ? Nutrasweet Aspartame [aspartame] Diarrhea  ? Lisinopril Cough  ? ?  ? ?  ?Medication List  ?  ? ?  ? Accurate as of Jan 08, 2022  3:16 PM. If you have any questions, ask your nurse or doctor.  ?  ?  ? ?  ? ?KLOR-CON PO ?Take 40 mEq by mouth daily. ?  ?LORazepam 0.5 MG tablet ?Commonly known as: ATIVAN ?Take 1 tablet (0.5 mg total) by mouth 2 (two) times daily as needed for anxiety. ?  ?magnesium oxide 400 MG tablet ?Commonly known as: MAG-OX ?Take by mouth daily. ?  ?PARoxetine 20 MG tablet ?Commonly known as: PAXIL ?Take 1 tablet (20 mg total) by mouth daily. ?  ?rivaroxaban 10 MG Tabs tablet ?Commonly known as: XARELTO ?Take 1 tablet (10 mg total) by mouth daily. ?  ?VITAMIN D PO ?Take by mouth. ?  ? ?  ? ? ?All past medical history, surgical history, allergies, family history, immunizations andmedications were updated in the EMR today and reviewed under the history and medication portions of their EMR.    ? ?ROS ?Negative, with the exception of above mentioned in HPI ? ? ?Objective:  ?BP 126/76   Pulse 70   Temp 98.2 ?F (36.8 ?C)   Wt 171 lb (77.6 kg)   SpO2 97%   BMI 28.46 kg/m?  ?Body mass index is 28.46 kg/m?[name] ?Physical Exam ?Vitals and nursing note reviewed.  ?Constitutional:   ?   General: She is not in acute distress. ?   Appearance: Normal appearance. She is normal weight. She is not ill-appearing or toxic-appearing.  ?HENT:  ?   Head: Normocephalic and atraumatic.  ?Eyes:  ?   Extraocular Movements: Extraocular movements intact.  ?   Conjunctiva/sclera: Conjunctivae normal.  ?   Pupils: Pupils are equal, round, and reactive to  light.  ?Cardiovascular:  ?   Rate and Rhythm: Normal rate and regular rhythm.  ?   Heart sounds: No murmur heard. ?Pulmonary:  ?   Effort: Pulmonary effort is normal. No respiratory distress.  ?   Breath sounds: N

## 2022-01-08 NOTE — Patient Instructions (Signed)
Make sure to decrease salt intake int he diet.  ?Your blood pressure looks great.  ?You do not have much fluid in your feet. Lowering salt and keep the feet elevated when possible.  ? ? ? ?

## 2022-01-29 DIAGNOSIS — L814 Other melanin hyperpigmentation: Secondary | ICD-10-CM | POA: Diagnosis not present

## 2022-01-29 DIAGNOSIS — D692 Other nonthrombocytopenic purpura: Secondary | ICD-10-CM | POA: Diagnosis not present

## 2022-01-29 DIAGNOSIS — X32XXXS Exposure to sunlight, sequela: Secondary | ICD-10-CM | POA: Diagnosis not present

## 2022-01-29 DIAGNOSIS — Z85828 Personal history of other malignant neoplasm of skin: Secondary | ICD-10-CM | POA: Diagnosis not present

## 2022-01-29 DIAGNOSIS — D1801 Hemangioma of skin and subcutaneous tissue: Secondary | ICD-10-CM | POA: Diagnosis not present

## 2022-02-16 DIAGNOSIS — M25552 Pain in left hip: Secondary | ICD-10-CM | POA: Diagnosis not present

## 2022-02-16 DIAGNOSIS — Z96642 Presence of left artificial hip joint: Secondary | ICD-10-CM | POA: Diagnosis not present

## 2022-02-21 ENCOUNTER — Telehealth: Payer: Self-pay

## 2022-02-21 NOTE — Telephone Encounter (Signed)
LVM for pt CB to update HM. Note Pt is due for Cologuard test.

## 2022-02-23 NOTE — Telephone Encounter (Signed)
Spoke with pt re: cologuard test. Pt declined colon cancer screening at this time.

## 2022-03-06 ENCOUNTER — Inpatient Hospital Stay: Payer: Medicare PPO

## 2022-03-06 ENCOUNTER — Other Ambulatory Visit: Payer: Self-pay | Admitting: Hematology & Oncology

## 2022-03-06 ENCOUNTER — Inpatient Hospital Stay: Payer: Medicare PPO | Attending: Hematology & Oncology | Admitting: Hematology & Oncology

## 2022-03-06 ENCOUNTER — Other Ambulatory Visit: Payer: Medicare PPO

## 2022-03-06 VITALS — BP 141/65 | HR 73 | Temp 97.8°F | Wt 173.8 lb

## 2022-03-06 DIAGNOSIS — Z8551 Personal history of malignant neoplasm of bladder: Secondary | ICD-10-CM

## 2022-03-06 DIAGNOSIS — Z79899 Other long term (current) drug therapy: Secondary | ICD-10-CM | POA: Diagnosis not present

## 2022-03-06 DIAGNOSIS — C679 Malignant neoplasm of bladder, unspecified: Secondary | ICD-10-CM | POA: Insufficient documentation

## 2022-03-06 DIAGNOSIS — Z936 Other artificial openings of urinary tract status: Secondary | ICD-10-CM | POA: Insufficient documentation

## 2022-03-06 DIAGNOSIS — M25422 Effusion, left elbow: Secondary | ICD-10-CM | POA: Diagnosis not present

## 2022-03-06 DIAGNOSIS — M545 Low back pain, unspecified: Secondary | ICD-10-CM | POA: Insufficient documentation

## 2022-03-06 DIAGNOSIS — Z7901 Long term (current) use of anticoagulants: Secondary | ICD-10-CM | POA: Diagnosis not present

## 2022-03-06 DIAGNOSIS — M5416 Radiculopathy, lumbar region: Secondary | ICD-10-CM | POA: Diagnosis not present

## 2022-03-06 LAB — CMP (CANCER CENTER ONLY)
ALT: 7 U/L (ref 0–44)
AST: 15 U/L (ref 15–41)
Albumin: 4.3 g/dL (ref 3.5–5.0)
Alkaline Phosphatase: 68 U/L (ref 38–126)
Anion gap: 6 (ref 5–15)
BUN: 17 mg/dL (ref 8–23)
CO2: 27 mmol/L (ref 22–32)
Calcium: 10.3 mg/dL (ref 8.9–10.3)
Chloride: 105 mmol/L (ref 98–111)
Creatinine: 1.16 mg/dL — ABNORMAL HIGH (ref 0.44–1.00)
GFR, Estimated: 48 mL/min — ABNORMAL LOW (ref >60)
Glucose, Bld: 128 mg/dL — ABNORMAL HIGH (ref 70–99)
Potassium: 4.6 mmol/L (ref 3.5–5.1)
Sodium: 138 mmol/L (ref 135–145)
Total Bilirubin: 1 mg/dL (ref 0.3–1.2)
Total Protein: 6.3 g/dL — ABNORMAL LOW (ref 6.5–8.1)

## 2022-03-06 LAB — CBC WITH DIFFERENTIAL (CANCER CENTER ONLY)
Abs Immature Granulocytes: 0.03 10*3/uL (ref 0.00–0.07)
Basophils Absolute: 0 10*3/uL (ref 0.0–0.1)
Basophils Relative: 1 %
Eosinophils Absolute: 0.1 10*3/uL (ref 0.0–0.5)
Eosinophils Relative: 2 %
HCT: 39.4 % (ref 36.0–46.0)
Hemoglobin: 12.7 g/dL (ref 12.0–15.0)
Immature Granulocytes: 1 %
Lymphocytes Relative: 46 %
Lymphs Abs: 2.5 10*3/uL (ref 0.7–4.0)
MCH: 31.8 pg (ref 26.0–34.0)
MCHC: 32.2 g/dL (ref 30.0–36.0)
MCV: 98.7 fL (ref 80.0–100.0)
Monocytes Absolute: 0.5 10*3/uL (ref 0.1–1.0)
Monocytes Relative: 10 %
Neutro Abs: 2.1 10*3/uL (ref 1.7–7.7)
Neutrophils Relative %: 40 %
Platelet Count: 199 10*3/uL (ref 150–400)
RBC: 3.99 MIL/uL (ref 3.87–5.11)
RDW: 13.3 % (ref 11.5–15.5)
WBC Count: 5.2 10*3/uL (ref 4.0–10.5)
nRBC: 0 % (ref 0.0–0.2)

## 2022-03-06 LAB — LACTATE DEHYDROGENASE: LDH: 142 U/L (ref 98–192)

## 2022-03-06 NOTE — Progress Notes (Signed)
Hematology and Oncology Follow Up Visit  Christina [name] [id number] 1941-07-22 81 y.o. 03/06/2022   Principle Diagnosis:  Stage IIIB (T1N3M0) invasive urothelial carcinoma of the bladder  Subsegmental blood clot in right lower lung  Current Therapy:   Status post neoadjuvant chemotherapy with ddMVAC Radical cystectomy on 03/03/2020  Xarelto 20 mg p.o. daily-6 months of therapy to start on 06/30/2021 -  changed to Xarelto 10 mg po q day on 12/26/2021     Interim History:  Christina [name] is back for an unscheduled visit.  Apparently, she been having a lot of pain over on the left side and lower back.  She had left hip surgery back in September last year.  She still have difficulties with this.  She saw her orthopedist.  He felt that we needed to see her to make sure that there is nothing related to malignancy.  She had a CT scan done back in May.  Everything was fine with the CT scan with no evidence of a malignancy.  No she is on low-dose Xarelto right now.  She had a past history of an embolus in the lung.  I just saw her walk.  She really is having a tough time.  The pain is in the left sacroiliac area.  She is having no problems with her bowels.  She has a urostomy bag that is working nicely.  There is no cough or shortness of breath.  She has had no leg weakness.  The other problems that she has some swelling about the left elbow.  She has a subcutaneous nodule in the left elbow area.  This almost feels like a lipoma.  It is somewhat painful however.  Currently, I would say performance status is probably ECOG 1.     Medications:  Current Outpatient Medications:    LORazepam (ATIVAN) 0.5 MG tablet, Take 1 tablet (0.5 mg total) by mouth 2 (two) times daily as needed for anxiety., Disp: 60 tablet, Rfl: 5   magnesium oxide (MAG-OX) 400 MG tablet, Take by mouth daily., Disp: , Rfl:    PARoxetine (PAXIL) 20 MG tablet, Take 1 tablet (20 mg total) by mouth daily., Disp: 90 tablet,  Rfl: 1   Potassium Chloride (KLOR-CON PO), Take 40 mEq by mouth daily., Disp: , Rfl:    rivaroxaban (XARELTO) 10 MG TABS tablet, Take 1 tablet (10 mg total) by mouth daily., Disp: 90 tablet, Rfl: 3   VITAMIN D PO, Take by mouth., Disp: , Rfl:   Allergies:  Allergies  Allergen Reactions   Nutrasweet Aspartame [Aspartame] Diarrhea   Lisinopril Cough    Past Medical History, Surgical history, Social history, and Family History were reviewed and updated.  Review of Systems: Review of Systems  Constitutional: Negative.   HENT:  Negative.    Eyes: Negative.   Respiratory: Negative.    Cardiovascular: Negative.   Gastrointestinal: Negative.   Endocrine: Negative.   Genitourinary: Negative.    Musculoskeletal: Negative.   Skin: Negative.   Neurological: Negative.   Hematological: Negative.   Psychiatric/Behavioral: Negative.      Physical Exam:  weight is 173 lb 12.8 oz (78.8 kg). Her oral temperature is 97.8 F (36.6 C). Her blood pressure is 141/65 (abnormal) and her pulse is 73. Her oxygen saturation is 99%.   Wt Readings from Last 3 Encounters:  03/06/22 173 lb 12.8 oz (78.8 kg)  01/08/22 171 lb (77.6 kg)  01/03/22 171 lb 12.8 oz (77.9 kg)    Physical Exam Vitals  reviewed.  HENT:     Head: Normocephalic and atraumatic.  Eyes:     Pupils: Pupils are equal, round, and reactive to light.  Cardiovascular:     Rate and Rhythm: Normal rate and regular rhythm.     Heart sounds: Normal heart sounds.  Pulmonary:     Effort: Pulmonary effort is normal.     Breath sounds: Normal breath sounds.  Abdominal:     General: Bowel sounds are normal.     Palpations: Abdomen is soft.     Comments: Abdominal exam shows the healed laparotomy scar in the midline.  She has some firmness in the middle of the laparotomy scar.  She has the urostomy bag which is well-positioned.  Urine is clear.  There is no fluid wave in the abdomen.  Bowel sounds are present.  She has no palpable liver or  spleen tip.  Musculoskeletal:        General: No tenderness or deformity. Normal range of motion.     Cervical back: Normal range of motion.  Lymphadenopathy:     Cervical: No cervical adenopathy.  Skin:    General: Skin is warm and dry.     Findings: No erythema or rash.  Neurological:     Mental Status: She is alert and oriented to person, place, and time.  Psychiatric:        Behavior: Behavior normal.        Thought Content: Thought content normal.        Judgment: Judgment normal.     Lab Results  Component Value Date   WBC 5.2 03/06/2022   HGB 12.7 03/06/2022   HCT 39.4 03/06/2022   MCV 98.7 03/06/2022   PLT 199 03/06/2022     Chemistry      Component Value Date/Time   NA 138 03/06/2022 1449   NA 142 03/04/2017 1016   K 4.6 03/06/2022 1449   CL 105 03/06/2022 1449   CO2 27 03/06/2022 1449   BUN 17 03/06/2022 1449   BUN 13 03/04/2017 1016   CREATININE 1.16 (H) 03/06/2022 1449   CREATININE 0.78 05/20/2015 1704      Component Value Date/Time   CALCIUM 10.3 03/06/2022 1449   ALKPHOS 68 03/06/2022 1449   AST 15 03/06/2022 1449   ALT 7 03/06/2022 1449   BILITOT 1.0 03/06/2022 1449      Impression and Plan: Christina [name] is a 81 year old white female.  She had superficial bladder cancer.  She was treated with intravesicular therapy.  However, she has had had muscle invasive cancer which was poorly differentiated.  She then underwent neoadjuvant chemotherapy followed by radical cystectomy.  She is found to have stage IIIB disease.  I still do not think that we are dealing with cancer with her pain.  However, I think we need to have an MRI to see if there is any issues.  I would have to think that this might be more so arthritis and possibly may be some nerve impingement.  We to get an MRI of the back and probably MRI of the left elbow area.  Again I am not sure what might be going on with the left elbow.  Again this seems like a lipoma but I want to double  check.  We will try to get all these MRI set up for in a week or so.  For right now, we will just keep her appointment as we have previously scheduled.  If there is a problem with  the MRI, then we will get her in sooner.    Volanda Napoleon, MD 7/11/20234:22 PM

## 2022-03-13 ENCOUNTER — Ambulatory Visit: Payer: Medicare PPO | Admitting: Hematology & Oncology

## 2022-03-13 IMAGING — MG MM DIGITAL SCREENING BILAT W/ TOMO AND CAD
8 series · 9 of 24 positions shown · non-contrast
Comparison: Previous exam(s).

CLINICAL DATA: Screening.

EXAM:
DIGITAL SCREENING BILATERAL MAMMOGRAM WITH TOMOSYNTHESIS AND CAD
TECHNIQUE: Bilateral screening digital craniocaudal and mediolateral oblique
mammograms were obtained. Bilateral screening digital breast
tomosynthesis was performed. The images were evaluated with
computer-aided detection.

[L MLO synth-2D]
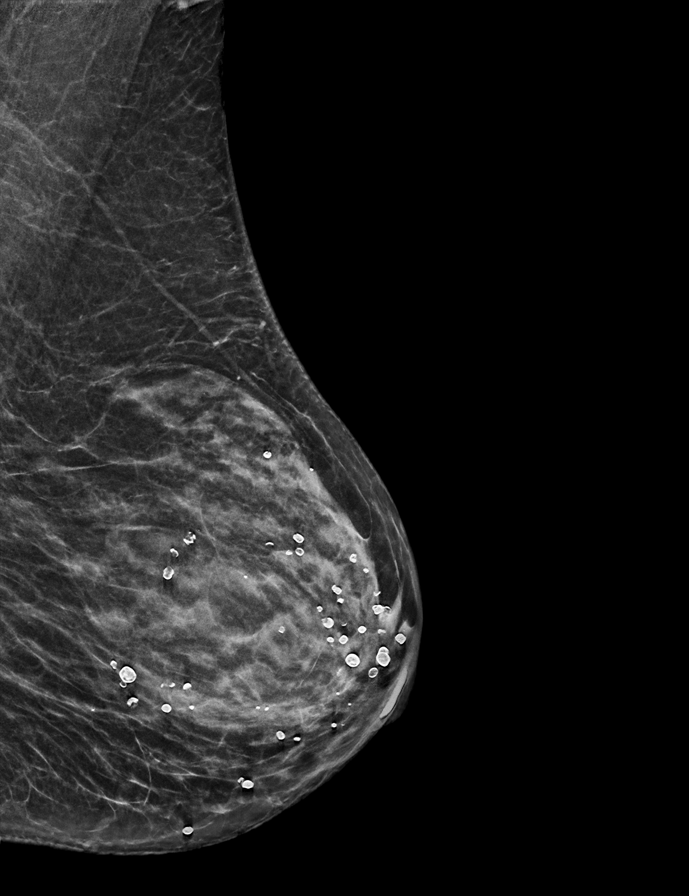

[R CC synth-2D]
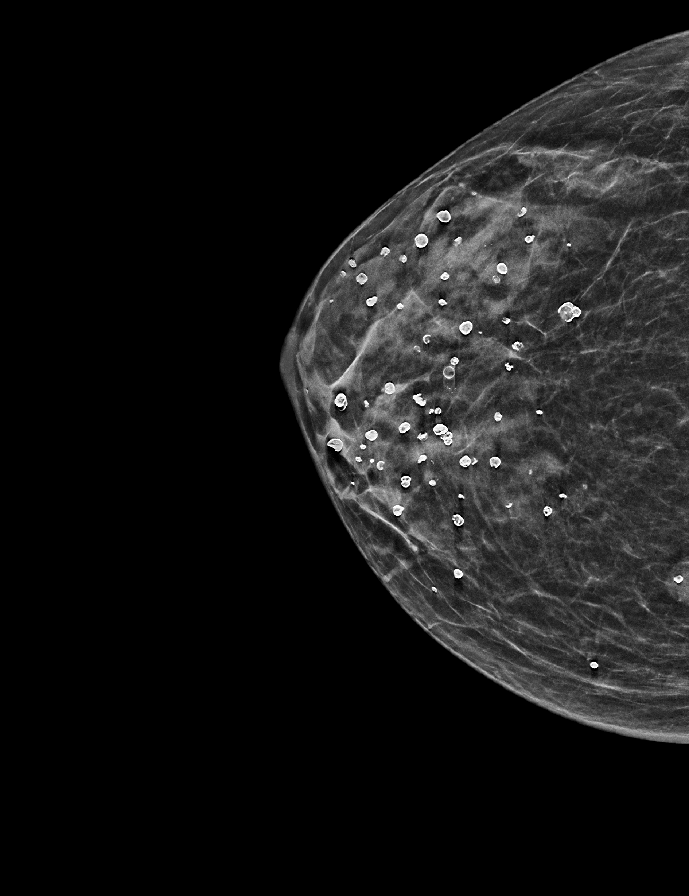

[R MLO synth-2D]
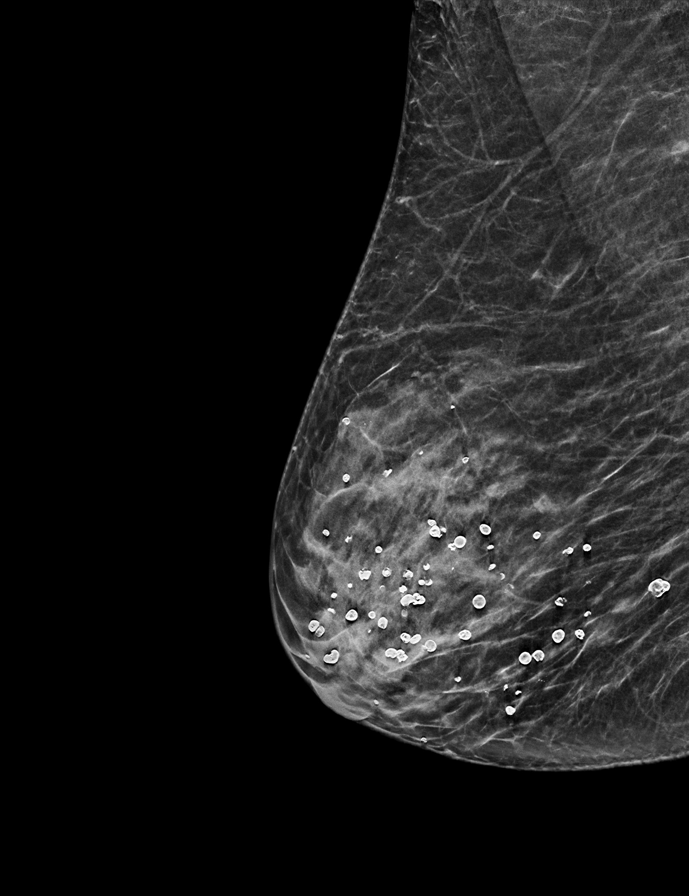

[L CC synth-2D]
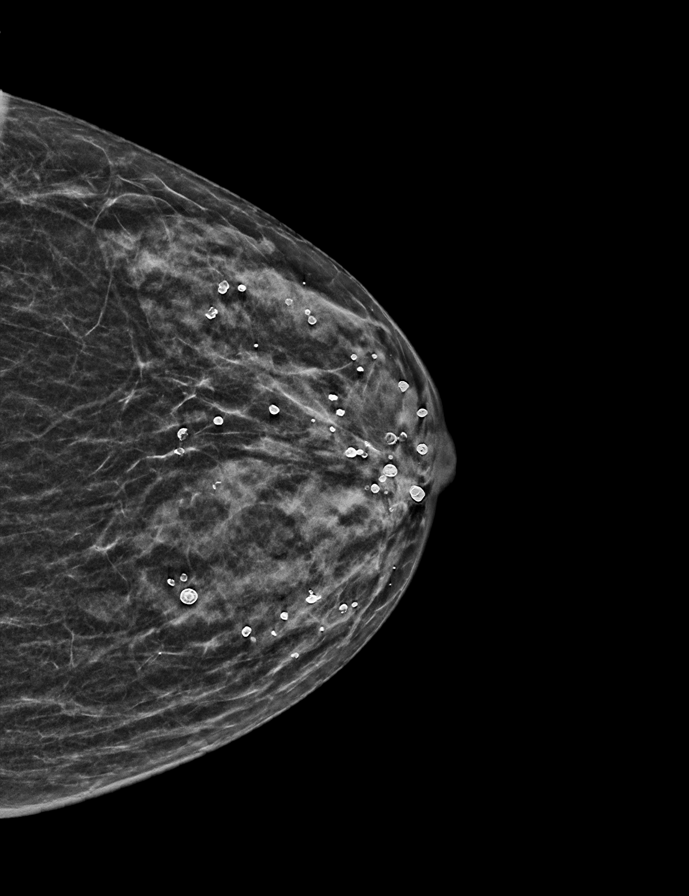

[R CC tomo · 2 of 38 frames shown]
[frame 13/38]
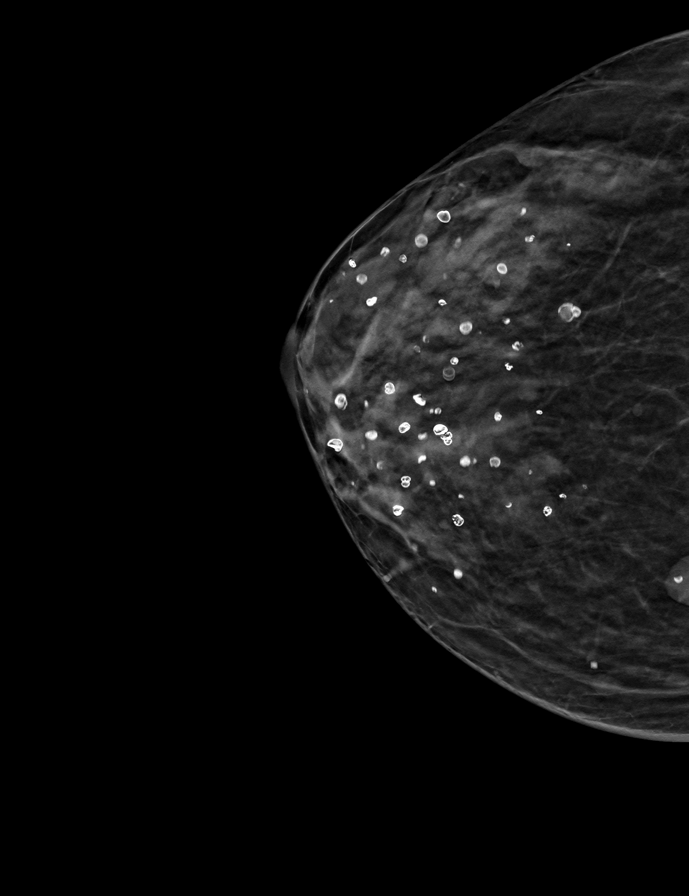
[frame 19/38]
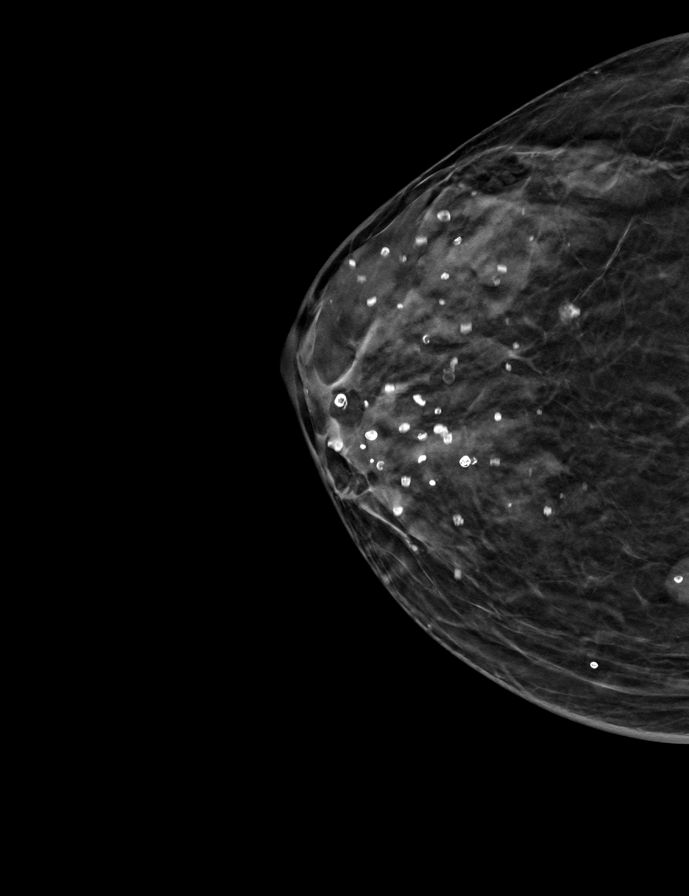

[L MLO tomo · tomo slice 22/43.0]
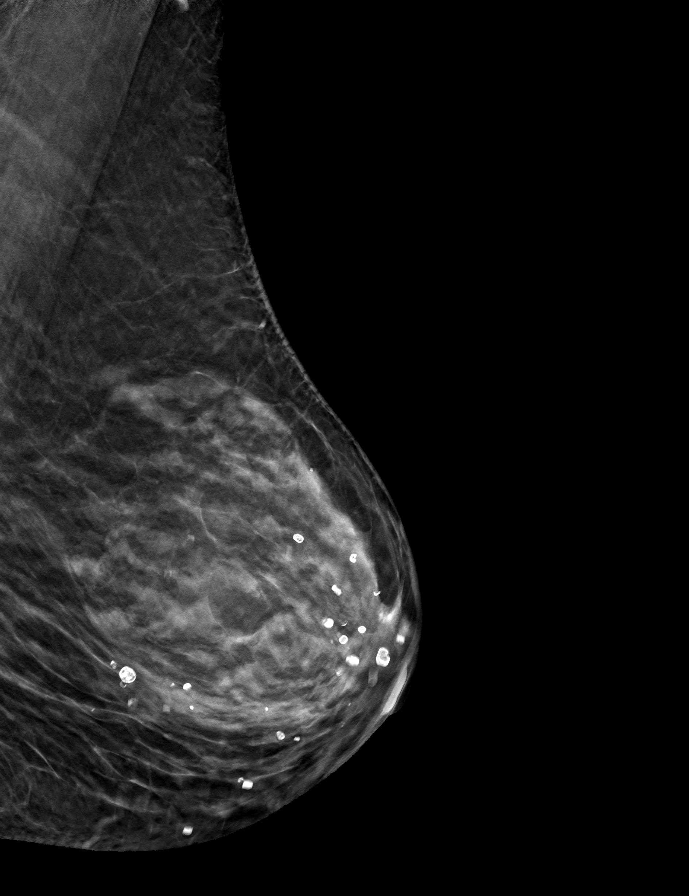

[L CC tomo · tomo slice 19/37.0]
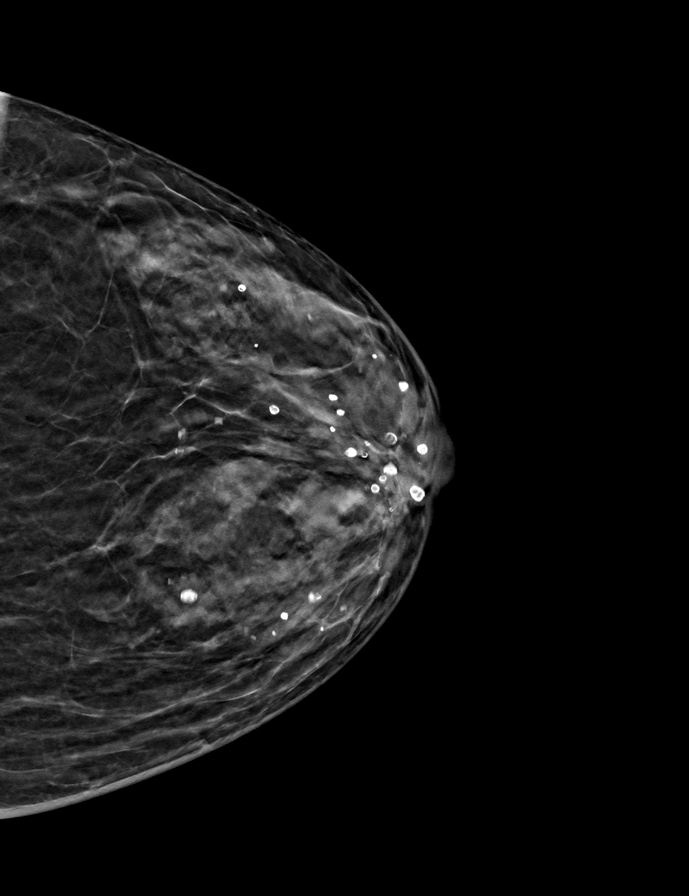

[R MLO tomo · tomo slice 21/40.0]
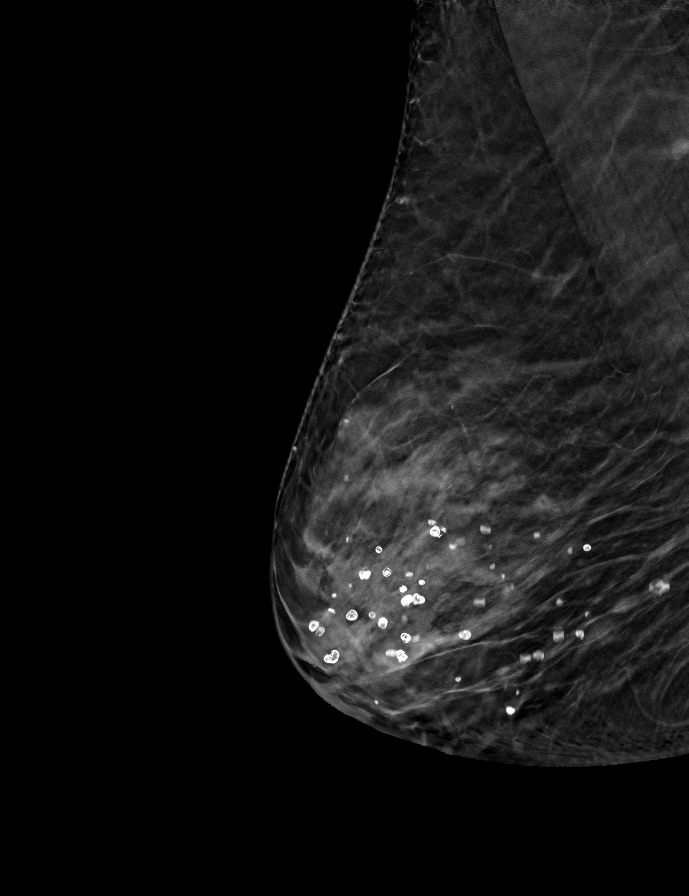

[9 of 24 positions shown; findings below may reference images not displayed]

ACR Breast Density Category c: The breast tissue is heterogeneously
dense, which may obscure small masses.
FINDINGS: There are no findings suspicious for malignancy. The images were
evaluated with computer-aided detection.
IMPRESSION: No mammographic evidence of malignancy. A result letter of this
screening mammogram will be mailed directly to the patient.

RECOMMENDATION:
Screening mammogram in one year. (Code:T4-5-GWO)

BI-RADS CATEGORY  1: Negative.

## 2022-03-21 ENCOUNTER — Ambulatory Visit (HOSPITAL_COMMUNITY)
Admission: RE | Admit: 2022-03-21 | Discharge: 2022-03-21 | Disposition: A | Discharge status: Home / Self Care | Payer: Medicare PPO | Source: Ambulatory Visit | Attending: Hematology & Oncology | Admitting: Hematology & Oncology

## 2022-03-21 DIAGNOSIS — Z8551 Personal history of malignant neoplasm of bladder: Secondary | ICD-10-CM | POA: Insufficient documentation

## 2022-03-21 DIAGNOSIS — M25422 Effusion, left elbow: Secondary | ICD-10-CM | POA: Insufficient documentation

## 2022-03-21 DIAGNOSIS — M7989 Other specified soft tissue disorders: Secondary | ICD-10-CM | POA: Diagnosis not present

## 2022-03-21 DIAGNOSIS — M48061 Spinal stenosis, lumbar region without neurogenic claudication: Secondary | ICD-10-CM | POA: Diagnosis not present

## 2022-03-21 DIAGNOSIS — M5126 Other intervertebral disc displacement, lumbar region: Secondary | ICD-10-CM | POA: Diagnosis not present

## 2022-03-21 DIAGNOSIS — M19022 Primary osteoarthritis, left elbow: Secondary | ICD-10-CM | POA: Diagnosis not present

## 2022-03-21 MED ORDER — GADOBUTROL 1 MMOL/ML IV SOLN
7.5000 mL | Freq: Once | INTRAVENOUS | Status: AC | PRN
  Administered 2022-03-21: 7.5 mL via INTRAVENOUS

## 2022-03-22 ENCOUNTER — Encounter: Payer: Self-pay | Admitting: *Deleted

## 2022-03-28 ENCOUNTER — Encounter: Payer: Self-pay | Admitting: Hematology & Oncology

## 2022-03-28 NOTE — Telephone Encounter (Signed)
Pt called in stating it has been recommended to her that she should see a neurosurgeon for her back sx instead of an orthopedic. Pt was contacted by Dr Laurena Bering office today and told them to hold off on scheduling so she can ask Dr Marin Olp. Dr Marin Olp said that he recommends Dr Lynann Bologna for her spinal sx and if she would like a neurosurgeon to do it then to get a referral from her PCP. Pt stated she would stick with what Dr Marin Olp suggests and will contact Dr Carlos American office to schedule apt.

## 2022-03-30 DIAGNOSIS — M5136 Other intervertebral disc degeneration, lumbar region: Secondary | ICD-10-CM | POA: Diagnosis not present

## 2022-03-30 DIAGNOSIS — M5451 Vertebrogenic low back pain: Secondary | ICD-10-CM | POA: Diagnosis not present

## 2022-04-10 ENCOUNTER — Encounter: Payer: Self-pay | Admitting: Family Medicine

## 2022-04-10 ENCOUNTER — Ambulatory Visit: Payer: Medicare PPO | Admitting: Family Medicine

## 2022-04-10 VITALS — BP 127/68 | HR 77 | Temp 97.5°F | Ht 65.0 in | Wt 173.0 lb

## 2022-04-10 DIAGNOSIS — Z8551 Personal history of malignant neoplasm of bladder: Secondary | ICD-10-CM | POA: Diagnosis not present

## 2022-04-10 DIAGNOSIS — F418 Other specified anxiety disorders: Secondary | ICD-10-CM | POA: Diagnosis not present

## 2022-04-10 IMAGING — CT CT CHEST-ABD-PELV W/ CM
2 of 5 series · 13 of 36 positions shown, 15 images · IV contrast (Omnipaque)
Comparison: Multiple priors including most recent CT chest abdomen
pelvis June 09, 2020

CLINICAL DATA: Restaging invasive bladder cancer, treated
surgically, chemotherapy halted due to side effects.

EXAM:
CT CHEST, ABDOMEN, AND PELVIS WITH CONTRAST
TECHNIQUE: Multidetector CT imaging of the chest, abdomen and pelvis was
performed following the standard protocol during bolus
administration of intravenous contrast.
CONTRAST:  80mL OMNIPAQUE IOHEXOL 300 MG/ML  SOLN

[Series 2: cap with 2 · axial · 0.96mm/px · z∈[-634,-99]mm · 10 of 131 slices shown, 12 images]
[im 12/131  mediastinal]
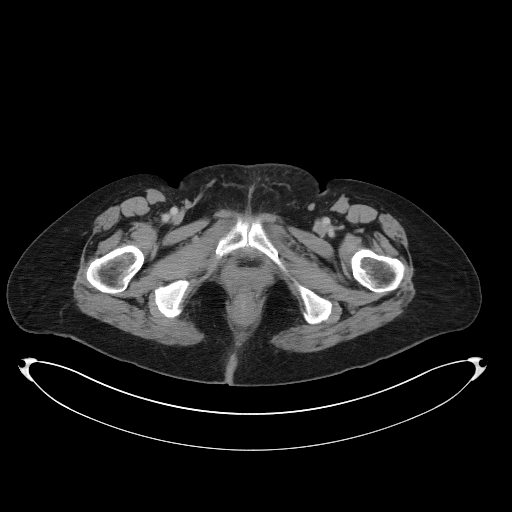
[im 12/131  bone]
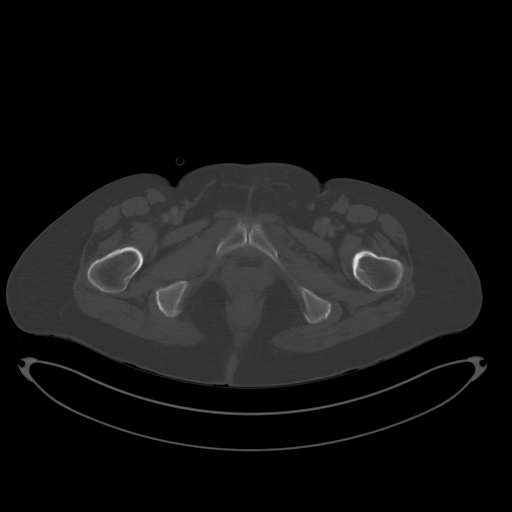
[im 24/131  mediastinal]
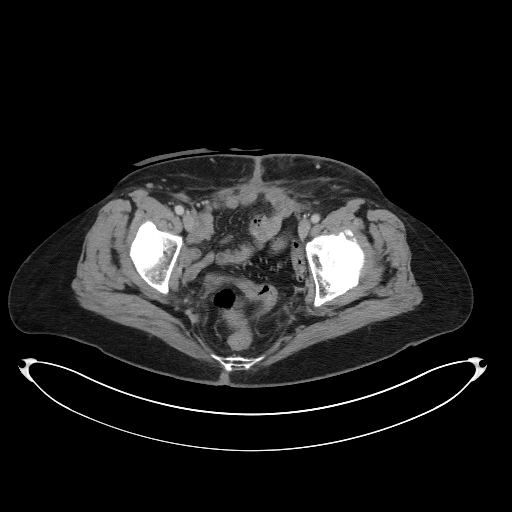
[im 36/131  mediastinal]
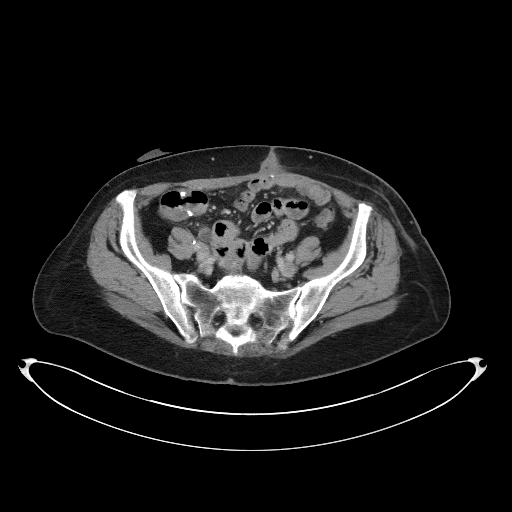
[im 48/131  mediastinal]
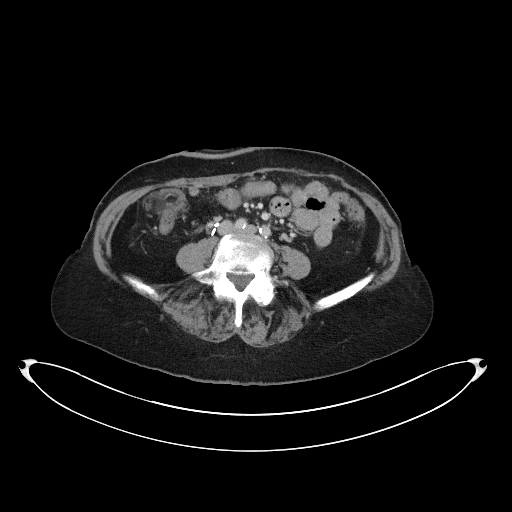
[im 60/131  mediastinal]
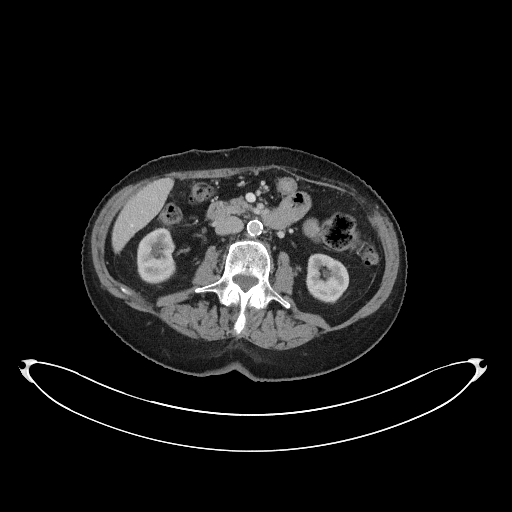
[im 71/131  mediastinal]
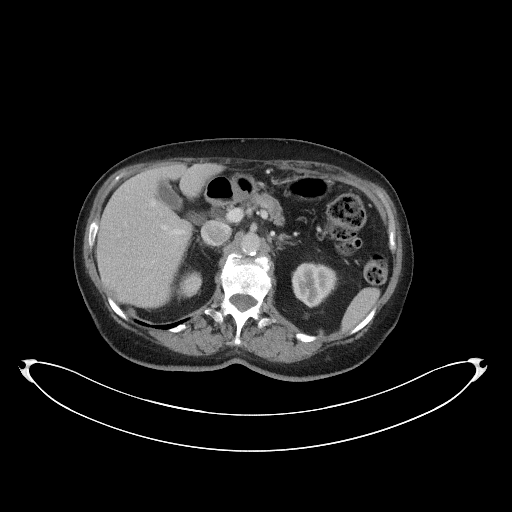
[im 83/131  mediastinal]
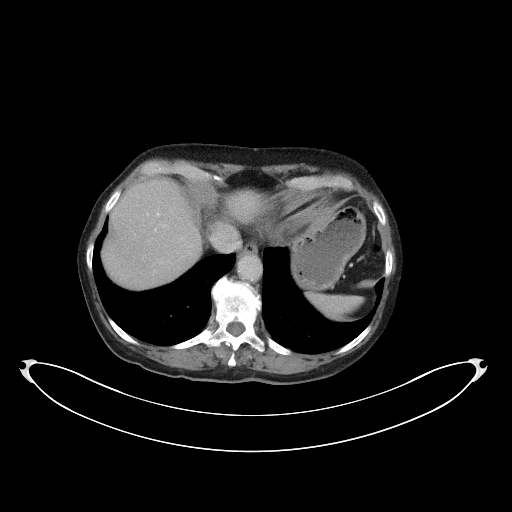
[im 95/131  mediastinal]
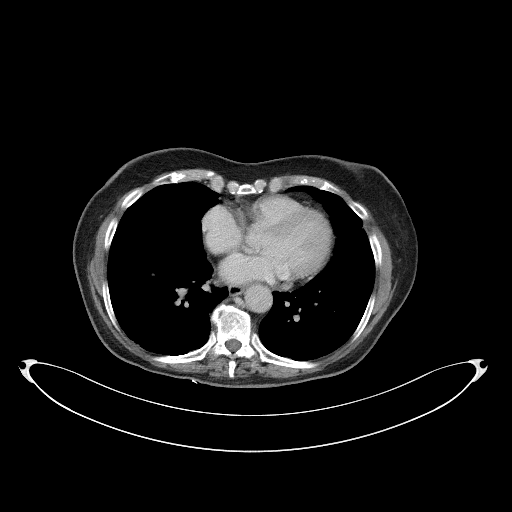
[im 107/131  mediastinal]
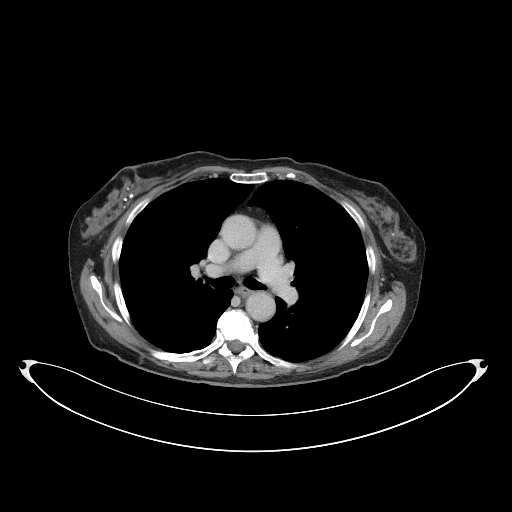
[im 107/131  bone]
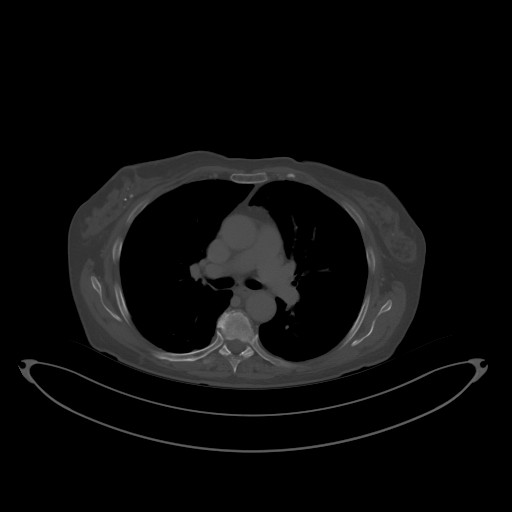
[im 119/131  mediastinal]
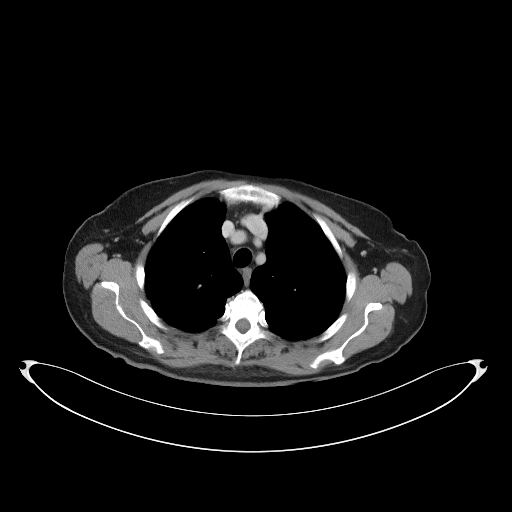

[Series 5: coronals · coronal · 0.78mm/px · 3 of 124 slices shown]
[im 25/124  mediastinal]
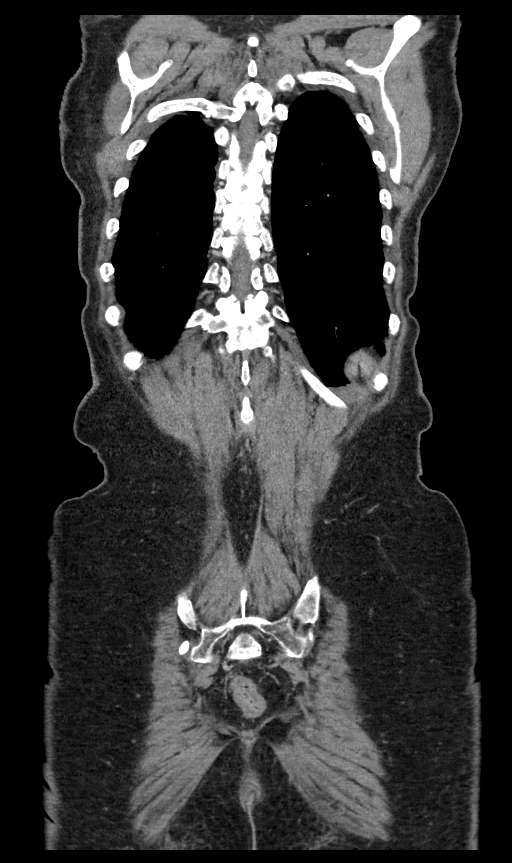
[im 50/124  mediastinal]
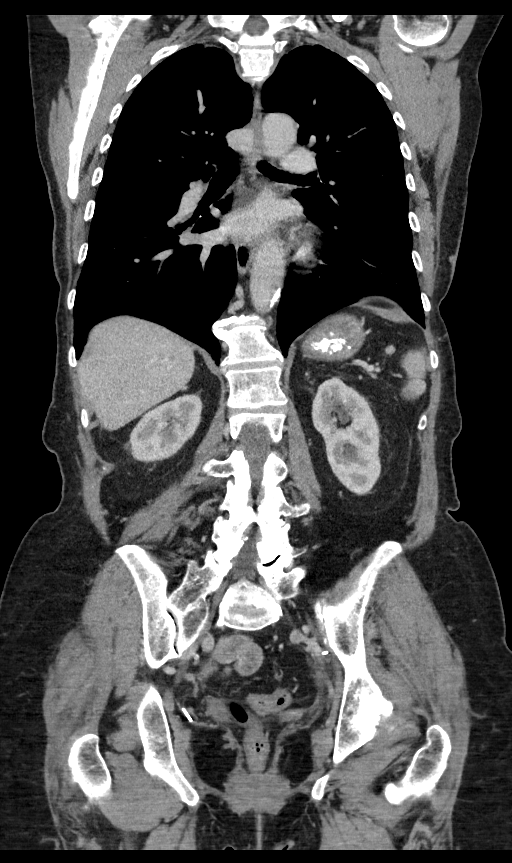
[im 74/124  mediastinal]
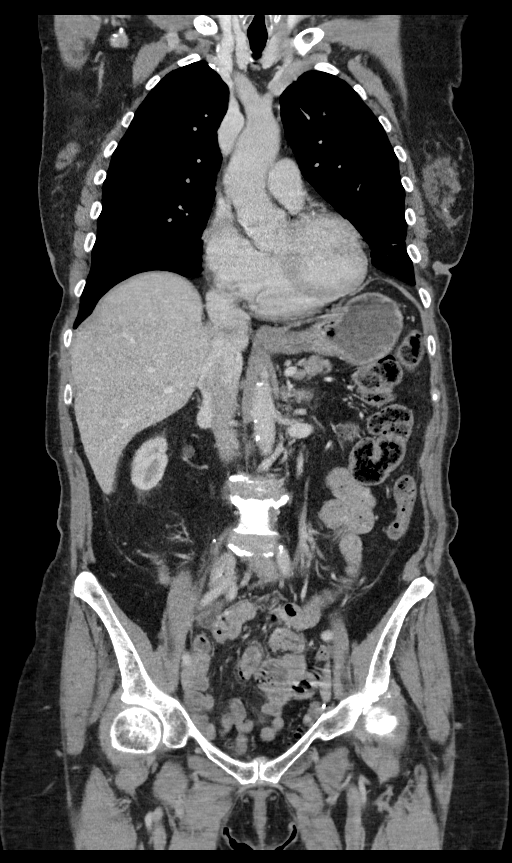

[13 of 36 positions shown; findings below may reference images not displayed]

FINDINGS: CT CHEST FINDINGS

Cardiovascular: Interval removal of the porta catheter. Normal size
heart. No significant pericardial effusion/thickening. Aortic
atherosclerosis. No thoracic aortic aneurysm. No central pulmonary
embolus.

Mediastinum/Nodes: No over thyroid nodules. No pathologically
enlarged mediastinal, hilar or axillary lymph nodes. Trachea
esophagus are grossly unremarkable.

Lungs/Pleura: A few scattered tiny solid pulmonary nodules are again
visualized the largest of which measures 3 mm in left lower lobe on
image 106/4, all of which are stable. No new or enlarging suspicious
pulmonary nodules. No focal consolidation. No pleural effusion. No
pneumothorax.

Musculoskeletal: Thoracic spondylosis. No aggressive lytic or
blastic lesion of bone.

CT ABDOMEN PELVIS FINDINGS

Hepatobiliary: No suspicious hepatic lesion. Gallbladder is
unremarkable. No biliary ductal dilation.

Pancreas: Unremarkable. No pancreatic ductal dilatation or
surrounding inflammatory changes.

Spleen: Normal in size without focal abnormality.

Adrenals/Urinary Tract: Stable 1.5 cm right adrenal adenoma. Left
adrenal glands unremarkable.

Bilateral kidneys are unremarkable without hydronephrosis or solid
enhancing masses.

Postsurgical change of cystectomy with ileal conduit urinary
diversion in the right ventral abdominal wall. Ileo conduit is
collapsed and grossly normal. Ureters appear normal in caliber.

Stomach/Bowel: Stomach is grossly unremarkable. Enterostomy in the
right lower quadrant. No suspicious small bowel dilation. Appendix
not discretely visualized however there is no pericecal
inflammation. Left-sided colonic diverticulosis without findings of
acute diverticulitis. No suspicious colonic wall thickening or mass
like lesions.

Vascular/Lymphatic: Aortic atherosclerosis. No abdominal aortic
aneurysm. No gastrohepatic or hepatoduodenal ligament
lymphadenopathy. No retroperitoneal or mesenteric lymphadenopathy.
No pelvic sidewall lymphadenopathy. No groin lymphadenopathy.

Reproductive: Status post hysterectomy. No adnexal masses.

Other: Postsurgical change in the anterior abdominal wall. Small fat
containing ventral hernia no discrete peritoneal nodularity. No
abdominopelvic ascites.

Musculoskeletal: Multilevel degenerative changes spine. Degenerative
changes bilateral hips and SI joints. No aggressive lytic or blastic
lesion of bone.
IMPRESSION: 1. Postsurgical change of cystectomy with ileal conduit urinary
diversion in the right ventral abdominal wall.
2. Stable examination, without evidence of recurrence or metastatic
disease within the chest, abdomen, or pelvis.
3. Tiny bilateral pulmonary nodules are stable in size and favored
benign however continued attention on follow-up imaging is
warranted.
4. Stable right adrenal adenoma.
5. Left-sided colonic diverticulosis without findings of acute
diverticulitis.
6. Aortic atherosclerosis.

Aortic Atherosclerosis (A1M3C-HO0.0).

## 2022-04-10 MED ORDER — LORAZEPAM 0.5 MG PO TABS: 0.5000 mg | ORAL_TABLET | Freq: Two times a day (BID) | ORAL | 5 refills | Status: DC | PRN

## 2022-04-10 MED ORDER — PAROXETINE HCL 20 MG PO TABS
20.0000 mg | ORAL_TABLET | Freq: Every day | ORAL | 1 refills | Status: DC
Start: 2022-04-10 — End: 2022-10-22

## 2022-04-10 NOTE — Progress Notes (Signed)
Patient ID: Christina [name], female  DOB: [birth year], 81 y.o.   MRN: [id number] Patient Care Team    Relationship Specialty Notifications Start End  Ma Hillock, DO PCP - General Family Medicine  09/02/17   Rolm Bookbinder, MD Consulting Physician General Surgery  10/05/16   Monna Fam, MD Consulting Physician Ophthalmology  10/05/16   Garry Heater, DDS Consulting Physician Dentistry  10/05/16   Juanita Craver, MD Consulting Physician Gastroenterology  09/02/17   Volanda Napoleon, MD Medical Oncologist Oncology  05/14/19   Cleon Gustin, MD Consulting Physician Urology  01/12/21   Suella Broad, MD Consulting Physician Physical Medicine and Rehabilitation  01/12/21   Lyndal Pulley, DO Consulting Physician Sports Medicine  01/12/21     Chief Complaint  Patient presents with   Anxiety    Cmc; pt is not fasting    Subjective: Christina [name] is a 81 y.o. female present for [facility] All past medical history, surgical history, allergies, family history, immunizations, medications and social history were updated in the electronic medical record today. All recent labs, ED visits and hospitalizations within the last year were reviewed.  Situational anxiety:  Patient report she is okay. She has some stressors, but overall feels she is doing well and would like to keep her paxil 20 mg and ativan the same.  She reports some days she gets feeling down but she is able to talk herself back up and it usually does not last more than an hour or so. She rarely uses the ativan- only if she can not sleep. Has filled one 30d script in 6 mos and has some left in bottle.  Prior note: Was started on Paxil by her oncology team at the onset of diagnosis of bladder cancer. She does feel that it has been helpful along with the Ativan 0.5 mg twice daily. She reports she had a provider tell her that Zoloft is better for her than Paxil and she wanted to try zoloft. Switch was made last appt and she feels  paxil was a better fit for her. She does have trouble sleeping some nights. She reports ativan  Mg qhs is usually helpful but sometimes she requires additional coverage.       04/10/2022   11:13 AM 01/08/2022    3:05 PM 01/03/2022    1:38 PM 10/24/2021   10:54 AM 04/25/2021   11:37 AM  Depression screen PHQ 2/9  Decreased Interest 1 0 0 1 1  Down, Depressed, Hopeless 1 1 0 0 0  PHQ - 2 Score 2 1 0 1 1  Altered sleeping '3   3 1  '$ Tired, decreased energy '3   2 1  '$ Change in appetite 0   2 0  Feeling bad or failure about yourself  0   0 0  Trouble concentrating 1   1 0  Moving slowly or fidgety/restless 1   0 0  Suicidal thoughts 0   0 0  PHQ-9 Score '10   9 3      '$ 04/10/2022   11:14 AM 10/24/2021   10:54 AM 04/25/2021   11:40 AM 05/30/2020    9:41 AM  GAD 7 : Generalized Anxiety Score  Nervous, Anxious, on Edge 1 0 1 1  Control/stop worrying 1 0 0 1  Worry too much - different things 1 1 0 1  Trouble relaxing 1 0 0 0  Restless 3 1 0 0  Easily annoyed or irritable 1 1  1 0  Afraid - awful might happen '2 1 1 '$ 0  Total GAD 7 Score '10 4 3 3          '$ 01/08/2022    3:06 PM 01/03/2022    1:41 PM 10/24/2021   10:31 AM 12/28/2020    1:40 PM 05/30/2020    9:34 AM  Fall Risk   Falls in the past year? '1 1 1 '$ 0 0  Number falls in past yr: 0 1 0 0 0  Injury with Fall? 0 0 0 0 0  Risk for fall due to :   No Fall Risks    Follow up  Falls prevention discussed Falls evaluation completed Falls prevention discussed Falls evaluation completed   Immunization History  Administered Date(s) Administered   Influenza Whole 06/07/2009, 05/28/2011, 05/28/2012   Influenza, High Dose Seasonal PF 05/20/2015   Influenza,inj,Quad PF,6+ Mos 05/26/2013, 05/31/2014   Moderna Sars-Covid-2 Vaccination 12/11/2019, 02/12/2020, 09/27/2020   Pneumococcal Conjugate-13 05/31/2014   Pneumococcal Polysaccharide-23 06/07/2009   Td 01/31/2003   Tdap 09/28/2011, 10/24/2021   Past Medical History:  Diagnosis Date    Arthritis    Asthma    a little?, no problems in several years   Bladder cancer ([place])    Bladder tumor    Cervical cancer screening 01/31/2012   Chicken pox as a child   Degenerative tear of acetabular labrum of left hip 02/26/2019   Dehydration 01/19/2020   Depression with anxiety 06/07/2009   Qualifier: Diagnosis of  By: [name] MD, Catherine     Dermatitis of external ear 07/11/2012   Diverticulosis    Essential hypertension, benign 10/13/2007   Qualifier: Diagnosis of  By: [name] MD, Catherine     Ganglion cyst 02/26/2019   Right elbow   Hiatal hernia with gastroesophageal reflux 10/26/2010   Qualifier: Diagnosis of  By: [name] MD, Catherine     Hyperglycemia 06/21/2013   Hyperlipidemia, mixed 10/16/2015   pt unaware   Hypertension    Left hip pain 07/10/2015   Lesion of breast 12/03/2016   benign; resolved   Low back pain 05/29/2015   Measles as a child   Mumps as a child   Osteopenia 01/03/2017   Overweight (BMI 25.0-29.9) 10/07/2008   Qualifier: Diagnosis of  By: [name] MD, [name]     Palpitations    several years ago, not currently   Pre-diabetes    Psoriasis    ears   RUQ pain 05/29/2015   Spider veins    Bilateral legs   Spinal stenosis    Tailor's bunionette, left 0/09/5850   Umbilical hernia    Urinary frequency 09/28/2011   Vertigo    Wears dentures    Upper,   Allergies  Allergen Reactions   Nutrasweet Aspartame [Aspartame] Diarrhea   Lisinopril Cough   Past Surgical History:  Procedure Laterality Date   ABDOMINAL SURGERY  1970's   BREAST BIOPSY Right 1980   BREAST EXCISIONAL BIOPSY   BREAST BIOPSY Left 1970   BREAST EXCISIONAL BIOPSY   BREAST CYST ASPIRATION     CATARACT EXTRACTION, BILATERAL     COLONOSCOPY     CYSTECTOMY     abdomen   CYSTOSCOPY W/ RETROGRADES Bilateral 04/27/2019   Procedure: CYSTOSCOPY WITH RETROGRADE PYELOGRAM;  Surgeon: Cleon Gustin, MD;  Location: [facility];  Service: Urology;  Laterality:  Bilateral;   EYE SURGERY Bilateral    cataract   INSERTION OF MESH N/A 06/11/2017   Procedure: INSERTION OF MESH;  Surgeon: Rolm Bookbinder, MD;  Location: [place];  Service: General;  Laterality: N/A;  BILATERAL TAP BLOCK   IR REMOVAL TUN ACCESS W/ PORT W/O FL MOD SED  11/09/2020   TRANSURETHRAL RESECTION OF BLADDER TUMOR N/A 04/27/2019   Procedure: TRANSURETHRAL RESECTION OF BLADDER TUMOR (TURBT);  Surgeon: Cleon Gustin, MD;  Location: [facility];  Service: Urology;  Laterality: N/A;  1 HR   TRANSURETHRAL RESECTION OF BLADDER TUMOR N/A 06/01/2019   Procedure: TRANSURETHRAL RESECTION OF BLADDER TUMOR (TURBT);  Surgeon: Cleon Gustin, MD;  Location: [facility];  Service: Urology;  Laterality: N/A;  1 HR   TRANSURETHRAL RESECTION OF BLADDER TUMOR N/A 10/22/2019   Procedure: TRANSURETHRAL RESECTION OF BLADDER TUMOR (TURBT);  Surgeon: Cleon Gustin, MD;  Location: [facility];  Service: Urology;  Laterality: N/A;   TUBAL LIGATION  1970   VENTRAL HERNIA REPAIR N/A 06/11/2017   Procedure: LAPAROSCOPIC VENTRAL HERNIA REPAIR WITH MESH ERAS PATHWAY;  Surgeon: Rolm Bookbinder, MD;  Location: [place];  Service: General;  Laterality: N/A;  BILATERAL TAP BLOCK   Family History  Problem Relation Age of Onset   Hypertension Mother    Anxiety disorder Mother    Ovarian cancer Mother 50       lung- smoker, ovarian   Lung cancer Mother        smoker   Arthritis Mother    Hyperlipidemia Mother    Alcohol abuse Father    Diabetes Sister    Hypertension Sister    Ovarian cancer Sister    Arthritis Sister    Depression Sister    Hyperlipidemia Sister    Asthma Sister    Depression Sister    Hyperlipidemia Sister    Breast cancer Sister    Asthma Sister    Depression Sister    Arthritis Brother    COPD Brother    Heart attack Brother    Hyperlipidemia Brother    Stroke Brother    Prostate cancer Brother    Other Maternal  Grandmother        pacemaker   Multiple sclerosis Maternal Grandfather        ?   Asthma Paternal Uncle    Social History   Social History Narrative   Retired. Lives alone.    Attended some business college.    Former smoker.    Smoke alarm in the home. Wears seat balt.    Wears dentures.    Feels safe in her relationships.        Allergies as of 04/10/2022       Reactions   Nutrasweet Aspartame [aspartame] Diarrhea   Lisinopril Cough        Medication List        Accurate as of April 10, 2022 12:34 PM. If you have any questions, ask your nurse or doctor.          KLOR-CON PO Take 40 mEq by mouth daily.   LORazepam 0.5 MG tablet Commonly known as: ATIVAN Take 1 tablet (0.5 mg total) by mouth 2 (two) times daily as needed for anxiety.   magnesium oxide 400 MG tablet Commonly known as: MAG-OX Take by mouth daily.   PARoxetine 20 MG tablet Commonly known as: PAXIL Take 1 tablet (20 mg total) by mouth daily.   rivaroxaban 10 MG Tabs tablet Commonly known as: XARELTO Take 1 tablet (10 mg total) by mouth daily.   VITAMIN D PO Take by mouth.  All past medical history, surgical history, allergies, family history, immunizations andmedications were updated in the EMR today and reviewed under the history and medication portions of their EMR.       ROS 14 pt review of systems performed and negative (unless mentioned in an HPI)  Objective: BP 127/68   Pulse 77   Temp (!) 97.5 F (36.4 C) (Oral)   Ht '5\' 5"'$  (1.651 m)   Wt 173 lb (78.5 kg)   SpO2 96%   BMI 28.79 kg/m  Physical Exam Vitals and nursing note reviewed.  Constitutional:      General: She is not in acute distress.    Appearance: Normal appearance. She is normal weight. She is not ill-appearing or toxic-appearing.  Eyes:     Extraocular Movements: Extraocular movements intact.     Conjunctiva/sclera: Conjunctivae normal.     Pupils: Pupils are equal, round, and reactive to light.   Neurological:     Mental Status: She is alert and oriented to person, place, and time. Mental status is at baseline.  Psychiatric:        Mood and Affect: Mood normal.        Behavior: Behavior normal.        Thought Content: Thought content normal.        Judgment: Judgment normal.     No results found.  Assessment/plan: Christina [name] is a 81 y.o. female present for [facility] Situational anxiety Stable.  Continue paxil 20 mg qd/  Ativan qhs prn- [facility] database reviewed.   History of bladder cancer Stable. Following with onc  Return in about 7 months (around 10/25/2022) for cpe (20 min), Routine chronic condition follow-up.  No orders of the defined types were placed in this encounter.   Meds ordered this encounter  Medications   PARoxetine (PAXIL) 20 MG tablet    Sig: Take 1 tablet (20 mg total) by mouth daily.    Dispense:  90 tablet    Refill:  1   LORazepam (ATIVAN) 0.5 MG tablet    Sig: Take 1 tablet (0.5 mg total) by mouth 2 (two) times daily as needed for anxiety.    Dispense:  60 tablet    Refill:  5   Referral Orders  No referral(s) requested today     Note is dictated utilizing voice recognition software. Although note has been proof read prior to signing, occasional typographical errors still can be missed. If any questions arise, please do not hesitate to call for verification.  Electronically signed by: Howard Pouch, DO [place]

## 2022-04-10 NOTE — Patient Instructions (Addendum)
Return in about 7 months (around 10/25/2022) for cpe (20 min).        Great to see you today.  I have refilled the medication(s) we provide.   If labs were collected, we will inform you of lab results once received either by echart message or telephone call.   - echart message- for normal results that have been seen by the patient already.   - telephone call: abnormal results or if patient has not viewed results in their echart.

## 2022-04-11 ENCOUNTER — Ambulatory Visit: Payer: Medicare PPO | Admitting: Hematology & Oncology

## 2022-04-11 ENCOUNTER — Other Ambulatory Visit: Payer: Medicare PPO

## 2022-04-17 ENCOUNTER — Inpatient Hospital Stay: Payer: Medicare PPO

## 2022-04-17 ENCOUNTER — Encounter: Payer: Self-pay | Admitting: Hematology & Oncology

## 2022-04-17 ENCOUNTER — Inpatient Hospital Stay: Payer: Medicare PPO | Attending: Hematology & Oncology | Admitting: Hematology & Oncology

## 2022-04-17 VITALS — BP 131/70 | HR 72 | Temp 98.3°F | Resp 18 | Ht 64.5 in | Wt 173.0 lb

## 2022-04-17 DIAGNOSIS — Z8551 Personal history of malignant neoplasm of bladder: Secondary | ICD-10-CM

## 2022-04-17 DIAGNOSIS — Z7901 Long term (current) use of anticoagulants: Secondary | ICD-10-CM | POA: Diagnosis not present

## 2022-04-17 DIAGNOSIS — C679 Malignant neoplasm of bladder, unspecified: Secondary | ICD-10-CM | POA: Insufficient documentation

## 2022-04-17 DIAGNOSIS — Z9221 Personal history of antineoplastic chemotherapy: Secondary | ICD-10-CM | POA: Insufficient documentation

## 2022-04-17 DIAGNOSIS — Z79899 Other long term (current) drug therapy: Secondary | ICD-10-CM | POA: Insufficient documentation

## 2022-04-17 LAB — CBC WITH DIFFERENTIAL (CANCER CENTER ONLY)
Abs Immature Granulocytes: 0.01 10*3/uL (ref 0.00–0.07)
Basophils Absolute: 0 10*3/uL (ref 0.0–0.1)
Basophils Relative: 1 %
Eosinophils Absolute: 0.1 10*3/uL (ref 0.0–0.5)
Eosinophils Relative: 2 %
HCT: 37.9 % (ref 36.0–46.0)
Hemoglobin: 12.2 g/dL (ref 12.0–15.0)
Immature Granulocytes: 0 %
Lymphocytes Relative: 37 %
Lymphs Abs: 1.9 10*3/uL (ref 0.7–4.0)
MCH: 32.5 pg (ref 26.0–34.0)
MCHC: 32.2 g/dL (ref 30.0–36.0)
MCV: 101.1 fL — ABNORMAL HIGH (ref 80.0–100.0)
Monocytes Absolute: 0.4 10*3/uL (ref 0.1–1.0)
Monocytes Relative: 9 %
Neutro Abs: 2.6 10*3/uL (ref 1.7–7.7)
Neutrophils Relative %: 51 %
Platelet Count: 214 10*3/uL (ref 150–400)
RBC: 3.75 MIL/uL — ABNORMAL LOW (ref 3.87–5.11)
RDW: 14.5 % (ref 11.5–15.5)
WBC Count: 5 10*3/uL (ref 4.0–10.5)
nRBC: 0 % (ref 0.0–0.2)

## 2022-04-17 LAB — CMP (CANCER CENTER ONLY)
ALT: 10 U/L (ref 0–44)
AST: 18 U/L (ref 15–41)
Albumin: 4.1 g/dL (ref 3.5–5.0)
Alkaline Phosphatase: 61 U/L (ref 38–126)
Anion gap: 6 (ref 5–15)
BUN: 21 mg/dL (ref 8–23)
CO2: 28 mmol/L (ref 22–32)
Calcium: 10.4 mg/dL — ABNORMAL HIGH (ref 8.9–10.3)
Chloride: 104 mmol/L (ref 98–111)
Creatinine: 1.19 mg/dL — ABNORMAL HIGH (ref 0.44–1.00)
GFR, Estimated: 46 mL/min — ABNORMAL LOW (ref >60)
Glucose, Bld: 116 mg/dL — ABNORMAL HIGH (ref 70–99)
Potassium: 4.6 mmol/L (ref 3.5–5.1)
Sodium: 138 mmol/L (ref 135–145)
Total Bilirubin: 1.1 mg/dL (ref 0.3–1.2)
Total Protein: 6.7 g/dL (ref 6.5–8.1)

## 2022-04-17 LAB — LACTATE DEHYDROGENASE: LDH: 137 U/L (ref 98–192)

## 2022-04-17 NOTE — Progress Notes (Signed)
Hematology and Oncology Follow Up Visit  Christina [name] [id number] 25-Jan-1941 81 y.o. 04/17/2022   Principle Diagnosis:  Stage IIIB (Y5K3T4) invasive urothelial carcinoma of the bladder  Subsegmental blood clot in right lower lung  Current Therapy:   Status post neoadjuvant chemotherapy with ddMVAC Radical cystectomy on 03/03/2020  Xarelto 20 mg p.o. daily-6 months of therapy to start on 06/30/2021 -  changed to Xarelto 10 mg po q day on 12/26/2021     Interim History:  Christina [name] is back for follow-up.  Unfortunate, she still have a lot of problems with her back and left leg.  We ultimately did get an MRI of the spine.  This was done on 03/21/2022.  This did show a small synovial cyst at the L5-S1 region.  This appears to be contacting the left S1 nerve root.  She went to see the spinal surgeon.  He did not feel that there is anything he can do to help with this.  He did not think that surgery could help.  Again, she still is having a lot of problems.  I think we will have to get a second opinion regarding this synovial cyst.  She is post to see a pain specialist but this will not be until October 5.  Otherwise she seems to be doing okay.  She has had no cough or shortness of breath.  She is on low-dose Xarelto for the subsegmental blood clot in the right lung.  She has had no nausea or vomiting.  There is no change in bowel or bladder habits.  Overall, her performance status is ECOG 1.    Medications:  Current Outpatient Medications:    magnesium oxide (MAG-OX) 400 MG tablet, Take by mouth daily., Disp: , Rfl:    PARoxetine (PAXIL) 20 MG tablet, Take 1 tablet (20 mg total) by mouth daily., Disp: 90 tablet, Rfl: 1   Potassium Chloride (KLOR-CON PO), Take 40 mEq by mouth daily., Disp: , Rfl:    rivaroxaban (XARELTO) 10 MG TABS tablet, Take 1 tablet (10 mg total) by mouth daily., Disp: 90 tablet, Rfl: 3   VITAMIN D PO, Take by mouth., Disp: , Rfl:    LORazepam (ATIVAN) 0.5 MG  tablet, Take 1 tablet (0.5 mg total) by mouth 2 (two) times daily as needed for anxiety. (Patient not taking: Reported on 04/17/2022), Disp: 60 tablet, Rfl: 5  Allergies:  Allergies  Allergen Reactions   Nutrasweet Aspartame [Aspartame] Diarrhea   Lisinopril Cough    Past Medical History, Surgical history, Social history, and Family History were reviewed and updated.  Review of Systems: Review of Systems  Constitutional: Negative.   HENT:  Negative.    Eyes: Negative.   Respiratory: Negative.    Cardiovascular: Negative.   Gastrointestinal: Negative.   Endocrine: Negative.   Genitourinary: Negative.    Musculoskeletal: Negative.   Skin: Negative.   Neurological: Negative.   Hematological: Negative.   Psychiatric/Behavioral: Negative.      Physical Exam:  height is 5' 4.5" (1.638 m) and weight is 173 lb (78.5 kg). Her oral temperature is 98.3 F (36.8 C). Her blood pressure is 131/70 and her pulse is 72. Her respiration is 18 and oxygen saturation is 99%.   Wt Readings from Last 3 Encounters:  04/17/22 173 lb (78.5 kg)  04/10/22 173 lb (78.5 kg)  03/06/22 173 lb 12.8 oz (78.8 kg)    Physical Exam Vitals reviewed.  HENT:     Head: Normocephalic and atraumatic.  Eyes:  Pupils: Pupils are equal, round, and reactive to light.  Cardiovascular:     Rate and Rhythm: Normal rate and regular rhythm.     Heart sounds: Normal heart sounds.  Pulmonary:     Effort: Pulmonary effort is normal.     Breath sounds: Normal breath sounds.  Abdominal:     General: Bowel sounds are normal.     Palpations: Abdomen is soft.     Comments: Abdominal exam shows the healed laparotomy scar in the midline.  She has some firmness in the middle of the laparotomy scar.  She has the urostomy bag which is well-positioned.  Urine is clear.  There is no fluid wave in the abdomen.  Bowel sounds are present.  She has no palpable liver or spleen tip.  Musculoskeletal:        General: No tenderness  or deformity. Normal range of motion.     Cervical back: Normal range of motion.  Lymphadenopathy:     Cervical: No cervical adenopathy.  Skin:    General: Skin is warm and dry.     Findings: No erythema or rash.  Neurological:     Mental Status: She is alert and oriented to person, place, and time.  Psychiatric:        Behavior: Behavior normal.        Thought Content: Thought content normal.        Judgment: Judgment normal.     Lab Results  Component Value Date   WBC 5.0 04/17/2022   HGB 12.2 04/17/2022   HCT 37.9 04/17/2022   MCV 101.1 (H) 04/17/2022   PLT 214 04/17/2022     Chemistry      Component Value Date/Time   NA 138 03/06/2022 1449   NA 142 03/04/2017 1016   K 4.6 03/06/2022 1449   CL 105 03/06/2022 1449   CO2 27 03/06/2022 1449   BUN 17 03/06/2022 1449   BUN 13 03/04/2017 1016   CREATININE 1.16 (H) 03/06/2022 1449   CREATININE 0.78 05/20/2015 1704      Component Value Date/Time   CALCIUM 10.3 03/06/2022 1449   ALKPHOS 68 03/06/2022 1449   AST 15 03/06/2022 1449   ALT 7 03/06/2022 1449   BILITOT 1.0 03/06/2022 1449      Impression and Plan: Christina [name] is a 81 year old white female.  She had superficial bladder cancer.  She was treated with intravesicular therapy.  However, she has had had muscle invasive cancer which was poorly differentiated.  She then underwent neoadjuvant chemotherapy followed by radical cystectomy.  She is found to have stage IIIB disease.  Clearly, this is an anatomic issue with respect to her lower back.  Again she has this synovial cyst.  By MRI, the cyst appears to be contacting the left S1 nerve root.  This is where she has her radicular pain.  We will see if a second opinion can give a different recommendation.  If not, then hopefully she can get radio ablation of the spinal nerves.  I will plan to see her back myself in a couple months.  Hopefully, maybe, she will have some relief.   Volanda Napoleon, MD 8/22/202310:16  AM

## 2022-04-20 DIAGNOSIS — M5451 Vertebrogenic low back pain: Secondary | ICD-10-CM | POA: Diagnosis not present

## 2022-05-29 ENCOUNTER — Inpatient Hospital Stay: Payer: Medicare PPO | Admitting: Hematology & Oncology

## 2022-05-29 ENCOUNTER — Inpatient Hospital Stay: Payer: Medicare PPO | Attending: Hematology & Oncology

## 2022-05-29 ENCOUNTER — Encounter: Payer: Self-pay | Admitting: Hematology & Oncology

## 2022-05-29 VITALS — BP 140/74 | HR 76 | Temp 98.7°F | Resp 20 | Ht 65.0 in | Wt 171.0 lb

## 2022-05-29 DIAGNOSIS — Z86711 Personal history of pulmonary embolism: Secondary | ICD-10-CM | POA: Diagnosis not present

## 2022-05-29 DIAGNOSIS — Z9221 Personal history of antineoplastic chemotherapy: Secondary | ICD-10-CM | POA: Insufficient documentation

## 2022-05-29 DIAGNOSIS — Z8551 Personal history of malignant neoplasm of bladder: Secondary | ICD-10-CM | POA: Diagnosis not present

## 2022-05-29 DIAGNOSIS — M79605 Pain in left leg: Secondary | ICD-10-CM | POA: Insufficient documentation

## 2022-05-29 DIAGNOSIS — Z79899 Other long term (current) drug therapy: Secondary | ICD-10-CM | POA: Insufficient documentation

## 2022-05-29 DIAGNOSIS — Z7901 Long term (current) use of anticoagulants: Secondary | ICD-10-CM | POA: Diagnosis not present

## 2022-05-29 DIAGNOSIS — M7138 Other bursal cyst, other site: Secondary | ICD-10-CM | POA: Insufficient documentation

## 2022-05-29 DIAGNOSIS — Z936 Other artificial openings of urinary tract status: Secondary | ICD-10-CM | POA: Diagnosis not present

## 2022-05-29 DIAGNOSIS — C679 Malignant neoplasm of bladder, unspecified: Secondary | ICD-10-CM | POA: Diagnosis not present

## 2022-05-29 LAB — CMP (CANCER CENTER ONLY)
ALT: 7 U/L (ref 0–44)
AST: 16 U/L (ref 15–41)
Albumin: 4.2 g/dL (ref 3.5–5.0)
Alkaline Phosphatase: 61 U/L (ref 38–126)
Anion gap: 7 (ref 5–15)
BUN: 22 mg/dL (ref 8–23)
CO2: 28 mmol/L (ref 22–32)
Calcium: 11.1 mg/dL — ABNORMAL HIGH (ref 8.9–10.3)
Chloride: 107 mmol/L (ref 98–111)
Creatinine: 1.17 mg/dL — ABNORMAL HIGH (ref 0.44–1.00)
GFR, Estimated: 47 mL/min — ABNORMAL LOW (ref >60)
Glucose, Bld: 118 mg/dL — ABNORMAL HIGH (ref 70–99)
Potassium: 4.4 mmol/L (ref 3.5–5.1)
Sodium: 142 mmol/L (ref 135–145)
Total Bilirubin: 0.8 mg/dL (ref 0.3–1.2)
Total Protein: 6.7 g/dL (ref 6.5–8.1)

## 2022-05-29 LAB — CBC WITH DIFFERENTIAL (CANCER CENTER ONLY)
Abs Immature Granulocytes: 0.02 10*3/uL (ref 0.00–0.07)
Basophils Absolute: 0 10*3/uL (ref 0.0–0.1)
Basophils Relative: 1 %
Eosinophils Absolute: 0.1 10*3/uL (ref 0.0–0.5)
Eosinophils Relative: 1 %
HCT: 39.4 % (ref 36.0–46.0)
Hemoglobin: 12.7 g/dL (ref 12.0–15.0)
Immature Granulocytes: 0 %
Lymphocytes Relative: 31 %
Lymphs Abs: 1.7 10*3/uL (ref 0.7–4.0)
MCH: 32.8 pg (ref 26.0–34.0)
MCHC: 32.2 g/dL (ref 30.0–36.0)
MCV: 101.8 fL — ABNORMAL HIGH (ref 80.0–100.0)
Monocytes Absolute: 0.4 10*3/uL (ref 0.1–1.0)
Monocytes Relative: 7 %
Neutro Abs: 3.4 10*3/uL (ref 1.7–7.7)
Neutrophils Relative %: 60 %
Platelet Count: 209 10*3/uL (ref 150–400)
RBC: 3.87 MIL/uL (ref 3.87–5.11)
RDW: 13.3 % (ref 11.5–15.5)
WBC Count: 5.7 10*3/uL (ref 4.0–10.5)
nRBC: 0 % (ref 0.0–0.2)

## 2022-05-29 LAB — LACTATE DEHYDROGENASE: LDH: 143 U/L (ref 98–192)

## 2022-05-29 NOTE — Progress Notes (Signed)
Hematology and Oncology Follow Up Visit  LABRESHA MELLOR [id number] [birth year] 81 y.o. 05/29/2022   Principle Diagnosis:  Stage IIIB (T1N3M0) invasive urothelial carcinoma of the bladder  Subsegmental blood clot in right lower lung  Current Therapy:   Status post neoadjuvant chemotherapy with ddMVAC Radical cystectomy on 03/03/2020  Xarelto 20 mg p.o. daily-6 months of therapy to start on 06/30/2021 -  changed to Xarelto 10 mg po q day on 12/26/2021     Interim History:  Ms. Kayes is back for follow-up.  She is doing a little bit better with her back.  She does have a synovial cyst at L5-S1.  Every now and then, she does have pain in the left leg.  There is no role for surgery.  I think she has been seen Spinal surgery however.  They are just follow her along.  She is has a urostomy bag.  This is draining clear urine.  She has had no abdominal pain.  There has been no nausea or vomiting.  She has had no cough or shortness of breath.  She has had no headache.  She continues on the Xarelto.  Is on 10 mg a day of Xarelto.  This was for a pulmonary embolism.  Her last CT angiogram that was done back in January 2023 did not show any residual pulmonary embolism.  However, I would like to keep her on the Xarelto probably through the year until May 2024.  She has had a good appetite.  She is trying to do yard work.  Overall, I would say performance status is probably ECOG 1.   Medications:  Current Outpatient Medications:    LORazepam (ATIVAN) 0.5 MG tablet, Take 1 tablet (0.5 mg total) by mouth 2 (two) times daily as needed for anxiety., Disp: 60 tablet, Rfl: 5   magnesium oxide (MAG-OX) 400 MG tablet, Take by mouth daily., Disp: , Rfl:    PARoxetine (PAXIL) 20 MG tablet, Take 1 tablet (20 mg total) by mouth daily., Disp: 90 tablet, Rfl: 1   Potassium Chloride (KLOR-CON PO), Take 40 mEq by mouth daily., Disp: , Rfl:    rivaroxaban (XARELTO) 10 MG TABS tablet, Take 1 tablet (10 mg total)  by mouth daily., Disp: 90 tablet, Rfl: 3   VITAMIN D PO, Take 1,000 Int'l Units/1.43m by mouth., Disp: , Rfl:   Allergies:  Allergies  Allergen Reactions   Nutrasweet Aspartame [Aspartame] Diarrhea   Lisinopril Cough    Past Medical History, Surgical history, Social history, and Family History were reviewed and updated.  Review of Systems: Review of Systems  Constitutional: Negative.   HENT:  Negative.    Eyes: Negative.   Respiratory: Negative.    Cardiovascular: Negative.   Gastrointestinal: Negative.   Endocrine: Negative.   Genitourinary: Negative.    Musculoskeletal: Negative.   Skin: Negative.   Neurological: Negative.   Hematological: Negative.   Psychiatric/Behavioral: Negative.      Physical Exam:  height is '5\' 5"'$  (1.651 m) and weight is 171 lb (77.6 kg). Her oral temperature is 98.7 F (37.1 C). Her blood pressure is 140/74 (abnormal) and her pulse is 76. Her respiration is 20 and oxygen saturation is 98%.   Wt Readings from Last 3 Encounters:  05/29/22 171 lb (77.6 kg)  04/17/22 173 lb (78.5 kg)  04/10/22 173 lb (78.5 kg)    Physical Exam Vitals reviewed.  HENT:     Head: Normocephalic and atraumatic.  Eyes:     Pupils: Pupils are  equal, round, and reactive to light.  Cardiovascular:     Rate and Rhythm: Normal rate and regular rhythm.     Heart sounds: Normal heart sounds.  Pulmonary:     Effort: Pulmonary effort is normal.     Breath sounds: Normal breath sounds.  Abdominal:     General: Bowel sounds are normal.     Palpations: Abdomen is soft.     Comments: Abdominal exam shows the healed laparotomy scar in the midline.  She has some firmness in the middle of the laparotomy scar.  She has the urostomy bag which is well-positioned.  Urine is clear.  There is no fluid wave in the abdomen.  Bowel sounds are present.  She has no palpable liver or spleen tip.  Musculoskeletal:        General: No tenderness or deformity. Normal range of motion.      Cervical back: Normal range of motion.  Lymphadenopathy:     Cervical: No cervical adenopathy.  Skin:    General: Skin is warm and dry.     Findings: No erythema or rash.  Neurological:     Mental Status: She is alert and oriented to person, place, and time.  Psychiatric:        Behavior: Behavior normal.        Thought Content: Thought content normal.        Judgment: Judgment normal.    Lab Results  Component Value Date   WBC 5.7 05/29/2022   HGB 12.7 05/29/2022   HCT 39.4 05/29/2022   MCV 101.8 (H) 05/29/2022   PLT 209 05/29/2022     Chemistry      Component Value Date/Time   NA 138 04/17/2022 0942   NA 142 03/04/2017 1016   K 4.6 04/17/2022 0942   CL 104 04/17/2022 0942   CO2 28 04/17/2022 0942   BUN 21 04/17/2022 0942   BUN 13 03/04/2017 1016   CREATININE 1.19 (H) 04/17/2022 0942   CREATININE 0.78 05/20/2015 1704      Component Value Date/Time   CALCIUM 10.4 (H) 04/17/2022 0942   ALKPHOS 61 04/17/2022 0942   AST 18 04/17/2022 0942   ALT 10 04/17/2022 0942   BILITOT 1.1 04/17/2022 0942      Impression and Plan: Ms. Finchum is a 81 year old white female.  She had superficial bladder cancer.  She was treated with intravesicular therapy.  However, she has had had muscle invasive cancer which was poorly differentiated.  She then underwent neoadjuvant chemotherapy followed by radical cystectomy.  She is found to have stage IIIB disease.  At some point, we will have to repeat her CT scan.  Her last CT scan was done back in May.  I will get her set up for her in November.  Hopefully, her back will continue to improve.  She has this synovial cyst.  Again, surgery does not think that there is a role for surgical intervention.  I will like to see her back when she has her CT scan done.  We will get this set up in December.   Volanda Napoleon, MD 10/3/202310:38 AM

## 2022-05-31 DIAGNOSIS — D6869 Other thrombophilia: Secondary | ICD-10-CM | POA: Diagnosis not present

## 2022-07-01 IMAGING — DX DG LUMBAR SPINE 2-3V
3 series · 3 of 3 positions shown · non-contrast
Comparison: None.

CLINICAL DATA: Chronic low back pain

EXAM:
LUMBAR SPINE - 2-3 VIEW

[l-spine ap]
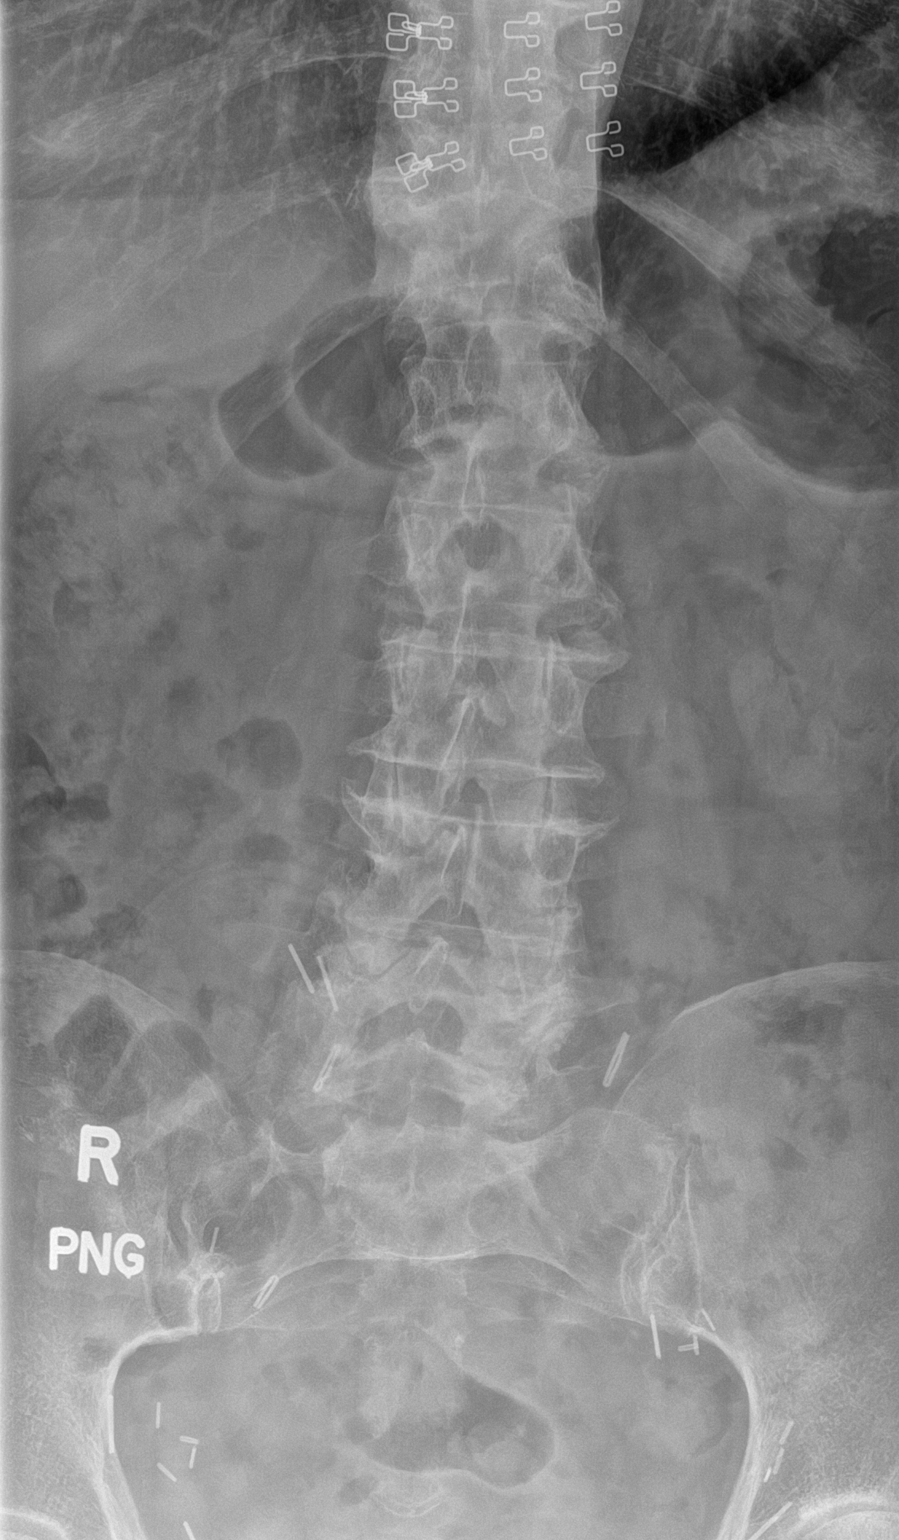

[l-spine lateral (1 of 2)]
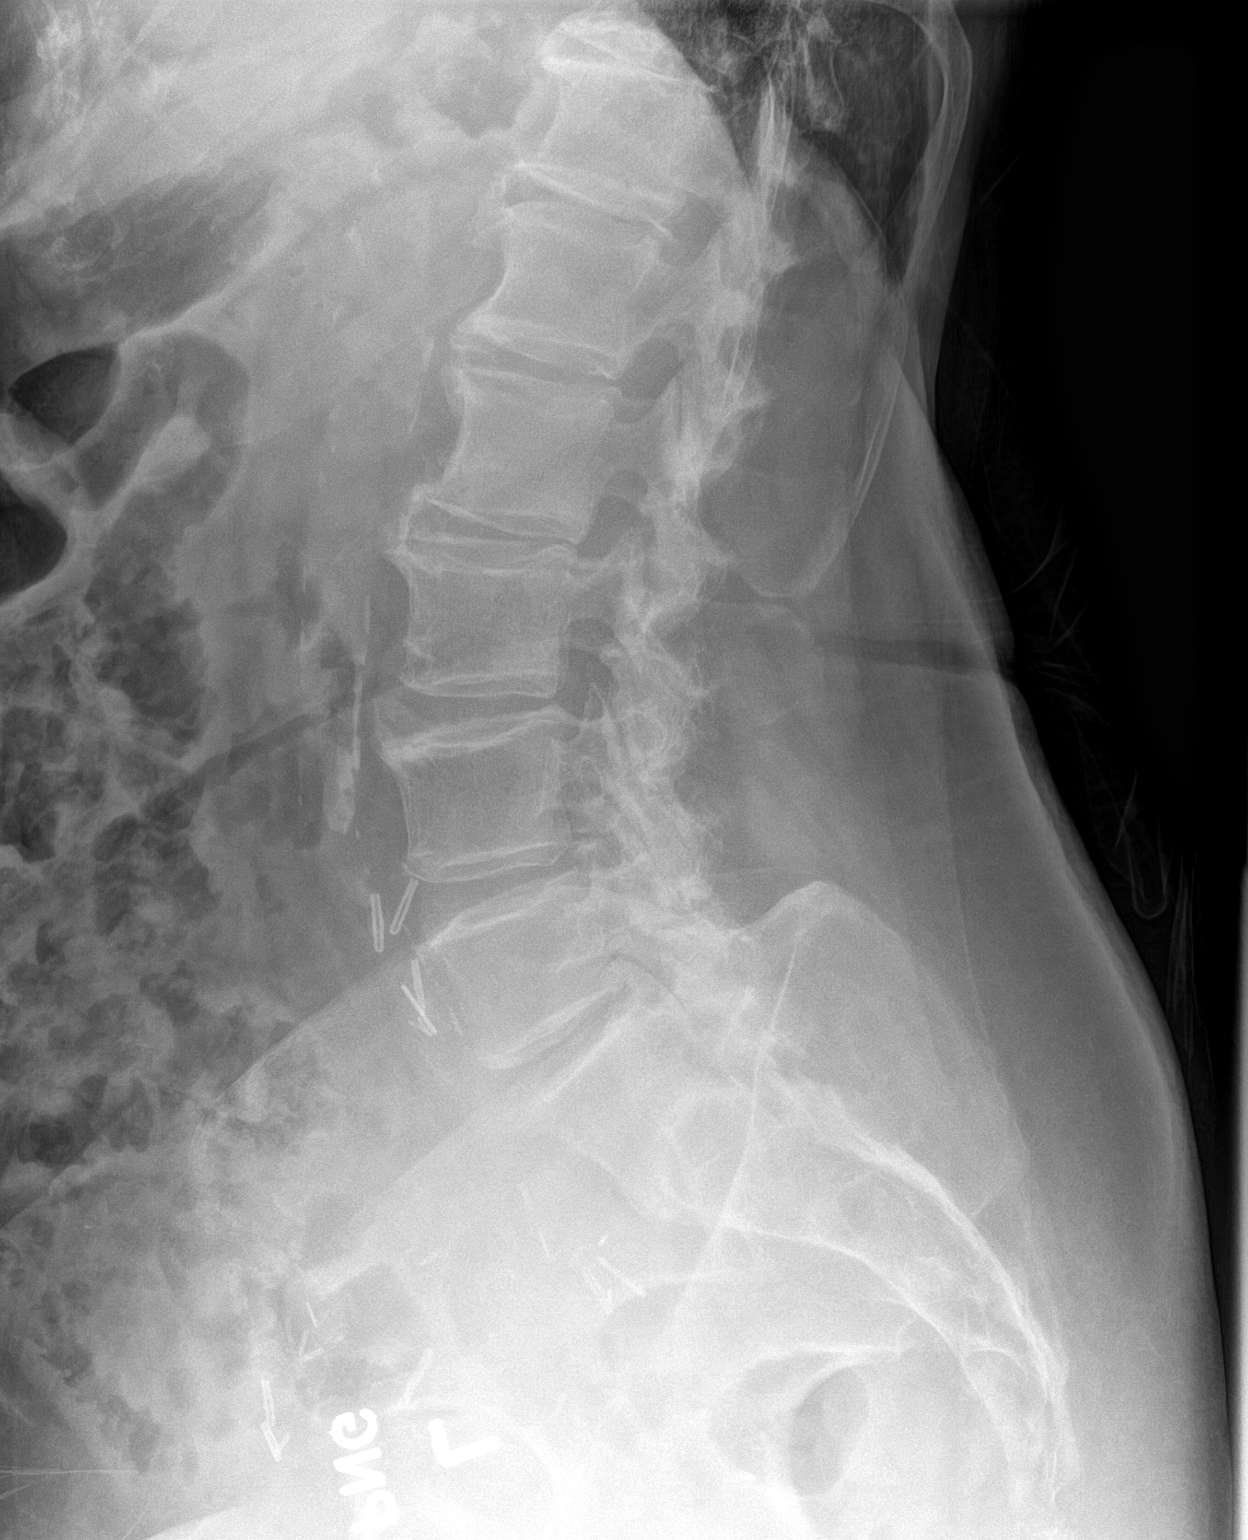

[l-spine lateral (2 of 2)]
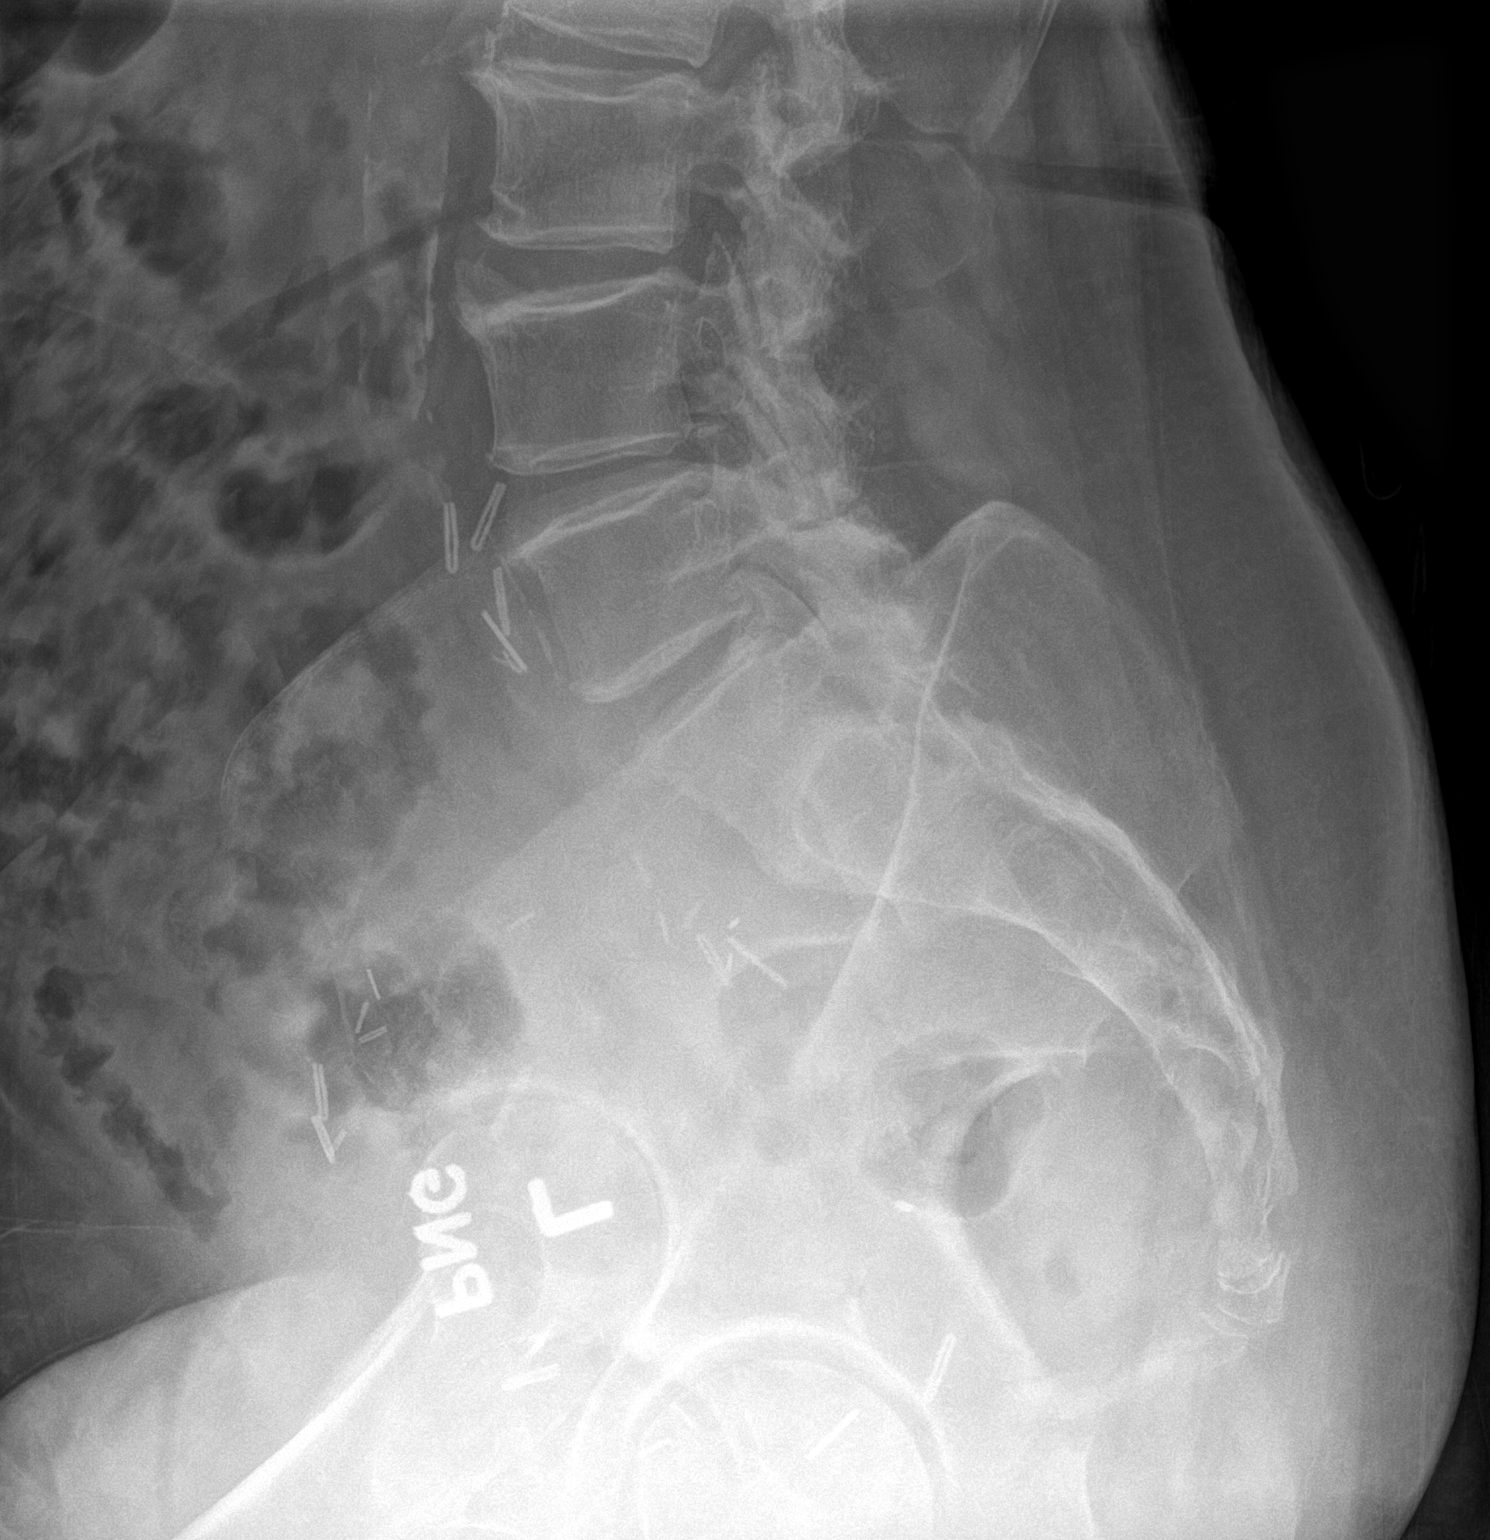

[3 of 3 positions shown; findings below may reference images not displayed]

FINDINGS: No fracture or dislocation of the lumbar spine. Mild to moderate
multilevel disc space height loss and osteophytosis throughout the
lumbar spine. Moderate to severe multilevel facet degenerative
change, worst at the lower lumbar levels, with apparent bony neural
foraminal stenosis. Nonobstructive pattern of overlying bowel gas.
Surgical clips about the pelvis.
IMPRESSION: No fracture or dislocation of the lumbar spine. Mild to moderate
multilevel disc space height loss and osteophytosis throughout the
lumbar spine. Moderate to severe multilevel facet degenerative
change, worst at the lower lumbar levels, with apparent bony neural
foraminal stenosis. Lumbar disc and neural foraminal stenosis may be
further evaluated by MRI if indicated by neurological signs and
symptoms.

## 2022-07-18 IMAGING — CT CT CHEST-ABD-PELV W/ CM
3 of 5 series · 15 of 36 positions shown, 17 images · IV contrast (Omnipaque)
Comparison: Multiple priors, most recent CT chest, abdomen and
pelvis dated December 15, 2020

CLINICAL DATA: Bladder cancer, status post surgery and chemo

EXAM:
CT CHEST, ABDOMEN, AND PELVIS WITH CONTRAST
TECHNIQUE: Multidetector CT imaging of the chest, abdomen and pelvis was
performed following the standard protocol during bolus
administration of intravenous contrast.
CONTRAST:  100mL OMNIPAQUE IOHEXOL 300 MG/ML  SOLN

[Series 2: cap with 2 · axial · 0.94mm/px · z∈[-748,-213]mm · 10 of 131 slices shown, 12 images]
[im 12/131  mediastinal]
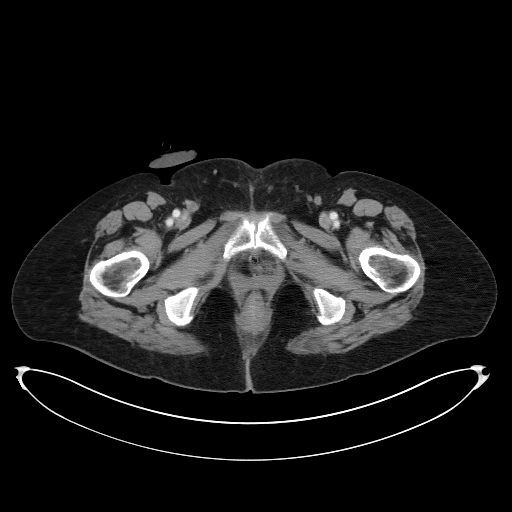
[im 12/131  bone]
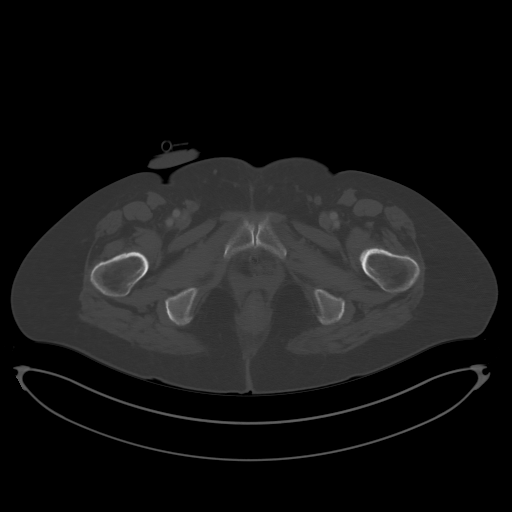
[im 24/131  mediastinal]
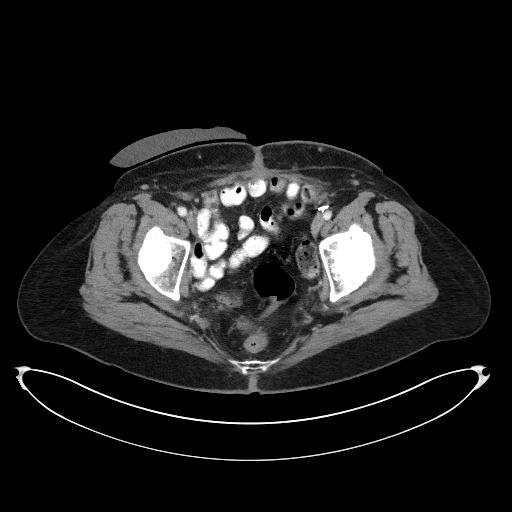
[im 36/131  mediastinal]
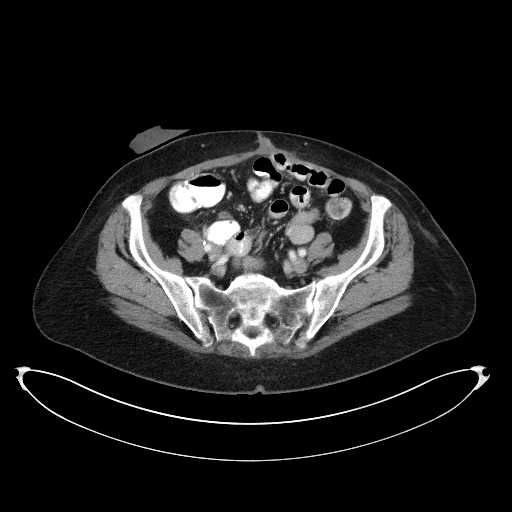
[im 48/131  mediastinal]
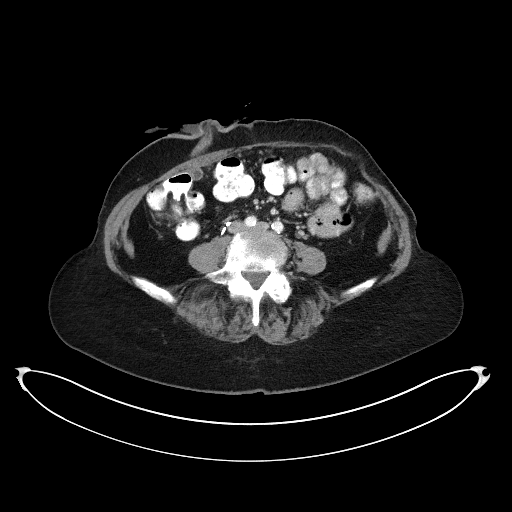
[im 60/131  mediastinal]
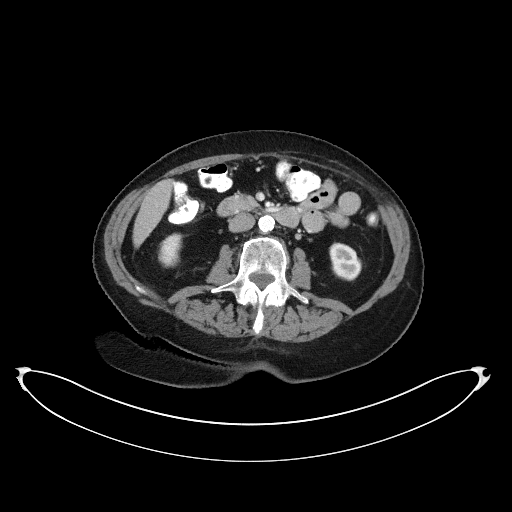
[im 71/131  mediastinal]
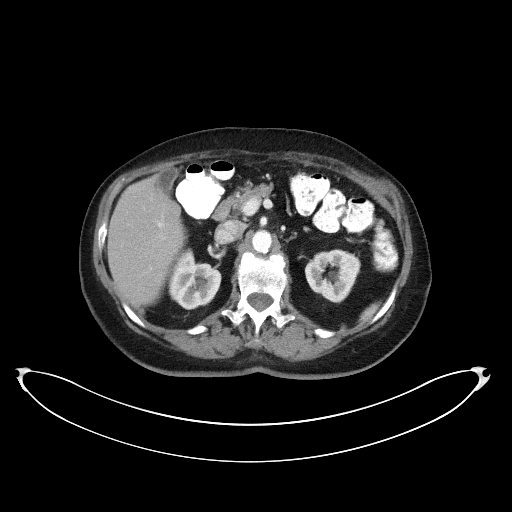
[im 83/131  mediastinal]
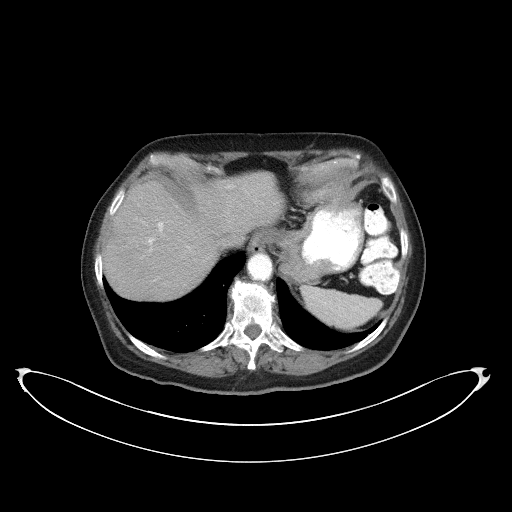
[im 95/131  mediastinal]
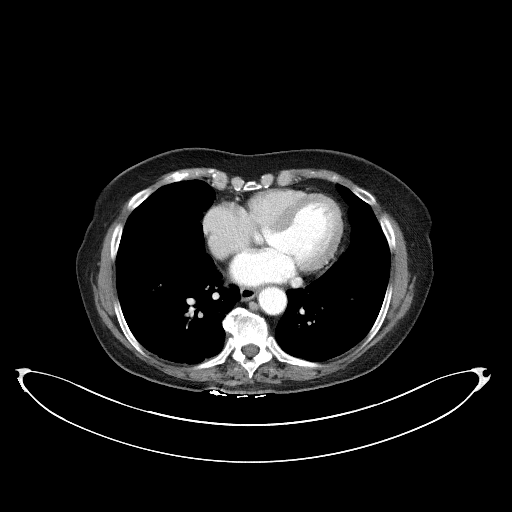
[im 107/131  mediastinal]
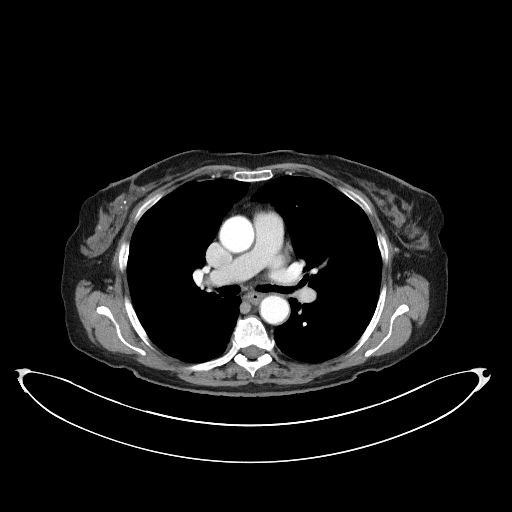
[im 107/131  bone]
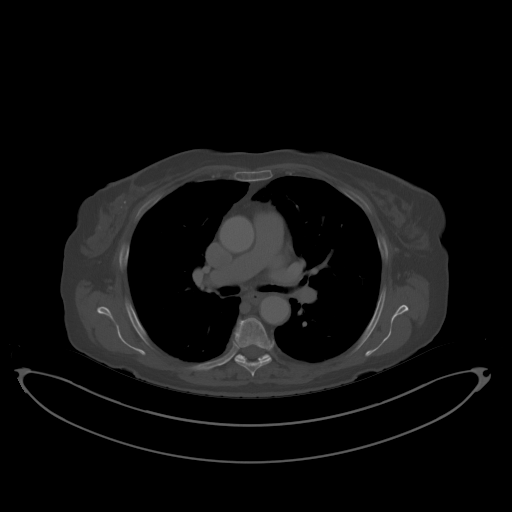
[im 119/131  mediastinal]
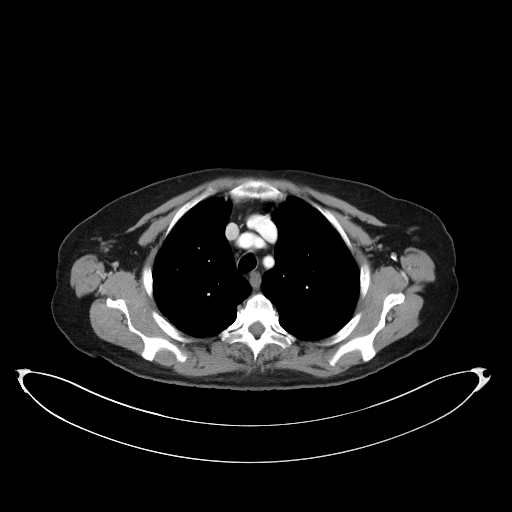

[Series 4: lung · axial · 0.94mm/px · z∈[-421,-373]mm · 2 of 147 slices shown]
[im 13/147  bone]
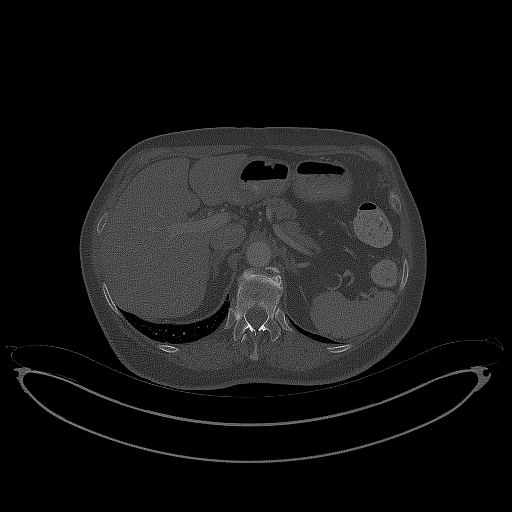
[im 37/147  bone]
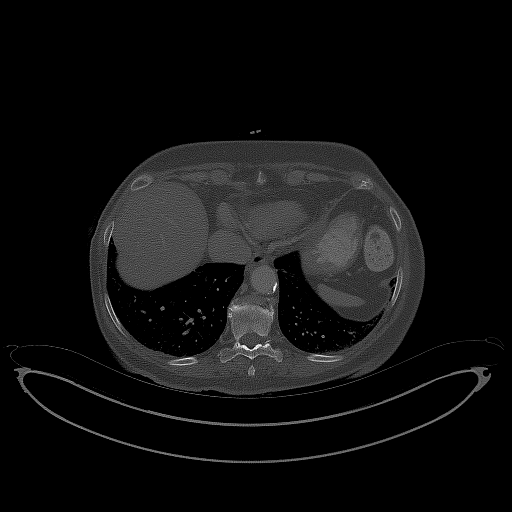

[Series 5: coronals · coronal · 0.88mm/px · 3 of 137 slices shown]
[im 28/137  mediastinal]
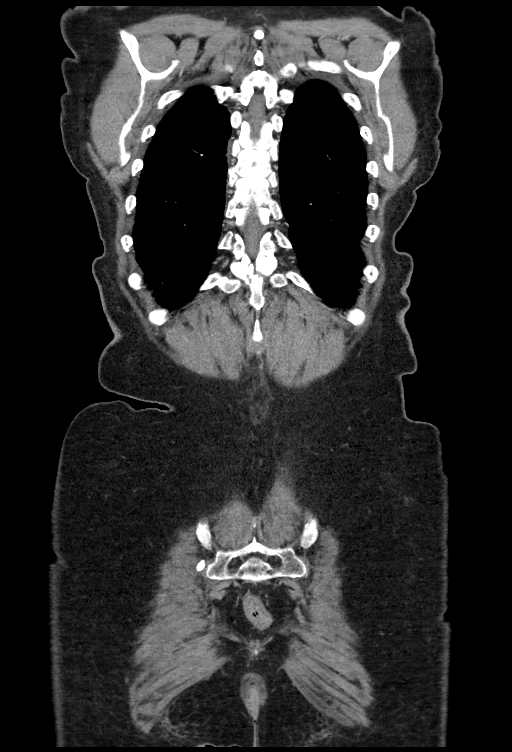
[im 55/137  mediastinal]
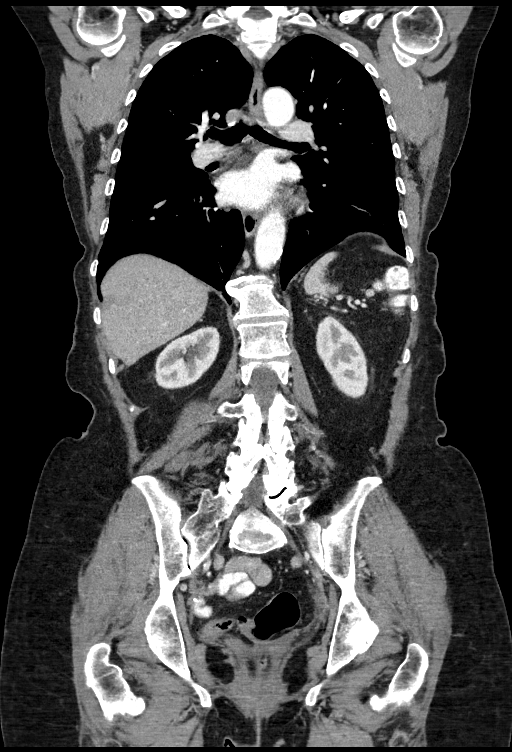
[im 82/137  mediastinal]
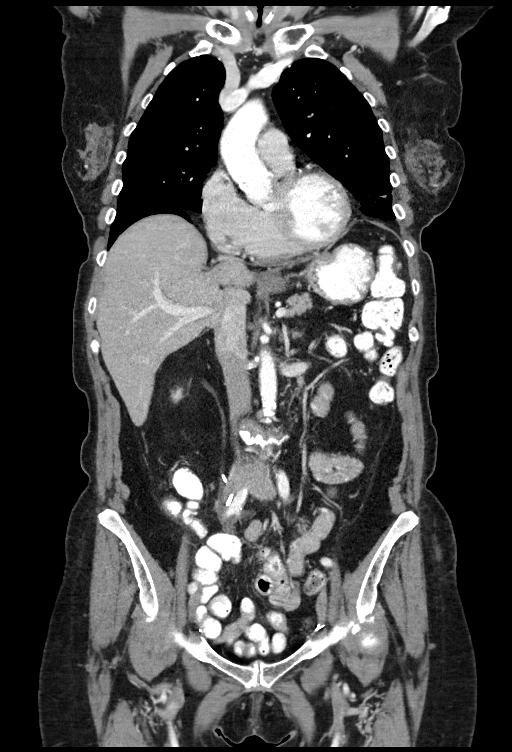

[15 of 36 positions shown; findings below may reference images not displayed]

FINDINGS: CT CHEST FINDINGS

Cardiovascular: No significant vascular findings. Normal heart size.
No pericardial effusion. Atherosclerotic disease of the thoracic
aorta.

Mediastinum/Nodes: No enlarged mediastinal, hilar, or axillary lymph
nodes. Thyroid gland, trachea, and esophagus demonstrate no
significant findings.

Lungs/Pleura: Central airways are patent. Centrilobular emphysema.
Stable small solid pulmonary nodules. Reference nodule of the left
lower lobe located on series 4 image 98 measuring 4 mm. Linear
opacities of the lingula, likely due to scarring or atelectasis.

Musculoskeletal: Small mass of the medial right breast located on
series 2, image 29, unchanged compared to prior exams and likely a
fibroadenoma. No aggressive appearing osseous lesions

CT ABDOMEN PELVIS FINDINGS

Hepatobiliary: No focal liver abnormality is seen. No gallstones,
gallbladder wall thickening, or biliary dilatation.

Pancreas: Unremarkable. No pancreatic ductal dilatation or
surrounding inflammatory changes.

Spleen: Normal in size without focal abnormality.

Adrenals/Urinary Tract: Unchanged size of left adrenal nodule,
likely an adenoma. Bilateral kidneys are unremarkable. Postsurgical
changes of prior cystectomy and right lower quadrant urinary
conduit.

Stomach/Bowel: Normal appearing stomach. Bowel is normal in caliber
with no evidence of obstruction. Normal appendix.

Vascular/Lymphatic: No pathologically enlarged lymph nodes seen in
the abdomen or pelvis. Atherosclerotic disease of the abdominal
aorta.

Reproductive: Status post hysterectomy. No adnexal masses.

Other: Trace free fluid in the pelvis.  No free intraperitoneal air.

Musculoskeletal: No aggressive appearing osseous lesions.
IMPRESSION: Stable postsurgical changes of cystectomy and right lower quadrant
ileal conduit. No evidence of recurrence or metastatic disease
within the chest, abdomen or pelvis.

Stable small bilateral solid pulmonary nodules.

## 2022-07-27 ENCOUNTER — Other Ambulatory Visit: Payer: Self-pay | Admitting: *Deleted

## 2022-07-27 DIAGNOSIS — Z936 Other artificial openings of urinary tract status: Secondary | ICD-10-CM

## 2022-07-27 DIAGNOSIS — Z8551 Personal history of malignant neoplasm of bladder: Secondary | ICD-10-CM

## 2022-07-27 DIAGNOSIS — M5416 Radiculopathy, lumbar region: Secondary | ICD-10-CM

## 2022-07-27 DIAGNOSIS — R079 Chest pain, unspecified: Secondary | ICD-10-CM

## 2022-07-27 DIAGNOSIS — M25422 Effusion, left elbow: Secondary | ICD-10-CM

## 2022-07-30 ENCOUNTER — Inpatient Hospital Stay: Payer: Medicare PPO | Attending: Hematology & Oncology

## 2022-07-30 ENCOUNTER — Inpatient Hospital Stay: Payer: Medicare PPO | Admitting: Hematology & Oncology

## 2022-07-30 ENCOUNTER — Other Ambulatory Visit: Payer: Self-pay

## 2022-07-30 ENCOUNTER — Encounter (HOSPITAL_BASED_OUTPATIENT_CLINIC_OR_DEPARTMENT_OTHER): Payer: Self-pay

## 2022-07-30 ENCOUNTER — Ambulatory Visit (HOSPITAL_BASED_OUTPATIENT_CLINIC_OR_DEPARTMENT_OTHER)
Admission: RE | Admit: 2022-07-30 | Discharge: 2022-07-30 | Disposition: A | Discharge status: Home / Self Care | Payer: Medicare PPO | Source: Ambulatory Visit | Attending: Hematology & Oncology | Admitting: Hematology & Oncology

## 2022-07-30 ENCOUNTER — Encounter: Payer: Self-pay | Admitting: Hematology & Oncology

## 2022-07-30 VITALS — BP 164/86 | HR 72 | Temp 97.9°F | Resp 18 | Ht 65.0 in | Wt 176.0 lb

## 2022-07-30 DIAGNOSIS — M7989 Other specified soft tissue disorders: Secondary | ICD-10-CM | POA: Diagnosis not present

## 2022-07-30 DIAGNOSIS — C679 Malignant neoplasm of bladder, unspecified: Secondary | ICD-10-CM | POA: Diagnosis not present

## 2022-07-30 DIAGNOSIS — Z8551 Personal history of malignant neoplasm of bladder: Secondary | ICD-10-CM | POA: Diagnosis not present

## 2022-07-30 DIAGNOSIS — Z936 Other artificial openings of urinary tract status: Secondary | ICD-10-CM

## 2022-07-30 DIAGNOSIS — M7138 Other bursal cyst, other site: Secondary | ICD-10-CM | POA: Insufficient documentation

## 2022-07-30 DIAGNOSIS — I700 Atherosclerosis of aorta: Secondary | ICD-10-CM | POA: Diagnosis not present

## 2022-07-30 DIAGNOSIS — Z9221 Personal history of antineoplastic chemotherapy: Secondary | ICD-10-CM | POA: Insufficient documentation

## 2022-07-30 DIAGNOSIS — K8689 Other specified diseases of pancreas: Secondary | ICD-10-CM | POA: Diagnosis not present

## 2022-07-30 DIAGNOSIS — Z7901 Long term (current) use of anticoagulants: Secondary | ICD-10-CM | POA: Insufficient documentation

## 2022-07-30 DIAGNOSIS — M5416 Radiculopathy, lumbar region: Secondary | ICD-10-CM

## 2022-07-30 DIAGNOSIS — Z79899 Other long term (current) drug therapy: Secondary | ICD-10-CM | POA: Insufficient documentation

## 2022-07-30 DIAGNOSIS — M549 Dorsalgia, unspecified: Secondary | ICD-10-CM | POA: Insufficient documentation

## 2022-07-30 DIAGNOSIS — K5730 Diverticulosis of large intestine without perforation or abscess without bleeding: Secondary | ICD-10-CM | POA: Insufficient documentation

## 2022-07-30 DIAGNOSIS — R911 Solitary pulmonary nodule: Secondary | ICD-10-CM | POA: Diagnosis not present

## 2022-07-30 DIAGNOSIS — Z86718 Personal history of other venous thrombosis and embolism: Secondary | ICD-10-CM | POA: Insufficient documentation

## 2022-07-30 DIAGNOSIS — D3501 Benign neoplasm of right adrenal gland: Secondary | ICD-10-CM | POA: Diagnosis not present

## 2022-07-30 DIAGNOSIS — M25422 Effusion, left elbow: Secondary | ICD-10-CM

## 2022-07-30 DIAGNOSIS — R079 Chest pain, unspecified: Secondary | ICD-10-CM

## 2022-07-30 LAB — CBC WITH DIFFERENTIAL (CANCER CENTER ONLY)
Abs Immature Granulocytes: 0.01 10*3/uL (ref 0.00–0.07)
Basophils Absolute: 0 10*3/uL (ref 0.0–0.1)
Basophils Relative: 1 %
Eosinophils Absolute: 0.1 10*3/uL (ref 0.0–0.5)
Eosinophils Relative: 1 %
HCT: 39.6 % (ref 36.0–46.0)
Hemoglobin: 12.8 g/dL (ref 12.0–15.0)
Immature Granulocytes: 0 %
Lymphocytes Relative: 39 %
Lymphs Abs: 1.9 10*3/uL (ref 0.7–4.0)
MCH: 32.2 pg (ref 26.0–34.0)
MCHC: 32.3 g/dL (ref 30.0–36.0)
MCV: 99.5 fL (ref 80.0–100.0)
Monocytes Absolute: 0.5 10*3/uL (ref 0.1–1.0)
Monocytes Relative: 9 %
Neutro Abs: 2.5 10*3/uL (ref 1.7–7.7)
Neutrophils Relative %: 50 %
Platelet Count: 197 10*3/uL (ref 150–400)
RBC: 3.98 MIL/uL (ref 3.87–5.11)
RDW: 13.2 % (ref 11.5–15.5)
WBC Count: 5 10*3/uL (ref 4.0–10.5)
nRBC: 0 % (ref 0.0–0.2)

## 2022-07-30 LAB — CMP (CANCER CENTER ONLY)
ALT: 8 U/L (ref 0–44)
AST: 16 U/L (ref 15–41)
Albumin: 4.1 g/dL (ref 3.5–5.0)
Alkaline Phosphatase: 72 U/L (ref 38–126)
Anion gap: 6 (ref 5–15)
BUN: 15 mg/dL (ref 8–23)
CO2: 28 mmol/L (ref 22–32)
Calcium: 10.3 mg/dL (ref 8.9–10.3)
Chloride: 105 mmol/L (ref 98–111)
Creatinine: 1.07 mg/dL — ABNORMAL HIGH (ref 0.44–1.00)
GFR, Estimated: 53 mL/min — ABNORMAL LOW (ref >60)
Glucose, Bld: 102 mg/dL — ABNORMAL HIGH (ref 70–99)
Potassium: 3.8 mmol/L (ref 3.5–5.1)
Sodium: 139 mmol/L (ref 135–145)
Total Bilirubin: 0.8 mg/dL (ref 0.3–1.2)
Total Protein: 6.7 g/dL (ref 6.5–8.1)

## 2022-07-30 LAB — LACTATE DEHYDROGENASE: LDH: 174 U/L (ref 98–192)

## 2022-07-30 MED ORDER — IOHEXOL 300 MG/ML  SOLN
100.0000 mL | Freq: Once | INTRAMUSCULAR | Status: AC | PRN
  Administered 2022-07-30: 100 mL via INTRAVENOUS

## 2022-07-30 NOTE — Progress Notes (Signed)
Hematology and Oncology Follow Up Visit  CAI FLOTT [id number] 08/25/1941 81 y.o. 07/30/2022   Principle Diagnosis:  Stage IIIB (T1N3M0) invasive urothelial carcinoma of the bladder  Subsegmental blood clot in right lower lung  Current Therapy:   Status post neoadjuvant chemotherapy with ddMVAC Radical cystectomy on 03/03/2020  Xarelto 20 mg p.o. daily-6 months of therapy to start on 06/30/2021 -  changed to Xarelto 10 mg po q day on 12/26/2021     Interim History:  Ms. Oetken is back for follow-up.  Everything seems to be going pretty well with her.  We last saw her back in October.  She did have a CT scan done today.  The CT scan did not show any evidence of recurrent bladder cancer.  She has had the urostomy bag.  This seems to be working well.  She has had no problems with cough or shortness of breath.  There is been no chest wall pain.  She is on Xarelto.  Because she is complaining of little bit of leg swelling.  This is the right leg.  I think we will have to do a Doppler just to make sure nothing is going on.  She had a good Thanksgiving.  Her appetite was quite good.  She ate well.  She still has back pain.  She has a synovial cyst at L5-S1.  She is not a surgical candidate.  She does seem to be doing pretty well with this.  She has had no fever.  She has had no bleeding.  She has had no headache.  Overall, I would say that her performance status is probably ECOG 1.   Medications:  Current Outpatient Medications:    LORazepam (ATIVAN) 0.5 MG tablet, Take 1 tablet (0.5 mg total) by mouth 2 (two) times daily as needed for anxiety., Disp: 60 tablet, Rfl: 5   magnesium oxide (MAG-OX) 400 MG tablet, Take by mouth daily., Disp: , Rfl:    PARoxetine (PAXIL) 20 MG tablet, Take 1 tablet (20 mg total) by mouth daily., Disp: 90 tablet, Rfl: 1   Potassium Chloride (KLOR-CON PO), Take 40 mEq by mouth daily., Disp: , Rfl:    rivaroxaban (XARELTO) 10 MG TABS tablet, Take 1  tablet (10 mg total) by mouth daily., Disp: 90 tablet, Rfl: 3   VITAMIN D PO, Take 1,000 Int'l Units/1.44m by mouth., Disp: , Rfl:   Allergies:  Allergies  Allergen Reactions   Nutrasweet Aspartame [Aspartame] Diarrhea   Lisinopril Cough    Past Medical History, Surgical history, Social history, and Family History were reviewed and updated.  Review of Systems: Review of Systems  Constitutional: Negative.   HENT:  Negative.    Eyes: Negative.   Respiratory: Negative.    Cardiovascular: Negative.   Gastrointestinal: Negative.   Endocrine: Negative.   Genitourinary: Negative.    Musculoskeletal: Negative.   Skin: Negative.   Neurological: Negative.   Hematological: Negative.   Psychiatric/Behavioral: Negative.      Physical Exam:  height is '5\' 5"'$  (1.651 m) and weight is 176 lb (79.8 kg). Her oral temperature is 97.9 F (36.6 C). Her blood pressure is 164/86 (abnormal) and her pulse is 72. Her respiration is 18 and oxygen saturation is 99%.   Wt Readings from Last 3 Encounters:  07/30/22 176 lb (79.8 kg)  05/29/22 171 lb (77.6 kg)  04/17/22 173 lb (78.5 kg)    Physical Exam Vitals reviewed.  HENT:     Head: Normocephalic and atraumatic.  Eyes:  Pupils: Pupils are equal, round, and reactive to light.  Cardiovascular:     Rate and Rhythm: Normal rate and regular rhythm.     Heart sounds: Normal heart sounds.  Pulmonary:     Effort: Pulmonary effort is normal.     Breath sounds: Normal breath sounds.  Abdominal:     General: Bowel sounds are normal.     Palpations: Abdomen is soft.     Comments: Abdominal exam shows the healed laparotomy scar in the midline.  She has some firmness in the middle of the laparotomy scar.  She has the urostomy bag which is well-positioned.  Urine is clear.  There is no fluid wave in the abdomen.  Bowel sounds are present.  She has no palpable liver or spleen tip.  Musculoskeletal:        General: No tenderness or deformity. Normal  range of motion.     Cervical back: Normal range of motion.  Lymphadenopathy:     Cervical: No cervical adenopathy.  Skin:    General: Skin is warm and dry.     Findings: No erythema or rash.  Neurological:     Mental Status: She is alert and oriented to person, place, and time.  Psychiatric:        Behavior: Behavior normal.        Thought Content: Thought content normal.        Judgment: Judgment normal.     Lab Results  Component Value Date   WBC 5.0 07/30/2022   HGB 12.8 07/30/2022   HCT 39.6 07/30/2022   MCV 99.5 07/30/2022   PLT 197 07/30/2022     Chemistry      Component Value Date/Time   NA 139 07/30/2022 1057   NA 142 03/04/2017 1016   K 3.8 07/30/2022 1057   CL 105 07/30/2022 1057   CO2 28 07/30/2022 1057   BUN 15 07/30/2022 1057   BUN 13 03/04/2017 1016   CREATININE 1.07 (H) 07/30/2022 1057   CREATININE 0.78 05/20/2015 1704      Component Value Date/Time   CALCIUM 10.3 07/30/2022 1057   ALKPHOS 72 07/30/2022 1057   AST 16 07/30/2022 1057   ALT 8 07/30/2022 1057   BILITOT 0.8 07/30/2022 1057      Impression and Plan: Ms. Tena is a 81 year old white female.  She had superficial bladder cancer.  She was treated with intravesicular therapy.  However, she has had had muscle invasive cancer which was poorly differentiated.  She then underwent neoadjuvant chemotherapy followed by radical cystectomy.  She is found to have stage IIIB disease.  I am glad the CT scan did not show any evidence of recurrent bladder cancer.  I think that she is at risk for recurrence because of the positive lymph nodes.  I do not think we have to do another CT scan probably until April.  We will get a Doppler of her right leg this week.  I would like to see her back myself in 2 months.  I think this is reasonable.  Hopefully, next year will be a quite year for her.   Volanda Napoleon, MD 12/4/202312:39 PM

## 2022-08-01 ENCOUNTER — Ambulatory Visit (HOSPITAL_BASED_OUTPATIENT_CLINIC_OR_DEPARTMENT_OTHER)
Admission: RE | Admit: 2022-08-01 | Discharge: 2022-08-01 | Disposition: A | Discharge status: Home / Self Care | Payer: Medicare PPO | Source: Ambulatory Visit | Attending: Hematology & Oncology | Admitting: Hematology & Oncology

## 2022-08-01 DIAGNOSIS — M7989 Other specified soft tissue disorders: Secondary | ICD-10-CM | POA: Insufficient documentation

## 2022-08-01 DIAGNOSIS — R600 Localized edema: Secondary | ICD-10-CM | POA: Diagnosis not present

## 2022-08-17 ENCOUNTER — Encounter: Payer: Self-pay | Admitting: Family Medicine

## 2022-08-17 ENCOUNTER — Ambulatory Visit: Payer: Medicare PPO | Admitting: Family Medicine

## 2022-08-17 VITALS — BP 120/60 | HR 85 | Temp 98.1°F | Resp 18 | Ht 65.0 in | Wt 169.1 lb

## 2022-08-17 DIAGNOSIS — J069 Acute upper respiratory infection, unspecified: Secondary | ICD-10-CM

## 2022-08-17 DIAGNOSIS — J029 Acute pharyngitis, unspecified: Secondary | ICD-10-CM | POA: Diagnosis not present

## 2022-08-17 LAB — POC COVID19 BINAXNOW: SARS Coronavirus 2 Ag: NEGATIVE

## 2022-08-17 LAB — POCT RESPIRATORY SYNCYTIAL VIRUS: RSV Rapid Ag: NEGATIVE

## 2022-08-17 LAB — POCT RAPID STREP A (OFFICE): Rapid Strep A Screen: NEGATIVE

## 2022-08-17 MED ORDER — BENZONATATE 200 MG PO CAPS: 200.0000 mg | ORAL_CAPSULE | Freq: Three times a day (TID) | ORAL | 0 refills | Status: DC | PRN

## 2022-08-17 NOTE — Progress Notes (Signed)
   Subjective:    Patient ID: Christina [name], female    DOB: 08-04-1941, 81 y.o.   MRN: [id number]  HPI URI- pt reports sxs started 'like a cold' but 'it got worse'.  + HA, ear pain, sore throat, cough.  Sxs started Monday.  Thinks things are turning the corner.  No fever.  Denies body aches above baseline.  Denies sinus pain/pressure.  HA is much better.  Cough is intermittently productive- some burning in chest.  Ear pain is better.  Sore throat has improved.   Review of Systems For ROS see HPI     Objective:   Physical Exam Vitals reviewed.  Constitutional:      General: She is not in acute distress.    Appearance: She is well-developed. She is not ill-appearing.  HENT:     Head: Normocephalic and atraumatic.     Right Ear: Tympanic membrane and ear canal normal.     Left Ear: Tympanic membrane and ear canal normal.     Nose: Congestion present. No rhinorrhea.     Right Sinus: No maxillary sinus tenderness or frontal sinus tenderness.     Left Sinus: No maxillary sinus tenderness or frontal sinus tenderness.  Eyes:     Conjunctiva/sclera: Conjunctivae normal.     Pupils: Pupils are equal, round, and reactive to light.  Cardiovascular:     Rate and Rhythm: Normal rate and regular rhythm.     Heart sounds: Normal heart sounds. No murmur heard. Pulmonary:     Effort: Pulmonary effort is normal. No respiratory distress.     Breath sounds: Normal breath sounds. No wheezing.     Comments: + deep hacking cough Musculoskeletal:     Cervical back: Normal range of motion and neck supple.  Lymphadenopathy:     Cervical: No cervical adenopathy.  Skin:    General: Skin is warm and dry.  Neurological:     General: No focal deficit present.     Mental Status: She is alert and oriented to person, place, and time.  Psychiatric:        Mood and Affect: Mood normal.        Behavior: Behavior normal.           Assessment & Plan:  URI- new.  Pt's cough sounds consistent w/ RSV  but in-office test was negative.  She is otherwise well appearing and lungs are CTAB.  No evidence of bacterial infxn.  Cough meds prn.  Reviewed supportive care and red flags that should prompt return.  Pt expressed understanding and is in agreement w/ plan.

## 2022-08-17 NOTE — Patient Instructions (Signed)
Follow up as needed or as scheduled Use the cough pills as needed Add Robitussin/Delsym as needed for cough Drink plenty of fluids REST!!! Call with any questions or concerns Hang in there! Happy Holidays!!!

## 2022-09-07 ENCOUNTER — Ambulatory Visit: Payer: Medicare PPO | Admitting: Family Medicine

## 2022-09-07 ENCOUNTER — Encounter: Payer: Self-pay | Admitting: Family Medicine

## 2022-09-07 VITALS — BP 119/72 | HR 86 | Temp 98.1°F | Wt 170.4 lb

## 2022-09-07 DIAGNOSIS — R5383 Other fatigue: Secondary | ICD-10-CM

## 2022-09-07 DIAGNOSIS — Z8551 Personal history of malignant neoplasm of bladder: Secondary | ICD-10-CM | POA: Diagnosis not present

## 2022-09-07 DIAGNOSIS — M533 Sacrococcygeal disorders, not elsewhere classified: Secondary | ICD-10-CM | POA: Diagnosis not present

## 2022-09-07 DIAGNOSIS — M545 Low back pain, unspecified: Secondary | ICD-10-CM | POA: Diagnosis not present

## 2022-09-07 LAB — POC URINALSYSI DIPSTICK (AUTOMATED)
Bilirubin, UA: NEGATIVE
Glucose, UA: NEGATIVE
Ketones, UA: NEGATIVE
Nitrite, UA: NEGATIVE
Protein, UA: POSITIVE — AB
Spec Grav, UA: 1.01 (ref 1.010–1.025)
Urobilinogen, UA: 0.2 E.U./dL
pH, UA: 7 (ref 5.0–8.0)

## 2022-09-07 MED ORDER — KETOROLAC TROMETHAMINE 30 MG/ML IJ SOLN
30.0000 mg | Freq: Once | INTRAMUSCULAR | Status: AC
  Administered 2022-09-07: 30 mg via INTRAMUSCULAR

## 2022-09-07 MED ORDER — CYCLOBENZAPRINE HCL 5 MG PO TABS: 5.0000 mg | ORAL_TABLET | Freq: Three times a day (TID) | ORAL | 0 refills | Status: DC | PRN

## 2022-09-07 MED ORDER — PREDNISONE 20 MG PO TABS: ORAL_TABLET | ORAL | 0 refills | Status: DC

## 2022-09-07 NOTE — Progress Notes (Signed)
Christina [name] , 1941/04/05, 82 y.o., female MRN: [id number] Patient Care Team    Relationship Specialty Notifications Start End  Ma Hillock, DO PCP - General Family Medicine  09/02/17   Rolm Bookbinder, MD Consulting Physician General Surgery  10/05/16   Monna Fam, MD Consulting Physician Ophthalmology  10/05/16   Garry Heater, DDS Consulting Physician Dentistry  10/05/16   Juanita Craver, MD Consulting Physician Gastroenterology  09/02/17   Volanda Napoleon, MD Medical Oncologist Oncology  05/14/19   Cleon Gustin, MD Consulting Physician Urology  01/12/21   Suella Broad, MD Consulting Physician Physical Medicine and Rehabilitation  01/12/21   Lyndal Pulley, DO Consulting Physician Sports Medicine  01/12/21     Chief Complaint  Patient presents with   Back Pain     Subjective: Pt presents for an OV with complaints of significant severe back pain that started on the left lower side this morning at 4 AM.  She states she tried to roll over in bed and could not roll over secondary to pain.  She has not had anything like this in the past. She denies any fevers, chills, nausea.  She does report pain did radiate to her foot, but that has subsided.     08/17/2022    9:09 AM 04/10/2022   11:13 AM 01/08/2022    3:05 PM 01/03/2022    1:38 PM 10/24/2021   10:54 AM  Depression screen PHQ 2/9  Decreased Interest 0 1 0 0 1  Down, Depressed, Hopeless 0 1 1 0 0  PHQ - 2 Score 0 2 1 0 1  Altered sleeping 0 3   3  Tired, decreased energy 0 3   2  Change in appetite 0 0   2  Feeling bad or failure about yourself  0 0   0  Trouble concentrating 0 1   1  Moving slowly or fidgety/restless 0 1   0  Suicidal thoughts 0 0   0  PHQ-9 Score 0 10   9  Difficult doing work/chores Not difficult at all        Allergies  Allergen Reactions   Nutrasweet Aspartame [Aspartame] Diarrhea   Lisinopril Cough   Social History   Social History Narrative   Retired. Lives alone.     Attended some business college.    Former smoker.    Smoke alarm in the home. Wears seat balt.    Wears dentures.    Feels safe in her relationships.       Past Medical History:  Diagnosis Date   Arthritis    Asthma    a little?, no problems in several years   Bladder cancer [facility])    Bladder tumor    Cervical cancer screening 01/31/2012   Chicken pox as a child   Degenerative tear of acetabular labrum of left hip 02/26/2019   Dehydration 01/19/2020   Depression with anxiety 06/07/2009   Qualifier: Diagnosis of  By: [name] MD, Catherine     Dermatitis of external ear 07/11/2012   Diverticulosis    Essential hypertension, benign 10/13/2007   Qualifier: Diagnosis of  By: [name] MD, Romana Juniper cyst 02/26/2019   Right elbow   Hiatal hernia with gastroesophageal reflux 10/26/2010   Qualifier: Diagnosis of  By: [name] MD, Catherine     Hyperglycemia 06/21/2013   Hyperlipidemia, mixed 10/16/2015   pt unaware   Hypertension    Left hip  pain 07/10/2015   Lesion of breast 12/03/2016   benign; resolved   Low back pain 05/29/2015   Measles as a child   Mumps as a child   Osteopenia 01/03/2017   Overweight (BMI 25.0-29.9) 10/07/2008   Qualifier: Diagnosis of  By: [name] MD, [name]     Palpitations    several years ago, not currently   Pre-diabetes    Psoriasis    ears   RUQ pain 05/29/2015   Spider veins    Bilateral legs   Spinal stenosis    Tailor's bunionette, left 05/03/6733   Umbilical hernia    Urinary frequency 09/28/2011   Vertigo    Wears dentures    Upper,   Past Surgical History:  Procedure Laterality Date   ABDOMINAL SURGERY  1970's   BREAST BIOPSY Right 1980   BREAST EXCISIONAL BIOPSY   BREAST BIOPSY Left 1970   BREAST EXCISIONAL BIOPSY   BREAST CYST ASPIRATION     CATARACT EXTRACTION, BILATERAL     COLONOSCOPY     CYSTECTOMY     abdomen   CYSTOSCOPY W/ RETROGRADES Bilateral 04/27/2019   Procedure: CYSTOSCOPY WITH RETROGRADE PYELOGRAM;   Surgeon: Cleon Gustin, MD;  Location: [facility];  Service: Urology;  Laterality: Bilateral;   EYE SURGERY Bilateral    cataract   INSERTION OF MESH N/A 06/11/2017   Procedure: INSERTION OF MESH;  Surgeon: Rolm Bookbinder, MD;  Location: [place];  Service: General;  Laterality: N/A;  BILATERAL TAP BLOCK   IR REMOVAL TUN ACCESS W/ PORT W/O FL MOD SED  11/09/2020   TRANSURETHRAL RESECTION OF BLADDER TUMOR N/A 04/27/2019   Procedure: TRANSURETHRAL RESECTION OF BLADDER TUMOR (TURBT);  Surgeon: Cleon Gustin, MD;  Location: [facility];  Service: Urology;  Laterality: N/A;  1 HR   TRANSURETHRAL RESECTION OF BLADDER TUMOR N/A 06/01/2019   Procedure: TRANSURETHRAL RESECTION OF BLADDER TUMOR (TURBT);  Surgeon: Cleon Gustin, MD;  Location: [facility];  Service: Urology;  Laterality: N/A;  1 HR   TRANSURETHRAL RESECTION OF BLADDER TUMOR N/A 10/22/2019   Procedure: TRANSURETHRAL RESECTION OF BLADDER TUMOR (TURBT);  Surgeon: Cleon Gustin, MD;  Location: [facility];  Service: Urology;  Laterality: N/A;   TUBAL LIGATION  1970   VENTRAL HERNIA REPAIR N/A 06/11/2017   Procedure: LAPAROSCOPIC VENTRAL HERNIA REPAIR WITH MESH ERAS PATHWAY;  Surgeon: Rolm Bookbinder, MD;  Location: [place];  Service: General;  Laterality: N/A;  BILATERAL TAP BLOCK   Family History  Problem Relation Age of Onset   Hypertension Mother    Anxiety disorder Mother    Ovarian cancer Mother 27       lung- smoker, ovarian   Lung cancer Mother        smoker   Arthritis Mother    Hyperlipidemia Mother    Alcohol abuse Father    Diabetes Sister    Hypertension Sister    Ovarian cancer Sister    Arthritis Sister    Depression Sister    Hyperlipidemia Sister    Asthma Sister    Depression Sister    Hyperlipidemia Sister    Breast cancer Sister    Asthma Sister    Depression Sister    Arthritis Brother    COPD Brother    Heart attack  Brother    Hyperlipidemia Brother    Stroke Brother    Prostate cancer Brother    Other Maternal Grandmother  pacemaker   Multiple sclerosis Maternal Grandfather        ?   Asthma Paternal Uncle    Allergies as of 09/07/2022       Reactions   Nutrasweet Aspartame [aspartame] Diarrhea   Lisinopril Cough        Medication List        Accurate as of September 07, 2022  4:29 PM. If you have any questions, ask your nurse or doctor.          STOP taking these medications    benzonatate 200 MG capsule Commonly known as: TESSALON Stopped by: Howard Pouch, DO       TAKE these medications    cyclobenzaprine 5 MG tablet Commonly known as: FLEXERIL Take 1 tablet (5 mg total) by mouth 3 (three) times daily as needed for muscle spasms. Started by: Howard Pouch, DO   KLOR-CON PO Take 40 mEq by mouth daily.   LORazepam 0.5 MG tablet Commonly known as: ATIVAN Take 1 tablet (0.5 mg total) by mouth 2 (two) times daily as needed for anxiety.   magnesium oxide 400 MG tablet Commonly known as: MAG-OX Take by mouth daily.   PARoxetine 20 MG tablet Commonly known as: PAXIL Take 1 tablet (20 mg total) by mouth daily.   predniSONE 20 MG tablet Commonly known as: DELTASONE 60 mg x3d, 40 mg x3d, 20 mg x2d, 10 mg x2d Started by: Howard Pouch, DO   rivaroxaban 10 MG Tabs tablet Commonly known as: XARELTO Take 1 tablet (10 mg total) by mouth daily.   VITAMIN D PO Take 1,000 Int'l Units/1.21m by mouth.        All past medical history, surgical history, allergies, family history, immunizations andmedications were updated in the EMR today and reviewed under the history and medication portions of their EMR.     ROS Negative, with the exception of above mentioned in HPI   Objective:  BP 119/72   Pulse 86   Temp 98.1 F (36.7 C)   Wt 170 lb 6.4 oz (77.3 kg)   SpO2 96%   BMI 28.36 kg/m  Body mass index is 28.36 kg/m. Physical Exam Vitals and nursing note  reviewed.  Constitutional:      General: She is not in acute distress.    Appearance: Normal appearance. She is normal weight. She is not ill-appearing or toxic-appearing.  HENT:     Head: Normocephalic and atraumatic.  Eyes:     General: No scleral icterus.       Right eye: No discharge.        Left eye: No discharge.     Extraocular Movements: Extraocular movements intact.     Conjunctiva/sclera: Conjunctivae normal.     Pupils: Pupils are equal, round, and reactive to light.  Musculoskeletal:     Comments: Low back: Tender to palpation over left SI.  Mild lumbar ropiness on the left.  Neurovascularly intact distally.  Skin:    Findings: No rash.  Neurological:     Mental Status: She is alert and oriented to person, place, and time. Mental status is at baseline.     Motor: No weakness.     Coordination: Coordination normal.     Gait: Gait normal.  Psychiatric:        Mood and Affect: Mood normal.        Behavior: Behavior normal.        Thought Content: Thought content normal.        Judgment: Judgment  normal.      No results found. No results found. No results found for this or any previous visit (from the past 24 hour(s)).  Assessment/Plan: Christina [name] is a 82 y.o. female present for OV for  History of bladder cancer/fatigue - POCT Urinalysis Dipstick (Automated) - Urinalysis w microscopic + reflex cultur Sent urine for culture considering she has had symptoms of fatigue and feeling off over the last week.  Her acute pain today seems to be muscle skeletal in nature.  Sacroiliac pain/Acute low back pain without sciatica, unspecified back pain laterality Her acute pain seems to be muscle skeletal in nature today.  She is tender over the left SI and points directly to the left SI as her area of pain.  Seems she might had some mild sciatica initially as well, but those symptoms seem to have resolved throughout the course of the day. She endorses having mild  improvement throughout the day but she is still moderately uncomfortable. Toradol 30 mg IM today Start Flexeril 3 times daily as needed.  Caution on sedation.  At least take nightly for the next week. Prednisone taper prescribed   Reviewed expectations re: course of current medical issues. Discussed self-management of symptoms. Outlined signs and symptoms indicating need for more acute intervention. Patient verbalized understanding and all questions were answered. Patient received an After-Visit Summary.    Orders Placed This Encounter  Procedures   Urinalysis w microscopic + reflex cultur   POCT Urinalysis Dipstick (Automated)   Meds ordered this encounter  Medications   cyclobenzaprine (FLEXERIL) 5 MG tablet    Sig: Take 1 tablet (5 mg total) by mouth 3 (three) times daily as needed for muscle spasms.    Dispense:  90 tablet    Refill:  0   predniSONE (DELTASONE) 20 MG tablet    Sig: 60 mg x3d, 40 mg x3d, 20 mg x2d, 10 mg x2d    Dispense:  18 tablet    Refill:  0   ketorolac (TORADOL) 30 MG/ML injection 30 mg   Referral Orders  No referral(s) requested today     Note is dictated utilizing voice recognition software. Although note has been proof read prior to signing, occasional typographical errors still can be missed. If any questions arise, please do not hesitate to call for verification.   electronically signed by:  Howard Pouch, DO  [place]

## 2022-09-07 NOTE — Patient Instructions (Signed)

## 2022-09-10 ENCOUNTER — Telehealth: Payer: Self-pay | Admitting: Family Medicine

## 2022-09-10 LAB — URINE CULTURE
MICRO NUMBER:: 14427658
SPECIMEN QUALITY:: ADEQUATE

## 2022-09-10 LAB — URINALYSIS W MICROSCOPIC + REFLEX CULTURE
Bilirubin Urine: NEGATIVE
Glucose, UA: NEGATIVE
Ketones, ur: NEGATIVE
Nitrites, Initial: POSITIVE — AB
Specific Gravity, Urine: 1.012 (ref 1.001–1.035)
Squamous Epithelial / HPF: NONE SEEN /HPF (ref <=5)
pH: 7 (ref 5.0–8.0)

## 2022-09-10 LAB — CULTURE INDICATED

## 2022-09-10 MED ORDER — CEFDINIR 300 MG PO CAPS: 300.0000 mg | ORAL_CAPSULE | Freq: Two times a day (BID) | ORAL | 0 refills | Status: DC

## 2022-09-10 NOTE — Telephone Encounter (Signed)
Spoke with patient regarding results/recommendations.  

## 2022-09-10 NOTE — Telephone Encounter (Signed)
Please inform patient: Urine culture did show evidence of a bacterial infection.  I have called in an antibiotic called Omnicef which is every 12 hours.  I called in 10 days worth for her since she has a ostomy bag, potentially making this a little bit more complicated to treat. Urine cultures do show this particular antibiotic will treat appropriately.

## 2022-10-03 ENCOUNTER — Encounter: Payer: Self-pay | Admitting: Hematology & Oncology

## 2022-10-03 ENCOUNTER — Inpatient Hospital Stay: Payer: Medicare PPO | Admitting: Hematology & Oncology

## 2022-10-03 ENCOUNTER — Telehealth: Payer: Self-pay | Admitting: *Deleted

## 2022-10-03 ENCOUNTER — Inpatient Hospital Stay: Payer: Medicare PPO | Attending: Hematology & Oncology

## 2022-10-03 VITALS — BP 127/56 | HR 85 | Temp 98.2°F | Resp 17 | Ht 65.0 in | Wt 171.0 lb

## 2022-10-03 DIAGNOSIS — Z8551 Personal history of malignant neoplasm of bladder: Secondary | ICD-10-CM | POA: Diagnosis not present

## 2022-10-03 DIAGNOSIS — Z9221 Personal history of antineoplastic chemotherapy: Secondary | ICD-10-CM | POA: Diagnosis not present

## 2022-10-03 DIAGNOSIS — Z79811 Long term (current) use of aromatase inhibitors: Secondary | ICD-10-CM | POA: Diagnosis not present

## 2022-10-03 DIAGNOSIS — C8240 Follicular lymphoma grade IIIb, unspecified site: Secondary | ICD-10-CM

## 2022-10-03 DIAGNOSIS — C679 Malignant neoplasm of bladder, unspecified: Secondary | ICD-10-CM | POA: Insufficient documentation

## 2022-10-03 DIAGNOSIS — Z7901 Long term (current) use of anticoagulants: Secondary | ICD-10-CM | POA: Insufficient documentation

## 2022-10-03 DIAGNOSIS — Z79899 Other long term (current) drug therapy: Secondary | ICD-10-CM | POA: Insufficient documentation

## 2022-10-03 DIAGNOSIS — M7989 Other specified soft tissue disorders: Secondary | ICD-10-CM

## 2022-10-03 LAB — CMP (CANCER CENTER ONLY)
ALT: 8 U/L (ref 0–44)
AST: 17 U/L (ref 15–41)
Albumin: 4 g/dL (ref 3.5–5.0)
Alkaline Phosphatase: 72 U/L (ref 38–126)
Anion gap: 9 (ref 5–15)
BUN: 16 mg/dL (ref 8–23)
CO2: 24 mmol/L (ref 22–32)
Calcium: 10.9 mg/dL — ABNORMAL HIGH (ref 8.9–10.3)
Chloride: 105 mmol/L (ref 98–111)
Creatinine: 1.2 mg/dL — ABNORMAL HIGH (ref 0.44–1.00)
GFR, Estimated: 46 mL/min — ABNORMAL LOW (ref >60)
Glucose, Bld: 161 mg/dL — ABNORMAL HIGH (ref 70–99)
Potassium: 4.1 mmol/L (ref 3.5–5.1)
Sodium: 138 mmol/L (ref 135–145)
Total Bilirubin: 1.2 mg/dL (ref 0.3–1.2)
Total Protein: 6.9 g/dL (ref 6.5–8.1)

## 2022-10-03 LAB — CBC WITH DIFFERENTIAL (CANCER CENTER ONLY)
Abs Immature Granulocytes: 0.01 10*3/uL (ref 0.00–0.07)
Basophils Absolute: 0 10*3/uL (ref 0.0–0.1)
Basophils Relative: 1 %
Eosinophils Absolute: 0.1 10*3/uL (ref 0.0–0.5)
Eosinophils Relative: 3 %
HCT: 40.3 % (ref 36.0–46.0)
Hemoglobin: 12.9 g/dL (ref 12.0–15.0)
Immature Granulocytes: 0 %
Lymphocytes Relative: 38 %
Lymphs Abs: 1.7 10*3/uL (ref 0.7–4.0)
MCH: 31.7 pg (ref 26.0–34.0)
MCHC: 32 g/dL (ref 30.0–36.0)
MCV: 99 fL (ref 80.0–100.0)
Monocytes Absolute: 0.4 10*3/uL (ref 0.1–1.0)
Monocytes Relative: 9 %
Neutro Abs: 2.2 10*3/uL (ref 1.7–7.7)
Neutrophils Relative %: 49 %
Platelet Count: 200 10*3/uL (ref 150–400)
RBC: 4.07 MIL/uL (ref 3.87–5.11)
RDW: 14.3 % (ref 11.5–15.5)
WBC Count: 4.4 10*3/uL (ref 4.0–10.5)
nRBC: 0 % (ref 0.0–0.2)

## 2022-10-03 LAB — RETICULOCYTES
Immature Retic Fract: 11.6 % (ref 2.3–15.9)
RBC.: 4.1 MIL/uL (ref 3.87–5.11)
Retic Count, Absolute: 78.3 10*3/uL (ref 19.0–186.0)
Retic Ct Pct: 1.9 % (ref 0.4–3.1)

## 2022-10-03 LAB — FERRITIN: Ferritin: 34 ng/mL (ref 11–307)

## 2022-10-03 LAB — LACTATE DEHYDROGENASE: LDH: 143 U/L (ref 98–192)

## 2022-10-03 NOTE — Telephone Encounter (Signed)
Patient here to see dr Marin Olp.  Requested we order her Urostomy supplies.  Telephone number is [phone].  Was told by receptionist her order is not due till March 4th but gave reference numbers for her supplies.  She receives 3 items.  2 piece urostomy pouches - [id number] by [employer]. 2) AKA wafer barriers - [id number] also by [employer].  3)  2 2000 ml drain bage [id number].  Called patient to let her know she is not due yet but to call us when she needs them and we will give physician order.  Patient appreciates call

## 2022-10-03 NOTE — Progress Notes (Signed)
Hematology and Oncology Follow Up Visit  Christina [name] [id number] [birth year] 82 y.o. 10/03/2022   Principle Diagnosis:  Stage IIIB ([id number]) invasive urothelial carcinoma of the bladder  Subsegmental blood clot in right lower lung  Current Therapy:   Status post neoadjuvant chemotherapy with ddMVAC Radical cystectomy on 03/03/2020  Xarelto 20 mg p.o. daily-6 months of therapy to start on 06/30/2021 -  changed to Xarelto 10 mg po q day on 12/26/2021     Interim History:  Christina [name] is back for follow-up.  We last saw her back in December.  She has a lot of concerns.  Thankfully, the concerns really are not related to always see her for.  She needs somebody to do her urostomy supplies.  I told her that Urology really does all of this.  She unfortunately does not see a Dealer any longer.  I told her that the Urologist at [facility] can always refer her to a Urologist in [place].  Hopefully, she will call Do to see about making a referral.  For right now, we will make the request for supplies for her urostomy.  She is always worried about her cancer coming back.  She had a CAT scan that was done in December which looked fine.  Will do another 1 in the Spring.  She is tired all the time.  I am unsure as to why she is tired all the time.  Her labs do not look all that bad.  She is not really anemic.  She is on Xarelto to help with the pulmonary emboli.  She had a follow-up CT angiogram about a year ago which did not show any active pulmonary emboli.  She has had no bleeding.  She has had no change in bowel habits.  She does not have diarrhea.  There is no pain issues.  She does get around with a cane.  Currently, I would have said that her performance status is probably ECOG 2.      Medications:  Current Outpatient Medications:    LORazepam (ATIVAN) 0.5 MG tablet, Take 1 tablet (0.5 mg total) by mouth 2 (two) times daily as needed for anxiety., Disp: 60 tablet, Rfl: 5   magnesium oxide  (MAG-OX) 400 MG tablet, Take by mouth daily., Disp: , Rfl:    PARoxetine (PAXIL) 20 MG tablet, Take 1 tablet (20 mg total) by mouth daily., Disp: 90 tablet, Rfl: 1   Potassium Chloride (KLOR-CON PO), Take 40 mEq by mouth daily., Disp: , Rfl:    rivaroxaban (XARELTO) 10 MG TABS tablet, Take 1 tablet (10 mg total) by mouth daily., Disp: 90 tablet, Rfl: 3   VITAMIN D PO, Take 1,000 Int'l Units/1.56m by mouth., Disp: , Rfl:   Allergies:  Allergies  Allergen Reactions   Nutrasweet Aspartame [Aspartame] Diarrhea   Lisinopril Cough    Past Medical History, Surgical history, Social history, and Family History were reviewed and updated.  Review of Systems: Review of Systems  Constitutional: Negative.   HENT:  Negative.    Eyes: Negative.   Respiratory: Negative.    Cardiovascular: Negative.   Gastrointestinal: Negative.   Endocrine: Negative.   Genitourinary: Negative.    Musculoskeletal: Negative.   Skin: Negative.   Neurological: Negative.   Hematological: Negative.   Psychiatric/Behavioral: Negative.      Physical Exam:  height is '5\' 5"'$  (1.651 m) and weight is 171 lb (77.6 kg). Her oral temperature is 98.2 F (36.8 C). Her blood pressure is 127/56 (abnormal) and  her pulse is 85. Her respiration is 17 and oxygen saturation is 97%.   Wt Readings from Last 3 Encounters:  10/03/22 171 lb (77.6 kg)  09/07/22 170 lb 6.4 oz (77.3 kg)  08/17/22 169 lb 2 oz (76.7 kg)    Physical Exam Vitals reviewed.  HENT:     Head: Normocephalic and atraumatic.  Eyes:     Pupils: Pupils are equal, round, and reactive to light.  Cardiovascular:     Rate and Rhythm: Normal rate and regular rhythm.     Heart sounds: Normal heart sounds.  Pulmonary:     Effort: Pulmonary effort is normal.     Breath sounds: Normal breath sounds.  Abdominal:     General: Bowel sounds are normal.     Palpations: Abdomen is soft.     Comments: Abdominal exam shows the healed laparotomy scar in the midline.  She  has some firmness in the middle of the laparotomy scar.  She has the urostomy bag which is well-positioned.  Urine is clear.  There is no fluid wave in the abdomen.  Bowel sounds are present.  She has no palpable liver or spleen tip.  Musculoskeletal:        General: No tenderness or deformity. Normal range of motion.     Cervical back: Normal range of motion.  Lymphadenopathy:     Cervical: No cervical adenopathy.  Skin:    General: Skin is warm and dry.     Findings: No erythema or rash.  Neurological:     Mental Status: She is alert and oriented to person, place, and time.  Psychiatric:        Behavior: Behavior normal.        Thought Content: Thought content normal.        Judgment: Judgment normal.    Lab Results  Component Value Date   WBC 4.4 10/03/2022   HGB 12.9 10/03/2022   HCT 40.3 10/03/2022   MCV 99.0 10/03/2022   PLT 200 10/03/2022     Chemistry      Component Value Date/Time   NA 139 07/30/2022 1057   NA 142 03/04/2017 1016   K 3.8 07/30/2022 1057   CL 105 07/30/2022 1057   CO2 28 07/30/2022 1057   BUN 15 07/30/2022 1057   BUN 13 03/04/2017 1016   CREATININE 1.07 (H) 07/30/2022 1057   CREATININE 0.78 05/20/2015 1704      Component Value Date/Time   CALCIUM 10.3 07/30/2022 1057   ALKPHOS 72 07/30/2022 1057   AST 16 07/30/2022 1057   ALT 8 07/30/2022 1057   BILITOT 0.8 07/30/2022 1057      Impression and Plan:  Christina [name] is a 82 year old white female.  She had superficial bladder cancer.  She was treated with intravesicular therapy.  However, she has had had muscle invasive cancer which was poorly differentiated.  She then underwent neoadjuvant chemotherapy followed by radical cystectomy.  She is found to have stage IIIB disease.  Again, I told her that I do not see any evidence of bladder cancer.  She thinks that this is how she will pass on.  I really hope that this is not the case.  We will repeat a CT scan in April or May.  Hopefully, she will  get into see a Urologist locally.  I think this will certainly help her out.  She will continue on the Xarelto for right now.  I will see her back and we will do the  CAT scan the same day that I see her.   Volanda Napoleon, MD 2/7/202412:11 PM

## 2022-10-04 LAB — IRON AND IRON BINDING CAPACITY (CC-WL,HP ONLY)
Iron: 150 ug/dL (ref 28–170)
Saturation Ratios: 48 % — ABNORMAL HIGH (ref 10.4–31.8)
TIBC: 314 ug/dL (ref 250–450)
UIBC: 164 ug/dL (ref 148–442)

## 2022-10-05 ENCOUNTER — Encounter: Payer: Self-pay | Admitting: *Deleted

## 2022-10-05 DIAGNOSIS — Z8551 Personal history of malignant neoplasm of bladder: Secondary | ICD-10-CM

## 2022-10-05 NOTE — Progress Notes (Signed)
Patient has not seen her urologist at [facility] for sometime. She needs to continue care with a urologist and is requesting to see one in [place]. She attempted to call the old office at [facility] however she is unable to get in touch with anyone.   We will place an order for urology in [place].   Oncology Nurse Navigator Documentation     10/05/2022    1:30 PM  Oncology Nurse Navigator Flowsheets  Navigator Location [facility]  Navigator Encounter Type Telephone  Patient Visit Type MedOnc  Treatment Phase Post-Tx Follow-up  Barriers/Navigation Needs Coordination of Care  Interventions Referrals  Acuity Level 1-No Barriers  Referrals Other  Support Groups/Services Friends and Family  Time Spent with Patient 15

## 2022-10-08 ENCOUNTER — Telehealth: Payer: Self-pay | Admitting: *Deleted

## 2022-10-08 NOTE — Telephone Encounter (Signed)
Referral faxed to [facility] @ [phone]

## 2022-10-22 ENCOUNTER — Other Ambulatory Visit: Payer: Self-pay | Admitting: Family Medicine

## 2022-10-24 IMAGING — CT CT CHEST-ABD-PELV W/ CM
2 of 5 series · 13 of 36 positions shown, 15 images · IV contrast (Omnipaque)
Comparison: 03/24/2021

CLINICAL DATA: Bladder carcinoma status post cystectomy,
chemotherapy

EXAM:
CT CHEST, ABDOMEN, AND PELVIS WITH CONTRAST
TECHNIQUE: Multidetector CT imaging of the chest, abdomen and pelvis was
performed following the standard protocol during bolus
administration of intravenous contrast.
CONTRAST:  100mL OMNIPAQUE IOHEXOL 300 MG/ML  SOLN

[Series 2: cap with 2 · axial · 0.97mm/px · z∈[-613,-73]mm · 10 of 133 slices shown, 12 images]
[im 13/133  mediastinal]
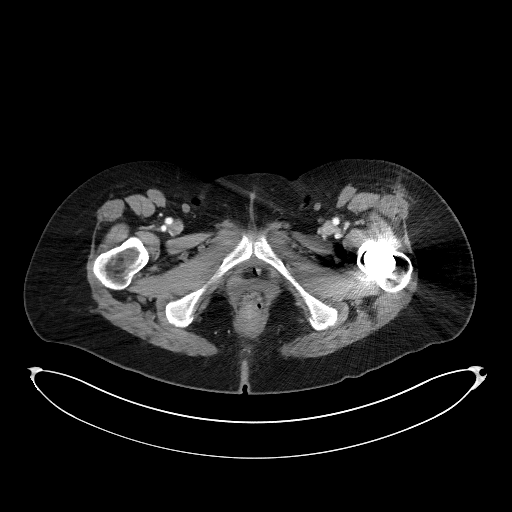
[im 13/133  bone]
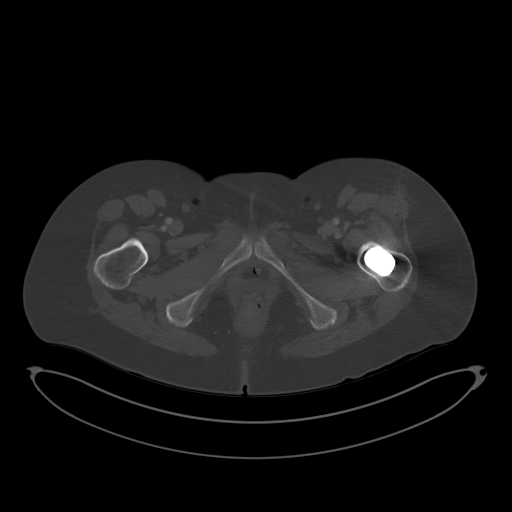
[im 25/133  mediastinal]
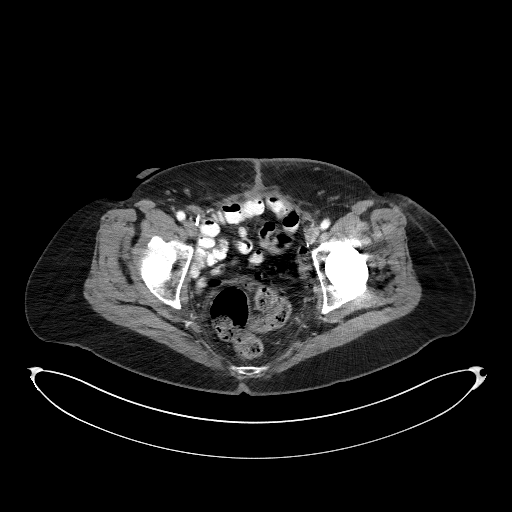
[im 37/133  mediastinal]
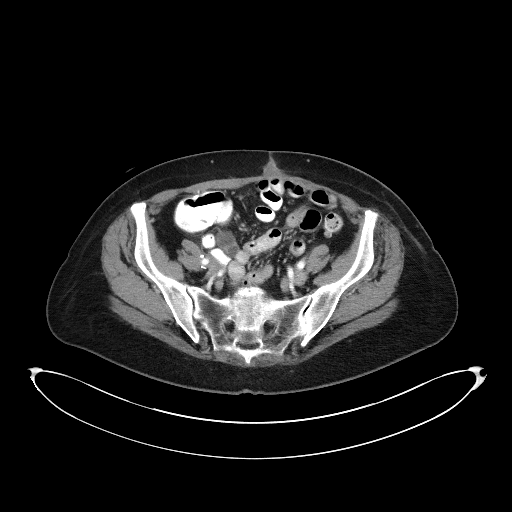
[im 49/133  mediastinal]
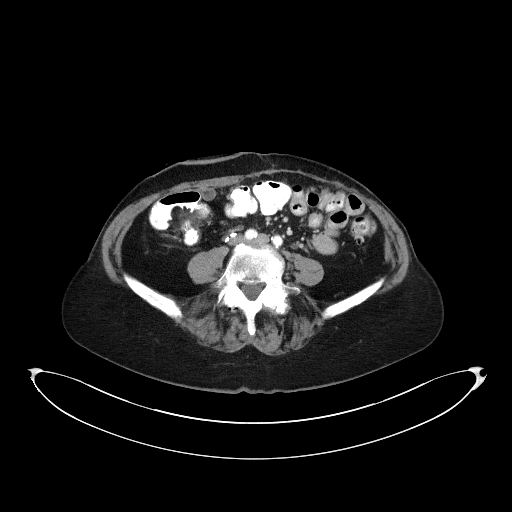
[im 61/133  mediastinal]
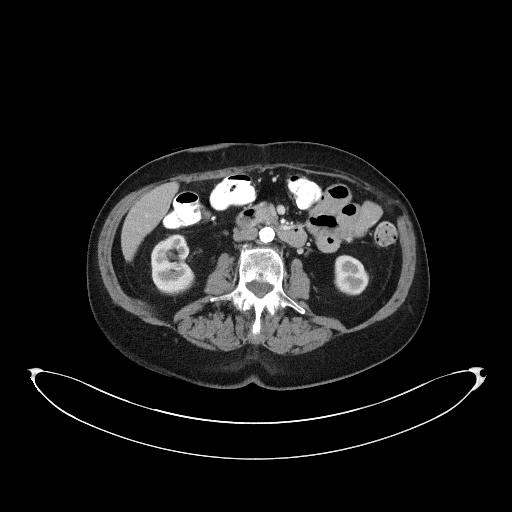
[im 73/133  mediastinal]
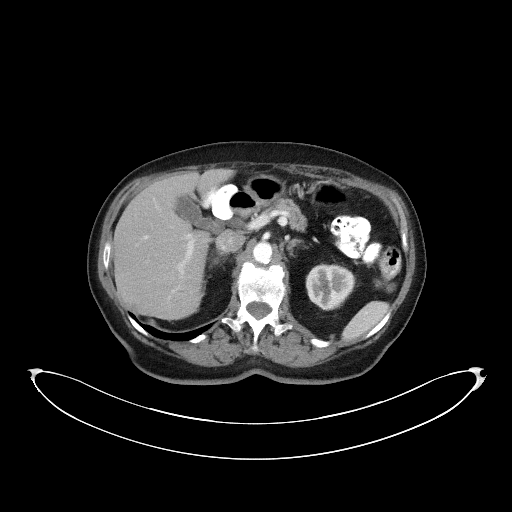
[im 85/133  mediastinal]
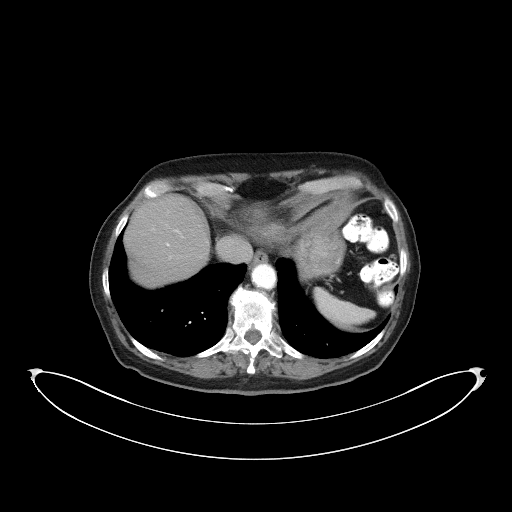
[im 97/133  mediastinal]
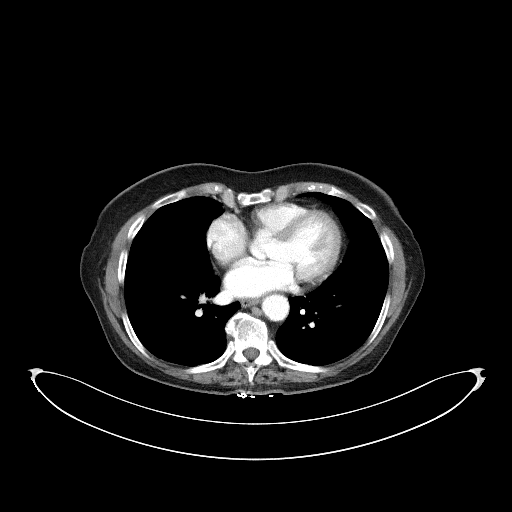
[im 109/133  mediastinal]
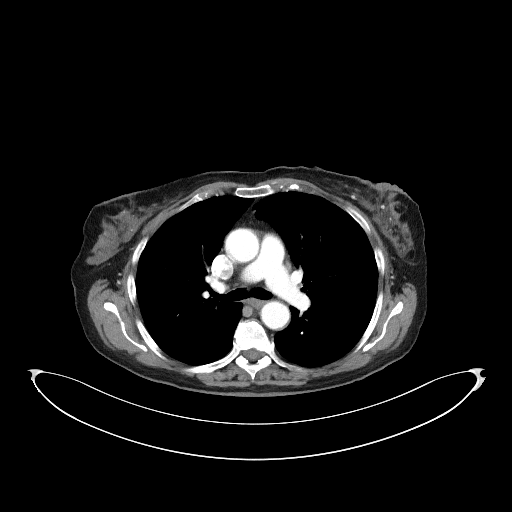
[im 109/133  bone]
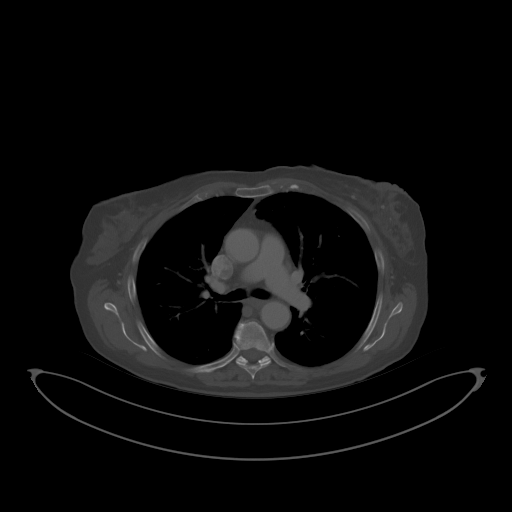
[im 121/133  mediastinal]
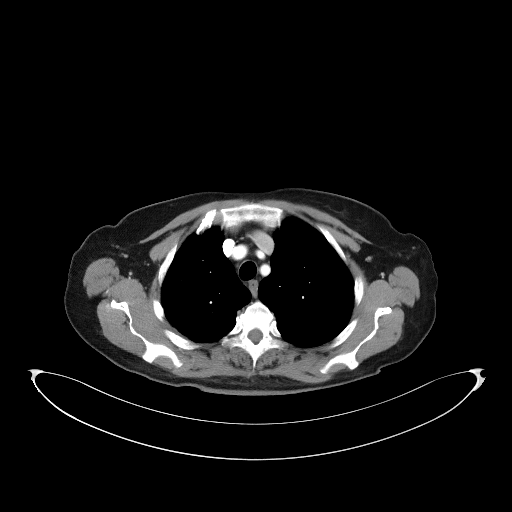

[Series 5: coronals · coronal · 0.87mm/px · 3 of 127 slices shown]
[im 26/127  mediastinal]
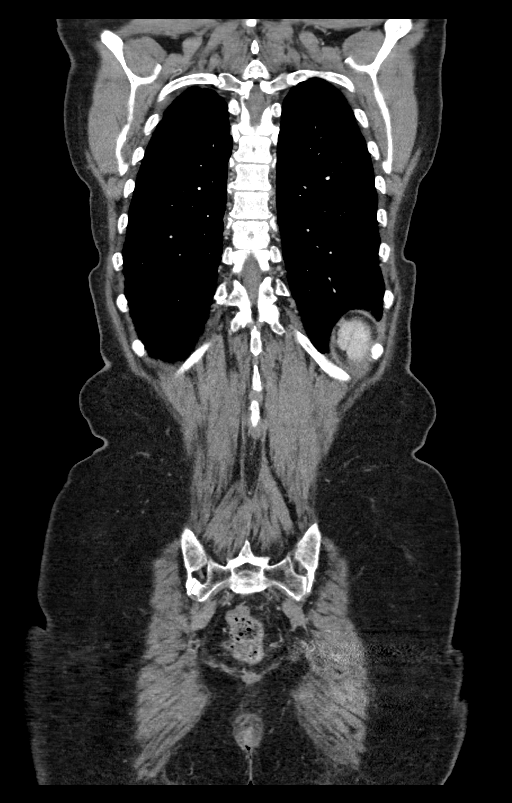
[im 51/127  mediastinal]
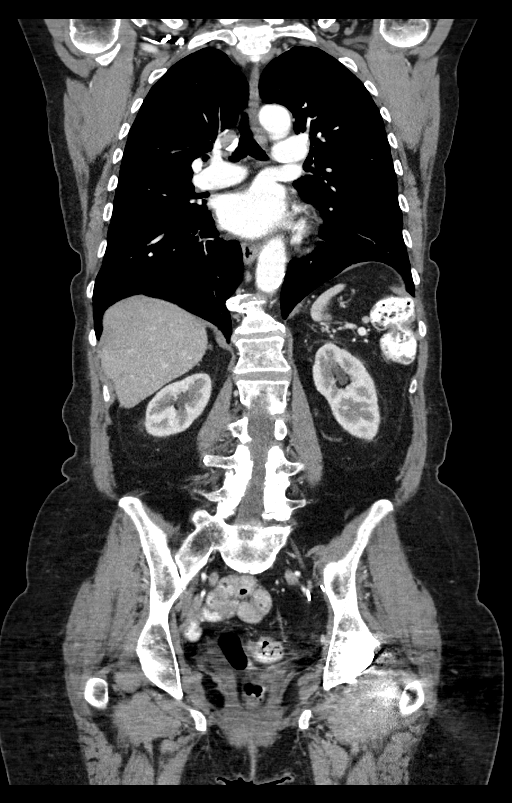
[im 76/127  mediastinal]
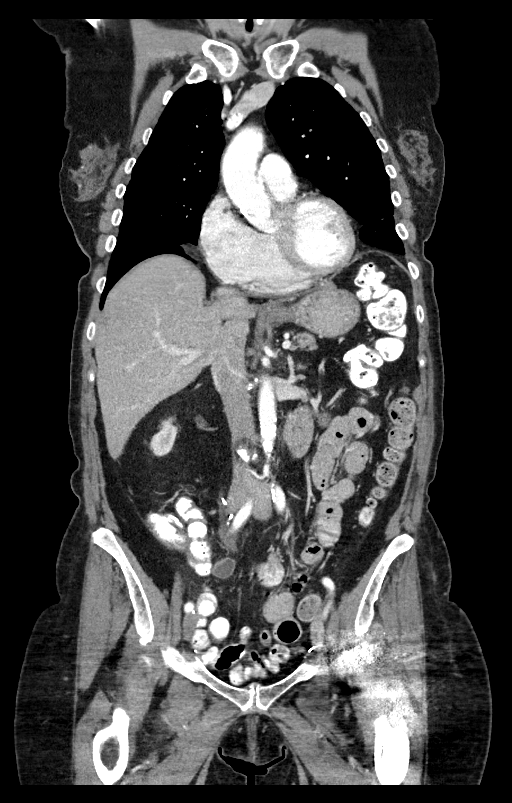

[13 of 36 positions shown; findings below may reference images not displayed]

FINDINGS: CT CHEST FINDINGS

Cardiovascular: Heart size normal. No pericardial effusion.
Nonocclusive filling defect noted in the right lower lobe pulmonary
artery (Im35,Se2) , new since previous. Mild scattered calcified
plaque in the descending thoracic aorta.

Mediastinum/Nodes: No mass or adenopathy.

Lungs/Pleura: No pleural effusion. Stable 4 mm left lower lobe
pulmonary nodule (4mBQ8,Se4) . a few scattered subpleural nodules
measuring 3 mm or less (for example in the posterior right lower
lobe JmCCQ,Se 4), stable. No pneumothorax.

Musculoskeletal: No chest wall mass or suspicious bone lesions
identified.

CT ABDOMEN PELVIS FINDINGS

Hepatobiliary: No focal liver abnormality is seen. No gallstones,
gallbladder wall thickening, or biliary dilatation.

Pancreas: Unremarkable. No pancreatic ductal dilatation or
surrounding inflammatory changes.

Spleen: Normal in size without focal abnormality.

Adrenals/Urinary Tract: Stable right adrenal nodule. Left adrenal
unremarkable. Symmetric renal enhancement without focal lesion or
urolithiasis. No hydronephrosis. Post cystectomy with unremarkable
ileal conduit in the right lower quadrant.

Stomach/Bowel: Stomach is partially distended, unremarkable. Small
bowel decompressed. Postop changes of right lower quadrant ileal
conduit. The colon is nondilated with a few scattered left-sided
diverticula; no adjacent inflammatory/edematous changes.

Vascular/Lymphatic: Scattered aortoiliac calcified plaque without
aneurysm or stenosis. Portal vein patent. No abdominal or pelvic
adenopathy.

Reproductive: Status post hysterectomy. No adnexal masses.

Other: No ascites.  No free air.

Musculoskeletal: Small paraumbilical hernia containing only
mesenteric fat. Left hip arthroplasty without apparent complication.
Spondylitic changes in the lower lumbar spine. No fracture or
worrisome bone lesion.
IMPRESSION: 1. POSITIVE for solitary nonocclusive segmental pulmonary embolus in
the right lower lobe pulmonary artery. Critical Value/emergent
results were called by telephone at the time of interpretation on
06/30/2021 at [DATE] to provider HUSAM AVITIA , who verbally
acknowledged these results.
2. Stable pulmonary nodules 4 mm and smaller. Recommend continued
attention on follow-up.
3. No mass, adenopathy, or other suggestion of metastatic disease.
4. Aortic Atherosclerosis ([id number]-170.0).

## 2022-10-24 IMAGING — US US EXTREM LOW VENOUS
1 series · 13 of 24 positions shown · non-contrast
Comparison: None.

CLINICAL DATA: Acute pulmonary embolus



[Series 1: us extrem low venous · 13 of 78 slices shown]
[im 1/78]
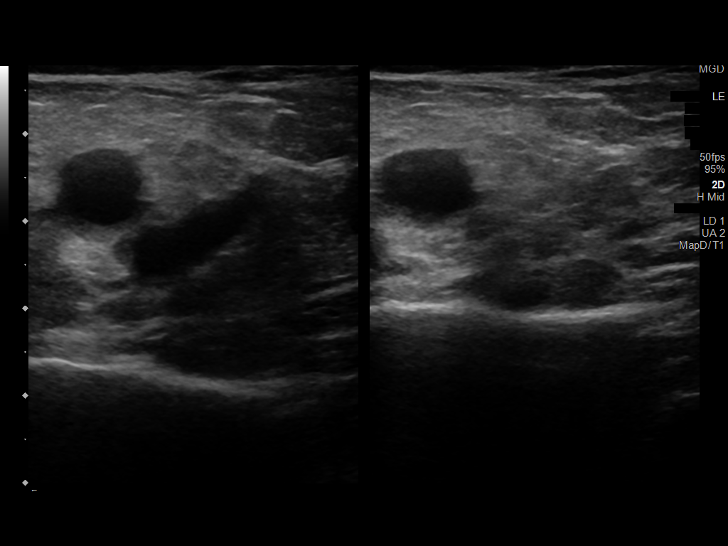
[im 7/78]
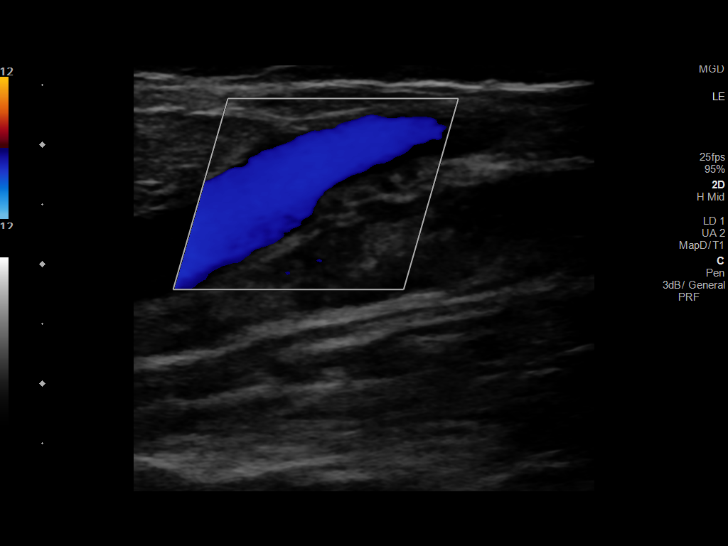
[im 14/78]
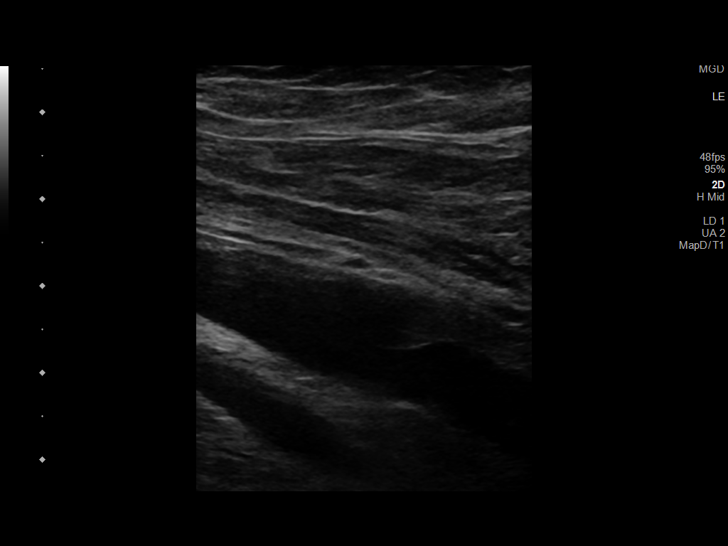
[im 21/78]
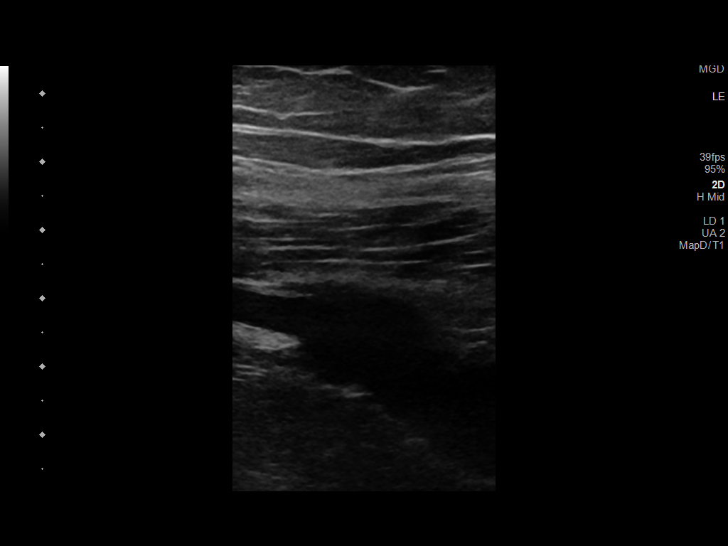
[im 27/78]
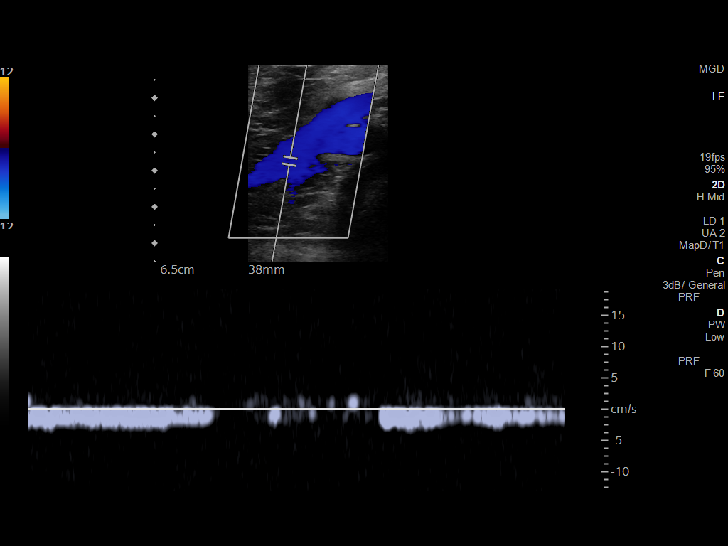
[im 34/78]
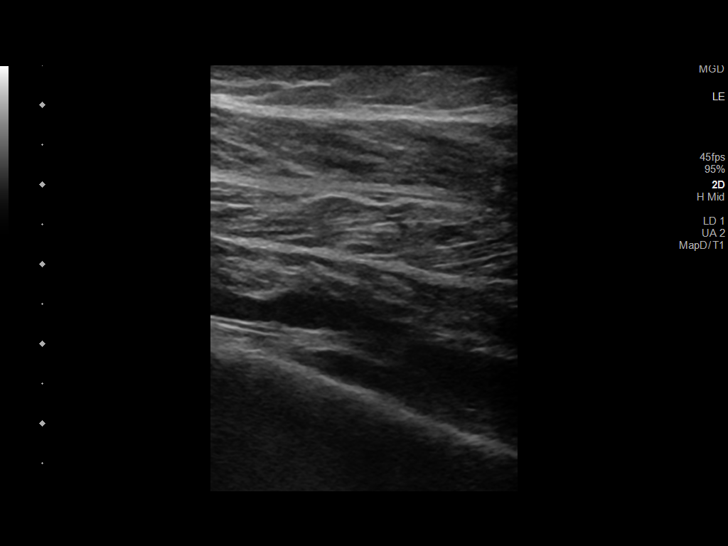
[im 41/78]
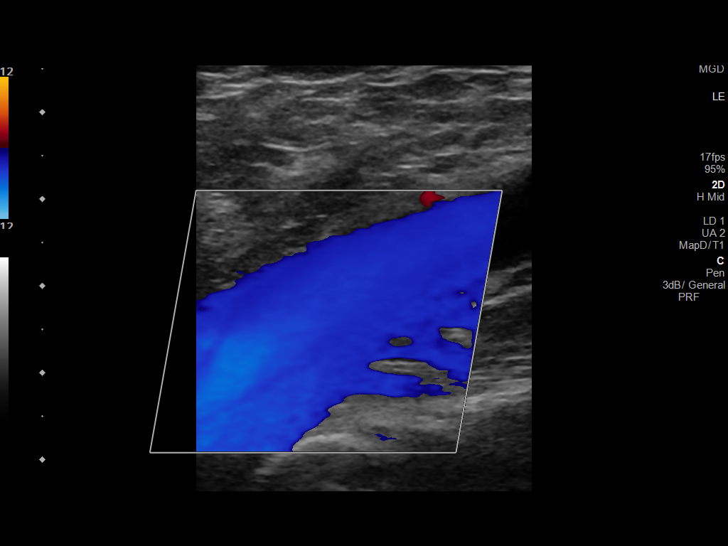
[im 44/78]
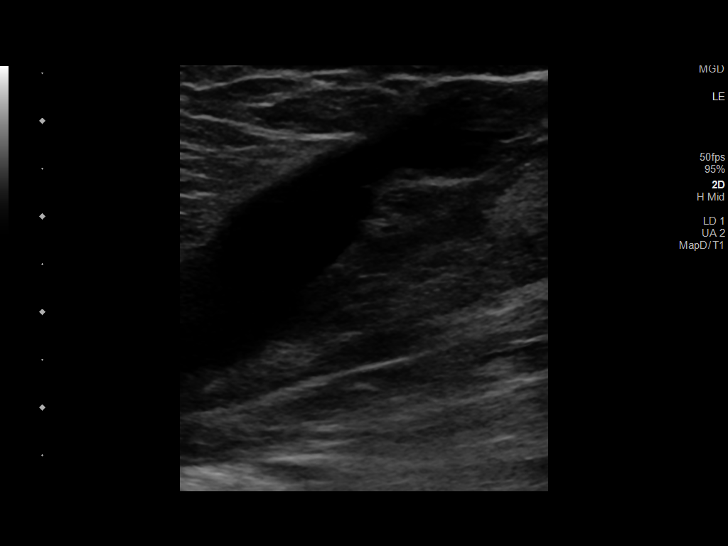
[im 51/78]
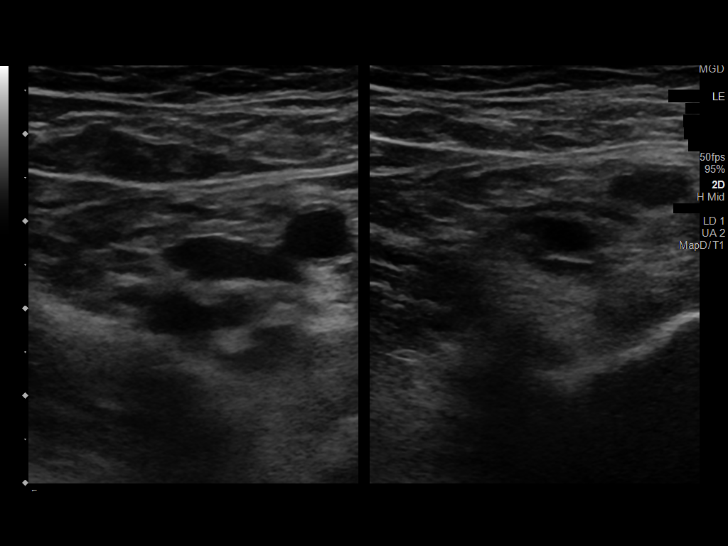
[im 57/78]
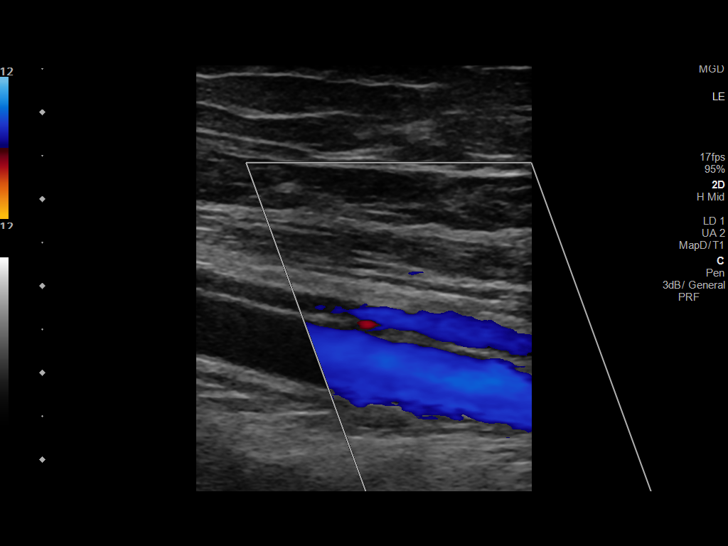
[im 64/78]
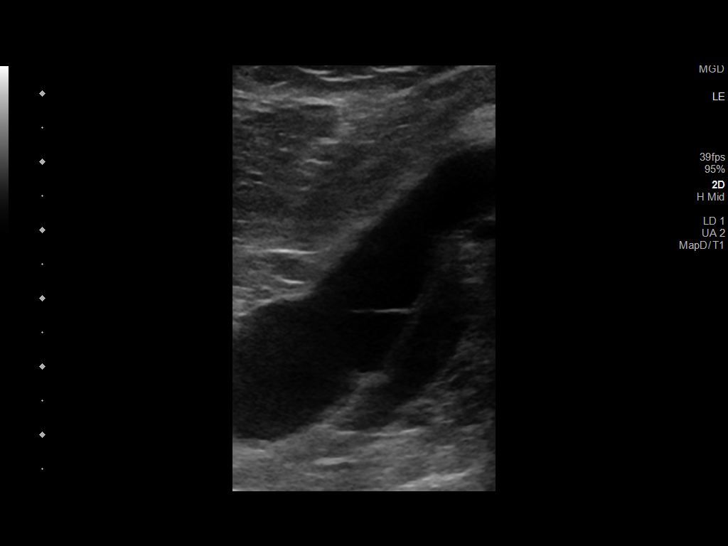
[im 71/78]
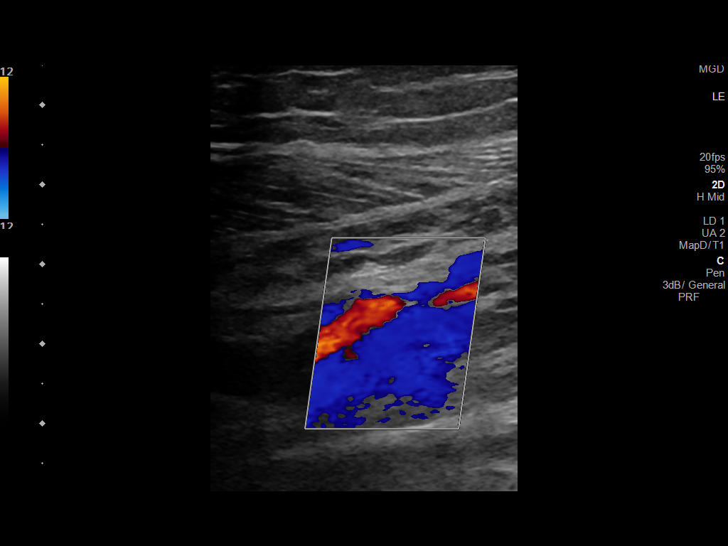
[im 78/78]
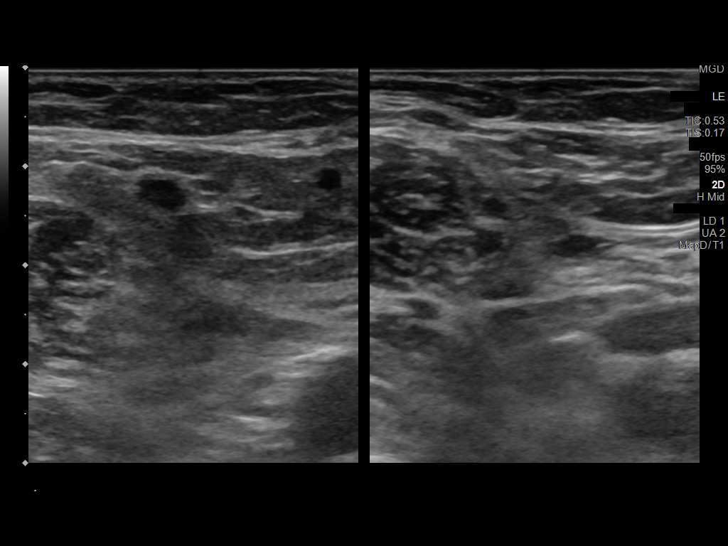

[13 of 24 positions shown; findings below may reference images not displayed]

FINDINGS: RIGHT LOWER EXTREMITY

Common Femoral Vein: No evidence of thrombus. Normal
compressibility, respiratory phasicity and response to augmentation.

Saphenofemoral Junction: No evidence of thrombus. Normal
compressibility and flow on color Doppler imaging.

Profunda Femoral Vein: No evidence of thrombus. Normal
compressibility and flow on color Doppler imaging.

Femoral Vein: No evidence of thrombus. Normal compressibility,
respiratory phasicity and response to augmentation.

Popliteal Vein: No evidence of thrombus. Normal compressibility,
respiratory phasicity and response to augmentation.

Calf Veins: No evidence of thrombus. Normal compressibility and flow
on color Doppler imaging.

Superficial Great Saphenous Vein: No evidence of thrombus. Normal
compressibility.

LEFT LOWER EXTREMITY

Common Femoral Vein: No evidence of thrombus. Normal
compressibility, respiratory phasicity and response to augmentation.

Saphenofemoral Junction: No evidence of thrombus. Normal
compressibility and flow on color Doppler imaging.

Profunda Femoral Vein: No evidence of thrombus. Normal
compressibility and flow on color Doppler imaging.

Femoral Vein: No evidence of thrombus. Normal compressibility,
respiratory phasicity and response to augmentation.

Popliteal Vein: No evidence of thrombus. Normal compressibility,
respiratory phasicity and response to augmentation.

Calf Veins: No evidence of thrombus. Normal compressibility and flow
on color Doppler imaging.

Superficial Great Saphenous Vein: No evidence of thrombus. Normal
compressibility.
IMPRESSION: No evidence of deep venous thrombosis in either lower extremity.

## 2022-10-30 ENCOUNTER — Encounter: Payer: Medicare PPO | Admitting: Family Medicine

## 2022-10-30 ENCOUNTER — Ambulatory Visit (INDEPENDENT_AMBULATORY_CARE_PROVIDER_SITE_OTHER): Payer: Medicare PPO | Admitting: Family Medicine

## 2022-10-30 ENCOUNTER — Encounter: Payer: Self-pay | Admitting: Family Medicine

## 2022-10-30 VITALS — BP 128/69 | HR 78 | Temp 97.9°F | Ht 65.0 in | Wt 174.6 lb

## 2022-10-30 DIAGNOSIS — M810 Age-related osteoporosis without current pathological fracture: Secondary | ICD-10-CM

## 2022-10-30 DIAGNOSIS — T451X5A Adverse effect of antineoplastic and immunosuppressive drugs, initial encounter: Secondary | ICD-10-CM

## 2022-10-30 DIAGNOSIS — N1831 Chronic kidney disease, stage 3a: Secondary | ICD-10-CM | POA: Diagnosis not present

## 2022-10-30 DIAGNOSIS — E559 Vitamin D deficiency, unspecified: Secondary | ICD-10-CM | POA: Diagnosis not present

## 2022-10-30 DIAGNOSIS — F418 Other specified anxiety disorders: Secondary | ICD-10-CM | POA: Diagnosis not present

## 2022-10-30 DIAGNOSIS — Z13220 Encounter for screening for lipoid disorders: Secondary | ICD-10-CM | POA: Diagnosis not present

## 2022-10-30 DIAGNOSIS — Z0000 Encounter for general adult medical examination without abnormal findings: Secondary | ICD-10-CM | POA: Diagnosis not present

## 2022-10-30 DIAGNOSIS — Z8551 Personal history of malignant neoplasm of bladder: Secondary | ICD-10-CM | POA: Diagnosis not present

## 2022-10-30 DIAGNOSIS — D6481 Anemia due to antineoplastic chemotherapy: Secondary | ICD-10-CM | POA: Diagnosis not present

## 2022-10-30 DIAGNOSIS — Z1231 Encounter for screening mammogram for malignant neoplasm of breast: Secondary | ICD-10-CM

## 2022-10-30 DIAGNOSIS — R7309 Other abnormal glucose: Secondary | ICD-10-CM

## 2022-10-30 LAB — BASIC METABOLIC PANEL
BUN: 16 mg/dL (ref 6–23)
CO2: 25 mEq/L (ref 19–32)
Calcium: 10.3 mg/dL (ref 8.4–10.5)
Chloride: 105 mEq/L (ref 96–112)
Creatinine, Ser: 1.14 mg/dL (ref 0.40–1.20)
GFR: 45.36 mL/min — ABNORMAL LOW (ref >60.00)
Glucose, Bld: 141 mg/dL — ABNORMAL HIGH (ref 70–99)
Potassium: 3.9 mEq/L (ref 3.5–5.1)
Sodium: 140 mEq/L (ref 135–145)

## 2022-10-30 LAB — TSH: TSH: 2 u[IU]/mL (ref 0.35–5.50)

## 2022-10-30 LAB — LIPID PANEL
Cholesterol: 184 mg/dL (ref 0–200)
HDL: 67.1 mg/dL (ref >39.00)
LDL Cholesterol: 98 mg/dL (ref 0–99)
NonHDL: 116.48
Total CHOL/HDL Ratio: 3
Triglycerides: 91 mg/dL (ref 0.0–149.0)
VLDL: 18.2 mg/dL (ref 0.0–40.0)

## 2022-10-30 LAB — HEMOGLOBIN A1C: Hgb A1c MFr Bld: 5.9 % (ref 4.6–6.5)

## 2022-10-30 LAB — VITAMIN D 25 HYDROXY (VIT D DEFICIENCY, FRACTURES): VITD: 28.82 ng/mL — ABNORMAL LOW (ref 30.00–100.00)

## 2022-10-30 MED ORDER — PAROXETINE HCL 20 MG PO TABS: 20.0000 mg | ORAL_TABLET | Freq: Every day | ORAL | 1 refills | Status: DC

## 2022-10-30 MED ORDER — LORAZEPAM 0.5 MG PO TABS: 0.5000 mg | ORAL_TABLET | Freq: Two times a day (BID) | ORAL | 5 refills | Status: DC | PRN

## 2022-10-30 NOTE — Progress Notes (Signed)
Patient ID: Christina [name], female  DOB: 1941-04-15, 82 y.o.   MRN: [id number] Patient Care Team    Relationship Specialty Notifications Start End  Ma Hillock, DO PCP - General Family Medicine  09/02/17   Rolm Bookbinder, MD Consulting Physician General Surgery  10/05/16   Monna Fam, MD Consulting Physician Ophthalmology  10/05/16   Garry Heater, DDS Consulting Physician Dentistry  10/05/16   Juanita Craver, MD Consulting Physician Gastroenterology  09/02/17   Volanda Napoleon, MD Medical Oncologist Oncology  05/14/19   Suella Broad, MD Consulting Physician Physical Medicine and Rehabilitation  01/12/21   Lyndal Pulley, DO Consulting Physician Sports Medicine  01/12/21   Robley Fries, MD Consulting Physician Urology  10/30/22     Chief Complaint  Patient presents with   Annual Exam    Pt is not fastinng; cmc    Subjective: Christina [name] is a 82 y.o. female present for CPE and Chronic Conditions/illness Management  All past medical history, surgical history, allergies, family history, immunizations, medications and social history were updated in the electronic medical record today. All recent labs, ED visits and hospitalizations within the last year were reviewed.  Health maintenance:  Colonoscopy: > 75 n/a Mammogram: Completed 11/23/2021 within normal limits.[email or web address]> ordered 2024 Cervical cancer screening: > 65, N/A Immunizations: Tetanus UTD 09/2021, declined flu shot.  pneumonia series completed.  declined shingrix.   Infectious disease screening: N/A  DEXA: 11/2021  (-2.7) Assistive device: none Oxygen [id number] Patient has a Dental home. Hospitalizations/ED visits: reviewed  Situational anxiety:  Patient report she is feeling well and compliant with ativan 0.5 mg BID prn and paxil 20 mg qd. She rarely uses the ativan- only if she can not sleep. Has filled one 30d script in 6 mos and has some left in bottle.  Prior note: Was started on Paxil by her  oncology team at the onset of diagnosis of bladder cancer. She does feel that it has been helpful along with the Ativan 0.5 mg twice daily. She reports she had a provider tell her that Zoloft is better for her than Paxil and she wanted to try zoloft. Switch was made last appt and she feels paxil was a better fit for her. She does have trouble sleeping some nights. She reports ativan  Mg qhs is usually helpful but sometimes she requires additional coverage.       08/17/2022    9:09 AM 04/10/2022   11:13 AM 01/08/2022    3:05 PM 01/03/2022    1:38 PM 10/24/2021   10:54 AM  Depression screen PHQ 2/9  Decreased Interest 0 1 0 0 1  Down, Depressed, Hopeless 0 1 1 0 0  PHQ - 2 Score 0 2 1 0 1  Altered sleeping 0 3   3  Tired, decreased energy 0 3   2  Change in appetite 0 0   2  Feeling bad or failure about yourself  0 0   0  Trouble concentrating 0 1   1  Moving slowly or fidgety/restless 0 1   0  Suicidal thoughts 0 0   0  PHQ-9 Score 0 10   9  Difficult doing work/chores Not difficult at all          04/10/2022   11:14 AM 10/24/2021   10:54 AM 04/25/2021   11:40 AM 05/30/2020    9:41 AM  GAD 7 : Generalized Anxiety Score  Nervous, Anxious, on Edge 1 0 1  1  Control/stop worrying 1 0 0 1  Worry too much - different things 1 1 0 1  Trouble relaxing 1 0 0 0  Restless 3 1 0 0  Easily annoyed or irritable '1 1 1 '$ 0  Afraid - awful might happen '2 1 1 '$ 0  Total GAD 7 Score '10 4 3 3          '$ 08/17/2022    9:10 AM 01/08/2022    3:06 PM 01/03/2022    1:41 PM 10/24/2021   10:31 AM 12/28/2020    1:40 PM  Fall Risk   Falls in the past year? 0 '1 1 1 '$ 0  Number falls in past yr:  0 1 0 0  Injury with Fall?  0 0 0 0  Risk for fall due to : No Fall Risks   No Fall Risks   Follow up Falls evaluation completed  Falls prevention discussed Falls evaluation completed Falls prevention discussed   Immunization History  Administered Date(s) Administered   Influenza Whole 06/07/2009, 05/28/2011, 05/28/2012    Influenza, High Dose Seasonal PF 05/20/2015   Influenza,inj,Quad PF,6+ Mos 05/26/2013, 05/31/2014   Moderna Sars-Covid-2 Vaccination 12/11/2019, 02/12/2020, 09/27/2020   Pneumococcal Conjugate-13 05/31/2014   Pneumococcal Polysaccharide-23 06/07/2009   Td 01/31/2003   Td (Adult), 2 Lf Tetanus Toxid, Preservative Free 01/31/2003   Tdap 09/28/2011, 10/24/2021   Past Medical History:  Diagnosis Date   Arthritis    Asthma    a little?, no problems in several years   Bladder cancer ([place])    Bladder tumor    Cervical cancer screening 01/31/2012   Chicken pox as a child   Degenerative tear of acetabular labrum of left hip 02/26/2019   Dehydration 01/19/2020   Depression with anxiety 06/07/2009   Qualifier: Diagnosis of  By: [name] MD, Catherine     Dermatitis of external ear 07/11/2012   Diverticulosis    Essential hypertension, benign 10/13/2007   Qualifier: Diagnosis of  By: [name] MD, Catherine     Ganglion cyst 02/26/2019   Right elbow   Hiatal hernia with gastroesophageal reflux 10/26/2010   Qualifier: Diagnosis of  By: [name] MD, Catherine     Hyperglycemia 06/21/2013   Hyperlipidemia, mixed 10/16/2015   pt unaware   Hypertension    Left hip pain 07/10/2015   Lesion of breast 12/03/2016   benign; resolved   Low back pain 05/29/2015   Measles as a child   Mumps as a child   Osteopenia 01/03/2017   Overweight (BMI 25.0-29.9) 10/07/2008   Qualifier: Diagnosis of  By: [name] MD, [name]     Palpitations    several years ago, not currently   Pre-diabetes    Psoriasis    ears   RUQ pain 05/29/2015   Spider veins    Bilateral legs   Spinal stenosis    Tailor's bunionette, left [id number]   Umbilical hernia    Urinary frequency 09/28/2011   Vertigo    Wears dentures    Upper,   Allergies  Allergen Reactions   Nutrasweet Aspartame [Aspartame] Diarrhea   Lisinopril Cough   Past Surgical History:  Procedure Laterality Date   ABDOMINAL SURGERY  1970's   BREAST BIOPSY  Right 1980   BREAST EXCISIONAL BIOPSY   BREAST BIOPSY Left 1970   BREAST EXCISIONAL BIOPSY   BREAST CYST ASPIRATION     CATARACT EXTRACTION, BILATERAL     COLONOSCOPY     CYSTECTOMY     abdomen   CYSTOSCOPY  W/ RETROGRADES Bilateral 04/27/2019   Procedure: CYSTOSCOPY WITH RETROGRADE PYELOGRAM;  Surgeon: Cleon Gustin, MD;  [facility];  Service: Urology;  Laterality: Bilateral;   EYE SURGERY Bilateral    cataract   INSERTION OF MESH N/A 06/11/2017   Procedure: INSERTION OF MESH;  Surgeon: Rolm Bookbinder, MD;  Location: [facility];  Service: General;  Laterality: N/A;  BILATERAL TAP BLOCK   IR REMOVAL TUN ACCESS W/ PORT W/O FL MOD SED  11/09/2020   TRANSURETHRAL RESECTION OF BLADDER TUMOR N/A 04/27/2019   Procedure: TRANSURETHRAL RESECTION OF BLADDER TUMOR (TURBT);  Surgeon: Cleon Gustin, MD;  Location: [facility];  Service: Urology;  Laterality: N/A;  1 HR   TRANSURETHRAL RESECTION OF BLADDER TUMOR N/A 06/01/2019   Procedure: TRANSURETHRAL RESECTION OF BLADDER TUMOR (TURBT);  Surgeon: Cleon Gustin, MD;  Location[facility];  Service: Urology;  Laterality: N/A;  1 HR   TRANSURETHRAL RESECTION OF BLADDER TUMOR N/A 10/22/2019   Procedure: TRANSURETHRAL RESECTION OF BLADDER TUMOR (TURBT);  Surgeon: Cleon Gustin, MD;  Location: [facility];  Service: Urology;  Laterality: N/A;   TUBAL LIGATION  1970   VENTRAL HERNIA REPAIR N/A 06/11/2017   Procedure: LAPAROSCOPIC VENTRAL HERNIA REPAIR WITH MESH ERAS PATHWAY;  Surgeon: Rolm Bookbinder, MD;  Location: [facility];  Service: General;  Laterality: N/A;  BILATERAL TAP BLOCK   Family History  Problem Relation Age of Onset   Hypertension Mother    Anxiety disorder Mother    Ovarian cancer Mother 55       lung- smoker, ovarian   Lung cancer Mother        smoker   Arthritis Mother    Hyperlipidemia Mother    Alcohol abuse Father    Diabetes Sister     Hypertension Sister    Ovarian cancer Sister    Arthritis Sister    Depression Sister    Hyperlipidemia Sister    Asthma Sister    Depression Sister    Hyperlipidemia Sister    Breast cancer Sister    Asthma Sister    Depression Sister    Arthritis Brother    COPD Brother    Heart attack Brother    Hyperlipidemia Brother    Stroke Brother    Prostate cancer Brother    Other Maternal Grandmother        pacemaker   Multiple sclerosis Maternal Grandfather        ?   Asthma Paternal Uncle    Social History   Social History Narrative   Retired. Lives alone.    Attended some business college.    Former smoker.    Smoke alarm in the home. Wears seat balt.    Wears dentures.    Feels safe in her relationships.        Allergies as of 10/30/2022       Reactions   Nutrasweet Aspartame [aspartame] Diarrhea   Lisinopril Cough        Medication List        Accurate as of October 30, 2022  1:28 PM. If you have any questions, ask your nurse or doctor.          KLOR-CON PO Take 40 mEq by mouth daily.   LORazepam 0.5 MG tablet Commonly known as: ATIVAN Take 1 tablet (0.5 mg total) by mouth 2 (two) times daily as needed for anxiety.   magnesium oxide 400 MG tablet Commonly known as:  MAG-OX Take by mouth daily.   PARoxetine 20 MG tablet Commonly known as: PAXIL Take 1 tablet (20 mg total) by mouth daily.   rivaroxaban 10 MG Tabs tablet Commonly known as: XARELTO Take 1 tablet (10 mg total) by mouth daily.   VITAMIN D PO Take 1,000 Int'l Units/1.45m by mouth.       All past medical history, surgical history, allergies, family history, immunizations andmedications were updated in the EMR today and reviewed under the history and medication portions of their EMR.       ROS 14 pt review of systems performed and negative (unless mentioned in an HPI)  Objective: BP 128/69   Pulse 78   Temp 97.9 F (36.6 C)   Ht '5\' 5"'$  (1.651 m)   Wt 174 lb 9.6 oz (79.2 kg)    SpO2 95%   BMI 29.05 kg/m  Physical Exam Vitals and nursing note reviewed.  Constitutional:      General: She is not in acute distress.    Appearance: Normal appearance. She is not ill-appearing or toxic-appearing.  HENT:     Head: Normocephalic and atraumatic.     Right Ear: Tympanic membrane, ear canal and external ear normal. There is no impacted cerumen.     Left Ear: Tympanic membrane, ear canal and external ear normal. There is no impacted cerumen.     Nose: No congestion or rhinorrhea.     Mouth/Throat:     Mouth: Mucous membranes are moist.     Pharynx: Oropharynx is clear. No oropharyngeal exudate or posterior oropharyngeal erythema.  Eyes:     General: No scleral icterus.       Right eye: No discharge.        Left eye: No discharge.     Extraocular Movements: Extraocular movements intact.     Conjunctiva/sclera: Conjunctivae normal.     Pupils: Pupils are equal, round, and reactive to light.  Cardiovascular:     Rate and Rhythm: Normal rate and regular rhythm.     Pulses: Normal pulses.     Heart sounds: Normal heart sounds. No murmur heard.    No friction rub. No gallop.  Pulmonary:     Effort: Pulmonary effort is normal. No respiratory distress.     Breath sounds: Normal breath sounds. No stridor. No wheezing, rhonchi or rales.  Chest:     Chest wall: No tenderness.  Abdominal:     General: Abdomen is flat. Bowel sounds are normal. There is no distension.     Palpations: Abdomen is soft. There is no mass.     Tenderness: There is no abdominal tenderness. There is no right CVA tenderness, left CVA tenderness, guarding or rebound.     Hernia: No hernia is present.  Musculoskeletal:        General: No swelling, tenderness or deformity. Normal range of motion.     Cervical back: Normal range of motion and neck supple. No rigidity or tenderness.     Right lower leg: No edema.     Left lower leg: No edema.  Lymphadenopathy:     Cervical: No cervical adenopathy.   Skin:    General: Skin is warm and dry.     Coloration: Skin is not jaundiced or pale.     Findings: No bruising, erythema, lesion or rash.  Neurological:     General: No focal deficit present.     Mental Status: She is alert and oriented to person, place, and time. Mental status is  at baseline.     Cranial Nerves: No cranial nerve deficit.     Sensory: No sensory deficit.     Motor: No weakness.     Coordination: Coordination normal.     Gait: Gait normal.     Deep Tendon Reflexes: Reflexes normal.  Psychiatric:        Mood and Affect: Mood normal.        Behavior: Behavior normal.        Thought Content: Thought content normal.        Judgment: Judgment normal.     No results found.  Assessment/plan: Christina [name] is a 82 y.o. female present for CPE and Chronic Conditions/illness Management Osteoporosis/vit d def - VITAMIN D levels collected today - DG Bone Density due 11/2023 -TSH collected today  Elevated hemoglobin A1c/BMI 28.0-28.9,adult -A1c collected today  Situational anxiety Stable.  Continue paxil 20 mg qd/  Continue Ativan qhs prn- NCCs database reviewed.  TSH collected today F/u 5.5 mos.   History of bladder cancer Stable Following with onc and uro  Breast cancer screening by mammogram - MM 3D SCREENING MAMMOGRAM BILATERAL BREAST; Future Lipid screening - Lipid panel Stage 3a chronic kidney disease ([place]) - Basic Metabolic Panel (BMET)  Encounter for Preventive Health care exam: Patient was encouraged to exercise greater than 150 minutes a week. Patient was encouraged to choose a diet filled with fresh fruits and vegetables, and lean meats. AVS provided to patient today for education/recommendation on gender specific health and safety maintenance. Colonoscopy: > 75 n/a Mammogram: Completed 11/23/2021 within normal limits.[facility]> ordered 2024 Cervical cancer screening: > 65, N/A Immunizations: Tetanus UTD 09/2021, declined flu shot.  pneumonia  series completed.  declined shingrix.   Infectious disease screening: N/A  DEXA: 11/2021  (-2.7)  Return in about 24 weeks (around 04/16/2023) for Routine chronic condition follow-up.  Orders Placed This Encounter  Procedures   MM 3D SCREENING MAMMOGRAM BILATERAL BREAST   Hemoglobin A1c   Lipid panel   TSH   Basic Metabolic Panel (BMET)   Vitamin D (25 hydroxy)    Meds ordered this encounter  Medications   LORazepam (ATIVAN) 0.5 MG tablet    Sig: Take 1 tablet (0.5 mg total) by mouth 2 (two) times daily as needed for anxiety.    Dispense:  60 tablet    Refill:  5   PARoxetine (PAXIL) 20 MG tablet    Sig: Take 1 tablet (20 mg total) by mouth daily.    Dispense:  90 tablet    Refill:  1   Referral Orders  No referral(s) requested today     Note is dictated utilizing voice recognition software. Although note has been proof read prior to signing, occasional typographical errors still can be missed. If any questions arise, please do not hesitate to call for verification.  Electronically signed by: Howard Pouch, DO [place]

## 2022-10-30 NOTE — Patient Instructions (Addendum)
Return in about 24 weeks (around 04/16/2023) for Routine chronic condition follow-up.        Great to see you today.  I have refilled the medication(s) we provide.   If labs were collected, we will inform you of lab results once received either by echart message or telephone call.   - echart message- for normal results that have been seen by the patient already.   - telephone call: abnormal results or if patient has not viewed results in their echart.

## 2022-10-31 DIAGNOSIS — R82998 Other abnormal findings in urine: Secondary | ICD-10-CM | POA: Diagnosis not present

## 2022-10-31 DIAGNOSIS — Z8551 Personal history of malignant neoplasm of bladder: Secondary | ICD-10-CM | POA: Diagnosis not present

## 2022-10-31 DIAGNOSIS — N3289 Other specified disorders of bladder: Secondary | ICD-10-CM | POA: Diagnosis not present

## 2022-11-01 ENCOUNTER — Telehealth: Payer: Self-pay | Admitting: Family Medicine

## 2022-11-01 NOTE — Telephone Encounter (Signed)
Spoke with patient regarding results/recommendations.  

## 2022-11-01 NOTE — Telephone Encounter (Signed)
Please inform patient  Electrolytes and kidney function are normal. Calcium is now normal.  Cholesterol panel is at goal for her. Vitamin D is mildly low at 28.  I would encourage her to add back a vitamin D supplement of [phone] units daily.  This supplement should be vitamin D only and not added calcium since she had a mildly elevated calcium last collection.  Her thyroid is functioning normal Her A1c screening is normal

## 2022-11-02 DIAGNOSIS — C7911 Secondary malignant neoplasm of bladder: Secondary | ICD-10-CM | POA: Diagnosis not present

## 2022-11-02 DIAGNOSIS — Z936 Other artificial openings of urinary tract status: Secondary | ICD-10-CM | POA: Diagnosis not present

## 2022-11-02 DIAGNOSIS — K9409 Other complications of colostomy: Secondary | ICD-10-CM | POA: Diagnosis not present

## 2022-11-21 DIAGNOSIS — H43813 Vitreous degeneration, bilateral: Secondary | ICD-10-CM | POA: Diagnosis not present

## 2022-11-21 DIAGNOSIS — Z961 Presence of intraocular lens: Secondary | ICD-10-CM | POA: Diagnosis not present

## 2022-11-21 DIAGNOSIS — H40013 Open angle with borderline findings, low risk, bilateral: Secondary | ICD-10-CM | POA: Diagnosis not present

## 2022-11-21 DIAGNOSIS — H04123 Dry eye syndrome of bilateral lacrimal glands: Secondary | ICD-10-CM | POA: Diagnosis not present

## 2022-12-04 DIAGNOSIS — H04123 Dry eye syndrome of bilateral lacrimal glands: Secondary | ICD-10-CM | POA: Diagnosis not present

## 2022-12-04 DIAGNOSIS — H524 Presbyopia: Secondary | ICD-10-CM | POA: Diagnosis not present

## 2022-12-12 ENCOUNTER — Ambulatory Visit (INDEPENDENT_AMBULATORY_CARE_PROVIDER_SITE_OTHER): Payer: Medicare PPO

## 2022-12-12 DIAGNOSIS — Z1231 Encounter for screening mammogram for malignant neoplasm of breast: Secondary | ICD-10-CM

## 2023-01-02 ENCOUNTER — Encounter: Payer: Self-pay | Admitting: Hematology & Oncology

## 2023-01-02 ENCOUNTER — Ambulatory Visit (HOSPITAL_BASED_OUTPATIENT_CLINIC_OR_DEPARTMENT_OTHER)
Admission: RE | Admit: 2023-01-02 | Discharge: 2023-01-02 | Disposition: A | Discharge status: Home / Self Care | Payer: Medicare PPO | Source: Ambulatory Visit | Attending: Hematology & Oncology | Admitting: Hematology & Oncology

## 2023-01-02 ENCOUNTER — Encounter (HOSPITAL_BASED_OUTPATIENT_CLINIC_OR_DEPARTMENT_OTHER): Payer: Self-pay

## 2023-01-02 ENCOUNTER — Inpatient Hospital Stay: Payer: Medicare PPO | Attending: Hematology & Oncology

## 2023-01-02 ENCOUNTER — Inpatient Hospital Stay (HOSPITAL_BASED_OUTPATIENT_CLINIC_OR_DEPARTMENT_OTHER): Payer: Medicare PPO | Admitting: Hematology & Oncology

## 2023-01-02 VITALS — BP 153/73 | HR 73 | Temp 97.7°F | Resp 20 | Ht 65.0 in | Wt 172.1 lb

## 2023-01-02 DIAGNOSIS — Z7901 Long term (current) use of anticoagulants: Secondary | ICD-10-CM | POA: Diagnosis not present

## 2023-01-02 DIAGNOSIS — K5730 Diverticulosis of large intestine without perforation or abscess without bleeding: Secondary | ICD-10-CM | POA: Diagnosis not present

## 2023-01-02 DIAGNOSIS — Z9221 Personal history of antineoplastic chemotherapy: Secondary | ICD-10-CM | POA: Diagnosis not present

## 2023-01-02 DIAGNOSIS — J9811 Atelectasis: Secondary | ICD-10-CM | POA: Diagnosis not present

## 2023-01-02 DIAGNOSIS — M549 Dorsalgia, unspecified: Secondary | ICD-10-CM | POA: Diagnosis not present

## 2023-01-02 DIAGNOSIS — Z79899 Other long term (current) drug therapy: Secondary | ICD-10-CM | POA: Diagnosis not present

## 2023-01-02 DIAGNOSIS — Z8551 Personal history of malignant neoplasm of bladder: Secondary | ICD-10-CM

## 2023-01-02 DIAGNOSIS — J432 Centrilobular emphysema: Secondary | ICD-10-CM | POA: Diagnosis not present

## 2023-01-02 DIAGNOSIS — C679 Malignant neoplasm of bladder, unspecified: Secondary | ICD-10-CM | POA: Insufficient documentation

## 2023-01-02 LAB — CBC WITH DIFFERENTIAL (CANCER CENTER ONLY)
Abs Immature Granulocytes: 0.02 10*3/uL (ref 0.00–0.07)
Basophils Absolute: 0 10*3/uL (ref 0.0–0.1)
Basophils Relative: 1 %
Eosinophils Absolute: 0.1 10*3/uL (ref 0.0–0.5)
Eosinophils Relative: 1 %
HCT: 41 % (ref 36.0–46.0)
Hemoglobin: 13.5 g/dL (ref 12.0–15.0)
Immature Granulocytes: 0 %
Lymphocytes Relative: 36 %
Lymphs Abs: 2.2 10*3/uL (ref 0.7–4.0)
MCH: 31.9 pg (ref 26.0–34.0)
MCHC: 32.9 g/dL (ref 30.0–36.0)
MCV: 96.9 fL (ref 80.0–100.0)
Monocytes Absolute: 0.5 10*3/uL (ref 0.1–1.0)
Monocytes Relative: 8 %
Neutro Abs: 3.3 10*3/uL (ref 1.7–7.7)
Neutrophils Relative %: 54 %
Platelet Count: 226 10*3/uL (ref 150–400)
RBC: 4.23 MIL/uL (ref 3.87–5.11)
RDW: 13.3 % (ref 11.5–15.5)
WBC Count: 6.1 10*3/uL (ref 4.0–10.5)
nRBC: 0 % (ref 0.0–0.2)

## 2023-01-02 LAB — CMP (CANCER CENTER ONLY)
ALT: 6 U/L (ref 0–44)
AST: 14 U/L — ABNORMAL LOW (ref 15–41)
Albumin: 4.2 g/dL (ref 3.5–5.0)
Alkaline Phosphatase: 71 U/L (ref 38–126)
Anion gap: 10 (ref 5–15)
BUN: 27 mg/dL — ABNORMAL HIGH (ref 8–23)
CO2: 27 mmol/L (ref 22–32)
Calcium: 10.8 mg/dL — ABNORMAL HIGH (ref 8.9–10.3)
Chloride: 104 mmol/L (ref 98–111)
Creatinine: 1.32 mg/dL — ABNORMAL HIGH (ref 0.44–1.00)
GFR, Estimated: 41 mL/min — ABNORMAL LOW (ref >60)
Glucose, Bld: 115 mg/dL — ABNORMAL HIGH (ref 70–99)
Potassium: 4.1 mmol/L (ref 3.5–5.1)
Sodium: 141 mmol/L (ref 135–145)
Total Bilirubin: 1 mg/dL (ref 0.3–1.2)
Total Protein: 7 g/dL (ref 6.5–8.1)

## 2023-01-02 LAB — LACTATE DEHYDROGENASE: LDH: 134 U/L (ref 98–192)

## 2023-01-02 MED ORDER — IOHEXOL 300 MG/ML  SOLN
100.0000 mL | Freq: Once | INTRAMUSCULAR | Status: AC | PRN
  Administered 2023-01-02: 100 mL via INTRAVENOUS

## 2023-01-02 NOTE — Progress Notes (Signed)
Hematology and Oncology Follow Up Visit  Christina [name] [id number] 1941-02-10 82 y.o. 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 daily-6 months of therapy to start on 06/30/2021 -  changed to Xarelto 10 mg po q day on 12/26/2021     Interim History:  Christina [name] is back for follow-up.  She is still bothered by her back.  She had an MRI of the lumbar spine back in July of last year.  She does have arthritic changes.  She did see Dr. Yevette Edwards of Spinal Surgery.  He did not feel that there was anything that he needed to do for her.  She is still having some problems.  Again, I am not sure what else we can try to help her.  She did have a CT scan today of the body.  Results not back yet.  She is on Xarelto.  She is on 10 mg a day of Xarelto.  I would think we need to keep her on the low-dose Xarelto.  She has had no fever.  She has had a decent appetite.  There is no problem with her urostomy bag.  She has had no problems with bowel movements.  Is been no leg swelling.  She has had no rashes.  Overall, I would have to say that her performance status is probably ECOG 1.  Medications:  Current Outpatient Medications:    LORazepam (ATIVAN) 0.5 MG tablet, Take 1 tablet (0.5 mg total) by mouth 2 (two) times daily as needed for anxiety., Disp: 60 tablet, Rfl: 5   PARoxetine (PAXIL) 20 MG tablet, Take 1 tablet (20 mg total) by mouth daily., Disp: 90 tablet, Rfl: 1   polyethylene glycol (MIRALAX / GLYCOLAX) 17 g packet, Take 17 g by mouth daily as needed., Disp: , Rfl:    rivaroxaban (XARELTO) 10 MG TABS tablet, Take 1 tablet (10 mg total) by mouth daily., Disp: 90 tablet, Rfl: 3   VITAMIN D PO, Take 1,000 Int'l Units/1.36m2 by mouth., Disp: , Rfl:   Allergies:   Allergies  Allergen Reactions   Nutrasweet Aspartame [Aspartame] Diarrhea   Lisinopril Cough    Past Medical History, Surgical history, Social history, and Family History were reviewed and updated.  Review of Systems: Review of Systems  Constitutional: Negative.   HENT:  Negative.    Eyes: Negative.   Respiratory: Negative.    Cardiovascular: Negative.   Gastrointestinal: Negative.   Endocrine: Negative.   Genitourinary: Negative.    Musculoskeletal: Negative.   Skin: Negative.   Neurological: Negative.   Hematological: Negative.   Psychiatric/Behavioral: Negative.      Physical Exam:  height is 5\' 5"  (1.651 m) and weight is 172 lb 1.9 oz (78.1 kg). Her oral temperature is 97.7 F (36.5 C). Her blood pressure is 153/73 (abnormal) and her pulse is 73. Her respiration is 20 and oxygen saturation is 97%.   Wt Readings from Last 3 Encounters:  01/02/23 172 lb 1.9 oz (78.1 kg)  10/30/22 174 lb 9.6 oz (79.2 kg)  10/03/22 171 lb (77.6 kg)    Physical Exam Vitals reviewed.  HENT:     Head: Normocephalic and atraumatic.  Eyes:     Pupils: Pupils are equal, round, and reactive to light.  Cardiovascular:     Rate and  Rhythm: Normal rate and regular rhythm.     Heart sounds: Normal heart sounds.  Pulmonary:     Effort: Pulmonary effort is normal.     Breath sounds: Normal breath sounds.  Abdominal:     General: Bowel sounds are normal.     Palpations: Abdomen is soft.     Comments: Abdominal exam shows the healed laparotomy scar in the midline.  She has some firmness in the middle of the laparotomy scar.  She has the urostomy bag which is well-positioned.  Urine is clear.  There is no fluid wave in the abdomen.  Bowel sounds are present.  She has no palpable liver or spleen tip.  Musculoskeletal:        General: No tenderness or deformity. Normal range of motion.     Cervical back: Normal range of motion.  Lymphadenopathy:     Cervical: No cervical adenopathy.  Skin:     General: Skin is warm and dry.     Findings: No erythema or rash.  Neurological:     Mental Status: She is alert and oriented to person, place, and time.  Psychiatric:        Behavior: Behavior normal.        Thought Content: Thought content normal.        Judgment: Judgment normal.    Lab Results  Component Value Date   WBC 6.1 01/02/2023   HGB 13.5 01/02/2023   HCT 41.0 01/02/2023   MCV 96.9 01/02/2023   PLT 226 01/02/2023     Chemistry      Component Value Date/Time   NA 141 01/02/2023 1056   NA 142 03/04/2017 1016   K 4.1 01/02/2023 1056   CL 104 01/02/2023 1056   CO2 27 01/02/2023 1056   BUN 27 (H) 01/02/2023 1056   BUN 13 03/04/2017 1016   CREATININE 1.32 (H) 01/02/2023 1056   CREATININE 0.78 05/20/2015 1704      Component Value Date/Time   CALCIUM 10.8 (H) 01/02/2023 1056   ALKPHOS 71 01/02/2023 1056   AST 14 (L) 01/02/2023 1056   ALT 6 01/02/2023 1056   BILITOT 1.0 01/02/2023 1056      Impression and Plan:  Christina [name] is a 82 year old white female.  She had superficial bladder cancer.  She was treated with intravesicular therapy.  However, she has had had muscle invasive cancer which was poorly differentiated.  She then underwent neoadjuvant chemotherapy followed by radical cystectomy.  She was found to have stage IIIB disease.  We will have to see what the CT scan shows.  Again I would think that there will be no evidence of recurrent disease.  We will plan to see her back after the Summer.  I think we can get her through the Summer.  I forgot to mention that she has seen a urologist.  She sees Dr. Arita Miss who she really likes.   Josph Macho, MD 5/8/202412:46 PM

## 2023-01-04 ENCOUNTER — Encounter: Payer: Self-pay | Admitting: *Deleted

## 2023-01-06 ENCOUNTER — Other Ambulatory Visit: Payer: Self-pay | Admitting: Hematology & Oncology

## 2023-01-07 ENCOUNTER — Encounter: Payer: Self-pay | Admitting: Hematology & Oncology

## 2023-01-09 ENCOUNTER — Ambulatory Visit (INDEPENDENT_AMBULATORY_CARE_PROVIDER_SITE_OTHER): Payer: Medicare PPO

## 2023-01-09 VITALS — BP 122/74 | Wt 174.0 lb

## 2023-01-09 DIAGNOSIS — Z0000 Encounter for general adult medical examination without abnormal findings: Secondary | ICD-10-CM | POA: Diagnosis not present

## 2023-01-09 NOTE — Progress Notes (Signed)
Subjective:   Christina [name] is a 82 y.o. female who presents for Medicare Annual (Subsequent) preventive examination.  Review of Systems    Defer to pcp Cardiac Risk Factors include: advanced age (>29men, >37 women)     Objective:    Today's Vitals   01/09/23 1327  BP: 122/74  SpO2: 98%  Weight: 174 lb (78.9 kg)   Body mass index is 28.96 kg/m.     01/09/2023    1:25 PM 01/02/2023   12:19 PM 10/03/2022   11:55 AM 07/30/2022   12:24 PM 05/29/2022   10:15 AM 04/17/2022    9:55 AM 03/06/2022    3:41 PM  Advanced Directives  Does Patient Have a Medical Advance Directive? Yes Yes Yes Yes Yes Yes Yes  Type of Estate agent of [place];Living will Healthcare Power of [place];Living will Healthcare Power of [place];Living will Healthcare Power of [place];Living will Healthcare Power of [place];Living will Healthcare Power of [place];Living will   Does patient want to make changes to medical advance directive?   No - Patient declined No - Patient declined  No - Patient declined No - Patient declined  Copy of Healthcare Power of Attorney in Chart? No - copy requested No - copy requested No - copy requested  No - copy requested No - copy requested No - copy requested    Current Medications (verified) Outpatient Encounter Medications as of 01/09/2023  Medication Sig   LORazepam (ATIVAN) 0.5 MG tablet Take 1 tablet (0.5 mg total) by mouth 2 (two) times daily as needed for anxiety.   PARoxetine (PAXIL) 20 MG tablet Take 1 tablet (20 mg total) by mouth daily.   polyethylene glycol (MIRALAX / GLYCOLAX) 17 g packet Take 17 g by mouth daily as needed.   VITAMIN D PO Take 1,000 Int'l Units/1.38m2 by mouth.   XARELTO 10 MG TABS tablet TAKE 1 TABLET BY MOUTH EVERY DAY   No facility-administered encounter medications on file as of 01/09/2023.    Allergies (verified) Nutrasweet aspartame [aspartame] and Lisinopril   History: Past Medical History:  Diagnosis Date    Arthritis    Asthma    a little?, no problems in several years   Bladder cancer (HCC)    Bladder tumor    Cervical cancer screening 01/31/2012   Chicken pox as a child   Degenerative tear of acetabular labrum of left hip 02/26/2019   Dehydration 01/19/2020   Depression with anxiety 06/07/2009   Qualifier: Diagnosis of  By: Linford Arnold MD, Catherine     Dermatitis of external ear 07/11/2012   Diverticulosis    Essential hypertension, benign 10/13/2007   Qualifier: Diagnosis of  By: Linford Arnold MD, Catherine     Ganglion cyst 02/26/2019   Right elbow   Hiatal hernia with gastroesophageal reflux 10/26/2010   Qualifier: Diagnosis of  By: Linford Arnold MD, Catherine     Hyperglycemia 06/21/2013   Hyperlipidemia, mixed 10/16/2015   pt unaware   Hypertension    Left hip pain 07/10/2015   Lesion of breast 12/03/2016   benign; resolved   Low back pain 05/29/2015   Measles as a child   Mumps as a child   Osteopenia 01/03/2017   Overweight (BMI 25.0-29.9) 10/07/2008   Qualifier: Diagnosis of  By: Linford Arnold MD, Santina Evans     Palpitations    several years ago, not currently   Pre-diabetes    Psoriasis    ears   RUQ pain 05/29/2015   Spider veins  Bilateral legs   Spinal stenosis    Tailor's bunionette, left 09/02/2017   Umbilical hernia    Urinary frequency 09/28/2011   Vertigo    Wears dentures    Upper,   Past Surgical History:  Procedure Laterality Date   ABDOMINAL SURGERY  1970's   BREAST BIOPSY Right 1980   BREAST EXCISIONAL BIOPSY   BREAST BIOPSY Left 1970   BREAST EXCISIONAL BIOPSY   BREAST CYST ASPIRATION     CATARACT EXTRACTION, BILATERAL     COLONOSCOPY     CYSTECTOMY     abdomen   CYSTOSCOPY W/ RETROGRADES Bilateral 04/27/2019   Procedure: CYSTOSCOPY WITH RETROGRADE PYELOGRAM;  Surgeon: Malen Gauze, MD;  Location: [facility];  Service: Urology;  Laterality: Bilateral;   EYE SURGERY Bilateral    cataract   INSERTION OF MESH N/A 06/11/2017   Procedure:  INSERTION OF MESH;  Surgeon: Emelia Loron, MD;  Location: [facility] OR;  Service: General;  Laterality: N/A;  BILATERAL TAP BLOCK   IR REMOVAL TUN ACCESS W/ PORT W/O FL MOD SED  11/09/2020   TRANSURETHRAL RESECTION OF BLADDER TUMOR N/A 04/27/2019   Procedure: TRANSURETHRAL RESECTION OF BLADDER TUMOR (TURBT);  Surgeon: Malen Gauze, MD;  Location: [facility];  Service: Urology;  Laterality: N/A;  1 HR   TRANSURETHRAL RESECTION OF BLADDER TUMOR N/A 06/01/2019   Procedure: TRANSURETHRAL RESECTION OF BLADDER TUMOR (TURBT);  Surgeon: Malen Gauze, MD;  Location: [facility];  Service: Urology;  Laterality: N/A;  1 HR   TRANSURETHRAL RESECTION OF BLADDER TUMOR N/A 10/22/2019   Procedure: TRANSURETHRAL RESECTION OF BLADDER TUMOR (TURBT);  Surgeon: Malen Gauze, MD;  Location: [facility];  Service: Urology;  Laterality: N/A;   TUBAL LIGATION  1970   VENTRAL HERNIA REPAIR N/A 06/11/2017   Procedure: LAPAROSCOPIC VENTRAL HERNIA REPAIR WITH MESH ERAS PATHWAY;  Surgeon: Emelia Loron, MD;  Location: [facility] OR;  Service: General;  Laterality: N/A;  BILATERAL TAP BLOCK   Family History  Problem Relation Age of Onset   Hypertension Mother    Anxiety disorder Mother    Ovarian cancer Mother 75       lung- smoker, ovarian   Lung cancer Mother        smoker   Arthritis Mother    Hyperlipidemia Mother    Alcohol abuse Father    Diabetes Sister    Hypertension Sister    Ovarian cancer Sister    Arthritis Sister    Depression Sister    Hyperlipidemia Sister    Asthma Sister    Depression Sister    Hyperlipidemia Sister    Breast cancer Sister    Asthma Sister    Depression Sister    Arthritis Brother    COPD Brother    Heart attack Brother    Hyperlipidemia Brother    Stroke Brother    Prostate cancer Brother    Other Maternal Grandmother        pacemaker   Multiple sclerosis Maternal Grandfather        ?   Asthma Paternal Uncle     Social History   Socioeconomic History   Marital status: Widowed    Spouse name: Not on file   Number of children: 2   Years of education: Not on file   Highest education level: Not on file  Occupational History   Occupation: retired  Tobacco Use   Smoking status: Former    Packs/day: 1.00  Years: 30.00    Additional pack years: 0.00    Total pack years: 30.00    Types: Cigarettes    Quit date: 08/27/1990    Years since quitting: 32.3   Smokeless tobacco: Never  Vaping Use   Vaping Use: Never used  Substance and Sexual Activity   Alcohol use: No   Drug use: No   Sexual activity: Not Currently    Birth control/protection: Post-menopausal    Comment: lives alone, no dietary restrictions  Other Topics Concern   Not on file  Social History Narrative   Retired. Lives alone.    Attended some business college.    Former smoker.    Smoke alarm in the home. Wears seat balt.    Wears dentures.    Feels safe in her relationships.       Social Determinants of Health   Financial Resource Strain: Low Risk  (01/09/2023)   Overall Financial Resource Strain (CARDIA)    Difficulty of Paying Living Expenses: Not hard at all  Food Insecurity: No Food Insecurity (01/09/2023)   Hunger Vital Sign    Worried About Running Out of Food in the Last Year: Never true    Ran Out of Food in the Last Year: Never true  Transportation Needs: No Transportation Needs (01/09/2023)   PRAPARE - Administrator, Civil Service (Medical): No    Lack of Transportation (Non-Medical): No  Physical Activity: Inactive (01/09/2023)   Exercise Vital Sign    Days of Exercise per Week: 0 days    Minutes of Exercise per Session: 0 min  Stress: No Stress Concern Present (01/09/2023)   [employer] of Occupational Health - Occupational Stress Questionnaire    Feeling of Stress : Not at all  Social Connections: Moderately Integrated (01/09/2023)   Social Connection and Isolation Panel [NHANES]     Frequency of Communication with Friends and Family: More than three times a week    Frequency of Social Gatherings with Friends and Family: More than three times a week    Attends Religious Services: More than 4 times per year    Active Member of [employer] or Organizations: Yes    Attends Banker Meetings: 1 to 4 times per year    Marital Status: Widowed    Tobacco Counseling Counseling given: Not Answered   Clinical Intake:              How often do you need to have someone help you when you read instructions, pamphlets, or other written materials from your doctor or pharmacy?: 1 - Never What is the last grade level you completed in school?: college  Diabetic?no         Activities of Daily Living    01/09/2023    1:26 PM  In your present state of health, do you have any difficulty performing the following activities:  Hearing? 0  Vision? 0  Difficulty concentrating or making decisions? 0  Walking or climbing stairs? 0  Dressing or bathing? 0  Doing errands, shopping? 0  Preparing Food and eating ? N  Using the Toilet? N  In the past six months, have you accidently leaked urine? N  Do you have problems with loss of bowel control? N  Managing your Medications? N  Managing your Finances? N  Housekeeping or managing your Housekeeping? N    Patient Care Team: Natalia Leatherwood, DO as PCP - General (Family Medicine) Emelia Loron, MD as Consulting Physician (General Surgery)  Mateo Flow, MD as Consulting Physician (Ophthalmology) Carola Frost, DDS as Consulting Physician (Dentistry) Charna Elizabeth, MD as Consulting Physician (Gastroenterology) Myna Hidalgo Rose Phi, MD as Medical Oncologist (Oncology) Sheran Luz, MD as Consulting Physician (Physical Medicine and Rehabilitation) Judi Saa, DO as Consulting Physician (Sports Medicine) Noel Christmas, MD as Consulting Physician (Urology)  Indicate any recent Medical Services you may have  received from other than [facility] providers in the past year (date may be approximate).     Assessment:   This is a routine wellness examination for [place].  Hearing/Vision screen No results found.  Dietary issues and exercise activities discussed: Current Exercise Habits: The patient does not participate in regular exercise at present   Goals Addressed             This Visit's Progress    Patient Stated       Walk better. Get answers for back.       Depression Screen    01/09/2023    1:24 PM 08/17/2022    9:09 AM 04/10/2022   11:13 AM 01/08/2022    3:05 PM 01/03/2022    1:38 PM 10/24/2021   10:54 AM 04/25/2021   11:37 AM  PHQ 2/9 Scores  PHQ - 2 Score 0 0 2 1 0 1 1  PHQ- 9 Score  0 10   9 3     Fall Risk    08/17/2022    9:10 AM 01/08/2022    3:06 PM 01/03/2022    1:41 PM 10/24/2021   10:31 AM 12/28/2020    1:40 PM  Fall Risk   Falls in the past year? 0 1 1 1  0  Number falls in past yr:  0 1 0 0  Injury with Fall?  0 0 0 0  Risk for fall due to : No Fall Risks   No Fall Risks   Follow up Falls evaluation completed  Falls prevention discussed Falls evaluation completed Falls prevention discussed    FALL RISK PREVENTION PERTAINING TO THE HOME:  Any stairs in or around the home? Yes  If so, are there any without handrails? No  Home free of loose throw rugs in walkways, pet beds, electrical cords, etc? Yes  Adequate lighting in your home to reduce risk of falls? Yes   ASSISTIVE DEVICES UTILIZED TO PREVENT FALLS:  Life alert? No  Use of a cane, walker or w/c? Yes  Grab bars in the bathroom? Yes  Shower chair or bench in shower? Yes  Elevated toilet seat or a handicapped toilet? No   TIMED UP AND GO:  Was the test performed? Yes .  Length of time to ambulate 10 feet: 8 sec.   Gait slow and steady with assistive device  Cognitive Function:    10/05/2016    9:44 AM  MMSE - Mini Mental State Exam  Orientation to time 5  Orientation to Place 5  Registration  3  Attention/ Calculation 5  Recall 3  Language- name 2 objects 2  Language- repeat 1  Language- follow 3 step command 3  Language- read & follow direction 1  Write a sentence 1  Copy design 1  Total score 30        01/09/2023    1:26 PM 01/03/2022    1:44 PM 12/28/2020    1:47 PM  6CIT Screen  What Year? 0 points 0 points 0 points  What month? 0 points 0 points 0 points  What time? 0 points 0 points  0 points  Count back from 20 0 points 0 points 0 points  Months in reverse 0 points 0 points 2 points  Repeat phrase 0 points 0 points 0 points  Total Score 0 points 0 points 2 points    Immunizations Immunization History  Administered Date(s) Administered   Influenza Whole 06/07/2009, 05/28/2011, 05/28/2012   Influenza, High Dose Seasonal PF 05/20/2015   Influenza,inj,Quad PF,6+ Mos 05/26/2013, 05/31/2014   Moderna Sars-Covid-2 Vaccination 12/11/2019, 02/12/2020, 09/27/2020   Pneumococcal Conjugate-13 05/31/2014   Pneumococcal Polysaccharide-23 06/07/2009   Td 01/31/2003   Td (Adult), 2 Lf Tetanus Toxid, Preservative Free 01/31/2003   Tdap 09/28/2011, 10/24/2021    TDAP status: Up to date  Flu Vaccine status: Up to date  Pneumococcal vaccine status: Up to date  Covid-19 vaccine status: Completed vaccines  Qualifies for Shingles Vaccine? Yes   Zostavax completed No   Shingrix Completed?: Yes  Screening Tests Health Maintenance  Topic Date Due   INFLUENZA VACCINE  03/28/2023   DEXA SCAN  11/30/2023   Medicare Annual Wellness (AWV)  01/09/2024   MAMMOGRAM  12/11/2024   DTaP/Tdap/Td (5 - Td or Tdap) 10/25/2031   Pneumonia Vaccine 87+ Years old  Completed   HPV VACCINES  Aged Out   COVID-19 Vaccine  Discontinued   Zoster Vaccines- Shingrix  Discontinued    Health Maintenance  There are no preventive care reminders to display for this patient.  Colorectal cancer screening: No longer required.   Mammogram status: No longer required due to age.  Bone  Density status: Completed 11/29/21. Results reflect: Bone density results: OSTEOPOROSIS. Repeat every 2 years.  Lung Cancer Screening: (Low Dose CT Chest recommended if Age 55-80 years, 30 pack-year currently smoking OR have quit w/in 15years.) does not qualify.   Lung Cancer Screening Referral: n/a  Additional Screening:  Hepatitis C Screening: does not qualify; Completed n/a  Vision Screening: Recommended annual ophthalmology exams for early detection of glaucoma and other disorders of the eye. Is the patient up to date with their annual eye exam?  Yes  Who is the provider or what is the name of the office in which the patient attends annual eye exams? [facility] If pt is not established with a provider, would they like to be referred to a provider to establish care? No .   Dental Screening: Recommended annual dental exams for proper oral hygiene  Community Resource Referral / Chronic Care Management: CRR required this visit?  No   CCM required this visit?  No      Plan:     I have personally reviewed and noted the following in the patient's chart:   Medical and social history Use of alcohol, tobacco or illicit drugs  Current medications and supplements including opioid prescriptions. Patient is not currently taking opioid prescriptions. Functional ability and status Nutritional status Physical activity Advanced directives List of other physicians Hospitalizations, surgeries, and ER visits in previous 12 months Vitals Screenings to include cognitive, depression, and falls Referrals and appointments  In addition, I have reviewed and discussed with patient certain preventive protocols, quality metrics, and best practice recommendations. A written personalized care plan for preventive services as well as general preventive health recommendations were provided to patient.     Filomena Jungling, CMA   01/09/2023   Nurse Notes: Non-Face to Face or Face to Face 20 minute  visit Encounter    Ms. Ann Maki , Thank you for taking time to come for your Medicare Wellness Visit. I  appreciate your ongoing commitment to your health goals. Please review the following plan we discussed and let me know if I can assist you in the future.   These are the goals we discussed:  Goals      Increase physical activity     Patient Stated     Eating healthier & drink more water     Patient Stated     Get rid of this pain      Patient Stated     Walk better. Get answers for back.     Weight (lb) < 180 lb (81.6 kg)        This is a list of the screening recommended for you and due dates:  Health Maintenance  Topic Date Due   Flu Shot  03/28/2023   DEXA scan (bone density measurement)  11/30/2023   Medicare Annual Wellness Visit  01/09/2024   Mammogram  12/11/2024   DTaP/Tdap/Td vaccine (5 - Td or Tdap) 10/25/2031   Pneumonia Vaccine  Completed   HPV Vaccine  Aged Out   COVID-19 Vaccine  Discontinued   Zoster (Shingles) Vaccine  Discontinued

## 2023-01-09 NOTE — Patient Instructions (Signed)

## 2023-01-20 IMAGING — CT CT ABD-PELV W/ CM
2 of 5 series · 14 of 46 positions shown, 16 images · IV contrast (Omnipaque)
Comparison: Chest CTA 08/03/2021. CT of the chest, abdomen and
pelvis 06/30/2021.

CLINICAL DATA: On anticoagulation for pulmonary embolism. Assess
for response. History of invasive bladder cancer post resection and
chemotherapy.



[Series 2: axial st · axial · 0.94mm/px · z∈[-633,-208]mm · 11 of 95 slices shown, 13 images]
[im 5/95  soft-tissue]
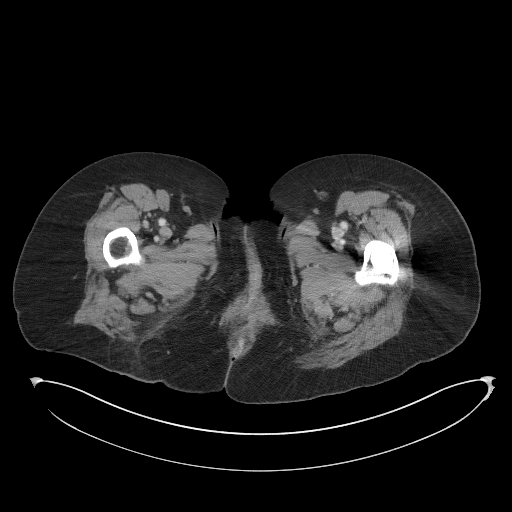
[im 5/95  bone]
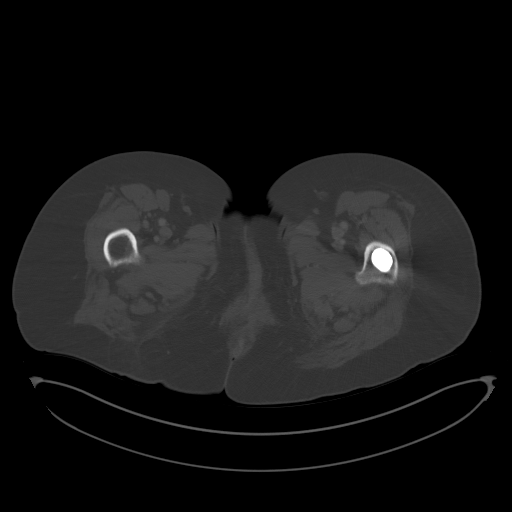
[im 15/95  soft-tissue]
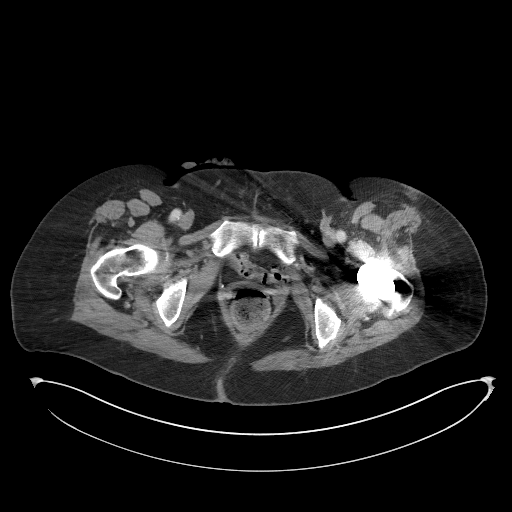
[im 25/95  soft-tissue]
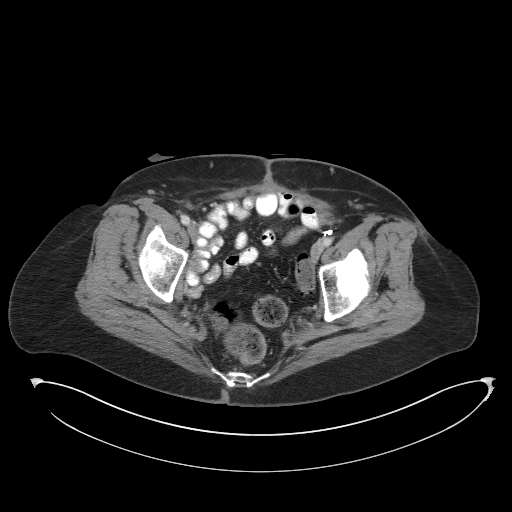
[im 30/95  soft-tissue]
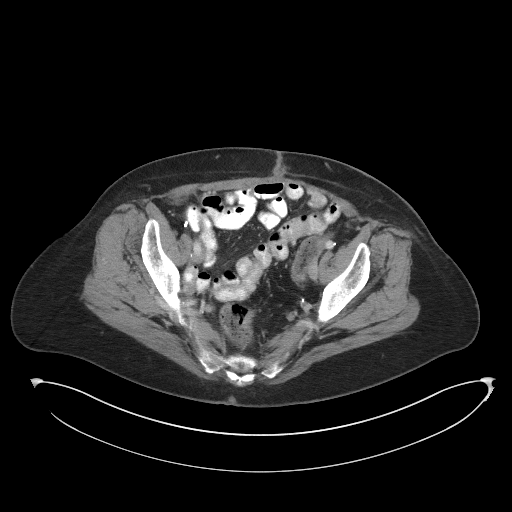
[im 40/95  soft-tissue]
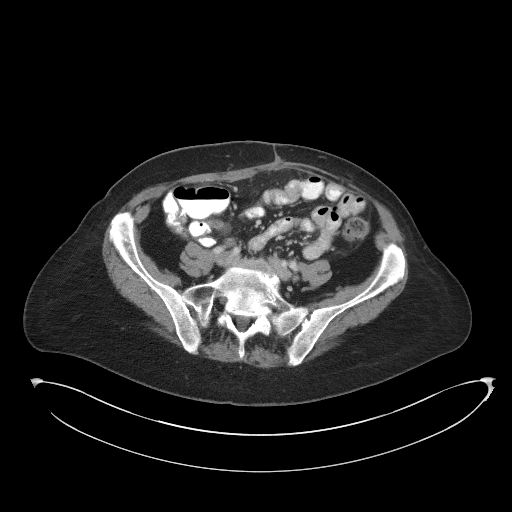
[im 50/95  soft-tissue]
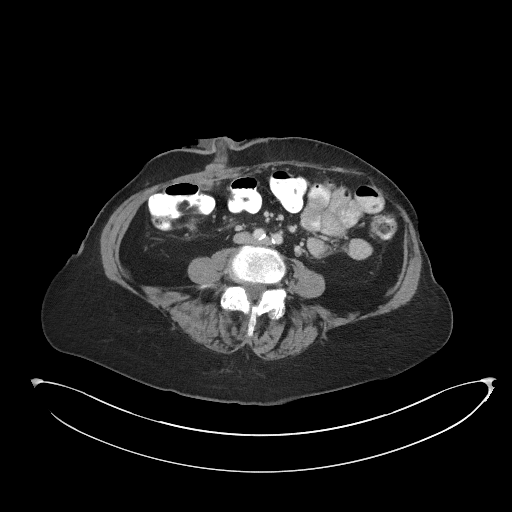
[im 55/95  soft-tissue]
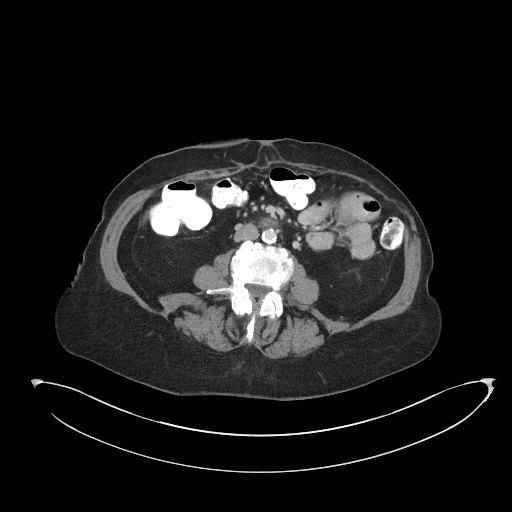
[im 65/95  soft-tissue]
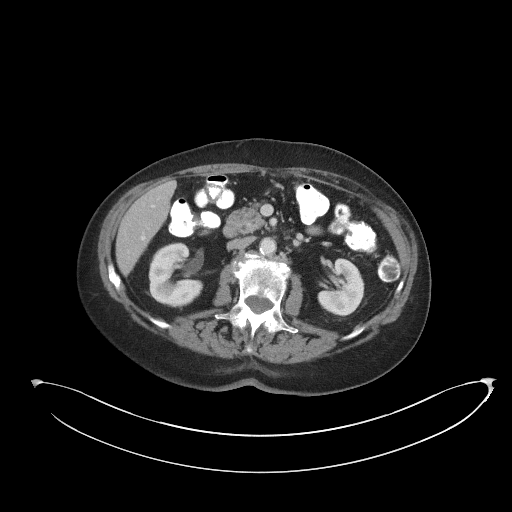
[im 70/95  soft-tissue]
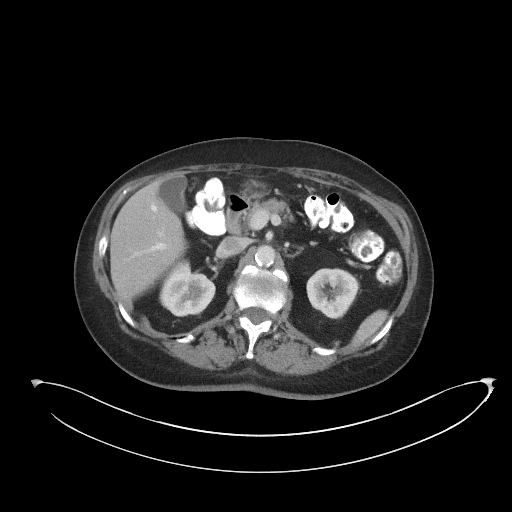
[im 70/95  bone]
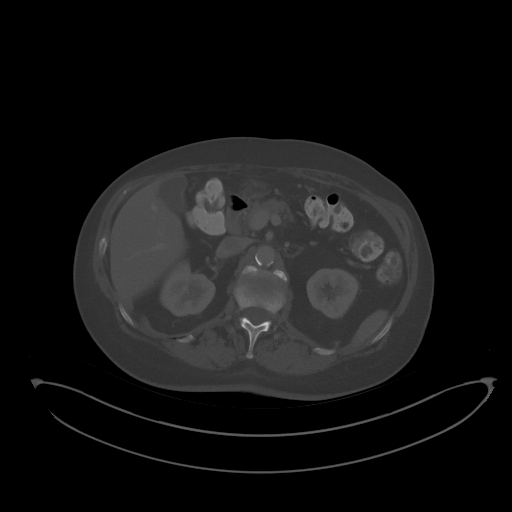
[im 80/95  soft-tissue]
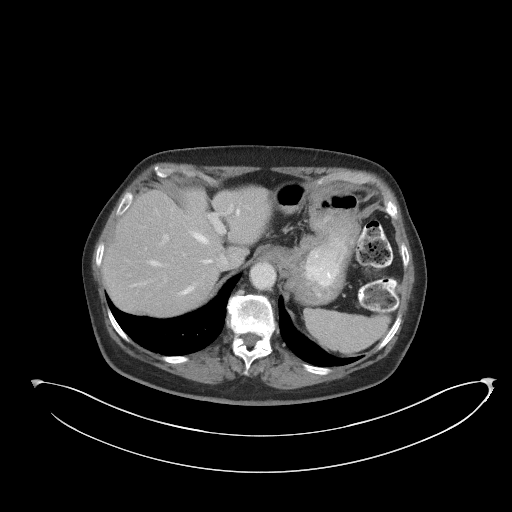
[im 90/95  soft-tissue]
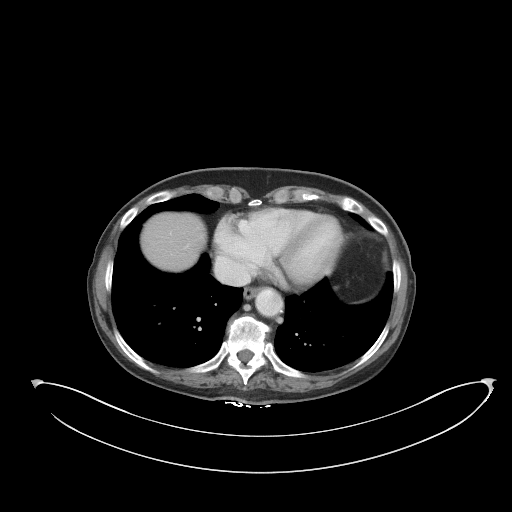

[Series 5: abd/pel coronal st · coronal · 0.87mm/px · 3 of 84 slices shown]
[im 28/84  soft-tissue]
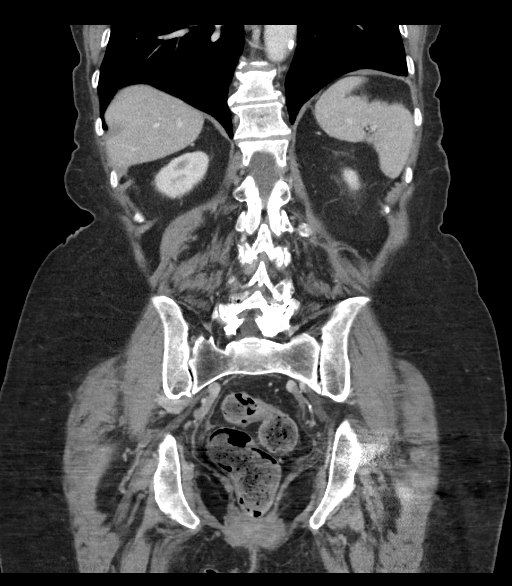
[im 37/84  soft-tissue]
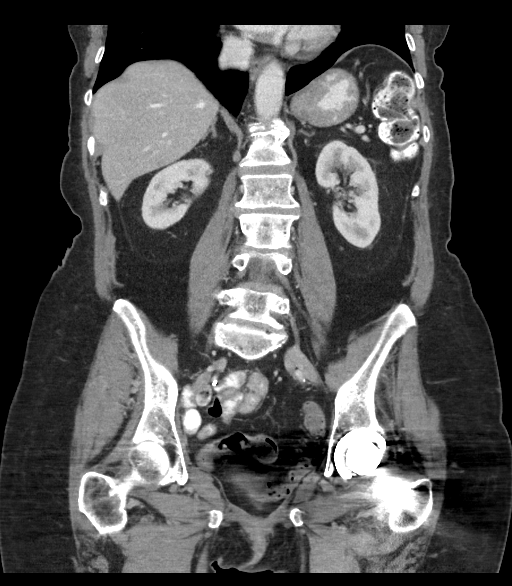
[im 47/84  soft-tissue]
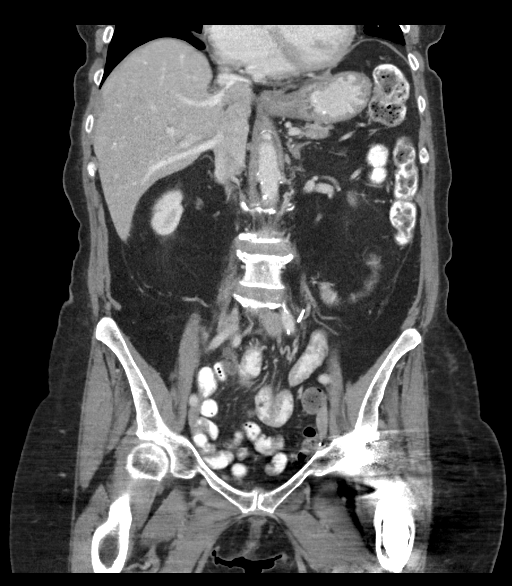

[14 of 46 positions shown; findings below may reference images not displayed]

RADIATION DOSE REDUCTION: This exam was performed according to the
departmental dose-optimization program which includes automated
exposure control, adjustment of the mA and/or kV according to
patient size and/or use of iterative reconstruction technique.

CONTRAST:  100mL OMNIPAQUE IOHEXOL 350 MG/ML SOLN
FINDINGS: CTA CHEST FINDINGS

Cardiovascular: The pulmonary arteries are well opacified with
contrast to the level of the subsegmental branches. There is no
evidence of recurrent acute pulmonary embolism. No residual filling
defects are seen within the right lower lobe pulmonary artery.
Atherosclerosis of the aorta, great vessels and coronary arteries
again noted without acute vascular findings. The heart size is
normal. There is no pericardial effusion.

Mediastinum/Nodes: There are no enlarged mediastinal, hilar or
axillary lymph nodes. The thyroid gland, trachea and esophagus
demonstrate no significant findings.

Lungs/Pleura: No pleural effusion or pneumothorax. Stable mild
central airway thickening and mild dependent atelectasis at both
lung bases. No suspicious pulmonary nodularity or confluent airspace
opacity. Previously demonstrated 4 mm left lower lobe nodule on
image 58/5 is unchanged.

Musculoskeletal/Chest wall: There is no chest wall mass or
suspicious osseous finding. Grossly stable nodularity and
calcifications within the breasts. Mild thoracic spondylosis.

Review of the MIP images confirms the above findings.

CT ABDOMEN and PELVIS FINDINGS

Hepatobiliary: The liver is normal in density without suspicious
focal abnormality. No evidence of gallstones, gallbladder wall
thickening or biliary dilatation.

Pancreas: Unremarkable. No pancreatic ductal dilatation or
surrounding inflammatory changes.

Spleen: Normal in size without focal abnormality.

Adrenals/Urinary Tract: Stable 1.5 cm right adrenal nodule measuring
35 HU, consistent with an adenoma based on stability. The left
adrenal gland appears normal. Both kidneys appear normal. No
evidence of urinary tract calculus, hydronephrosis or perinephric
soft tissue stranding. No collecting system abnormalities are
identified status post cystectomy and ileal conduit formation in the
right lower quadrant.

Stomach/Bowel: Enteric contrast was administered and has passed into
the distal colon. The stomach appears unremarkable for its degree of
distension. No evidence of bowel wall thickening, distention or
surrounding inflammatory change. Mild descending colonic
diverticulosis.

Vascular/Lymphatic: There are no enlarged abdominal or pelvic lymph
nodes. Stable retroperitoneal surgical clips from previous pelvic
lymphadenectomy. Mild aortic and branch vessel atherosclerosis. No
acute vascular findings or aneurysm.

Reproductive: Hysterectomy. No evidence of adnexal mass. The lower
pelvis is partly obscured by artifact from the left total hip
arthroplasty.

Other: Postsurgical changes in the low anterior abdominal wall with
a small periumbilical hernia containing only fat. No ascites or
peritoneal nodularity.

Musculoskeletal: No acute or significant osseous findings. Mild
lumbar spondylosis. Previous left total hip arthroplasty.

Review of the MIP images confirms the above findings.
IMPRESSION: 1. No evidence of recurrent acute pulmonary embolism. Previously
demonstrated small nonocclusive filling defect in the right lower
lobe pulmonary nodule artery is no longer visualized.
2. No acute chest findings or evidence of metastatic disease.
3. Stable postsurgical changes from cystectomy and urinary
diversion. No evidence of metastatic disease in the abdomen or
pelvis.
4. Coronary and Aortic Atherosclerosis ([id number]).
5. Stable right adrenal adenoma.

## 2023-01-20 IMAGING — CT CT ANGIO CHEST
2 of 8 series · 16 of 36 positions shown · IV contrast (Omnipaque)
Comparison: Chest CTA 08/03/2021. CT of the chest, abdomen and
pelvis 06/30/2021.

CLINICAL DATA: On anticoagulation for pulmonary embolism. Assess
for response. History of invasive bladder cancer post resection and
chemotherapy.



[Series 6: pe thins · axial · 0.71mm/px · z∈[-278,-26]mm · 15 of 282 slices shown]
[im 15/282  lung]
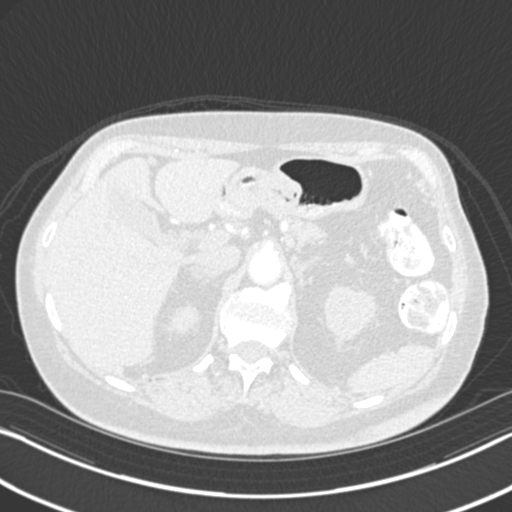
[im 30/282  mediastinal]
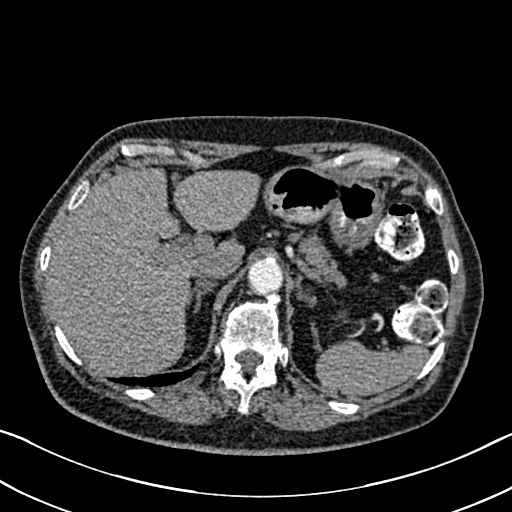
[im 60/282  lung]
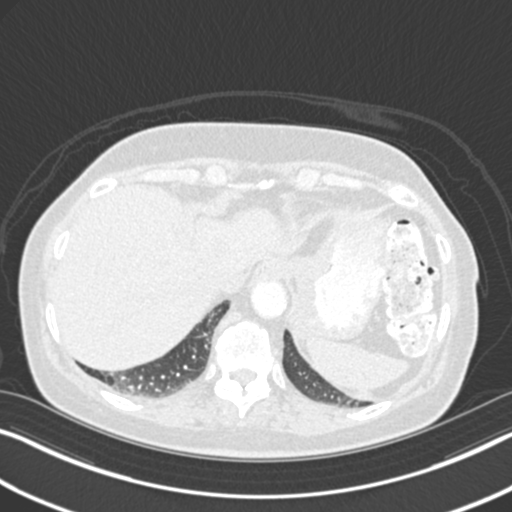
[im 74/282  mediastinal]
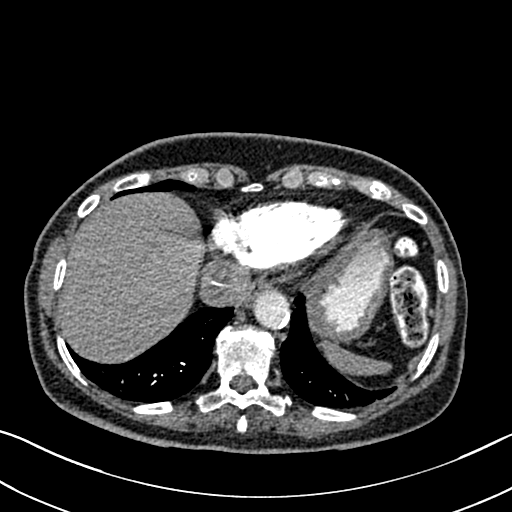
[im 89/282  lung]
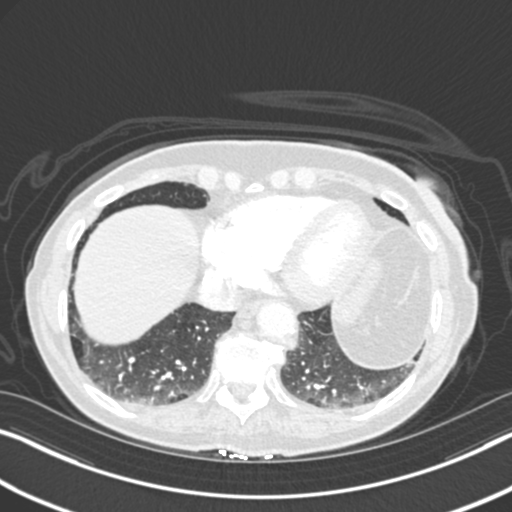
[im 104/282  mediastinal]
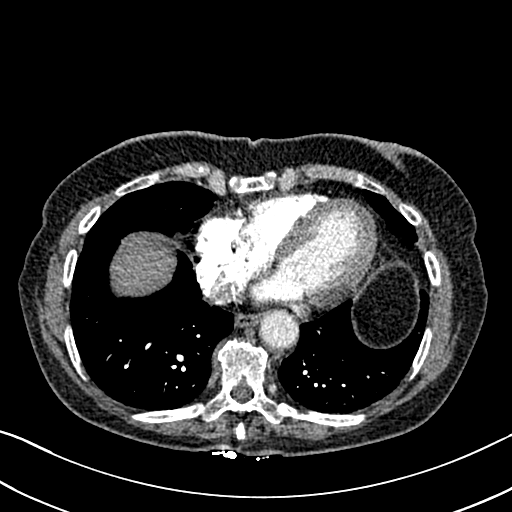
[im 119/282  lung]
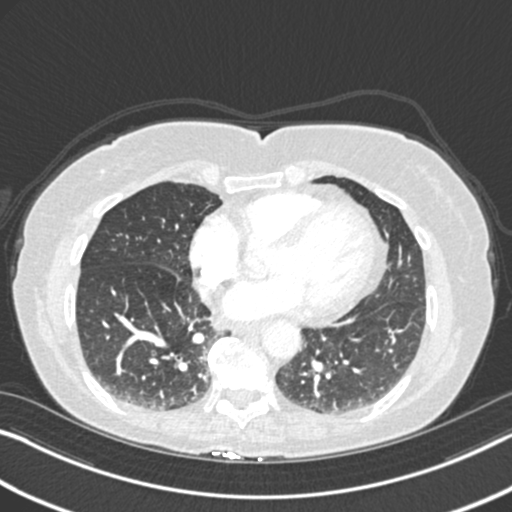
[im 148/282  mediastinal]
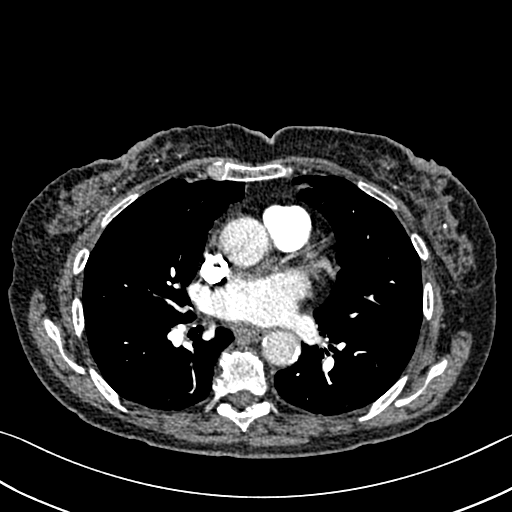
[im 163/282  lung]
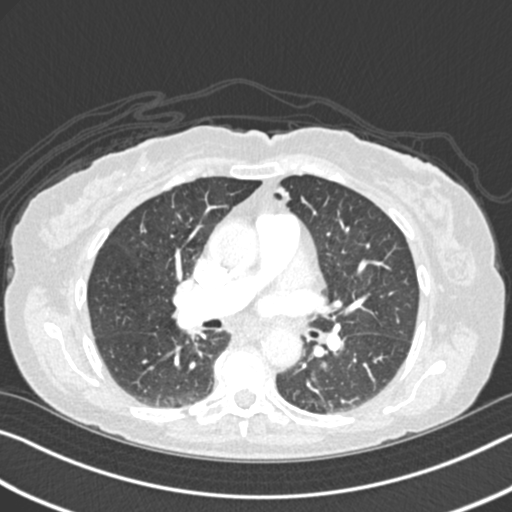
[im 178/282  mediastinal]
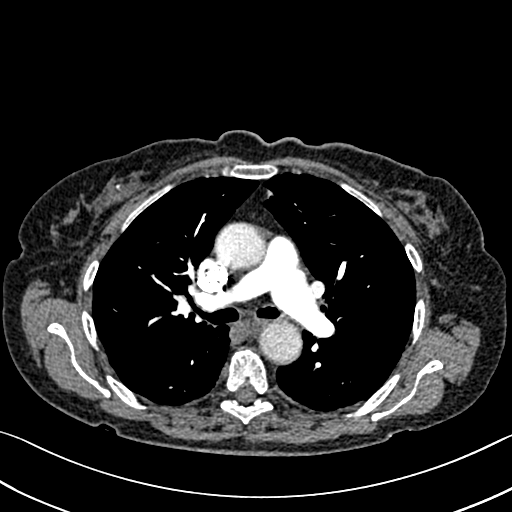
[im 193/282  lung]
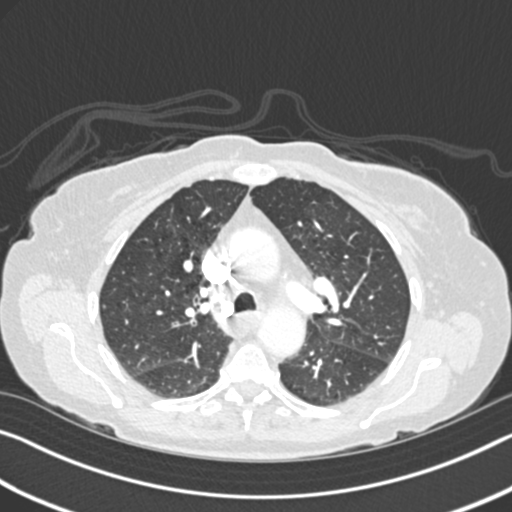
[im 208/282  mediastinal]
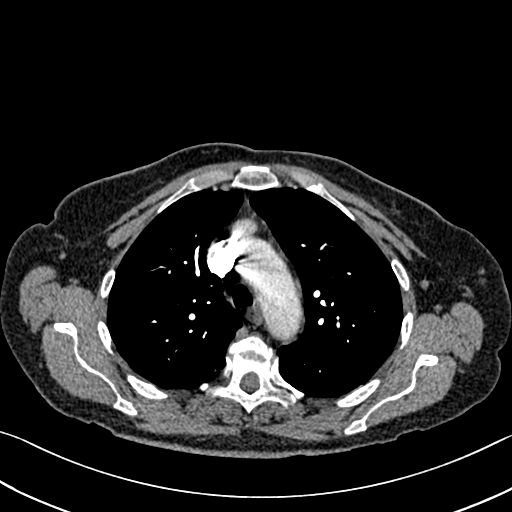
[im 237/282  lung]
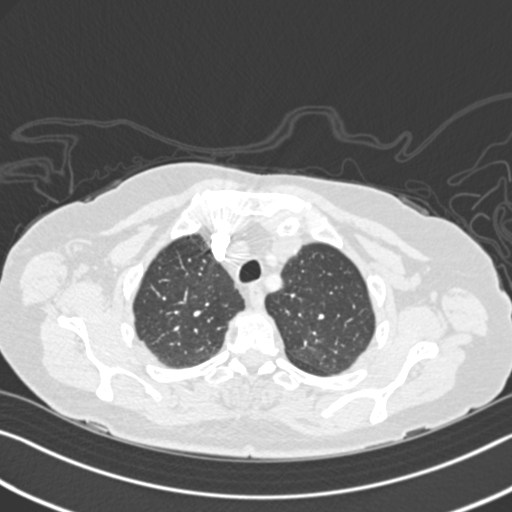
[im 252/282  mediastinal]
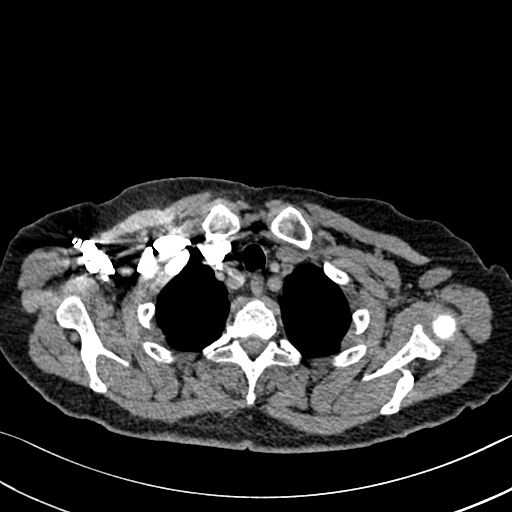
[im 267/282  lung]
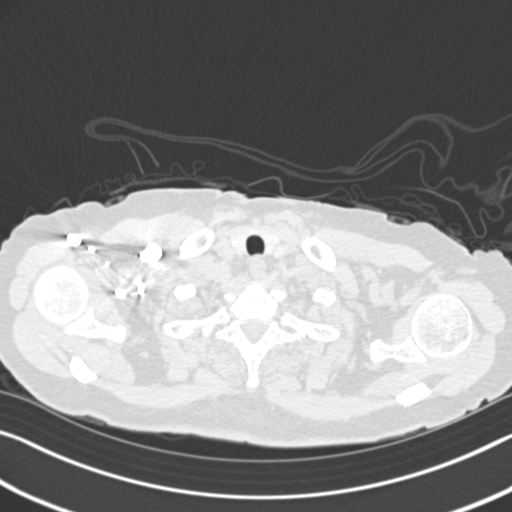

[Series 8: pe coronal mpr · coronal · 0.57mm/px · 1 of 106 slices shown]
[im 53/106  mediastinal]
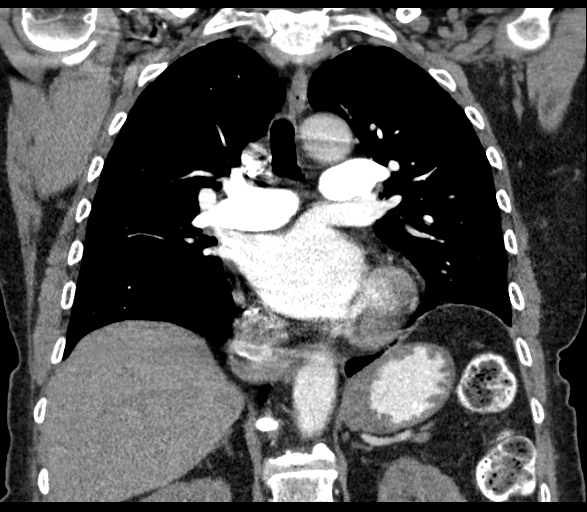

[16 of 36 positions shown; findings below may reference images not displayed]

RADIATION DOSE REDUCTION: This exam was performed according to the
departmental dose-optimization program which includes automated
exposure control, adjustment of the mA and/or kV according to
patient size and/or use of iterative reconstruction technique.

CONTRAST:  100mL OMNIPAQUE IOHEXOL 350 MG/ML SOLN
FINDINGS: CTA CHEST FINDINGS

Cardiovascular: The pulmonary arteries are well opacified with
contrast to the level of the subsegmental branches. There is no
evidence of recurrent acute pulmonary embolism. No residual filling
defects are seen within the right lower lobe pulmonary artery.
Atherosclerosis of the aorta, great vessels and coronary arteries
again noted without acute vascular findings. The heart size is
normal. There is no pericardial effusion.

Mediastinum/Nodes: There are no enlarged mediastinal, hilar or
axillary lymph nodes. The thyroid gland, trachea and esophagus
demonstrate no significant findings.

Lungs/Pleura: No pleural effusion or pneumothorax. Stable mild
central airway thickening and mild dependent atelectasis at both
lung bases. No suspicious pulmonary nodularity or confluent airspace
opacity. Previously demonstrated 4 mm left lower lobe nodule on
image 58/5 is unchanged.

Musculoskeletal/Chest wall: There is no chest wall mass or
suspicious osseous finding. Grossly stable nodularity and
calcifications within the breasts. Mild thoracic spondylosis.

Review of the MIP images confirms the above findings.

CT ABDOMEN and PELVIS FINDINGS

Hepatobiliary: The liver is normal in density without suspicious
focal abnormality. No evidence of gallstones, gallbladder wall
thickening or biliary dilatation.

Pancreas: Unremarkable. No pancreatic ductal dilatation or
surrounding inflammatory changes.

Spleen: Normal in size without focal abnormality.

Adrenals/Urinary Tract: Stable 1.5 cm right adrenal nodule measuring
35 HU, consistent with an adenoma based on stability. The left
adrenal gland appears normal. Both kidneys appear normal. No
evidence of urinary tract calculus, hydronephrosis or perinephric
soft tissue stranding. No collecting system abnormalities are
identified status post cystectomy and ileal conduit formation in the
right lower quadrant.

Stomach/Bowel: Enteric contrast was administered and has passed into
the distal colon. The stomach appears unremarkable for its degree of
distension. No evidence of bowel wall thickening, distention or
surrounding inflammatory change. Mild descending colonic
diverticulosis.

Vascular/Lymphatic: There are no enlarged abdominal or pelvic lymph
nodes. Stable retroperitoneal surgical clips from previous pelvic
lymphadenectomy. Mild aortic and branch vessel atherosclerosis. No
acute vascular findings or aneurysm.

Reproductive: Hysterectomy. No evidence of adnexal mass. The lower
pelvis is partly obscured by artifact from the left total hip
arthroplasty.

Other: Postsurgical changes in the low anterior abdominal wall with
a small periumbilical hernia containing only fat. No ascites or
peritoneal nodularity.

Musculoskeletal: No acute or significant osseous findings. Mild
lumbar spondylosis. Previous left total hip arthroplasty.

Review of the MIP images confirms the above findings.
IMPRESSION: 1. No evidence of recurrent acute pulmonary embolism. Previously
demonstrated small nonocclusive filling defect in the right lower
lobe pulmonary nodule artery is no longer visualized.
2. No acute chest findings or evidence of metastatic disease.
3. Stable postsurgical changes from cystectomy and urinary
diversion. No evidence of metastatic disease in the abdomen or
pelvis.
4. Coronary and Aortic Atherosclerosis ([id number]-GRI.I).
5. Stable right adrenal adenoma.

## 2023-01-31 DIAGNOSIS — K9409 Other complications of colostomy: Secondary | ICD-10-CM | POA: Diagnosis not present

## 2023-01-31 DIAGNOSIS — C7911 Secondary malignant neoplasm of bladder: Secondary | ICD-10-CM | POA: Diagnosis not present

## 2023-01-31 DIAGNOSIS — Z936 Other artificial openings of urinary tract status: Secondary | ICD-10-CM | POA: Diagnosis not present

## 2023-03-19 IMAGING — MG MM DIGITAL SCREENING BILAT W/ TOMO AND CAD
6 of 10 series · 6 of 30 positions shown · non-contrast
Comparison: Previous exam(s).

CLINICAL DATA: Screening.

EXAM:
DIGITAL SCREENING BILATERAL MAMMOGRAM WITH TOMOSYNTHESIS AND CAD
TECHNIQUE: Bilateral screening digital craniocaudal and mediolateral oblique
mammograms were obtained. Bilateral screening digital breast
tomosynthesis was performed. The images were evaluated with
computer-aided detection.

[L CC synth-2D]
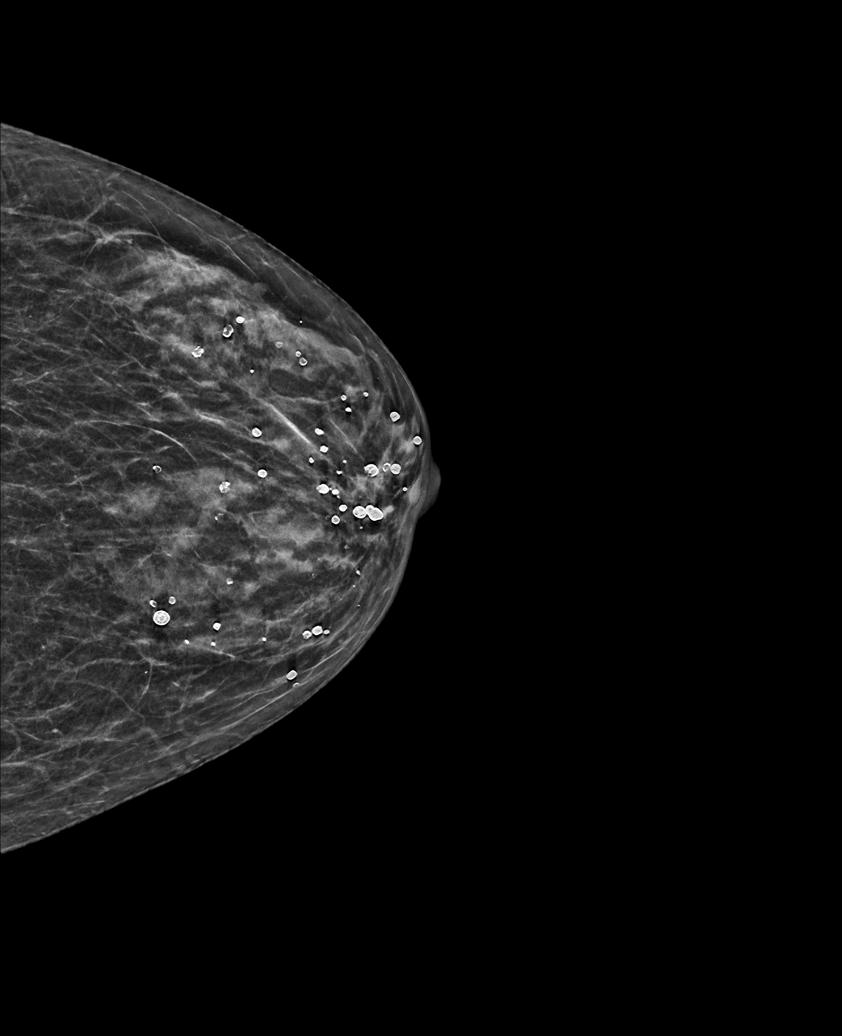

[R MLO synth-2D (1 of 2)]
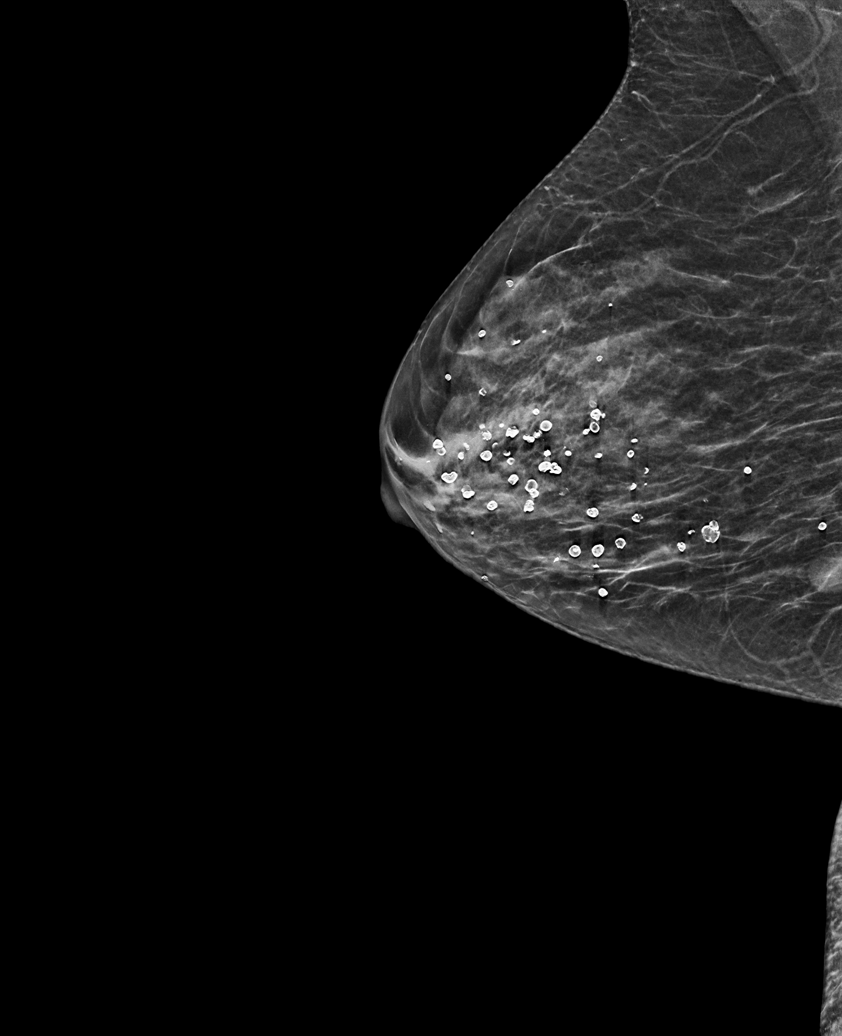

[R MLO synth-2D (2 of 2)]
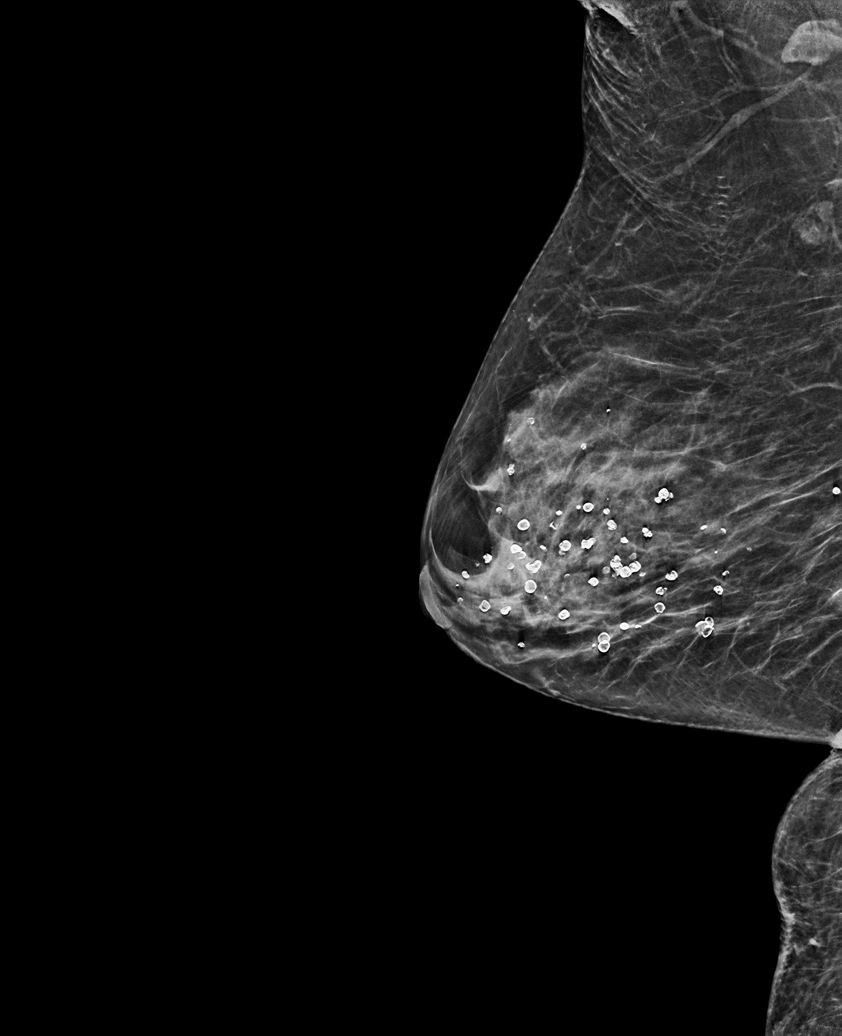

[L MLO synth-2D]
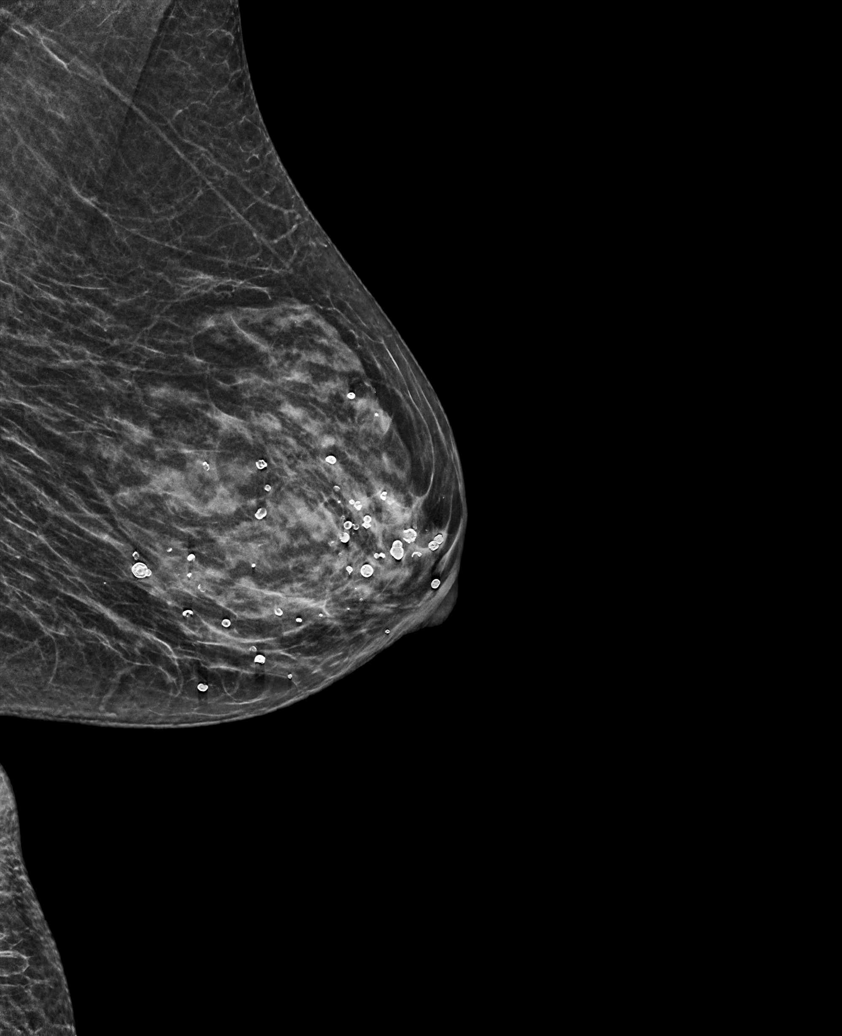

[R CC synth-2D]
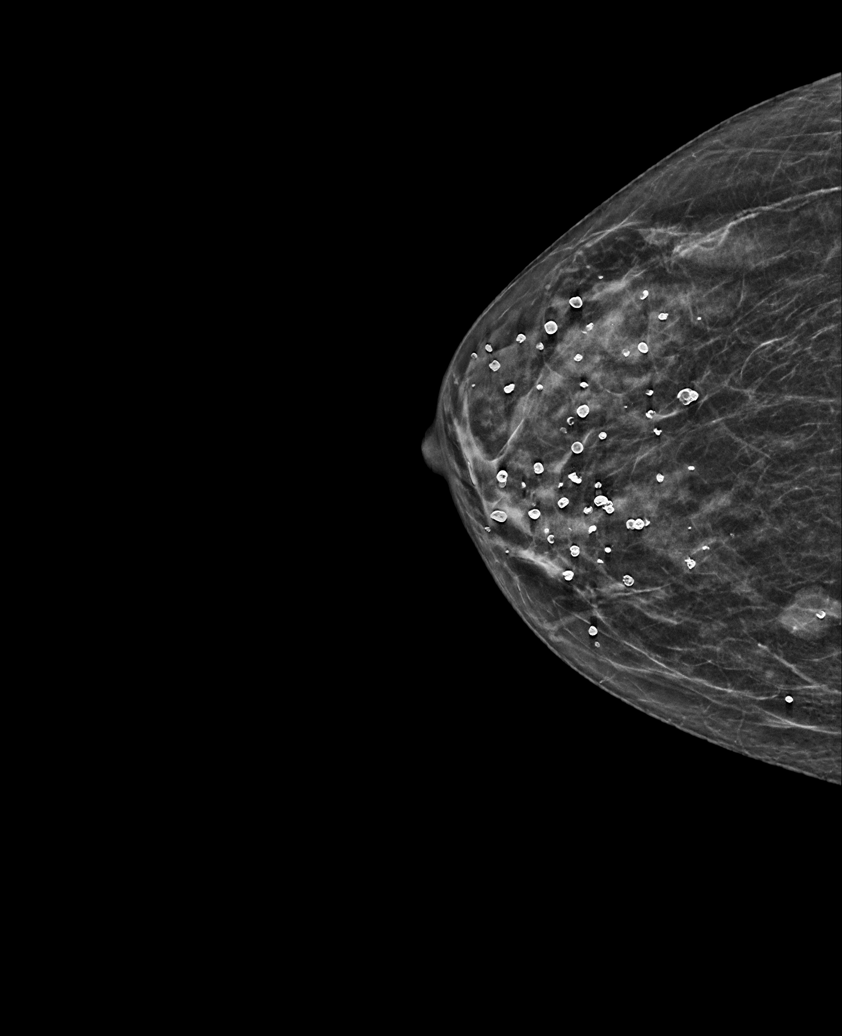

[R MLO tomo · tomo slice 27/53.0]
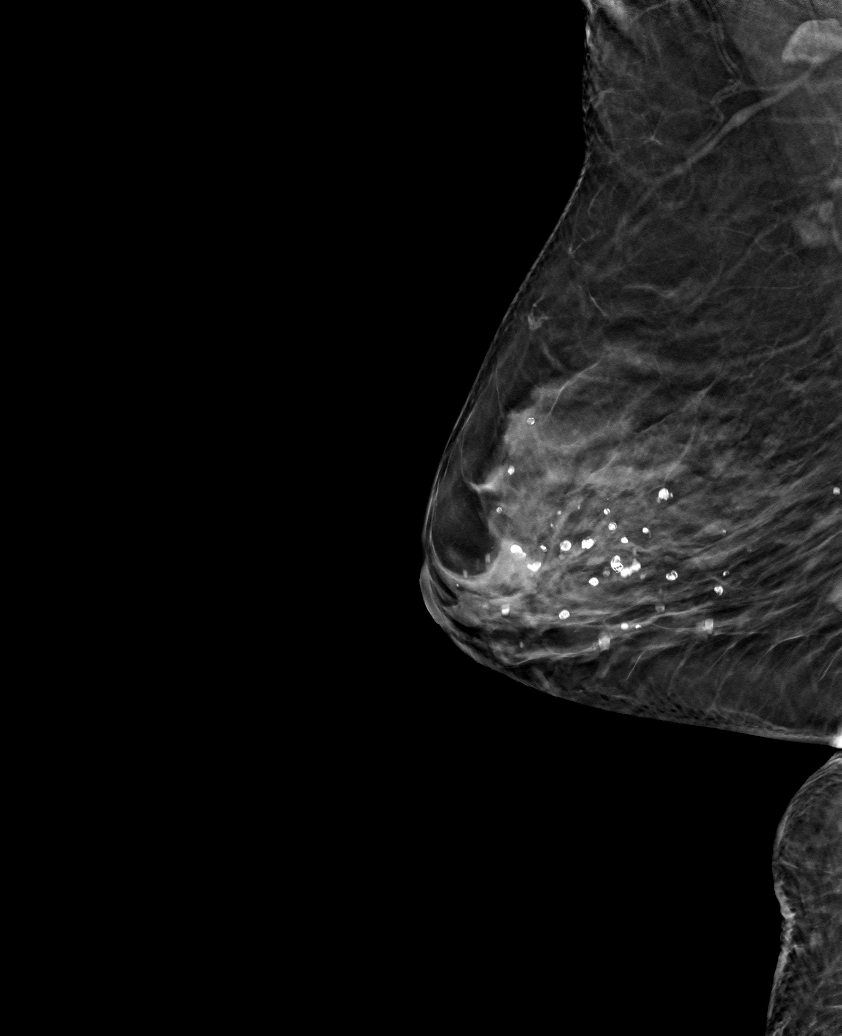

[6 of 30 positions shown; findings below may reference images not displayed]

ACR Breast Density Category c: The breast tissue is heterogeneously
dense, which may obscure small masses.
FINDINGS: There are no findings suspicious for malignancy.
IMPRESSION: No mammographic evidence of malignancy. A result letter of this
screening mammogram will be mailed directly to the patient.

RECOMMENDATION:
Screening mammogram in one year. (Code:Q3-W-BC3)

BI-RADS CATEGORY  1: Negative.

## 2023-03-27 ENCOUNTER — Encounter (INDEPENDENT_AMBULATORY_CARE_PROVIDER_SITE_OTHER): Payer: Self-pay

## 2023-04-16 ENCOUNTER — Ambulatory Visit (INDEPENDENT_AMBULATORY_CARE_PROVIDER_SITE_OTHER): Payer: Medicare PPO | Admitting: Family Medicine

## 2023-04-16 DIAGNOSIS — E559 Vitamin D deficiency, unspecified: Secondary | ICD-10-CM

## 2023-04-16 DIAGNOSIS — F418 Other specified anxiety disorders: Secondary | ICD-10-CM

## 2023-04-16 DIAGNOSIS — D6869 Other thrombophilia: Secondary | ICD-10-CM

## 2023-04-16 DIAGNOSIS — R7309 Other abnormal glucose: Secondary | ICD-10-CM

## 2023-04-16 DIAGNOSIS — M810 Age-related osteoporosis without current pathological fracture: Secondary | ICD-10-CM

## 2023-04-16 DIAGNOSIS — Z91199 Patient's noncompliance with other medical treatment and regimen due to unspecified reason: Secondary | ICD-10-CM

## 2023-04-16 HISTORY — DX: Other thrombophilia: D68.69

## 2023-04-16 NOTE — Patient Instructions (Signed)

## 2023-04-16 NOTE — Progress Notes (Signed)
No show

## 2023-04-17 ENCOUNTER — Ambulatory Visit: Payer: Medicare PPO | Admitting: Family Medicine

## 2023-04-19 ENCOUNTER — Ambulatory Visit: Payer: Medicare PPO | Admitting: Family Medicine

## 2023-04-21 IMAGING — CT CT CHEST-ABD-PELV W/ CM
2 of 5 series · 13 of 36 positions shown, 15 images · IV contrast (Omnipaque)
Comparison: CT angiogram chest, abdomen pelvis, 09/26/2021

CLINICAL DATA: Bladder cancer, status post cystectomy and right
lower quadrant ileal conduit urinary diversion * Tracking Code: BO *

EXAM:
CT CHEST, ABDOMEN, AND PELVIS WITH CONTRAST
TECHNIQUE: Multidetector CT imaging of the chest, abdomen and pelvis was
performed following the standard protocol during bolus
administration of intravenous contrast.

[Series 2: cap with 2 · axial · 0.86mm/px · z∈[-614,-74]mm · 10 of 134 slices shown, 12 images]
[im 13/134  mediastinal]
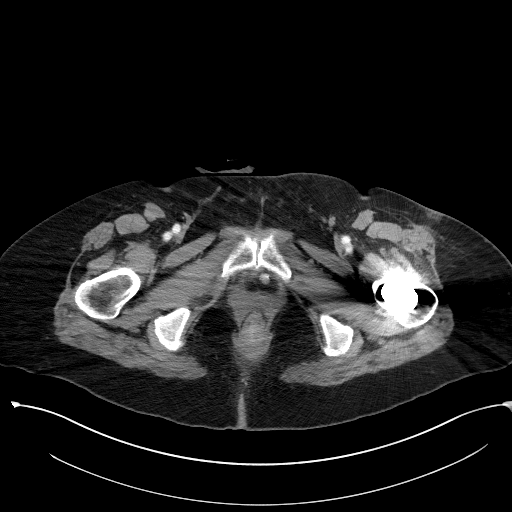
[im 13/134  bone]
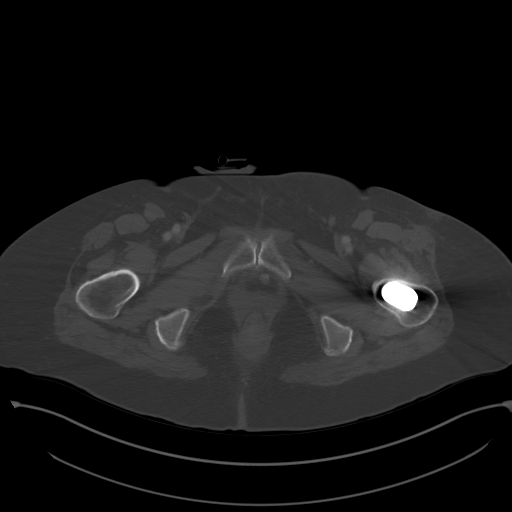
[im 25/134  mediastinal]
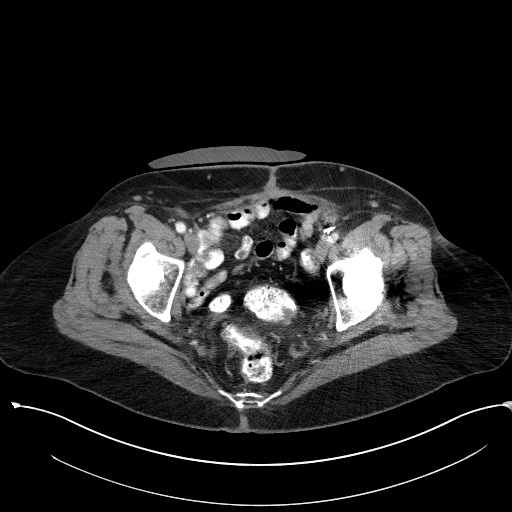
[im 37/134  mediastinal]
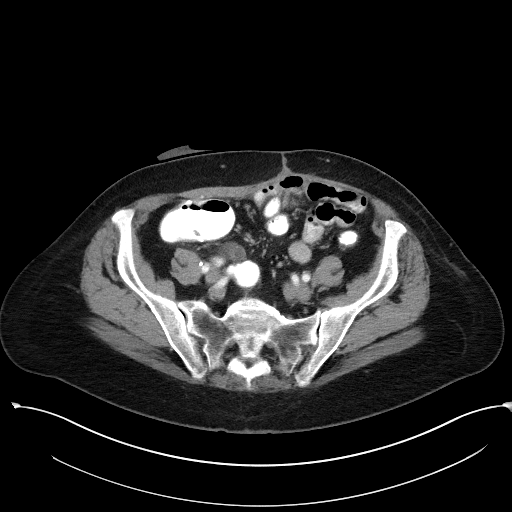
[im 49/134  mediastinal]
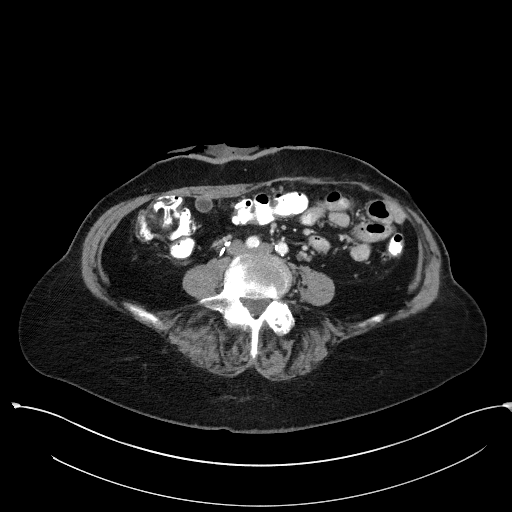
[im 61/134  mediastinal]
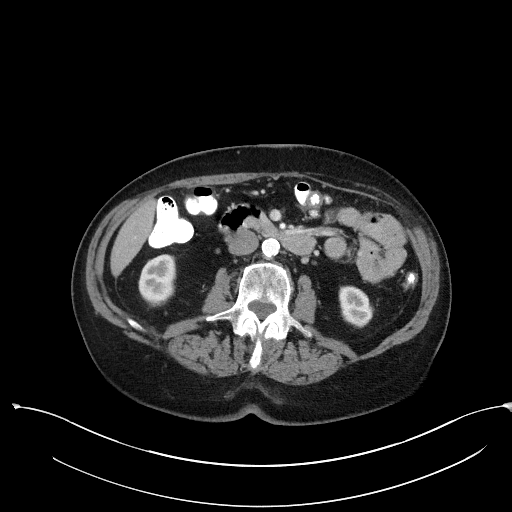
[im 73/134  mediastinal]
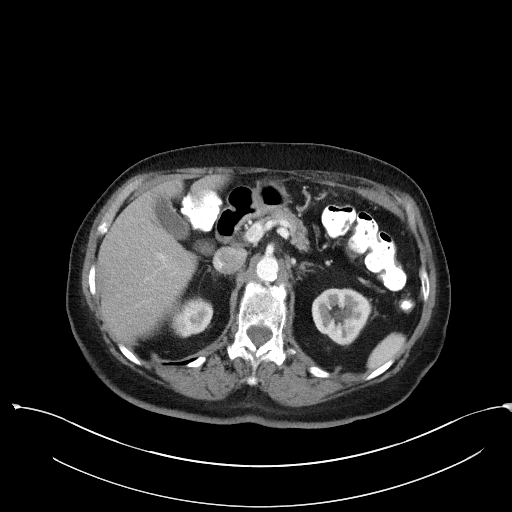
[im 85/134  mediastinal]
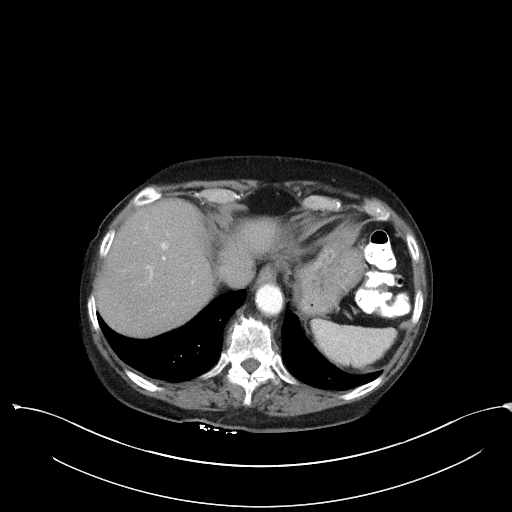
[im 97/134  mediastinal]
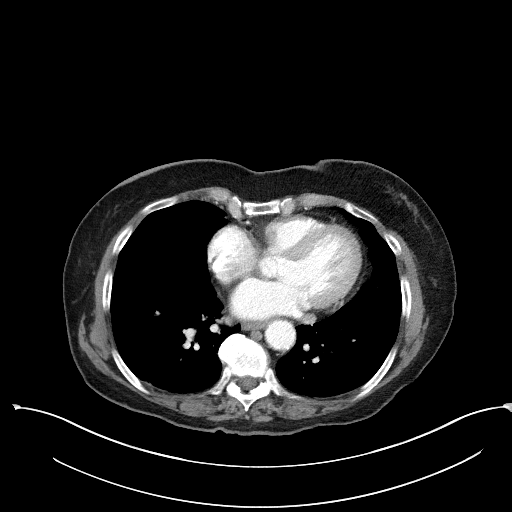
[im 109/134  mediastinal]
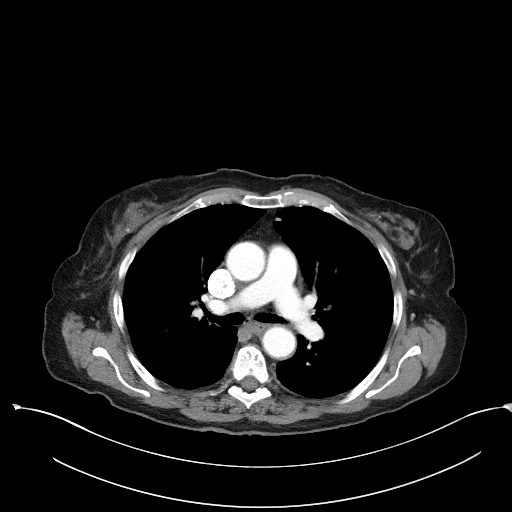
[im 109/134  bone]
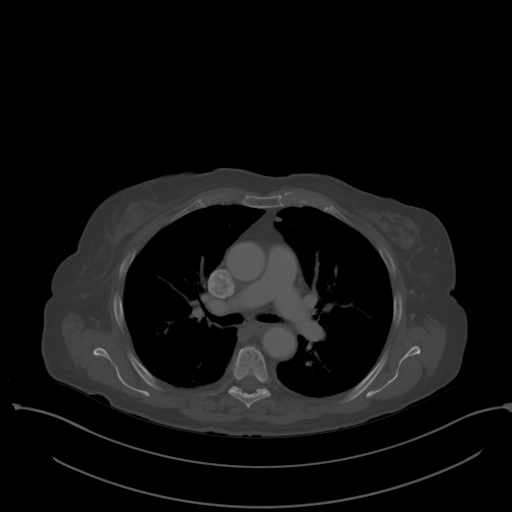
[im 121/134  mediastinal]
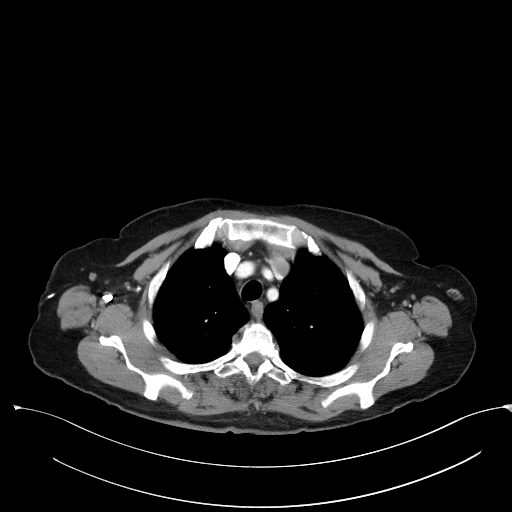

[Series 5: coronals · coronal · 0.83mm/px · 3 of 132 slices shown]
[im 27/132  mediastinal]
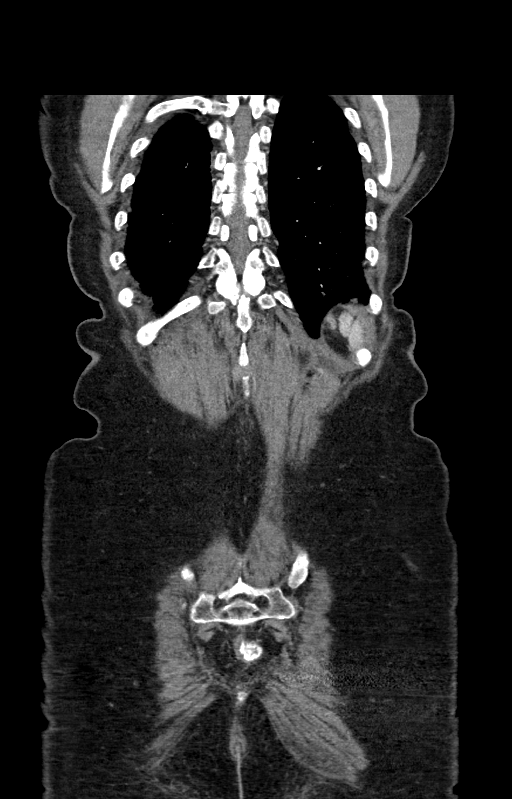
[im 53/132  mediastinal]
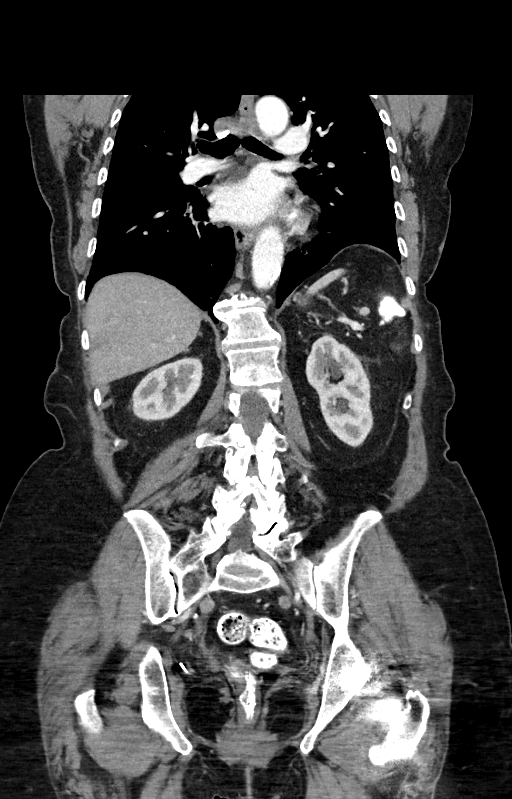
[im 79/132  mediastinal]
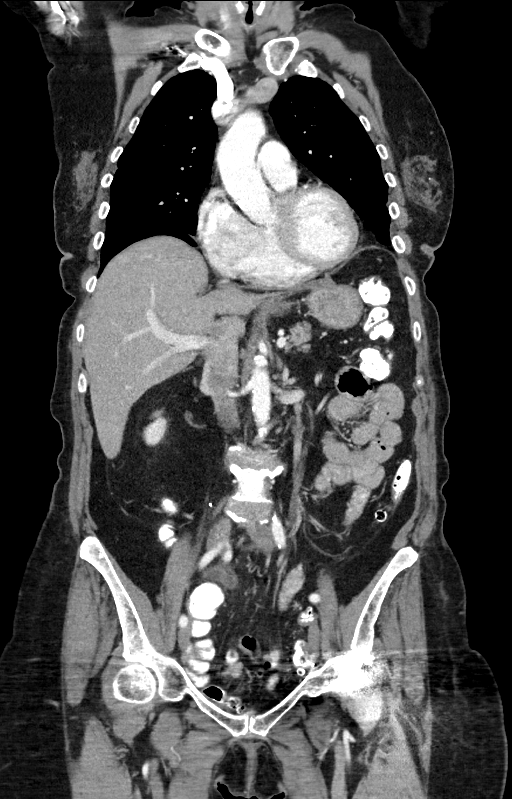

[13 of 36 positions shown; findings below may reference images not displayed]

RADIATION DOSE REDUCTION: This exam was performed according to the
departmental dose-optimization program which includes automated
exposure control, adjustment of the mA and/or kV according to
patient size and/or use of iterative reconstruction technique.

CONTRAST:  100mL OMNIPAQUE IOHEXOL 300 MG/ML SOLN, additional oral
enteric contrast
FINDINGS: CT CHEST FINDINGS

Cardiovascular: Aortic atherosclerosis. Normal heart size. Scattered
left coronary artery calcifications. No pericardial effusion.

Mediastinum/Nodes: No enlarged mediastinal, hilar, or axillary lymph
nodes. Thyroid gland, trachea, and esophagus demonstrate no
significant findings.

Lungs/Pleura: Unchanged 0.4 cm nodule of the dependent left lower
lobe (series 4, image 103). Additional tiny bilateral pulmonary
nodules, for example a 0.2 cm nodule of the peripheral right upper
lobe (series 4, image 39). No pleural effusion or pneumothorax.

Musculoskeletal: No chest wall mass or suspicious osseous lesions
identified.

CT ABDOMEN PELVIS FINDINGS

Hepatobiliary: No solid liver abnormality is seen. No gallstones,
gallbladder wall thickening, or biliary dilatation.

Pancreas: Unremarkable. No pancreatic ductal dilatation or
surrounding inflammatory changes.

Spleen: Normal in size without significant abnormality.

Adrenals/Urinary Tract: Stable, definitively benign right adrenal
adenoma (series 2, image 59). Status post cystectomy and right lower
quadrant ileal conduit urinary diversion. Kidneys are normal,
without renal calculi, solid lesion, or hydronephrosis.

Stomach/Bowel: Stomach is within normal limits. Appendix is not
clearly visualized and may be surgically absent. No evidence of
bowel wall thickening, distention, or inflammatory changes. Sigmoid
diverticula.

Vascular/Lymphatic: Aortic atherosclerosis. No enlarged abdominal or
pelvic lymph nodes.

Reproductive: Status post hysterectomy.

Other: No abdominal wall hernia or abnormality. No ascites.

Musculoskeletal: No acute osseous findings. Status post left hip
total arthroplasty.
IMPRESSION: 1. Status post cystectomy and right lower quadrant ileal conduit
urinary diversion.
2. No evidence of lymphadenopathy or metastatic disease in the
chest, abdomen, or pelvis.
3. Unchanged small pulmonary nodules measuring 0.4 cm and smaller,
almost certainly benign and incidental, metastatic disease not
favored. Attention on follow-up.
4. Coronary artery disease.

Aortic Atherosclerosis (DS8C3-PEQ.Q).

## 2023-04-23 ENCOUNTER — Ambulatory Visit: Payer: Medicare PPO | Admitting: Family Medicine

## 2023-04-23 ENCOUNTER — Encounter: Payer: Self-pay | Admitting: Family Medicine

## 2023-04-23 VITALS — BP 117/64 | HR 70 | Temp 98.4°F | Ht 64.0 in | Wt 166.8 lb

## 2023-04-23 DIAGNOSIS — E559 Vitamin D deficiency, unspecified: Secondary | ICD-10-CM

## 2023-04-23 DIAGNOSIS — D6869 Other thrombophilia: Secondary | ICD-10-CM | POA: Diagnosis not present

## 2023-04-23 DIAGNOSIS — M810 Age-related osteoporosis without current pathological fracture: Secondary | ICD-10-CM | POA: Diagnosis not present

## 2023-04-23 DIAGNOSIS — F418 Other specified anxiety disorders: Secondary | ICD-10-CM | POA: Diagnosis not present

## 2023-04-23 DIAGNOSIS — Z8551 Personal history of malignant neoplasm of bladder: Secondary | ICD-10-CM

## 2023-04-23 DIAGNOSIS — M47817 Spondylosis without myelopathy or radiculopathy, lumbosacral region: Secondary | ICD-10-CM

## 2023-04-23 DIAGNOSIS — Z86711 Personal history of pulmonary embolism: Secondary | ICD-10-CM

## 2023-04-23 DIAGNOSIS — R1319 Other dysphagia: Secondary | ICD-10-CM | POA: Diagnosis not present

## 2023-04-23 DIAGNOSIS — Z2821 Immunization not carried out because of patient refusal: Secondary | ICD-10-CM

## 2023-04-23 MED ORDER — LORAZEPAM 0.5 MG PO TABS: 0.5000 mg | ORAL_TABLET | Freq: Two times a day (BID) | ORAL | 5 refills | Status: DC | PRN

## 2023-04-23 MED ORDER — PAROXETINE HCL 20 MG PO TABS: 20.0000 mg | ORAL_TABLET | Freq: Every day | ORAL | 1 refills | Status: DC

## 2023-04-23 NOTE — Progress Notes (Signed)
Patient ID: Christina [name], female  DOB: [birth year], 82 y.o.   MRN: [id number] Patient Care Team    Relationship Specialty Notifications Start End  Natalia Leatherwood, DO PCP - General Family Medicine  09/02/17   Emelia Loron, MD Consulting Physician General Surgery  10/05/16   Mateo Flow, MD Consulting Physician Ophthalmology  10/05/16   Carola Frost, DDS Consulting Physician Dentistry  10/05/16   Charna Elizabeth, MD Consulting Physician Gastroenterology  09/02/17   Josph Macho, MD Medical Oncologist Oncology  05/14/19   Sheran Luz, MD Consulting Physician Physical Medicine and Rehabilitation  01/12/21   Judi Saa, DO Consulting Physician Sports Medicine  01/12/21   Noel Christmas, MD Consulting Physician Urology  10/30/22     Chief Complaint  Patient presents with   Follow-up    Follow up on chronic conditions. She has issues with being tired and also having trouble with choking. Declined flu shot.    Subjective: Christina [name] is a 82 y.o. female present for Chronic Conditions/illness Management All past medical history, surgical history, allergies, family history, immunizations, medications and social history were updated in the electronic medical record today. All recent labs, ED visits and hospitalizations within the last year were reviewed.  Situational anxiety:  Patient report she is feeling well and compliant with ativan 0.5 mg BID prn and paxil 20 mg qd. She rarely uses the ativan- only if she can not sleep. Has filled one 30d script in 6 mos and has some left in bottle.  Prior note: Was started on Paxil by her oncology team at the onset of diagnosis of bladder cancer. She does feel that it has been helpful along with the Ativan 0.5 mg twice daily. She reports she had a provider tell her that Zoloft is better for her than Paxil and she wanted to try zoloft. Switch was made last appt and she feels paxil was a better fit for her. She does have trouble sleeping  some nights. She reports ativan  Mg qhs is usually helpful but sometimes she requires additional coverage.    Lumbar degeneration/low back pain: Patient reports she is having trouble with her low back and it hurts every day.  She reports the back pain never improved now.  She is established with Dr. Horald Chestnut, last seen 05/2022  Dysphagia Patient reports she had 2 events recently in which she had difficulty swallowing.  She states she got "choked.  "Her family became concerned and felt she needed the Heimlich maneuver.  Patient reports she feels like she is chewing and swallowing the food appropriately, then it gets stuck in her throat on the way down.     04/23/2023   11:03 AM 01/09/2023    1:24 PM 08/17/2022    9:09 AM 04/10/2022   11:13 AM 01/08/2022    3:05 PM  Depression screen PHQ 2/9  Decreased Interest 2 0 0 1 0  Down, Depressed, Hopeless 1 0 0 1 1  PHQ - 2 Score 3 0 0 2 1  Altered sleeping 3  0 3   Tired, decreased energy 3  0 3   Change in appetite 0  0 0   Feeling bad or failure about yourself  1  0 0   Trouble concentrating 3  0 1   Moving slowly or fidgety/restless 2  0 1   Suicidal thoughts 0  0 0   PHQ-9 Score 15  0 10   Difficult doing work/chores Somewhat  difficult  Not difficult at all        04/23/2023   11:03 AM 04/10/2022   11:14 AM 10/24/2021   10:54 AM 04/25/2021   11:40 AM  GAD 7 : Generalized Anxiety Score  Nervous, Anxious, on Edge 0 1 0 1  Control/stop worrying 1 1 0 0  Worry too much - different things 1 1 1  0  Trouble relaxing 0 1 0 0  Restless 0 3 1 0  Easily annoyed or irritable 1 1 1 1   Afraid - awful might happen 0 2 1 1   Total GAD 7 Score 3 10 4 3   Anxiety Difficulty Not difficult at all             04/23/2023   11:03 AM 08/17/2022    9:10 AM 01/08/2022    3:06 PM 01/03/2022    1:41 PM 10/24/2021   10:31 AM  Fall Risk   Falls in the past year? 0 0 1 1 1   Number falls in past yr: 0  0 1 0  Injury with Fall? 0  0 0 0  Risk for fall due to :  No Fall Risks No Fall Risks   No Fall Risks  Follow up Falls evaluation completed Falls evaluation completed  Falls prevention discussed Falls evaluation completed   Immunization History  Administered Date(s) Administered   Influenza Whole 06/07/2009, 05/28/2011, 05/28/2012   Influenza, High Dose Seasonal PF 05/20/2015   Influenza,inj,Quad PF,6+ Mos 05/26/2013, 05/31/2014   Moderna Sars-Covid-2 Vaccination 12/11/2019, 02/12/2020, 09/27/2020   Pneumococcal Conjugate-13 05/31/2014   Pneumococcal Polysaccharide-23 06/07/2009   Td 01/31/2003   Td (Adult), 2 Lf Tetanus Toxid, Preservative Free 01/31/2003   Tdap 09/28/2011, 10/24/2021   Past Medical History:  Diagnosis Date   Anal fissure 10/24/2021   Arthritis    Asthma    a little?, no problems in several years   Bladder cancer (HCC)    Bladder tumor    Cervical cancer screening 01/31/2012   Chicken pox as a child   Chronic idiopathic constipation 10/24/2021   Degenerative tear of acetabular labrum of left hip 02/26/2019   Dehydration 01/19/2020   Depression with anxiety 06/07/2009   Qualifier: Diagnosis of  By: Linford Arnold MD, Catherine     Dermatitis of external ear 07/11/2012   Diverticulosis    Essential hypertension, benign 10/13/2007   Qualifier: Diagnosis of  By: Linford Arnold MD, Catherine     Ganglion cyst 02/26/2019   Right elbow   Hiatal hernia with gastroesophageal reflux 10/26/2010   Qualifier: Diagnosis of  By: Linford Arnold MD, Catherine     Hyperglycemia 06/21/2013   Hyperlipidemia, mixed 10/16/2015   pt unaware   Hypertension    Left hip pain 07/10/2015   Lesion of breast 12/03/2016   benign; resolved   Low back pain 05/29/2015   Measles as a child   Mumps as a child   Osteopenia 01/03/2017   Overweight (BMI 25.0-29.9) 10/07/2008   Qualifier: Diagnosis of  By: Linford Arnold MD, Santina Evans     Palpitations    several years ago, not currently   Pre-diabetes    Psoriasis    ears   Rectal bleeding 10/24/2021   RUQ pain  05/29/2015   Spider veins    Bilateral legs   Spinal stenosis    Tailor's bunionette, left 09/02/2017   Umbilical hernia    Urinary frequency 09/28/2011   Vertigo    Wears dentures    Upper,   Allergies  Allergen Reactions  Nutrasweet Aspartame [Aspartame] Diarrhea   Lisinopril Cough   Past Surgical History:  Procedure Laterality Date   ABDOMINAL SURGERY  1970's   BREAST BIOPSY Right 1980   BREAST EXCISIONAL BIOPSY   BREAST BIOPSY Left 1970   BREAST EXCISIONAL BIOPSY   BREAST CYST ASPIRATION     CATARACT EXTRACTION, BILATERAL     COLONOSCOPY     CYSTECTOMY     abdomen   CYSTOSCOPY W/ RETROGRADES Bilateral 04/27/2019   Procedure: CYSTOSCOPY WITH RETROGRADE PYELOGRAM;  Surgeon: Malen Gauze, MD;  Location: [facility];  Service: Urology;  Laterality: Bilateral;   EYE SURGERY Bilateral    cataract   INSERTION OF MESH N/A 06/11/2017   Procedure: INSERTION OF MESH;  Surgeon: Emelia Loron, MD;  Location: [facility] OR;  Service: General;  Laterality: N/A;  BILATERAL TAP BLOCK   IR REMOVAL TUN ACCESS W/ PORT W/O FL MOD SED  11/09/2020   TRANSURETHRAL RESECTION OF BLADDER TUMOR N/A 04/27/2019   Procedure: TRANSURETHRAL RESECTION OF BLADDER TUMOR (TURBT);  Surgeon: Malen Gauze, MD;  Location: [facility];  Service: Urology;  Laterality: N/A;  1 HR   TRANSURETHRAL RESECTION OF BLADDER TUMOR N/A 06/01/2019   Procedure: TRANSURETHRAL RESECTION OF BLADDER TUMOR (TURBT);  Surgeon: Malen Gauze, MD;  Location: [facility];  Service: Urology;  Laterality: N/A;  1 HR   TRANSURETHRAL RESECTION OF BLADDER TUMOR N/A 10/22/2019   Procedure: TRANSURETHRAL RESECTION OF BLADDER TUMOR (TURBT);  Surgeon: Malen Gauze, MD;  Location: [facility];  Service: Urology;  Laterality: N/A;   TUBAL LIGATION  1970   VENTRAL HERNIA REPAIR N/A 06/11/2017   Procedure: LAPAROSCOPIC VENTRAL HERNIA REPAIR WITH MESH ERAS PATHWAY;   Surgeon: Emelia Loron, MD;  Location: [facility] OR;  Service: General;  Laterality: N/A;  BILATERAL TAP BLOCK   Family History  Problem Relation Age of Onset   Hypertension Mother    Anxiety disorder Mother    Ovarian cancer Mother 60       lung- smoker, ovarian   Lung cancer Mother        smoker   Arthritis Mother    Hyperlipidemia Mother    Alcohol abuse Father    Diabetes Sister    Hypertension Sister    Ovarian cancer Sister    Arthritis Sister    Depression Sister    Hyperlipidemia Sister    Asthma Sister    Depression Sister    Hyperlipidemia Sister    Breast cancer Sister    Asthma Sister    Depression Sister    Arthritis Brother    COPD Brother    Heart attack Brother    Hyperlipidemia Brother    Stroke Brother    Prostate cancer Brother    Other Maternal Grandmother        pacemaker   Multiple sclerosis Maternal Grandfather        ?   Asthma Paternal Uncle    Social History   Social History Narrative   Retired. Lives alone.    Attended some business college.    Former smoker.    Smoke alarm in the home. Wears seat balt.    Wears dentures.    Feels safe in her relationships.        Allergies as of 04/23/2023       Reactions   Nutrasweet Aspartame [aspartame] Diarrhea   Lisinopril Cough        Medication List  Accurate as of April 23, 2023 12:20 PM. If you have any questions, ask your nurse or doctor.          STOP taking these medications    polyethylene glycol 17 g packet Commonly known as: MIRALAX / GLYCOLAX Stopped by: Felix Pacini       TAKE these medications    LORazepam 0.5 MG tablet Commonly known as: ATIVAN Take 1 tablet (0.5 mg total) by mouth 2 (two) times daily as needed for anxiety.   PARoxetine 20 MG tablet Commonly known as: PAXIL Take 1 tablet (20 mg total) by mouth daily.   VITAMIN D PO Take 1,000 Int'l Units/1.94m2 by mouth.   Xarelto 10 MG Tabs tablet Generic drug: rivaroxaban TAKE 1 TABLET BY  MOUTH EVERY DAY       All past medical history, surgical history, allergies, family history, immunizations andmedications were updated in the EMR today and reviewed under the history and medication portions of their EMR.       ROS 14 pt review of systems performed and negative (unless mentioned in an HPI)  Objective: BP 117/64   Pulse 70   Temp 98.4 F (36.9 C) (Temporal)   Ht 5\' 4"  (1.626 m)   Wt 166 lb 12.8 oz (75.7 kg)   SpO2 98%   BMI 28.63 kg/m  Physical Exam Vitals and nursing note reviewed.  Constitutional:      General: She is not in acute distress.    Appearance: Normal appearance. She is normal weight. She is not ill-appearing or toxic-appearing.  HENT:     Head: Normocephalic and atraumatic.  Eyes:     General: No scleral icterus.       Right eye: No discharge.        Left eye: No discharge.     Extraocular Movements: Extraocular movements intact.     Conjunctiva/sclera: Conjunctivae normal.     Pupils: Pupils are equal, round, and reactive to light.  Skin:    Findings: No rash.  Neurological:     Mental Status: She is alert and oriented to person, place, and time. Mental status is at baseline.     Motor: No weakness.     Coordination: Coordination normal.     Gait: Gait normal.  Psychiatric:        Mood and Affect: Mood normal.        Behavior: Behavior normal.        Thought Content: Thought content normal.        Judgment: Judgment normal.    No results found.  Assessment/plan: Christina [name] is a 82 y.o. female present for  Chronic Conditions/illness Management Osteoporosis/vit d def - VITAMIN D levels collected UTD 2024 - DG Bone Density due 11/2023  Situational anxiety Stable Continue  paxil 20 mg qd Continue  Ativan qhs prn- [facility]CC[facility] database reviewed.   Lumbar degeneration/back pain Encouraged her to call Dr. Horald Chestnut office and schedule a follow-up appointment.  Sounds like he wanted to see her back in a couple months from her October  appointment and if still having increasing discomfort he had plans to consider injection.  Acquired thrombophilia/h/o PE Xarelto prescribed by onc.  History of bladder cancer Stable Following with onc and uro  Stage 3a chronic kidney disease (HCC) - labs UTD 12/2019 GFR ~ 43 avg. Dysphagia Patient describes the 2 events of something getting stuck in her throat.  Seem to have been severe enough that family thought she needed Heimlich maneuver. We discussed  possible differential diagnoses including requiring an EGD or swallow study to further evaluate.  She is agreeable to referral back to her gastroenterology team.  Influenza vaccine declined   Return in about 6 months (around 10/31/2023) for cpe (20 min), Routine chronic condition follow-up.  Orders Placed This Encounter  Procedures   Ambulatory referral to Gastroenterology    Meds ordered this encounter  Medications   LORazepam (ATIVAN) 0.5 MG tablet    Sig: Take 1 tablet (0.5 mg total) by mouth 2 (two) times daily as needed for anxiety.    Dispense:  60 tablet    Refill:  5   PARoxetine (PAXIL) 20 MG tablet    Sig: Take 1 tablet (20 mg total) by mouth daily.    Dispense:  90 tablet    Refill:  1   Referral Orders         Ambulatory referral to Gastroenterology     Note is dictated utilizing voice recognition software. Although note has been proof read prior to signing, occasional typographical errors still can be missed. If any questions arise, please do not hesitate to call for verification.  Electronically signed by: Felix Pacini, DO [facility]

## 2023-04-23 NOTE — Patient Instructions (Addendum)
Return in about 6 months (around 10/31/2023) for cpe (20 min), Routine chronic condition follow-up.   Dr. Ethelene Hal - call him and be seen for evaluation of your back pain. It looks like he wanted you to follow up in a few months if pain is not better. He wanted to consider injections to help you with pain.         Great to see you today.  I have refilled the medication(s) we provide.   If labs were collected or images ordered, we will inform you of  results once we have received them and reviewed. We will contact you either by echart message, or telephone call.  Please give ample time to the testing facility, and our office to run,  receive and review results. Please do not call inquiring of results, even if you can see them in your chart. We will contact you as soon as we are able. If it has been over 1 week since the test was completed, and you have not yet heard from [place], then please call us.    - echart message- for normal results that have been seen by the patient already.   - telephone call: abnormal results or if patient has not viewed results in their echart.  If a referral to a specialist was entered for you, please call us in 2 weeks if you have not heard from the specialist office to schedule.

## 2023-05-02 DIAGNOSIS — R1013 Epigastric pain: Secondary | ICD-10-CM | POA: Diagnosis not present

## 2023-05-02 DIAGNOSIS — R1310 Dysphagia, unspecified: Secondary | ICD-10-CM | POA: Diagnosis not present

## 2023-05-02 DIAGNOSIS — K219 Gastro-esophageal reflux disease without esophagitis: Secondary | ICD-10-CM | POA: Diagnosis not present

## 2023-05-02 DIAGNOSIS — K5901 Slow transit constipation: Secondary | ICD-10-CM | POA: Diagnosis not present

## 2023-05-03 ENCOUNTER — Other Ambulatory Visit: Payer: Self-pay | Admitting: Gastroenterology

## 2023-05-03 DIAGNOSIS — R1310 Dysphagia, unspecified: Secondary | ICD-10-CM

## 2023-05-08 DIAGNOSIS — R8289 Other abnormal findings on cytological and histological examination of urine: Secondary | ICD-10-CM | POA: Diagnosis not present

## 2023-05-08 DIAGNOSIS — N3289 Other specified disorders of bladder: Secondary | ICD-10-CM | POA: Diagnosis not present

## 2023-05-08 DIAGNOSIS — Z8551 Personal history of malignant neoplasm of bladder: Secondary | ICD-10-CM | POA: Diagnosis not present

## 2023-05-09 ENCOUNTER — Ambulatory Visit
Admission: RE | Admit: 2023-05-09 | Discharge: 2023-05-09 | Disposition: A | Discharge status: Home / Self Care | Payer: Medicare PPO | Source: Ambulatory Visit | Attending: Gastroenterology | Admitting: Gastroenterology

## 2023-05-09 DIAGNOSIS — R1310 Dysphagia, unspecified: Secondary | ICD-10-CM | POA: Diagnosis not present

## 2023-05-10 DIAGNOSIS — Z936 Other artificial openings of urinary tract status: Secondary | ICD-10-CM | POA: Diagnosis not present

## 2023-05-10 DIAGNOSIS — C7911 Secondary malignant neoplasm of bladder: Secondary | ICD-10-CM | POA: Diagnosis not present

## 2023-05-10 DIAGNOSIS — K9409 Other complications of colostomy: Secondary | ICD-10-CM | POA: Diagnosis not present

## 2023-06-05 ENCOUNTER — Inpatient Hospital Stay: Payer: Medicare PPO | Admitting: Hematology & Oncology

## 2023-06-05 ENCOUNTER — Inpatient Hospital Stay: Payer: Medicare PPO | Attending: Hematology & Oncology

## 2023-06-05 ENCOUNTER — Encounter: Payer: Self-pay | Admitting: Hematology & Oncology

## 2023-06-05 ENCOUNTER — Other Ambulatory Visit: Payer: Self-pay

## 2023-06-05 VITALS — BP 151/62 | HR 73 | Temp 98.1°F | Resp 20 | Ht 65.0 in | Wt 167.1 lb

## 2023-06-05 DIAGNOSIS — Z79899 Other long term (current) drug therapy: Secondary | ICD-10-CM | POA: Diagnosis not present

## 2023-06-05 DIAGNOSIS — Z8551 Personal history of malignant neoplasm of bladder: Secondary | ICD-10-CM

## 2023-06-05 DIAGNOSIS — Z7901 Long term (current) use of anticoagulants: Secondary | ICD-10-CM | POA: Diagnosis not present

## 2023-06-05 DIAGNOSIS — C679 Malignant neoplasm of bladder, unspecified: Secondary | ICD-10-CM | POA: Diagnosis not present

## 2023-06-05 DIAGNOSIS — Z9221 Personal history of antineoplastic chemotherapy: Secondary | ICD-10-CM | POA: Insufficient documentation

## 2023-06-05 DIAGNOSIS — F418 Other specified anxiety disorders: Secondary | ICD-10-CM | POA: Diagnosis not present

## 2023-06-05 LAB — CMP (CANCER CENTER ONLY)
ALT: 6 U/L (ref 0–44)
AST: 15 U/L (ref 15–41)
Albumin: 3.8 g/dL (ref 3.5–5.0)
Alkaline Phosphatase: 74 U/L (ref 38–126)
Anion gap: 9 (ref 5–15)
BUN: 17 mg/dL (ref 8–23)
CO2: 27 mmol/L (ref 22–32)
Calcium: 10.2 mg/dL (ref 8.9–10.3)
Chloride: 104 mmol/L (ref 98–111)
Creatinine: 1.06 mg/dL — ABNORMAL HIGH (ref 0.44–1.00)
GFR, Estimated: 53 mL/min — ABNORMAL LOW (ref >60)
Glucose, Bld: 154 mg/dL — ABNORMAL HIGH (ref 70–99)
Potassium: 3.6 mmol/L (ref 3.5–5.1)
Sodium: 140 mmol/L (ref 135–145)
Total Bilirubin: 1.1 mg/dL (ref 0.3–1.2)
Total Protein: 6.6 g/dL (ref 6.5–8.1)

## 2023-06-05 LAB — TSH: TSH: 2.164 u[IU]/mL (ref 0.350–4.500)

## 2023-06-05 LAB — CBC WITH DIFFERENTIAL (CANCER CENTER ONLY)
Abs Immature Granulocytes: 0.02 10*3/uL (ref 0.00–0.07)
Basophils Absolute: 0 10*3/uL (ref 0.0–0.1)
Basophils Relative: 1 %
Eosinophils Absolute: 0.1 10*3/uL (ref 0.0–0.5)
Eosinophils Relative: 2 %
HCT: 41.7 % (ref 36.0–46.0)
Hemoglobin: 13.4 g/dL (ref 12.0–15.0)
Immature Granulocytes: 0 %
Lymphocytes Relative: 30 %
Lymphs Abs: 1.9 10*3/uL (ref 0.7–4.0)
MCH: 31.8 pg (ref 26.0–34.0)
MCHC: 32.1 g/dL (ref 30.0–36.0)
MCV: 98.8 fL (ref 80.0–100.0)
Monocytes Absolute: 0.4 10*3/uL (ref 0.1–1.0)
Monocytes Relative: 7 %
Neutro Abs: 3.9 10*3/uL (ref 1.7–7.7)
Neutrophils Relative %: 60 %
Platelet Count: 229 10*3/uL (ref 150–400)
RBC: 4.22 MIL/uL (ref 3.87–5.11)
RDW: 13.4 % (ref 11.5–15.5)
WBC Count: 6.4 10*3/uL (ref 4.0–10.5)
nRBC: 0 % (ref 0.0–0.2)

## 2023-06-05 LAB — LACTATE DEHYDROGENASE: LDH: 141 U/L (ref 98–192)

## 2023-06-05 MED ORDER — TRAMADOL HCL 50 MG PO TABS
50.0000 mg | ORAL_TABLET | Freq: Four times a day (QID) | ORAL | 0 refills | Status: DC | PRN
Start: 2023-06-05 — End: 2023-09-24

## 2023-06-05 NOTE — Progress Notes (Signed)
Hematology and Oncology Follow Up Visit  Christina [name] [id number] 1941/07/03 82 y.o. 06/05/2023   Principle Diagnosis:  Stage IIIB (T1N3M0) invasive urothelial carcinoma of the bladder  Subsegmental blood clot in right lower lung  Current Therapy:   Status post neoadjuvant chemotherapy with ddMVAC Radical cystectomy on 03/03/2020  Xarelto 20 mg p.o. daily-6 months of therapy to start on 06/30/2021 -  changed to Xarelto 10 mg po q day on 12/26/2021     Interim History:  Christina [name] is back for follow-up.  She is not feeling well at all.  Again, she has no energy.  I am not sure why she has no energy.  She has a lot of back issues.  We have sent her to Orthopedic Surgery.  She is not a candidate for any surgical intervention.  She is post to see a pain specialist and may be having an injection.  We will go ahead and get a CT scan of her body to make sure there is nothing going on with respect to her bladder cancer.  I would not think that this is recurred but again, this is always a possibility.  She is also worried about sleeping way too much.  Again, I am not sure as to why this is.  We will check a TSH on her.  She has had no nausea or vomiting.  I think she is eating okay.  Her urostomy seems to be working fairly well.  She has had no bleeding.  There is been no fever.  She is on Xarelto 10 mg.  This is for history of blood clot in the lung.  I will go ahead and give her some Ultram.  Maybe this can help with her pain.\  I will set her up with another CAT scan to make sure nothing is going on with respect to her bladder cancer.  We will try to get this set up in 3 weeks or so.  Overall, I would say that her performance status is probably ECOG 2.   Medications:  Current Outpatient Medications:    PARoxetine (PAXIL) 20 MG tablet, Take 1 tablet (20 mg total) by mouth daily., Disp: 90 tablet, Rfl: 1   VITAMIN D PO, Take 1,000 Int'l Units/1.59m2 by mouth., Disp: , Rfl:     XARELTO 10 MG TABS tablet, TAKE 1 TABLET BY MOUTH EVERY DAY, Disp: 90 tablet, Rfl: 3   LORazepam (ATIVAN) 0.5 MG tablet, Take 1 tablet (0.5 mg total) by mouth 2 (two) times daily as needed for anxiety. (Patient not taking: Reported on 06/05/2023), Disp: 60 tablet, Rfl: 5   omeprazole (PRILOSEC) 40 MG capsule, 1 cap(s) orally 20 minutes before breakfast for 90 days (Patient not taking: Reported on 06/05/2023), Disp: , Rfl:   Allergies:  Allergies  Allergen Reactions   Nutrasweet Aspartame [Aspartame] Diarrhea   Lisinopril Cough    Past Medical History, Surgical history, Social history, and Family History were reviewed and updated.  Review of Systems: Review of Systems  Constitutional: Negative.   HENT:  Negative.    Eyes: Negative.   Respiratory: Negative.    Cardiovascular: Negative.   Gastrointestinal: Negative.   Endocrine: Negative.   Genitourinary: Negative.    Musculoskeletal: Negative.   Skin: Negative.   Neurological: Negative.   Hematological: Negative.   Psychiatric/Behavioral: Negative.      Physical Exam:  height is 5\' 5"  (1.651 m) and weight is 167 lb 1.9 oz (75.8 kg). Her oral temperature is 98.1 F (36.7  C). Her blood pressure is 121/63 (pended) and her pulse is 73. Her respiration is 20 and oxygen saturation is 98%.   Wt Readings from Last 3 Encounters:  06/05/23 167 lb 1.9 oz (75.8 kg)  04/23/23 166 lb 12.8 oz (75.7 kg)  01/09/23 174 lb (78.9 kg)    Physical Exam Vitals reviewed.  HENT:     Head: Normocephalic and atraumatic.  Eyes:     Pupils: Pupils are equal, round, and reactive to light.  Cardiovascular:     Rate and Rhythm: Normal rate and regular rhythm.     Heart sounds: Normal heart sounds.  Pulmonary:     Effort: Pulmonary effort is normal.     Breath sounds: Normal breath sounds.  Abdominal:     General: Bowel sounds are normal.     Palpations: Abdomen is soft.     Comments: Abdominal exam shows the healed laparotomy scar in the midline.   She has some firmness in the middle of the laparotomy scar.  She has the urostomy bag which is well-positioned.  Urine is clear.  There is no fluid wave in the abdomen.  Bowel sounds are present.  She has no palpable liver or spleen tip.  Musculoskeletal:        General: No tenderness or deformity. Normal range of motion.     Cervical back: Normal range of motion.  Lymphadenopathy:     Cervical: No cervical adenopathy.  Skin:    General: Skin is warm and dry.     Findings: No erythema or rash.  Neurological:     Mental Status: She is alert and oriented to person, place, and time.  Psychiatric:        Behavior: Behavior normal.        Thought Content: Thought content normal.        Judgment: Judgment normal.     Lab Results  Component Value Date   WBC 6.4 06/05/2023   HGB 13.4 06/05/2023   HCT 41.7 06/05/2023   MCV 98.8 06/05/2023   PLT 229 06/05/2023     Chemistry      Component Value Date/Time   NA 140 06/05/2023 1133   NA 142 03/04/2017 1016   K 3.6 06/05/2023 1133   CL 104 06/05/2023 1133   CO2 27 06/05/2023 1133   BUN 17 06/05/2023 1133   BUN 13 03/04/2017 1016   CREATININE 1.06 (H) 06/05/2023 1133   CREATININE 0.78 05/20/2015 1704      Component Value Date/Time   CALCIUM 10.2 06/05/2023 1133   ALKPHOS 74 06/05/2023 1133   AST 15 06/05/2023 1133   ALT 6 06/05/2023 1133   BILITOT 1.1 06/05/2023 1133      Impression and Plan:  Christina [name] is a 82 year old white female.  She had superficial bladder cancer.  She was treated with intravesicular therapy.  However, she has had had muscle invasive cancer which was poorly differentiated.  She then underwent neoadjuvant chemotherapy followed by radical cystectomy.  She was found to have stage IIIB disease.  I would be very surprised if her cancer is recurred.  Again I think her issues are clearly not malignant.  However, we will do our best to try to help her out.  Maybe, the Ultram will help with her back.  Maybe, she  was back to the orthopedist, they can do another injection in the back to see how this works.  I would like to see her back in about 6-8 weeks.  I  would like to see her back before Christmas.  Hopefully, she will be feeling better.   Josph Macho, MD 10/9/202412:55 PM

## 2023-06-07 DIAGNOSIS — C7911 Secondary malignant neoplasm of bladder: Secondary | ICD-10-CM | POA: Diagnosis not present

## 2023-06-07 DIAGNOSIS — Z936 Other artificial openings of urinary tract status: Secondary | ICD-10-CM | POA: Diagnosis not present

## 2023-06-07 DIAGNOSIS — K9409 Other complications of colostomy: Secondary | ICD-10-CM | POA: Diagnosis not present

## 2023-06-28 ENCOUNTER — Telehealth (HOSPITAL_BASED_OUTPATIENT_CLINIC_OR_DEPARTMENT_OTHER): Payer: Self-pay

## 2023-07-07 ENCOUNTER — Ambulatory Visit (HOSPITAL_BASED_OUTPATIENT_CLINIC_OR_DEPARTMENT_OTHER)
Admission: RE | Admit: 2023-07-07 | Discharge: 2023-07-07 | Disposition: A | Discharge status: Home / Self Care | Payer: Medicare PPO | Source: Ambulatory Visit | Attending: Hematology & Oncology | Admitting: Hematology & Oncology

## 2023-07-07 DIAGNOSIS — D3501 Benign neoplasm of right adrenal gland: Secondary | ICD-10-CM | POA: Insufficient documentation

## 2023-07-07 DIAGNOSIS — F418 Other specified anxiety disorders: Secondary | ICD-10-CM | POA: Diagnosis not present

## 2023-07-07 DIAGNOSIS — I700 Atherosclerosis of aorta: Secondary | ICD-10-CM | POA: Insufficient documentation

## 2023-07-07 DIAGNOSIS — J439 Emphysema, unspecified: Secondary | ICD-10-CM | POA: Insufficient documentation

## 2023-07-07 DIAGNOSIS — Z8551 Personal history of malignant neoplasm of bladder: Secondary | ICD-10-CM | POA: Diagnosis not present

## 2023-07-07 DIAGNOSIS — R590 Localized enlarged lymph nodes: Secondary | ICD-10-CM | POA: Insufficient documentation

## 2023-07-07 DIAGNOSIS — Z906 Acquired absence of other parts of urinary tract: Secondary | ICD-10-CM | POA: Diagnosis not present

## 2023-07-07 DIAGNOSIS — C679 Malignant neoplasm of bladder, unspecified: Secondary | ICD-10-CM | POA: Diagnosis not present

## 2023-07-07 MED ORDER — IOHEXOL 300 MG/ML  SOLN
100.0000 mL | Freq: Once | INTRAMUSCULAR | Status: AC | PRN
  Administered 2023-07-07: 100 mL via INTRAVENOUS

## 2023-08-01 ENCOUNTER — Telehealth (HOSPITAL_BASED_OUTPATIENT_CLINIC_OR_DEPARTMENT_OTHER): Payer: Self-pay

## 2023-08-01 ENCOUNTER — Inpatient Hospital Stay: Payer: Medicare PPO | Admitting: Hematology & Oncology

## 2023-08-01 ENCOUNTER — Inpatient Hospital Stay: Payer: Medicare PPO | Attending: Hematology & Oncology

## 2023-08-01 VITALS — BP 126/86 | HR 74 | Temp 97.5°F | Resp 18 | Ht 65.0 in | Wt 167.0 lb

## 2023-08-01 DIAGNOSIS — F418 Other specified anxiety disorders: Secondary | ICD-10-CM

## 2023-08-01 DIAGNOSIS — Z9221 Personal history of antineoplastic chemotherapy: Secondary | ICD-10-CM | POA: Insufficient documentation

## 2023-08-01 DIAGNOSIS — Z7901 Long term (current) use of anticoagulants: Secondary | ICD-10-CM | POA: Diagnosis not present

## 2023-08-01 DIAGNOSIS — Z86718 Personal history of other venous thrombosis and embolism: Secondary | ICD-10-CM | POA: Insufficient documentation

## 2023-08-01 DIAGNOSIS — C679 Malignant neoplasm of bladder, unspecified: Secondary | ICD-10-CM | POA: Insufficient documentation

## 2023-08-01 DIAGNOSIS — Z79899 Other long term (current) drug therapy: Secondary | ICD-10-CM | POA: Diagnosis not present

## 2023-08-01 DIAGNOSIS — Z8551 Personal history of malignant neoplasm of bladder: Secondary | ICD-10-CM | POA: Diagnosis not present

## 2023-08-01 LAB — CMP (CANCER CENTER ONLY)
ALT: 7 U/L (ref 0–44)
AST: 15 U/L (ref 15–41)
Albumin: 4 g/dL (ref 3.5–5.0)
Alkaline Phosphatase: 71 U/L (ref 38–126)
Anion gap: 6 (ref 5–15)
BUN: 18 mg/dL (ref 8–23)
CO2: 29 mmol/L (ref 22–32)
Calcium: 10.6 mg/dL — ABNORMAL HIGH (ref 8.9–10.3)
Chloride: 107 mmol/L (ref 98–111)
Creatinine: 1.12 mg/dL — ABNORMAL HIGH (ref 0.44–1.00)
GFR, Estimated: 49 mL/min — ABNORMAL LOW (ref >60)
Glucose, Bld: 124 mg/dL — ABNORMAL HIGH (ref 70–99)
Potassium: 3.8 mmol/L (ref 3.5–5.1)
Sodium: 142 mmol/L (ref 135–145)
Total Bilirubin: 0.8 mg/dL (ref <1.2)
Total Protein: 6.8 g/dL (ref 6.5–8.1)

## 2023-08-01 LAB — CBC WITH DIFFERENTIAL (CANCER CENTER ONLY)
Abs Immature Granulocytes: 0.01 10*3/uL (ref 0.00–0.07)
Basophils Absolute: 0 10*3/uL (ref 0.0–0.1)
Basophils Relative: 1 %
Eosinophils Absolute: 0.1 10*3/uL (ref 0.0–0.5)
Eosinophils Relative: 2 %
HCT: 40.7 % (ref 36.0–46.0)
Hemoglobin: 13.4 g/dL (ref 12.0–15.0)
Immature Granulocytes: 0 %
Lymphocytes Relative: 38 %
Lymphs Abs: 2.3 10*3/uL (ref 0.7–4.0)
MCH: 32.1 pg (ref 26.0–34.0)
MCHC: 32.9 g/dL (ref 30.0–36.0)
MCV: 97.6 fL (ref 80.0–100.0)
Monocytes Absolute: 0.4 10*3/uL (ref 0.1–1.0)
Monocytes Relative: 7 %
Neutro Abs: 3.1 10*3/uL (ref 1.7–7.7)
Neutrophils Relative %: 52 %
Platelet Count: 222 10*3/uL (ref 150–400)
RBC: 4.17 MIL/uL (ref 3.87–5.11)
RDW: 13.4 % (ref 11.5–15.5)
WBC Count: 6 10*3/uL (ref 4.0–10.5)
nRBC: 0 % (ref 0.0–0.2)

## 2023-08-01 LAB — TSH: TSH: 2.128 u[IU]/mL (ref 0.350–4.500)

## 2023-08-01 LAB — IRON AND IRON BINDING CAPACITY (CC-WL,HP ONLY)
Iron: 116 ug/dL (ref 28–170)
Saturation Ratios: 35 % — ABNORMAL HIGH (ref 10.4–31.8)
TIBC: 335 ug/dL (ref 250–450)
UIBC: 219 ug/dL (ref 148–442)

## 2023-08-01 LAB — FERRITIN: Ferritin: 27 ng/mL (ref 11–307)

## 2023-08-01 NOTE — Progress Notes (Signed)
Hematology and Oncology Follow Up Visit  Christina [name] [id number] [birth year] 82 y.o. 08/01/2023   Principle Diagnosis:  Stage IIIB (T1N3M0) invasive urothelial carcinoma of the bladder  Subsegmental blood clot in right lower lung  Current Therapy:   Status post neoadjuvant chemotherapy with ddMVAC Radical cystectomy on 03/03/2020  Xarelto 20 mg p.o. daily-6 months of therapy to start on 06/30/2021 -  changed to Xarelto 10 mg po q day on 12/26/2021     Interim History:  Christina [name] is back for follow-up.  She has some problems with her back.  She really needs to see her orthopedist.  She has seen one in the past.  Otherwise, she seems to be managing.  She continues on Xarelto at a low dose.  This was for the pulmonary embolism in her right lung.  Thankfully, the follow-up CT angiogram that she had done back in January 2023 did not show any evidence of residual embolus.  She had a CT scan that was done on 07/07/2023.  This did not show any evidence of obvious disease.  However, there was a 6 mm node in the retroperitoneal area.  We will have to follow-up with this when we do another CT scan in 2025.  She has had no abdominal pain..  She has had no cough or shortness of breath.  She did have a nice Thanksgiving with her family.  There is been no leg swelling.  She has had no rashes.  There is been no bleeding.  Overall, I would have to say that her performance status is probably ECOG 1-2.   Medications:  Current Outpatient Medications:    LORazepam (ATIVAN) 0.5 MG tablet, Take 1 tablet (0.5 mg total) by mouth 2 (two) times daily as needed for anxiety., Disp: 60 tablet, Rfl: 5   magnesium (MAGTAB) 84 MG ( ) TBCR SR tablet, Take 84 mg by mouth daily., Disp: , Rfl:    omeprazole (PRILOSEC) 40 MG capsule, , Disp: , Rfl:    PARoxetine (PAXIL) 20 MG tablet, Take 1 tablet (20 mg total) by mouth daily., Disp: 90 tablet, Rfl: 1   traMADol (ULTRAM) 50 MG tablet, Take 1 tablet (50 mg  total) by mouth every 6 (six) hours as needed., Disp: 60 tablet, Rfl: 0   VITAMIN D PO, Take 1,000 Int'l Units/1.14m2 by mouth., Disp: , Rfl:    XARELTO 10 MG TABS tablet, TAKE 1 TABLET BY MOUTH EVERY DAY, Disp: 90 tablet, Rfl: 3  Allergies:  Allergies  Allergen Reactions   Nutrasweet Aspartame [Aspartame] Diarrhea   Lisinopril Cough    Past Medical History, Surgical history, Social history, and Family History were reviewed and updated.  Review of Systems: Review of Systems  Constitutional: Negative.   HENT:  Negative.    Eyes: Negative.   Respiratory: Negative.    Cardiovascular: Negative.   Gastrointestinal: Negative.   Endocrine: Negative.   Genitourinary: Negative.    Musculoskeletal: Negative.   Skin: Negative.   Neurological: Negative.   Hematological: Negative.   Psychiatric/Behavioral: Negative.      Physical Exam:  height is 5\' 5"  (1.651 m) and weight is 167 lb (75.8 kg). Her oral temperature is 97.5 F (36.4 C) (abnormal). Her blood pressure is 126/86 and her pulse is 74. Her respiration is 18 and oxygen saturation is 99%.   Wt Readings from Last 3 Encounters:  08/01/23 167 lb (75.8 kg)  06/05/23 167 lb 1.9 oz (75.8 kg)  04/23/23 166 lb 12.8 oz (75.7 kg)  Physical Exam Vitals reviewed.  HENT:     Head: Normocephalic and atraumatic.  Eyes:     Pupils: Pupils are equal, round, and reactive to light.  Cardiovascular:     Rate and Rhythm: Normal rate and regular rhythm.     Heart sounds: Normal heart sounds.  Pulmonary:     Effort: Pulmonary effort is normal.     Breath sounds: Normal breath sounds.  Abdominal:     General: Bowel sounds are normal.     Palpations: Abdomen is soft.     Comments: Abdominal exam shows the healed laparotomy scar in the midline.  She has some firmness in the middle of the laparotomy scar.  She has the urostomy bag which is well-positioned.  Urine is clear.  There is no fluid wave in the abdomen.  Bowel sounds are present.  She  has no palpable liver or spleen tip.  Musculoskeletal:        General: No tenderness or deformity. Normal range of motion.     Cervical back: Normal range of motion.  Lymphadenopathy:     Cervical: No cervical adenopathy.  Skin:    General: Skin is warm and dry.     Findings: No erythema or rash.  Neurological:     Mental Status: She is alert and oriented to person, place, and time.  Psychiatric:        Behavior: Behavior normal.        Thought Content: Thought content normal.        Judgment: Judgment normal.    Lab Results  Component Value Date   WBC 6.0 08/01/2023   HGB 13.4 08/01/2023   HCT 40.7 08/01/2023   MCV 97.6 08/01/2023   PLT 222 08/01/2023     Chemistry      Component Value Date/Time   NA 142 08/01/2023 1146   NA 142 03/04/2017 1016   K 3.8 08/01/2023 1146   CL 107 08/01/2023 1146   CO2 29 08/01/2023 1146   BUN 18 08/01/2023 1146   BUN 13 03/04/2017 1016   CREATININE 1.12 (H) 08/01/2023 1146   CREATININE 0.78 05/20/2015 1704      Component Value Date/Time   CALCIUM 10.6 (H) 08/01/2023 1146   ALKPHOS 71 08/01/2023 1146   AST 15 08/01/2023 1146   ALT 7 08/01/2023 1146   BILITOT 0.8 08/01/2023 1146      Impression and Plan:  Christina [name] is an 82 year old white female.  She had superficial bladder cancer.  She was treated with intravesicular therapy.  However, she has had had muscle invasive cancer which was poorly differentiated.  She then underwent neoadjuvant chemotherapy followed by radical cystectomy.  She was found to have stage IIIB disease.  I will see her back in the late Winter or early Spring.  At that time, we will do another CT scan on her.  Hopefully, her back can be adjusted.  We did going give her a handicap form so that she can get a placard for her car.  I just wanted to have a good quality of life.  Hopefully she will have a very nice Christmas.   Josph Macho, MD 12/5/202412:57 PM

## 2023-09-06 DIAGNOSIS — C7911 Secondary malignant neoplasm of bladder: Secondary | ICD-10-CM | POA: Diagnosis not present

## 2023-09-06 DIAGNOSIS — K9409 Other complications of colostomy: Secondary | ICD-10-CM | POA: Diagnosis not present

## 2023-09-06 DIAGNOSIS — Z936 Other artificial openings of urinary tract status: Secondary | ICD-10-CM | POA: Diagnosis not present

## 2023-09-24 ENCOUNTER — Ambulatory Visit: Payer: Medicare PPO | Admitting: Family Medicine

## 2023-09-24 ENCOUNTER — Encounter: Payer: Self-pay | Admitting: Family Medicine

## 2023-09-24 VITALS — BP 116/70 | HR 75 | Temp 97.9°F | Wt 161.0 lb

## 2023-09-24 DIAGNOSIS — J209 Acute bronchitis, unspecified: Secondary | ICD-10-CM

## 2023-09-24 MED ORDER — HYDROCODONE BIT-HOMATROP MBR 5-1.5 MG/5ML PO SOLN: ORAL | 0 refills | Status: DC

## 2023-09-24 MED ORDER — PREDNISONE 20 MG PO TABS: ORAL_TABLET | ORAL | 0 refills | Status: DC

## 2023-09-24 NOTE — Progress Notes (Signed)
OFFICE VISIT  09/24/2023  CC:  Chief Complaint  Patient presents with   Cough    Over a week; cough, fatigue, HA, congestion. Denies fever. OTC cough medicine. Has found no relief.     Patient is a 83 y.o. female who presents for cough.  HPI: 1 week history of cough.  Sometimes hears a wheeze coming from her upper chest area.  The cough is not all that productive.  It is worse at night, gives her fits and wakes her up. Denies shortness of breath or fever.  She does feel tired and a little bit achy. No significant nasal congestion or rhinorrhea. She does have some headache. Robitussin DM and Mucinex DM over-the-counter have not helped.  Past Medical History:  Diagnosis Date   Anal fissure 10/24/2021   Arthritis    Asthma    a little?, no problems in several years   Bladder cancer (HCC)    Bladder tumor    Cervical cancer screening 01/31/2012   Chicken pox as a child   Chronic idiopathic constipation 10/24/2021   Degenerative tear of acetabular labrum of left hip 02/26/2019   Dehydration 01/19/2020   Depression with anxiety 06/07/2009   Qualifier: Diagnosis of  By: Linford Arnold MD, Catherine     Dermatitis of external ear 07/11/2012   Diverticulosis    Essential hypertension, benign 10/13/2007   Qualifier: Diagnosis of  By: Linford Arnold MD, Catherine     Ganglion cyst 02/26/2019   Right elbow   Hiatal hernia with gastroesophageal reflux 10/26/2010   Qualifier: Diagnosis of  By: Linford Arnold MD, Catherine     Hyperglycemia 06/21/2013   Hyperlipidemia, mixed 10/16/2015   pt unaware   Hypertension    Left hip pain 07/10/2015   Lesion of breast 12/03/2016   benign; resolved   Low back pain 05/29/2015   Measles as a child   Mumps as a child   Osteopenia 01/03/2017   Overweight (BMI 25.0-29.9) 10/07/2008   Qualifier: Diagnosis of  By: Linford Arnold MD, Santina Evans     Palpitations    several years ago, not currently   Pre-diabetes    Psoriasis    ears   Rectal bleeding 10/24/2021    RUQ pain 05/29/2015   Spider veins    Bilateral legs   Spinal stenosis    Tailor's bunionette, left 09/02/2017   Umbilical hernia    Urinary frequency 09/28/2011   Vertigo    Wears dentures    Upper,    Past Surgical History:  Procedure Laterality Date   ABDOMINAL SURGERY  1970's   BREAST BIOPSY Right 1980   BREAST EXCISIONAL BIOPSY   BREAST BIOPSY Left 1970   BREAST EXCISIONAL BIOPSY   BREAST CYST ASPIRATION     CATARACT EXTRACTION, BILATERAL     COLONOSCOPY     CYSTECTOMY     abdomen   CYSTOSCOPY W/ RETROGRADES Bilateral 04/27/2019   Procedure: CYSTOSCOPY WITH RETROGRADE PYELOGRAM;  Surgeon: Malen Gauze, MD;  Location: [facility];  Service: Urology;  Laterality: Bilateral;   EYE SURGERY Bilateral    cataract   INSERTION OF MESH N/A 06/11/2017   Procedure: INSERTION OF MESH;  Surgeon: Emelia Loron, MD;  Location: [facility] OR;  Service: General;  Laterality: N/A;  BILATERAL TAP BLOCK   IR REMOVAL TUN ACCESS W/ PORT W/O FL MOD SED  11/09/2020   TRANSURETHRAL RESECTION OF BLADDER TUMOR N/A 04/27/2019   Procedure: TRANSURETHRAL RESECTION OF BLADDER TUMOR (TURBT);  Surgeon: Malen Gauze, MD;  Location:  [facility];  Service: Urology;  Laterality: N/A;  1 HR   TRANSURETHRAL RESECTION OF BLADDER TUMOR N/A 06/01/2019   Procedure: TRANSURETHRAL RESECTION OF BLADDER TUMOR (TURBT);  Surgeon: Malen Gauze, MD;  Location: [facility];  Service: Urology;  Laterality: N/A;  1 HR   TRANSURETHRAL RESECTION OF BLADDER TUMOR N/A 10/22/2019   Procedure: TRANSURETHRAL RESECTION OF BLADDER TUMOR (TURBT);  Surgeon: Malen Gauze, MD;  Location: [facility];  Service: Urology;  Laterality: N/A;   TUBAL LIGATION  1970   VENTRAL HERNIA REPAIR N/A 06/11/2017   Procedure: LAPAROSCOPIC VENTRAL HERNIA REPAIR WITH MESH ERAS PATHWAY;  Surgeon: Emelia Loron, MD;  Location: [facility] OR;  Service: General;  Laterality: N/A;   BILATERAL TAP BLOCK    Outpatient Medications Prior to Visit  Medication Sig Dispense Refill   LORazepam (ATIVAN) 0.5 MG tablet Take 1 tablet (0.5 mg total) by mouth 2 (two) times daily as needed for anxiety. 60 tablet 5   magnesium (MAGTAB) 84 MG ( ) TBCR SR tablet Take 84 mg by mouth daily.     PARoxetine (PAXIL) 20 MG tablet Take 1 tablet (20 mg total) by mouth daily. 90 tablet 1   VITAMIN D PO Take 1,000 Int'l Units/1.47m2 by mouth.     XARELTO 10 MG TABS tablet TAKE 1 TABLET BY MOUTH EVERY DAY 90 tablet 3   omeprazole (PRILOSEC) 40 MG capsule      traMADol (ULTRAM) 50 MG tablet Take 1 tablet (50 mg total) by mouth every 6 (six) hours as needed. 60 tablet 0   No facility-administered medications prior to visit.    Allergies  Allergen Reactions   Nutrasweet Aspartame [Aspartame] Diarrhea   Lisinopril Cough    Review of Systems  As per HPI  PE:    09/24/2023   11:09 AM 08/01/2023   12:35 PM 06/05/2023   12:36 PM  Vitals with BMI  Height  5\' 5"    Weight 161 lbs 167 lbs   BMI  27.79   Systolic 116 126 366  Diastolic 70 86 63  Pulse 75 74      Physical Exam  VS: noted--normal. Gen: alert, NAD, NONTOXIC APPEARING. HEENT: eyes without injection, drainage, or swelling.  Ears: EACs clear, TMs with normal light reflex and landmarks.  Nose: Clear rhinorrhea, with some dried, crusty exudate adherent to mildly injected mucosa.  No purulent d/c.  No paranasal sinus TTP.  No facial swelling.  Throat and mouth without focal lesion.  No pharyngial swelling, erythema, or exudate.   Neck: supple, no LAD.   LUNGS: CTA bilat, nonlabored resps.   CV: RRR, no m/r/g. EXT: no c/c/e SKIN: no rash   LABS:  Last CBC Lab Results  Component Value Date   WBC 6.0 08/01/2023   HGB 13.4 08/01/2023   HCT 40.7 08/01/2023   MCV 97.6 08/01/2023   MCH 32.1 08/01/2023   RDW 13.4 08/01/2023   PLT 222 08/01/2023   Last metabolic panel Lab Results  Component Value Date   GLUCOSE 124 (H)  08/01/2023   NA 142 08/01/2023   K 3.8 08/01/2023   CL 107 08/01/2023   CO2 29 08/01/2023   BUN 18 08/01/2023   CREATININE 1.12 (H) 08/01/2023   GFRNONAA 49 (L) 08/01/2023   CALCIUM 10.6 (H) 08/01/2023   PHOS 3.2 05/31/2014   PROT 6.8 08/01/2023   ALBUMIN 4.0 08/01/2023   LABGLOB 2.2 03/04/2017   AGRATIO 2.0 03/04/2017   BILITOT 0.8 08/01/2023  ALKPHOS 71 08/01/2023   AST 15 08/01/2023   ALT 7 08/01/2023   ANIONGAP 6 08/01/2023   IMPRESSION AND PLAN:  Acute bronchitis. Prednisone 40 mg a day x 5 days. Hycodan syrup, 1 teaspoon nightly as needed, 60 mL. Therapeutic expectations and side effect profile of medication discussed today.  Patient's questions answered.  Continue Mucinex DM or Robitussin DM in the daytime.  An After Visit Summary was printed and given to the patient.  FOLLOW UP: Return if symptoms worsen or fail to improve.  Signed:  Santiago Bumpers, MD           09/24/2023

## 2023-10-25 ENCOUNTER — Telehealth: Payer: Self-pay | Admitting: Family Medicine

## 2023-10-25 NOTE — Telephone Encounter (Signed)
 Spoke with pt

## 2023-10-25 NOTE — Telephone Encounter (Signed)
 Patient is calling in regarding her  appt  she needs to verify her aopt time because she is getting different notifications for times.

## 2023-10-25 NOTE — Telephone Encounter (Signed)
 LVM to discuss

## 2023-10-30 ENCOUNTER — Encounter (HOSPITAL_BASED_OUTPATIENT_CLINIC_OR_DEPARTMENT_OTHER): Payer: Self-pay

## 2023-10-30 ENCOUNTER — Ambulatory Visit (HOSPITAL_BASED_OUTPATIENT_CLINIC_OR_DEPARTMENT_OTHER)
Admission: RE | Admit: 2023-10-30 | Discharge: 2023-10-30 | Disposition: A | Discharge status: Home / Self Care | Payer: Medicare PPO | Source: Ambulatory Visit | Attending: Hematology & Oncology | Admitting: Hematology & Oncology

## 2023-10-30 DIAGNOSIS — Z8551 Personal history of malignant neoplasm of bladder: Secondary | ICD-10-CM

## 2023-10-30 DIAGNOSIS — R918 Other nonspecific abnormal finding of lung field: Secondary | ICD-10-CM | POA: Diagnosis not present

## 2023-10-30 DIAGNOSIS — I700 Atherosclerosis of aorta: Secondary | ICD-10-CM | POA: Insufficient documentation

## 2023-10-30 DIAGNOSIS — C679 Malignant neoplasm of bladder, unspecified: Secondary | ICD-10-CM | POA: Diagnosis not present

## 2023-10-30 MED ORDER — IOHEXOL 300 MG/ML  SOLN
100.0000 mL | Freq: Once | INTRAMUSCULAR | Status: AC | PRN
  Administered 2023-10-30: 100 mL via INTRAVENOUS

## 2023-11-01 ENCOUNTER — Ambulatory Visit: Payer: Medicare PPO | Admitting: Family Medicine

## 2023-11-01 ENCOUNTER — Encounter: Payer: Self-pay | Admitting: Family Medicine

## 2023-11-01 VITALS — BP 126/76 | HR 75 | Temp 98.0°F | Ht 64.5 in | Wt 163.6 lb

## 2023-11-01 DIAGNOSIS — M810 Age-related osteoporosis without current pathological fracture: Secondary | ICD-10-CM

## 2023-11-01 DIAGNOSIS — Z1231 Encounter for screening mammogram for malignant neoplasm of breast: Secondary | ICD-10-CM

## 2023-11-01 DIAGNOSIS — Z13220 Encounter for screening for lipoid disorders: Secondary | ICD-10-CM

## 2023-11-01 DIAGNOSIS — E559 Vitamin D deficiency, unspecified: Secondary | ICD-10-CM

## 2023-11-01 DIAGNOSIS — Z8551 Personal history of malignant neoplasm of bladder: Secondary | ICD-10-CM | POA: Diagnosis not present

## 2023-11-01 DIAGNOSIS — Z0000 Encounter for general adult medical examination without abnormal findings: Secondary | ICD-10-CM

## 2023-11-01 DIAGNOSIS — K5901 Slow transit constipation: Secondary | ICD-10-CM | POA: Insufficient documentation

## 2023-11-01 DIAGNOSIS — F418 Other specified anxiety disorders: Secondary | ICD-10-CM

## 2023-11-01 DIAGNOSIS — R7309 Other abnormal glucose: Secondary | ICD-10-CM | POA: Diagnosis not present

## 2023-11-01 HISTORY — DX: Slow transit constipation: K59.01

## 2023-11-01 LAB — COMPREHENSIVE METABOLIC PANEL
ALT: 8 U/L (ref 0–35)
AST: 16 U/L (ref 0–37)
Albumin: 3.8 g/dL (ref 3.5–5.2)
Alkaline Phosphatase: 70 U/L (ref 39–117)
BUN: 15 mg/dL (ref 6–23)
CO2: 29 meq/L (ref 19–32)
Calcium: 9.9 mg/dL (ref 8.4–10.5)
Chloride: 104 meq/L (ref 96–112)
Creatinine, Ser: 1.04 mg/dL (ref 0.40–1.20)
GFR: 50.28 mL/min — ABNORMAL LOW (ref >60.00)
Glucose, Bld: 139 mg/dL — ABNORMAL HIGH (ref 70–99)
Potassium: 3.8 meq/L (ref 3.5–5.1)
Sodium: 141 meq/L (ref 135–145)
Total Bilirubin: 1 mg/dL (ref 0.2–1.2)
Total Protein: 6.1 g/dL (ref 6.0–8.3)

## 2023-11-01 LAB — CBC
HCT: 41.1 % (ref 36.0–46.0)
Hemoglobin: 13.4 g/dL (ref 12.0–15.0)
MCHC: 32.5 g/dL (ref 30.0–36.0)
MCV: 98.8 fl (ref 78.0–100.0)
Platelets: 253 10*3/uL (ref 150.0–400.0)
RBC: 4.16 Mil/uL (ref 3.87–5.11)
RDW: 14.4 % (ref 11.5–15.5)
WBC: 4.7 10*3/uL (ref 4.0–10.5)

## 2023-11-01 LAB — LIPID PANEL
Cholesterol: 191 mg/dL (ref 0–200)
HDL: 67.4 mg/dL (ref >39.00)
LDL Cholesterol: 111 mg/dL — ABNORMAL HIGH (ref 0–99)
NonHDL: 124.06
Total CHOL/HDL Ratio: 3
Triglycerides: 66 mg/dL (ref 0.0–149.0)
VLDL: 13.2 mg/dL (ref 0.0–40.0)

## 2023-11-01 LAB — VITAMIN D 25 HYDROXY (VIT D DEFICIENCY, FRACTURES): VITD: 43.95 ng/mL (ref 30.00–100.00)

## 2023-11-01 LAB — HEMOGLOBIN A1C: Hgb A1c MFr Bld: 6 % (ref 4.6–6.5)

## 2023-11-01 MED ORDER — PAROXETINE HCL 20 MG PO TABS: 20.0000 mg | ORAL_TABLET | Freq: Every day | ORAL | 1 refills | Status: DC

## 2023-11-01 NOTE — Patient Instructions (Addendum)

## 2023-11-01 NOTE — Progress Notes (Signed)
 Patient ID: Christina [name], female  DOB: 1940-11-10, 83 y.o.   MRN: [id number] Patient Care Team    Relationship Specialty Notifications Start End  Natalia Leatherwood, DO PCP - General Family Medicine  09/02/17   Emelia Loron, MD Consulting Physician General Surgery  10/05/16   Mateo Flow, MD Consulting Physician Ophthalmology  10/05/16   Carola Frost, DDS Consulting Physician Dentistry  10/05/16   Charna Elizabeth, MD Consulting Physician Gastroenterology  09/02/17   Josph Macho, MD Medical Oncologist Oncology  05/14/19   Sheran Luz, MD Consulting Physician Physical Medicine and Rehabilitation  01/12/21   Judi Saa, DO Consulting Physician Sports Medicine  01/12/21   Noel Christmas, MD Consulting Physician Urology  10/30/22     Chief Complaint  Patient presents with   Annual Exam    Chronic Conditions/illness Management Pt is not fasting.     Subjective: Christina JUSTINIANO is a 83 y.o. female present for CPE and Chronic Conditions/illness Management  All past medical history, surgical history, allergies, family history, immunizations, medications and social history were updated in the electronic medical record today. All recent labs, ED visits and hospitalizations within the last year were reviewed.  Health maintenance:  Mammogram: Completed 12/12/2022 within normal limits.[facility]> ordered 2025 Immunizations: Tetanus UTD 09/2021, declined flu shot.  pneumonia series completed.  declined shingrix.   DEXA: 11/2021  (-2.7)>ordered Assistive device: none Oxygen WUJ:WJXB Patient has a Dental home. Hospitalizations/ED visits: Reviewed  Situational anxiety:  Patient report she is feeling well and compliant  with ativan 0.5 mg BID prn and paxil 20 mg qd. She rarely uses the ativan- only if she can not sleep. Has filled one 30d script in 6 mos and has some left in bottle.  Prior note: Was started on Paxil by her oncology team at the onset of diagnosis of bladder cancer.  She does feel that it has been helpful along with the Ativan 0.5 mg twice daily. She reports she had a provider tell her that Zoloft is better for her than Paxil and she wanted to try zoloft. Switch was made last appt and she feels paxil was a better fit for her. She does have trouble sleeping some nights. She reports ativan  Mg qhs is usually helpful but sometimes she requires additional coverage.    Lumbar degeneration/low back pain: Patient reports she is having trouble with her low back and it hurts every day.  She reports the back pain never improved now.  She is established with Dr. Horald Chestnut, last seen 05/2022 .       11/01/2023   11:26 AM 04/23/2023   11:03 AM 01/09/2023    1:24 PM 08/17/2022    9:09 AM 04/10/2022   11:13 AM  Depression screen PHQ 2/9  Decreased Interest 1 2 0 0 1  Down, Depressed, Hopeless 1 1 0 0 1  PHQ - 2 Score 2 3 0 0 2  Altered sleeping 3 3  0 3  Tired, decreased energy 3 3  0 3  Change in appetite 0 0  0 0  Feeling bad or failure about yourself  0 1  0 0  Trouble concentrating 1 3  0 1  Moving slowly or fidgety/restless 1 2  0 1  Suicidal thoughts 0 0  0 0  PHQ-9 Score 10 15  0 10  Difficult doing work/chores Somewhat difficult Somewhat difficult  Not difficult at all       11/01/2023   11:26  AM 04/23/2023   11:03 AM 04/10/2022   11:14 AM 10/24/2021   10:54 AM  GAD 7 : Generalized Anxiety Score  Nervous, Anxious, on Edge 0 0 1 0  Control/stop worrying 1 1 1  0  Worry too much - different things 1 1 1 1   Trouble relaxing 0 0 1 0  Restless 1 0 3 1  Easily annoyed or irritable 1 1 1 1   Afraid - awful might happen 1 0 2 1  Total GAD 7 Score 5 3 10 4   Anxiety Difficulty Not difficult at all Not difficult at all            11/01/2023   11:26 AM 04/23/2023   11:03 AM 08/17/2022    9:10 AM 01/08/2022    3:06 PM 01/03/2022    1:41 PM  Fall Risk   Falls in the past year? 0 0 0 1 1  Number falls in past yr:  0  0 1  Injury with Fall?  0  0 0  Risk for fall  due to :  No Fall Risks No Fall Risks    Follow up Falls evaluation completed Falls evaluation completed Falls evaluation completed  Falls prevention discussed   Immunization History  Administered Date(s) Administered   Influenza Whole 06/07/2009, 05/28/2011, 05/28/2012   Influenza, High Dose Seasonal PF 05/20/2015   Influenza,inj,Quad PF,6+ Mos 05/26/2013, 05/31/2014   Moderna Sars-Covid-2 Vaccination 12/11/2019, 02/12/2020, 09/27/2020   Pneumococcal Conjugate-13 05/31/2014   Pneumococcal Polysaccharide-23 06/07/2009   Td 01/31/2003   Td (Adult), 2 Lf Tetanus Toxid, Preservative Free 01/31/2003   Tdap 09/28/2011, 10/24/2021   Past Medical History:  Diagnosis Date   Anal fissure 10/24/2021   Arthritis    Asthma    a little?, no problems in several years   Bladder cancer (HCC)    Bladder tumor    Cervical cancer screening 01/31/2012   Chicken pox as a child   Chronic idiopathic constipation 10/24/2021   Degenerative tear of acetabular labrum of left hip 02/26/2019   Dehydration 01/19/2020   Depression with anxiety 06/07/2009   Qualifier: Diagnosis of  By: Linford Arnold MD, Catherine     Dermatitis of external ear 07/11/2012   Diverticulosis    Essential hypertension, benign 10/13/2007   Qualifier: Diagnosis of  By: Linford Arnold MD, Catherine     Ganglion cyst 02/26/2019   Right elbow   Hiatal hernia with gastroesophageal reflux 10/26/2010   Qualifier: Diagnosis of  By: Linford Arnold MD, Catherine     Hyperglycemia 06/21/2013   Hyperlipidemia, mixed 10/16/2015   pt unaware   Hypertension    Left hip pain 07/10/2015   Lesion of breast 12/03/2016   benign; resolved   Low back pain 05/29/2015   Measles as a child   Mumps as a child   Osteopenia 01/03/2017   Overweight (BMI 25.0-29.9) 10/07/2008   Qualifier: Diagnosis of  By: Linford Arnold MD, Santina Evans     Palpitations    several years ago, not currently   Pre-diabetes    Psoriasis    ears   Rectal bleeding 10/24/2021   RUQ pain  05/29/2015   Spider veins    Bilateral legs   Spinal stenosis    Tailor's bunionette, left 09/02/2017   Umbilical hernia    Urinary frequency 09/28/2011   Vertigo    Wears dentures    Upper,   Allergies  Allergen Reactions   Nutrasweet Aspartame [Aspartame] Diarrhea   Lisinopril Cough   Past Surgical History:  Procedure Laterality  Date   ABDOMINAL SURGERY  1970's   BREAST BIOPSY Right 1980   BREAST EXCISIONAL BIOPSY   BREAST BIOPSY Left 1970   BREAST EXCISIONAL BIOPSY   BREAST CYST ASPIRATION     CATARACT EXTRACTION, BILATERAL     COLONOSCOPY     CYSTECTOMY     abdomen   CYSTOSCOPY W/ RETROGRADES Bilateral 04/27/2019   Procedure: CYSTOSCOPY WITH RETROGRADE PYELOGRAM;  Surgeon: Malen Gauze, MD;  [facility];  Service: Urology;  Laterality: Bilateral;   EYE SURGERY Bilateral    cataract   INSERTION OF MESH N/A 06/11/2017   Procedure: INSERTION OF MESH;  Surgeon: Emelia Loron, MD;  Location: [facility];  Service: General;  Laterality: N/A;  BILATERAL TAP BLOCK   IR REMOVAL TUN ACCESS W/ PORT W/O FL MOD SED  11/09/2020   TRANSURETHRAL RESECTION OF BLADDER TUMOR N/A 04/27/2019   Procedure: TRANSURETHRAL RESECTION OF BLADDER TUMOR (TURBT);  Surgeon: Malen Gauze, MD;  Location: [facility];  Service: Urology;  Laterality: N/A;  1 HR   TRANSURETHRAL RESECTION OF BLADDER TUMOR N/A 06/01/2019   Procedure: TRANSURETHRAL RESECTION OF BLADDER TUMOR (TURBT);  Surgeon: Malen Gauze, MD;  Location: [facility];  Service: Urology;  Laterality: N/A;  1 HR   TRANSURETHRAL RESECTION OF BLADDER TUMOR N/A 10/22/2019   Procedure: TRANSURETHRAL RESECTION OF BLADDER TUMOR (TURBT);  Surgeon: Malen Gauze, MD;  Location: [facility];  Service: Urology;  Laterality: N/A;   TUBAL LIGATION  1970   VENTRAL HERNIA REPAIR N/A 06/11/2017   Procedure: LAPAROSCOPIC VENTRAL HERNIA REPAIR WITH MESH ERAS PATHWAY;   Surgeon: Emelia Loron, MD;  Location: [facility] OR;  Service: General;  Laterality: N/A;  BILATERAL TAP BLOCK   Family History  Problem Relation Age of Onset   Hypertension Mother    Anxiety disorder Mother    Ovarian cancer Mother 14       lung- smoker, ovarian   Lung cancer Mother        smoker   Arthritis Mother    Hyperlipidemia Mother    Alcohol abuse Father    Diabetes Sister    Hypertension Sister    Ovarian cancer Sister    Arthritis Sister    Depression Sister    Hyperlipidemia Sister    Asthma Sister    Depression Sister    Hyperlipidemia Sister    Breast cancer Sister    Asthma Sister    Depression Sister    Arthritis Brother    COPD Brother    Heart attack Brother    Hyperlipidemia Brother    Stroke Brother    Prostate cancer Brother    Other Maternal Grandmother        pacemaker   Multiple sclerosis Maternal Grandfather        ?   Asthma Paternal Uncle    Social History   Social History Narrative   Retired. Lives alone.    Attended some business college.    Former smoker.    Smoke alarm in the home. Wears seat balt.    Wears dentures.    Feels safe in her relationships.        Allergies as of 11/01/2023       Reactions   Nutrasweet Aspartame [aspartame] Diarrhea   Lisinopril Cough        Medication List        Accurate as of November 01, 2023 11:27 AM. If you have any questions,  ask your nurse or doctor.          STOP taking these medications    HYDROcodone bit-homatropine 5-1.5 MG/5ML syrup Commonly known as: HYCODAN Stopped by: Felix Pacini   predniSONE 20 MG tablet Commonly known as: DELTASONE Stopped by: Felix Pacini       TAKE these medications    LORazepam 0.5 MG tablet Commonly known as: ATIVAN Take 1 tablet (0.5 mg total) by mouth 2 (two) times daily as needed for anxiety.   magnesium 84 MG ( ) Tbcr SR tablet Commonly known as: MAGTAB Take 84 mg by mouth daily.   PARoxetine 20 MG tablet Commonly known as:  PAXIL Take 1 tablet (20 mg total) by mouth daily.   VITAMIN D PO Take 1,000 Int'l Units/1.43m2 by mouth.   Xarelto 10 MG Tabs tablet Generic drug: rivaroxaban TAKE 1 TABLET BY MOUTH EVERY DAY       All past medical history, surgical history, allergies, family history, immunizations andmedications were updated in the EMR today and reviewed under the history and medication portions of their EMR.       ROS 14 pt review of systems performed and negative (unless mentioned in an HPI)  Objective: BP 126/76   Pulse 75   Temp 98 F (36.7 C)   Ht 5' 4.5" (1.638 m)   Wt 163 lb 9.6 oz (74.2 kg)   SpO2 99%   BMI 27.65 kg/m  Physical Exam Vitals and nursing note reviewed.  Constitutional:      General: She is not in acute distress.    Appearance: Normal appearance. She is not ill-appearing or toxic-appearing.     Comments: Pleasant female  HENT:     Head: Normocephalic and atraumatic.     Right Ear: Tympanic membrane, ear canal and external ear normal. There is no impacted cerumen.     Left Ear: Tympanic membrane, ear canal and external ear normal. There is no impacted cerumen.     Nose: No congestion or rhinorrhea.     Mouth/Throat:     Mouth: Mucous membranes are moist.     Pharynx: Oropharynx is clear. No oropharyngeal exudate or posterior oropharyngeal erythema.  Eyes:     General: No scleral icterus.       Right eye: No discharge.        Left eye: No discharge.     Extraocular Movements: Extraocular movements intact.     Conjunctiva/sclera: Conjunctivae normal.     Pupils: Pupils are equal, round, and reactive to light.  Cardiovascular:     Rate and Rhythm: Normal rate and regular rhythm.     Pulses: Normal pulses.     Heart sounds: Normal heart sounds. No murmur heard.    No friction rub. No gallop.  Pulmonary:     Effort: Pulmonary effort is normal. No respiratory distress.     Breath sounds: Normal breath sounds. No stridor. No wheezing, rhonchi or rales.  Chest:      Chest wall: No tenderness.  Abdominal:     General: Abdomen is flat. Bowel sounds are normal. There is no distension.     Palpations: Abdomen is soft. There is no mass.     Tenderness: There is no abdominal tenderness. There is no right CVA tenderness, left CVA tenderness, guarding or rebound.     Hernia: No hernia is present.  Musculoskeletal:        General: No swelling, tenderness or deformity. Normal range of motion.     Cervical back:  Normal range of motion and neck supple. No rigidity or tenderness.     Right lower leg: No edema.     Left lower leg: No edema.  Lymphadenopathy:     Cervical: No cervical adenopathy.  Skin:    General: Skin is warm and dry.     Coloration: Skin is not jaundiced or pale.     Findings: No bruising, erythema, lesion or rash.  Neurological:     General: No focal deficit present.     Mental Status: She is alert and oriented to person, place, and time. Mental status is at baseline.     Cranial Nerves: No cranial nerve deficit.     Sensory: No sensory deficit.     Motor: No weakness.     Coordination: Coordination normal.     Gait: Gait normal.     Deep Tendon Reflexes: Reflexes normal.  Psychiatric:        Mood and Affect: Mood normal.        Behavior: Behavior normal.        Thought Content: Thought content normal.        Judgment: Judgment normal.     No results found.  Assessment/plan: BRIANTE LOVEALL is a 83 y.o. female present for CPE and Chronic Conditions/illness Management Osteoporosis/vit d def - VITAMIN D levels collected today - DG Bone Density 11/30/2023 due-ordered -T-score -2.7 2023 Elevated hemoglobin A1c/BMI 28.0-28.9,adult -A1c collected today A1c collected today Situational anxiety Stable Continue paxil 20 mg qd/  Continue Ativan qhs prn- NCCs database reviewed.  History of bladder cancer Stable Following with onc and uro  Stage 3a chronic kidney disease (HCC) CMP collected today  Encounter for Preventive  Health care exam: Patient was encouraged to exercise greater than 150 minutes a week. Patient was encouraged to choose a diet filled with fresh fruits and vegetables, and lean meats. AVS provided to patient today for education/recommendation on gender specific health and safety maintenance. Mammogram: Completed 12/12/2022 within normal limits.[facility]> ordered 2025 Immunizations: Tetanus UTD 09/2021, declined flu shot.  pneumonia series completed.  declined shingrix.   DEXA: 11/2021  (-2.7)>ordered  Return in about 24 weeks (around 04/17/2024) for Routine chronic condition follow-up.  Orders Placed This Encounter  Procedures   DG Bone Density   MM 3D SCREENING MAMMOGRAM BILATERAL BREAST   CBC   Comprehensive metabolic panel   Hemoglobin A1c   Lipid panel   Vitamin D (25 hydroxy)    Meds ordered this encounter  Medications   PARoxetine (PAXIL) 20 MG tablet    Sig: Take 1 tablet (20 mg total) by mouth daily.    Dispense:  90 tablet    Refill:  1   Referral Orders  No referral(s) requested today     Note is dictated utilizing voice recognition software. Although note has been proof read prior to signing, occasional typographical errors still can be missed. If any questions arise, please do not hesitate to call for verification.  Electronically signed by: Felix Pacini, DO [facility] [place]

## 2023-11-04 ENCOUNTER — Telehealth: Payer: Self-pay

## 2023-11-04 ENCOUNTER — Encounter: Payer: Self-pay | Admitting: Family Medicine

## 2023-11-04 ENCOUNTER — Other Ambulatory Visit: Payer: Self-pay | Admitting: Hematology & Oncology

## 2023-11-04 DIAGNOSIS — Z8551 Personal history of malignant neoplasm of bladder: Secondary | ICD-10-CM

## 2023-11-04 NOTE — Telephone Encounter (Signed)
-----   Message from Josph Macho sent at 11/04/2023  2:29 PM EDT ----- I left a message on her answering machine.  I told her that there is something on the CT scan though the radiologist was not sure of.  They recommended a MRI of the pelvis.  We will have to set her up for this.  Hopefully, this is nothing but some loops of intestine that could be somewhat collapsed or congested.  I will set the MRI up.  Cindee Lame

## 2023-11-04 NOTE — Telephone Encounter (Signed)
 Received phone call from patient stating she tried to listen to Dr. Gustavo Lah message but her machine cut off the message.  This RN told patient the following: "There is something on the CT scan that the radiologist was not sure of. They recommended a MRI of the pelvis. We will have to set her up for this. Hopefully, this is nothing but some loops of intestine that could be somewhat collapsed or congested. I will set the MRI up. "  Pt educated that the MRI has been ordered and can take up to 10 business days to be authorized. Once the MRI is authorized the scan will be scheduled. Pt aware that if she hasn't heard anything by middle of next week to give the office a call. Pt stated she is not having any symptoms or pain. Pt aware if she has any changes to let the office know or be seen by ER. Pt appreciative of call and had no further questions.

## 2023-11-06 DIAGNOSIS — Z8551 Personal history of malignant neoplasm of bladder: Secondary | ICD-10-CM | POA: Diagnosis not present

## 2023-11-06 DIAGNOSIS — N3289 Other specified disorders of bladder: Secondary | ICD-10-CM | POA: Diagnosis not present

## 2023-11-10 ENCOUNTER — Ambulatory Visit (HOSPITAL_BASED_OUTPATIENT_CLINIC_OR_DEPARTMENT_OTHER)
Admission: RE | Admit: 2023-11-10 | Discharge: 2023-11-10 | Disposition: A | Discharge status: Home / Self Care | Source: Ambulatory Visit | Attending: Hematology & Oncology | Admitting: Hematology & Oncology

## 2023-11-10 DIAGNOSIS — Z8551 Personal history of malignant neoplasm of bladder: Secondary | ICD-10-CM | POA: Diagnosis not present

## 2023-11-10 DIAGNOSIS — K5730 Diverticulosis of large intestine without perforation or abscess without bleeding: Secondary | ICD-10-CM | POA: Diagnosis not present

## 2023-11-10 MED ORDER — GADOBUTROL 1 MMOL/ML IV SOLN
7.0000 mL | Freq: Once | INTRAVENOUS | Status: AC | PRN
  Administered 2023-11-10: 7 mL via INTRAVENOUS

## 2023-11-11 ENCOUNTER — Inpatient Hospital Stay: Payer: Medicare PPO | Admitting: Hematology & Oncology

## 2023-11-11 ENCOUNTER — Inpatient Hospital Stay: Payer: Medicare PPO | Attending: Hematology & Oncology

## 2023-11-11 VITALS — BP 127/57 | HR 78 | Temp 98.2°F | Resp 17 | Wt 161.0 lb

## 2023-11-11 DIAGNOSIS — Z7901 Long term (current) use of anticoagulants: Secondary | ICD-10-CM | POA: Insufficient documentation

## 2023-11-11 DIAGNOSIS — I743 Embolism and thrombosis of arteries of the lower extremities: Secondary | ICD-10-CM | POA: Diagnosis not present

## 2023-11-11 DIAGNOSIS — Z8551 Personal history of malignant neoplasm of bladder: Secondary | ICD-10-CM | POA: Diagnosis not present

## 2023-11-11 DIAGNOSIS — Z9221 Personal history of antineoplastic chemotherapy: Secondary | ICD-10-CM | POA: Diagnosis not present

## 2023-11-11 DIAGNOSIS — C679 Malignant neoplasm of bladder, unspecified: Secondary | ICD-10-CM | POA: Insufficient documentation

## 2023-11-11 DIAGNOSIS — Z08 Encounter for follow-up examination after completed treatment for malignant neoplasm: Secondary | ICD-10-CM

## 2023-11-11 DIAGNOSIS — R5383 Other fatigue: Secondary | ICD-10-CM | POA: Insufficient documentation

## 2023-11-11 DIAGNOSIS — Z79899 Other long term (current) drug therapy: Secondary | ICD-10-CM | POA: Diagnosis not present

## 2023-11-11 LAB — CBC WITH DIFFERENTIAL (CANCER CENTER ONLY)
Abs Immature Granulocytes: 0.01 10*3/uL (ref 0.00–0.07)
Basophils Absolute: 0 10*3/uL (ref 0.0–0.1)
Basophils Relative: 1 %
Eosinophils Absolute: 0.1 10*3/uL (ref 0.0–0.5)
Eosinophils Relative: 2 %
HCT: 41.9 % (ref 36.0–46.0)
Hemoglobin: 13.5 g/dL (ref 12.0–15.0)
Immature Granulocytes: 0 %
Lymphocytes Relative: 34 %
Lymphs Abs: 1.5 10*3/uL (ref 0.7–4.0)
MCH: 32.1 pg (ref 26.0–34.0)
MCHC: 32.2 g/dL (ref 30.0–36.0)
MCV: 99.8 fL (ref 80.0–100.0)
Monocytes Absolute: 0.3 10*3/uL (ref 0.1–1.0)
Monocytes Relative: 8 %
Neutro Abs: 2.4 10*3/uL (ref 1.7–7.7)
Neutrophils Relative %: 55 %
Platelet Count: 227 10*3/uL (ref 150–400)
RBC: 4.2 MIL/uL (ref 3.87–5.11)
RDW: 13.9 % (ref 11.5–15.5)
WBC Count: 4.3 10*3/uL (ref 4.0–10.5)
nRBC: 0 % (ref 0.0–0.2)

## 2023-11-11 LAB — CMP (CANCER CENTER ONLY)
ALT: 6 U/L (ref 0–44)
AST: 14 U/L — ABNORMAL LOW (ref 15–41)
Albumin: 4 g/dL (ref 3.5–5.0)
Alkaline Phosphatase: 68 U/L (ref 38–126)
Anion gap: 8 (ref 5–15)
BUN: 15 mg/dL (ref 8–23)
CO2: 27 mmol/L (ref 22–32)
Calcium: 10.2 mg/dL (ref 8.9–10.3)
Chloride: 104 mmol/L (ref 98–111)
Creatinine: 1.18 mg/dL — ABNORMAL HIGH (ref 0.44–1.00)
GFR, Estimated: 46 mL/min — ABNORMAL LOW (ref >60)
Glucose, Bld: 124 mg/dL — ABNORMAL HIGH (ref 70–99)
Potassium: 4 mmol/L (ref 3.5–5.1)
Sodium: 139 mmol/L (ref 135–145)
Total Bilirubin: 0.9 mg/dL (ref 0.0–1.2)
Total Protein: 6.6 g/dL (ref 6.5–8.1)

## 2023-11-11 NOTE — Progress Notes (Signed)
 Hematology and Oncology Follow Up Visit  Christina [name] [id number] 1941-07-11 83 y.o. 11/11/2023   Principle Diagnosis:  Stage IIIB (T1N3M0) invasive urothelial carcinoma of the bladder  Subsegmental blood clot in right lower lung  Current Therapy:   Status post neoadjuvant chemotherapy with ddMVAC Radical cystectomy on 03/03/2020  Xarelto 20 mg p.o. daily-6 months of therapy to start on 06/30/2021 -  changed to Xarelto 10 mg po q day on 12/26/2021     Interim History:  Christina [name] is back for follow-up.  We may have a little bit of a problem.  She had a CT scan done recently.  There was a questionable soft tissue nodularity in the right hemipelvis measuring 2.4 x 1.5 x 2.3 cm.  Radiologist of not sure what this is.  As such, we have ordered an MRI.  The MRI is not back yet.  She has had no abdominal pain.  There is no problems with her urostomy.  She has had no bowel issues.  She does feel tired.  She does have some fatigue.  Her back bothers her all the time.  She has had no fever.  She did did have the Flu recently.  She was given Tamiflu for this.  She continues on the Xarelto.  She is doing well on the Xarelto.  Her appetite is doing okay.  She has had no nausea or vomiting.  There is no leg swelling.  She has had no headache.  Overall, I would have to say that her performance status is ECOG 1.    Medications:  Current Outpatient Medications:    LORazepam (ATIVAN) 0.5 MG tablet, Take 1 tablet (0.5 mg total) by mouth 2 (two) times daily as needed for anxiety., Disp: 60 tablet, Rfl: 5   magnesium (MAGTAB) 84 MG ( ) TBCR SR tablet, Take 84 mg by mouth daily., Disp: , Rfl:    PARoxetine (PAXIL) 20 MG tablet, Take 1 tablet (20 mg total) by mouth daily., Disp: 90 tablet, Rfl: 1   VITAMIN D PO, Take 1,000 Int'l Units/1.56m2 by mouth., Disp: , Rfl:    XARELTO 10 MG TABS tablet, TAKE 1 TABLET BY MOUTH EVERY DAY, Disp: 90 tablet, Rfl: 3  Allergies:  Allergies  Allergen Reactions    Nutrasweet Aspartame [Aspartame] Diarrhea   Lisinopril Cough    Past Medical History, Surgical history, Social history, and Family History were reviewed and updated.  Review of Systems: Review of Systems  Constitutional: Negative.   HENT:  Negative.    Eyes: Negative.   Respiratory: Negative.    Cardiovascular: Negative.   Gastrointestinal: Negative.   Endocrine: Negative.   Genitourinary: Negative.    Musculoskeletal: Negative.   Skin: Negative.   Neurological: Negative.   Hematological: Negative.   Psychiatric/Behavioral: Negative.      Physical Exam:  weight is 161 lb (73 kg). Her oral temperature is 98.2 F (36.8 C). Her blood pressure is 127/57 (abnormal) and her pulse is 78. Her respiration is 17 and oxygen saturation is 98%.   Wt Readings from Last 3 Encounters:  11/11/23 161 lb (73 kg)  11/01/23 163 lb 9.6 oz (74.2 kg)  09/24/23 161 lb (73 kg)    Physical Exam Vitals reviewed.  HENT:     Head: Normocephalic and atraumatic.  Eyes:     Pupils: Pupils are equal, round, and reactive to light.  Cardiovascular:     Rate and Rhythm: Normal rate and regular rhythm.     Heart sounds: Normal heart sounds.  Pulmonary:     Effort: Pulmonary effort is normal.     Breath sounds: Normal breath sounds.  Abdominal:     General: Bowel sounds are normal.     Palpations: Abdomen is soft.     Comments: Abdominal exam shows the healed laparotomy scar in the midline.  She has some firmness in the middle of the laparotomy scar.  She has the urostomy bag which is well-positioned.  Urine is clear.  There is no fluid wave in the abdomen.  Bowel sounds are present.  She has no palpable liver or spleen tip.  Musculoskeletal:        General: No tenderness or deformity. Normal range of motion.     Cervical back: Normal range of motion.  Lymphadenopathy:     Cervical: No cervical adenopathy.  Skin:    General: Skin is warm and dry.     Findings: No erythema or rash.  Neurological:      Mental Status: She is alert and oriented to person, place, and time.  Psychiatric:        Behavior: Behavior normal.        Thought Content: Thought content normal.        Judgment: Judgment normal.     Lab Results  Component Value Date   WBC 4.3 11/11/2023   HGB 13.5 11/11/2023   HCT 41.9 11/11/2023   MCV 99.8 11/11/2023   PLT 227 11/11/2023     Chemistry      Component Value Date/Time   NA 139 11/11/2023 1218   NA 142 03/04/2017 1016   K 4.0 11/11/2023 1218   CL 104 11/11/2023 1218   CO2 27 11/11/2023 1218   BUN 15 11/11/2023 1218   BUN 13 03/04/2017 1016   CREATININE 1.18 (H) 11/11/2023 1218   CREATININE 0.78 05/20/2015 1704      Component Value Date/Time   CALCIUM 10.2 11/11/2023 1218   ALKPHOS 68 11/11/2023 1218   AST 14 (L) 11/11/2023 1218   ALT 6 11/11/2023 1218   BILITOT 0.9 11/11/2023 1218      Impression and Plan:  Christina [name] is a 83 year old white female.  She had superficial bladder cancer.  She was treated with intravesicular therapy.  However, she has had had muscle invasive cancer which was poorly differentiated.  She then underwent neoadjuvant chemotherapy followed by radical cystectomy.  She was found to have stage IIIB disease.  Again, we will have to await the MRI.  If the MRI does show a possible recurrence, we will probably have to get a a biopsy to confirm this.  I would really hate if she does have her recurrence.  She has no problems with her port back.  I would like to get her back sooner as a precaution just because what is possibly going on with the pelvis.  We will continue to stay strong in prayer for her.   Josph Macho, MD 3/17/20251:39 PM

## 2023-12-14 DIAGNOSIS — K9409 Other complications of colostomy: Secondary | ICD-10-CM | POA: Diagnosis not present

## 2023-12-14 DIAGNOSIS — Z936 Other artificial openings of urinary tract status: Secondary | ICD-10-CM | POA: Diagnosis not present

## 2023-12-14 DIAGNOSIS — C7911 Secondary malignant neoplasm of bladder: Secondary | ICD-10-CM | POA: Diagnosis not present

## 2023-12-30 ENCOUNTER — Other Ambulatory Visit: Payer: Self-pay

## 2023-12-30 ENCOUNTER — Inpatient Hospital Stay: Admitting: Hematology & Oncology

## 2023-12-30 ENCOUNTER — Inpatient Hospital Stay: Attending: Hematology & Oncology

## 2023-12-30 ENCOUNTER — Ambulatory Visit (HOSPITAL_BASED_OUTPATIENT_CLINIC_OR_DEPARTMENT_OTHER)
Admission: RE | Admit: 2023-12-30 | Discharge: 2023-12-30 | Disposition: A | Discharge status: Home / Self Care | Source: Ambulatory Visit | Attending: Hematology & Oncology | Admitting: Hematology & Oncology

## 2023-12-30 ENCOUNTER — Encounter: Payer: Self-pay | Admitting: Hematology & Oncology

## 2023-12-30 VITALS — BP 117/66 | HR 70 | Temp 98.1°F | Resp 16 | Ht 65.0 in | Wt 166.0 lb

## 2023-12-30 DIAGNOSIS — Z9221 Personal history of antineoplastic chemotherapy: Secondary | ICD-10-CM | POA: Diagnosis not present

## 2023-12-30 DIAGNOSIS — K5901 Slow transit constipation: Secondary | ICD-10-CM | POA: Diagnosis not present

## 2023-12-30 DIAGNOSIS — Z7901 Long term (current) use of anticoagulants: Secondary | ICD-10-CM | POA: Insufficient documentation

## 2023-12-30 DIAGNOSIS — Z79899 Other long term (current) drug therapy: Secondary | ICD-10-CM | POA: Diagnosis not present

## 2023-12-30 DIAGNOSIS — C679 Malignant neoplasm of bladder, unspecified: Secondary | ICD-10-CM | POA: Diagnosis not present

## 2023-12-30 DIAGNOSIS — T451X5A Adverse effect of antineoplastic and immunosuppressive drugs, initial encounter: Secondary | ICD-10-CM | POA: Diagnosis not present

## 2023-12-30 DIAGNOSIS — D6481 Anemia due to antineoplastic chemotherapy: Secondary | ICD-10-CM

## 2023-12-30 DIAGNOSIS — M47816 Spondylosis without myelopathy or radiculopathy, lumbar region: Secondary | ICD-10-CM | POA: Diagnosis not present

## 2023-12-30 DIAGNOSIS — G8929 Other chronic pain: Secondary | ICD-10-CM | POA: Diagnosis not present

## 2023-12-30 DIAGNOSIS — R5383 Other fatigue: Secondary | ICD-10-CM | POA: Insufficient documentation

## 2023-12-30 DIAGNOSIS — M549 Dorsalgia, unspecified: Secondary | ICD-10-CM | POA: Diagnosis not present

## 2023-12-30 DIAGNOSIS — M545 Low back pain, unspecified: Secondary | ICD-10-CM | POA: Insufficient documentation

## 2023-12-30 DIAGNOSIS — Z8551 Personal history of malignant neoplasm of bladder: Secondary | ICD-10-CM

## 2023-12-30 DIAGNOSIS — Z86711 Personal history of pulmonary embolism: Secondary | ICD-10-CM | POA: Insufficient documentation

## 2023-12-30 DIAGNOSIS — M419 Scoliosis, unspecified: Secondary | ICD-10-CM | POA: Diagnosis not present

## 2023-12-30 LAB — LACTATE DEHYDROGENASE: LDH: 130 U/L (ref 98–192)

## 2023-12-30 LAB — CBC WITH DIFFERENTIAL (CANCER CENTER ONLY)
Abs Immature Granulocytes: 0.01 10*3/uL (ref 0.00–0.07)
Basophils Absolute: 0 10*3/uL (ref 0.0–0.1)
Basophils Relative: 1 %
Eosinophils Absolute: 0.1 10*3/uL (ref 0.0–0.5)
Eosinophils Relative: 1 %
HCT: 39.9 % (ref 36.0–46.0)
Hemoglobin: 13 g/dL (ref 12.0–15.0)
Immature Granulocytes: 0 %
Lymphocytes Relative: 34 %
Lymphs Abs: 1.9 10*3/uL (ref 0.7–4.0)
MCH: 32.3 pg (ref 26.0–34.0)
MCHC: 32.6 g/dL (ref 30.0–36.0)
MCV: 99 fL (ref 80.0–100.0)
Monocytes Absolute: 0.5 10*3/uL (ref 0.1–1.0)
Monocytes Relative: 8 %
Neutro Abs: 3.1 10*3/uL (ref 1.7–7.7)
Neutrophils Relative %: 56 %
Platelet Count: 228 10*3/uL (ref 150–400)
RBC: 4.03 MIL/uL (ref 3.87–5.11)
RDW: 13.7 % (ref 11.5–15.5)
WBC Count: 5.6 10*3/uL (ref 4.0–10.5)
nRBC: 0 % (ref 0.0–0.2)

## 2023-12-30 LAB — CMP (CANCER CENTER ONLY)
ALT: 6 U/L (ref 0–44)
AST: 15 U/L (ref 15–41)
Albumin: 4 g/dL (ref 3.5–5.0)
Alkaline Phosphatase: 62 U/L (ref 38–126)
Anion gap: 6 (ref 5–15)
BUN: 18 mg/dL (ref 8–23)
CO2: 27 mmol/L (ref 22–32)
Calcium: 10 mg/dL (ref 8.9–10.3)
Chloride: 106 mmol/L (ref 98–111)
Creatinine: 1.16 mg/dL — ABNORMAL HIGH (ref 0.44–1.00)
GFR, Estimated: 47 mL/min — ABNORMAL LOW (ref >60)
Glucose, Bld: 165 mg/dL — ABNORMAL HIGH (ref 70–99)
Potassium: 3.6 mmol/L (ref 3.5–5.1)
Sodium: 139 mmol/L (ref 135–145)
Total Bilirubin: 0.7 mg/dL (ref 0.0–1.2)
Total Protein: 6.3 g/dL — ABNORMAL LOW (ref 6.5–8.1)

## 2023-12-30 LAB — VITAMIN B12: Vitamin B-12: 280 pg/mL (ref 180–914)

## 2023-12-30 LAB — TSH: TSH: 2.19 u[IU]/mL (ref 0.350–4.500)

## 2023-12-30 NOTE — Progress Notes (Signed)
 Hematology and Oncology Follow Up Visit  Christina [name] [id number] [birth year] 83 y.o. 12/30/2023   Principle Diagnosis:  Stage IIIB (T1N3M0) invasive urothelial carcinoma of the bladder  Subsegmental blood clot in right lower lung  Current Therapy:   Status post neoadjuvant chemotherapy with ddMVAC Radical cystectomy on 03/03/2020  Xarelto  20 mg p.o. daily-6 months of therapy to start on 06/30/2021 -  changed to Xarelto  10 mg po q day on 12/26/2021 -DC on 12/30/2023     Interim History:  Christina [name] is back for follow-up.  We last saw her back in March.  Unfortunately, she still is having some problems.  No problems with her lower back.  She feels tired all the time.  She does not have a lot of energy.    Thankfully, we have not found that there is any evidence of cancer.  She has had an MRI of the abdomen back in mid March.  This was negative for any obvious recurrent disease..  She is bothered by her back.  She is having quite a bit of back pain.  This has been chronic.  She has had injections in the past.  Is probably been a few years that she had injections.  She has had no issues with nausea or vomiting.  She says that when she does work in the yard, her back begins to burn and then she does get sick.  She has had no issues with fever.  There has been no bleeding.  Her urostomy seems to be working pretty well.  There has been no issues with leg swelling.  She has had no rashes.  We last checked her TSH back in December and was okay at 2.5.  Her last iron studies that were done in December showed a ferritin of 27 with an iron saturation of 35%.  Overall, I would have to say that her performance status is probably ECOG 2.     Medications:  Current Outpatient Medications:    LORazepam  (ATIVAN ) 0.5 MG tablet, Take 1 tablet (0.5 mg total) by mouth 2 (two) times daily as needed for anxiety., Disp: 60 tablet, Rfl: 5   magnesium (MAGTAB) 84 MG ( ) TBCR SR tablet, Take 84 mg by  mouth daily., Disp: , Rfl:    PARoxetine  (PAXIL ) 20 MG tablet, Take 1 tablet (20 mg total) by mouth daily., Disp: 90 tablet, Rfl: 1   VITAMIN D  PO, Take 1,000 Int'l Units/1.7m2 by mouth., Disp: , Rfl:    XARELTO  10 MG TABS tablet, TAKE 1 TABLET BY MOUTH EVERY DAY, Disp: 90 tablet, Rfl: 3  Allergies:  Allergies  Allergen Reactions   Nutrasweet Aspartame [Aspartame] Diarrhea   Lisinopril Cough    Past Medical History, Surgical history, Social history, and Family History were reviewed and updated.  Review of Systems: Review of Systems  Constitutional: Negative.   HENT:  Negative.    Eyes: Negative.   Respiratory: Negative.    Cardiovascular: Negative.   Gastrointestinal: Negative.   Endocrine: Negative.   Genitourinary: Negative.    Musculoskeletal: Negative.   Skin: Negative.   Neurological: Negative.   Hematological: Negative.   Psychiatric/Behavioral: Negative.      Physical Exam:  height is 5\' 5"  (1.651 m) and weight is 166 lb (75.3 kg). Her oral temperature is 98.1 F (36.7 C). Her blood pressure is 117/66 and her pulse is 70. Her respiration is 16 and oxygen saturation is 98%.   Wt Readings from Last 3 Encounters:  12/30/23 166 lb (  75.3 kg)  11/11/23 161 lb (73 kg)  11/01/23 163 lb 9.6 oz (74.2 kg)    Physical Exam Vitals reviewed.  HENT:     Head: Normocephalic and atraumatic.  Eyes:     Pupils: Pupils are equal, round, and reactive to light.  Cardiovascular:     Rate and Rhythm: Normal rate and regular rhythm.     Heart sounds: Normal heart sounds.  Pulmonary:     Effort: Pulmonary effort is normal.     Breath sounds: Normal breath sounds.  Abdominal:     General: Bowel sounds are normal.     Palpations: Abdomen is soft.     Comments: Abdominal exam shows the healed laparotomy scar in the midline.  She has some firmness in the middle of the laparotomy scar.  She has the urostomy bag which is well-positioned.  Urine is clear.  There is no fluid wave in the  abdomen.  Bowel sounds are present.  She has no palpable liver or spleen tip.  Musculoskeletal:        General: No tenderness or deformity. Normal range of motion.     Cervical back: Normal range of motion.  Lymphadenopathy:     Cervical: No cervical adenopathy.  Skin:    General: Skin is warm and dry.     Findings: No erythema or rash.  Neurological:     Mental Status: She is alert and oriented to person, place, and time.  Psychiatric:        Behavior: Behavior normal.        Thought Content: Thought content normal.        Judgment: Judgment normal.     Lab Results  Component Value Date   WBC 5.6 12/30/2023   HGB 13.0 12/30/2023   HCT 39.9 12/30/2023   MCV 99.0 12/30/2023   PLT 228 12/30/2023     Chemistry      Component Value Date/Time   NA 139 12/30/2023 1203   NA 142 03/04/2017 1016   K 3.6 12/30/2023 1203   CL 106 12/30/2023 1203   CO2 27 12/30/2023 1203   BUN 18 12/30/2023 1203   BUN 13 03/04/2017 1016   CREATININE 1.16 (H) 12/30/2023 1203   CREATININE 0.78 05/20/2015 1704      Component Value Date/Time   CALCIUM 10.0 12/30/2023 1203   ALKPHOS 62 12/30/2023 1203   AST 15 12/30/2023 1203   ALT 6 12/30/2023 1203   BILITOT 0.7 12/30/2023 1203      Impression and Plan:  Christina [name] is a 83 year old white female.  She had superficial bladder cancer.  She was treated with intravesicular therapy.  However, she has had had muscle invasive cancer which was poorly differentiated.  She then underwent neoadjuvant chemotherapy followed by radical cystectomy.  She was found to have stage IIIB disease.  I told her to stop the Xarelto .  She has been on Xarelto  now for about 2 years.  I think this is good enough for us  with respect to the pulmonary embolism.  We will go ahead and get set up with x-rays of her back.  Maybe, this will show something.  She probably needs to go back to orthopedic surgery.  Again she has seen Dr. Rexanne Catalina of [facility] and has had  injections.  Again, I do not see any obvious cancer that is the problem.  However, this is always a possibility despite the negative scans.  I am not sure what else we can do about the  fatigue and lack of energy.  We are checking a TSH.  I will check her B12 level.  I told her that she really needs to go back to see her family doctor about this issue.  Hopefully she will.  We will plan to get her back to see us  in another couple months or so.   Ivor Mars, MD 5/5/20251:47 PM

## 2023-12-31 ENCOUNTER — Telehealth: Payer: Self-pay

## 2023-12-31 NOTE — Telephone Encounter (Signed)
-----   Message from Ivor Mars sent at 12/31/2023  2:12 PM EDT ----- Please call and let her know that x-ray shows that she does have significant arthritis in the spine.  There is no cancer.  Twilla Galea

## 2023-12-31 NOTE — Telephone Encounter (Signed)
 Called patient and leaved a message on her voicemail. Informed her that the x-ray shows that she does have significant arthritis in the spine, but that there is no cancer

## 2024-01-01 ENCOUNTER — Ambulatory Visit

## 2024-01-01 DIAGNOSIS — Z8781 Personal history of (healed) traumatic fracture: Secondary | ICD-10-CM | POA: Diagnosis not present

## 2024-01-01 DIAGNOSIS — E559 Vitamin D deficiency, unspecified: Secondary | ICD-10-CM

## 2024-01-01 DIAGNOSIS — Z780 Asymptomatic menopausal state: Secondary | ICD-10-CM

## 2024-01-01 DIAGNOSIS — Z1231 Encounter for screening mammogram for malignant neoplasm of breast: Secondary | ICD-10-CM | POA: Diagnosis not present

## 2024-01-01 DIAGNOSIS — M810 Age-related osteoporosis without current pathological fracture: Secondary | ICD-10-CM

## 2024-01-01 DIAGNOSIS — Z13820 Encounter for screening for osteoporosis: Secondary | ICD-10-CM | POA: Diagnosis not present

## 2024-01-02 ENCOUNTER — Encounter: Payer: Self-pay | Admitting: Family Medicine

## 2024-01-03 ENCOUNTER — Encounter: Payer: Self-pay | Admitting: Family Medicine

## 2024-01-06 DIAGNOSIS — H04123 Dry eye syndrome of bilateral lacrimal glands: Secondary | ICD-10-CM | POA: Diagnosis not present

## 2024-01-06 DIAGNOSIS — H43813 Vitreous degeneration, bilateral: Secondary | ICD-10-CM | POA: Diagnosis not present

## 2024-01-06 DIAGNOSIS — Z961 Presence of intraocular lens: Secondary | ICD-10-CM | POA: Diagnosis not present

## 2024-01-06 DIAGNOSIS — H40013 Open angle with borderline findings, low risk, bilateral: Secondary | ICD-10-CM | POA: Diagnosis not present

## 2024-01-15 ENCOUNTER — Ambulatory Visit (INDEPENDENT_AMBULATORY_CARE_PROVIDER_SITE_OTHER): Payer: Medicare PPO | Admitting: *Deleted

## 2024-01-15 DIAGNOSIS — Z0000 Encounter for general adult medical examination without abnormal findings: Secondary | ICD-10-CM | POA: Diagnosis not present

## 2024-01-15 NOTE — Patient Instructions (Signed)
 Christina [name] , Thank you for taking time to come for your Medicare Wellness Visit. I appreciate your ongoing commitment to your health goals. Please review the following plan we discussed and let me know if I can assist you in the future.   Screening recommendations/referrals: Colonoscopy: no longer required Mammogram: up to date Bone Density: up to date Recommended yearly ophthalmology/optometry visit for glaucoma screening and checkup Recommended yearly dental visit for hygiene and checkup  Vaccinations: Influenza vaccine: up to date Pneumococcal vaccine: up to date Tdap vaccine: up to date     Advanced directives: yes not on file     Preventive Care 65 Years and Older, Female Preventive care refers to lifestyle choices and visits with your health care provider that can promote health and wellness. What does preventive care include? A yearly physical exam. This is also called an annual well check. Dental exams once or twice a year. Routine eye exams. Ask your health care provider how often you should have your eyes checked. Personal lifestyle choices, including: Daily care of your teeth and gums. Regular physical activity. Eating a healthy diet. Avoiding tobacco and drug use. Limiting alcohol use. Practicing safe sex. Taking low-dose aspirin every day. Taking vitamin and mineral supplements as recommended by your health care provider. What happens during an annual well check? The services and screenings done by your health care provider during your annual well check will depend on your age, overall health, lifestyle risk factors, and family history of disease. Counseling  Your health care provider may ask you questions about your: Alcohol use. Tobacco use. Drug use. Emotional well-being. Home and relationship well-being. Sexual activity. Eating habits. History of falls. Memory and ability to understand (cognition). Work and work Astronomer. Reproductive  health. Screening  You may have the following tests or measurements: Height, weight, and BMI. Blood pressure. Lipid and cholesterol levels. These may be checked every 5 years, or more frequently if you are over 82 years old. Skin check. Lung cancer screening. You may have this screening every year starting at age 82 if you have a 30-pack-year history of smoking and currently smoke or have quit within the past 15 years. Fecal occult blood test (FOBT) of the stool. You may have this test every year starting at age 82. Flexible sigmoidoscopy or colonoscopy. You may have a sigmoidoscopy every 5 years or a colonoscopy every 10 years starting at age 82. Hepatitis C blood test. Hepatitis B blood test. Sexually transmitted disease (STD) testing. Diabetes screening. This is done by checking your blood sugar (glucose) after you have not eaten for a while (fasting). You may have this done every 1-3 years. Bone density scan. This is done to screen for osteoporosis. You may have this done starting at age 82. Mammogram. This may be done every 1-2 years. Talk to your health care provider about how often you should have regular mammograms. Talk with your health care provider about your test results, treatment options, and if necessary, the need for more tests. Vaccines  Your health care provider may recommend certain vaccines, such as: Influenza vaccine. This is recommended every year. Tetanus, diphtheria, and acellular pertussis (Tdap, Td) vaccine. You may need a Td booster every 10 years. Zoster vaccine. You may need this after age 82. Pneumococcal 13-valent conjugate (PCV13) vaccine. One dose is recommended after age 82. Pneumococcal polysaccharide (PPSV23) vaccine. One dose is recommended after age 82. Talk to your health care provider about which screenings and vaccines you need and how often  you need them. This information is not intended to replace advice given to you by your health care provider.  Make sure you discuss any questions you have with your health care provider. Document Released: 09/09/2015 Document Revised: 05/02/2016 Document Reviewed: 06/14/2015 Elsevier Interactive Patient Education  2017 [employer].  Fall Prevention in the Home Falls can cause injuries. They can happen to people of all ages. There are many things you can do to make your home safe and to help prevent falls. What can I do on the outside of my home? Regularly fix the edges of walkways and driveways and fix any cracks. Remove anything that might make you trip as you walk through a door, such as a raised step or threshold. Trim any bushes or trees on the path to your home. Use bright outdoor lighting. Clear any walking paths of anything that might make someone trip, such as rocks or tools. Regularly check to see if handrails are loose or broken. Make sure that both sides of any steps have handrails. Any raised decks and porches should have guardrails on the edges. Have any leaves, snow, or ice cleared regularly. Use sand or salt on walking paths during winter. Clean up any spills in your garage right away. This includes oil or grease spills. What can I do in the bathroom? Use night lights. Install grab bars by the toilet and in the tub and shower. Do not use towel bars as grab bars. Use non-skid mats or decals in the tub or shower. If you need to sit down in the shower, use a plastic, non-slip stool. Keep the floor dry. Clean up any water that spills on the floor as soon as it happens. Remove soap buildup in the tub or shower regularly. Attach bath mats securely with double-sided non-slip rug tape. Do not have throw rugs and other things on the floor that can make you trip. What can I do in the bedroom? Use night lights. Make sure that you have a light by your bed that is easy to reach. Do not use any sheets or blankets that are too big for your bed. They should not hang down onto the floor. Have a  firm chair that has side arms. You can use this for support while you get dressed. Do not have throw rugs and other things on the floor that can make you trip. What can I do in the kitchen? Clean up any spills right away. Avoid walking on wet floors. Keep items that you use a lot in easy-to-reach places. If you need to reach something above you, use a strong step stool that has a grab bar. Keep electrical cords out of the way. Do not use floor polish or wax that makes floors slippery. If you must use wax, use non-skid floor wax. Do not have throw rugs and other things on the floor that can make you trip. What can I do with my stairs? Do not leave any items on the stairs. Make sure that there are handrails on both sides of the stairs and use them. Fix handrails that are broken or loose. Make sure that handrails are as long as the stairways. Check any carpeting to make sure that it is firmly attached to the stairs. Fix any carpet that is loose or worn. Avoid having throw rugs at the top or bottom of the stairs. If you do have throw rugs, attach them to the floor with carpet tape. Make sure that you have a light  switch at the top of the stairs and the bottom of the stairs. If you do not have them, ask someone to add them for you. What else can I do to help prevent falls? Wear shoes that: Do not have high heels. Have rubber bottoms. Are comfortable and fit you well. Are closed at the toe. Do not wear sandals. If you use a stepladder: Make sure that it is fully opened. Do not climb a closed stepladder. Make sure that both sides of the stepladder are locked into place. Ask someone to hold it for you, if possible. Clearly mark and make sure that you can see: Any grab bars or handrails. First and last steps. Where the edge of each step is. Use tools that help you move around (mobility aids) if they are needed. These include: Canes. Walkers. Scooters. Crutches. Turn on the lights when you  go into a dark area. Replace any light bulbs as soon as they burn out. Set up your furniture so you have a clear path. Avoid moving your furniture around. If any of your floors are uneven, fix them. If there are any pets around you, be aware of where they are. Review your medicines with your doctor. Some medicines can make you feel dizzy. This can increase your chance of falling. Ask your doctor what other things that you can do to help prevent falls. This information is not intended to replace advice given to you by your health care provider. Make sure you discuss any questions you have with your health care provider. Document Released: 06/09/2009 Document Revised: 01/19/2016 Document Reviewed: 09/17/2014 Elsevier Interactive Patient Education  2017 [employer].

## 2024-01-15 NOTE — Progress Notes (Signed)
 Subjective:   Christina [name] is a 83 y.o. female who presents for Medicare Annual (Subsequent) preventive examination.  Visit Complete: Virtual I connected with  Christina [name] on 01/15/24 by a audio enabled telemedicine application and verified that I am speaking with the correct person using two identifiers.  Patient Location: Home  Provider Location: Home Office  I discussed the limitations of evaluation and management by telemedicine. The patient expressed understanding and agreed to proceed.  Vital Signs: Because this visit was a virtual/telehealth visit, some criteria may be missing or patient reported. Any vitals not documented were not able to be obtained and vitals that have been documented are patient reported.   Cardiac Risk Factors include: advanced age (>30men, >20 women)     Objective:     Today's Vitals   01/15/24 1352  PainSc: 6    There is no height or weight on file to calculate BMI.     01/15/2024    1:55 PM 12/30/2023    1:15 PM 11/11/2023   12:57 PM 08/01/2023   12:33 PM 06/05/2023   12:26 PM 01/09/2023    1:25 PM 01/02/2023   12:19 PM  Advanced Directives  Does Patient Have a Medical Advance Directive? Yes Yes Yes Yes Yes Yes Yes  Type of Advance Directive Healthcare Power of Attorney Living will Living will Living will Healthcare Power of [place];Living will Healthcare Power of [place];Living will Healthcare Power of [place];Living will  Does patient want to make changes to medical advance directive?  No - Patient declined  No - Patient declined     Copy of Healthcare Power of Attorney in Chart? No - copy requested  No - copy requested  No - copy requested No - copy requested No - copy requested    Current Medications (verified) Outpatient Encounter Medications as of 01/15/2024  Medication Sig   LORazepam  (ATIVAN ) 0.5 MG tablet Take 1 tablet (0.5 mg total) by mouth 2 (two) times daily as needed for anxiety.   magnesium (MAGTAB) 84 MG ( )  TBCR SR tablet Take 84 mg by mouth daily.   PARoxetine  (PAXIL ) 20 MG tablet Take 1 tablet (20 mg total) by mouth daily.   VITAMIN D  PO Take 1,000 Int'l Units/1.7m2 by mouth.   No facility-administered encounter medications on file as of 01/15/2024.    Allergies (verified) Nutrasweet aspartame [aspartame] and Lisinopril   History: Past Medical History:  Diagnosis Date   Anal fissure 10/24/2021   Arthritis    Asthma    a little?, no problems in several years   Bladder cancer (HCC)    Bladder tumor    Cervical cancer screening 01/31/2012   Chicken pox as a child   Chronic idiopathic constipation 10/24/2021   Degenerative tear of acetabular labrum of left hip 02/26/2019   Dehydration 01/19/2020   Depression with anxiety 06/07/2009   Qualifier: Diagnosis of  By: Elisama Thissen Leak MD, Catherine     Dermatitis of external ear 07/11/2012   Diverticulosis    Essential hypertension, benign 10/13/2007   Qualifier: Diagnosis of  By: Javian Nudd Leak MD, Claudeen Crutch cyst 02/26/2019   Right elbow   Hiatal hernia with gastroesophageal reflux 10/26/2010   Qualifier: Diagnosis of  By: Rayna Brenner Leak MD, Catherine     Hyperglycemia 06/21/2013   Hyperlipidemia, mixed 10/16/2015   pt unaware   Hypertension    Left hip pain 07/10/2015   Lesion of breast 12/03/2016   benign; resolved   Low back pain 05/29/2015  Measles as a child   Mumps as a child   Osteopenia 01/03/2017   Overweight (BMI 25.0-29.9) 10/07/2008   Qualifier: Diagnosis of  By: Azeneth Carbonell Leak MD, Bynum Cassis     Palpitations    several years ago, not currently   Pre-diabetes    Psoriasis    ears   Rectal bleeding 10/24/2021   RUQ pain 05/29/2015   Spider veins    Bilateral legs   Spinal stenosis    Tailor's bunionette, left 09/02/2017   Umbilical hernia    Urinary frequency 09/28/2011   Vertigo    Wears dentures    Upper,   Past Surgical History:  Procedure Laterality Date   ABDOMINAL SURGERY  1970's   BREAST BIOPSY Right 1980    BREAST EXCISIONAL BIOPSY   BREAST BIOPSY Left 1970   BREAST EXCISIONAL BIOPSY   BREAST CYST ASPIRATION     CATARACT EXTRACTION, BILATERAL     COLONOSCOPY     CYSTECTOMY     abdomen   CYSTOSCOPY W/ RETROGRADES Bilateral 04/27/2019   Procedure: CYSTOSCOPY WITH RETROGRADE PYELOGRAM;  Surgeon: Marco Severs, MD;  Location: [facility];  Service: Urology;  Laterality: Bilateral;   EYE SURGERY Bilateral    cataract   INSERTION OF MESH N/A 06/11/2017   Procedure: INSERTION OF MESH;  Surgeon: Enid Harry, MD;  Location: [facility] OR;  Service: General;  Laterality: N/A;  BILATERAL TAP BLOCK   IR REMOVAL TUN ACCESS W/ PORT W/O FL MOD SED  11/09/2020   TRANSURETHRAL RESECTION OF BLADDER TUMOR N/A 04/27/2019   Procedure: TRANSURETHRAL RESECTION OF BLADDER TUMOR (TURBT);  Surgeon: Marco Severs, MD;  Location: [facility];  Service: Urology;  Laterality: N/A;  1 HR   TRANSURETHRAL RESECTION OF BLADDER TUMOR N/A 06/01/2019   Procedure: TRANSURETHRAL RESECTION OF BLADDER TUMOR (TURBT);  Surgeon: Marco Severs, MD;  Location: [facility];  Service: Urology;  Laterality: N/A;  1 HR   TRANSURETHRAL RESECTION OF BLADDER TUMOR N/A 10/22/2019   Procedure: TRANSURETHRAL RESECTION OF BLADDER TUMOR (TURBT);  Surgeon: Marco Severs, MD;  Location: [facility];  Service: Urology;  Laterality: N/A;   TUBAL LIGATION  1970   VENTRAL HERNIA REPAIR N/A 06/11/2017   Procedure: LAPAROSCOPIC VENTRAL HERNIA REPAIR WITH MESH ERAS PATHWAY;  Surgeon: Enid Harry, MD;  Location: [facility];  Service: General;  Laterality: N/A;  BILATERAL TAP BLOCK   Family History  Problem Relation Age of Onset   Hypertension Mother    Anxiety disorder Mother    Ovarian cancer Mother 61       lung- smoker, ovarian   Lung cancer Mother        smoker   Arthritis Mother    Hyperlipidemia Mother    Alcohol abuse Father    Diabetes Sister    Hypertension  Sister    Ovarian cancer Sister    Arthritis Sister    Depression Sister    Hyperlipidemia Sister    Asthma Sister    Depression Sister    Hyperlipidemia Sister    Breast cancer Sister    Asthma Sister    Depression Sister    Arthritis Brother    COPD Brother    Heart attack Brother    Hyperlipidemia Brother    Stroke Brother    Prostate cancer Brother    Other Maternal Grandmother        pacemaker   Multiple sclerosis Maternal Grandfather        ?  Asthma Paternal Uncle    Social History   Socioeconomic History   Marital status: Widowed    Spouse name: Not on file   Number of children: 2   Years of education: Not on file   Highest education level: Not on file  Occupational History   Occupation: retired  Tobacco Use   Smoking status: Former    Current packs/day: 0.00    Average packs/day: 1 pack/day for 30.0 years (30.0 ttl pk-yrs)    Types: Cigarettes    Start date: 08/27/1960    Quit date: 08/27/1990    Years since quitting: 33.4   Smokeless tobacco: Never  Vaping Use   Vaping status: Never Used  Substance and Sexual Activity   Alcohol use: No   Drug use: No   Sexual activity: Not Currently    Birth control/protection: Post-menopausal    Comment: lives alone, no dietary restrictions  Other Topics Concern   Not on file  Social History Narrative   Retired. Lives alone.    Attended some business college.    Former smoker.    Smoke alarm in the home. Wears seat balt.    Wears dentures.    Feels safe in her relationships.       Social Drivers of Corporate investment banker Strain: Low Risk  (01/15/2024)   Overall Financial Resource Strain (CARDIA)    Difficulty of Paying Living Expenses: Not hard at all  Food Insecurity: No Food Insecurity (01/15/2024)   Hunger Vital Sign    Worried About Running Out of Food in the Last Year: Never true    Ran Out of Food in the Last Year: Never true  Transportation Needs: No Transportation Needs (01/15/2024)   PRAPARE -  Administrator, Civil Service (Medical): No    Lack of Transportation (Non-Medical): No  Physical Activity: Insufficiently Active (01/15/2024)   Exercise Vital Sign    Days of Exercise per Week: 4 days    Minutes of Exercise per Session: 20 min  Stress: No Stress Concern Present (01/15/2024)   [employer] of Occupational Health - Occupational Stress Questionnaire    Feeling of Stress : Only a little  Social Connections: Moderately Integrated (01/15/2024)   Social Connection and Isolation Panel [NHANES]    Frequency of Communication with Friends and Family: More than three times a week    Frequency of Social Gatherings with Friends and Family: More than three times a week    Attends Religious Services: More than 4 times per year    Active Member of [employer] or Organizations: Yes    Attends Banker Meetings: More than 4 times per year    Marital Status: Widowed    Tobacco Counseling Counseling given: Not Answered   Clinical Intake:  Pre-visit preparation completed: Yes  Pain : 0-10 Pain Score: 6  Pain Type: Chronic pain Pain Location: Back Pain Descriptors / Indicators: Burning, Aching, Discomfort Pain Frequency: Intermittent     Diabetes: No  How often do you need to have someone help you when you read instructions, pamphlets, or other written materials from your doctor or pharmacy?: 1 - Never  Interpreter Needed?: No  Information entered by :: Kieth Pelt LPN   Activities of Daily Living    01/15/2024    1:57 PM  In your present state of health, do you have any difficulty performing the following activities:  Hearing? 0  Vision? 0  Difficulty concentrating or making decisions? 0  Walking or  climbing stairs? 1  Dressing or bathing? 0  Doing errands, shopping? 0  Preparing Food and eating ? N  Using the Toilet? N  In the past six months, have you accidently leaked urine? Y  Do you have problems with loss of bowel control? N  Managing  your Medications? N  Managing your Finances? N  Housekeeping or managing your Housekeeping? N    Patient Care Team: Mariel Shope, DO as PCP - General (Family Medicine) Enid Harry, MD as Consulting Physician (General Surgery) Amedeo Jupiter, MD as Consulting Physician (Ophthalmology) Euel Herring, DDS as Consulting Physician (Dentistry) Tami Falcon, MD as Consulting Physician (Gastroenterology) Maria Shiner Sherryll Donald, MD as Medical Oncologist (Oncology) Adelaide Adjutant, MD as Consulting Physician (Physical Medicine and Rehabilitation) Isidro Margo, DO as Consulting Physician (Sports Medicine) Roxane Copp, MD as Consulting Physician (Urology)  Indicate any recent Medical Services you may have received from other than [facility] providers in the past year (date may be approximate).     Assessment:    This is a routine wellness examination for Bernardine.  Hearing/Vision screen Hearing Screening - Comments:: No trouble hearing  Vision Screening - Comments:: Lasandra Points  Up to date   Goals Addressed             This Visit's Progress    Patient Stated       Get back stop hurting       Depression Screen    01/15/2024    2:00 PM 11/01/2023   11:26 AM 04/23/2023   11:03 AM 04/16/2023    1:32 PM 01/09/2023    1:24 PM 08/17/2022    9:09 AM 04/10/2022   11:13 AM  PHQ 2/9 Scores  PHQ - 2 Score 1 2 3   0 0 2  PHQ- 9 Score 6 10 15    0 10  Exception Documentation    Other- indicate reason in comment box     Not completed    no show       Fall Risk    01/15/2024    1:53 PM 11/01/2023   11:26 AM 04/23/2023   11:03 AM 08/17/2022    9:10 AM 01/08/2022    3:06 PM  Fall Risk   Falls in the past year? 1 0 0 0 1  Number falls in past yr: 0  0  0  Injury with Fall? 0  0  0  Risk for fall due to :   No Fall Risks No Fall Risks   Follow up Falls evaluation completed;Education provided;Falls prevention discussed Falls evaluation completed Falls evaluation completed Falls evaluation  completed     MEDICARE RISK AT HOME: Medicare Risk at Home Any stairs in or around the home?: Yes If so, are there any without handrails?: No Home free of loose throw rugs in walkways, pet beds, electrical cords, etc?: Yes Adequate lighting in your home to reduce risk of falls?: Yes Life alert?: No Use of a cane, walker or w/c?: No Grab bars in the bathroom?: Yes Shower chair or bench in shower?: No Elevated toilet seat or a handicapped toilet?: Yes  TIMED UP AND GO:  Was the test performed?  No    Cognitive Function:    10/05/2016    9:44 AM  MMSE - Mini Mental State Exam  Orientation to time 5  Orientation to Place 5  Registration 3  Attention/ Calculation 5  Recall 3  Language- name 2 objects 2  Language- repeat 1  Language- follow 3  step command 3  Language- read & follow direction 1  Write a sentence 1  Copy design 1  Total score 30        01/15/2024    1:56 PM 01/09/2023    1:26 PM 01/03/2022    1:44 PM 12/28/2020    1:47 PM  6CIT Screen  What Year? 0 points 0 points 0 points 0 points  What month? 0 points 0 points 0 points 0 points  What time? 0 points 0 points 0 points 0 points  Count back from 20 0 points 0 points 0 points 0 points  Months in reverse 0 points 0 points 0 points 2 points  Repeat phrase 0 points 0 points 0 points 0 points  Total Score 0 points 0 points 0 points 2 points    Immunizations Immunization History  Administered Date(s) Administered   Influenza Whole 06/07/2009, 05/28/2011, 05/28/2012   Influenza, High Dose Seasonal PF 05/20/2015   Influenza,inj,Quad PF,6+ Mos 05/26/2013, 05/31/2014   Moderna Sars-Covid-2 Vaccination 12/11/2019, 02/12/2020, 09/27/2020   Pneumococcal Conjugate-13 05/31/2014   Pneumococcal Polysaccharide-23 06/07/2009   Td 01/31/2003   Td (Adult), 2 Lf Tetanus Toxid, Preservative Free 01/31/2003   Tdap 09/28/2011, 10/24/2021    TDAP status: Up to date  Flu Vaccine status: Up to date  Pneumococcal vaccine  status: Up to date  Covid-19 vaccine status: Information provided on how to obtain vaccines.   Qualifies for Shingles Vaccine? Yes   Zostavax completed No   Shingrix Completed?: No.    Education has been provided regarding the importance of this vaccine. Patient has been advised to call insurance company to determine out of pocket expense if they have not yet received this vaccine. Advised may also receive vaccine at local pharmacy or Health Dept. Verbalized acceptance and understanding.  Screening Tests Health Maintenance  Topic Date Due   INFLUENZA VACCINE  03/27/2024   MAMMOGRAM  12/31/2024   Medicare Annual Wellness (AWV)  01/14/2025   DEXA SCAN  12/31/2025   DTaP/Tdap/Td (5 - Td or Tdap) 10/25/2031   Pneumonia Vaccine 83+ Years old  Completed   HPV VACCINES  Aged Out   Meningococcal B Vaccine  Aged Out   COVID-19 Vaccine  Discontinued   Zoster Vaccines- Shingrix  Discontinued    Health Maintenance  There are no preventive care reminders to display for this patient.   Colorectal cancer screening: No longer required.   Mammogram status: Completed  . Repeat every year  Bone Density status: Completed 2025. Results reflect: Bone density results: OSTEOPOROSIS. Repeat every 2 years.  Lung Cancer Screening: (Low Dose CT Chest recommended if Age 51-80 years, 20 pack-year currently smoking OR have quit w/in 15years.) does not qualify.   Lung Cancer Screening Referral:   Additional Screening:  Hepatitis C Screening: does not qualify;  Vision Screening: Recommended annual ophthalmology exams for early detection of glaucoma and other disorders of the eye. Is the patient up to date with their annual eye exam?  Yes  Who is the provider or what is the name of the office in which the patient attends annual eye exams? [facility] If pt is not established with a provider, would they like to be referred to a provider to establish care? No .   Dental Screening: Recommended annual dental  exams for proper oral hygiene  Community Resource Referral / Chronic Care Management: CRR required this visit?  No   CCM required this visit?  No     Plan:  I have personally reviewed and noted the following in the patient's chart:   Medical and social history Use of alcohol, tobacco or illicit drugs  Current medications and supplements including opioid prescriptions. Patient is not currently taking opioid prescriptions. Functional ability and status Nutritional status Physical activity Advanced directives List of other physicians Hospitalizations, surgeries, and ER visits in previous 12 months Vitals Screenings to include cognitive, depression, and falls Referrals and appointments  In addition, I have reviewed and discussed with patient certain preventive protocols, quality metrics, and best practice recommendations. A written personalized care plan for preventive services as well as general preventive health recommendations were provided to patient.     Kieth Pelt, LPN   04/06/9146   After Visit Summary: (MyChart) Due to this being a telephonic visit, the after visit summary with patients personalized plan was offered to patient via MyChart   Nurse Notes:

## 2024-01-24 ENCOUNTER — Telehealth: Payer: Self-pay

## 2024-01-24 NOTE — Telephone Encounter (Signed)
 Faxed referral to [facility]/Dr. Susana Enter beane.

## 2024-02-21 ENCOUNTER — Other Ambulatory Visit: Payer: Self-pay | Admitting: *Deleted

## 2024-02-21 DIAGNOSIS — Z8551 Personal history of malignant neoplasm of bladder: Secondary | ICD-10-CM

## 2024-02-24 ENCOUNTER — Inpatient Hospital Stay: Attending: Hematology & Oncology

## 2024-02-24 ENCOUNTER — Encounter: Payer: Self-pay | Admitting: Hematology & Oncology

## 2024-02-24 ENCOUNTER — Inpatient Hospital Stay: Admitting: Hematology & Oncology

## 2024-02-24 VITALS — BP 107/81 | HR 75 | Temp 97.8°F | Resp 18 | Ht 65.0 in | Wt 162.0 lb

## 2024-02-24 DIAGNOSIS — T451X5A Adverse effect of antineoplastic and immunosuppressive drugs, initial encounter: Secondary | ICD-10-CM | POA: Diagnosis not present

## 2024-02-24 DIAGNOSIS — Z7901 Long term (current) use of anticoagulants: Secondary | ICD-10-CM | POA: Insufficient documentation

## 2024-02-24 DIAGNOSIS — Z8551 Personal history of malignant neoplasm of bladder: Secondary | ICD-10-CM

## 2024-02-24 DIAGNOSIS — D6481 Anemia due to antineoplastic chemotherapy: Secondary | ICD-10-CM | POA: Diagnosis not present

## 2024-02-24 DIAGNOSIS — R5383 Other fatigue: Secondary | ICD-10-CM | POA: Insufficient documentation

## 2024-02-24 DIAGNOSIS — C679 Malignant neoplasm of bladder, unspecified: Secondary | ICD-10-CM | POA: Diagnosis not present

## 2024-02-24 DIAGNOSIS — E559 Vitamin D deficiency, unspecified: Secondary | ICD-10-CM | POA: Diagnosis not present

## 2024-02-24 DIAGNOSIS — Z79899 Other long term (current) drug therapy: Secondary | ICD-10-CM | POA: Insufficient documentation

## 2024-02-24 DIAGNOSIS — Z9221 Personal history of antineoplastic chemotherapy: Secondary | ICD-10-CM | POA: Diagnosis not present

## 2024-02-24 DIAGNOSIS — Z86711 Personal history of pulmonary embolism: Secondary | ICD-10-CM | POA: Diagnosis not present

## 2024-02-24 LAB — CBC WITH DIFFERENTIAL (CANCER CENTER ONLY)
Abs Immature Granulocytes: 0.02 10*3/uL (ref 0.00–0.07)
Basophils Absolute: 0 10*3/uL (ref 0.0–0.1)
Basophils Relative: 1 %
Eosinophils Absolute: 0.1 10*3/uL (ref 0.0–0.5)
Eosinophils Relative: 2 %
HCT: 40 % (ref 36.0–46.0)
Hemoglobin: 13.1 g/dL (ref 12.0–15.0)
Immature Granulocytes: 0 %
Lymphocytes Relative: 38 %
Lymphs Abs: 1.9 10*3/uL (ref 0.7–4.0)
MCH: 31.5 pg (ref 26.0–34.0)
MCHC: 32.8 g/dL (ref 30.0–36.0)
MCV: 96.2 fL (ref 80.0–100.0)
Monocytes Absolute: 0.4 10*3/uL (ref 0.1–1.0)
Monocytes Relative: 8 %
Neutro Abs: 2.5 10*3/uL (ref 1.7–7.7)
Neutrophils Relative %: 51 %
Platelet Count: 218 10*3/uL (ref 150–400)
RBC: 4.16 MIL/uL (ref 3.87–5.11)
RDW: 13.8 % (ref 11.5–15.5)
WBC Count: 5 10*3/uL (ref 4.0–10.5)
nRBC: 0 % (ref 0.0–0.2)

## 2024-02-24 LAB — CMP (CANCER CENTER ONLY)
ALT: 9 U/L (ref 0–44)
AST: 16 U/L (ref 15–41)
Albumin: 3.9 g/dL (ref 3.5–5.0)
Alkaline Phosphatase: 68 U/L (ref 38–126)
Anion gap: 8 (ref 5–15)
BUN: 19 mg/dL (ref 8–23)
CO2: 25 mmol/L (ref 22–32)
Calcium: 10.3 mg/dL (ref 8.9–10.3)
Chloride: 107 mmol/L (ref 98–111)
Creatinine: 1.22 mg/dL — ABNORMAL HIGH (ref 0.44–1.00)
GFR, Estimated: 44 mL/min — ABNORMAL LOW (ref >60)
Glucose, Bld: 210 mg/dL — ABNORMAL HIGH (ref 70–99)
Potassium: 3.9 mmol/L (ref 3.5–5.1)
Sodium: 140 mmol/L (ref 135–145)
Total Bilirubin: 0.8 mg/dL (ref 0.0–1.2)
Total Protein: 6.5 g/dL (ref 6.5–8.1)

## 2024-02-24 NOTE — Progress Notes (Signed)
 Hematology and Oncology Follow Up Visit  Christina [name] [id number] 1941/07/28 83 y.o. 02/24/2024   Principle Diagnosis:  Stage IIIB (T1N3M0) invasive urothelial carcinoma of the bladder  Subsegmental blood clot in right lower lung  Current Therapy:   Status post neoadjuvant chemotherapy with ddMVAC Radical cystectomy on 03/03/2020  Xarelto  20 mg p.o. daily-6 months of therapy to start on 06/30/2021 -  changed to Xarelto  10 mg po q day on 12/26/2021 -DC on 12/30/2023     Interim History:  Christina [name] is back for follow-up.  Her problem is that she is tired all the time.  I am not sure how to really explain this.  We checked your thyroid  back in May.  The TSH was 2.2.  I am not sure exactly why she is still feeling tired.  I know going to bed to 1 in the morning does not help.  She states she has a hard time sleeping.  She does take Ativan  on occasion to help with rest.  So far, there have been no evidence of any cancer coming back.  She has been thoroughly scanned recently..  She has had no problems with the urostomy.  The urine has not been cloudy or bloody.  She has had no diarrhea.  She has had no cough.  There has been no leg swelling.  She has had no bleeding.  There has been no rashes.     Overall, I would have to say that her performance status is probably ECOG 1.     Medications:  Current Outpatient Medications:    LORazepam  (ATIVAN ) 0.5 MG tablet, Take 1 tablet (0.5 mg total) by mouth 2 (two) times daily as needed for anxiety., Disp: 60 tablet, Rfl: 5   magnesium (MAGTAB) 84 MG ( ) TBCR SR tablet, Take 84 mg by mouth daily., Disp: , Rfl:    PARoxetine  (PAXIL ) 20 MG tablet, Take 1 tablet (20 mg total) by mouth daily., Disp: 90 tablet, Rfl: 1   VITAMIN D  PO, Take 1,000 Int'l Units/1.7m2 by mouth., Disp: , Rfl:   Allergies:  Allergies  Allergen Reactions   Nutrasweet Aspartame [Aspartame] Diarrhea   Lisinopril Cough    Past Medical History, Surgical history, Social  history, and Family History were reviewed and updated.  Review of Systems: Review of Systems  Constitutional: Negative.   HENT:  Negative.    Eyes: Negative.   Respiratory: Negative.    Cardiovascular: Negative.   Gastrointestinal: Negative.   Endocrine: Negative.   Genitourinary: Negative.    Musculoskeletal: Negative.   Skin: Negative.   Neurological: Negative.   Hematological: Negative.   Psychiatric/Behavioral: Negative.      Physical Exam:  height is 5' 5 (1.651 m) and weight is 162 lb (73.5 kg). Her oral temperature is 97.8 F (36.6 C). Her blood pressure is 107/81 and her pulse is 75. Her respiration is 18 and oxygen saturation is 98%.   Wt Readings from Last 3 Encounters:  02/24/24 162 lb (73.5 kg)  12/30/23 166 lb (75.3 kg)  11/11/23 161 lb (73 kg)    Physical Exam Vitals reviewed.  HENT:     Head: Normocephalic and atraumatic.   Eyes:     Pupils: Pupils are equal, round, and reactive to light.    Cardiovascular:     Rate and Rhythm: Normal rate and regular rhythm.     Heart sounds: Normal heart sounds.  Pulmonary:     Effort: Pulmonary effort is normal.     Breath sounds:  Normal breath sounds.  Abdominal:     General: Bowel sounds are normal.     Palpations: Abdomen is soft.     Comments: Abdominal exam shows the healed laparotomy scar in the midline.  She has some firmness in the middle of the laparotomy scar.  She has the urostomy bag which is well-positioned.  Urine is clear.  There is no fluid wave in the abdomen.  Bowel sounds are present.  She has no palpable liver or spleen tip.   Musculoskeletal:        General: No tenderness or deformity. Normal range of motion.     Cervical back: Normal range of motion.  Lymphadenopathy:     Cervical: No cervical adenopathy.   Skin:    General: Skin is warm and dry.     Findings: No erythema or rash.   Neurological:     Mental Status: She is alert and oriented to person, place, and time.    Psychiatric:        Behavior: Behavior normal.        Thought Content: Thought content normal.        Judgment: Judgment normal.     Lab Results  Component Value Date   WBC 5.6 12/30/2023   HGB 13.0 12/30/2023   HCT 39.9 12/30/2023   MCV 99.0 12/30/2023   PLT 228 12/30/2023     Chemistry      Component Value Date/Time   NA 139 12/30/2023 1203   NA 142 03/04/2017 1016   K 3.6 12/30/2023 1203   CL 106 12/30/2023 1203   CO2 27 12/30/2023 1203   BUN 18 12/30/2023 1203   BUN 13 03/04/2017 1016   CREATININE 1.16 (H) 12/30/2023 1203   CREATININE 0.78 05/20/2015 1704      Component Value Date/Time   CALCIUM 10.0 12/30/2023 1203   ALKPHOS 62 12/30/2023 1203   AST 15 12/30/2023 1203   ALT 6 12/30/2023 1203   BILITOT 0.7 12/30/2023 1203      Impression and Plan:  Christina [name] is a 83 year old white female.  She had superficial bladder cancer.  She was treated with intravesicular therapy.  However, she has had had muscle invasive cancer which was poorly differentiated.  She then underwent neoadjuvant chemotherapy followed by radical cystectomy.  She was found to have stage IIIB disease.  Again, there is been no evidence of recurrent urothelial cancer.  I do not think we have to do any scans on her since she was scanned fairly thoroughly recently.  I know she has other problems with her joints.  I know she sees Orthopedic Surgery for injections.  Again I do not have any real good idea as to why she does feel tired.  Of course, she does not see her family doctor.  I encouraged her to see her family doctor.  We will plan to get her back to see us  in another couple months.   Maude JONELLE Crease, MD 6/30/20251:43 PM

## 2024-02-25 ENCOUNTER — Telehealth: Payer: Self-pay

## 2024-02-25 NOTE — Telephone Encounter (Signed)
 Patient called the front desk to inquire about her referral to ortho.  I called the ortho office and I was told that they left a message on the patient's voicemail on June 9th. I called the patient and informed her. I provided her with the ortho clinic phone number and advised her to call and schedule an appointment with them. Patient verbalized understanding.

## 2024-02-27 ENCOUNTER — Encounter: Payer: Self-pay | Admitting: Family Medicine

## 2024-02-27 ENCOUNTER — Ambulatory Visit: Admitting: Family Medicine

## 2024-02-27 VITALS — BP 116/80 | HR 74 | Temp 98.0°F | Wt 158.8 lb

## 2024-02-27 DIAGNOSIS — R400 Somnolence: Secondary | ICD-10-CM | POA: Diagnosis not present

## 2024-02-27 DIAGNOSIS — R0683 Snoring: Secondary | ICD-10-CM | POA: Diagnosis not present

## 2024-02-27 DIAGNOSIS — R7309 Other abnormal glucose: Secondary | ICD-10-CM

## 2024-02-27 DIAGNOSIS — E538 Deficiency of other specified B group vitamins: Secondary | ICD-10-CM | POA: Diagnosis not present

## 2024-02-27 DIAGNOSIS — M5416 Radiculopathy, lumbar region: Secondary | ICD-10-CM | POA: Diagnosis not present

## 2024-02-27 HISTORY — DX: Somnolence: R40.0

## 2024-02-27 LAB — POCT GLYCOSYLATED HEMOGLOBIN (HGB A1C)
HbA1c POC (<> result, manual entry): 5.6 % (ref 4.0–5.6)
HbA1c, POC (controlled diabetic range): 5.6 % (ref 0.0–7.0)
HbA1c, POC (prediabetic range): 5.6 % — AB (ref 5.7–6.4)
Hemoglobin A1C: 5.6 % (ref 4.0–5.6)

## 2024-02-27 MED ORDER — CYANOCOBALAMIN 1000 MCG/ML IJ SOLN
1000.0000 ug | Freq: Once | INTRAMUSCULAR | Status: AC
  Administered 2024-02-27: 1000 ug via INTRAMUSCULAR

## 2024-02-27 NOTE — Patient Instructions (Addendum)
 Return if symptoms worsen or fail to improve.  B12 1000-1500 mcg daily. Buy the solution that  you place under your tongue for 30 seconds and then swallow. It is the best absorbed.       Great to see you today.  I have refilled the medication(s) we provide.   If labs were collected or images ordered, we will inform you of  results once we have received them and reviewed. We will contact you either by echart message, or telephone call.  Please give ample time to the testing facility, and our office to run,  receive and review results. Please do not call inquiring of results, even if you can see them in your chart. We will contact you as soon as we are able. If it has been over 1 week since the test was completed, and you have not yet heard from us , then please call us .    - echart message- for normal results that have been seen by the patient already.   - telephone call: abnormal results or if patient has not viewed results in their echart.  If a referral to a specialist was entered for you, please call us  in 2 weeks if you have not heard from the specialist office to schedule.

## 2024-02-27 NOTE — Progress Notes (Signed)
 Christina [name] , 08-May-1941, 83 y.o., female MRN: [id number] Patient Care Team    Relationship Specialty Notifications Start End  Catherine Charlies LABOR, DO PCP - General Family Medicine  09/02/17   Ebbie Cough, MD Consulting Physician General Surgery  10/05/16   Cleatus Collar, MD Consulting Physician Ophthalmology  10/05/16   Vicenta Sewer, DDS Consulting Physician Dentistry  10/05/16   Kristie Lamprey, MD Consulting Physician Gastroenterology  09/02/17   Timmy Maude JONELLE, MD Medical Oncologist Oncology  05/14/19   Bonner Ade, MD Consulting Physician Physical Medicine and Rehabilitation  01/12/21   Claudene Arthea HERO, DO Consulting Physician Sports Medicine  01/12/21   Elisabeth Valli BIRCH, MD Consulting Physician Urology  10/30/22     Chief Complaint  Patient presents with   Fatigue    Ongoing for a few years; constant fatigue. Pt feeling like she can never get enough sleep.      Subjective: Christina [name] is a 83 y.o. Pt presents for an OV with complaints of excessive sleepiness of 2 years duration.  Associated symptoms include wakes up feeling tired. Pt reports she falls asleep about 10pm and will sleep until 12 pm the next morning sometimes. She does snore. She has wakened herself snoring. Otherwise she reports she only wakes once at 7:20 am, but goes back to sleep.  Glucose 210 02/24/2024> had 2 bagels with jelly and coffee one hour prior to collection. Cbc. Cmp, tsh, vit d all normal recently.  She denies chest pain or shortness of breath.      02/24/2024    1:39 PM 01/15/2024    2:00 PM 11/01/2023   11:26 AM 04/23/2023   11:03 AM 01/09/2023    1:24 PM  Depression screen PHQ 2/9  Decreased Interest 0 0 1 2 0  Down, Depressed, Hopeless 0 1 1 1  0  PHQ - 2 Score 0 1 2 3  0  Altered sleeping  3 3 3    Tired, decreased energy  2 3 3    Change in appetite  0 0 0   Feeling bad or failure about yourself   0 0 1   Trouble concentrating  0 1 3   Moving slowly or fidgety/restless  0 1 2    Suicidal thoughts  0 0 0   PHQ-9 Score  6 10 15    Difficult doing work/chores  Not difficult at all Somewhat difficult Somewhat difficult     Allergies  Allergen Reactions   Nutrasweet Aspartame [Aspartame] Diarrhea   Lisinopril Cough   Social History   Social History Narrative   Retired. Lives alone.    Attended some business college.    Former smoker.    Smoke alarm in the home. Wears seat balt.    Wears dentures.    Feels safe in her relationships.       Past Medical History:  Diagnosis Date   Anal fissure 10/24/2021   Arthritis    Asthma    a little?, no problems in several years   Bladder cancer [facility])    Bladder tumor    Cervical cancer screening 01/31/2012   Chicken pox as a child   Chronic idiopathic constipation 10/24/2021   Degenerative tear of acetabular labrum of left hip 02/26/2019   Dehydration 01/19/2020   Depression with anxiety 06/07/2009   Qualifier: Diagnosis of  By: Alvan MD, Catherine     Dermatitis of external ear 07/11/2012   Diverticulosis    Essential hypertension, benign 10/13/2007  Qualifier: Diagnosis of  By: Alvan MD, Catherine     Ganglion cyst 02/26/2019   Right elbow   Hiatal hernia with gastroesophageal reflux 10/26/2010   Qualifier: Diagnosis of  By: Alvan MD, Catherine     Hyperglycemia 06/21/2013   Hyperlipidemia, mixed 10/16/2015   pt unaware   Hypertension    Left hip pain 07/10/2015   Lesion of breast 12/03/2016   benign; resolved   Low back pain 05/29/2015   Measles as a child   Mumps as a child   Osteopenia 01/03/2017   Overweight (BMI 25.0-29.9) 10/07/2008   Qualifier: Diagnosis of  By: Alvan MD, Dorothyann     Palpitations    several years ago, not currently   Pre-diabetes    Psoriasis    ears   Rectal bleeding 10/24/2021   RUQ pain 05/29/2015   Spider veins    Bilateral legs   Spinal stenosis    Tailor's bunionette, left 09/02/2017   Umbilical hernia    Urinary frequency 09/28/2011    Vertigo    Wears dentures    Upper,   Past Surgical History:  Procedure Laterality Date   ABDOMINAL SURGERY  1970's   BREAST BIOPSY Right 1980   BREAST EXCISIONAL BIOPSY   BREAST BIOPSY Left 1970   BREAST EXCISIONAL BIOPSY   BREAST CYST ASPIRATION     CATARACT EXTRACTION, BILATERAL     COLONOSCOPY     CYSTECTOMY     abdomen   CYSTOSCOPY W/ RETROGRADES Bilateral 04/27/2019   Procedure: CYSTOSCOPY WITH RETROGRADE PYELOGRAM;  Surgeon: Sherrilee Belvie CROME, MD;  Location: [facility];  Service: Urology;  Laterality: Bilateral;   EYE SURGERY Bilateral    cataract   INSERTION OF MESH N/A 06/11/2017   Procedure: INSERTION OF MESH;  Surgeon: Ebbie Cough, MD;  Location: [facility] OR;  Service: General;  Laterality: N/A;  BILATERAL TAP BLOCK   IR REMOVAL TUN ACCESS W/ PORT W/O FL MOD SED  11/09/2020   TRANSURETHRAL RESECTION OF BLADDER TUMOR N/A 04/27/2019   Procedure: TRANSURETHRAL RESECTION OF BLADDER TUMOR (TURBT);  Surgeon: Sherrilee Belvie CROME, MD;  Location: [facility];  Service: Urology;  Laterality: N/A;  1 HR   TRANSURETHRAL RESECTION OF BLADDER TUMOR N/A 06/01/2019   Procedure: TRANSURETHRAL RESECTION OF BLADDER TUMOR (TURBT);  Surgeon: Sherrilee Belvie CROME, MD;  Location[facility];  Service: Urology;  Laterality: N/A;  1 HR   TRANSURETHRAL RESECTION OF BLADDER TUMOR N/A 10/22/2019   Procedure: TRANSURETHRAL RESECTION OF BLADDER TUMOR (TURBT);  Surgeon: Sherrilee Belvie CROME, MD;  Location[facility];  Service: Urology;  Laterality: N/A;   TUBAL LIGATION  1970   VENTRAL HERNIA REPAIR N/A 06/11/2017   Procedure: LAPAROSCOPIC VENTRAL HERNIA REPAIR WITH MESH ERAS PATHWAY;  Surgeon: Ebbie Cough, MD;  Location: [facility] OR;  Service: General;  Laterality: N/A;  BILATERAL TAP BLOCK   Family History  Problem Relation Age of Onset   Hypertension Mother    Anxiety disorder Mother    Ovarian cancer Mother 55       lung- smoker,  ovarian   Lung cancer Mother        smoker   Arthritis Mother    Hyperlipidemia Mother    Alcohol abuse Father    Diabetes Sister    Hypertension Sister    Ovarian cancer Sister    Arthritis Sister    Depression Sister    Hyperlipidemia Sister    Asthma Sister    Depression  Sister    Hyperlipidemia Sister    Breast cancer Sister    Asthma Sister    Depression Sister    Arthritis Brother    COPD Brother    Heart attack Brother    Hyperlipidemia Brother    Stroke Brother    Prostate cancer Brother    Other Maternal Grandmother        pacemaker   Multiple sclerosis Maternal Grandfather        ?   Asthma Paternal Uncle    Allergies as of 02/27/2024       Reactions   Nutrasweet Aspartame [aspartame] Diarrhea   Lisinopril Cough        Medication List        Accurate as of February 27, 2024 11:21 AM. If you have any questions, ask your nurse or doctor.          LORazepam  0.5 MG tablet Commonly known as: ATIVAN  Take 1 tablet (0.5 mg total) by mouth 2 (two) times daily as needed for anxiety.   magnesium 84 MG ( ) Tbcr SR tablet Commonly known as: MAGTAB Take 84 mg by mouth daily.   PARoxetine  20 MG tablet Commonly known as: PAXIL  Take 1 tablet (20 mg total) by mouth daily.   VITAMIN D  PO Take 1,000 Int'l Units/1.7m2 by mouth.        All past medical history, surgical history, allergies, family history, immunizations andmedications were updated in the EMR today and reviewed under the history and medication portions of their EMR.     ROS Negative, with the exception of above mentioned in HPI   Objective:  BP 116/80   Pulse 74   Temp 98 F (36.7 C)   Wt 158 lb 12.8 oz (72 kg)   SpO2 97%   BMI 26.43 kg/m  Body mass index is 26.43 kg/m. Physical Exam Vitals and nursing note reviewed.  Constitutional:      General: She is not in acute distress.    Appearance: Normal appearance. She is not ill-appearing, toxic-appearing or diaphoretic.  HENT:      Head: Normocephalic and atraumatic.  Eyes:     General: No scleral icterus.       Right eye: No discharge.        Left eye: No discharge.     Extraocular Movements: Extraocular movements intact.     Conjunctiva/sclera: Conjunctivae normal.     Pupils: Pupils are equal, round, and reactive to light.  Cardiovascular:     Rate and Rhythm: Normal rate and regular rhythm.     Heart sounds: No murmur heard. Pulmonary:     Effort: Pulmonary effort is normal. No respiratory distress.     Breath sounds: Normal breath sounds. No wheezing, rhonchi or rales.  Musculoskeletal:     Right lower leg: No edema.     Left lower leg: No edema.  Skin:    General: Skin is warm.     Findings: No rash.  Neurological:     Mental Status: She is alert and oriented to person, place, and time. Mental status is at baseline.     Motor: No weakness.     Gait: Gait normal.  Psychiatric:        Mood and Affect: Mood normal.        Behavior: Behavior normal.        Thought Content: Thought content normal.        Judgment: Judgment normal.      No results found.  No results found. Results for orders placed or performed in visit on 02/27/24 (from the past 24 hours)  POCT HgB A1C     Status: Abnormal   Collection Time: 02/27/24 10:32 AM  Result Value Ref Range   Hemoglobin A1C 5.6 4.0 - 5.6 %   HbA1c POC (<> result, manual entry) 5.6 4.0 - 5.6 %   HbA1c, POC (prediabetic range) 5.6 (A) 5.7 - 6.4 %   HbA1c, POC (controlled diabetic range) 5.6 0.0 - 7.0 %    Assessment/Plan: Christina [name] is a 83 y.o. female present for OV for  Elevated glucose (210) - POCT HgB A1C> 5.6 today. Not a diabetic. High glucose likely from her eating a high carb and sugary meal 1 hour prior to collection of lab.   Daytime somnolence (Primary)/Snores/fatigue Suspect sleep d/o vs sleep apnea as potential cause of fatigue and somnolence - Ambulatory referral to Pulmonology - recent cbc, cmp, tsh and vit d  all normal.    B12 deficiency B12 injection provided today and pt instructed on starting sublingual b12 daily to maintain levels.  Could be contributing to fatigue  Reviewed expectations re: course of current medical issues. Discussed self-management of symptoms. Outlined signs and symptoms indicating need for more acute intervention. Patient verbalized understanding and all questions were answered. Patient received an After-Visit Summary.    Orders Placed This Encounter  Procedures   Ambulatory referral to Pulmonology   POCT HgB A1C   Meds ordered this encounter  Medications   cyanocobalamin  (VITAMIN B12) injection 1,000 mcg   Referral Orders         Ambulatory referral to Pulmonology       Note is dictated utilizing voice recognition software. Although note has been proof read prior to signing, occasional typographical errors still can be missed. If any questions arise, please do not hesitate to call for verification.   electronically signed by:  Charlies Bellini, DO  [facility] - OR

## 2024-03-27 DIAGNOSIS — C7911 Secondary malignant neoplasm of bladder: Secondary | ICD-10-CM | POA: Diagnosis not present

## 2024-03-27 DIAGNOSIS — Z936 Other artificial openings of urinary tract status: Secondary | ICD-10-CM | POA: Diagnosis not present

## 2024-03-27 DIAGNOSIS — K9409 Other complications of colostomy: Secondary | ICD-10-CM | POA: Diagnosis not present

## 2024-04-02 DIAGNOSIS — M5416 Radiculopathy, lumbar region: Secondary | ICD-10-CM | POA: Diagnosis not present

## 2024-04-02 DIAGNOSIS — M545 Low back pain, unspecified: Secondary | ICD-10-CM | POA: Diagnosis not present

## 2024-04-02 DIAGNOSIS — G8929 Other chronic pain: Secondary | ICD-10-CM | POA: Diagnosis not present

## 2024-04-03 ENCOUNTER — Telehealth: Payer: Self-pay

## 2024-04-03 NOTE — Telephone Encounter (Signed)
 Copied from CRM [id number]. Topic: Clinical - Medication Question >> Apr 03, 2024  1:12 PM Rozanna G wrote: Reason for CRM: PT CALLED STATED SHE WAS RETURNING [facility];S CALL I DID NOT SEE A MESSAGE FROM [facility] IN HER CHART. SHE IS WANTING A CALL BACK FROM THE CLINIC.  I do not see any documentation in chart. This may be regarding an appt for referral.  Routing to [place] to see if patient was contacted.

## 2024-04-06 NOTE — Telephone Encounter (Signed)
 Attempted to call patient back regarding her returned call, but no answer and left a VM.   All documentation regarding patient outreach is/was completed within the communications of the patient's referral. Patient is in need of scheduling from referral that we received. Please see all scheduling/referral notes in the referral details.   Nothing further needed at this time.

## 2024-04-07 ENCOUNTER — Encounter: Payer: Self-pay | Admitting: Hematology & Oncology

## 2024-04-09 ENCOUNTER — Ambulatory Visit: Payer: Self-pay

## 2024-04-09 NOTE — Telephone Encounter (Signed)
 Pt declined scheduling

## 2024-04-09 NOTE — Telephone Encounter (Signed)
 FYI Only or Action Required?: Action required by provider: clinical question for provider.  Patient was last seen in primary care on 02/27/2024 by [name], DO.  Called Nurse Triage reporting yellow Jacket sting.  Symptoms began yesterday.  Interventions attempted: OTC medications: Benadryl and Rest, hydration, or home remedies.  Symptoms are: unchanged.  Triage Disposition: See Physician Within 24 Hours-asking for a phone call back today due to going out of town tomorrow.   Patient/caregiver understands and will follow disposition?: No, wishes to speak with PCP  Copied from CRM #[id number]. Topic: Clinical - Red Word Triage >> Apr 09, 2024 11:40 AM Robinson DEL wrote: [facility] that prompted transfer to Nurse Triage: Stung by 10-11 yellow jackets yesterday and really hurts not as bad, some swelling in a finger or two Reason for Disposition  [1] Red or very tender (to touch) area AND [2] started over 24 hours after the sting  Answer Assessment - Initial Assessment Questions 1. TYPE: What type of sting was it? (e.g., bee, yellow jacket, unknown)      Yellow jackets 2. ONSET: When did it occur?      Yesterday afternoon 3. LOCATION: Where is the sting located?  How many stings?     All over body-patient states she was stung 10-11 times 4. SWELLING SIZE: How big is the swelling? (e.g., inches or cm)     Swelling to her finger. 5. REDNESS: Is the area red or pink? If Yes, ask: What size is area of redness? (e.g., inches or cm). When did the redness start?     Redness-shortly after being stung 6. PAIN: Is there any pain? If Yes, ask: How bad is it?  (Scale 0-10; or none, mild, moderate, severe)     7-8 out of 10 7. ITCHING: Is there any itching? If Yes, ask: How bad is it?      Yes-mild 8. RESPIRATORY DISTRESS: Describe your breathing.     No concerns with respiratory system 9. PRIOR REACTIONS: Have you had any severe allergic reactions to stings in the  past? If Yes, ask: What happened?     No severe allergic reaction 10. OTHER SYMPTOMS: Do you have any other symptoms? (e.g., abdomen pain, face or tongue swelling, new rash elsewhere, vomiting)       No  Patient calling with the main objective of getting recommendations for pain control. Patient states she is having a good amount of pain from the yellow jacket stings. Discussed with patient being seen in the office tomorrow but patient reports she is going out of town. Asking for a follow up call today.  Protocols used: Bee or Yellow Jacket Sting-A-AH

## 2024-05-01 ENCOUNTER — Encounter: Payer: Self-pay | Admitting: Family

## 2024-05-01 ENCOUNTER — Inpatient Hospital Stay: Admitting: Family

## 2024-05-01 ENCOUNTER — Inpatient Hospital Stay: Attending: Hematology & Oncology

## 2024-05-01 VITALS — BP 123/73 | HR 66 | Temp 97.9°F | Resp 17 | Wt 154.1 lb

## 2024-05-01 DIAGNOSIS — C679 Malignant neoplasm of bladder, unspecified: Secondary | ICD-10-CM | POA: Diagnosis not present

## 2024-05-01 DIAGNOSIS — Z8551 Personal history of malignant neoplasm of bladder: Secondary | ICD-10-CM

## 2024-05-01 DIAGNOSIS — E559 Vitamin D deficiency, unspecified: Secondary | ICD-10-CM

## 2024-05-01 DIAGNOSIS — I2693 Single subsegmental pulmonary embolism without acute cor pulmonale: Secondary | ICD-10-CM | POA: Diagnosis not present

## 2024-05-01 DIAGNOSIS — G43909 Migraine, unspecified, not intractable, without status migrainosus: Secondary | ICD-10-CM | POA: Diagnosis not present

## 2024-05-01 DIAGNOSIS — R5383 Other fatigue: Secondary | ICD-10-CM | POA: Insufficient documentation

## 2024-05-01 DIAGNOSIS — G629 Polyneuropathy, unspecified: Secondary | ICD-10-CM | POA: Insufficient documentation

## 2024-05-01 DIAGNOSIS — Z7901 Long term (current) use of anticoagulants: Secondary | ICD-10-CM | POA: Diagnosis not present

## 2024-05-01 DIAGNOSIS — Z86711 Personal history of pulmonary embolism: Secondary | ICD-10-CM | POA: Diagnosis not present

## 2024-05-01 DIAGNOSIS — T451X5A Adverse effect of antineoplastic and immunosuppressive drugs, initial encounter: Secondary | ICD-10-CM

## 2024-05-01 LAB — CBC WITH DIFFERENTIAL (CANCER CENTER ONLY)
Abs Immature Granulocytes: 0.01 K/uL (ref 0.00–0.07)
Basophils Absolute: 0 K/uL (ref 0.0–0.1)
Basophils Relative: 0 %
Eosinophils Absolute: 0.1 K/uL (ref 0.0–0.5)
Eosinophils Relative: 1 %
HCT: 40.3 % (ref 36.0–46.0)
Hemoglobin: 13.3 g/dL (ref 12.0–15.0)
Immature Granulocytes: 0 %
Lymphocytes Relative: 32 %
Lymphs Abs: 1.8 K/uL (ref 0.7–4.0)
MCH: 32 pg (ref 26.0–34.0)
MCHC: 33 g/dL (ref 30.0–36.0)
MCV: 97.1 fL (ref 80.0–100.0)
Monocytes Absolute: 0.5 K/uL (ref 0.1–1.0)
Monocytes Relative: 9 %
Neutro Abs: 3.2 K/uL (ref 1.7–7.7)
Neutrophils Relative %: 58 %
Platelet Count: 228 K/uL (ref 150–400)
RBC: 4.15 MIL/uL (ref 3.87–5.11)
RDW: 13.8 % (ref 11.5–15.5)
WBC Count: 5.6 K/uL (ref 4.0–10.5)
nRBC: 0 % (ref 0.0–0.2)

## 2024-05-01 LAB — IRON AND IRON BINDING CAPACITY (CC-WL,HP ONLY)
Iron: 121 ug/dL (ref 28–170)
Saturation Ratios: 40 % — ABNORMAL HIGH (ref 10.4–31.8)
TIBC: 304 ug/dL (ref 250–450)
UIBC: 183 ug/dL

## 2024-05-01 LAB — LACTATE DEHYDROGENASE: LDH: 216 U/L — ABNORMAL HIGH (ref 98–192)

## 2024-05-01 LAB — MAGNESIUM: Magnesium: 1.7 mg/dL (ref 1.7–2.4)

## 2024-05-01 LAB — CMP (CANCER CENTER ONLY)
ALT: 12 U/L (ref 0–44)
AST: 27 U/L (ref 15–41)
Albumin: 4.2 g/dL (ref 3.5–5.0)
Alkaline Phosphatase: 81 U/L (ref 38–126)
Anion gap: 11 (ref 5–15)
BUN: 17 mg/dL (ref 8–23)
CO2: 25 mmol/L (ref 22–32)
Calcium: 10.4 mg/dL — ABNORMAL HIGH (ref 8.9–10.3)
Chloride: 104 mmol/L (ref 98–111)
Creatinine: 1.14 mg/dL — ABNORMAL HIGH (ref 0.44–1.00)
GFR, Estimated: 48 mL/min — ABNORMAL LOW (ref >60)
Glucose, Bld: 118 mg/dL — ABNORMAL HIGH (ref 70–99)
Potassium: 4.6 mmol/L (ref 3.5–5.1)
Sodium: 140 mmol/L (ref 135–145)
Total Bilirubin: 1.1 mg/dL (ref 0.0–1.2)
Total Protein: 6.7 g/dL (ref 6.5–8.1)

## 2024-05-01 LAB — FERRITIN: Ferritin: 97 ng/mL (ref 11–307)

## 2024-05-03 ENCOUNTER — Encounter: Payer: Self-pay | Admitting: Hematology & Oncology

## 2024-05-03 NOTE — Progress Notes (Signed)
 Hematology and Oncology Follow Up Visit  Christina [name] [id number] 1941/01/01 83 y.o. 05/03/2024   Principle Diagnosis:  Stage IIIB (T1N3M0) invasive urothelial carcinoma of the bladder  Subsegmental blood clot in right lower lung   Current Therapy:        Status post neoadjuvant chemotherapy with ddMVAC Radical cystectomy on 03/03/2020  Xarelto  20 mg p.o. daily-6 months of therapy to start on 06/30/2021 -  changed to Xarelto  10 mg po q day on 12/26/2021 -DC on 12/30/2023   Interim History:  Christina [name] is here today for follow-up. She continues to do well.  She notes some fatigue at times as well as migraines.  No fever, chills, n/v, cough, rash, dizziness, SOB, chest pain, palpitations, abdominal pain or changes in bowel or bladder habits.  Urostomy continues to function nicely.  She has not noted any blood loss. No abnormal bruising, no petechiae.  No swelling noted in her extremities.  Neuropathy in her fingers and toes unchanged from baseline.  No syncope reported. No falls.  Appetite and hydration are good. Weight is stable at 154 lbs.   ECOG Performance Status: 1 - Symptomatic but completely ambulatory  Medications:  Allergies as of 05/01/2024       Reactions   Nutrasweet Aspartame [aspartame] Diarrhea   Lisinopril Cough        Medication List        Accurate as of May 01, 2024 11:59 PM. If you have any questions, ask your nurse or doctor.          LORazepam  0.5 MG tablet Commonly known as: ATIVAN  Take 1 tablet (0.5 mg total) by mouth 2 (two) times daily as needed for anxiety.   magnesium 84 MG ( ) Tbcr SR tablet Commonly known as: MAGTAB Take 84 mg by mouth daily.   PARoxetine  20 MG tablet Commonly known as: PAXIL  Take 1 tablet (20 mg total) by mouth daily.   VITAMIN D  PO Take 1,000 Int'l Units/1.7m2 by mouth.        Allergies:  Allergies  Allergen Reactions   Nutrasweet Aspartame [Aspartame] Diarrhea   Lisinopril Cough    Past  Medical History, Surgical history, Social history, and Family History were reviewed and updated.  Review of Systems: All other 10 point review of systems is negative.   Physical Exam:  weight is 154 lb 1.9 oz (69.9 kg). Her oral temperature is 97.9 F (36.6 C). Her blood pressure is 123/73 and her pulse is 66. Her respiration is 17 and oxygen saturation is 100%.   Wt Readings from Last 3 Encounters:  05/01/24 154 lb 1.9 oz (69.9 kg)  02/27/24 158 lb 12.8 oz (72 kg)  02/24/24 162 lb (73.5 kg)    Ocular: Sclerae unicteric, pupils equal, round and reactive to light Ear-nose-throat: Oropharynx clear, dentition fair Lymphatic: No cervical or supraclavicular adenopathy Lungs no rales or rhonchi, good excursion bilaterally Heart regular rate and rhythm, no murmur appreciated Abd soft, nontender, urostomy intact, positive bowel sounds MSK no focal spinal tenderness, no joint edema Neuro: non-focal, well-oriented, appropriate affect Breasts: Deferred   Lab Results  Component Value Date   WBC 5.6 05/01/2024   HGB 13.3 05/01/2024   HCT 40.3 05/01/2024   MCV 97.1 05/01/2024   PLT 228 05/01/2024   Lab Results  Component Value Date   FERRITIN 97 05/01/2024   IRON 121 05/01/2024   TIBC 304 05/01/2024   UIBC 183 05/01/2024   IRONPCTSAT 40 (H) 05/01/2024   Lab Results  Component  Value Date   RETICCTPCT 1.9 10/03/2022   RBC 4.15 05/01/2024   No results found for: KPAFRELGTCHN, LAMBDASER, KAPLAMBRATIO No results found for: IGGSERUM, IGA, IGMSERUM No results found for: STEPHANY CARLOTA BENSON MARKEL EARLA JOANNIE DOC VICK, SPEI   Chemistry      Component Value Date/Time   NA 140 05/01/2024 1503   NA 142 03/04/2017 1016   K 4.6 05/01/2024 1503   CL 104 05/01/2024 1503   CO2 25 05/01/2024 1503   BUN 17 05/01/2024 1503   BUN 13 03/04/2017 1016   CREATININE 1.14 (H) 05/01/2024 1503   CREATININE 0.78 05/20/2015 1704      Component Value  Date/Time   CALCIUM 10.4 (H) 05/01/2024 1503   ALKPHOS 81 05/01/2024 1503   AST 27 05/01/2024 1503   ALT 12 05/01/2024 1503   BILITOT 1.1 05/01/2024 1503       Impression and Plan: Christina [name] is a very pleasant 83 yo caucasian female with history of superficial bladder cancer.  She was treated with intravesicular therapy but was found to have muscle invasive cancer which was poorly differentiated.  She then underwent neoadjuvent chemotherapy followed by radical cystectomy. She was a stage IIIB disease.  With history of PE she completed 2 years of Xarelto .  So far there has been no evidence of recurrent disease or thrombus.  No intervention indicated at this time.  Follow-up in 4 months.   Lauraine Pepper, NP 9/7/202512:31 PM

## 2024-05-05 DIAGNOSIS — Z936 Other artificial openings of urinary tract status: Secondary | ICD-10-CM | POA: Diagnosis not present

## 2024-05-05 DIAGNOSIS — C7911 Secondary malignant neoplasm of bladder: Secondary | ICD-10-CM | POA: Diagnosis not present

## 2024-05-05 DIAGNOSIS — K9409 Other complications of colostomy: Secondary | ICD-10-CM | POA: Diagnosis not present

## 2024-05-13 DIAGNOSIS — N3289 Other specified disorders of bladder: Secondary | ICD-10-CM | POA: Diagnosis not present

## 2024-05-13 DIAGNOSIS — Z8551 Personal history of malignant neoplasm of bladder: Secondary | ICD-10-CM | POA: Diagnosis not present

## 2024-05-13 DIAGNOSIS — R8289 Other abnormal findings on cytological and histological examination of urine: Secondary | ICD-10-CM | POA: Diagnosis not present

## 2024-06-01 ENCOUNTER — Ambulatory Visit: Admitting: Primary Care

## 2024-06-16 ENCOUNTER — Ambulatory Visit: Payer: Self-pay | Admitting: Family Medicine

## 2024-06-16 ENCOUNTER — Ambulatory Visit (HOSPITAL_BASED_OUTPATIENT_CLINIC_OR_DEPARTMENT_OTHER)
Admission: RE | Admit: 2024-06-16 | Discharge: 2024-06-16 | Disposition: A | Discharge status: Home / Self Care | Source: Ambulatory Visit | Attending: Family Medicine | Admitting: Family Medicine

## 2024-06-16 ENCOUNTER — Encounter: Payer: Self-pay | Admitting: Family Medicine

## 2024-06-16 ENCOUNTER — Ambulatory Visit: Admitting: Family Medicine

## 2024-06-16 VITALS — BP 120/74 | HR 82 | Temp 97.9°F | Wt 154.0 lb

## 2024-06-16 DIAGNOSIS — R053 Chronic cough: Secondary | ICD-10-CM | POA: Diagnosis not present

## 2024-06-16 DIAGNOSIS — Z8551 Personal history of malignant neoplasm of bladder: Secondary | ICD-10-CM

## 2024-06-16 DIAGNOSIS — J301 Allergic rhinitis due to pollen: Secondary | ICD-10-CM

## 2024-06-16 DIAGNOSIS — Z86711 Personal history of pulmonary embolism: Secondary | ICD-10-CM | POA: Insufficient documentation

## 2024-06-16 DIAGNOSIS — R7309 Other abnormal glucose: Secondary | ICD-10-CM

## 2024-06-16 DIAGNOSIS — M40204 Unspecified kyphosis, thoracic region: Secondary | ICD-10-CM | POA: Diagnosis not present

## 2024-06-16 DIAGNOSIS — R059 Cough, unspecified: Secondary | ICD-10-CM | POA: Diagnosis not present

## 2024-06-16 LAB — POCT GLYCOSYLATED HEMOGLOBIN (HGB A1C)
HbA1c POC (<> result, manual entry): 5.6 % (ref 4.0–5.6)
HbA1c, POC (controlled diabetic range): 5.6 % (ref 0.0–7.0)
HbA1c, POC (prediabetic range): 5.6 % — AB (ref 5.7–6.4)
Hemoglobin A1C: 5.6 % (ref 4.0–5.6)

## 2024-06-16 MED ORDER — FEXOFENADINE HCL 180 MG PO TABS: 180.0000 mg | ORAL_TABLET | Freq: Every day | ORAL | 1 refills | Status: AC

## 2024-06-16 MED ORDER — IPRATROPIUM BROMIDE 0.06 % NA SOLN: 2.0000 | Freq: Four times a day (QID) | NASAL | 12 refills | Status: AC

## 2024-06-16 NOTE — Progress Notes (Unsigned)
 Christina [name] , 10/19/1940, 83 y.o., female MRN: [id number] Patient Care Team    Relationship Specialty Notifications Start End  Catherine Charlies LABOR, DO PCP - General Family Medicine  09/02/17   Ebbie Cough, MD Consulting Physician General Surgery  10/05/16   Cleatus Collar, MD Consulting Physician Ophthalmology  10/05/16   Vicenta Sewer, DDS Consulting Physician Dentistry  10/05/16   Kristie Lamprey, MD Consulting Physician Gastroenterology  09/02/17   Timmy Maude JONELLE, MD Medical Oncologist Oncology  05/14/19   Bonner Ade, MD Consulting Physician Physical Medicine and Rehabilitation  01/12/21   Claudene Arthea HERO, DO Consulting Physician Sports Medicine  01/12/21   Elisabeth Valli BIRCH, MD Consulting Physician Urology  10/30/22     Chief Complaint  Patient presents with   Cough    Ongoing since summer; lingering cough. No other sx.     Subjective: Christina [name] is a 83 y.o. Pt presents for an OV with complaints of lingering cough since the spring.  She reports a hacking cough throughout all of the summer.  She noticed last week it started to improve and no longer a deep hacking cough but still having a mild cough.  She is not taking anything for her cough. She denies fever or chills.  She does endorse feeling a little more fatigued than usual.  She would like to have her A1c repeated today.  She has concerns over becoming a diabetic.  She has had some elevated glucose readings when not fasting, 1 of which was 210 in July.  She has had A1c's that have gone as high as 6.0 in the prediabetic range.  Last A1c was 5.6 in July.  Reviewed recent labs with oncology with elevated LDH and a mildly elevated calcium level of 10.4.     02/24/2024    1:39 PM 01/15/2024    2:00 PM 11/01/2023   11:26 AM 04/23/2023   11:03 AM 01/09/2023    1:24 PM  Depression screen PHQ 2/9  Decreased Interest 0 0 1 2 0  Down, Depressed, Hopeless 0 1 1 1  0  PHQ - 2 Score 0 1 2 3  0  Altered sleeping  3 3 3     Tired, decreased energy  2 3 3    Change in appetite  0 0 0   Feeling bad or failure about yourself   0 0 1   Trouble concentrating  0 1 3   Moving slowly or fidgety/restless  0 1 2   Suicidal thoughts  0 0 0   PHQ-9 Score  6 10 15    Difficult doing work/chores  Not difficult at all Somewhat difficult Somewhat difficult     Allergies  Allergen Reactions   Nutrasweet Aspartame [Aspartame] Diarrhea   Lisinopril Cough   Social History   Social History Narrative   Retired. Lives alone.    Attended some business college.    Former smoker.    Smoke alarm in the home. Wears seat balt.    Wears dentures.    Feels safe in her relationships.       Past Medical History:  Diagnosis Date   Anal fissure 10/24/2021   Arthritis    Asthma    a little?, no problems in several years   Bladder cancer (HCC)    Bladder tumor    Cervical cancer screening 01/31/2012   Chicken pox as a child   Chronic idiopathic constipation 10/24/2021   Daytime somnolence 02/27/2024   Degenerative  tear of acetabular labrum of left hip 02/26/2019   Dehydration 01/19/2020   Depression with anxiety 06/07/2009   Qualifier: Diagnosis of  By: Alvan MD, Catherine     Dermatitis of external ear 07/11/2012   Diverticulosis    Essential hypertension, benign 10/13/2007   Qualifier: Diagnosis of  By: Alvan MD, Catherine     Ganglion cyst 02/26/2019   Right elbow   Hiatal hernia with gastroesophageal reflux 10/26/2010   Qualifier: Diagnosis of  By: Alvan MD, Catherine     Hyperglycemia 06/21/2013   Hyperlipidemia, mixed 10/16/2015   pt unaware   Hypertension    Left hip pain 07/10/2015   Lesion of breast 12/03/2016   benign; resolved   Low back pain 05/29/2015   Measles as a child   Mumps as a child   Osteopenia 01/03/2017   Overweight (BMI 25.0-29.9) 10/07/2008   Qualifier: Diagnosis of  By: Alvan MD, Dorothyann     Palpitations    several years ago, not currently   Pre-diabetes    Psoriasis     ears   Rectal bleeding 10/24/2021   RUQ pain 05/29/2015   Spider veins    Bilateral legs   Spinal stenosis    Tailor's bunionette, left 09/02/2017   Umbilical hernia    Urinary frequency 09/28/2011   Vertigo    Wears dentures    Upper,   Xarelto  use-acquired thrombophilia (HCC) 04/16/2023   Past Surgical History:  Procedure Laterality Date   ABDOMINAL SURGERY  1970's   BREAST BIOPSY Right 1980   BREAST EXCISIONAL BIOPSY   BREAST BIOPSY Left 1970   BREAST EXCISIONAL BIOPSY   BREAST CYST ASPIRATION     CATARACT EXTRACTION, BILATERAL     COLONOSCOPY     CYSTECTOMY     abdomen   CYSTOSCOPY W/ RETROGRADES Bilateral 04/27/2019   Procedure: CYSTOSCOPY WITH RETROGRADE PYELOGRAM;  Surgeon: Sherrilee Belvie CROME, MD;  Location: [facility];  Service: Urology;  Laterality: Bilateral;   EYE SURGERY Bilateral    cataract   INSERTION OF MESH N/A 06/11/2017   Procedure: INSERTION OF MESH;  Surgeon: Ebbie Cough, MD;  Location: [facility] OR;  Service: General;  Laterality: N/A;  BILATERAL TAP BLOCK   IR REMOVAL TUN ACCESS W/ PORT W/O FL MOD SED  11/09/2020   TRANSURETHRAL RESECTION OF BLADDER TUMOR N/A 04/27/2019   Procedure: TRANSURETHRAL RESECTION OF BLADDER TUMOR (TURBT);  Surgeon: Sherrilee Belvie CROME, MD;  Location: [facility];  Service: Urology;  Laterality: N/A;  1 HR   TRANSURETHRAL RESECTION OF BLADDER TUMOR N/A 06/01/2019   Procedure: TRANSURETHRAL RESECTION OF BLADDER TUMOR (TURBT);  Surgeon: Sherrilee Belvie CROME, MD;  Location: [facility];  Service: Urology;  Laterality: N/A;  1 HR   TRANSURETHRAL RESECTION OF BLADDER TUMOR N/A 10/22/2019   Procedure: TRANSURETHRAL RESECTION OF BLADDER TUMOR (TURBT);  Surgeon: Sherrilee Belvie CROME, MD;  Location: [facility];  Service: Urology;  Laterality: N/A;   TUBAL LIGATION  1970   VENTRAL HERNIA REPAIR N/A 06/11/2017   Procedure: LAPAROSCOPIC VENTRAL HERNIA REPAIR WITH MESH ERAS  PATHWAY;  Surgeon: Ebbie Cough, MD;  Location[facility];  Service: General;  Laterality: N/A;  BILATERAL TAP BLOCK   Family History  Problem Relation Age of Onset   Hypertension Mother    Anxiety disorder Mother    Ovarian cancer Mother 47       lung- smoker, ovarian   Lung cancer Mother  smoker   Arthritis Mother    Hyperlipidemia Mother    Alcohol abuse Father    Diabetes Sister    Hypertension Sister    Ovarian cancer Sister    Arthritis Sister    Depression Sister    Hyperlipidemia Sister    Asthma Sister    Depression Sister    Hyperlipidemia Sister    Breast cancer Sister    Asthma Sister    Depression Sister    Arthritis Brother    COPD Brother    Heart attack Brother    Hyperlipidemia Brother    Stroke Brother    Prostate cancer Brother    Other Maternal Grandmother        pacemaker   Multiple sclerosis Maternal Grandfather        ?   Asthma Paternal Uncle    Allergies as of 06/16/2024       Reactions   Nutrasweet Aspartame [aspartame] Diarrhea   Lisinopril Cough        Medication List        Accurate as of June 16, 2024 11:59 PM. If you have any questions, ask your nurse or doctor.          STOP taking these medications    PARoxetine  20 MG tablet Commonly known as: PAXIL  Stopped by: Charlies Bellini       TAKE these medications    Cholecalciferol 125 MCG (5000 UT) capsule Take 5000 units by oral route.   fexofenadine 180 MG tablet Commonly known as: Allegra Allergy Take 1 tablet (180 mg total) by mouth daily. Started by: Charlies Bellini   ipratropium 0.06 % nasal spray Commonly known as: ATROVENT Place 2 sprays into both nostrils 4 (four) times daily. Started by: Maanasa Aderhold   LORazepam  0.5 MG tablet Commonly known as: ATIVAN  Take 1 tablet (0.5 mg total) by mouth 2 (two) times daily as needed for anxiety.   magnesium 84 MG ( ) Tbcr SR tablet Commonly known as: MAGTAB Take 84 mg by mouth daily.   omeprazole  40  MG capsule Commonly known as: PRILOSEC 1 cap(s) orally 20 minutes before breakfast; Duration: 90 days   traMADol  50 MG tablet Commonly known as: ULTRAM  Take 50 mg by mouth 2 (two) times daily as needed.   VITAMIN D  PO Take 1,000 Int'l Units/1.7m2 by mouth.        All past medical history, surgical history, allergies, family history, immunizations andmedications were updated in the EMR today and reviewed under the history and medication portions of their EMR.     ROS Negative, with the exception of above mentioned in HPI   Objective:  BP 120/74   Pulse 82   Temp 97.9 F (36.6 C)   Wt 154 lb (69.9 kg)   SpO2 98%   BMI 25.63 kg/m  Body mass index is 25.63 kg/m. Physical Exam Vitals and nursing note reviewed.  Constitutional:      General: She is not in acute distress.    Appearance: Normal appearance. She is normal weight. She is not ill-appearing or toxic-appearing.  HENT:     Head: Normocephalic and atraumatic.     Right Ear: Tympanic membrane, ear canal and external ear normal.     Left Ear: Ear canal and external ear normal.     Nose: Congestion and rhinorrhea present.     Mouth/Throat:     Mouth: Mucous membranes are moist.     Pharynx: No oropharyngeal exudate or posterior oropharyngeal erythema.  Comments: Postnasal drip present-mild Eyes:     General: No scleral icterus.       Right eye: No discharge.        Left eye: No discharge.     Extraocular Movements: Extraocular movements intact.     Conjunctiva/sclera: Conjunctivae normal.     Pupils: Pupils are equal, round, and reactive to light.  Cardiovascular:     Rate and Rhythm: Normal rate and regular rhythm.     Heart sounds: No murmur heard. Pulmonary:     Effort: Pulmonary effort is normal.     Breath sounds: Normal breath sounds.  Musculoskeletal:     Cervical back: Neck supple.  Lymphadenopathy:     Cervical: No cervical adenopathy.  Skin:    Findings: No rash.  Neurological:     Mental  Status: She is alert and oriented to person, place, and time. Mental status is at baseline.     Motor: No weakness.     Coordination: Coordination normal.     Gait: Gait normal.  Psychiatric:        Mood and Affect: Mood normal.        Behavior: Behavior normal.        Thought Content: Thought content normal.        Judgment: Judgment normal.     No results found. No results found. No results found for this or any previous visit (from the past 24 hours).  Assessment/Plan: SAHVANNAH RIESER is a 83 y.o. female present for OV for  Elevated glucose (Primary) We discussed her prior glucose readings and A1c readings available for review in the EMR.  Discussed with patient she has been technically a prediabetic in the past, but most recent A1c was excellent at 5.6. Encouraged her to obtain routine exercise and follow a low glycemic index diet - POCT HgB A1C> 5.6 again today.  Reassured patient she is not a diabetic.  Persistent cough for 3 weeks or longer/allergic rhinitis/history of pulmonary embolism/history of bladder cancer Patient does have signs of allergies on exam today, lung exam is within normal limits. Encouraged her to start Allegra daily, I have prescribed this for her. Start Atrovent nasal spray 4 times daily as needed, definitely 1 dose before bed. - DG Chest 2 View; Future> normal Do not suspect recurrence of PE-vital signs are stable and afebrile. Follow-up in 3-4 weeks if symptoms still present would move forward with CT chest.  Reviewed expectations re: course of current medical issues. Discussed self-management of symptoms. Outlined signs and symptoms indicating need for more acute intervention. Patient verbalized understanding and all questions were answered. Patient received an After-Visit Summary.    Orders Placed This Encounter  Procedures   DG Chest 2 View   POCT HgB A1C   No orders of the defined types were placed in this encounter.  Referral Orders   No referral(s) requested today     Note is dictated utilizing voice recognition software. Although note has been proof read prior to signing, occasional typographical errors still can be missed. If any questions arise, please do not hesitate to call for verification.   electronically signed by:  Charlies Bellini, DO  [facility] [place]

## 2024-06-16 NOTE — Patient Instructions (Signed)

## 2024-06-16 NOTE — Telephone Encounter (Signed)
 Please call Christina [name] Her chest x-ray was normal Her A1c was 5.6 which is normal and not even in the prediabetic range.  I have called in a daily antihistamine called Allegra.  It is taken once daily.  It does not cause sedation so it can be taken in the day.  Recommend she start this and take for at least 4 weeks. I have also called in a nasal spray called Atrovent.  This helps decrease nasal drainage.  It can be used up to every 6 hours as needed.   Follow-up in 3-4 weeks .  Please schedule this for her.

## 2024-06-18 ENCOUNTER — Encounter: Payer: Self-pay | Admitting: Family Medicine

## 2024-06-26 DIAGNOSIS — C7911 Secondary malignant neoplasm of bladder: Secondary | ICD-10-CM | POA: Diagnosis not present

## 2024-06-26 DIAGNOSIS — N39498 Other specified urinary incontinence: Secondary | ICD-10-CM | POA: Diagnosis not present

## 2024-06-26 DIAGNOSIS — N401 Enlarged prostate with lower urinary tract symptoms: Secondary | ICD-10-CM | POA: Diagnosis not present

## 2024-06-26 DIAGNOSIS — K9409 Other complications of colostomy: Secondary | ICD-10-CM | POA: Diagnosis not present

## 2024-06-26 DIAGNOSIS — Z936 Other artificial openings of urinary tract status: Secondary | ICD-10-CM | POA: Diagnosis not present

## 2024-07-07 DIAGNOSIS — M542 Cervicalgia: Secondary | ICD-10-CM | POA: Diagnosis not present

## 2024-07-07 DIAGNOSIS — Z79899 Other long term (current) drug therapy: Secondary | ICD-10-CM | POA: Diagnosis not present

## 2024-07-07 DIAGNOSIS — Z5181 Encounter for therapeutic drug level monitoring: Secondary | ICD-10-CM | POA: Diagnosis not present

## 2024-07-07 DIAGNOSIS — M545 Low back pain, unspecified: Secondary | ICD-10-CM | POA: Diagnosis not present

## 2024-07-08 ENCOUNTER — Encounter: Payer: Self-pay | Admitting: Family Medicine

## 2024-07-08 ENCOUNTER — Ambulatory Visit: Admitting: Family Medicine

## 2024-07-08 VITALS — BP 122/74 | HR 67 | Temp 98.0°F | Wt 156.0 lb

## 2024-07-08 DIAGNOSIS — G479 Sleep disorder, unspecified: Secondary | ICD-10-CM | POA: Diagnosis not present

## 2024-07-08 DIAGNOSIS — R053 Chronic cough: Secondary | ICD-10-CM | POA: Diagnosis not present

## 2024-07-08 DIAGNOSIS — Z8551 Personal history of malignant neoplasm of bladder: Secondary | ICD-10-CM | POA: Diagnosis not present

## 2024-07-08 DIAGNOSIS — F418 Other specified anxiety disorders: Secondary | ICD-10-CM | POA: Diagnosis not present

## 2024-07-08 DIAGNOSIS — Z86711 Personal history of pulmonary embolism: Secondary | ICD-10-CM | POA: Diagnosis not present

## 2024-07-08 MED ORDER — MONTELUKAST SODIUM 10 MG PO TABS: 10.0000 mg | ORAL_TABLET | Freq: Every day | ORAL | 1 refills | Status: AC

## 2024-07-08 MED ORDER — LORAZEPAM 0.5 MG PO TABS: 0.5000 mg | ORAL_TABLET | Freq: Two times a day (BID) | ORAL | 5 refills | Status: AC | PRN

## 2024-07-08 NOTE — Progress Notes (Signed)
 Christina [name] , [birth year], 83 y.o., female MRN: [id number] Patient Care Team    Relationship Specialty Notifications Start End  Catherine Charlies LABOR, DO PCP - General Family Medicine  09/02/17   Ebbie Cough, MD Consulting Physician General Surgery  10/05/16   Cleatus Collar, MD Consulting Physician Ophthalmology  10/05/16   Vicenta Sewer, DDS Consulting Physician Dentistry  10/05/16   Kristie Lamprey, MD Consulting Physician Gastroenterology  09/02/17   Timmy Maude JONELLE, MD Medical Oncologist Oncology  05/14/19   Bonner Ade, MD Consulting Physician Physical Medicine and Rehabilitation  01/12/21   Claudene Arthea HERO, DO Consulting Physician Sports Medicine  01/12/21   Elisabeth Valli BIRCH, MD Consulting Physician Urology  10/30/22     Chief Complaint  Patient presents with   Cough     Subjective: Christina [name] is a 83 y.o. Pt presents for an OV to follow-up on persistent cough has been present greater than 6 months. Patient was seen 3 weeks ago with normal chest x-ray.  Started on Allegra and Atrovent nasal spray due to signs of postnasal drip and allergy. Today she reports cough has improved, but PND still present-but better.   Prior note with complaints of lingering cough since the spring.  She reports a hacking cough throughout all of the summer.  She noticed last week it started to improve and no longer a deep hacking cough but still having a mild cough.  She is not taking anything for her cough. She denies fever or chills.  She does endorse feeling a little more fatigued than usual.  She would like to have her A1c repeated today.  She has concerns over becoming a diabetic.  She has had some elevated glucose readings when not fasting, 1 of which was 210 in July.  She has had A1c's that have gone as high as 6.0 in the prediabetic range.  Last A1c was 5.6 in July.  Reviewed recent labs with oncology with elevated LDH and a mildly elevated calcium level of 10.4.     07/08/2024     1:25 PM 02/24/2024    1:39 PM 01/15/2024    2:00 PM 11/01/2023   11:26 AM 04/23/2023   11:03 AM  Depression screen PHQ 2/9  Decreased Interest 0 0 0 1 2  Down, Depressed, Hopeless 0 0 1 1 1   PHQ - 2 Score 0 0 1 2 3   Altered sleeping 0  3 3 3   Tired, decreased energy 0  2 3 3   Change in appetite 0  0 0 0  Feeling bad or failure about yourself  0  0 0 1  Trouble concentrating 0  0 1 3  Moving slowly or fidgety/restless 0  0 1 2  Suicidal thoughts 0  0 0 0  PHQ-9 Score 0  6  10  15    Difficult doing work/chores Not difficult at all  Not difficult at all Somewhat difficult Somewhat difficult     Data saved with a previous flowsheet row definition    Allergies  Allergen Reactions   Nutrasweet Aspartame [Aspartame] Diarrhea   Lisinopril Cough   Social History   Social History Narrative   Retired. Lives alone.    Attended some business college.    Former smoker.    Smoke alarm in the home. Wears seat balt.    Wears dentures.    Feels safe in her relationships.       Past Medical History:  Diagnosis Date  Anal fissure 10/24/2021   Arthritis    Asthma    a little?, no problems in several years   Bladder cancer (HCC)    Bladder tumor    Cervical cancer screening 01/31/2012   Chicken pox as a child   Chronic idiopathic constipation 10/24/2021   Daytime somnolence 02/27/2024   Degenerative tear of acetabular labrum of left hip 02/26/2019   Dehydration 01/19/2020   Depression with anxiety 06/07/2009   Qualifier: Diagnosis of  By: Alvan MD, Catherine     Dermatitis of external ear 07/11/2012   Diverticulosis    Essential hypertension, benign 10/13/2007   Qualifier: Diagnosis of  By: Alvan MD, Catherine     Ganglion cyst 02/26/2019   Right elbow   Hiatal hernia with gastroesophageal reflux 10/26/2010   Qualifier: Diagnosis of  By: Alvan MD, Catherine     Hyperglycemia 06/21/2013   Hyperlipidemia, mixed 10/16/2015   pt unaware   Hypertension    Left hip pain  07/10/2015   Lesion of breast 12/03/2016   benign; resolved   Low back pain 05/29/2015   Measles as a child   Mumps as a child   Osteopenia 01/03/2017   Overweight (BMI 25.0-29.9) 10/07/2008   Qualifier: Diagnosis of  By: Alvan MD, Dorothyann     Palpitations    several years ago, not currently   Pre-diabetes    Psoriasis    ears   Rectal bleeding 10/24/2021   RUQ pain 05/29/2015   Spider veins    Bilateral legs   Spinal stenosis    Tailor's bunionette, left 09/02/2017   Umbilical hernia    Urinary frequency 09/28/2011   Vertigo    Wears dentures    Upper,   Xarelto  use-acquired thrombophilia (HCC) 04/16/2023   Past Surgical History:  Procedure Laterality Date   ABDOMINAL SURGERY  1970's   BREAST BIOPSY Right 1980   BREAST EXCISIONAL BIOPSY   BREAST BIOPSY Left 1970   BREAST EXCISIONAL BIOPSY   BREAST CYST ASPIRATION     CATARACT EXTRACTION, BILATERAL     COLONOSCOPY     CYSTECTOMY     abdomen   CYSTOSCOPY W/ RETROGRADES Bilateral 04/27/2019   Procedure: CYSTOSCOPY WITH RETROGRADE PYELOGRAM;  Surgeon: Sherrilee Belvie CROME, MD;  Location: [facility];  Service: Urology;  Laterality: Bilateral;   EYE SURGERY Bilateral    cataract   INSERTION OF MESH N/A 06/11/2017   Procedure: INSERTION OF MESH;  Surgeon: Ebbie Cough, MD;  Location: [facility] OR;  Service: General;  Laterality: N/A;  BILATERAL TAP BLOCK   IR REMOVAL TUN ACCESS W/ PORT W/O FL MOD SED  11/09/2020   TRANSURETHRAL RESECTION OF BLADDER TUMOR N/A 04/27/2019   Procedure: TRANSURETHRAL RESECTION OF BLADDER TUMOR (TURBT);  Surgeon: Sherrilee Belvie CROME, MD;  Location: [facility];  Service: Urology;  Laterality: N/A;  1 HR   TRANSURETHRAL RESECTION OF BLADDER TUMOR N/A 06/01/2019   Procedure: TRANSURETHRAL RESECTION OF BLADDER TUMOR (TURBT);  Surgeon: Sherrilee Belvie CROME, MD;  Location: [facility];  Service: Urology;  Laterality: N/A;  1 HR   TRANSURETHRAL RESECTION OF  BLADDER TUMOR N/A 10/22/2019   Procedure: TRANSURETHRAL RESECTION OF BLADDER TUMOR (TURBT);  Surgeon: Sherrilee Belvie CROME, MD;  Location: [facility];  Service: Urology;  Laterality: N/A;   TUBAL LIGATION  1970   VENTRAL HERNIA REPAIR N/A 06/11/2017   Procedure: LAPAROSCOPIC VENTRAL HERNIA REPAIR WITH MESH ERAS PATHWAY;  Surgeon: Ebbie Cough, MD;  Location: [facility] OR;  Service: General;  Laterality: N/A;  BILATERAL TAP BLOCK   Family History  Problem Relation Age of Onset   Hypertension Mother    Anxiety disorder Mother    Ovarian cancer Mother 79       lung- smoker, ovarian   Lung cancer Mother        smoker   Arthritis Mother    Hyperlipidemia Mother    Alcohol abuse Father    Diabetes Sister    Hypertension Sister    Ovarian cancer Sister    Arthritis Sister    Depression Sister    Hyperlipidemia Sister    Asthma Sister    Depression Sister    Hyperlipidemia Sister    Breast cancer Sister    Asthma Sister    Depression Sister    Arthritis Brother    COPD Brother    Heart attack Brother    Hyperlipidemia Brother    Stroke Brother    Prostate cancer Brother    Other Maternal Grandmother        pacemaker   Multiple sclerosis Maternal Grandfather        ?   Asthma Paternal Uncle    Allergies as of 07/08/2024       Reactions   Nutrasweet Aspartame [aspartame] Diarrhea   Lisinopril Cough        Medication List        Accurate as of July 08, 2024 11:59 PM. If you have any questions, ask your nurse or doctor.          Cholecalciferol 125 MCG (5000 UT) capsule Take 5000 units by oral route.   fexofenadine 180 MG tablet Commonly known as: Allegra Allergy Take 1 tablet (180 mg total) by mouth daily.   ipratropium 0.06 % nasal spray Commonly known as: ATROVENT Place 2 sprays into both nostrils 4 (four) times daily.   LORazepam  0.5 MG tablet Commonly known as: ATIVAN  Take 1 tablet (0.5 mg total) by mouth 2 (two) times daily as needed  for anxiety.   magnesium 84 MG ( ) Tbcr SR tablet Commonly known as: MAGTAB Take 84 mg by mouth daily.   montelukast 10 MG tablet Commonly known as: SINGULAIR Take 1 tablet (10 mg total) by mouth at bedtime. Started by: Jylian Pappalardo   omeprazole  40 MG capsule Commonly known as: PRILOSEC 1 cap(s) orally 20 minutes before breakfast; Duration: 90 days   traMADol  50 MG tablet Commonly known as: ULTRAM  Take 50 mg by mouth 2 (two) times daily as needed.   VITAMIN D  PO Take 1,000 Int'l Units/1.7m2 by mouth.        All past medical history, surgical history, allergies, family history, immunizations andmedications were updated in the EMR today and reviewed under the history and medication portions of their EMR.     Review of Systems  All other systems reviewed and are negative.  Negative, with the exception of above mentioned in HPI   Objective:  BP 122/74   Pulse 67   Temp 98 F (36.7 C)   Wt 156 lb (70.8 kg)   SpO2 97%   BMI 25.96 kg/m  Body mass index is 25.96 kg/m. Physical Exam Vitals and nursing note reviewed.  Constitutional:      General: She is not in acute distress.    Appearance: Normal appearance. She is normal weight. She is not ill-appearing or toxic-appearing.  HENT:     Head: Normocephalic and atraumatic.     Right Ear: Tympanic membrane, ear canal and  external ear normal.     Left Ear: Ear canal and external ear normal.     Nose: No congestion or rhinorrhea.     Mouth/Throat:     Mouth: Mucous membranes are moist.     Pharynx: No oropharyngeal exudate or posterior oropharyngeal erythema.     Comments: Postnasal drip present-mild Eyes:     General: No scleral icterus.       Right eye: No discharge.        Left eye: No discharge.     Extraocular Movements: Extraocular movements intact.     Conjunctiva/sclera: Conjunctivae normal.     Pupils: Pupils are equal, round, and reactive to light.  Cardiovascular:     Rate and Rhythm: Normal rate and  regular rhythm.     Heart sounds: No murmur heard. Pulmonary:     Effort: Pulmonary effort is normal.     Breath sounds: Normal breath sounds.  Musculoskeletal:     Cervical back: Neck supple.  Lymphadenopathy:     Cervical: No cervical adenopathy.  Skin:    Findings: No rash.  Neurological:     Mental Status: She is alert and oriented to person, place, and time. Mental status is at baseline.     Motor: No weakness.     Coordination: Coordination normal.     Gait: Gait normal.  Psychiatric:        Mood and Affect: Mood normal.        Behavior: Behavior normal.        Thought Content: Thought content normal.        Judgment: Judgment normal.     No results found. No results found. No results found for this or any previous visit (from the past 24 hours).  Assessment/Plan: Christina [name] is a 83 y.o. female present for OV for  Allergies/rhinitis/postnasal drip Cough greatly improved postnasal drip and rhinitis symptoms still present. Start Singulair 10 mg nightly Continue Allegra daily Continue Atrovent nasal spray 4 times daily as needed, definitely 1 dose before bed. - DG Chest 2 View; Future> normal  Sleep disturbance/anxiousness: She has returned to using the Ativan  0.5 mg before bed, sometimes requiring twice a day if anxiousness arises. [place]  controlled substance database reviewed today and appropriate Continue Ativan  0.5 mg twice daily as needed-refills provided today  Reviewed expectations re: course of current medical issues. Discussed self-management of symptoms. Outlined signs and symptoms indicating need for more acute intervention. Patient verbalized understanding and all questions were answered. Patient received an After-Visit Summary.    No orders of the defined types were placed in this encounter.  Meds ordered this encounter  Medications   montelukast (SINGULAIR) 10 MG tablet    Sig: Take 1 tablet (10 mg total) by mouth at bedtime.     Dispense:  90 tablet    Refill:  1   LORazepam  (ATIVAN ) 0.5 MG tablet    Sig: Take 1 tablet (0.5 mg total) by mouth 2 (two) times daily as needed for anxiety.    Dispense:  60 tablet    Refill:  5   Referral Orders  No referral(s) requested today     Note is dictated utilizing voice recognition software. Although note has been proof read prior to signing, occasional typographical errors still can be missed. If any questions arise, please do not hesitate to call for verification.   electronically signed by:  Charlies Bellini, DO  [facility] - [place]

## 2024-07-08 NOTE — Patient Instructions (Addendum)

## 2024-07-14 DIAGNOSIS — M62830 Muscle spasm of back: Secondary | ICD-10-CM | POA: Diagnosis not present

## 2024-07-15 DIAGNOSIS — M542 Cervicalgia: Secondary | ICD-10-CM | POA: Diagnosis not present

## 2024-07-28 ENCOUNTER — Ambulatory Visit: Payer: Self-pay

## 2024-07-28 NOTE — Telephone Encounter (Signed)
 FYI Only or Action Required?: FYI only for provider: appointment scheduled on 12/3.  Patient was last seen in primary care on 07/08/2024 by [name], DO.  Called Nurse Triage reporting Anxiety.  Symptoms began x 2 weeks ago.  Interventions attempted: Prescription medications: Lorazepam .  Symptoms are: gradually worsening.  Triage Disposition: See PCP When Office is Open (Within 3 Days)  Patient/caregiver understands and will follow disposition?: Yes        Copied from CRM #[id number]. Topic: Clinical - Red Word Triage >> Jul 28, 2024 11:10 AM Rea ORN wrote: Red Word that prompted transfer to Nurse Triage: increased anxiety since the passing of Christina [name] who was buried yesterday. Pt stated insurance told Christina to discard Christina anxiety medication due to side effects and she did. Now she doesn't have medications and is having a hard time. Reason for Disposition  MODERATE anxiety (e.g., persistent or frequent anxiety symptoms; interferes with sleep, school, or work)  Answer Assessment - Initial Assessment Questions 1. CONCERN: Did anything happen that prompted you to call today?       Christina brothers funeral was yesterday 12/1  2. ANXIETY SYMPTOMS: Can you describe how you (your loved one; patient) have been feeling? (e.g., tense, restless, panicky, anxious, keyed up, overwhelmed, sense of impending doom).       Loss of a loved one, sadness.   3. ONSET: How long have you been feeling this way? (e.g., hours, days, weeks)     X 2 weeks ago    4. SEVERITY: How would you rate the level of anxiety? (e.g., 0 - 10; or mild, moderate, severe).     Moderate    5. FUNCTIONAL IMPAIRMENT: How have these feelings affected your ability to do daily activities? Have you had more difficulty than usual doing your normal daily activities? (e.g., getting better, same, worse; self-care, school, work, interactions)     Decreased sleep, she can still function day to day.    6.  HISTORY: Have you felt this way before? Have you ever been diagnosed with an anxiety problem in the past? (e.g., generalized anxiety disorder, panic attacks, PTSD). If Yes, ask: How was this problem treated? (e.g., medicines, counseling, etc.)     Yes patient has diagnosis of anxiety and is taking Lorazepam    7. RISK OF HARM - SUICIDAL IDEATION: Do you ever have thoughts of hurting or killing yourself? If Yes, ask:  Do you have these feelings now? Do you have a plan on how you would do this?     No   8. TREATMENT:  What has been done so far to treat this anxiety? (e.g., medicines, relaxation strategies). What has helped?     Medication Lorazepam    9. THERAPIST: Do you have a counselor or therapist? If Yes, ask: What is their name?      No, denies needing one    11. PATIENT SUPPORT: Who is with you now? Who do you live with? Do you have family or friends who you can talk to?         Has strong support system with family and friends     62. OTHER SYMPTOMS: Do you have any other symptoms? (e.g., feeling depressed, trouble concentrating, trouble sleeping, trouble breathing, palpitations or fast heartbeat, chest pain, sweating, nausea, or diarrhea)  trouble sleeping   Patient called in with increased anxiety since the passing of Christina [name] who was buried yesterday. She is taking Lorazepam  in which she stated helps with sleep; not  anxiety. She also stated she received a letter from Christina insurance company advising the to stop a medication, however she is unsure of which medication as she misplaced the latter. Appointment scheduled for evaluation. Patient agrees with plan of care, and will call back if anything changes, or if symptoms worsen.  Protocols used: Anxiety and Panic Attack-A-AH

## 2024-07-29 ENCOUNTER — Encounter: Payer: Self-pay | Admitting: Family Medicine

## 2024-07-29 ENCOUNTER — Ambulatory Visit: Admitting: Family Medicine

## 2024-07-29 VITALS — BP 124/72 | HR 70 | Temp 98.2°F | Wt 151.0 lb

## 2024-07-29 DIAGNOSIS — F418 Other specified anxiety disorders: Secondary | ICD-10-CM | POA: Diagnosis not present

## 2024-07-29 MED ORDER — ESCITALOPRAM OXALATE 10 MG PO TABS: 10.0000 mg | ORAL_TABLET | Freq: Every day | ORAL | 1 refills | Status: DC

## 2024-07-29 NOTE — Progress Notes (Signed)
 Christina [name] , [birth year], 83 y.o., female MRN: [id number] Patient Care Team    Relationship Specialty Notifications Start End  Catherine Charlies LABOR, DO PCP - General Family Medicine  09/02/17   Ebbie Cough, MD Consulting Physician General Surgery  10/05/16   Cleatus Collar, MD Consulting Physician Ophthalmology  10/05/16   Vicenta Sewer, DDS Consulting Physician Dentistry  10/05/16   Kristie Lamprey, MD Consulting Physician Gastroenterology  09/02/17   Timmy Maude JONELLE, MD Medical Oncologist Oncology  05/14/19   Bonner Ade, MD Consulting Physician Physical Medicine and Rehabilitation  01/12/21   Claudene Arthea HERO, DO Consulting Physician Sports Medicine  01/12/21   Elisabeth Valli BIRCH, MD Consulting Physician Urology  10/30/22     Chief Complaint  Patient presents with   Anxiety    Increased anxiety due to family stress.      Subjective: Christina [name] is a 83 y.o. Pt presents for an acute visit for new onset worsening anxiety.   Patient has a history of situational anxiety in which she is prescribed Ativan  0.5 mg twice daily as need.  She had been also prescribed Paxil  20 mg daily in the past but had since discontinued secondary to her receiving a letter from her insurance showing her it was a dangerous medication. She was prescribed Zoloft  75 mg 2016, Lexapro  5 mg 2021-never started.  Patient had reported at one of her visits, that she felt Paxil  worked better for her than the Zoloft . She unfortunately recently had to bury her brother the past week and her sister unexpectedly went on hospice today.  She states she would like to get back on a daily medication to help her with her anxiety. Prior note: Was started on Paxil  by her oncology team at the onset of diagnosis of bladder cancer. She does feel that it has been helpful along with the Ativan  0.5 mg twice daily. She reports she had a provider tell her that Zoloft  is better for her than Paxil  and she wanted to try zoloft . Switch  was made last appt and she feels paxil  was a better fit for her. She does have trouble sleeping some nights. She reports ativan   Mg qhs is usually helpful but sometimes she requires additional coverage. ed.     07/08/2024    1:25 PM 02/24/2024    1:39 PM 01/15/2024    2:00 PM 11/01/2023   11:26 AM 04/23/2023   11:03 AM  Depression screen PHQ 2/9  Decreased Interest 0 0 0 1 2  Down, Depressed, Hopeless 0 0 1 1 1   PHQ - 2 Score 0 0 1 2 3   Altered sleeping 0  3 3 3   Tired, decreased energy 0  2 3 3   Change in appetite 0  0 0 0  Feeling bad or failure about yourself  0  0 0 1  Trouble concentrating 0  0 1 3  Moving slowly or fidgety/restless 0  0 1 2  Suicidal thoughts 0  0 0 0  PHQ-9 Score 0  6  10  15    Difficult doing work/chores Not difficult at all  Not difficult at all Somewhat difficult Somewhat difficult     Data saved with a previous flowsheet row definition    Allergies  Allergen Reactions   Nutrasweet Aspartame [Aspartame] Diarrhea   Lisinopril Cough   Social History   Social History Narrative   Retired. Lives alone.    Attended some business college.  Former smoker.    Smoke alarm in the home. Wears seat balt.    Wears dentures.    Feels safe in her relationships.       Past Medical History:  Diagnosis Date   Anal fissure 10/24/2021   Arthritis    Asthma    a little?, no problems in several years   Bladder cancer (HCC)    Bladder tumor    Cervical cancer screening 01/31/2012   Chicken pox as a child   Chronic idiopathic constipation 10/24/2021   Daytime somnolence 02/27/2024   Degenerative tear of acetabular labrum of left hip 02/26/2019   Dehydration 01/19/2020   Depression with anxiety 06/07/2009   Qualifier: Diagnosis of  By: Alvan MD, Catherine     Dermatitis of external ear 07/11/2012   Diverticulosis    Essential hypertension, benign 10/13/2007   Qualifier: Diagnosis of  By: Alvan MD, Catherine     Ganglion cyst 02/26/2019   Right elbow    Hiatal hernia with gastroesophageal reflux 10/26/2010   Qualifier: Diagnosis of  By: Alvan MD, Catherine     Hyperglycemia 06/21/2013   Hyperlipidemia, mixed 10/16/2015   pt unaware   Hypertension    Left hip pain 07/10/2015   Lesion of breast 12/03/2016   benign; resolved   Low back pain 05/29/2015   Measles as a child   Mumps as a child   Osteopenia 01/03/2017   Overweight (BMI 25.0-29.9) 10/07/2008   Qualifier: Diagnosis of  By: Alvan MD, Dorothyann     Palpitations    several years ago, not currently   Pre-diabetes    Psoriasis    ears   Rectal bleeding 10/24/2021   RUQ pain 05/29/2015   Spider veins    Bilateral legs   Spinal stenosis    Tailor's bunionette, left 09/02/2017   Umbilical hernia    Urinary frequency 09/28/2011   Vertigo    Wears dentures    Upper,   Xarelto  use-acquired thrombophilia (HCC) 04/16/2023   Past Surgical History:  Procedure Laterality Date   ABDOMINAL SURGERY  1970's   BREAST BIOPSY Right 1980   BREAST EXCISIONAL BIOPSY   BREAST BIOPSY Left 1970   BREAST EXCISIONAL BIOPSY   BREAST CYST ASPIRATION     CATARACT EXTRACTION, BILATERAL     COLONOSCOPY     CYSTECTOMY     abdomen   CYSTOSCOPY W/ RETROGRADES Bilateral 04/27/2019   Procedure: CYSTOSCOPY WITH RETROGRADE PYELOGRAM;  Surgeon: Sherrilee Belvie CROME, MD;  Location: [facility];  Service: Urology;  Laterality: Bilateral;   EYE SURGERY Bilateral    cataract   INSERTION OF MESH N/A 06/11/2017   Procedure: INSERTION OF MESH;  Surgeon: Ebbie Cough, MD;  Location: [facility] OR;  Service: General;  Laterality: N/A;  BILATERAL TAP BLOCK   IR REMOVAL TUN ACCESS W/ PORT W/O FL MOD SED  11/09/2020   TRANSURETHRAL RESECTION OF BLADDER TUMOR N/A 04/27/2019   Procedure: TRANSURETHRAL RESECTION OF BLADDER TUMOR (TURBT);  Surgeon: Sherrilee Belvie CROME, MD;  Location: [facility];  Service: Urology;  Laterality: N/A;  1 HR   TRANSURETHRAL RESECTION OF BLADDER TUMOR  N/A 06/01/2019   Procedure: TRANSURETHRAL RESECTION OF BLADDER TUMOR (TURBT);  Surgeon: Sherrilee Belvie CROME, MD;  Location[facility];  Service: Urology;  Laterality: N/A;  1 HR   TRANSURETHRAL RESECTION OF BLADDER TUMOR N/A 10/22/2019   Procedure: TRANSURETHRAL RESECTION OF BLADDER TUMOR (TURBT);  Surgeon: Sherrilee Belvie CROME, MD;  Location: [facility];  Service: Urology;  Laterality: N/A;   TUBAL LIGATION  1970   VENTRAL HERNIA REPAIR N/A 06/11/2017   Procedure: LAPAROSCOPIC VENTRAL HERNIA REPAIR WITH MESH ERAS PATHWAY;  Surgeon: Ebbie Cough, MD;  Location: [facility] OR;  Service: General;  Laterality: N/A;  BILATERAL TAP BLOCK   Family History  Problem Relation Age of Onset   Hypertension Mother    Anxiety disorder Mother    Ovarian cancer Mother 10       lung- smoker, ovarian   Lung cancer Mother        smoker   Arthritis Mother    Hyperlipidemia Mother    Alcohol abuse Father    Diabetes Sister    Hypertension Sister    Ovarian cancer Sister    Arthritis Sister    Depression Sister    Hyperlipidemia Sister    Asthma Sister    Depression Sister    Hyperlipidemia Sister    Breast cancer Sister    Asthma Sister    Depression Sister    Arthritis Brother    COPD Brother    Heart attack Brother    Hyperlipidemia Brother    Stroke Brother    Prostate cancer Brother    Other Maternal Grandmother        pacemaker   Multiple sclerosis Maternal Grandfather        ?   Asthma Paternal Uncle    Allergies as of 07/29/2024       Reactions   Nutrasweet Aspartame [aspartame] Diarrhea   Lisinopril Cough        Medication List        Accurate as of July 29, 2024  2:24 PM. If you have any questions, ask your nurse or doctor.          Cholecalciferol 125 MCG (5000 UT) capsule Take 5000 units by oral route.   escitalopram  10 MG tablet Commonly known as: Lexapro  Take 1 tablet (10 mg total) by mouth daily. Started by: Charlies Bellini    fexofenadine  180 MG tablet Commonly known as: Allegra  Allergy Take 1 tablet (180 mg total) by mouth daily.   ipratropium 0.06 % nasal spray Commonly known as: ATROVENT  Place 2 sprays into both nostrils 4 (four) times daily.   LORazepam  0.5 MG tablet Commonly known as: ATIVAN  Take 1 tablet (0.5 mg total) by mouth 2 (two) times daily as needed for anxiety.   magnesium 84 MG ( ) Tbcr SR tablet Commonly known as: MAGTAB Take 84 mg by mouth daily.   montelukast  10 MG tablet Commonly known as: SINGULAIR  Take 1 tablet (10 mg total) by mouth at bedtime.   omeprazole  40 MG capsule Commonly known as: PRILOSEC 1 cap(s) orally 20 minutes before breakfast; Duration: 90 days   traMADol  50 MG tablet Commonly known as: ULTRAM  Take 50 mg by mouth 2 (two) times daily as needed.   VITAMIN D  PO Take 1,000 Int'l Units/1.7m2 by mouth.        All past medical history, surgical history, allergies, family history, immunizations andmedications were updated in the EMR today and reviewed under the history and medication portions of their EMR.     Review of Systems  Constitutional: Negative.   HENT: Negative.    Eyes: Negative.   Respiratory: Negative.    Cardiovascular: Negative.   Gastrointestinal: Negative.   Genitourinary: Negative.   Musculoskeletal: Negative.   Skin: Negative.   Neurological: Negative.   Endo/Heme/Allergies: Negative.   Psychiatric/Behavioral: Negative.    All other systems  reviewed and are negative.  Negative, with the exception of above mentioned in HPI   Objective:  BP 124/72   Pulse 70   Temp 98.2 F (36.8 C)   Wt 151 lb (68.5 kg)   SpO2 98%   BMI 25.13 kg/m  Body mass index is 25.13 kg/m. Physical Exam Vitals and nursing note reviewed.  Constitutional:      General: She is not in acute distress.    Appearance: Normal appearance. She is normal weight. She is not ill-appearing or toxic-appearing.  HENT:     Head: Normocephalic and atraumatic.   Eyes:     General: No scleral icterus.       Right eye: No discharge.        Left eye: No discharge.     Extraocular Movements: Extraocular movements intact.     Conjunctiva/sclera: Conjunctivae normal.     Pupils: Pupils are equal, round, and reactive to light.  Skin:    Findings: No rash.  Neurological:     Mental Status: She is alert and oriented to person, place, and time. Mental status is at baseline.     Motor: No weakness.     Coordination: Coordination normal.     Gait: Gait normal.  Psychiatric:        Mood and Affect: Mood is anxious. Affect is tearful.        Speech: Speech normal.        Behavior: Behavior normal.        Thought Content: Thought content normal.        Cognition and Memory: Cognition normal.        Judgment: Judgment normal.     No results found. No results found. No results found for this or any previous visit (from the past 24 hours).  Assessment/Plan: Christina [name] is a 83 y.o. female present for OV for  Sleep disturbance/anxiousness: She has returned to using the Ativan  0.5 mg before bed, sometimes requiring twice a day if anxiousness arises. [place]  controlled substance database reviewed today and appropriate Continue Ativan  0.5 mg twice daily as needed-refills provided today Start Lexapro  10 mg daily Follow-up her normal routine scheduled  Reviewed expectations re: course of current medical issues. Discussed self-management of symptoms. Outlined signs and symptoms indicating need for more acute intervention. Patient verbalized understanding and all questions were answered. Patient received an After-Visit Summary.    No orders of the defined types were placed in this encounter.  Meds ordered this encounter  Medications   escitalopram  (LEXAPRO ) 10 MG tablet    Sig: Take 1 tablet (10 mg total) by mouth daily.    Dispense:  90 tablet    Refill:  1   Referral Orders  No referral(s) requested today     Note is  dictated utilizing voice recognition software. Although note has been proof read prior to signing, occasional typographical errors still can be missed. If any questions arise, please do not hesitate to call for verification.   electronically signed by:  Charlies Bellini, DO  [facility] - [place]

## 2024-07-29 NOTE — Patient Instructions (Signed)

## 2024-08-18 ENCOUNTER — Telehealth: Payer: Self-pay

## 2024-08-18 NOTE — Telephone Encounter (Signed)
 CMA spoke with pt, pt found information. No further action needed at this time.

## 2024-08-18 NOTE — Telephone Encounter (Signed)
 Copied from CRM #[id number]. Topic: General - Other >> Aug 18, 2024 10:55 AM Deleta RAMAN wrote: Reason for CRM: patient calling to see what dr. Lawerance she was referred to for neck pain. Please contact patient regarding concerns. Phone call disconnected unable contact

## 2024-09-01 ENCOUNTER — Ambulatory Visit: Admitting: Family Medicine

## 2024-09-01 ENCOUNTER — Encounter: Payer: Self-pay | Admitting: Family Medicine

## 2024-09-01 VITALS — BP 118/68 | HR 89 | Temp 98.1°F | Wt 153.2 lb

## 2024-09-01 DIAGNOSIS — F418 Other specified anxiety disorders: Secondary | ICD-10-CM | POA: Diagnosis not present

## 2024-09-01 DIAGNOSIS — M810 Age-related osteoporosis without current pathological fracture: Secondary | ICD-10-CM | POA: Diagnosis not present

## 2024-09-01 DIAGNOSIS — Z936 Other artificial openings of urinary tract status: Secondary | ICD-10-CM

## 2024-09-01 DIAGNOSIS — E559 Vitamin D deficiency, unspecified: Secondary | ICD-10-CM | POA: Diagnosis not present

## 2024-09-01 DIAGNOSIS — N1831 Chronic kidney disease, stage 3a: Secondary | ICD-10-CM | POA: Diagnosis not present

## 2024-09-01 MED ORDER — ESCITALOPRAM OXALATE 20 MG PO TABS: 20.0000 mg | ORAL_TABLET | Freq: Every day | ORAL | 1 refills | Status: AC

## 2024-09-01 NOTE — Patient Instructions (Addendum)
 Return in about 12 weeks (around 11/24/2024) for Routine chronic condition follow-up.        Great to see you today.  I have refilled the medication(s) we provide.   If labs were collected or images ordered, we will inform you of  results once we have received them and reviewed. We will contact you either by echart message, or telephone call.  Please give ample time to the testing facility, and our office to run,  receive and review results. Please do not call inquiring of results, even if you can see them in your chart. We will contact you as soon as we are able. If it has been over 1 week since the test was completed, and you have not yet heard from us , then please call us .    - echart message- for normal results that have been seen by the patient already.   - telephone call: abnormal results or if patient has not viewed results in their echart.  If a referral to a specialist was entered for you, please call us  in 2 weeks if you have not heard from the specialist office to schedule.

## 2024-09-01 NOTE — Progress Notes (Signed)
 "     Christina [name] , 10-08-1940, 84 y.o., female MRN: [id number] Patient Care Team    Relationship Specialty Notifications Start End  Catherine Charlies LABOR, DO PCP - General Family Medicine  09/02/17   Ebbie Cough, MD Consulting Physician General Surgery  10/05/16   Cleatus Collar, MD Consulting Physician Ophthalmology  10/05/16   Vicenta Sewer, DDS Consulting Physician Dentistry  10/05/16   Kristie Lamprey, MD Consulting Physician Gastroenterology  09/02/17   Timmy Maude JONELLE, MD Medical Oncologist Oncology  05/14/19   Bonner Ade, MD Consulting Physician Physical Medicine and Rehabilitation  01/12/21   Claudene Arthea HERO, DO Consulting Physician Sports Medicine  01/12/21   Elisabeth Valli BIRCH, MD Consulting Physician Urology  10/30/22     Chief Complaint  Patient presents with   Anxiety     Subjective: Christina [name] is a 84 y.o. Pt presents for an OV for chronic condition management. Medication reconciliation completed today. Past medical history updated with any changes if appropriate.  Anxiety: Patient reports she is taking the Ativan  0.5 mg twice daily.  In addition to is losing her brother-in-law and brother there for the last couple weeks, she also lost her sister a week after her brother.  She reports she lives open okay great deal of loss over the last 1 to 2 months.  She reports she does not feel like the Lexapro  10 mg was helpful enough.  She has discontinued using.  She is having trouble sleeping most nights, if she takes the Ativan  she is able to fall asleep. Prior note: Patient has a history of situational anxiety in which she is prescribed Ativan  0.5 mg twice daily as need.  She had been also prescribed Paxil  20 mg daily in the past but had since discontinued secondary to her receiving a letter from her insurance showing her it was a dangerous medication. She was prescribed Zoloft  75 mg 2016, Lexapro  5 mg 2021-never started.  Patient had reported at one of her visits, that she  felt Paxil  worked better for her than the Zoloft . She unfortunately recently had to bury her brother the past week and her sister unexpectedly went on hospice today.  She states she would like to get back on a daily medication to help her with her anxiety. Prior note: Was started on Paxil  by her oncology team at the onset of diagnosis of bladder cancer. She does feel that it has been helpful along with the Ativan  0.5 mg twice daily. She reports she had a provider tell her that Zoloft  is better for her than Paxil  and she wanted to try zoloft . Switch was made last appt and she feels paxil  was a better fit for her. She does have trouble sleeping some nights. She reports ativan   Mg qhs is usually helpful but sometimes she requires additional coverage. ed.     07/08/2024    1:25 PM 02/24/2024    1:39 PM 01/15/2024    2:00 PM 11/01/2023   11:26 AM 04/23/2023   11:03 AM  Depression screen PHQ 2/9  Decreased Interest 0 0 0 1 2  Down, Depressed, Hopeless 0 0 1 1 1   PHQ - 2 Score 0 0 1 2 3   Altered sleeping 0  3 3 3   Tired, decreased energy 0  2 3 3   Change in appetite 0  0 0 0  Feeling bad or failure about yourself  0  0 0 1  Trouble concentrating 0  0 1 3  Moving slowly or fidgety/restless 0  0 1 2  Suicidal thoughts 0  0 0 0  PHQ-9 Score 0  6  10  15    Difficult doing work/chores Not difficult at all  Not difficult at all Somewhat difficult Somewhat difficult     Data saved with a previous flowsheet row definition    Allergies  Allergen Reactions   Nutrasweet Aspartame [Aspartame] Diarrhea   Lisinopril Cough   Social History   Social History Narrative   Retired. Lives alone.    Attended some business college.    Former smoker.    Smoke alarm in the home. Wears seat balt.    Wears dentures.    Feels safe in her relationships.       Past Medical History:  Diagnosis Date   Anal fissure 10/24/2021   Arthritis    Asthma    a little?, no problems in several years   Bladder cancer  (HCC)    Bladder tumor    Cervical cancer screening 01/31/2012   Chicken pox as a child   Chronic idiopathic constipation 10/24/2021   Daytime somnolence 02/27/2024   Degenerative tear of acetabular labrum of left hip 02/26/2019   Dehydration 01/19/2020   Depression with anxiety 06/07/2009   Qualifier: Diagnosis of  By: Alvan MD, Catherine     Dermatitis of external ear 07/11/2012   Diverticulosis    Essential hypertension, benign 10/13/2007   Qualifier: Diagnosis of  By: Alvan MD, Catherine     Ganglion cyst 02/26/2019   Right elbow   Hiatal hernia with gastroesophageal reflux 10/26/2010   Qualifier: Diagnosis of  By: Alvan MD, Catherine     Hyperglycemia 06/21/2013   Hyperlipidemia, mixed 10/16/2015   pt unaware   Hypertension    Left hip pain 07/10/2015   Lesion of breast 12/03/2016   benign; resolved   Low back pain 05/29/2015   Measles as a child   Mumps as a child   Osteopenia 01/03/2017   Overweight (BMI 25.0-29.9) 10/07/2008   Qualifier: Diagnosis of  By: Alvan MD, Dorothyann     Palpitations    several years ago, not currently   Pre-diabetes    Psoriasis    ears   Rectal bleeding 10/24/2021   RUQ pain 05/29/2015   Slow transit constipation 11/01/2023   Spider veins    Bilateral legs   Spinal stenosis    Tailor's bunionette, left 09/02/2017   Umbilical hernia    Urinary frequency 09/28/2011   Vertigo    Wears dentures    Upper,   Xarelto  use-acquired thrombophilia (HCC) 04/16/2023   Past Surgical History:  Procedure Laterality Date   ABDOMINAL SURGERY  1970's   BREAST BIOPSY Right 1980   BREAST EXCISIONAL BIOPSY   BREAST BIOPSY Left 1970   BREAST EXCISIONAL BIOPSY   BREAST CYST ASPIRATION     CATARACT EXTRACTION, BILATERAL     COLONOSCOPY     CYSTECTOMY     abdomen   CYSTOSCOPY W/ RETROGRADES Bilateral 04/27/2019   Procedure: CYSTOSCOPY WITH RETROGRADE PYELOGRAM;  Surgeon: Sherrilee Belvie CROME, MD;  Location: [facility];   Service: Urology;  Laterality: Bilateral;   EYE SURGERY Bilateral    cataract   INSERTION OF MESH N/A 06/11/2017   Procedure: INSERTION OF MESH;  Surgeon: Ebbie Cough, MD;  Location: [facility] OR;  Service: General;  Laterality: N/A;  BILATERAL TAP BLOCK   IR REMOVAL TUN ACCESS W/ PORT W/O FL MOD SED  11/09/2020   TRANSURETHRAL  RESECTION OF BLADDER TUMOR N/A 04/27/2019   Procedure: TRANSURETHRAL RESECTION OF BLADDER TUMOR (TURBT);  Surgeon: Sherrilee Belvie CROME, MD;  Location: [facility];  Service: Urology;  Laterality: N/A;  1 HR   TRANSURETHRAL RESECTION OF BLADDER TUMOR N/A 06/01/2019   Procedure: TRANSURETHRAL RESECTION OF BLADDER TUMOR (TURBT);  Surgeon: Sherrilee Belvie CROME, MD;  Location: [facility];  Service: Urology;  Laterality: N/A;  1 HR   TRANSURETHRAL RESECTION OF BLADDER TUMOR N/A 10/22/2019   Procedure: TRANSURETHRAL RESECTION OF BLADDER TUMOR (TURBT);  Surgeon: Sherrilee Belvie CROME, MD;  Location: [facility];  Service: Urology;  Laterality: N/A;   TUBAL LIGATION  1970   VENTRAL HERNIA REPAIR N/A 06/11/2017   Procedure: LAPAROSCOPIC VENTRAL HERNIA REPAIR WITH MESH ERAS PATHWAY;  Surgeon: Ebbie Cough, MD;  Location: [facility] OR;  Service: General;  Laterality: N/A;  BILATERAL TAP BLOCK   Family History  Problem Relation Age of Onset   Hypertension Mother    Anxiety disorder Mother    Ovarian cancer Mother 72       lung- smoker, ovarian   Lung cancer Mother        smoker   Arthritis Mother    Hyperlipidemia Mother    Alcohol abuse Father    Diabetes Sister    Hypertension Sister    Ovarian cancer Sister    Arthritis Sister    Depression Sister    Hyperlipidemia Sister    Asthma Sister    Depression Sister    Hyperlipidemia Sister    Breast cancer Sister    Asthma Sister    Depression Sister    Arthritis Brother    COPD Brother    Heart attack Brother    Hyperlipidemia Brother    Stroke Brother    Prostate cancer  Brother    Other Maternal Grandmother        pacemaker   Multiple sclerosis Maternal Grandfather        ?   Asthma Paternal Uncle    Allergies as of 09/01/2024       Reactions   Nutrasweet Aspartame [aspartame] Diarrhea   Lisinopril Cough        Medication List        Accurate as of September 01, 2024 10:40 AM. If you have any questions, ask your nurse or doctor.          Cholecalciferol 125 MCG (5000 UT) capsule Take 5000 units by oral route.   escitalopram  10 MG tablet Commonly known as: Lexapro  Take 1 tablet (10 mg total) by mouth daily.   fexofenadine  180 MG tablet Commonly known as: Allegra  Allergy Take 1 tablet (180 mg total) by mouth daily.   ipratropium 0.06 % nasal spray Commonly known as: ATROVENT  Place 2 sprays into both nostrils 4 (four) times daily.   LORazepam  0.5 MG tablet Commonly known as: ATIVAN  Take 1 tablet (0.5 mg total) by mouth 2 (two) times daily as needed for anxiety.   magnesium 84 MG ( ) Tbcr SR tablet Commonly known as: MAGTAB Take 84 mg by mouth daily.   montelukast  10 MG tablet Commonly known as: SINGULAIR  Take 1 tablet (10 mg total) by mouth at bedtime.   omeprazole  40 MG capsule Commonly known as: PRILOSEC 1 cap(s) orally 20 minutes before breakfast; Duration: 90 days   traMADol  50 MG tablet Commonly known as: ULTRAM  Take 50 mg by mouth 2 (two) times daily as needed.   VITAMIN D  PO Take 1,000 Int'l Units/1.7m2  by mouth.        All past medical history, surgical history, allergies, family history, immunizations andmedications were updated in the EMR today and reviewed under the history and medication portions of their EMR.     Review of Systems  Constitutional: Negative.   HENT: Negative.    Eyes: Negative.   Respiratory: Negative.    Cardiovascular: Negative.   Gastrointestinal: Negative.   Genitourinary: Negative.   Musculoskeletal: Negative.   Skin: Negative.   Neurological: Negative.    Endo/Heme/Allergies: Negative.   Psychiatric/Behavioral: Negative.    All other systems reviewed and are negative.  Negative, with the exception of above mentioned in HPI   Objective:  BP 118/68   Pulse 89   Temp 98.1 F (36.7 C)   Wt 153 lb 3.2 oz (69.5 kg)   SpO2 97%   BMI 25.49 kg/m  Body mass index is 25.49 kg/m. Physical Exam Vitals and nursing note reviewed.  Constitutional:      General: She is not in acute distress.    Appearance: Normal appearance. She is not ill-appearing, toxic-appearing or diaphoretic.  HENT:     Head: Normocephalic and atraumatic.  Eyes:     General: No scleral icterus.       Right eye: No discharge.        Left eye: No discharge.     Extraocular Movements: Extraocular movements intact.     Conjunctiva/sclera: Conjunctivae normal.     Pupils: Pupils are equal, round, and reactive to light.  Cardiovascular:     Rate and Rhythm: Normal rate and regular rhythm.     Heart sounds: No murmur heard. Pulmonary:     Effort: Pulmonary effort is normal. No respiratory distress.     Breath sounds: Normal breath sounds. No wheezing, rhonchi or rales.  Musculoskeletal:     Cervical back: Neck supple.     Right lower leg: No edema.     Left lower leg: No edema.  Skin:    General: Skin is warm.     Findings: No rash.  Neurological:     Mental Status: She is alert and oriented to person, place, and time. Mental status is at baseline.     Motor: No weakness.     Gait: Gait normal.  Psychiatric:        Mood and Affect: Mood normal.        Behavior: Behavior normal.        Thought Content: Thought content normal.        Judgment: Judgment normal.      No results found. No results found. No results found for this or any previous visit (from the past 24 hours).   Assessment/Plan: BRECKYN TROYER is a 84 y.o. female present for OV for  B12 deficiency Continue sublingual b12 daily to maintain levels.   Sleep  disturbance/anxiousness: Coping, still having difficulty with anxiety Cucumber  controlled substance database reviewed today and appropriate Continue Ativan  0.5 mg twice daily as needed-refills provided today Increase Lexapro  to 20 mg daily  Age-related osteoporosis without current pathological fracture/Vitamin D  deficiency Continue vitamin D  supplementation. Bone density UTD 01/01/2024 T-score -2.8  Stage 3a chronic kidney disease (HCC) Stable.  Chronic Renally dose meds when appropriate. Avoid NSAIDs if possible.  Presence of urostomy (HCC) Stable.  Chronic   Reviewed expectations re: course of current medical issues. Discussed self-management of symptoms. Outlined signs and symptoms indicating need for more acute intervention. Patient verbalized understanding and all questions were  answered. Patient received an After-Visit Summary.    No orders of the defined types were placed in this encounter.  No orders of the defined types were placed in this encounter.  Referral Orders  No referral(s) requested today      Note is dictated utilizing voice recognition software. Although note has been proof read prior to signing, occasional typographical errors still can be missed. If any questions arise, please do not hesitate to call for verification.   electronically signed by:  Charlies Bellini, DO  [facility] - [place]    "

## 2024-09-02 ENCOUNTER — Other Ambulatory Visit: Payer: Self-pay | Admitting: Family

## 2024-09-02 DIAGNOSIS — Z8551 Personal history of malignant neoplasm of bladder: Secondary | ICD-10-CM

## 2024-09-07 ENCOUNTER — Telehealth: Payer: Self-pay

## 2024-09-07 NOTE — Telephone Encounter (Signed)
 Communication  Reason for CRM: Patient recently had her dose increased from 10 mg to 20 mg of her Lexapro . Patient read that she should notify her provider if taking tramadol . Patient stated she does take tramadol  but only when her back is bothering her. Would like to know if these two medications have an interaction or if she is okay to take both.   Please advise.

## 2024-09-08 ENCOUNTER — Other Ambulatory Visit: Payer: Self-pay

## 2024-09-08 ENCOUNTER — Inpatient Hospital Stay: Admitting: Family

## 2024-09-08 ENCOUNTER — Inpatient Hospital Stay: Attending: Hematology & Oncology

## 2024-09-08 ENCOUNTER — Encounter: Payer: Self-pay | Admitting: Family

## 2024-09-08 VITALS — BP 131/75 | HR 84 | Temp 97.6°F | Resp 19 | Ht 65.0 in | Wt 149.0 lb

## 2024-09-08 DIAGNOSIS — Z7901 Long term (current) use of anticoagulants: Secondary | ICD-10-CM | POA: Insufficient documentation

## 2024-09-08 DIAGNOSIS — Z8551 Personal history of malignant neoplasm of bladder: Secondary | ICD-10-CM

## 2024-09-08 DIAGNOSIS — C679 Malignant neoplasm of bladder, unspecified: Secondary | ICD-10-CM | POA: Diagnosis present

## 2024-09-08 DIAGNOSIS — G629 Polyneuropathy, unspecified: Secondary | ICD-10-CM | POA: Insufficient documentation

## 2024-09-08 DIAGNOSIS — R053 Chronic cough: Secondary | ICD-10-CM | POA: Diagnosis not present

## 2024-09-08 DIAGNOSIS — Z08 Encounter for follow-up examination after completed treatment for malignant neoplasm: Secondary | ICD-10-CM | POA: Diagnosis not present

## 2024-09-08 DIAGNOSIS — Z86711 Personal history of pulmonary embolism: Secondary | ICD-10-CM | POA: Diagnosis not present

## 2024-09-08 DIAGNOSIS — Z9221 Personal history of antineoplastic chemotherapy: Secondary | ICD-10-CM | POA: Insufficient documentation

## 2024-09-08 LAB — CBC WITH DIFFERENTIAL (CANCER CENTER ONLY)
Abs Immature Granulocytes: 0.02 K/uL (ref 0.00–0.07)
Basophils Absolute: 0.1 K/uL (ref 0.0–0.1)
Basophils Relative: 1 %
Eosinophils Absolute: 0.1 K/uL (ref 0.0–0.5)
Eosinophils Relative: 1 %
HCT: 42.3 % (ref 36.0–46.0)
Hemoglobin: 13.9 g/dL (ref 12.0–15.0)
Immature Granulocytes: 0 %
Lymphocytes Relative: 36 %
Lymphs Abs: 2.6 K/uL (ref 0.7–4.0)
MCH: 32.3 pg (ref 26.0–34.0)
MCHC: 32.9 g/dL (ref 30.0–36.0)
MCV: 98.1 fL (ref 80.0–100.0)
Monocytes Absolute: 0.6 K/uL (ref 0.1–1.0)
Monocytes Relative: 8 %
Neutro Abs: 3.9 K/uL (ref 1.7–7.7)
Neutrophils Relative %: 54 %
Platelet Count: 252 K/uL (ref 150–400)
RBC: 4.31 MIL/uL (ref 3.87–5.11)
RDW: 13.7 % (ref 11.5–15.5)
WBC Count: 7.3 K/uL (ref 4.0–10.5)
nRBC: 0 % (ref 0.0–0.2)

## 2024-09-08 LAB — CMP (CANCER CENTER ONLY)
ALT: 7 U/L (ref 0–44)
AST: 20 U/L (ref 15–41)
Albumin: 4.4 g/dL (ref 3.5–5.0)
Alkaline Phosphatase: 77 U/L (ref 38–126)
Anion gap: 12 (ref 5–15)
BUN: 15 mg/dL (ref 8–23)
CO2: 25 mmol/L (ref 22–32)
Calcium: 10.8 mg/dL — ABNORMAL HIGH (ref 8.9–10.3)
Chloride: 104 mmol/L (ref 98–111)
Creatinine: 1 mg/dL (ref 0.44–1.00)
GFR, Estimated: 56 mL/min — ABNORMAL LOW
Glucose, Bld: 109 mg/dL — ABNORMAL HIGH (ref 70–99)
Potassium: 4.2 mmol/L (ref 3.5–5.1)
Sodium: 140 mmol/L (ref 135–145)
Total Bilirubin: 1 mg/dL (ref 0.0–1.2)
Total Protein: 6.9 g/dL (ref 6.5–8.1)

## 2024-09-08 LAB — LACTATE DEHYDROGENASE: LDH: 285 U/L — ABNORMAL HIGH (ref 105–235)

## 2024-09-08 NOTE — Progress Notes (Unsigned)
 " Hematology and Oncology Follow Up Visit  Christina [name] [id number] 1940/12/30 84 y.o. 09/08/2024   Principle Diagnosis:  Stage IIIB (T1N3M0) invasive urothelial carcinoma of the bladder  Subsegmental blood clot in right lower lung   Current Therapy:        Status post neoadjuvant chemotherapy with ddMVAC Radical cystectomy on 03/03/2020  Xarelto  20 mg p.o. daily-6 months of therapy to start on 06/30/2021 -  changed to Xarelto  10 mg po q day on 12/26/2021 -DC on 12/30/2023   Interim History:  Christina [name] is here today for follow-up. She is doing well but has had a persistent cough with occasional clear phlegm and runny nose.  No fever, chills, n/v, rash, dizziness, SOB, chest pain, palpitations, abdominal pain or changes in bowel or bladder habits.  Urostomy is functioning well.  No blood loss noted.  No adenopathy or lymphedema.  No swelling in her extremities.  Neuropathy in her fingers comes and goes.  No falls or syncope.  Appetite and hydration are good. Weight is stable at 149 lbs.   ECOG Performance Status: 1 - Symptomatic but completely ambulatory  Medications:  Allergies as of 09/08/2024       Reactions   Nutrasweet Aspartame [aspartame] Diarrhea   Lisinopril Cough        Medication List        Accurate as of September 08, 2024  3:12 PM. If you have any questions, ask your nurse or doctor.          Cholecalciferol 125 MCG (5000 UT) capsule Take 5000 units by oral route.   escitalopram  20 MG tablet Commonly known as: Lexapro  Take 1 tablet (20 mg total) by mouth daily.   fexofenadine  180 MG tablet Commonly known as: Allegra  Allergy Take 1 tablet (180 mg total) by mouth daily.   ipratropium 0.06 % nasal spray Commonly known as: ATROVENT  Place 2 sprays into both nostrils 4 (four) times daily.   LORazepam  0.5 MG tablet Commonly known as: ATIVAN  Take 1 tablet (0.5 mg total) by mouth 2 (two) times daily as needed for anxiety.   magnesium 84 MG ( )  Tbcr SR tablet Commonly known as: MAGTAB Take 84 mg by mouth daily.   montelukast  10 MG tablet Commonly known as: SINGULAIR  Take 1 tablet (10 mg total) by mouth at bedtime.   omeprazole  40 MG capsule Commonly known as: PRILOSEC 1 cap(s) orally 20 minutes before breakfast; Duration: 90 days   traMADol  50 MG tablet Commonly known as: ULTRAM  Take 50 mg by mouth 2 (two) times daily as needed.   VITAMIN D  PO Take 1,000 Int'l Units/1.7m2 by mouth.        Allergies: Allergies[1]  Past Medical History, Surgical history, Social history, and Family History were reviewed and updated.  Review of Systems: All other 10 point review of systems is negative.   Physical Exam:  vitals were not taken for this visit.   Wt Readings from Last 3 Encounters:  09/01/24 153 lb 3.2 oz (69.5 kg)  07/29/24 151 lb (68.5 kg)  07/08/24 156 lb (70.8 kg)    Ocular: Sclerae unicteric, pupils equal, round and reactive to light Ear-nose-throat: Oropharynx clear, dentition fair Lymphatic: No cervical or supraclavicular adenopathy Lungs no rales or rhonchi, good excursion bilaterally Heart regular rate and rhythm, no murmur appreciated Abd soft, nontender, positive bowel sounds MSK no focal spinal tenderness, no joint edema Neuro: non-focal, well-oriented, appropriate affect Breasts: Deferred   Lab Results  Component Value Date   WBC  5.6 05/01/2024   HGB 13.3 05/01/2024   HCT 40.3 05/01/2024   MCV 97.1 05/01/2024   PLT 228 05/01/2024   Lab Results  Component Value Date   FERRITIN 97 05/01/2024   IRON 121 05/01/2024   TIBC 304 05/01/2024   UIBC 183 05/01/2024   IRONPCTSAT 40 (H) 05/01/2024   Lab Results  Component Value Date   RETICCTPCT 1.9 10/03/2022   RBC 4.15 05/01/2024   No results found for: KPAFRELGTCHN, LAMBDASER, KAPLAMBRATIO No results found for: IGGSERUM, IGA, IGMSERUM No results found for: STEPHANY CARLOTA BENSON MARKEL EARLA JOANNIE DOC VICK, SPEI   Chemistry      Component Value Date/Time   NA 140 05/01/2024 1503   NA 142 03/04/2017 1016   K 4.6 05/01/2024 1503   CL 104 05/01/2024 1503   CO2 25 05/01/2024 1503   BUN 17 05/01/2024 1503   BUN 13 03/04/2017 1016   CREATININE 1.14 (H) 05/01/2024 1503   CREATININE 0.78 05/20/2015 1704      Component Value Date/Time   CALCIUM 10.4 (H) 05/01/2024 1503   ALKPHOS 81 05/01/2024 1503   AST 27 05/01/2024 1503   ALT 12 05/01/2024 1503   BILITOT 1.1 05/01/2024 1503       Impression and Plan:  Christina [name] is a very pleasant 84 yo caucasian female with history of superficial bladder cancer.  She was treated with intravesicular therapy but was found to have muscle invasive cancer which was poorly differentiated.  She then underwent neoadjuvent chemotherapy followed by radical cystectomy. She was a stage IIIB disease.  With history of PE she completed 2 years of Xarelto .  So far there has been no evidence of recurrent disease or thrombus.  She will be 5 years out from treatment in July of this year.  We will proceed with scans due to cough. She last had in 10/2023.  Follow-up in 6 months.   Lauraine Pepper, NP 1/13/20263:12 PM     [1]  Allergies Allergen Reactions   Nutrasweet Aspartame [Aspartame] Diarrhea   Lisinopril Cough   "

## 2024-09-09 ENCOUNTER — Encounter: Payer: Self-pay | Admitting: Hematology & Oncology

## 2024-09-09 NOTE — Telephone Encounter (Signed)
 Pt called back in for an update. She has not been taking her meds because she still is unsure on what she should do. Please call patient and advise.

## 2024-09-09 NOTE — Telephone Encounter (Signed)
 Yes is okay to take tramadol  and Lexapro  together.

## 2024-09-10 NOTE — Telephone Encounter (Signed)
 Pt aware and verbalized understanding.

## 2024-09-16 ENCOUNTER — Encounter (HOSPITAL_BASED_OUTPATIENT_CLINIC_OR_DEPARTMENT_OTHER): Payer: Self-pay

## 2024-09-16 ENCOUNTER — Ambulatory Visit (HOSPITAL_BASED_OUTPATIENT_CLINIC_OR_DEPARTMENT_OTHER)
Admission: RE | Admit: 2024-09-16 | Discharge: 2024-09-16 | Disposition: A | Discharge status: Home / Self Care | Source: Ambulatory Visit | Attending: Family | Admitting: Family

## 2024-09-16 DIAGNOSIS — I700 Atherosclerosis of aorta: Secondary | ICD-10-CM | POA: Insufficient documentation

## 2024-09-16 DIAGNOSIS — R918 Other nonspecific abnormal finding of lung field: Secondary | ICD-10-CM | POA: Insufficient documentation

## 2024-09-16 DIAGNOSIS — Z936 Other artificial openings of urinary tract status: Secondary | ICD-10-CM | POA: Diagnosis not present

## 2024-09-16 DIAGNOSIS — K5730 Diverticulosis of large intestine without perforation or abscess without bleeding: Secondary | ICD-10-CM | POA: Insufficient documentation

## 2024-09-16 DIAGNOSIS — Z906 Acquired absence of other parts of urinary tract: Secondary | ICD-10-CM | POA: Insufficient documentation

## 2024-09-16 DIAGNOSIS — R590 Localized enlarged lymph nodes: Secondary | ICD-10-CM | POA: Diagnosis not present

## 2024-09-16 DIAGNOSIS — J439 Emphysema, unspecified: Secondary | ICD-10-CM | POA: Insufficient documentation

## 2024-09-16 DIAGNOSIS — Z8551 Personal history of malignant neoplasm of bladder: Secondary | ICD-10-CM | POA: Insufficient documentation

## 2024-09-16 MED ORDER — IOHEXOL 300 MG/ML  SOLN
100.0000 mL | Freq: Once | INTRAMUSCULAR | Status: AC | PRN
  Administered 2024-09-16: 100 mL via INTRAVENOUS

## 2024-09-23 ENCOUNTER — Telehealth: Payer: Self-pay | Admitting: Family

## 2024-09-23 ENCOUNTER — Other Ambulatory Visit: Payer: Self-pay | Admitting: Family

## 2024-09-23 DIAGNOSIS — R590 Localized enlarged lymph nodes: Secondary | ICD-10-CM

## 2024-09-23 DIAGNOSIS — Z8551 Personal history of malignant neoplasm of bladder: Secondary | ICD-10-CM

## 2024-09-23 NOTE — Telephone Encounter (Signed)
 I was able to speak with MS. Christina [name] and go over her CT results and new order for PET scan. She is in agreement with the plan and has no questions at this time. Patient appreciative of call.

## 2024-09-23 NOTE — Telephone Encounter (Signed)
 No answer. Left voicemail on patient's personal phone with call back number to discuss CT result in detail. Order placed for PET.

## 2024-10-19 ENCOUNTER — Other Ambulatory Visit (HOSPITAL_COMMUNITY)

## 2024-11-24 ENCOUNTER — Ambulatory Visit: Admitting: Family Medicine

## 2025-01-20 ENCOUNTER — Encounter

## 2025-03-10 ENCOUNTER — Inpatient Hospital Stay: Admitting: Hematology & Oncology

## 2025-03-10 ENCOUNTER — Inpatient Hospital Stay
